# Patient Record
Sex: Female | Born: 1985 | Race: Black or African American | Hispanic: No | Marital: Single | Smoking: Never smoker
Health system: Southern US, Community
[De-identification: ages and names within clinical notes are randomized; demographics above are authoritative.]

## PROBLEM LIST (undated history)

## (undated) DIAGNOSIS — E119 Type 2 diabetes mellitus without complications: Secondary | ICD-10-CM

## (undated) DIAGNOSIS — O26839 Pregnancy related renal disease, unspecified trimester: Secondary | ICD-10-CM

## (undated) DIAGNOSIS — N289 Disorder of kidney and ureter, unspecified: Secondary | ICD-10-CM

## (undated) DIAGNOSIS — K3184 Gastroparesis: Secondary | ICD-10-CM

## (undated) DIAGNOSIS — I10 Essential (primary) hypertension: Secondary | ICD-10-CM

## (undated) DIAGNOSIS — K5900 Constipation, unspecified: Secondary | ICD-10-CM

## (undated) DIAGNOSIS — N63 Unspecified lump in unspecified breast: Secondary | ICD-10-CM

## (undated) DIAGNOSIS — R1012 Left upper quadrant pain: Secondary | ICD-10-CM

## (undated) DIAGNOSIS — Z3483 Encounter for supervision of other normal pregnancy, third trimester: Secondary | ICD-10-CM

## (undated) DIAGNOSIS — O24012 Pre-existing diabetes mellitus, type 1, in pregnancy, second trimester: Secondary | ICD-10-CM

## (undated) DIAGNOSIS — Z302 Encounter for sterilization: Secondary | ICD-10-CM

## (undated) DIAGNOSIS — L7682 Other postprocedural complications of skin and subcutaneous tissue: Secondary | ICD-10-CM

## (undated) DIAGNOSIS — J209 Acute bronchitis, unspecified: Principal | ICD-10-CM

---

## 2005-12-21 NOTE — ED Provider Notes (Signed)
[facility]                      EMERGENCY DEPARTMENT TREATMENT REPORT   NAME:  [name], [name]                        PT. LOCATION:     ER  [phone]   MR #:         BILLING #: [id number]          DOS: 12/21/2005   TIME:   [id number]   cc:    Billey Chang. MARKS, M.D.   Primary Physician:   CHIEF COMPLAINT:  Painful vaginal bumps.   HISTORY OF PRESENT ILLNESS:  This 20 year old black female presents at 1750   complaining of painful bumps in her vagina.  States she has had to have   them drained several times previously; gets them from her diabetes.  She   also has a yeast infection and states she usually gets a pill for that.   She admits she has not seen any regular doctors recently; last saw a doctor   during her pregnancy last year.  Took her blood sugar today prior to   coming; was 94.  She denies abdominal pain, dysuria, chance of pregnancy,   or any other symptoms or injuries.   REVIEW OF SYSTEMS:   CONSTITUTIONAL: No fever, chills, or weight loss.   HEMATOLOGIC/LYMPHATIC: No excessive bruising or lymph node swelling.   RESPIRATORY: No cough, shortness of breath, or wheezing.   GASTROINTESTINAL: No vomiting, diarrhea, or abdominal pain.   GENITOURINARY: No dysuria, frequency, or urgency.   PAST MEDICAL HISTORY:  PAST MEDICAL HISTORY, FAMILY HISTORY, SOCIAL   HISTORY:  All noncontributory, except as above.   ALLERGIES:  None.   PHYSICAL EXAMINATION:   VITAL SIGNS:  BP 142/85, pulse 111, respirations 20, temperature 97.8, pain   8/10 when pushing on the abscess.   GENERAL APPEARANCE:  Patient appears well developed and well nourished.   Appearance and behavior are age and situation appropriate.   GI:  Abdomen soft, nontender, without complaint of pain to palpation.  No   hepatomegaly or splenomegaly.   No abdominal or inguinal masses appreciated by inspection or palpation, no   CVA tenderness or distention.   GU:  Exam done with Sheliah Mends shows a grape-sized abscess between the right    labia majora and labia minora which is exclusively tender and fluctuant.   There is also a resolving abscess on the left labia majora which is   nontender.  There is no surrounding cellulitis.  There is diffuse candida   infection of the perineal, rectal, and vaginal areas.  No spread further   than that.   Judgment appears appropriate.   Recent and remote memory appear to be intact.   PSYCHIATRIC:  Oriented to time, place and person.  Mood and affect   appropriate.   CONTINUATION BY:   DIANA HOULE,   PROCEDURE NOTE:  The labia cleansed with Betadine.  Right inner labia   _____.  There is an abscess right inner labia and left outer labia, both   anesthetized with 1% Xylocaine.  Incision made and purulent drainage   expressed.  The patient still has a cyst left labia.  Small amount of pus   expressed on the left labia.  Packing placed.  Dressing placed.   COURSE IN THE EMERGENCY DEPARTMENT:  The wound was incised and drained; and  she was placed on Bactrim DS, Diflucan, and Vicodin for pain.  She was   given Dr. Dawna Part for follow up and will return if worsening in the meantime.   DIAGNOSES:   1. Incision and drainage vaginal abscesses.   2. Vaginal candidiasis.   3. Non-insulin-dependent diabetes mellitus.   DISPOSITION:  The patient is discharged home in stable condition, with   instructions to follow up with their regular doctor.  They are advised to   return immediately for any worsening or symptoms of concern.   Electronically Signed By:   Imogene Burn, M.D. 12/25/2005 22:45   ____________________________   Imogene Burn, M.D.   My signature above authenticates this document and my orders, the final   diagnosis(es), discharge prescription(s) and instructions in the [facility] record.   st  D:  12/21/2005  T:  12/23/2005  6:13 A   [id number]   st  D:  12/21/2005  T:  12/23/2005  6:13 A   [id number]   DIANA A HOULE, PA-C

## 2006-04-29 NOTE — ED Provider Notes (Signed)
[facility]                      EMERGENCY DEPARTMENT TREATMENT REPORT   NAME:  [name], [name]              PT. LOCATION:    ER  ER08       DOB:  [birth year]                                                                     AGE:  21   MR #:       BILLING #:           DOS: 04/29/2006  TIME: 9:03 P   SEX:  F   96-29-52    [id number]   cc:   Primary Physician:   [facility]   Time seen 2144.   CHIEF COMPLAINT:  Abdominal pain.   HISTORY OF PRESENT ILLNESS:  A very pleasant 21 year old female presents   stating that for the past 5 days she has been having a lower abdominal pain   that radiates to her lower back bilaterally.  She states the pain comes and   goes, but she comes in today because it has been constant all day.  Patient   states that she had a period in March, cannot recall the exact day, she is   sexually active and uses condoms.  She has had no fevers or chills, no   nausea, vomiting, and diarrhea, and no vaginal discharge.  No dysuria or   polyuria.  She has never had pain like this before.  Her gynecologist is   Dr. Doloris Hall.  She did eat dinner.   REVIEW OF SYSTEMS:   CONSTITUTIONAL: No fevers or chills.   EYES: No visual symptoms.   ENT: No sore throat, runny nose or other URI symptoms.   RESPIRATORY:  No cough, shortness of breath, or wheezing.   CARDIOVASCULAR:  No chest pain, chest pressure, or palpitations.   GI:  Lower abdominal cramping for 5 days that comes and goes, but it has   been constant today with no associated nausea, vomiting, or diarrhea.   GENITOURINARY:  No dysuria, frequency, or urgency.   MUSCULOSKELETAL:  Low back pain.   INTEGUMENTARY:  No rashes.   NEUROLOGICAL:  No headaches, sensory or motor symptoms.   PSYCHIATRIC:  No suicidal or homicidal ideation.   PAST MEDICAL HISTORY:  Includes insulin dependent diabetes, abscesses.   SOCIAL HISTORY:  Here alone.  Doesn't smoke or drink.   FAMILY HISTORY:  Noncontributory.    ALLERGIES:  Penicillin.   MEDICATIONS;  Humalog.   PHYSICAL EXAMINATION:   VITAL SIGNS:  Blood pressure 132/80, pulse 99, respirations 18, temperature   98.3, O2 saturation 100% on room air, pain 7/10.   GENERAL APPEARANCE:  The patient appears well developed and well nourished.   Appearance and behavior are age and situation appropriate.   HEENT:  Mouth/Throat:  Surfaces of the pharynx, palate, and tongue are   pink, moist, and without lesions.   NECK:  Supple, symmetrical, trachea midline.   LYMPHATICS:  No cervical or submandibular lymphadenopathy palpated.   RESPIRATORY:  Clear and equal breath sounds.  No respiratory distress,  tachypnea, or accessory muscle use.   HEART:  Regular rate and rhythm.   GI:  Abdomen is soft with discomfort across the lower abdomen.  There is no   rebound or guarding.   GU:  External genitalia without swelling, lesions, or discharge.  No   urethral swelling or discharge.  She does have some discharge to the   vaginal vault.  She does complain of pain on cervical motion.  There is no   adnexal mass or tenderness noted.   MUSCULOSKELETAL:  Stance and gait appear normal.   SKIN:  Warm and dry without rashes.   PSYCHIATRIC:  Recent and remote memory appear to be intact.   NEUROLOGICAL:  No focal deficits.   ADDENDUM BY NURSE HART   I attempted to contact the patient to make aware that she was positive for   chlamydia although treated during her emergency room visit with   doxycycline.  Will send certified letter to inform her of her diagnosis and   that she needs to continue the course of medication and to inform her   sexual partner of her diagnosis.   CONTINUED BY DR. Barry Dienes:   EMERGENCY DEPARTMENT COURSE:  The patient remained stable.  ____ ordered.   I had an extensive discussion with the patient regarding her possible   etiology symptoms, a pelvic was performed and recheck of her examination   showed a continued discomfort in the suprapubic region but no McBurney's,    Murphy's, Rovsing sign noted, no rebound, rigidity or guarding.  The   patient was given Rocephin 200 mg IM.   DIAGNOSTICS:  Wet prep had no yeast, no trichomonas.  Culture showed a   positive chlamydia but no neisseria.  Urine sent for diagnostics.   IMPRESSION:  Pelvic pain, probable pelvic inflammatory disease.   DISPOSITION:  The patient discharged home, given Vicodin for discomfort as   well as doxycycline for probable pelvic inflammatory disease, was given   Rocephin IM prior to discharge.   Drink plenty of fluids. Follow up primary care Jaimes Eckert, call in 48 hours   for culture results.  No intercourse until symptoms resolve and cultures   noted negative.  Otherwise return if worsening or further concerns.   Electronically Signed By:   Lawrence Marseilles, M.D. 05/03/2006 18:12   ____________________________   Lawrence Marseilles, M.D.   My signature above authenticates this document and my orders, the final   diagnosis(es), discharge prescription(s) and instructions in the [facility] record.   sl  D:  04/29/2006  T:  04/30/2006  8:39 A   [id number]   Salem Caster, PA-C

## 2006-06-24 NOTE — ED Provider Notes (Signed)
[facility]                      EMERGENCY DEPARTMENT TREATMENT REPORT   NAME:  [name], [name]              PT. LOCATION:    ER  ER46       DOB:  [birth year]                                                                     AGE:  20   MR #:       BILLING #:           DOS: 06/24/2006  TIME:          SEX:  F   08-49-88    [id number]   cc:   CHIEF COMPLAINT:  Right knee pain.   HPI:  The patient states that 1 year ago she injured the right knee.  She   says she sprained the knee and had x-rays done which were negative.  She   said that she has been having problems occasionally since then.  She had   pain a couple weeks ago then she noticed some swelling in between that, and   she has had pain the last couple days.  She had swelling yesterday as well,   but could not get a ride here.  She called out of work.  She has no trauma   to the area.  She denies any twisting injury.  She has not done any   running.  She said it just started swelling up.  The swelling is gone   today.  She has not taken any medication over the counter.  Never followed   up with an orthopedist.   REVIEW OF SYSTEMS:   CONSTITUTIONAL: No fever, chills, or weight loss.   MUSCULOSKELETAL:  As above.   INTEGUMENTARY: No rashes.   NEUROLOGICAL: No headaches, sensory or motor symptoms.   PAST MEDICAL HISTORY:  Diabetes and history of frequent abscesses.   MEDICATIONS:  Humalog.   ALLERGIES:  Penicillin.   PHYSICAL EXAMINATION:   GENERAL APPEARANCE:  Patient appears well developed and well nourished.   Appearance and behavior are age and situation appropriate.  This is a   well-appearing 21 year old.   MUSCULOSKELETAL:  The patient is nontender to any bony portion of the knee.   She has good flexion/extension of the knee and 2+ dorsalis pedis pulses.   Negative McMurray.  No valgus or varus _____ stress or pain.  No joint line   pain.   SKIN:  Warm and dry with no abrasions or lacerations.    NEUROLOGIC:  Normal sensation to light touch.   VITAL SIGNS:  Blood pressure is 141/93, pulse 94, respirations 20, and   temperature 98.  Pulse oximetry is 98% on room air.  Pain is 6/10 to 7/10.    INITIAL ASSESSMENT:  This is a well-appearing 21 year old with right knee   pain.  It has been intermittent for the past couple of months.  We will go   ahead and treat her with conservative management and have her follow up   with orthopedist.   DIAGNOSES:  Evaluation of right knee pain.  DISPOSITION:  The patient discharged home.  Told to rest, ice, and elevate.   Take Naprosyn or Motrin over the counter.  Follow up with the orthopedist   or Dr. Sabino Donovan for persistent pain.  Return for any new concerns.   ATTENDING NOTE: increasing knee pain after a fall last year. tender   medially.  Stable. No effusion.  No other complaints.   Electronically Signed By:   Shanna Cisco, M.D. 06/28/2006 11:23   ____________________________   Shanna Cisco, M.D.   My signature above authenticates this document and my orders, the final   diagnosis(es), discharge prescription(s) and instructions in the [facility] record.   ST  D:  06/24/2006  T:  06/25/2006  6:10 A   [id number]   [name] EADS, PA-C

## 2006-08-24 NOTE — ED Provider Notes (Signed)
[facility]                      EMERGENCY DEPARTMENT TREATMENT REPORT   NAME:  [name], [name]            PT. LOCATION:    ER  ZO10       DOB:  [birth year]                                                                     AGE:  20   MR #:       BILLING #:           DOS: 08/24/2006  TIME:10:03 P   SEX:  F   96-04-54    [id number]   cc:   Primary Physician:   Primary Physician: [facility]   TIME SEEN: 2140.   CHIEF COMPLAINT:  Knee and ankle injury.   HISTORY OF PRESENT ILLNESS:  This is a 21 year old female who last night   said she was running in heels when she slipped, twisted her knee and ankle.   She has had pain to weight bear ever since.  Does relate that her knee has   bothered her off and on for over a year since the fall prior, but has been   worse in the last 24 hours.  She has not had any other injury or complaint   or concern.   REVIEW OF SYSTEMS   CONSTITUTIONAL: No fever or chills.   MUSCULOSKELETAL:  See HPI.   NEUROLOGICAL:  No paresthesias.   PAST MEDICAL HISTORY:  Includes diabetes.   SOCIAL HISTORY:  Is here with a friend.   MEDICATIONS:  Humalog.   ALLERGIES:   Penicillin.   PHYSICAL EXAMINATION:   GENERAL APPEARANCE:  The patient appears well developed and well nourished.   Appearance and behavior are age and situation appropriate.   VITAL SIGNS:  Blood pressure 107/69, pulse 98, respirations 16, temperature   98.9, 100% on room air, 8/10 pain.   HEENT:  Pupils equal, symmetrical and normally reactive.   EXTREMITIES: Lower extremities:  Patient's right knee is without effusion.   Can flex and extend fully, nontender over the patella and lateral joint   line.  The patient complains of some medial soft tissue tenderness.  She is   nontender over the proximal fibula and tibia.    Ankle:  She has tenderness   over the lateral malleolus, nontender over the medial malleolus and   anterior ankle, nontender over the calcaneus and Achilles, nontender over    the calf.  Foot has some tenderness over the proximal and 5th metatarsal   without swelling, nontender over the remainder of the foot dorsally.  She   moves the toes and ankles with full range of motion.  She is distally,   neurovascularly intact.  She is awake, alert and oriented.   INITIAL ASSESSMENT AND MANAGEMENT PLAN: The patient after a fall yesterday   is having pain to her knee, ankle and foot.  At this time, we are going to   obtain some x-rays to rule out any acute abnormality.  The patient denies   pregnancy.   DIAGNOSTIC STUDIES:  Films of the right  ankle, right foot and right knee   were interpreted as negative by Dr. Acquanetta Chain.   FINAL DIAGNOSES:   1.  Right ankle sprain.   2.  Right knee sprain.   DISPOSITION: Home, placed in an air splint and crutches.  Advised follow   up, Motrin, ice, rest and should return for any new or worsening concerns   and follow up as an out patient if not improving in the next week.  The   patient is discharged home in stable condition, with instructions to follow   up with their regular doctor.  They are advised to return immediately for   any worsening or symptoms of concern.   Electronically Signed By:   Acquanetta Chain, M.D. 08/28/2006 21:44   ____________________________   Acquanetta Chain, M.D.   My signature above authenticates this document and my orders, the final   diagnosis(es), discharge prescription(s) and instructions in the [facility] record.   REW  D:  08/24/2006  T:  08/25/2006  3:06 A   [id number]   ERIN E ICENBICE

## 2006-10-15 NOTE — ED Provider Notes (Signed)
[facility]                      EMERGENCY DEPARTMENT TREATMENT REPORT   NAME:  HOLLE, SPRICK           PT. LOCATION:      ER  ER31          DOB:                                                                         AGE:   MR #:      BILLING #:           DOA:  10/15/2006   DOD:              SEX:  F   [birth year]   [id number]   cc:   Primary Physician: [facility]   TIME SEEN:  [phone]   CHIEF COMPLAINT:   Left-sided pain and fever.   HISTORY OF PRESENT ILLNESS:  Rollen Sox is a 21 year old female who is a type 1   diabetic, states that as of 2 or 3 days ago, she began having some ache in   her left side and back, also admits to pain with urination, has not noted   any blood in her urine.  She has been nauseated.  Denies any vomiting.  Has   had normal bowel movements.   REVIEW OF SYSTEMS:   CONSTITUTIONAL:  As per history of present illness.   EYES: No visual symptoms.   ENT: No sore throat, runny nose or other URI symptoms.   ENDOCRINE:  No diabetic symptoms.   RESPIRATORY:  No cough, shortness of breath, or wheezing.   CARDIOVASCULAR:  No chest pain, chest pressure, or palpitations.   GASTROINTESTINAL:  As per history of present illness.   GENITOURINARY:  As per history of present illness.   MUSCULOSKELETAL:  No joint pain or swelling.   INTEGUMENTARY:  No rashes.   NEUROLOGICAL:  No headaches, sensory or motor symptoms.   PAST MEDICAL HISTORY:  Significant for type 1 diabetes and multiple   abscesses.   PAST SURGICAL HISTORY:  C-section.   PSYCHIATRIC HISTORY:  No previous psychiatric history.   SOCIAL HISTORY:  Denies alcohol, tobacco, and drug use.  Lives with others.   FAMILY HISTORY:  Noncontributory.   ALLERGIES:   Penicillin.   MEDICATIONS:   Humalog pump.   PHYSICAL EXAMINATION:   VITAL SIGNS:  Blood pressure 98/66, pulse 116, respirations 20, temperature   99.3, O2 saturation 100% on room air, and pain 9/10.    GENERAL APPEARANCE:  The patient appears well developed and well nourished.   Appearance and behavior are age and situation appropriate.   HEENT: Eyes:  Conjunctivae clear, lids normal.  Pupils equal, symmetrical,   and normally reactive.   RESPIRATORY:  Clear and equal breath sounds.  No respiratory distress,   tachypnea, or accessory muscle use.   CARDIOVASCULAR:  Heart:  Regular without murmurs or rubs.   CHEST:  Chest symmetrical without masses or tenderness.   GASTROINTESTINAL:  Abdomen:  Soft, mildly tender to palpation in the left   mid quadrant.  Nondistended.  No rebound.  Negative Rovsing.  MUSCULOSKELETAL: Stance and gait appear normal. Nails:  No clubbing or   deformities.  Nail beds pink with prompt capillary refill.   SKIN:  Warm and dry without rashes.   NEUROLOGIC:  Alert, oriented.  Sensation intact, motor strength equal and   symmetric.   DIAGNOSTIC STUDIES:  Urinalysis showed greater than 1000 glucose, greater   than 80 ketones, moderate blood, trace leukocyte esterase, and 30-49 white   blood cells per high-powered field.  CBC, which showed a white count of   25.4.  Urine culture is pending.   COURSE IN THE EMERGENCY DEPARTMENT:   The patient received normal saline, 1   liter bolus, and began with IV antibiotics, a gram of Rocephin.   DIAGNOSIS:   Pyelonephritis.   DISPOSITION/PLAN:  The patient is being assigned to the observation unit   for IV antibiotics overnight.   Electronically Signed By:   Stormy Card, M.D. 10/26/2006 22:03   ____________________________   Stormy Card, M.D.   My signature above authenticates this document and my orders, the final   diagnosis(es), discharge prescription(s) and instructions in the [facility] record.   TW  D:  10/15/2006  T:  10/15/2006 10:08 A   [id number]   KARI TOWNS, PA-C

## 2006-10-16 NOTE — H&P (Signed)
[facility]                              HISTORY AND PHYSICAL                               Zachery Dauer, M.D.   NAME:    [name] [name], [name] [name]   MR #:    [id number]                  ADM DATE:          10/15/2006   BILLING  [id number]                 PT. LOCATION       [id number]   #:   SS #     [id number]               DOB:  1985/05/25   AGE:  21   Zachery Dauer, M.D.                 SEX:  F   cc:    Zachery Dauer, M.D.   *[facility]   CHIEF COMPLAINT:  "Left flank pain."   HISTORY OF PRESENT ILLNESS:  This is a 21 year old female with a history of   diabetes mellitus for 3 or 4 years who was placed in the emergency room   observation unit overnight after she was admitted for pyelonephritis.  The   patient was thought to continue to have a fever and still had an elevated   white count and was subsequently admitted for further management.  In   discussion with the patient, the patient reports that she had been having   left flank pain which she describes as a constant, sharp pain that was 9/10   in intensity.  There was no exacerbation or relieving factors.  The patient   had fever and chills.  No hematuria.  She had a decreased appetite but no   nausea or vomiting.  The patient was also having 2-3 times nocturia.  She   reported that she was on an insulin pump but has not been using it since   about 3 or 4 weeks because it was broken and she sent it back to the   manufacturer.   PAST MEDICAL HISTORY:  Diabetes mellitus, insulin dependent for 3 or 4   years.   ALLERGIES:  Penicillin which causes hives.   PAST SURGICAL HISTORY:  C-section x 1.   MEDICATIONS:  As mentioned earlier, the patient was supposed to have an   insulin pump but has not been using it for 3 to 4 weeks.  She does use   Humalog sliding scale.   FAMILY HISTORY:  There is a history of myocardial infarction in the   patient's great-grandmother but no cerebrovascular accident or diabetes    mellitus in any other family members.   SOCIAL HISTORY:  She lives with her grandmother.  She has 1 child who is 19   months.  She completed 1 year of college and currently is in cosmetology   school.  She does not smoke or drink and denies any drug use.   REVIEW OF SYSTEMS:   CONSTITUTIONAL:  No weight loss or gain.   HEENT:  No tinnitus, otalgia, sinus pressure, visual changes, or sore   throat.   NECK:  No stiffness or pain.   RESPIRATORY:  No shortness of breath.   CARDIOVASCULAR:  No chest pain or palpitations.   GASTROINTESTINAL:  No diarrhea, melena, or hematochezia.   GENITOURINARY:  The patient does not have any dysuria or hematuria.  She   reports that her last normal menstrual period ended on September 30, 2006.   NEUROLOGICAL:  No paresthesias or headaches.   MUSCULOSKELETAL:  No muscle weakness or pain.   ENDOCRINE:  No cold or heat intolerance.   PHYSICAL EXAMINATION:   GENERAL:  The patient is resting comfortably in bed.  She is in no acute   distress.   VITAL SIGNS:  Temperature 100.6, pulse 104, respiratory rate 20, blood   pressure 102/50, weight 51 kilograms, height 5 feet 5 inches.   HEENT:  Atraumatic, normocephalic.  Pupils equal, round, and reactive to   light and accommodation.  Extraocular muscles are intact.  Tympanic   membranes are clear.  Oropharynx was clear.   NECK:  Supple, no jugular venous distention.   RESPIRATORY:  Lungs are clear to auscultation bilaterally.   CARDIOVASCULAR:  Normal S1 and S2.  No murmur appreciated.   GASTROINTESTINAL:  Bowel sounds are present, soft, the patient does not   have any guarding or rebound tenderness.  She does have a left   costovertebral angle tenderness.   EXTREMITIES:  No cyanosis or clubbing.  The patient has trace bilateral   lower extremity edema.   NEUROLOGICAL:  Cranial nerves II-XII are grossly intact.  Strength is equal   bilaterally. Reflexes are equal bilaterally.    The patient's labs on admission to the emergency room observation unit:   Sodium 129, potassium 4.1, chloride 97, CO2 19, glucose 470, BUN 10,   creatinine 1.1, calcium 9.2, lipase 49.  WBC 25.4, hemoglobin 12.7,   hematocrit 37.4, platelets 243.  Urinalysis:  Cloudy, greater than 1000   glucose, greater than 80 ketones, moderate blood, trace leukocyte esterase,   32-49 wbc's.  Hemoglobin A1C 12.8.   ASSESSMENT AND PLAN:   1. Pyelonephritis.  The patient was started on Rocephin in the emergency      room.  We will continue that for now.  We will also add Levaquin to her      regimen for now.  We will monitor her temperature curve.  We will also      adequately rehydrate the patient.   2. Uncontrolled diabetes mellitus.  This is purely from noncompliance with      her medications as well as the patient does not have a true physician      management of her diabetes.  She is unable to recall the last time that      she was seen at [facility].  So in discussion with her, I truly      believe that the patient is not serious about her diabetes.  I had      extensive counseling, stressing to her the risks of continuing      noncompliance with her treatment, including loss of limb and subsequent      death due to severely uncontrolled diabetes and possible diabetic      ketoacidosis.  The patient was placed on an insulin drip in the      emergency room which I at this time I do not believe that she will      continue to need that as she does not have any significant anion gap.  I  would like to discontinue the insulin drip and place her on Lantus as      well as cover her with a sliding scale Humalog.  We will change her      Accu-Chek to before meals and at bedtime and monitor her closely.   3. Oral thrush.  We will start an nystatin swish and swallow.   4. Deep venous thrombosis prophylaxis.  We will start Lovenox 40 mg      subcutaneous daily   Electronically Signed By:   Zachery Dauer, M.D. 10/17/2006 10:41    ____________________________   Zachery Dauer, M.D.   CD  D:  10/16/2006  T:  10/16/2006  7:18 P   [id number]

## 2006-10-16 NOTE — ED Provider Notes (Signed)
[facility]                      EMERGENCY DEPARTMENT TREATMENT REPORT   NAME:  [name] [name], [place]             PT. LOCATION:      ERO [id number]          DOB:                                                                         AGE:   MR #:      BILLING #:           DOA:  10/15/2006   DOD:              SEX:  F   08-49-88   [id number]   cc:   Primary Physician:   Date of assignment:  10/15/06 at 1015 hours   Date of admission:  10/16/06 at 1125 hours   SUMMARY:  The patient is a 21 year old insulin dependent diabetic assigned   to the observation unit for treatment with IV insulin, fluids and   antibiotics for pyelonephritis.  Overnight she continue to spike   temperatures.  Repeat evaluation at 1030 hours the patient was still   febrile, a moderate amount of left flank pain, was tolerating liquids.   CLINICAL IMPRESSION:   1.  Pyelonephritis.   2.  Metabolic acidosis.   3.  Intractable pain and fever.   DISPOSITION:  After consulting Dr. Lamar Sprinkles, the patient was admitted to   continue fluids and insulin, Rocephin and add Levaquin.   Electronically Signed By:   Thornton Dales, M.D. 10/20/2006 14:42   ____________________________   Thornton Dales, M.D.   My signature above authenticates this document and my orders, the final   diagnosis(es), discharge prescription(s) and instructions in the [facility] record.   [facility]  D:  10/16/2006  T:  10/16/2006 12:59 P   [id number]

## 2006-10-19 NOTE — Discharge Summary (Signed)
[facility]                                DISCHARGE SUMMARY   NAME:  [name] [name], [name] [name]   MR #:  [id number]                      ADM DATE:   10/15/2006   Tiburcio Bash [id number]                     DIS DATE:     10/19/2006   G#   SS #   [id number]                   DOB:  09/08/1985   Zachery Dauer, M.D.                   AGE:21                   SEX:  F   cc:    Zachery Dauer, M.D.          Karren Burly Doneen Poisson, M.D.   PRINCIPAL DIAGNOSES:   1.     Pyelonephritis.   2.     Uncontrolled diabetes.   3.     Hypomagnesemia.   4.     Oral thrush.   Please see my history and physical for detailed history of present illness   and physical examination.   HISTORY OF PRESENT ILLNESS:  This is a 21 year old female with a history of   diabetes mellitus for 3 to 4 years who was actually placed in the emergency   room observation unit overnight after she was admitted for pyelonephritis.   The patient continued to have fever and elevated white count and was   subsequently admitted for further management.  In discussing with patient,   the patient reports that she had been having left flank pain which she   describes as constant, sharp pain that was 9/10 in intensity.  There were   no exacerbating or alleviating factors.  She had fever and chills.  No   hematuria.  She also has been having decreased appetite but no nausea or   vomiting.  She reports 2 to 3 times nocturia.  She apparently was on   insulin pump, which she has not been using for about 3 or 4 weeks.  She   reports that she sent it back to the manufacturer because it was broken.   PERTINENT LABORATORY FINDINGS:  Blood cultures were negative.  Urine   culture grew out E. coli.  Hemoglobin A1c was 12.8.  On initial admission,   WBC was 25.4, hemoglobin 12.9, hematocrit 37.4, platelet 243,000, sodium   129, potassium 4.1, chloride 97, CO2 19, glucose 417, BUN 10, creatinine   1.1, lipase was 49, and magnesium 1.5.    HOSPITAL COURSE:  She was admitted with pyelonephritis as well as   uncontrolled diabetes mellitus.  At the emergency room, the patient was   started on Rocephin and on admission Levaquin was added.  Culture of her   urine grew out Escherichia coli, which was sensitive to Levaquin.  The   patient also subsequently defervesced and so Rocephin was discontinued and   the patient was continued on oral Levaquin, which she is to complete a 7   day course.   The patient has a uncontrolled diabetes mellitus.  While she was in the   emergency room, she was started on insulin drip, which was immediately   discontinued and the patient was placed on Lantus with significant   improvement in the blood sugar.  I have extensive discussion with the   patient concerning the risk of uncontrolled diabetes.  She voice   understanding.  She was also given information to follow up with Dr. Ann Held as outpatient.  We will continue her on Lantus as well as her   having a sliding scale Humalog.   The patient's magnesium was also low which was replaced.   On examination, the patient had oral thrush, which is likely from   uncontrolled diabetes mellitus.  She was placed on Nystatin swish and   swallow, which she is to complete 5-day course.   The rest of the hospital course was uneventful.  She is being discharged   home in stable condition.  She is to follow up with Dr. Ann Held   preferably in the next 3 or 4 weeks.  She is also to follow up with his   usual care site at [facility] within the next 3 weeks.  She does not   have a specific physician that she sees at [facility], which I have   instructed her to pick a physician that would help her to keep her diabetes   under control.  She is to maintain 1800-calorie ADA diet and activities as   tolerated.   DISCHARGE MEDICATIONS:   1.     Humalog sliding scale.   2.     Lantus 30 units subcutaneous q.a.m.   3.     Levaquin 500 mg daily.    4.     Nystatin swish and swallow 5 mL 4 times a day.   Total time was 27 minutes.   Electronically Signed By:   Zachery Dauer, M.D. 10/24/2006 08:52   ________________________________   Zachery Dauer, M.D.   TS1  D:  10/19/2006  T:  10/19/2006  2:15 P   [id number]

## 2006-12-14 NOTE — ED Provider Notes (Signed)
[facility]                      EMERGENCY DEPARTMENT TREATMENT REPORT   NAME:  [name], [name]           PT. LOCATION:      ER  [phone]          DOB:                                                                         AGE:   MR #:      BILLING #:           DOA:  12/14/2006   DOD:              SEX:  F   08-49-88   [id number]   cc:   Primary Physician:   TIME OF EVALUATION:  1110.   CHIEF COMPLAINT:  Cuts on vaginal area.   HISTORY OF PRESENT ILLNESS:  [name] [name] is a 21 year old female who presents   with severe pain around her vaginal area that she states feels like she has   been cut.  She denies any injury.  Denies any abuse of any kind.  States   the pain began about 2 days ago.  Admits to being sexually active but   states she uses a barrier method every time and also to the fact that she   might have yeast infection.  She has got an abnormal vaginal discharge at   this time.  Denies fever, nausea, vomiting, abdominal pain, or other   complaints.  Denies any previous history of genital herpes.   REVIEW OF SYSTEMS:   CONSTITUTIONAL:  No fever, chills, weight loss.   ENT: No sore throat, runny nose or other URI symptoms.   RESPIRATORY:  No cough, shortness of breath, or wheezing.   CARDIOVASCULAR:  No chest pain, chest pressure, or palpitations.   GASTROINTESTINAL:  No vomiting, diarrhea, or abdominal pain.   GENITOURINARY:  As per history of present illness.   INTEGUMENTARY:  No rashes.   PAST MEDICAL HISTORY:  Significant for type 1 diabetes and abscesses.   PAST SURGICAL HISTORY:  C-section.   PSYCHIATRIC HISTORY:  None.   SOCIAL HISTORY:  Denies alcohol, tobacco, and drug use.   FAMILY HISTORY:  Noncontributory.   ALLERGIES:  Penicillins.   MEDICATIONS:  Humalog.   PHYSICAL EXAMINATION:   VITAL SIGNS:  Blood pressure 136/100, pulse 110, respirations 16,   temperature 98.4, pain 7-8/10.   GENERAL APPEARANCE:  The patient appears well developed and well nourished.    Appearance and behavior are age and situation appropriate.   HEENT:  Mouth/Throat:  Surfaces of the pharynx, palate, and tongue are   pink, moist, and without lesions.   RESPIRATORY:  Clear and equal breath sounds.  No respiratory distress,   tachypnea, or accessory muscle use.   CARDIOVASCULAR:  Heart is regular without murmurs or rubs.   GASTROINTESTINAL:  Abdomen is soft, nondistended, nontender to palpation.   GENITOURINARY:  External genitalia appear to have white salve or powder at   the inferior portion of the vaginal introitus.  There is an ulcerated area.   There do not  appear to be separate discrete lesions at this time, just one   large, ulcerated lesion.  Exquisitely tender to palpation.  The patient   does have a moderate amount of bright red blood in the vaginal vault at   this time, which is consistent with vaginal spotting for her at this time.   Cervical appearance is normal.  No cervical motion tenderness.   SKIN:  Other than the genitalia is warm and dry without rashes.   NEUROLOGIC:  Alert, oriented.  Sensation intact, motor strength equal and   symmetric.   DIAGNOSTIC STUDIES:  STD testing, results of which are pending at this   time, which included herpes culture, RPR and VDRL.  Clinically, the lesions   suggest herpes; but, we are considering syphilis in the diferential as well   since the size of the ulcer is somewhat atypical.   Wet prep showed clue   cells.  Pregnancy test was negative.  Urinalysis showed large blood,   otherwise unremarkable.   DIAGNOSES:   1. Bacterial vaginitis.   2. Evaluation of genital lesions.   DISPOSITION:  The patient is being discharged to home with prescriptions   for Flagyl, Vicodin, and Zovirax.  Instructed to follow up with [facility] Department for further sexually transmitted disease testing.  Return   for any concerns.  The patient is discharged with verbal and written   instructions and a referral for ongoing care.  The patient is aware that    they may return at any time for new or worsening symptoms.   ADDENDUM BY LINDSAY COBURN, PA-C:   The patient seen by Dr. Delton See and Debbora Presto.  Got a call from the   laboratory.  The patient has a positive herpes culture.  I tried to call   the patient and the patient was not home, left a message for them to call   us back   in the ER.   Electronically Signed By:   Marijo Sanes, M.D. 12/17/2006 17:14   ____________________________   Marijo Sanes, M.D.   My signature above authenticates this document and my orders, the final   diagnosis(es), discharge prescription(s) and instructions in the [facility] record.   JJ  D:  12/16/2006  T:  12/16/2006  5:03 P   [id number]   KARI TOWNS, PA-C

## 2006-12-14 NOTE — ED Provider Notes (Signed)
[facility]                      EMERGENCY DEPARTMENT TREATMENT REPORT   NAME:  [name] [name]           PT. LOCATION:      ER  [phone]          DOB:                                                                         AGE:   MR #:      BILLING #:           DOA:  12/14/2006   DOD:              SEX:  F   08-49-88   [id number]   cc:        *[facility]   Primary Physician: [facility]   TIME OF EVALUATION:  1110.   CHIEF COMPLAINT:  Cuts on vaginal area.   HISTORY OF PRESENT ILLNESS:  [name] [name] is a 21 year old female who presents   with severe pain around her vaginal area that she states feels like she has   been cut.  She denies any injury.  Denies any abuse of any kind.  States   the pain began about 2 days ago.  Admits to being sexually active but   states she uses a barrier method every time and also to the fact that she   might have yeast infection.  She has got an abnormal vaginal discharge at   this time.  Denies fever, nausea, vomiting, abdominal pain, or other   complaints.  Denies any previous history of genital herpes.   REVIEW OF SYSTEMS:   CONSTITUTIONAL:  No fever, chills, weight loss.   ENT: No sore throat, runny nose or other URI symptoms.   RESPIRATORY:  No cough, shortness of breath, or wheezing.   CARDIOVASCULAR:  No chest pain, chest pressure, or palpitations.   GASTROINTESTINAL:  No vomiting, diarrhea, or abdominal pain.   GENITOURINARY:  As per history of present illness.   INTEGUMENTARY:  No rashes.   PAST MEDICAL HISTORY:  Significant for type 1 diabetes and abscesses.   PAST SURGICAL HISTORY:  C-section.   PSYCHIATRIC HISTORY:  None.   SOCIAL HISTORY:  Denies alcohol, tobacco, and drug use.   FAMILY HISTORY:  Noncontributory.   ALLERGIES:  Penicillins.   MEDICATIONS:  Humalog.   PHYSICAL EXAMINATION:   VITAL SIGNS:  Blood pressure 136/100, pulse 110, respirations 16,   temperature 98.4, pain 7-8/10.    GENERAL APPEARANCE:  The patient appears well developed and well nourished.   Appearance and behavior are age and situation appropriate.   HEENT:  Mouth/Throat:  Surfaces of the pharynx, palate, and tongue are   pink, moist, and without lesions.   RESPIRATORY:  Clear and equal breath sounds.  No respiratory distress,   tachypnea, or accessory muscle use.   CARDIOVASCULAR:  Heart is regular without murmurs or rubs.   GASTROINTESTINAL:  Abdomen is soft, nondistended, nontender to palpation.   GENITOURINARY:  External genitalia appear to have white salve or powder at   the inferior portion of the vaginal introitus.  There is an ulcerated area.   There do not appear to be separate discrete lesions at this time, just one   large, ulcerated lesion.  Exquisitely tender to palpation.  The patient   does have a moderate amount of bright red blood in the vaginal vault at   this time, which is consistent with vaginal spotting for her at this time.   Cervical appearance is normal.  No cervical motion tenderness.   SKIN:  Other than the genitalia is warm and dry without rashes.   NEUROLOGIC:  Alert, oriented.  Sensation intact, motor strength equal and   symmetric.   DIAGNOSTIC STUDIES:  STD testing, results of which are pending at this   time, which included herpes culture, RPR and VDRL.  Wet prep showed clue   cells.  Pregnancy test was negative.  Urinalysis showed large blood,   otherwise unremarkable.   DIAGNOSES:   1. Bacterial vaginitis.   2. Evaluation of genital lesions.   DISPOSITION:  The patient is being discharged to home with prescriptions   for Flagyl, Vicodin, and Zovirax.  Instructed to follow up with [facility] Department for further sexually transmitted disease testing.  Return   for any concerns.  The patient is discharged with verbal and written   instructions and a referral for ongoing care.  The patient is aware that   they may return at any time for new or worsening symptoms.    Electronically Signed By:   Marijo Sanes, M.D. 12/15/2006 22:42   ____________________________   Marijo Sanes, M.D.   My signature above authenticates this document and my orders, the final   diagnosis(es), discharge prescription(s) and instructions in the [facility] record.   SP1  D:  12/14/2006  T:  12/15/2006  5:45 A   [id number]   KARI TOWNS, PA-C

## 2007-06-02 NOTE — H&P (Signed)
[facility]                              HISTORY AND PHYSICAL                             Janice Coffin, M.D.   NAME:    [name] [name], [name] [name]   MR #:    [id number]                  ADM DATE:          06/04/2007   BILLING  [id number]                 PT. LOCATION   #:   SS #     [id number]               DOB:  [birth year]   AGE:  21   FRED Sima Matas, M.D.             SEX:  F   cc:    Janice Coffin, M.D.   CHIEF COMPLAINT:  "Vaginal bumps"   HISTORY OF PRESENT ILLNESS:  The patient is a 22 year old gravida 1, para 1   currently on combination OCPs, who presents for definitive surgical   management of a condyloma acuminatum of the vulva.  The patient has failed   conservative management using immunogenic topical therapy.  Patient has   been counseled extensively regarding risks, benefits, option of laser   ablation including infection and bleeding, and she agreed to proceed.   PAST MEDICAL HISTORY: Unremarkable.   FAMILY HISTORY: Notable for maternal aunt with diabetes mellitus and grand   maternal cancer of unknown type.   PAST SURGICAL HISTORY:  Noncontributory.   OB/GYN HISTORY:  Age of onset of menarche is approximately 22 years of age   with normal flow q. 20 days of 4-5 days duration.  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   MEDICATIONS: Mircette OCPs.   ALLERGIES:  Penicillin.   REVIEW OF SYSTEMS:  The patient denies any chest pain or shortness of   breath.  She denies any unusual vaginal discharge.  She denies any unusual   bowel problems.   PHYSICAL EXAMINATION:   VITAL SIGNS: Weight 120 pounds, temperature 98.5, blood pressure 100/60,   pulse 80, respirations 20.   HEENT:  Normocephalic, atraumatic.  Extraocular eye motions are full and   intact.   THYROID:  Is without any palpable mass.   NECK: Supple.  Trachea is midline.    LUNGS: Clear to auscultation bilaterally.  Good inspiratory effort.  No   wheezes or crackles noted.   HEART: Rate is normal with normal S1 and S2.  No murmurs appreciated.  No   gallops noted.   BREASTS:  Exam deferred.   ABDOMEN: Soft, nontender, nondistended with normoactive bowel sounds in all   four quadrants.   BACK: Is without costovertebral angle tenderness.   EXTERNAL GENITALIA:  Reveals multiple 1-cm cauliflower lesions at the   posterior fourchette, consistent with condyloma.  Speculum exam revealed   normal appearing cervix without any lesions.  Vaginal mucosa is moist and   well rugated.   EXTREMITIES: Warm and well perfused.  No cyanosis or edema noted.   NEUROLOGIC EXAMINATION:  Patient is alert and oriented x3.  Cranial nerves   II-XII grossly intact.  No focal deficits noted.   IMPRESSION:  Condyloma acuminatum of the vulva.   PLAN: To proceed with laser ablation and routine postop care.   Electronically Signed By:   Janice Coffin, M.D. 06/03/2007 09:14   ____________________________   Janice Coffin, M.D.   JDM  D:  06/02/2007  T:  06/02/2007  6:02 P   [id number]

## 2007-06-03 NOTE — Procedures (Signed)
Test Reason : laser ablation vagina   Blood Pressure : ***/*** mmHG   Vent. Rate : 089 BPM     Atrial Rate : 089 BPM   P-R Int : 132 ms          QRS Dur : 086 ms   QT Int : 358 ms       P-R-T Axes : 082 084 067 degrees   QTc Int : 435 ms   Normal sinus rhythm with sinus arrhythmia   Normal ECG   Early repolarization   No previous ECGs available   Confirmed by Sherral Hammers, M.D., Jomarie Longs [phone]) on 06/03/2007 4:04:15 PM   Referred By:  Loreli Slot           Overread By: Jani Files, M.D.

## 2007-06-03 NOTE — Procedures (Signed)
Test Reason : laser ablation vagina   Blood Pressure : ***/*** mmHG   Vent. Rate : 089 BPM     Atrial Rate : 089 BPM   P-R Int : 132 ms          QRS Dur : 086 ms   QT Int : 358 ms       P-R-T Axes : 082 084 067 degrees   QTc Int : 435 ms   Normal sinus rhythm with sinus arrhythmia   Normal ECG   Early repolarization   No previous ECGs available   Confirmed by Robbins, M.D., Joseph ([id number]) on 06/03/2007 4:04:15 PM   Referred By:  UNK           Overread By: Joseph Robbins, M.D.

## 2007-06-04 NOTE — Op Note (Signed)
[facility]                                OPERATION REPORT                        SURGEON:  FRED A. WILLIAMS, M.D.   NAM [name] [name], [name] [name]   E:   MR  [id number]                         DATE:            06/04/2007   #:   SS  [id number]                      PT. LOCATION:    OR  OR12   #   FRED A. WILLIAMS, M.D.   cc:    FRED A. WILLIAMS, M.D.   PREOPERATIVE DIAGNOSIS:   Vulvar condyloma.   POSTOPERATIVE DIAGNOSIS:   Vulvar condyloma.   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   SURGEON:   Fred A.  Williams, MD   ANESTHESIA:   General endotracheal   ESTIMATED BLOOD LOSS:   Minimal.   FINDINGS:   Multiple condylomatous lesions of the vulva, most notably over the right   labia majora region and some scattered lesions over the left labia minora.   COMPLICATIONS:   None.   INDICATIONS FOR SURGERY:   The patient is 21-year-old who has had a long-standing history of large   condyloma of the vulva who failed topical medical therapy.  The patient   desires management in the form of laser ablation.  The patient was   counseled extensively regarding risks, benefits and options of surgery and   agreed to proceed.   PROCEDURE:   The patient was taken to the operating room where general anesthesia was   found be adequate.  She was placed in dorsal lithotomy position and then   prepped in normal sterile fashion.  A CO2 laser was tested for proper   functioning and all the visible condylomatous lesions were fulgurated.  The   perineum was inspected and noted be hemostatic.  All instruments and lap   counts were correct x2.   DISPOSITION:   The patient tolerated the procedure and was taken to the recovery room in   good condition.   Electronically Signed By:   FRED A. WILLIAMS, M.D. 06/24/2007 09:08   ____________________________   FRED A. WILLIAMS, M.D.   HP  D:  06/19/2007  T:  06/21/2007  5:03 P   [id number]

## 2007-06-19 NOTE — Op Note (Signed)
[facility]                                OPERATION REPORT                        SURGEON:  Janice Coffin, M.D.   [facility], Genella Mech:   MR  [id number]                         DATE:            06/04/2007   #:   [name] [name]  [id number]                      PT. LOCATION:    OR  OR12   #   Janice Coffin, M.D.   cc:    Janice Coffin, M.D.   PREOPERATIVE DIAGNOSIS:   Vulvar condyloma.   POSTOPERATIVE DIAGNOSIS:   Vulvar condyloma.   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   SURGEON:   Fred A.  Mayford Knife, MD   ANESTHESIA:   General endotracheal   ESTIMATED BLOOD LOSS:   Minimal.   FINDINGS:   Multiple condylomatous lesions of the vulva, most notably over the right   labia majora region and some scattered lesions over the left labia minora.   COMPLICATIONS:   None.   INDICATIONS FOR SURGERY:   The patient is 22 year old who has had a long-standing history of large   condyloma of the vulva who failed topical medical therapy.  The patient   desires management in the form of laser ablation.  The patient was   counseled extensively regarding risks, benefits and options of surgery and   agreed to proceed.   PROCEDURE:   The patient was taken to the operating room where general anesthesia was   found be adequate.  She was placed in dorsal lithotomy position and then   prepped in normal sterile fashion.  A CO2 laser was tested for proper   functioning and all the visible condylomatous lesions were fulgurated.  The   perineum was inspected and noted be hemostatic.  All instruments and lap   counts were correct x2.   DISPOSITION:   The patient tolerated the procedure and was taken to the recovery room in   good condition.   Electronically Signed By:   Janice Coffin, M.D. 06/24/2007 09:08   ____________________________   Janice Coffin, M.D.   [name]  D:  06/19/2007  T:  06/21/2007  5:03 P   [id number]

## 2007-11-12 NOTE — ED Provider Notes (Signed)
[name] [place]                      EMERGENCY DEPARTMENT TREATMENT REPORT   NAME:  [name] [name], [name] [name]              PT. LOCATION:    ER  ER03       DOB:  [birth year]                                                                     AGE:  22   MR #:       BILLING #:           DOS: 11/12/2007  TIME: 7:01 A   SEX:  F   [birth year]    [id number]   cc:    Wynell Balloon, M.D.   Primary Physician:   CHIEF COMPLAINT:  Laceration to left middle finger.   HISTORY OF PRESENT ILLNESS:  This is a 22 year old diabetic type 1 female   who comes in with a laceration to her left 3rd finger that she sustained   after cutting apples at work yesterday.  She states that the knife that she   used was serrated and that she did not come in initially because she just   continued to work.  She denies any other alleviating or aggravating   factors.   REVIEW OF SYSTEMS:   CONSTITUTIONAL: No fever, chills, or weight loss.   MUSCULOSKELETAL: No joint pain or swelling.   INTEGUMENTARY:  Laceration to left 3rd finger.   PAST MEDICAL HISTORY:  Diabetes type 1.   SURGICAL HISTORY:  C-section.   SOCIAL HISTORY:  Denies tobacco, alcohol, and drug use.   CURRENT MEDICATIONS:  Listed and reviewed in IBEX.   KNOWN ALLERGIES:  Penicillin.   PHYSICAL EXAMINATION:   VITAL SIGNS:  Blood pressure is 126/82, pulse 116, respirations 18,   temperature 98.7, pain 5, and O2 saturation 99% on room air.   GENERAL APPEARANCE:  Patient appears well developed and well nourished.   Appearance and behavior are age and situation appropriate.   MUSCULOSKELETAL:  The patient has excellent range of motion of her left 3rd   finger.  Sensation is intact.  Radial pulse is 2+.   SKIN:  There appears to be an approximately 1 cm laceration in a U-shaped   fashion on the volar surface of the patient's 3rd finger.  It appears to be   superficial and healing.   INITIAL ASSESSMENT/MANAGEMENT PLAN:  This is a 22 year old diabetic type 1    female with a laceration to her left 3rd finger.  The laceration has   already started to heal.  It has been greater than 12 hours since the   injury so we will not suture it.  However, we will recommend that she keeps   it clean and places bacitracin on it.  We will go ahead and treat her   prophylactically for infection with some Keflex.   CLINICAL IMPRESSION/DIAGNOSIS:  Laceration, left middle finger, no repair.   DISPOSITION/PLAN:  The patient was discharged home in stable condition with   instructions on laceration and general wound care.  Follow up with her   primary care physician if not improving  or return to the ER if condition   worsens or new symptoms develop.   Electronically Signed By:   Jerilynn Som, M.D. 12/02/2007 00:28   ____________________________   Jerilynn Som, M.D.   Dictated by:  Alva Garnet, PA   My signature above authenticates this document and my orders, the final   diagnosis(es), discharge prescription(s) and instructions in the [facility] record.   ST  D:  11/12/2007  T:  11/14/2007  8:21 P   [id number]

## 2008-02-22 NOTE — ED Provider Notes (Signed)
[facility]                      EMERGENCY DEPARTMENT TREATMENT REPORT   NAME:  VELMER, BROADFOOT                      PT. LOCATION:     ER  [phone]   MR #:         BILLING #: [id number]          DOS: 02/22/2008   TIME: 2:14 A   11-91-47   cc:    Wynell Balloon, M.D.   PRIMARY CARE PHYSICIAN   Dr. Darl Pikes Kim-Foley   Time Seen   [phone] on 02/23/2008.   CHIEF COMPLAINTPain from hips down.   HISTORY OF PRESENT ILLNESS   This 23 year old female who is an insulin-dependent diabetic on insulin   pump presents complaining of pain in her legs that begins in her hips and   travels down the entire leg bilaterally x2 weeks with no history of trauma.   No history of back pain.  No bowel or bladder incontinence.  No weakness or   paresthesias.  She states that her blood sugar is typically very well   controlled.  She has no history of any autoimmune disorders in herself or   her family.  She has never been on steroids for any prolonged period of   time.  She did have steroids back in 2007 x2 doses for premature birth of   her baby.   REVIEW OF SYSTEMS   CONSTITUTIONAL:  No fevers, chills, weight loss.   MUSCULOSKELETAL: Bilateral leg pain.   GU:  No incontinence.   NEUROLOGICAL:  No weakness or paresthesias.   PAST MEDICAL HISTORY   Insulin dependent diabetes, C-section.   SOCIAL HISTORY   Here with parent.   FAMILY HISTORY   Noncontributory.   ALLERGIES   Penicillin.   MEDICATIONS   Humalog and Valtrex.   PHYSICAL EXAMINATION   VITAL SIGNS:  Blood pressure 121/71, pulse 96, respiratory rate 16,   temperature 97.9, O2 saturation is 100% on room air.  Pain 9/10.   GENERAL APPEARANCE:  The patient appears well developed and well nourished.   Appearance and behavior are age and situation appropriate.   LOWER EXTREMITIES:  No swelling, ecchymosis, bruising or deformity.  She   has very thin legs.  None of her joints are red, hot or swollen.  Not    particularly tender to touch over the hips, femurs, knees, tibias, fibulas,   or feet.  Pedis pulses are intact.  Strength 5/5 equally and bilaterally.   INITIAL ASSESSMENT AND MANAGEMENT PLAN   A 23 year old female presents with bilateral leg pain.  We will check a   pelvis x-ray to rule out any abnormalities. We will also check an i-STAT.   DIAGNOSTIC STUDIES   The patient's pelvis was read by Dr. Luciano Cutter as no acute abnormalities.  The   patient's i-STAT returned with a low glucose of 41.  She was given a meal   at this time.   DIAGNOSIS   Bilateral leg pain.   PLAN   1. The patient is discharged home in stable condition, with instructions to      follow up with their regular doctor.  They are advised to return      immediately for any worsening or symptoms of concern.   2. The patient is to start taking over-the-counter Motrin  for pain.  May      also take Vicodin.  She was written a prescription for 16.  She may      return if new or worsening symptoms.  She is to follow up with Dr.      Cleda Daub for further evaluation.   Electronically Signed By:   Mickie Kay, M.D. 04/03/2008 00:48   ____________________________   Mickie Kay, M.D.   Dictated by Salem Caster, P.A.   My signature above authenticates this document and my orders, the final   diagnosis(es), discharge prescription(s) and instructions in the [facility] record.   MW  D:  02/23/2008  T:  02/24/2008 11:14 A   [id number]

## 2008-03-02 NOTE — Procedures (Signed)
Test Reason : Chills  Dizziness   Blood Pressure : ***/*** mmHG   Vent. Rate : 115 BPM     Atrial Rate : 115 BPM   P-R Int : 130 ms          QRS Dur : 068 ms   QT Int : 308 ms       P-R-T Axes : 082 078 003 degrees   QTc Int : 426 ms   Sinus tachycardia   Nonspecific T wave abnormality   Early repolarization   Abnormal ECG   When compared with ECG of 03-Jun-2007 15:26,   T wave inversion now evident in Inferior leads   Nonspecific T wave abnormality now evident in Lateral leads   Confirmed by Merilynn Finland, M.D., Scott ([id number]) on 03/03/2008 8:06:33 AM   Referred By: Hildred Priest, MD           Overread By: Marcello Moores, M.D.

## 2008-03-02 NOTE — Procedures (Signed)
Test Reason : Chills  Dizziness   Blood Pressure : ***/*** mmHG   Vent. Rate : 115 BPM     Atrial Rate : 115 BPM   P-R Int : 130 ms          QRS Dur : 068 ms   QT Int : 308 ms       P-R-T Axes : 082 078 003 degrees   QTc Int : 426 ms   Sinus tachycardia   Nonspecific T wave abnormality   Early repolarization   Abnormal ECG   When compared with ECG of 03-Jun-2007 15:26,   T wave inversion now evident in Inferior leads   Nonspecific T wave abnormality now evident in Lateral leads   Confirmed by Robertson, M.D., Scott ([id number]) on 03/03/2008 8:06:33 AM   Referred By: Tim Nelson, MD           Overread By: Scott Robertson, M.D.

## 2008-03-02 NOTE — ED Provider Notes (Signed)
[facility]                      EMERGENCY DEPARTMENT TREATMENT REPORT   NAME:  [name] [name], [name] [name]                      PT. LOCATION:     ER  [phone]   MR #:         BILLING #: [id number]          DOS: 03/02/2008   TIME: 4:22 P   78-46-96   cc:    Wynell Balloon, M.D.   CHIEF COMPLAINT   Nausea, vomiting, diarrhea.   HISTORY OF PRESENT ILLNESS   The patient states that she has been sick for 2 days, not able to tolerate   anything p.o.  She has tried drinking fluids, but has had several episodes   of vomiting per day and a couple episodes of diarrhea per day.  She denies   any abdominal pain.  No fevers.  She has had some chills.  She is diabetic   and has taken her insulin pump off because she has not been eating   anything.  She said she checked her sugar yesterday and it was about 130,   has not checked it today.  She notes she has had a history of C-section,   but no other abdominal surgeries.   REVIEW OF SYSTEMS   CONSTITUTIONAL: Positive for chills.  Negative for fever.   GASTROINTESTINAL: As noted above.   Denies complaints in any other system.   PAST MEDICAL HISTORY   Diabetes.   MEDICATIONS   Humalog, Valtrex.   ALLERGIES   Penicillin.   PHYSICAL EXAMINATION   VITAL SIGNS: Blood pressure 116/70, pulse 132, respirations 20, temperature   97.7.   CONSTITUTIONAL: This is a well-appearing 23 year old female.   HEENT: Head is atraumatic, normocephalic.  ENT:  Mucous membranes appear   dry.   RESPIRATORY:  Clear and equal breath sounds.  No respiratory distress,   tachypnea, or accessory muscle use.   CARDIOVASCULAR: Heart is  tachycardiac with no murmurs, gallops or rubs.   GASTROINTESTINAL:  Abdomen soft, nontender, without complaint of pain to   palpation.  No hepatomegaly or splenomegaly.  Abdomen soft, nontender.   MUSCULOSKELETAL:  Nails:  No clubbing or deformities.  Nail beds pink with   prompt capillary refill.   SKIN:  Warm and dry without rashes.    PSYCHIATRIC: Oriented to time, place and person.  Mood and affect   appropriate.   INITIAL ASSESSMENT AND MANAGEMENT PLAN   This is a 23 year old female with nausea, vomiting, and diarrhea for two   days.  We will go ahead and give her some IV fluids, check an i-STAT and   also dip her urine.   CONTINUATION BY DR. Marijo Sanes   DIAGNOSTIC IMPRESSION   I saw the patient with physician's assistant Ms. Conrad.  The patient   presents with vomiting and diarrhea.  She stopped her insulin pump   yesterday because her sugar was getting low and she was not eating much.   She has continued to vomit today.   Her initial i-STAT shows a glucose of 513 with a bicarbonate of 16, sodium   is appropriately low at 132, BUN 24 and creatinine 0.9.  She looks   dehydrated as well as I am concerned for diabetic ketoacidosis.  -I ordered   a chest x-ray which  is pending at this time.  The patient is not having any   coughing or fevers.   LABORATORY WORK   Demonstrates a white count 15.6, hemoglobin 16.5 and hematocrit 47.7,   platelet counts 288,000 with 89% segs.  Serum acetone is trace.  TSH and   free T4 normal, phosphorus is normal at 4.5, magnesium is normal 2.2.   Sodium is 130, bicarbonate is 16, glucose is 470, BUN 23 and creatinine   1.4.  ABG shows metabolic acidosis with pH of 7.285, pCO2 of 23, pO2 is 109   with bicarbonate of 11.  Saturation 90% on room air.   COURSE IN THE EMERGENCY DEPARTMENT   The patient was started a standard insulin infusion initially and will   continue this as this appears to be mild DKA (diabetic ketoacidosis).  I   have discussed the case with Dr. Chaney Malling, [facility] hospitalist that is   on call this evening.  The patient be admitted to the diabetes floor.   DIAGNOSES   1. Diabetic ketoacidosis.   2. Diarrhea.   3. Vomiting.   PLAN   Continue IV hydration.  Continue insulin drip.   PROCEDURE:  Critical care time including direct patient care, reviewing    medical record, evaluating results of diagnostic testing, discussions with   family members, and consulting with physicians: 40 minutes.   Electronically Signed By:   Marijo Sanes, M.D. 03/03/2008 22:55   ____________________________   Marijo Sanes, M.D.   Dictated by Zara Chess, PA-C   My signature above authenticates this document and my orders, the final   diagnosis(es), discharge prescription(s) and instructions in the [facility] record.   JDM  D:  03/02/2008  T:  03/02/2008  7:42 P   [id number]

## 2008-03-03 NOTE — H&P (Signed)
[facility]                              HISTORY AND PHYSICAL                              [name] [name], M.D.   NAMEJANAVIA, [name]   MR #:    [id number]                  ADM DATE:          03/02/2008   BILLING  [id number]                 PT. LOCATION       [id number]   #:   SS #     [id number]               DOB:  [birth year]   AGE:  22   [name] [name], M.D.              SEX:  F   cc:    [name] [name], M.D.          Wynell Balloon, M.D.   CHIEF COMPLAINT   Nausea and vomiting.   HISTORY OF PRESENT ILLNESS   This is a 23 year old African-American female presented to the emergency   department with complaint of persistent nausea, vomiting lasting   approximately 2-3 days.  She is insulin-dependent diabetic and reports that   because she has had poor oral intake she disconnected her insulin pump 1   day prior to admission.  She reports that since then her symptoms have   worsened.  She presented to the emergency department for evaluation.  Upon   arrival blood pressure was 116/70, pulse 132, respirations 20.  She is   afebrile, O2 saturation was 100% on room air.  Laboratory data was   significant for elevated glucose of 470 associated with an elevated   creatinine of 1.4 and hyponatremia.  The patient also had evidence of the   acidemia with a bicarbonate of 16.  She was diagnosed with diabetic   ketoacidosis, provided copious IV fluid and had insulin.  The patient was   admitted for further evaluation and treatment.   PAST MEDICAL HISTORY   Significant for insulin-dependent diabetes.  She also reports that she has   some bilateral hip pain, has recently had an x-ray, condyloma   SURGICAL HISTORY   Includes a cesarean section.   ALLERGIES   Penicillin.   MEDICATIONS   1. Insulin pump.   2. Valtrex 500 mg p.o. daily.   SOCIAL HISTORY   She denies any tobacco, alcohol, illicit drug use.  She is presently living    with her grandmother.  She has a single child and is unemployed.   FAMILY HISTORY   Significant for diabetes and hypertension as well as coronary artery   disease of her grandparents.  Her parents are in good health as well as a   sibling.   REVIEW OF SYSTEMS   She complains of some lightheadedness, nausea, vomiting.  Denies any   hematemesis.  She has some mild epigastric abdominal tenderness and denies   any diarrhea or constipation.  No heat or cold intolerance, increase in   weight or weight loss.  Otherwise, comprehensive review of systems is   negative.  PHYSICAL EXAMINATION   VITAL SIGNS:  Blood pressure 127/76, pulse 118, respirations 22, O2   saturation 100% on room air.   GENERAL:  She is awake, alert and oriented x3, nontoxic in appearance.  She   is frail.   HEENT:  Pupils equal, round, reactive to light and accommodation.   Extraocular muscles intact.  Pink conjunctive, anicteric sclerae.  Moist   oral mucosa.   NECK:  Supple.  No JVD (jugular venous distention) or lymphadenopathy.   CARDIOVASCULAR:  Regular rate and rhythm.   LUNGS:  Clear to auscultation bilaterally.   GASTROINTESTINAL: Abdomen is soft, nontender, scaphoid.  Positive bowel   sounds x4.   EXTREMITIES:  No cyanosis, clubbing or edema.   NEUROLOGICAL: Cranial nerves II-XII grossly intact.  No focal deficit.   LABORATORY DATA   Sodium 130, potassium 4.3, chloride 96, bicarbonate 16, BUN 23, creatinine   1.4, glucose 470, phosphorus  4.5.  Calcium is 9.8, magnesium 2.2.  She has   trace amount acetone.  CBC:  WBC 15,000, hemoglobin 16 and hematocrit  47,   platelets 288, 89% segs.  Arterial blood gas shows pH 7.28, pCO2 23, pO2   109, bicarbonate 11. ___ studies TSH 0.916 and free T4 is 1.42.   The patient was admitted for further evaluation and treatment.   IMPRESSIONS   1. Diabetic ketoacidosis.   2. Insulin-dependent diabetes.   3. Acute renal failure secondary to nausea and vomiting.   4. Dehydration.   PLAN    The patient will continue with copious IV fluid hydration as well as   insulin.  I have recommend that she restart her insulin.  The patient is   without her insulin pump.  We will continue with the insulin drip in the   interim.  DVT (deep venous thrombosis) and ulcer prophylaxis have also been   implemented.  Thank you for this opportunity to assist in the patient's   management.   Electronically Signed By:   [name] [name], M.D. 03/08/2008 09:57   ____________________________   [name] [name], M.D.   [facility]  D:  03/03/2008  T:  03/03/2008  6:37 P   [id number]

## 2008-03-05 NOTE — Discharge Summary (Signed)
[facility]                                DISCHARGE SUMMARY                              JUDI Army Melia, M.D.   NAME:  [name] [name], [name] [name]   MR #:  [id number]                      ADM DATE:   03/02/2008   Tiburcio Bash [id number]                     DIS DATE:     03/05/2008   G#   SS #   [id number]                   DOB:  [birth year]   Melissa Montane, M.D.                AGE:23                   SEX:  F   cc:   Melissa Montane, M.D.         Wynell Balloon, M.D.   PRIMARY CARE PHYSICIAN   Wynell Balloon, M.D.   DISCHARGE DIAGNOSES   1. Diabetic ketoacidosis.   2. Diabetes mellitus type 2.   3. Dehydration.   4. Acute renal failure.   5. Probable gastroparesis.   PROCEDURES PERFORMED DURING HOSPITALIZATION   None.   HISTORY OF PRESENT ILLNESS   This is a 23 year old African-American female presented to emergency   department with complaint of persistent nausea and vomiting approximately 2   to 3 days prior to admission.  The patient is an insulin dependent diabetic   and reports that when she developed poor oral intake she disconnected her   insulin pump.  She reports that her basal rate is 0.7 units per hour and   she normally provides herself with a sliding scale at mealtime based on her   carbohydrate intake.  Upon arrival to the ED vital signs were stable.   However, laboratory data noted an elevated glucose of 470.  She also had   elevated creatinine of 1.4 and her bicarbonate was 16.  The patient was   provided IV fluids and insulin drip.  She was admitted under the DKA   protocol.  Additional information can be gathered from the history and   physical.   HOSPITAL COURSE   The patient tolerated the IV fluid and her diabetes was controlled within a   24-hour period.  However, she continued to have nausea and vomiting.  I did   add Reglan IV q.a.c. meals and at bedtime and her emesis resolved.  The   patient is now able to tolerate an oral diet.  We also provided her with    subcu Lantus and maintained a sliding scale insulin to manage her blood   sugar because the patient did not have her insulin pump with her.  However,   now she is hemodynamically stable.  Her laboratory data has normalized and   she is prepared for discharge home.  I recommend that she continue with her   insulin as previously prescribed but I recommend that she obtain a gastric   emptying study to rule out the possibility  of gastroparesis before starting   her on Reglan as a maintenance course of medicine.  At this time she is   prepared for discharge home on the following medications: Benzoate 100 mg   p.o. t.i.d., Chloraseptic spray 1 spray to her throat as needed for sore   throat, Pepcid 20 mg b.i.d.  She should continue with her Lispro insulin   pump with sliding scale t.i.d. with meals.  She also continue with   hydrocodone 1 tablet p.o. b.i.d. and Valtrex 500 mg daily.  The patient   should follow up with her primary care physician in 2 to 4 weeks and also   obtain the gastric emptying study with the results forwarded to Dr. Wynell Balloon.   At the time of discharge her vital signs blood pressure was 129/67, pulse   88, respirations 18.  She is afebrile, O2 saturation 99% on room air.   Laboratory data BMP sodium 137, potassium 3.7, chloride 104, bicarbonate   22, BUN 8, creatinine 0.7, glucose 228, calcium is 81.  CBC WBC 7000,   hemoglobin and hematocrit 12 and 35, platelets 186.  Thyroid function   studies were within normal limits and hemoglobin A1c is 7.6.  Fasting blood   glucose today was 267.  The patient also reports that she had an x-ray of   her pelvis in late January.  I reviewed the scans and discussed with   Radiology.  They were normal with no evidence of fracture or arthritic   changes.  Thank you for this opportunity to assist in the patient's   management.   Electronically Signed By:   Melissa Montane, M.D. 03/08/2008 09:57   ____________________________   Melissa Montane, M.D.    LE  D:  03/05/2008  T:  03/07/2008  4:30 P   [id number]

## 2008-03-09 NOTE — ED Provider Notes (Signed)
[facility]                      EMERGENCY DEPARTMENT TREATMENT REPORT   NAME:  [name] [name], [name] [name]                      PT. LOCATION:     ER  ER50   MR #:         BILLING #: [id number]          DOS: 03/08/2008   TIME:12:58 A   [id number]   cc:    Wynell Balloon, M.D.   Primary Physician   Wynell Balloon, MD   CHIEF COMPLAINTS   Throwing up and chest pain for 1 week.   HISTORY OF PRESENT ILLNESS   A 23 year old diabetic female who presents with complaints of epigastric   pain radiating to the upper chest along with nausea and vomiting.  The   patient recently states that a week ago when her symptoms first began she   came to the emergency department for evaluation and treatment and was found   to be in diabetic ketoacidosis, treated for this, and sent home.  She now   presents with complaints of no improvement of her symptoms.  She has pain   that is 9/10, constant, especially with vomiting whether with food or not   eating at all. Her blood glucose has been controlled at 160s.  She denies   any fever or dysuria.   REVIEW OF SYSTEMS   CONSTITUTIONAL:  Denies fever or chills.   RESPIRATORY:  No cough, shortness of breath, or wheezing.   CARDIOVASCULAR:  Chest pain.   GI:  As above.   PAST MEDICAL HISTORY   Diabetes.   PAST SURGICAL HISTORY   C-section.   MEDICATIONS   Humalog and Valtrex.   ALLERGIES   Penicillin.   PHYSICAL EXAMINATION   VITAL SIGNS:  Blood pressure 130/81, pulse 111, respirations 18,   temperature 98.6, pain 9/10, and O2 sat 100% on room air.   GENERAL APPEARANCE:  The patient appears well developed and well nourished.   Appearance and behavior are 23 and situation and situation appropriate.   HEENT:  Eyes:  Conjunctivae clear, lids normal.  Pupils equal, symmetrical,   and normally reactive. Ears/Nose: Hearing is grossly intact to voice.   RESPIRATORY:  Clear and equal breath sounds.  No respiratory distress,   tachypnea, or accessory muscle use.    CARDIOVASCULAR:  Heart regular, without murmurs, gallops, rubs, or thrills.   PMI not displaced.   ABDOMEN:  Soft, nondistended with minimal complaints of palpation on the   epigastrium.  Hyperactive bowel sounds are heard throughout.  No pulsatile   masses.   SKIN:  Warm and dry without rashes.   INITIAL ASSESSMENT/MANAGEMENT PLAN   A 23 year old diabetic female who presents with complaints of nausea and   vomiting especially associated with foods. Prior history of DKA 1 week ago.   At this time, we will order CBC, CMP, lipase, give a bolus of fluids, urine   dip, and urine HCG.  I think this sounds like a gastroparesis in this   patient and when I reviewed prior notes, this is also what the   hospitalist's thought upon discharge that she should have a gastric   emptying.  She  will have a GI cocktail of this time and given Reglan.   CONTINUATION BY DR. Raynald Kemp:   The patient was seen with  Miss Laural Benes.  We have gone over the history and   physical examination together.  The patient is a diabetic who comes in with   nausea and vomiting.  She was evaluated last week with admission for DKA   (diabetic ketoacidosis) and found to have questionable gastric emptying.   She was not discharged on any medications for it because she was supposed   to have workup as an outpatient.  The patient, at this point, will have an   IV established with hydration and medicated.  The patient, on reevaluation,   felt better.  She was able to tolerate orals.  Her labs today look good.   The patient is not in DKA today and she, at this point, actually has an   appointment with her primary care doctor in the morning.  I have given her   a prescription for Reglan, instructed her to follow up with her doctor to   arrange for further testing as an outpatient as planned.   EMERGENCY DEPARTMENT MANAGEMENT   CBC, lipase and LFTs were unremarkable.   FINAL IMPRESSION   1. 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   2. Diabetes, stable.    DISPOSITION   Home.   Electronically Signed By:   Jerilynn Som, M.D. 03/22/2008 22:21   ____________________________   Jerilynn Som, M.D.   Dictated by Nelda Bucks, PA-C   My signature above authenticates this document and my orders, the final   diagnosis(es), discharge prescription(s) and instructions in the [facility] record.   TW  D:  03/09/2008  T:  03/09/2008  3:07 P   [id number]

## 2008-05-02 LAB — URINALYSIS W/ RFLX MICROSCOPIC
Bilirubin: NEGATIVE
Blood: NEGATIVE
Glucose: >1000 MG/DL — AB
Leukocyte Esterase: NEGATIVE
Nitrites: NEGATIVE
Protein: NEGATIVE MG/DL
Specific gravity: <1.005 (ref 1.003–1.030)
Urobilinogen: 0.2 EU/DL (ref 0.2–1.0)
pH (UA): 7 (ref 5.0–8.0)

## 2008-05-02 LAB — CBC WITH AUTOMATED DIFF
ABS. EOSINOPHILS: 0.1 10*3/uL (ref 0.0–0.4)
ABS. LYMPHOCYTES: 3 10*3/uL (ref 0.8–3.5)
ABS. MONOCYTES: 0.4 10*3/uL (ref 0–1.0)
ABS. NEUTROPHILS: 4.3 10*3/uL (ref 1.8–8.0)
BASOPHILS: 2 % (ref 0–3)
EOSINOPHILS: 2 % (ref 0–5)
HCT: 36.1 % (ref 36.0–46.0)
HGB: 12.4 g/dL (ref 12.0–16.0)
LYMPHOCYTES: 38 % (ref 20–51)
MCH: 30.5 PG (ref 25.0–35.0)
MCHC: 34.4 g/dL (ref 31.0–37.0)
MCV: 88.5 FL (ref 78.0–102.0)
MONOCYTES: 5 % (ref 2–9)
MPV: 9.8 FL (ref 7.4–10.4)
NEUTROPHILS: 53 % (ref 42–75)
PLATELET: 227 10*3/uL (ref 130–400)
RBC: 4.08 M/uL — ABNORMAL LOW (ref 4.10–5.10)
RDW: 12.7 % (ref 11.5–14.5)
WBC: 8 10*3/uL (ref 4.5–13.0)

## 2008-05-02 LAB — METABOLIC PANEL, COMPREHENSIVE
A-G Ratio: 0.9 (ref 0.8–1.7)
ALT (SGPT): 32 U/L (ref 30–65)
AST (SGOT): 16 U/L (ref 15–37)
Albumin: 3.7 g/dL (ref 3.4–5.0)
Alk. phosphatase: 123 U/L (ref 50–136)
Anion gap: 5 mmol/L (ref 5–15)
BUN/Creatinine ratio: 5 — ABNORMAL LOW (ref 12–20)
BUN: 6 MG/DL — ABNORMAL LOW (ref 7–18)
Bilirubin, total: 0.4 MG/DL (ref 0.1–0.9)
CO2: 28 MMOL/L (ref 21–32)
Calcium: 9 MG/DL (ref 8.4–10.4)
Chloride: 102 MMOL/L (ref 100–108)
Creatinine: 1.1 MG/DL (ref 0.6–1.3)
GFR est AA: >60 mL/min/{1.73_m2} (ref >60)
GFR est non-AA: >60 mL/min/{1.73_m2} (ref >60)
Globulin: 4.1 g/dL — ABNORMAL HIGH (ref 2.0–4.0)
Glucose: 317 MG/DL — ABNORMAL HIGH (ref 74–99)
Potassium: 3.5 MMOL/L (ref 3.5–5.5)
Protein, total: 7.8 g/dL (ref 6.4–8.2)
Sodium: 135 MMOL/L — ABNORMAL LOW (ref 136–145)

## 2008-05-02 LAB — LIPASE: Lipase: 209 U/L (ref 73–393)

## 2008-05-02 LAB — CKMB PROFILE
CK - MB: <0.5 ng/ml — ABNORMAL LOW (ref 0.5–3.6)
CK: 63 U/L (ref 21–215)

## 2008-05-02 LAB — TROPONIN I: Troponin-I, QT: <0.04 NG/ML (ref 0.00–0.08)

## 2008-05-02 LAB — HCG URINE, QL: HCG urine, QL: NEGATIVE

## 2008-07-14 NOTE — Procedures (Signed)
Test Reason : CP/dizzy   Blood Pressure : ***/*** mmHG   Vent. Rate : 103 BPM     Atrial Rate : 103 BPM   P-R Int : 120 ms          QRS Dur : 072 ms   QT Int : 340 ms       P-R-T Axes : 078 073 061 degrees   QTc Int : 445 ms   Sinus tachycardia   Otherwise normal ECG   When compared with ECG of 02-Mar-2008 17:03,   Nonspecific T wave abnormality has replaced inverted T waves in Inferior lea   Nonspecific T wave abnormality no longer evident in Lateral leads   Confirmed by Tory Emerald, M.D., Maruthi ([id number]) on 07/14/2008 10:30:06 PM   Referred By: Dianna Rossetti, MD           Overread By: Donella Stade, M.D.

## 2008-07-14 NOTE — ED Provider Notes (Signed)
[facility]                      EMERGENCY DEPARTMENT TREATMENT REPORT   NAME:  [name] [name], [name] [name]                SEX:            F   DATE:  07/14/2008                     DOB:            October 07, 1985   MR#    [id number]                       TIME SEEN        2:58 A   ACCT#  [id number]                      ROOM:           ER  JX91   cc:    [name] [name], M.D.   CHIEF COMPLAINT   Throwing up, dizzy.   HISTORY OF PRESENT ILLNESS   This 23 year old female arrived to the emergency department stated that   around 1800-1900 tonight she had fish and maybe 2-3 hours later started   having some abdominal discomfort and started vomiting several times.  Felt   slightly lightheaded after the vomiting and had some chest pain after   vomiting.  Right now she has no chest pain, no lightheadedness, slight   abdominal pain.  No flank pain. No urinary symptoms.   REVIEW OF SYSTEMS   Complaining of fevers, chills, nausea, vomiting.  No urinary symptoms.   Denies complaints in any other system.   PAST MEDICAL HISTORY   Insulin-dependent diabetes mellitus.   FAMILY HISTORY/SOCIAL HISTORY   Negative.   ALLERGIES   Penicillin.   PHYSICAL EXAMINATION   VITAL SIGNS:  Blood pressure 116/70, pulse 118, respirations 18,   temperature 99.0.  Pain scale 8/10. Saturations 99%.   GENERAL APPEARANCE:  The patient appears well developed and well nourished.   Appearance and behavior are age and situation appropriate.   HEENT: Head normocephalic and atraumatic. Eyes:  Conjunctivae clear, lids   normal.  Pupils equal, symmetrical, and normally reactive.   NECK:  Supple, nontender, symmetrical, no masses or JVD, trachea midline.   Thyroid not enlarged, nodular, or tender.   RESPIRATORY:  Clear and equal breath sounds.  No respiratory distress,   tachypnea, or accessory muscle use.   CARDIOVASCULAR: Heart regular rate and rhythm without any murmurs, gallops,   rubs or thrills.    GI:  Complaining of midepigastric pain, right upper quadrant pain.   MUSCULOSKELETAL: Stance and gait appear normal.   SKIN:  Warm and dry without rashes.   NEUROLOGIC:  Alert, oriented.  Sensation intact, motor strength equal and   symmetric.   Will go ahead and get a CBC, CMP and lipase, liter of fluid, Zofran and   reevaluate afterwards.   DIAGNOSTIC STUDIES   An i-STAT:  Sodium 136, glucose 271, CO2 26.  Urine contaminated with   squamous epithelial 15-29, wbc'[name] 30-49, bacteria +3, positive nitrites as   well.   FINAL DIAGNOSES   1. Acute urinary tract infection.   2. Abdominal pain evaluation.   3. Gastroesophageal reflux disease.   4. Vomiting.   DISPOSITION   The patient was placed given Cipro, Pyridium,  Prilosec and Zofran.  The   patient was given a GI cocktail and felt much better afterwards.  The   patient was personally evaluated by myself and Dr. Dianna [name] who agrees   with the above assessment and plan.   ____________________________   [name] [name], M.D.   Dictated By:  [name] Ota, PA-C   My signature above authenticates this document and my orders, the final   diagnosis(es), discharge prescription([name]) and instructions in the [facility] record.   dh  D:  07/14/2008  T:  07/15/2008 11:12 P   [id number]

## 2008-07-14 NOTE — Procedures (Signed)
Test Reason : CP/dizzy   Blood Pressure : ***/*** mmHG   Vent. Rate : 103 BPM     Atrial Rate : 103 BPM   P-R Int : 120 ms          QRS Dur : 072 ms   QT Int : 340 ms       P-R-T Axes : 078 073 061 degrees   QTc Int : 445 ms   Sinus tachycardia   Otherwise normal ECG   When compared with ECG of 02-Mar-2008 17:03,   Nonspecific T wave abnormality has replaced inverted T waves in Inferior lea   Nonspecific T wave abnormality no longer evident in Lateral leads   Confirmed by Gotti, M.D., Maruthi ([id number]) on 07/14/2008 10:30:06 PM   Referred By: Helen Hsu, MD           Overread By: Maruthi Gotti, M.D.

## 2008-10-25 NOTE — ED Provider Notes (Signed)
[facility]                      EMERGENCY DEPARTMENT TREATMENT REPORT   PRELIMINARY (DRAFT) -- FINAL REPORT  in HPF   NAME:  [name] [name], [name] [name]                SEX:            F   DATE:  10/25/2008                     DOB:            1985/02/03   MR#    [id number]                       TIME SEEN        5:40 P   ACCT#  [id number]                      ROOM:           ERO EO03   cc:    Wynell Balloon, M.D.   ER EVALUATION TIME   10/25/2008 at 1740.   CHIEF COMPLAINT   Throwing up, weak, dizziness.   HISTORY OF PRESENT ILLNESS   This is a 23 year old female comes in after vomiting x 2 days.  She had   surgery on her face to remove 2 cysts near her jawline.  She says that she   was taking Tylenol with codeine for pain and then started to vomit.  She   has been throwing up, not able to keep anything down.  She has not had   diarrhea or abdominal pain.  She relates facial discomfort secondary to not   being able to take her pain medicine.  Has not had a fever or chills today.   She was dizzy, so she decided to come in for evaluation.  She has taken   herself off her insulin pump and has not checked her blood sugar today.   REVIEW OF SYSTEMS   CONSTITUTIONAL:  No fever, no chill.   ENT:  See HPI.   RESPIRATORY:  No cough, shortness of breath, or wheezing.   CARDIOVASCULAR:  No chest pain, chest pressure, or palpitations.   GASTROINTESTINAL:  No vomiting, diarrhea, or abdominal pain.   MUSCULOSKELETAL:  No joint pain or swelling.   INTEGUMENTARY:  No rashes.   NEUROLOGICAL:  See HPI.   PAST MEDICAL HISTORY   Includes insulin-dependent diabetes, recent facial surgery.   SOCIAL HISTORY   Denies smoking, is here with her sister.   FAMILY HISTORY   Includes diabetes, hypertension.   MEDICATIONS   Listed and reviewed in [facility].   ALLERGIES   Penicillin.   PHYSICAL EXAMINATION   GENERAL APPEARANCE:  The patient appears well developed and well nourished.    Appearance and behavior are age and situation appropriate.   VITAL SIGNS:  Blood pressure 107/77, pulse 120, respirations 18,   temperature 98.3, 100% on room air, 9/10 pain.   HEENT:  Eyes:  Conjunctivae clear, lids normal.  Pupils equal, symmetrical,   and normally reactive.  Mouth/Throat:  Surfaces of the pharynx, palate, and   tongue are pink, moist, and without lesions.  Facial exam reveals swelling   significantly to the lower jaw line that is not indurated or red.  She has   an Op-Site over her surgical wound that does not  appear to be dehiscent,   erythematous or draining any purulent material.   NECK:  Supple without adenopathy.   RESPIRATORY:  Clear and equal breath sounds.  No respiratory distress,   tachypnea, or accessory muscle use.   CARDIOVASCULAR:  Heart regular, without murmurs, gallops, rubs, or thrills.   PMI not displaced.   GASTROINTESTINAL:  Abdomen soft, nontender, without complaint of pain to   palpation.  No hepatomegaly or splenomegaly.   SKIN:  No rash.   NEUROLOGICAL:  The patient is awake, alert.  She is upright.  She is using   a computer.  She is nontoxic in her appearance.   INITIAL ASSESSMENT/MANAGEMENT PLAN   This is a patient who recently had a facial procedure surgical.  She has a   large amount of facial swelling there which she thinks is post surgical but   has worsened in the last day.  Concern would be for infection versus a   seroma or blood.  We are going to check some labs due to the fact that she   has been vomiting, to check her blood sugars and see what her white count   is and hydrated.   COURSE IN THE EMERGENCY DEPARTMENT   The patient'[name] white blood cell count came back 13.0 with an H&amp;H of 13 and   39.  79 segs.  BMP showed a sodium of 130, glucose 342, CO2 of 20,   otherwise normal.   The patient was given 2 liters of fluid.  She received incremental doses of   morphine and Zofran.  She was no longer vomiting however concern for    infection was present as well as the fact that her blood sugar was elevated   with a possible infection.  Dr. Truddie Crumble spoke with Dr. Agapito Games who asked   that we open the area.  (Please see Dr. Darrick Grinder procedure note), pack   it and culture it and place her on antibiotics, and he was by to see the   patient here.  We will be placing the patient in the observation unit under   his service.   CLINICAL IMPRESSION/DIAGNOSES   1. Facial abscess.   2. Recent facial surgery.   3. Hyperglycemia.   DISPOSITION   Observation to Dr. Agapito Games' service pending wound culture.  She is   received clindamycin here in the department to continue in the ER   observation unit, as she is penicillin allergic.   CONTINUATION BY DR. Truddie Crumble   INITIAL ASSESSMENT/MANAGEMENT PLAN   This is a new problem for this patient.   DIAGNOSTIC STUDIES   The following were obtained.  BMP shows sodium 130, a CO2 of 20 and a   glucose of 342.  This equals an anion gap of 12.  CBC white blood cell   count 13.0, remainder of CBC is normal.   COURSE IN THE EMERGENCY DEPARTMENT   The patient remained hemodynamically stable but somewhat tachycardia while   in the emergency department.  She received IV fluids and her heart rate   came down from 120 to 110.   CLINICAL IMPRESSION/DIAGNOSES   1. Right facial abscess.   2. Hyperglycemia.   DISPOSITION AND PLAN   I discussed with her surgeon, Dr. Agapito Games.  Dr. Agapito Games would like [place] to   open the wound and obtain cultures.  Then he would like to place the   patient on the observation unit on sliding scale insulin coverage and also   IV antibiotics.  The patient will be given her first dose of IV clindamycin   while in the emergency department pending move over to the observation   unit.  Dr. Agapito Games said he will come see the patient this evening.  The   patient is feeling better and we will place in the observation unit in   stable condition.   PROCEDURE NOTE BY DR. [facility]   PROCEDURE    Abscess incision and drainage.   OPERATOR   Dr. Truddie Crumble.   Due to the patient'[name] abscess on the right side of her jaw, I gave the   patient some intradermal lidocaine. I then used a scalpel to cut the   sutures holding the incision together.  A large amount of foul-smelling pus   then poured out of the wound.  I irrigated the wound with normal saline,   expressed as much pus as I could.  Then we packed the wound with Iodoform   gauze.  The patient tolerated the procedure well, and there were no   complications.  Cultures were sent to the laboratory.   ____________________________   Orma Flaming, M.D.   Dictated By:  Julien Girt   My signature above authenticates this document and my orders, the final   diagnosis(es), discharge prescription([name]) and instructions in the [facility] record.   ec  D:  10/25/2008  T:  10/26/2008 12:18 A   [id number]

## 2008-10-26 NOTE — H&P (Signed)
[facility]                              HISTORY AND PHYSICAL                              Louanne Belton, M.D.   PRELIMINARY (DRAFT) -- FINAL REPORT  in HPF   NAME:   [name] [name], [name] [name]              SEX:                F   ADMIT:  10/25/2008                 DOB:                1985/07/08   MR#     [id number]                   ROOM:               [id number]   ACCT#   [id number]   cc:    Louanne Belton, M.D.   ADMISSION DIAGNOSIS   Right cheek cellulitis and abscess.   SUMMARY   The patient is a 23 year old female.  She presented to the office about a   week ago with a persistent mass on the right cheek.  She had been evaluated   by Oral Surgery and there was not found to be any oral or dental component.   She had excisional biopsy.  Pathology results are pending.  She presented   to the emergency room with a 2-day history of nausea, vomiting, malaise and   enlarging mass in the right cheek area.  Upon evaluation, she was felt to   have an abscess.  She underwent incision and drainage.  A significant   amount of mucopurulent material was expressed.  The wound was packed.  The   material was sent for Gram stain and culture.  She was placed on   observation and IV antibiotics.  She has failed to satisfactorily responded   and egress.  She will, therefore, be admitted for continuing treatment.   PAST MEDICAL HISTORY   The patient's past medical history is significant for insulin-dependent   diabetes.  She has an insulin pump.  Otherwise, her history is   unremarkable.  She has had no other previous surgeries.   ALLERGIES   Penicillin.   SOCIAL HISTO   She denies the use of alcohol or tobacco.   FAMILY HISTORY   Shows scattered diabetes and hypertension.   REVIEW OF SYSTEMS   Unremarkable other than above.   EXAMINATION   The patient was examined in the observation unit.  She was awake, alert and   cooperative.   VITAL SIGNS:  Her blood pressure is noted to be 91/60.  Pulse is 89,    respirations 18, temperature 98.8.  The patient is 5 feet, 5 inches and   weighs 51.2 kg.   HEENT:  Normocephalic, atraumatic.  Pupils are equal and round to light and   accommodation.  Oropharynx is clear.  She has no cervical adenopathy.  She   has a surgical bandage on the right cheek.  She has an incision about 1 cm   that is open and packed.  She has surrounding erythema and tenderness.   Excursion of the mandible is  reduced but intact.  No intraoral drainage.   No dental issues.   CHEST:  Clear to auscultation and percussion.   HEART:  Regular rate and rhythm without murmurs.   BACK:  Negative CVA tenderness.   ABDOMEN:  Normoactive bowel sounds.  No mass, tenderness, bruise or   organomegaly.   PELVIC/RECTAL:  Deferred.   EXTREMITIES:  Pulses are +3/+4 throughout.  No lymphedema.   NEUROLOGIC:  Unremarkable.   LABORATORY DATA   The patient's admission white count was noted to be 13,300 with a shift to   the left.  This morning, it is 13,000.  The hemoglobin and hematocrit are   11.1 and 32.  The platelet count is adequate.  Electrolyte profile shows   slightly hyponatremic at 130.  The CO2 is slightly low at 30.  Glucose is   elevated.  This ranged between 179 and 413.  It is being covered with   Regular insulin after Accu-Cheks in addition to her insulin pump.   ANALYSIS   1. Right cheek cellulitis and abscess.   2. Insulin-dependent diabetes.   3. Suspicious mass of right cheek.   PLAN   The patient is being admitted for continuing IV clindamycin.  This was   begun empirically.  A Gram stain has shown gram-positive cocci,   gram-positive bacilli and gram-negative bacilli.  Will await identification   and cultures for definitive antibiotic treatment.  Hopefully, we will be   able to utilize an oral agent or agents.  We will continue with IV morphine   as necessary and add Percocet.  Will continue IV hydration.  We will   continue with her Accu-Cheks and Regular insulin coverage.  The patient was    scheduled for a facial mandible CT scan yesterday but was unable to achieve   such because of her condition.  We will go ahead and order that upon her   admission.  I suspect she will probably be admitted for 2-3 days for   stabilization.  Admission is necessary due to her diabetes and increasing   incidence of potential spread of the cellulitis and consequences.   __________________________   Louanne Belton, M.D.   Dictated By:   lo  D:  10/26/2008  T:  10/27/2008  7:22 A   [id number]

## 2008-10-27 NOTE — Discharge Summary (Signed)
[facility]                                DISCHARGE SUMMARY                              Louanne Belton, M.D.   PRELIMINARY (DRAFT) -- FINAL REPORT  in HPF   NAME:  [name] [name], [name] [name]                   ADMIT DATE:       10/25/2008   MR#    [id number]                          [facility] DATE:       10/27/2008   ACCT#  [id number]                         SEX:              Angela Cox, M.D.                     DOB:              01/10/1986   cc:    Louanne Belton, M.D.   ADMISSION DIAGNOSES   1. Abscess and facial cellulitis, right cheek.   2. Insulin-dependent diabetes.   3. Recent cyst excision, right cheek.   DISCHARGE DIAGNOSES   Same   PROCEDURES   Incision and drainage of abscess, right cheek, on 10/25/2008.   CONSULTATIONS   None.   COMPLICATIONS   None.   Discharged to home, stable.   DISCHARGE MEDICATIONS   1. Cleocin 150 mg four times a day.   2. Percocet p.r.n..   3. Benadryl p.r.n.   4. Insulin pump as well as Accu-Chek and insulin coverage at home.   FOLLOWUP   He is to follow up in the office in 1 week.   He should continue with progressively and sequentially less packing to the   right cheek on a daily basis.   SUMMARY   This patient is a 23 year old female with insulin-dependent diabetes.   Approximately 10 days ago in the office, she underwent excision of a cyst   in the right cheek.  Over a period of 2 days, she began to develop malaise,   fever, chills and pain in the right jaw area.  She also had nausea and   vomiting.  She presented to the emergency room.  She was found to be   hyperglycemic and had an abscess in the right cheek.  The white blood cell   count was elevated over 13,000.  She was febrile.  She had incision and   drainage of the abscess in the right cheek by removal of the sutures.  A   culture was obtained.  The wound was packed.  She was placed initially in   observation and placed empirically on Cleocin IV.  She had some itching    associated with this and this was controlled with Benadryl.  Morphine   initially and then Percocet p.o. were utilized for pain control.  Her white   blood cell count gradually normalized.  She became afebrile.  She was   feeling better and tolerating p.o. with less pain.  Accu-Cheks  continued to   be high but were better controlled, although still somewhat erratic.  On   10/27/2008, the patient was prepared and ready for discharge.  Cultures   showed a Gram stain with gram-positive cocci, gram-positive bacilli and   gram-negative bacilli.  Identification and sensitivities are still pending.   She is, therefore, being discharged to home on 150 mg Cleocin p.o. q.i.d.   for 10 days.  Additional antibiotics or altered antibiotic therapy regimen   will be adjusted after the identification and culture and sensitivity are   known.  She obviously is responding to the Cleocin which is being given IV.   Will also allow her to use Percocet for pain.  She can also use Tylenol or   Motrin.  She will continue with her insulin pump at home.  I have advised   that she do Accu-Cheks 4 times a day with insulin coverage for any   fluctuations.  Diet as tolerated was advised.  She will pack the wound   every day and clean it with a dilute peroxide and saline solution.   Progressively less packing of 1/4-inch Nugauze will be placed on a daily   basis to allow the wound to heal secondarily.  The CT scan obtained of her   face and jaw showed no mandibular bony abnormalities or other facial   abnormalities.  We will see her back in the office in 1 week.  She was also   advised to begin acidophilus or lactobacillus four times a day.  She has a   history in the past of urinary tract infections.  She is advised to   increase fluid intake, especially acidic components like cranberry juice or   grapefruit juice.  If she begins to develop any type of a vaginitis, she   was advised to pick up over-the-counter Mycostatin cream.    __________________________   Louanne Belton, M.D.   Dictated By:   lo  D:  10/27/2008  T:  10/31/2008  4:08 P   [id number]

## 2009-02-10 NOTE — Discharge Summary (Signed)
[facility]                          OBSERVATION DISCHARGE SUMMARY               PRELIMINARY (DRAFT) -- FINAL REPORT  in HPF   NAME:       [name] [name], [name] [name]                SEX:           F   MR#         [id number]                     D.O.B:         May 20, 1985   ACCT#       [id number]                    ROOM:          ERO [id number]       cc:    Wynell Balloon, M.D.           DATE/TIME OF ASSIGNMENT   02/10/09 at 2308.       DATE/TIME OF DISPOSITION   02/11/09 at 1015.       ASSIGNMENT DIAGNOSES   1. Dehydration.   2. Acute gastritis.   3. Diabetes.       DISPOSITION DIAGNOSES   1. Dehydration with IV fluids.   2. Gastritis, improved.   3. Diabetes.       HISTORY   This is a 24 year old complaining of chest pain or vomiting.  She says the   pain feels like it is coming from the vomiting.  It feels like a burning   sensation.  She has been unable to keep anything down.  She was given IV   fluids, labs were checked, and she was sent to the observation unit for   continued IV hydration and Accu-Chek.       COURSE IN THE OBSERVATION UNIT   The patient's vital signs remained stable.  Her glucose did go down to 40   but she was then given some peanut butter and some other things p.o. and   her sugar came back up to 159.  She felt improved and is sitting up eating   a muffin and she has had some juice.       EXAMINATION AT DISPOSITION   GENERAL APPEARANCE:  The patient feels improved.   RESPIRATORY:  Clear and equal breath sounds.  No respiratory distress,   tachypnea, or accessory muscle use.   CARDIOVASCULAR:  Heart regular, without murmurs, gallops, rubs, or thrills.   PMI not displaced.   GI:  Abdomen soft, nontender, without complaint of pain to palpation.  No   hepatomegaly or splenomegaly.       LABORATORIES   Repeat labs show a white count of 19,000 and potassium of 3.4.  Glucose was   40 but that has been corrected.       DISPOSITION    The patient discharged home and given a prescription for Phenergan.  She is   told to follow up with her primary care physician and return for worsening   or new concerns.  The patient was personally evaluated by myself and Dr.   Cipriano Mile, who agrees with the above assessment and plan.  ________________________   Damien Fusi, M.D.       Dictated By:  Zara Chess, PA-C       My signature above authenticates this document and my orders, the final   diagnosis(es), discharge prescription(s) and instructions in the [facility] record.       sp1  D:  02/11/2009  T:  02/11/2009 12:31 P   [id number]

## 2009-02-10 NOTE — ED Provider Notes (Signed)
[facility]                      EMERGENCY DEPARTMENT TREATMENT REPORT           PRELIMINARY (DRAFT) -- FINAL REPORT  in HPF   NAME:  [name] [name], [name] [name]                SEX:            F   DATE:  02/10/2009                     DOB:            13-Dec-1985   MR#    [id number]                       TIME SEEN        6:41 P   ACCT#  [id number]                      ROOM:           ERO EO05       cc:    Wynell Balloon, M.D.               TIME OF EVALUATION:  1834       CHIEF COMPLAINT:  Vomiting and chest pain.       HISTORY OF PRESENT ILLNESS:  The patient is a 24 year old female who is a   diabetic.  She has had nausea and vomiting since early this morning and,   she states, 2 episodes of diarrhea yesterday but none today.  She has some   generalized abdominal pain that began after the vomiting.  Unsure whether   or not she has had fevers.       REVIEW OF SYSTEMS:   CONSTITUTIONAL:  The patient is unaware of whether or not she has had   fevers.  She has had no chills or weight loss.   ENT:  No sore throat, runny nose, or other URI symptoms.   RESPIRATORY:  No cough, shortness of breath, or wheezing.   CARDIOVASCULAR:  The patient has developed some midsternal chest pain after   vomiting all day long.   GASTROINTESTINAL:  The patient has been vomiting.  She states she does not   know how many times since early this morning.  She had diarrhea twice   yesterday but none today.   GENITOURINARY:  No dysuria, frequency, or urgency.   INTEGUMENTARY:  No rashes.       PAST MEDICAL HISTORY:  Significant for diabetes, urinary tract infections,   pyelonephritis, and DKA.       FAMILY HISTORY:  Noncontributory.       SOCIAL HISTORY:  Presents to the emergency room alone.       ALLERGIES:  Penicillin.       CURRENT MEDICATIONS:  Humalog.       PHYSICAL EXAMINATION:   VITAL SIGNS:  Blood pressure 129/91, respirations 15, O2 saturation 97% on   room air, pulse 84, temperature 98.8, pain 8.    GENERAL APPEARANCE:  Patient appears well developed and well nourished.   Appearance and behavior are age and situation appropriate.   RESPIRATORY:  Clear and equal breath sounds.  No respiratory distress,   tachypnea, or accessory muscle use.   CARDIOVASCULAR:  Heart is regular.  No murmurs or rubs.   CHEST:  Chest symmetrical without masses or tenderness.   GI:  Abdomen is scaphoid, soft, nondistended.  Mildly tender to palpation   generally and moderately tender to palpation in the epigastrium.  No   rebound.  No peritoneal signs.  No CVA tenderness to percussion.   SKIN:  Warm and dry without rashes.   NEUROLOGIC:  Alert, oriented.  Sensation intact, motor strength equal and   symmetric.       CONTINUATION BY DR. KISA       INITIAL ASSESSMENT AND MANAGEMENT PLAN   A patient with vomiting.  She is diabetic.  Question is whether she has   diabetic ketoacidosis or gastroparesis.  She will be treated further and   evaluated for comorbid complications.  Nursing notes were reviewed.  Old   records reviewed.       LABORATORY TESTS   Urinalysis was negative for infection, was not spilling glucose.  The   patient'[name] white count is 15,000.  H&amp;H is 30 and 13.  The patient'[name] CMP is   essentially within normal limits.  Lipase is 59.  Hemoglobin A1c is 9.7 and   acetone negative.       COURSE IN THE EMERGENCY DEPARTMENT   The patient received 2 L of fluid and was actually feeling a little better.   She also received Carafate, Mylanta, Protonix, Reglan and Zofran.  Morphine   was also given.  She was more comfortable and morphine was wearing off.   She was still a little bit nauseated and was reluctant to take p.o. fluids   immediately.  She was placed in observation for continued hydration and   treatment of her symptoms.  It does not appear to be diabetic ketoacidosis   or gastroparesis at this time.  Abdomen was nontender.  She will be   followed carefully over there.       CLINICAL IMPRESSION    1. Acute vomiting and dehydration.   2. Acute gastritis.   3. Diabetes, stable at this time.           &lt;MDMsgStart:&gt;    Partial Report:  Waiting to be merged.   &lt;MDMsgEnd:&gt;                               ____________________________   Wetzel Bjornstad. Arvella Merles, M.D.       Dictated By:  Debbora Presto, PA-C       My signature above authenticates this document and my orders, the final   diagnosis(es), discharge prescription([name]) and instructions in the [facility] record.       st  D:  02/10/2009  T:  02/11/2009  9:44 A   [id number]   dn  D:  02/11/2009  T:  02/11/2009  9:12 A   [id number]

## 2009-02-12 NOTE — ED Provider Notes (Signed)
[facility]                      EMERGENCY DEPARTMENT TREATMENT REPORT           PRELIMINARY (DRAFT) -- FINAL REPORT  in HPF   NAME:  [name] [name], [name] [name]                SEX:            F   DATE:  02/12/2009                     DOB:            August 04, 1985   MR#    [id number]                       TIME SEEN        1:40 A   ACCT#  [id number]                      ROOM:           ER  [id number]       cc:    Wynell Balloon, M.D.           Primary Physician   Wynell Balloon, M.D.       TIME SEEN   [phone].       CHIEF COMPLAINT   Nausea, vomiting and abdominal pain.       HISTORY OF PRESENT ILLNESS   A 24 year old female presents with 3 days of nausea and vomiting with left   upper quadrant abdominal pain.  No black or bloody stools.  She has a   history of diabetes and states that her blood sugars have been within their   normal range.       REVIEW OF SYSTEMS   CONSTITUTIONAL:  No fever.   EYES: No visual symptoms.   ENT: No sore throat, runny nose or other URI symptoms.   RESPIRATORY:  No cough, shortness of breath, or wheezing.   CARDIOVASCULAR:  No chest pain, chest pressure, or palpitations.   GASTROINTESTINAL:  Abdominal pain, vomiting.  No diarrhea.   GENITOURINARY:  No dysuria, frequency, or urgency.   MUSCULOSKELETAL:  No joint pain or swelling.   INTEGUMENTARY:  No rashes.   NEUROLOGICAL:  No headaches, sensory or motor symptoms.       PAST MEDICAL HISTORY   Insulin-dependent diabetes mellitus, C-section, cyst removal from cheek.       SOCIAL HISTORY   Here alone.       FAMILY HISTORY   Noncontributory.       ALLERGIES   PENICILLIN.       MEDICATIONS   Reviewed in [facility].       PHYSICAL EXAMINATION   VITAL SIGNS:  Blood pressure 139/85, pulse 83, respiratory rate 17,   temperature 98.4, O2 saturations 99% on room air.  Pain:  10 out of 10.   GENERAL APPEARANCE:  A 24 year old female presents appearing uncomfortable.        HEENT:  Eyes:  Conjunctivae clear, lids normal.  Pupils equal, symmetrical,   and normally reactive.  Mouth/Throat:  Surfaces of the pharynx, palate, and   tongue are pink, moist, and without lesions.   NECK:  Supple, symmetrical.  Trachea midline.   LYMPHATICS:  No cervical or submandibular lymphadenopathy palpated.   RESPIRATORY:  Clear and equal breath sounds.  No respiratory  distress,   tachypnea, or accessory muscle use.   CARDIOVASCULAR:  Heart:  Regular rate and rhythm.   GASTROINTESTINAL:  Abdomen is soft with tenderness in the left upper   quadrant.  No rebound, some voluntary guarding.   MUSCULOSKELETAL:  Stance and gait appear normal.   SKIN:  Warm and dry without rashes.   PSYCHIATRIC:  Recent and remote memory appear to be intact.   NEUROLOGICAL:  No focal deficits.       CONTINUED BY [facility], PA-C       INITIAL ASSESSMENT AND MANAGEMENT PLAN   An insulin-dependent diabetic presents with nausea and vomiting, left   quadrant abdominal pain.  We are going to check some basic labs to rule out   pancreatitis.  We will also hydrate with some IV fluids and antiemetics.       DIAGNOSTIC STUDIES   The patient'[name] CBC with an elevated white count of 14,800, segs elevated at   73%, lymphocytes low at 23%, lipase normal at 84, CMP remarkable for a low   albumin of 3, an elevated glucose of 307 with a normal CO2 of 23.  She has   a mildly low chloride of 97 and a mildly low sodium of 131, potassium   normal at 4.1.       COURSE IN EMERGENCY DEPARTMENT   On recheck of the patient after a liter of IV fluids, IV Compazine,   Benadryl, Dilaudid and Pepcid the patient is pain free and tolerating sips   of liquids without difficulty.       DIAGNOSES   1. Vomiting.   2. Gastritis.   3. Hyperglycemia.       PLAN   1. The patient is discharged home in stable condition, with instructions to      follow up with their regular doctor.  They are advised to return      immediately for any worsening or symptoms of concern.    2. Prescription written for Phenergan suppositories and tablets.  Return if      new or worsening symptoms.       The patient was personally evaluated by myself and Dr. Luciano Cutter who agrees with   the above assessment and plan.                                           ____________________________   Mickie Kay, M.D.       Dictated By:  Salem Caster, P.A.       My signature above authenticates this document and my orders, the final   diagnosis(es), discharge prescription([name]) and instructions in the [facility] record.       sb1  D:  02/12/2009  T:  02/13/2009 11:16 A   [id number]

## 2009-04-17 NOTE — ED Provider Notes (Signed)
[facility]                      EMERGENCY DEPARTMENT TREATMENT REPORT           PRELIMINARY (DRAFT) -- FINAL REPORT  in HPF   NAME:  [name] [name], [name] [name]                SEX:            F   DATE:  04/17/2009                     DOB:            1985-05-31   MR#    [id number]                       TIME SEEN        1:04 P   ACCT#  [id number]                      ROOM:           ER  ER58       cc:    Wynell Balloon, M.D.           TIME   11:11.       CHIEF COMPLAINT   Headache for four days, nausea.       HISTORY OF THE PRESENT ILLNESS   This is a 24 year old female who woke up   with a small left-sided throbbing headache. She says over the last 4 days,   it has gradually gotten more severe and has spread throughout her head. She   has been nauseous and had one episode of vomiting yesterday. She is a   diabetic for which she takes Humalog. She says she checked her sugar   yesterday; it was 167. This morning, she ate  Trix cereal and was not able   to take her insulin. Currently, her blood sugar is 337. She says typically   her glucose is under control, and she is compliant. She says sometimes her   blood sugar tops at 210. Since the onset of her headache, she has also been   photophobic. She says she does not have a history of headaches or   migraines.       REVIEW OF SYSTEMS CONSTITUTIONAL: No fever or chills.   EYES: Photophobia.   ENT: No sore throat, runny nose or other URI symptoms.   RESPIRATORY:  No cough, shortness of breath, or wheezing.   CARDIOVASCULAR:  No chest pain, chest pressure, or palpitations.   GASTROINTESTINAL: Nausea, vomiting. No abdominal pain, diarrhea or   constipation.   GENITOURINARY:  No dysuria, frequency, or urgency.   MUSCULOSKELETAL:  No joint pain or swelling.   INTEGUMENTARY:  No rashes.   NEURO:   Headache.       PAST MEDICAL HISTORY   Diabetes treated with insulin.       SOCIAL HISTORY   Here alone.       FAMILY HISTORY   Noncontributory.        ALLERGIES   PENICILLIN.       MEDICATIONS Humalog.       PHYSICAL EXAMINATION Blood pressure 107/64, pulse 117, respirations 18,   temperature 97.9, oxygen 100% on room air.   GENERAL APPEARANCE: The patient is ill appearing, but well-developed and   well-nourished.   Eyes:  Conjunctivae clear, lids  normal.  Pupils equal, symmetrical, and   normally reactive.   Ears/Nose:  Hearing is grossly intact to voice.  Internal and external   examinations of the ears and nose are unremarkable. Mouth/Throat:  Surfaces   of the pharynx, palate, and tongue are pink, moist, and without lesions.   NECK: Supple.   LYMPHATIC:   No cervical or submandibular lymphadenopathy palpated.   RESPIRATORY:  Clear and equal breath sounds.  No respiratory distress,   tachypnea, or accessory muscle use.   HEART: Regular rate and rhythm. No murmurs, rubs, gallops.   CHEST:  Chest symmetrical without masses or tenderness.   GI: Abdomen soft, nondistended,  nontender. Normal bowel sounds.   MUSCULOSKELETAL: Head normocephalic, atraumatic. Good movement of all   extremities.   SKIN:  Warm and dry without rashes.   NEURO:   Strength and sensation equal to bilateral upper and lower   extremities.       CONTINUATION BY JENNY ALINDOGAN, PA-C       INITIAL ASSESSMENT AND MANAGEMENT PLAN   A 24 year old female with hyperglycemia, also complained of headache,   nausea and vomiting.  Will go ahead and will obtain urine, urine pregnancy   and continue to monitor her glucose with Accu-Cheks and administer sliding   scale.       DIAGNOSTIC STUDIES   Glucose fingerstick initially showed 335. Urine was positive for glucose   and ketones. Urine pregnancy was negative. She was given insulin as well as   IV Benadryl and Compazine. Headache, nausea and vomiting improved.   Fingerstick was rechecked, and it was 225.  I-STAT was done which shows  a   normal anion gap of 17.  The final glucose fingerstick was 118.       COURSE    The patient was made aware of all of these diagnostic tests results. She   felt well enough to be discharged home to follow-up with her primary care   physician. She was instructed to continue to monitor her glucose and take   her insulin.       FINAL DIAGNOSES   1. Hyperglycemia.   2. Cephalgia.   3. Nausea and vomiting.       The patient was discharged with a prescription for Fioricet and Phenergan.   The patient is discharged home in stable condition, with instructions to   follow up with their regular doctor.  They are advised to return   immediately for any worsening or symptoms of concern. The patient was   personally evaluated by myself and Dr. Carmela Hurt  who agrees with the   above assessment and plan.                               ____________________________   Dixie Dials, M.D.       Dictated By:  Lester Kinsman, PA-C       My signature above authenticates this document and my orders, the final   diagnosis(es), discharge prescription([name]) and instructions in the [facility]   PulseCheck record.       pb  D:  04/17/2009  T:  04/17/2009 11:23 P   [id number]
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
## 2010-09-15 NOTE — Procedures (Signed)
Test Reason : Chest pain   Blood Pressure : ***/*** mmHG   Vent. Rate : 074 BPM     Atrial Rate : 074 BPM      P-R Int : 142 ms          QRS Dur : 084 ms       QT Int : 390 ms       P-R-T Axes : 084 078 069 degrees      QTc Int : 432 ms   Sinus rhythm with marked sinus arrhythmia   Non-specific ST-T changes most consistent with early repolarization   Otherwise normal ECG   When compared with ECG of 14-Jul-2008 01:48,   No significant change was found   Confirmed by Alene Mires, M.D., Verdon Cummins [phone]) on 09/16/2010 10:47:59 AM   Referred By:             Overread By: Gita Kudo, M.D.

## 2010-09-15 NOTE — Consults (Signed)
[facility]   CONSULTATION REPORT   NAME:  [name] [name], [place]   SEX:   F   ADMIT: 09/15/2010   DATE OF CONSULT: 09/15/2010   REFERRING PHYSICIAN: SUSAN S KIM-FOLEY   DOB: October 10, 1985   MR#    [id number]   ROOM:     ACCT#  [id number]       cc: [name] Ruths MD, [name] Balloon MD       REASON FOR CONSULTATION:   Coffee-ground hematemesis.       IMPRESSION:   1.  Limited upper gastrointestinal bleeding -- suspect Mallory-Weiss tear as    etiology.  Differential diagnosis would include reflux esophagitis and peptic    ulcer disease.  The patient's single episode of coffee-ground hematemesis in    the emergency room after having repeated episodes of nausea and vomiting is    highly suggestive of a Mallory-Weiss tear.  Note that this patient has no    prior history of upper gastrointestinal bleeding and denies early satiety    (doubt diabetic gastroparesis).   2.  Insulin-dependent diabetes mellitus.   3.  Urinary tract infection.       PLAN:   1.  Continue Protonix IV q.12 h.   2.  EGD on Tuesday by Dr.  Gelene [name] to determine etiology of bleeding.  I    discussed this with the patient in detail.  The patient has provided verbal    consent to proceed with upper endoscopy.   3.  Discussed the case tonight with Dr.  Gelene [name], who will assume the care    of this patient from a gastroenterology perspective 09/16/10.       HISTORY OF PRESENT ILLNESS:   A pleasant 25 year old [place] American female with poorly controlled    insulin-dependent diabetes mellitus presented to the emergency room this    afternoon with history of nausea and vomiting.  She apparently had repeated    episodes of nausea and vomiting prior to presentation.  She had chest    discomfort.  In the emergency room, she had an episode of coffee-ground    hematemesis.  According to the patient, she has never had coffee-ground    hematemesis in the past.  She denies frank bloody hematemesis, melena, or     hematochezia.  The patient denies frequent heartburn or acid regurgitation and    denies early satiety.       PAST MEDICAL HISTORY:   See above.       CURRENT MEDICATIONS:   Protonix 40 mg IV q.12 h., morphine sulfate, and insulin subcutaneous.        SOCIAL HISTORY:   She is a nonsmoker, nondrinker.       FAMILY HISTORY:   There is no family history of esophagitis, peptic ulcer disease or esophageal    malignancy.       REVIEW OF SYSTEMS:   Notable for nausea, vomiting, coffee-ground hematemesis, and chest pain.  The    rest of her review of systems is unremarkable.       PHYSICAL EXAMINATION:   GENERAL:  Healthy appearing African American female, pleasant, well developed,    no acute distress.   VITAL SIGNS:  In emergency room include a blood pressure 150/95, pulse 83,    respirations 17, temperature 97.1 degrees Fahrenheit.   HEENT:  Revealed anicteric sclerae, dry mucous membranes and absence of    palpable lymphadenopathy.   LUNGS:  Clear to auscultation.   CARDIAC:  Reveals  a regular rate and rhythm and no murmur.   ABDOMEN:  Soft, slightly tender in the epigastrium, without    hepatosplenomegaly.  Bowel sounds are hypoactive but present.   EXTREMITIES:  Without edema or cyanosis.       LABORATORIES ON ADMISSION:     Include a white blood cell count of 12.3, hemoglobin of 12.7, platelet count    253,000, AST of 10, ALT of 13, total bilirubin 0.4, lipase of 72, Accu-Chek    192, and negative blood acetone level.       PLAN:   As discussed above, will continue IV Protonix and proceed with upper endoscopy    on Tuesday.           ___________________   [name] Ramming MD   Dictated By:.    [name] [name]   D:09/15/2010   T: 09/15/2010 21:08:30   [id number]

## 2010-09-15 NOTE — ED Provider Notes (Signed)
KNOWN ALLERGIES   Penicillins       TRIAGE Wynelle Link Sep 15, 2010 10:47 ZOX0)   PATIENT: NAME: [name] [name], AGE: 25, GENDER: female, DOB: [birth year]         Nov 17, 1985, TIME OF GREET: Sun Sep 15, 2010 10:36, LANGUAGE:         [place], [place]: [phone], KG WEIGHT: 56.7, HEIGHT: 167cm, MEDICAL         RECORD NUMBER: [phone], ACCOUNT NUMBER: [id number], PCP: Melton Krebs,. (Sun Sep 15, 2010 10:47 ENL2)   ADMISSION: URGENCY: 3, DEPT: Emergency, BED: WAITING. Wynelle Link Sep 15, 2010 10:47 ENL2)   VITAL SIGNS: BP 121/101, (Sitting), Pulse 83, Resp 17, Temp 97.0,         (Oral), Pain 9, O2 Sat 100, on Room air, Time 09/15/2010 10:45. (10:45         ENL2)   COMPLAINT:  Vomiting/Chest Hurt. Wynelle Link Sep 15, 2010 10:47         ENL2)   PRESENTING COMPLAINT:  pt c/o n/v and chest pain, emesis is blood         tinged, hx of DM, blood sugar 342 in triage. (11:06 TLS4)   PAIN: Patient complains of pain, On a scale 0-10 patient rates         pain as 9, chest, Pain is constant. (11:06 TLS4)   IMMUNIZATIONS:  Immunizations up to date, Last tetanus shot         received less than 10 years ago. (11:06 TLS4)   LMP: Last menstrual period: 09-07-2010. (11:06 TLS4)   TB SCREENING: TB screen not applicable for this patient. (11:06         TLS4)   ABUSE SCREENING: Patient denies physical abuse or threats. (11:06         TLS4)   FALL RISK: Patient has a low risk of falling. (11:06 TLS4)   SUICIDAL IDEATION: Suicidal ideation is not present. (11:06         TLS4)   ADVANCE DIRECTIVES: Patient does not have advance directives,         Triage assessment performed. (11:06 TLS4)   PROVIDERS: TRIAGE NURSE: Lubertha South, RN. Wynelle Link Sep 15, 2010         10:47 ENL2)   PREVIOUS VISIT ALLERGIES: Penicillins. Wynelle Link Sep 15, 2010 10:47         ENL2)       CURRENT MEDICATIONS (10:51 ENL2)   Lantus:  28 UNITS Subcutaneous once a day.   Humalog:  SSC See Notes. insulin pump - titrate according to         glucose levels.       MEDICATION SERVICE    Morphine Sulfate:  Order: Morphine Sulfate - Dose: 6 mg         : IV         Ordered by: Farrel Demark, PA-C         Entered by: Farrel Demark, PA-C Sun Sep 15, 2010 11:23 ,          Acknowledged by: Shawnee Knapp, RN Wynelle Link Sep 15, 2010 11:24         Documented as given by: Shawnee Knapp, RN Wynelle Link Sep 15, 2010 11:50         Patient, Medication, Dose, Route and Time verified prior to         administration.  Time given: 1145, Amount given: 6MG , IV site 1, Medication         administered into left hand, IVP, Initial medication, Slowly,         Catheter placement confirmed via flush prior to administration, IV         site without signs or symptoms of infiltration during medication         administration, No swelling during administration, No drainage during         administration, IV flushed after administration, Correct patient,         time, route, dose and medication confirmed prior to administration,         Patient advised of actions and side-effects prior to administration,         Allergies confirmed and medications reviewed prior to administration,         Patient in position of comfort, Side rails up, Cart in lowest         position, Call light in reach.    : Follow Up : Signs or symptoms of allergic reaction noted, pt         medicated with benadryl 25mg  sivp. (12:05 TLS4)    : Follow Up :  Decreased pain, On a scale 0-10 patient rates         pain as 5. (12:36 TLS4)   Novolin R:  Order: Novolin R (Insulin Regular Human Recombinant)         - Dose: * Per Protocol : IV         Ordered by: Farrel Demark, PA-C         Entered by: Farrel Demark, PA-C Sun Sep 15, 2010 11:24 ,          Acknowledged by: Shawnee Knapp, RN Wynelle Link Sep 15, 2010 11:24,          Co-signed by: Jolly Mango, RN, CEN Sun Sep 15, 2010 12:15,          Held by: Shawnee Knapp, RN Wynelle Link Sep 15, 2010 12:17 Reason: Dr.         Henrene Hawking would like to see pt and possibly change medication         administration plan          Documented as given by: Shawnee Knapp, RN Wynelle Link Sep 15, 2010 13:20         Patient, Medication, Dose, Route and Time verified prior to         administration.          Time given: 1320, Amount given: 6units/hr=51ml/hr, IV site 1,         Medication administered into left hand, Drip/IVPB, IVPB mixed in:         , IVPB mixed in: 1/2ns bag, via pump tubing, on an IV         pump, at 2ml/hr, Catheter placement confirmed via flush prior to         administration, IV site without signs or symptoms of infiltration         during medication administration, No swelling during administration,         No drainage during administration, IV flushed after administration,         Correct patient, time, route, dose and medication confirmed prior to         administration, Patient advised of actions and side-effects prior to         administration, Allergies confirmed and medications reviewed prior to  administration, Patient in position of comfort, Side rails up, Cart         in lowest position.    : Follow Up : Infusion changed to 1.3UNITS/HR=13ML/HR. (14:20         TLS4)    : Follow Up : Infusion changed to NO CHANGE IN UNIT DOSAGE         INFUSION. (16:31 TLS4)   Pepcid:  Order: Pepcid (Famotidine) - Dose: 20 mg : IV         Ordered by: Farrel Demark, PA-C         Entered by: Farrel Demark, PA-C Sun Sep 15, 2010 11:24 ,          Acknowledged by: Shawnee Knapp, RN Wynelle Link Sep 15, 2010 11:24         Documented as given by: Shawnee Knapp, RN Wynelle Link Sep 15, 2010 12:34         Patient, Medication, Dose, Route and Time verified prior to         administration.          Time given: 1230, Amount given: 20mg , IV site 1, Medication         administered into left hand, IVP, Initial medication, Slowly,         Catheter placement confirmed via flush prior to administration, IV         site without signs or symptoms of infiltration during medication          administration, No swelling during administration, No drainage during         administration, IV flushed after administration, Correct patient,         time, route, dose and medication confirmed prior to administration,         Patient advised of actions and side-effects prior to administration,         Allergies confirmed and medications reviewed prior to administration,         Patient in position of comfort, Side rails up, Cart in lowest         position, Call light in reach.   Rocephin:  Order: Rocephin (Ceftriaxone Sodium) - Dose:         1 gm : IV         POTENTIAL ALLERGY REACTION: 'Penicillins' - Not a true allergy         Ordered by: Farrel Demark, PA-C         Entered by: Farrel Demark, PA-C Sun Sep 15, 2010 14:05 ,          Acknowledged by: Shawnee Knapp, RN Wynelle Link Sep 15, 2010 14:06         Documented as given by: Shawnee Knapp, RN Wynelle Link Sep 15, 2010 14:30         Patient, Medication, Dose, Route and Time verified prior to         administration.          Time given: 1425, Amount given: 1gm, IV site 1, Medication         administered into left hand, Drip/IVPB, Premixed, IVPB mixed in:         , via primary tubing, at 200 ml/hr, Catheter placement confirmed         via flush prior to administration, IV site without signs or symptoms         of infiltration during medication administration, No swelling during         administration, No drainage during administration, IV  flushed after         administration, Correct patient, time, route, dose and medication         confirmed prior to administration, Patient advised of actions and         side-effects prior to administration, Allergies confirmed and         medications reviewed prior to administration, Patient in position of         comfort, Side rails up, Cart in lowest position.    : Follow Up : IVPB start time: 1425, IVPB stop time: 1500.         (15:49 TLS4)   Zofran:  Order: Zofran (Ondansetron Hydrochloride) -         Dose: 4 mg : IV          Ordered by: Farrel Demark, PA-C         Entered by: Farrel Demark, PA-C Sun Sep 15, 2010 11:15 ,          Acknowledged by: Jolly Mango, RN, CEN Sun Sep 15, 2010 11:20         Documented as given by: Shawnee Knapp, RN Wynelle Link Sep 15, 2010 11:20         Patient, Medication, Dose, Route and Time verified prior to         administration.          Time given: 1118, Amount given: 4mg , IV site 1, Medication         administered into left hand, IVP, Initial medication, Slowly,         Catheter placement confirmed via flush prior to administration, IV         site without signs or symptoms of infiltration during medication         administration, No swelling during administration, No drainage during         administration, IV flushed after administration, Correct patient,         time, route, dose and medication confirmed prior to administration,         Patient advised of actions and side-effects prior to administration,         Allergies confirmed and medications reviewed prior to administration,         Patient in position of comfort, Side rails up, Cart in lowest         position.       ORDERS   Blood Glucose:  Ordered for: Henrene Hawking, MD, Melvyn Neth         Status: Done by Dionisio Paschal, RN, Elige Ko Sep 15, 2010 10:52. (10:52         ENL2)   IV- Normal Saline 1 liter Bolus:  Ordered for: Henrene Hawking, MD, Melvyn Neth         Status: Done by Gabriel Earing, RN, Jeronimo Greaves Sep 15, 2010 11:20.         (11:15 DIH0)   Urine dip (send for lab U/A if positive):  Ordered for: Henrene Hawking,         MD, Melvyn Neth         Status: Done by Lyndee Hensen III, Melanie Crazier Sep 15, 2010 12:47.         (11:22 DIH0)   Urine HCG:  Ordered for: Henrene Hawking, MD, Melvyn Neth         Status: Done by Vonita Moss, Melanie Crazier Sep 15, 2010 12:47.         (11:22 DIH0)   CBC, AUTOMATED DIFFERENTIAL:  Ordered for: Henrene Hawking, MD,  Lewis         Status: Done by System Sun Sep 15, 2010 11:50. (11:23 DIH0)   ACETONE QUAL:  Ordered for: Henrene Hawking, MD, Lewis          Status: Done by System Sun Sep 15, 2010 12:14. (11:23 DIH0)   I-Stat Chem8+ (complete):  Ordered for: Henrene Hawking, MD, Melvyn Neth         Status: Done by Vonita Moss, Melanie Crazier Sep 15, 2010 11:41.         (11:23 DIH0)   Insulin infusion per Standard Order Set:  Ordered for: Henrene Hawking         MD, Melvyn Neth         Status: Done by Gabriel Earing RN, Jeronimo Greaves Sep 15, 2010 12:15.         (11:24 DIH0)   LIPASE:  Ordered for: Henrene Hawking, MD, Lewis         Status: Done by System Sun Sep 15, 2010 12:16. (11:25 DIH0)   CHEST 2 VIEWS:  Ordered for: Henrene Hawking, MD, Lewis         Status: Active. (11:25 DIH0)   12 LEAD EKG:  Ordered for: Henrene Hawking, MD, Lewis         Status: Active. (11:25 DIH0)   HEPATIC FUNCTION PANEL:  Ordered for: Henrene Hawking, MD, Melvyn Neth         Status: Done by System Sun Sep 15, 2010 12:16. (11:25 DIH0)   URINE CULTURE:  Ordered for: Henrene Hawking, MD, Lewis         Status: Active. (12:48 TLJ1)   URINALYSIS:  Ordered for: Henrene Hawking, MD, Lewis         Status: Done by System Sun Sep 15, 2010 13:07. (12:48 TLJ1)   Page [facility] Hospitalist for admission:  Ordered for: Henrene Hawking, MD,         Melvyn Neth         Status: Done by Dolores Frame Sep 15, 2010 14:07. (14:07         [facility])   HOSPITAL OBSERVATION for Dr Dalphine Handing:  Lenna Gilford for: Henrene Hawking, MD,         Melvyn Neth         Status: Done by Dolores Frame Sep 15, 2010 15:16. (14:53         DIH0)   MONITOR ELECTRODE:  Ordered for: Henrene Hawking, MD, Lewis         Status: Active. (17:25 TLS4)   Elita Boone IV Cath:  Ordered for: Henrene Hawking, MD, Lewis         Status: Active. (17:25 TLS4)   IV Start kit:  Ordered for: Henrene Hawking, MD, Melvyn Neth         Status: Active. (17:25 TLS4)   BP Cuff Adult Regular:  Ordered for: Henrene Hawking, MD, Lewis         Status: Active. (17:25 TLS4)       NURSING ASSESSMENT: ABDOMEN (11:10 TLS4)   CONSTITUTIONAL: History obtained from patient, Patient arrives,         via hospital wheelchair, Gait steady, Patient appears, in distress          due to pain, generally ill, Patient cooperative, Patient alert,         Oriented to person, place and time, Skin warm, Skin dry, Skin normal         in color, Mucous membranes pink, Mucous membranes moist, Patient is         well-groomed, Patient complains of vomiting.   PAIN: burning pain, sharp pain, chest,  on a scale 0-10 patient         rates pain as 9.   ABDOMEN: Abdomen assessment findings include abdomen symmetrical,         Abdomen soft, non-tender, Associated with vomiting, currently, Number         of times: 4, blood tinged.   LMP: First day last menstrual period, Last period started on         09/07/2010 11:13.   GENITOURINARY FEMALE: no associated urinary complaints.       NURSING PROCEDURE: ADMISSION (16:11 TLS4)   ADMISSION: Report faxed to unit, Fax receipt confirmed, by JOLLIE         RN, Transported via cart/stretcher, Accompanied by emergency         department technician, Transported with IV medication(s) infusion.       NURSING PROCEDURE: EKG CHART (11:32 AJH2)   PATIENT IDENTIFIER: Patient's identity verified by patient         stating name, Patient's identity verified by patient stating birth         date, Patient's identity verified by hospital ID bracelet.   EKG: EKG indicated for vomiting, chest hurting, 12 lead EKG         performed on the left chest, done by Abby, PM, first EKG.   FOLLOW-UP: After procedure, EKG for interpretation given to Dr.         Truddie Crumble, EKG was given to Dr. at 1610.   NOTES: Patient tolerated procedure well.   SAFETY: Side rails up, Cart/Stretcher in lowest position, Call         light within reach, Hospital ID band on.       NURSING PROCEDURE: IV (11:21 TLS4)   PATIENT IDENITIFIER: Patient's identity verified by patient         stating name, Patient's identity verified by patient stating birth         date.   IV SITE 1: IV therapy indicated for hydration, IV therapy         indicated for medication administration, IV established, to the left          hand, using a 20 gauge catheter, in one attempt, Flushed with normal         saline (mls): 10, Labs drawn at time of placement, labeled in the         presence of the patient and sent to lab, Labs drawn at 1110,         Tourniquet removed from patient after procedure., Labs labeled in the         presence of the patient and then sent to the Lab.   FOLLOW-UP SITE 1: IV Fluids started at 1115, IV Fluids stopped at         1245, Amount of IV fluids infused was .   FLUIDS: 0.9 normal saline 1 liter hung, first bag, IV bolus of         1000 ml established, at a wide open rate, via primary tubing.   SAFETY: Side rails up, Cart/Stretcher in lowest position, Call         light within reach, Hospital ID band on.       NURSING PROCEDURE: NURSE NOTES (12:06 TLS4)   NURSES NOTES: Notes: pt c/o raised hive-like veins after         receiving morphine.       DIAGNOSIS (14:08 DIH0)   FINAL: PRIMARY: IDDM poorly controlled, ADDITIONAL: hemetemesis,  Urinary tract infection (UTI).       DISPOSITION   PATIENT:  Disposition Type: Discharged, Disposition: Discharged,         Condition: Stable. (14:08 DIH0)      Disposition Type: (none), Disposition: (none). (14:22 DIH0)      Disposition Type: Observation, Disposition: Hospital Observation Regular         Bed. (14:52 DIH0)      IV Infusion: IV still infusing, Patient left the department. (17:26         TLS4)   Key:     AJH2=Hunt, PM, Abigail  DIH0=Houle, PA-C, Lafonda Mosses  ENL2=Lopienski, RN,     Consuella Lose     TLJ1=Johnson, ACT III, Irish Lack  TLS4=Sondergaard, RN, Ladona Ridgel

## 2010-09-15 NOTE — ED Provider Notes (Signed)
[facility]   EMERGENCY DEPARTMENT TREATMENT REPORT   NAME:  [place], [place]   SEX:   F   ADMIT: 09/15/2010   DOB:   Mar 07, 1985   MR#    [id number]   ROOM:     TIME SEEN: 02 06 PM   ACCT#  [id number]       cc: Wynell Balloon MD       I hereby certify this patient for admission based upon medical necessity as    noted below:       CHIEF COMPLAINT:   Abdominal pain, vomiting.       HISTORY OF PRESENT ILLNESS:   This is a 25 year old female who presents stating she has been vomiting since    8:30 this morning.  Initially it was clear now dark in color.  Vomited    greater than 10 times, complains of some epigastric abdominal pain radiating    to chest, sharp burning in nature, constant since the onset of the vomiting    and moderate in intensity, 5 on a scale of 1 to 10.       REVIEW OF SYSTEMS:   CONSTITUTIONAL:  No fever.   ENT:  No sore throat.   RESPIRATORY:  No cough, no shortness of breath.   CARDIOVASCULAR:  Positive mid chest pain.   GASTROINTESTINAL:  Positive abdominal pain, positive vomiting, no bloody    stools.  Positive hematemesis.   GENITOURINARY:  No dysuria.   MUSCULOSKELETAL:  No extremity swelling.   INTEGUMENTARY:  No rash.   NEUROLOGICAL:  No dizziness.       PAST MEDICAL HISTORY:   Diabetes, on insulin.       SOCIAL HISTORY:   Denies tobacco.  Negative ETOH.  No other family members ill with similar    symptoms.       FAMILY HISTORY:   Noncontributory.       PHYSICAL EXAMINATION:   VITAL SIGNS:  Blood 150/95, pulse 83, respirations 17, temperature 97.1.  O2    saturation 100%.   GENERAL:  Well-developed, well-nourished female, alert, vomiting in the ED,    has some Gastroccult positive emesis noted in emesis basin, no bright red    blood just mucousy and then some dark specks.     Eyes:  Conjunctivae clear, lids normal.  Pupils equal, symmetrical, and    normally reactive.     Mouth/Throat:  Surfaces of the pharynx, palate, and tongue are pink, moist,    and without lesions.       NECK:  Supple, nontender, symmetrical, no masses or JVD, trachea midline,    thyroid not enlarged, nodular, or tender.  No cervical or submandibular    lymphadenopathy palpated.     RESPIRATORY:  Clear and equal breath sounds.  No respiratory distress,    tachypnea, or accessory muscle use.       HEART:  Normal sinus rhythm, no murmurs heard.   ABDOMEN:  Positive bowel sounds, soft, some epigastric tenderness.  No    guarding, no rebound, no mass palpable.   SKIN:  No rash.   EXTREMITIES:  No edema.   NEUROLOGIC:  The patient alert, moving all extremities.  No acute focal    deficits.       INITIAL IMPRESSION:   With the forceful vomiting and frequency of it, I suspect the hematemesis is    secondary to Mallory-Weiss tears.  There is no bright red blood.  Her    Accu-Chek in  the Emergency Department was 342.  We will check a chemistry    check for any Diabetic Ketoacidosis.  She was given some IV fluids for    hydration and was given some IV morphine and Zofran for pain and nausea.     Possible pancreatitis.       ANCILLARY STUDIES:   Chemistry obtained.  BUN 6, glucose 343, bicarbonate 20.  CBC obtained:  White    count of 12.3, hemoglobin 12.7, acetone negative, lipase is normal, LFTs    normal.  Urine positive for 30 to 49 white cells, 4+ bacteria.  Her pregnancy    test was negative.  Chest x-ray was negative.         EMERGENCY DEPARTMENT COURSE:   The patient started on insulin drip, given IV Pepcid for the hematemesis.     Cardiogram obtained shows a sinus rhythm at a rate of 74, no ST elevation.     Repeat evaluation at 1400, the patient not having any vomiting, still has some    epigastric pain but better.       FINAL DIAGNOSES:   1.  Insulin-dependent diabetes mellitus poorly controlled.   2.  Hematemesis.   3.  Urinary tract infection.       PLAN:   The patient will be placed in observation.  Was given some IV Rocephin in the    Emergency Department, she states rash with penicillin in the past.          The patient evaluated by myself and Dr. Henrene Hawking who agrees with the above    assessment and plan.           ___________________   Konrad Felix MD   Dictated By: Windell Hummingbird. [place], [place]   KB   D:09/15/2010   T: 09/15/2010 14:30:37   [id number]

## 2010-09-15 NOTE — Procedures (Signed)
Test Reason : Chest pain   Blood Pressure : ***/*** mmHG   Vent. Rate : 074 BPM     Atrial Rate : 074 BPM      P-R Int : 142 ms          QRS Dur : 084 ms       QT Int : 390 ms       P-R-T Axes : 084 078 069 degrees      QTc Int : 432 ms   Sinus rhythm with marked sinus arrhythmia   Non-specific ST-T changes most consistent with early repolarization   Otherwise normal ECG   When compared with ECG of 14-Jul-2008 01:48,   No significant change was found   Confirmed by [place] Cl[place], M.D., Jesse ([id number]) on 09/16/2010 10:47:59 AM   Referred By:             Overread By: Jesse St. Clair, M.D.

## 2010-09-18 NOTE — Procedures (Signed)
Test Reason : Chest Pain   Blood Pressure : ***/*** mmHG   Vent. Rate : 127 BPM     Atrial Rate : 127 BPM      P-R Int : 114 ms          QRS Dur : 068 ms       QT Int : 302 ms       P-R-T Axes : 081 077 050 degrees      QTc Int : 438 ms   Sinus tachycardia   Otherwise normal ECG   When compared with ECG of 15-Sep-2010 11:32,   Vent. rate has increased BY  53 BPM   Confirmed by Cleon Gustin, M.D., Madelynn Done. ([id number]) on 09/19/2010 3:32:51 PM   Referred By:             Overread By: Merlene Pulling, M.D.

## 2010-09-18 NOTE — Procedures (Signed)
Test Reason : Chest Pain   Blood Pressure : ***/*** mmHG   Vent. Rate : 127 BPM     Atrial Rate : 127 BPM      P-R Int : 114 ms          QRS Dur : 068 ms       QT Int : 302 ms       P-R-T Axes : 081 077 050 degrees      QTc Int : 438 ms   Sinus tachycardia   Otherwise normal ECG   When compared with ECG of 15-Sep-2010 11:32,   Vent. rate has increased BY  53 BPM   Confirmed by Ashby, M.D., Charles Jr. ([id number]) on 09/19/2010 3:32:51 PM   Referred By:             Overread By: Charles J    Ashby, M.D.

## 2010-09-19 NOTE — H&P (Signed)
[facility]   History and Physical   NAME:  [name] [name], [name] [name]   SEX:   F   ADMIT: 09/15/2010   DOB:October 27, 1985   MR#    [id number]   ROOM:     ACCT#  [id number]       I hereby certify this patient for admission based upon medical necessity as    noted below:       &lt;cc: Wynell Balloon MD       CHIEF COMPLAINT:   Nausea, vomiting and abdominal pain.       HISTORY OF PRESENT ILLNESS:   The patient is 25 year old female with past medical history of type 1 diabetes    on insulin.  She presented to the emergency room complaining of nausea,    vomiting and abdominal pain.  She was having repeated episodes of vomiting and    in the emergency room, she was found to have episodes of coffee-ground    emesis.  She was found to have UTI, leukocytosis and admitted for further    evaluation and management.       PAST MEDICAL HISTORY:   Type 1 diabetes.       PAST SURGICAL HISTORY:   None.       ALLERGIES:   PENICILLIN.       HOME MEDICATIONS:   Humalog sliding scale and Lantus at bedtime.       FAMILY HISTORY:   Noncontributory.       SOCIAL HISTORY:   Denies smoking, alcohol or illicit drug use.       REVIEW OF SYSTEMS:   CONSTITUTIONAL:  No fever or chills.   EYES:  No visual disturbance.   EARS:  No hearing difficulty.   MOUTH:  No oral lesions.   NECK:  No neck stiffness.   CARDIOVASCULAR:  No chest pain or palpitations.   RESPIRATORY:  No cough or shortness of breathing.   GASTROINTESTINAL:  She has nausea, vomiting.   GENITOURINARY:  No dysuria, frequency or urgency.   SKIN:  No rash.   MUSCULOSKELETAL:  No joint swelling or pain.   NEUROLOGIC:  No headache or dizziness.       PHYSICAL EXAMINATION:   GENERAL:  The patient is not in acute distress.   VITAL SIGNS:  Blood pressure 121/101, pulse rate 83, respirations 17,    temperature 97.  O2 saturation 100% on room air.   HEENT:  Pupils are equal and reactive to light.  Anicteric sclerae.   NECK:  No JVD.  Thyroid not palpable.    CARDIOVASCULAR SYSTEM:  Regular rate and rhythm, S1-S2 well heard.  No murmur    or gallop.   LUNGS:  Clear to auscultation bilaterally.   ABDOMEN:  Soft, nontender, no distension, positive bowel sounds.   EXTREMITIES:  No edema or cyanosis.   SKIN:  No rash.   MUSCULOSKELETAL:  No joint swelling or pain.   NEUROLOGICAL:  No headache or dizziness.       LABORATORY DATA:   Her labs show sodium 135, potassium 4.1, chloride 104, bicarbonate 19, BUN 12,    creatinine 0.9.  Her last sodium 138, potassium 3.6, chloride 103, BUN 6,    creatinine 0.7.  WBC 12.3, hemoglobin 12.7 and platelet 253.  UA shows    positive nitrites, 30 to 49 WBCs and 4+ bacteria.  Chest x-ray:  No acute    disease.       ASSESSMENT AND PLAN:   1.  Intractable  nausea and vomiting secondary to #2.   2.  UTI   3.  Coffee ground emesis with limited GI bleeding.   4.  Uncontrolled type 1 diabetes.   3.  Leukocytosis.       PLAN:   The patient will be admitted to medical service. Will get urine culture, blood    culture, start on IV fluids and IV antibiotics, IV insulin per hyperglycemic    protocol.  Will consult GI and start her on IV PPI and further    recommendations will follow.           ___________________   Theodis Aguas MD   Dictated By: .    Mackey Birchwood   D:09/19/2010   T: 09/19/2010 11:01:11   [id number]

## 2010-09-20 NOTE — Procedures (Signed)
Study ID: [id number]                                                      [facility]                                                      [address] [place], [place] [address]                                 Adult Echocardiogram Report       Name: [name] [name], [name] [name] Date: 09/20/2010 07:09 AM   MRN: [id number]              Patient Location: [id number]   DOB: 04-24-1985          Age: 25 yrs   Height: 66 in            Weight: 112 lb                                                                 BSA: 1.6 meters2   Gender: Female           Account #: [id number]   Reason For Study: CHEST PAIN   History: type 1 DM   Ordering Physician: Blane Ohara   Performed By: Nancie Neas., RDCS       Interpretation Summary   A complete two-dimensional transthoracic echocardiogram was performed (2D, M-   mode, Doppler and color flow Doppler).   The study was technically adequate.       Left ventricular systolic function is normal.   The estimated left ventricular ejection fraction is 55%.   Tiny physiologic pericardial effusion.   Thee is no previous echo for comparison.   All other findings are noted below.           _____________________________________________________________________________   __       Left Ventricle   The left ventricle is normal in size. There is normal left ventricular wall   thickness. Left ventricular systolic function is normal. The estimated left   ventricular ejection fraction is 55%. The left ventricular wall motion is   normal.           Right Ventricle   The right ventricle is normal in size and function.       Atria   The left atrial size is normal. Right atrial size is normal. The interatrial   septum is intact with no evidence for an atrial septal defect.  Mitral Valve   The mitral valve is normal in structure and function. There  is no mitral valve   stenosis. There is no mitral regurgitation noted.       Tricuspid Valve   The tricuspid valve is normal in structure and function. Right ventricular   systolic pressure is normal.       Aortic Valve   The aortic valve is normal in structure and function. No aortic stenosis. No   aortic regurgitation is present.       Pulmonic Valve   The pulmonic valve is normal in structure and function.       Great Vessels   The aortic root is normal size.           Pericardium/Pleural   Physiologic tiny pericardial effusion.       MMode/2D Measurements &amp; Calculations   IVSd: 0.97 cm                 LVIDd: 3.5 cm                                 LVIDs: 2.4 cm                                 LVPWd: 0.96 cm           _______________________________________________________________       FS: 31.2 %                    Ao root diam: 2.3 cm                                 Ao root area: 4.0 cm2                                 LA dimension: 2.4 cm       Doppler Measurements &amp; Calculations   MV E max vel: 103.7 cm/sec                 MV dec time: 0.10 sec   MV A max vel: 63.7 cm/sec   MV E/A: 1.6           _____________________________________________________________________________   __       Electronically signed byDr. Cyndia Diver, MD   09/20/2010 11:23 AM

## 2010-09-20 NOTE — Consults (Signed)
[facility]   CONSULTATION REPORT   NAME:  [place], [place]   SEX:   F   ADMIT: 09/15/2010   DATE OF CONSULT: 09/19/2010   REFERRING PHYSICIAN: SUSAN S KIM-FOLEY   DOB: [birth year]   MR#    [id number]   ROOM:     ACCT#  [id number]       cc: Wynell Balloon MD, Blane Ohara MD       REFERRING PHYSICIAN:     Dr. Darl Pikes Kim-Foley       REASON FOR CONSULTATION:   Management recommendations for sepsis, E. coli urinary tract infection,    worsening leukocytosis.       HISTORY OF PRESENT ILLNESS:   The patient is a 25 year old lady with a past medical history significant for    type-1 diabetes mellitus who was admitted to [facility] on    09/15/2010.       She presented to the emergency room on the 19th with a 1-day history of    significant nausea, vomiting and abdominal discomfort.  She had repeated    episodes of vomiting and found to have one episode of coffee-ground emesis.     She was also found to have significant pyuria and leukocytosis.  She was    placed on pain medication and gastroenterology was consulted.  She underwent    EGD on August 21, which revealed some evidence of hiatal hernia but no    evidence of active gastrointestinal bleeding.  It was thought that one episode    of coffee-ground emesis could be secondary to the Mallory-Weiss tear.     However, her WBC count on admission was 12.3 which increased to 20.5 on the    23rd.  Also, the patient complained of constant chest pain.  She had a CT scan    of the chest done on the 22nd  which did not reveal any evidence of pulmonary    embolism.  There was a concern if she had pericarditis, and a transthoracic    echocardiogram has been ordered.  I have been consulted for further management    recommendations.       The patient denies any fever or chills throughout this time.  She did not have    any fevers at home or throughout the hospitalization.  She states that her     vomiting is better at this time.  She does not recollect having abdominal pain    at home or in the emergency room.  She denied any abdominal pain at the time    of consultation.  When I saw her on the 23rd, the only area she complained of    pain was midsternal pain, which was constant, and there was no radiation in    the pain with deep breaths.  She states that her pain is worse when her    heartbeat faster.  She denies any headache, visual disturbances, sore throat,    runny nose, earaches, diarrhea, burning micturition, flank pain or increasing    pain or weakness in her extremities.  She denies noticing any rash or joint    aches or swelling throughout this time.  No recent sick contacts.  She denies    any recent history of sexually transmitted diseases, and she had an episode    of trichomoniasis many years ago.  She denies HIV risk factors.       PAST MEDICAL HISTORY:   Type 1 diabetes mellitus.  PAST SURGICAL HISTORY:   None.       FAMILY HISTORY:   Reviewed and noncontributory.       SOCIAL HISTORY:   No history of smoking, alcohol or recreational drug use.  Sexually active with    one female sexual partner.  She has been with her current partner since January    of 2012.  She denies any HIV risk factors in her current partner or the    previous partners.       REVIEW OF SYSTEMS:   A 12-point review of systems was obtained in detail.  Pertinent positives as    mentioned in history of present illness, otherwise negative.       CURRENT MEDICATIONS:   1.  Inpatient medications include Glucagon 1 mg subcutaneous p.r.n.   2.  Ondansetron 4 mg IV q.6 h. p.r.n.   3.  Protonix 40 mg IV q.12 h.   4.  Lovenox 40 mg subcutaneous q.h.s.   5.  Diphenhydramine 25 mg IV q.8 h. p.r.n.   6.  Meropenem 1 gram IV q.8  h.   7.  Ibuprofen 400 mg orally q.6 h. p.r.n.   8.  Hydrocodone/acetaminophen 2 tablets orally q.4  hours p.r.n.       ALLERGIES:   PENICILLIN.       PHYSICAL EXAMINATION:    GENERAL:  The patient sitting on the bed, alert, awake, oriented times 3 in no    apparent distress.   VITAL SIGNS:  Temperature 98.6, pulse 107, respiratory rate 15, blood pressure    142/62, oxygen saturation 100% on room air.   HEENT:  Pupils equal and reactive.  No conjunctival erythema, no thrush, no    pharyngitis.   NECK:  No jugular venous distension, no lymphadenopathy.   CHEST:  Bilateral air entry fair.  Clear to auscultation.  No rhonchi or rales    appreciated.   CARDIOVASCULAR:  Regular rate and rhythm.  No murmurs, gallops or rubs    appreciated.   ABDOMEN:  Soft, nontender, normal bowel sounds.  No guarding, no rigidity, no    organomegaly.   EXTREMITIES:  No clubbing, cyanosis or edema.  No joint swelling or erythema    noted.   BACK:  No spinal or paraspinal muscle tenderness or rigidity.  No    costovertebral angle tenderness.   NEUROLOGIC:  No gross motor or sensory deficits noted.   SKIN:  No rash or ulcers noted.       LABORATORY DATA:   On August 23 were significant for a troponin of less than 0.015.  Sodium 133,    potassium 4, bicarbonate 12, BUN 12, creatinine 1.  Liver function tests    within normal limits.  WBC 20.5, hemoglobin 12, hematocrit 35.1, platelets    269, neutrophils 81%, lymphocytes 13%.  Urine culture done on the 19th greater    than 100,000 colonies of E. coli (susceptibilities reviewed).  Urine    pregnancy test done on the 19th negative.  Urinalysis done on the 19th nitrite    positive, leukocyte esterase trace, pH 8.5.  Urine WBCs 30 to 49, bacteria    4+.  Ultrasound of the kidneys done on the 20th showed no evidence of mass,    calculus, or hydronephrosis noted.  CT scan of the chest results as mentioned    in history of present illness.       IMPRESSION:   A 25 year old lady with a past medical history significant for  diabetes,    admitted on the 19th with the following problems:   1.  Intractable nausea and vomiting.   2.  Increasing leukocytosis.  WBC 20.5.    3.  Escherichia coli in the urine culture.   3.  Midsternal chest pain for 2 days.       The patient presents with a highly complex clinical picture.  The exact    etiology of leukocytosis is not entirely clear at this time.  The differential    diagnosis includes a complicated urinary tract infection/undiagnosed pelvic    inflammatory disease/ pericarditis/noninfectious etiologies.       Although the patient had significant pyuria on the urinalysis on admission and    significant colony count of E. coli in the urine culture, the lack of urinary    tract symptoms and lack of improvement in leukocytosis despite meropenem    rules against sepsis secondary to E. coli urinary tract infection.  On the    other hand, the significant pyuria, significant colony counts of E. coli in    the urine culture and the postcoital pain since about 2 to 3 weeks would be    concerning for a cystitis.  Hence, I would recommend continuation of current    antibiotics until further information is obtained.       The midsternal chest pain could be secondary to Mallory-Weiss tear due to    severe vomiting.  However, I agree with ruling out pericarditis.       Although patient does not have abdominal pain or tenderness at this time, it    appears from the emergency room records that she did complain of some    abdominal discomfort on presentation.  Hence, I would like to obtain abdominal    imaging studies to rule out obstructive uropathy or an undiagnosed    intra-abdominal infection.       Also, not noninfectious etiologies like ____ disease can cause Significant    leukocytosis.  However, the patient does not have other signs or symptoms to    fulfill this diagnosis.       RECOMMENDATIONS:   1.  Obtain CT scan of abdomen and pelvis to rule out obstructive uropathy.   2.  Discontinue meropenem if no evidence of obstructive uropathy noted.   3.  Repeat urinalysis and urine culture.   4.  Follow up the results of the echocardiogram.    5.  Obtain rapid HIV test.   6.  Obtain serum ANA and ferritin.   7.  Follow up CBCs clinically.   8.  Further recommendations based on the clinical course and above test    results.   The above plan was discussed in details with Dr. Tenny Craw and the patient.     Thank you for the consultation request.  Please call if any further questions.     Will continue to participate in the care of this patient.           ___________________   Raiford Simmonds MD   Dictated By:.    Edmonia Caprio   D:09/20/2010   T: 09/20/2010 14:04:25   [id number]

## 2010-09-20 NOTE — Procedures (Signed)
Study ID: [id number]                                                      [facility]                                                      [address] [place], [place] [address]                                 Adult Echocardiogram Report       Name: [name] [name], [name] [name] Date: 09/20/2010 07:09 AM   MRN: [id number]              Patient Location: [id number]   DOB: 01/24/1986          Age: 25 yrs   Height: 66 in            Weight: 112 lb                                                                 BSA: 1.6 meters2   Gender: Female           Account #: [id number]   Reason For Study: CHEST PAIN   History: type 1 DM   Ordering Physician: Blane Ohara   Performed By: Nancie Neas., RDCS       Interpretation Summary   A complete two-dimensional transthoracic echocardiogram was performed (2D, M-   mode, Doppler and color flow Doppler).   The study was technically adequate.       Left ventricular systolic function is normal.   The estimated left ventricular ejection fraction is 55%.   Tiny physiologic pericardial effusion.   Thee is no previous echo for comparison.   All other findings are noted below.           _____________________________________________________________________________   __       Left Ventricle   The left ventricle is normal in size. There is normal left ventricular wall   thickness. Left ventricular systolic function is normal. The estimated left   ventricular ejection fraction is 55%. The left ventricular wall motion is   normal.           Right Ventricle   The right ventricle is normal in size and function.       Atria   The left atrial size is normal. Right atrial size is normal. The interatrial   septum is intact with no evidence for an atrial septal defect.  Mitral Valve    The mitral valve is normal in structure and function. There is no mitral valve   stenosis. There is no mitral regurgitation noted.       Tricuspid Valve   The tricuspid valve is normal in structure and function. Right ventricular   systolic pressure is normal.       Aortic Valve   The aortic valve is normal in structure and function. No aortic stenosis. No   aortic regurgitation is present.       Pulmonic Valve   The pulmonic valve is normal in structure and function.       Great Vessels   The aortic root is normal size.           Pericardium/Pleural   Physiologic tiny pericardial effusion.       MMode/2D Measurements &amp; Calculations   IVSd: 0.97 cm                 LVIDd: 3.5 cm                                 LVIDs: 2.4 cm                                 LVPWd: 0.96 cm           _______________________________________________________________       FS: 31.2 %                    Ao root diam: 2.3 cm                                 Ao root area: 4.0 cm2                                 LA dimension: 2.4 cm       Doppler Measurements &amp; Calculations   MV E max vel: 103.7 cm/sec                 MV dec time: 0.10 sec   MV A max vel: 63.7 cm/sec   MV E/A: 1.6           _____________________________________________________________________________   __       Electronically signed byDr. Cyndia Diver, MD   09/20/2010 11:23 AM

## 2010-09-22 NOTE — Discharge Summary (Signed)
[facility]   Discharge Summary    NAME:  [name] [name], [name] [name]   SEX:   F   ADMIT: 09/15/2010   DISCH: 09/22/2010   DOB:   1985/04/28   MR#    [id number]   ACCT#  [id number]       cc: Wynell Balloon MD       DATE OF ADMISSION:   09/15/2010       DATE OF DISCHARGE:   09/22/2010       DISCHARGE DIAGNOSES:   1.  Urinary tract infection and cystitis with Escherichia coli.   2.  Abdominal pain, resolved.   3.  Type 1 diabetes, uncontrolled.   4.  Mild diabetic ketoacidosis, resolved.   5.  Intractable nausea and vomiting, resolved.   6.  Coffee ground emesis, status post esophagogastroduodenoscopy which is    unremarkable.       CONSULTATIONS:   Infectious disease, Dr. Allena Katz.     GI, Dr. Marilynn Rail.       PROCEDURE:   EGD.       SUMMARY:    The patient is a 26 year old female with a past medical history of type 1    diabetes on insulin with Lantus and Humalog premeal.  She presented with chief    complaint of intractable nausea, vomiting and abdominal pain with    coffee-ground emesis.  She was found to have UTI, leukocytosis and    hyperglycemia and admitted for further evaluation and management.  Please    review H&amp;P dictated on date of admission for detailed information.       HOSPITAL COURSE:   The patient was admitted to medical floor.  She had blood culture, urine    culture. Urine culture grew E. coli which is resistant to Cipro.  Initially    she was started on IV meropenem.  She has renal ultrasound with no evidence of    pyelonephritis or hydronephrosis.  The patient had intractable nausea and    vomiting on admission with episodes of coffee-ground emesis.  She had NGI on    admission.  She also had an EGD that showed hiatal hernia, otherwise    unremarkable.  No evidence of bleeding.  She was given IV PPI.  Her nausea and    vomiting has been resolved.  Most likely that is secondary to her UTI as well    as the DKA.  The patient was complaining of retrosternal chest pain and     tachycardia.  She had CT of the chest that was negative for PE and overall it    is unremarkable.  She also had CT of the abdomen that also was unremarkable.     The patient complains of vaginal bleeding during her hospital stay.     Gynecology has been consulted.  She also has a pelvic ultrasound which is    otherwise negative.  The patient now remains afebrile.  No nausea and    vomiting.  Her antibiotic has been changed to p.o. and the patient will be    discharged home today in stable condition.       DISCHARGE MEDICATIONS:   Omnicef 300 mg p.o. q12 h. for 4 days, Lantus 20 units at bedtime, Humalog    sliding scale.       FOLLOWUP:   The patient will see her primary care physician in 1 to 2 weeks.       Total discharge time took  35 minutes.  ___________________   Theodis Aguas MD   Dictated By: .    NG   D:09/22/2010   T: 09/22/2010 11:58:07   [id number]

## 2010-11-25 NOTE — Procedures (Signed)
Test Reason : Chest pain   Blood Pressure : ***/*** mmHG   Vent. Rate : 094 BPM     Atrial Rate : 094 BPM      P-R Int : 136 ms          QRS Dur : 076 ms       QT Int : 366 ms       P-R-T Axes : 076 074 043 degrees      QTc Int : 457 ms   Normal sinus rhythm   Normal ECG   When compared with ECG of 18-Sep-2010 21:01,   Nonspecific T wave abnormality now evident in Anterior leads   Confirmed by Phil Dopp, M.D., Ronald ([id number]) on 11/26/2010 4:41:07 PM   Referred By:             Overread By: Lance Muss, M.D.

## 2010-11-25 NOTE — ED Provider Notes (Signed)
MEDICATION ADMINISTRATION SUMMARY              Drug Name: *Sodium Chloride 0.9%, Intravenous, Dose Ordered: 1000 mL,         Route: IV Fluid, Status: Given, Time: 21:54 11/25/2010,          Drug Name: Morphine Sulfate, Dose Ordered: 4 mg, Route: IV Push,         Status: Given, Time: 20:45 11/25/2010,          Drug Name: Novolin R, Dose Ordered: 10 units, Route: Subcutaneous,         Status: Given, Time: 19:25 11/25/2010,          Drug Name: Novolin R, Dose Ordered: 10 units, Route: Subcutaneous,         Status: Given, Time: 17:45 11/25/2010,          Drug Name: Morphine Sulfate, Dose Ordered: 4 mg, Route: IV Push,         Status: Given, Time: 16:53 11/25/2010,          Drug Name: Zofran, Dose Ordered: 4 mg, Route: IV Push, Status: Given,         Time: 16:51 11/25/2010,          Drug Name: *Sodium Chloride 0.9%, Intravenous, Dose Ordered: 1000 mL,         Route: IV Fluid, Status: Given, Time: 16:50 11/25/2010, *Additional         information available in notes, Detailed record available in         Medication Service section.       KNOWN ALLERGIES   Penicillins       TRIAGE (Mon Nov 25, 2010 16:18 MNL1)   PATIENT: NAME: [name] [name], AGE: 25, GENDER: female, DOB: [birth year]         10-16-1985, TIME OF GREET: Mon Nov 25, 2010 16:11, SSN: [id number],         KG WEIGHT: 55.3, HEIGHT: 167cm, MEDICAL RECORD NUMBER: [phone],         ACCOUNT NUMBER: [id number], PCP: Melton Krebs,. (Mon Nov 25, 2010 16:18 MNL1)   ADMISSION: URGENCY: 2, TRANSPORT: Wheelchair, DEPT: Emergency,         BED: WAITING. (Mon Nov 25, 2010 16:18 MNL1)   VITAL SIGNS: BP 115/78, (Lying), Pulse 109, Resp 20, Temp 97.9,         (Oral), Pain 9, O2 Sat 100, on Room air, Time 11/25/2010 16:16.         (16:16 MNL1)   COMPLAINT:  High Blood Sugar Cp. Sheral Flow Nov 25, 2010 16:18         MNL1)   PRESENTING COMPLAINT:  Nausea and vomiting today. Out of Lantus         insulin for 4 days. (16:42 MNL1)    PAIN: Patient complains of pain, On a scale 0-10 patient rates         pain as 9. (16:42 MNL1)   TB SCREENING: TB screen negative for this patient. (16:42         MNL1)   ABUSE SCREENING: Patient denies physical abuse or threats. (16:42         MNL1)   FALL RISK: Patient has a low risk of falling. (16:42 MNL1)   SUICIDAL IDEATION: Suicidal ideation is not present. (16:42         MNL1)   ADVANCE DIRECTIVES: Patient does not have advance directives.         (  16:42 MNL1)   PROVIDERS: TRIAGE NURSE: Trecia Rogers, RN, CEN. (Mon Nov 25, 2010 16:18 MNL1)   PREVIOUS VISIT ALLERGIES: Penicillins. (Mon Nov 25, 2010 16:18         MNL1)       PRESENTING PROBLEM (16:18 MNL1)      Presenting problems: Elevated Blood Sugar, Nausea, Vomiting.       CURRENT MEDICATIONS (16:19 MNL1)   Lantus:  30 units Subcutaneous once a day (at bedtime). Last         Taken: 10/26.   Humalog:  SSC See Notes. insulin pump - titrate according to         glucose levels.       MEDICATION SERVICE   Morphine Sulfate:  Order: Morphine Sulfate - Dose: 4 mg         : IV Push         Ordered by: Phil Dopp, PA-C         Entered by: Phil Dopp, PA-C Mon Nov 25, 2010 16:44 ,          Acknowledged by: Trecia Rogers, RN, CEN Mon Nov 25, 2010 16:48         Documented as given by: Trecia Rogers, RN, CEN Mon Nov 25, 2010         16:53          Patient, Medication, Dose, Route and Time verified prior to         administration.          Amount given: 4mg , IV SITE #1 into left wrist, IV SITE #1 IVP,         subsequent different medication, Slowly, Connections checked prior to         administration, Line traced prior to administration, Catheter         placement confirmed via flush prior to administration, IV site         without signs or symptoms of infiltration during medication         administration, No swelling during administration, No drainage during         administration, IV flushed after administration, Correct patient,          time, route, dose and medication confirmed prior to administration,         Patient advised of actions and side-effects prior to administration,         Allergies confirmed and medications reviewed prior to administration,         Patient in position of comfort, Side rails up, Cart in lowest         position, Family at bedside, Call light in reach.    : Follow Up : Response assessment performed, No signs or         symptoms of allergic reaction noted, Decreased pain, Decreased         symptoms, Decreased heart rate, Decreased vomiting, Decreased nausea,         _IV SITE #1:_. (17:33 MNL1)   Morphine Sulfate:  Order: Morphine Sulfate - Dose: 4 mg         : IV Push         Ordered by: Carolin Guernsey, DO         Entered by: Carolin Guernsey, DO Mon Nov 25, 2010 20:31 ,          Acknowledged by: Saverio Danker, RN Mon Nov 25, 2010 20:36  Documented as given by: Saverio Danker, RN Mon Nov 25, 2010 20:45          Patient, Medication, Dose, Route and Time verified prior to         administration.          Amount given: 4mg , IV SITE #1 IVP, repeat same medication, Slowly,         Connections checked prior to administration, Line traced prior to         administration, Catheter placement confirmed via flush prior to         administration, IV site without signs or symptoms of infiltration         during medication administration, No swelling during administration,         No drainage during administration, IV flushed after administration,         Correct patient, time, route, dose and medication confirmed prior to         administration, Patient advised of actions and side-effects prior to         administration, Allergies confirmed and medications reviewed prior to         administration.   Novolin R:  Order: Novolin R (Insulin Regular Human Recombinant)         - Dose: 10 units : Subcutaneous         Ordered by: Phil Dopp, PA-C          Entered by: Phil Dopp, PA-C Mon Nov 25, 2010 17:34 ,          Acknowledged by: Trecia Rogers, RN, CEN Mon Nov 25, 2010 17:43         Documented as given by: Trecia Rogers, RN, CEN Mon Nov 25, 2010         17:45          Patient, Medication, Dose, Route and Time verified prior to         administration.          Time given: 1745, Amount given: 10units, Medication administered to         left upper arm, Correct patient, time, route, dose and medication         confirmed prior to administration, Patient advised of actions and         side-effects prior to administration, Allergies confirmed and         medications reviewed prior to administration, Advised not to ambulate         without assistance, Patient in position of comfort, Side rails up,         Cart in lowest position, Family at bedside, Call light in reach.    : Follow Up :  Glucose 305mg /dl. (18:47 MNL1)   Novolin R:  Order: Novolin R (Insulin Regular Human Recombinant)         - Dose: 10 units : Subcutaneous         Ordered by: Phil Dopp, PA-C         Entered by: Phil Dopp, PA-C Mon Nov 25, 2010 19:00 ,          Acknowledged by: Saverio Danker, RN Mon Nov 25, 2010 19:16         Documented as given by: Saverio Danker, RN Mon Nov 25, 2010 19:25          Patient, Medication, Dose, Route and Time verified prior to         administration.  Time given: 1925, Amount given: 10 units, Medication administered to         right abdomen, Correct patient, time, route, dose and medication         confirmed prior to administration, Patient advised of actions and         side-effects prior to administration, Allergies confirmed and         medications reviewed prior to administration.   Sodium Chloride 0.9%, Intravenous:  Order: Sodium Chloride 0.9%,         Intravenous (Sodium Chloride) - Dose: 1000 mL : IV Fluid         Notes: Bolus         *Reminder* Enter reason for fluid below         For Hydration          Ordered by: Phil Dopp, PA-C         Entered by: Phil Dopp, PA-C Mon Nov 25, 2010 16:43 ,          Acknowledged by: Trecia Rogers, RN, CEN Mon Nov 25, 2010 16:48         Documented as given by: Trecia Rogers, RN, CEN Mon Nov 25, 2010         16:50          Patient, Medication, Dose, Route and Time verified prior to         administration.          Amount given: , IV SITE #1 into left wrist, IV SITE #1 IV         fluids established, IV SITE #1 1st bag hung, amount 1 Liter, IV SITE         #1 bolus of 1000 ml established, IV SITE #1 Rate of bolus, wide open,         via primary tubing, Connections checked prior to administration, Line         traced prior to administration, Catheter placement confirmed via         flush prior to administration, IV site without signs or symptoms of         infiltration during medication administration, No swelling during         administration, No drainage during administration, IV flushed after         administration, Correct patient, time, route, dose and medication         confirmed prior to administration, Patient advised of actions and         side-effects prior to administration, Allergies confirmed and         medications reviewed prior to administration, Patient in position of         comfort, Side rails up, Cart in lowest position, Family at bedside,         Call light in reach.    : Follow Up : _IV SITE #1:_, IV fluid infusion discontinued, on         Mon Nov 25, 2010 17:34, 45 minutes, ., Total amount infused: .         (17:34 MNL1)   Sodium Chloride 0.9%, Intravenous:  Order: Sodium Chloride 0.9%,         Intravenous (Sodium Chloride) - Dose: 1000 mL : IV Fluid         Notes: Bolus         *Reminder* Enter reason for fluid below         For Hydration  Ordered by: Carolin Guernsey, DO         Entered by: Carolin Guernsey, DO Mon Nov 25, 2010 20:57 ,          Acknowledged by: Saverio Danker, RN Mon Nov 25, 2010 21:15          Documented as given by: Saverio Danker, RN Mon Nov 25, 2010 21:54          Patient, Medication, Dose, Route and Time verified prior to         administration.          IV SITE #1 into left wrist, IV SITE #1 IV fluids established, IV         SITE #1 2nd bag hung, amount 1 Liter, IV SITE #1 bolus of 1000 ml         established, IV SITE #1 Rate of bolus, wide open, via primary tubing.    : Follow Up : _IV SITE #1:_, IV fluid infusion discontinued, on         Mon Nov 25, 2010 23:11, Total fluid hydration time IV site 1 1 hour,         ., Total amount infused: . (23:11 MAG1)   Zofran:  Order: Zofran (Ondansetron Hydrochloride) -         Dose: 4 mg : IV Push         Ordered by: Phil Dopp, PA-C         Entered by: Phil Dopp, PA-C Mon Nov 25, 2010 16:43 ,          Acknowledged by: Trecia Rogers, RN, CEN Mon Nov 25, 2010 16:48         Documented as given by: Trecia Rogers, RN, CEN Mon Nov 25, 2010         16:51          Patient, Medication, Dose, Route and Time verified prior to         administration.          Amount given: 4mg , IV SITE #1 into left wrist, IV SITE #1 IVP,         initial medication, Slowly, Connections checked prior to         administration, Line traced prior to administration, Catheter         placement confirmed via flush prior to administration, IV site         without signs or symptoms of infiltration during medication         administration, No swelling during administration, No drainage during         administration, IV flushed after administration, Correct patient,         time, route, dose and medication confirmed prior to administration,         Patient advised of actions and side-effects prior to administration,         Allergies confirmed and medications reviewed prior to administration,         Patient in position of comfort, Side rails up, Cart in lowest         position, Family at bedside, Call light in reach.     : Follow Up : Response assessment performed, No signs or         symptoms of allergic reaction noted, Decreased symptoms, Decreased         vomiting, Decreased nausea, _IV SITE #1:_. (17:34 MNL1)       ORDERS   CHEST 2 VIEWS:  Ordered for: Wenda Overland, DO, Jennifer         Status: Active. (16:43 MMA)   CBC, AUTOMATED DIFFERENTIAL:  Ordered for: Wenda Overland, DO,         Jennifer         Status: Done by System Mon Nov 25, 2010 17:06. (16:43 MMA)   COMPREHENSIVE METABOLIC PANEL:  Ordered for: Wenda Overland, DO,         Jennifer         Status: Done by System Mon Nov 25, 2010 17:29. (16:43 MMA)   Urine HCG:  Ordered for: Wenda Overland, DO, Jennifer         Status: Done by Dalene Carrow RN, CEN, Altamese Dilling Nov 25, 2010 17:27.         (16:43 MMA)   Urine dip (send for lab U/A if positive):  Ordered for: Wenda Overland, DO, Jennifer         Status: Done by Dalene Carrow RN, CEN, Altamese Dilling Nov 25, 2010 17:27.         (16:43 MMA)   12 LEAD EKG:  Ordered for: Wenda Overland, DO, Jennifer         Status: Active. (16:43 MMA)   LIPASE:  Ordered for: Himmel Salch, DO, Jennifer         Status: Done by System Mon Nov 25, 2010 17:29. (16:43 MMA)   Blood Glucose q 1hr:  Ordered for: Wenda Overland, DO, Jennifer         Status: Done by Ezequiel Essex RN, Etta Quill Nov 25, 2010 19:16. (17:36         MMA)   Elita Boone IV Cath:  Ordered for: Wenda Overland, DO, Jennifer         Status: Active. (20:22 MAG1)   IV Set - Primary Tubing:  Ordered for: Himmel Salch, DO, Jennifer         Status: Active. (20:22 MAG1)   IV Start kit:  Ordered for: Himmel Salch, DO, Jennifer         Status: Active. (20:22 MAG1)   IV Set - Primary Tubing:  Ordered for: Himmel Salch, DO, Jennifer         Status: Active. (21:54 MAG1)       NURSING ASSESSMENT: ABDOMEN (16:47 MNL1)   CONSTITUTIONAL: Complex assessment performed, History obtained         from patient, Patient arrives, via hospital wheelchair, Gait steady,          Patient appears, uncomfortable, Patient cooperative, Patient alert,         Oriented to person, place and time, Skin warm, Skin dry, Skin normal         in color, Mucous membranes pink, Mucous membranes moist.   ABDOMEN: Abdomen assessment findings include abdomen symmetrical,         Abdomen soft, non-tender, no pulsatile mass, Bowel sound normal,         Associated with nausea, Associated with vomiting.   SAFETY: Side rails up, Cart/Stretcher in lowest position, Call         light within reach, Hospital ID band on.       NURSING PROCEDURE: DISCHARGE NOTE (23:10 MAG1)   DISCHARGE: Patient discharged to home, in a wheelchair, friend         driving, accompanied by friend, Discharge instructions given to         patient, Simple or moderate discharge teaching performed,  Prescriptions given and instructions on side effects given, Name of         prescription(s) given: Motrin, Above person(s) verbalized         understanding of discharge instructions and follow-up care, Patient         instructed not to drive home.       NURSING PROCEDURE: EKG CHART (17:07 MNL1)   EKG: EKG indicated for complaint of chest pain, 12 lead EKG         performed on the left chest, first EKG.   FOLLOW-UP: After procedure, EKG for interpretation given to Dr.         Sheela Stack.       NURSING PROCEDURE: IV   PATIENT IDENITIFIER: Patient's identity verified by patient         stating name, Patient's identity verified by patient stating birth         date. (16:27 Clemente.[email or web address])     Patient's identity verified by patient stating name, Patient's identity         verified by patient stating birth date. (22:47 MAG1)   IV SITE 1: IV established, to the left wrist, using a 20 gauge         catheter, in one attempt, Flushed with normal saline (mls): 10, Labs         drawn at time of placement, labeled in the presence of the patient         and sent to lab. (16:27 ECW0)   FOLLOW-UP SITE 1: After procedure, sterile transparent dressing          applied. (16:27 ECW0)     IV discontinued, as ordered, due to patient being discharged, catheter         intact, After removal, sterile dressing applied to IV site. (22:47         MAG1)   SAFETY: Side rails up, Cart/Stretcher in lowest position, Family         at bedside, Call light within reach, Hospital ID band on. (16:27         ECW0)       NURSING PROCEDURE: TRANSPORT TO TESTS   PATIENT IDENTIFIER: Patient's identity verified by patient         stating name, Patient's identity verified by patient stating birth         date, Patient's identity verified by hospital ID bracelet. (17:04         JNO)     Patient's identity verified by hospital ID bracelet. (17:17 JNO)   TRANSPORT TO TESTS: Patient transported to x-ray, via wheelchair,         Accompanied by x-ray technician, Hand-off report received from         [place], RN, [employer], [place]. (17:04 JNO)   FOLLOW-UP: After procedure, patient returned to emergency         department, Hand-off report was given to [place], RN, [employer], [place].         (17:17 JNO)       DIAGNOSIS (22:24 MMA)   FINAL: PRIMARY: Hyperglycemia, noncompliant, ADDITIONAL: chest         pain, likely musculoskeletal.       DISPOSITION   PATIENT:  Disposition Type: Discharged, Disposition: Discharged,         Condition: Stable. (22:24 MMA)      Patient left the department. (23:12 MAG1)       INSTRUCTION (22:25 MMA)   DISCHARGE:  HYPERGLYCEMIA (ELEVATED BLOOD GLUCOSE, HIGH SUGAR).  FOLLOWUPMelton Krebs, [facility], [address]         [address], [place] [address], [phone].   SPECIAL:  Follow up with primary care physician         Take prescribed medication(s) as directed. Take your insulin as         directed.          Return to the ER if condition worsens or new symptoms develop.   Key:     ECW0=[place], ACT III, Edward (Ned)  JHS2=Himmel Providence, DO, Victorino Dike      JNO=Ogdin, RAD TECH, Dorene Sorrow     MAG1=[place], RN, [employer], PA-C, [employer]  MNL1=[place], RN,      [employer], [employer]

## 2010-11-25 NOTE — ED Provider Notes (Signed)
[facility]   EMERGENCY DEPARTMENT TREATMENT REPORT   NAME:  [name] [name]   SEX:   F   ADMIT: 11/25/2010   DOB:   12/17/1985   MR#    [id number]   ROOM:     TIME SEEN: 05 09 PM   ACCT#  [id number]       cc: Wynell Balloon MD       DATE OF SERVICE:    11/24/2100.       PRIMARY CARE PHYSICIAN:   Wynell Balloon, MD       TIME OF EVALUATION:   1635.       CHIEF COMPLAINT:   Chest pain, vomiting and elevated blood sugar.       HISTORY OF PRESENT ILLNESS:   This is a 25 year old female insulin-dependent diabetic who states she ran out    of her Lantus approximately 3 days ago.  She is still taking her Humalog and    around 0400 this morning when she checked her sugar it was almost 600.     Additionally, she reports acute onset of nausea and vomiting, since this    morning has had 6 episodes of emesis which she describes as a clear phlegm.     She denies hematemesis.  Denies any abdominal pain, no diarrhea or fevers.     Additionally around 1430 she developed midsternal aching chest pain.  It is    nonradiating.  She denies any associated shortness of breath or pleuritic type    pain, no cough or hemoptysis.  She has not tried taking any over-the-counter    medications to treat her pain.  Denies any alleviating or aggravating    factors.  Since onset her pain has been constant.       REVIEW OF SYSTEMS:   CONSTITUTIONAL:  No fevers or chills.   ENT:  No sore throat, rhinorrhea.   RESPIRATORY:  No dyspnea or pleuritic pain.   CARDIOVASCULAR:  Chest pain.   GASTROINTESTINAL:  Nausea, vomiting, no abdominal pain or diarrhea.   GENITOURINARY:  No dysuria.   MUSCULOSKELETAL:  No extremity pain.   INTEGUMENTARY:  No rash.   NEUROLOGICAL:  No headache.       PAST MEDICAL HISTORY:   Insulin-dependent diabetes.       FAMILY HISTORY:   Noncontributory.       SOCIAL HISTORY:   Lives with family members.       MEDICATIONS:   Reviewed in Ibex.       ALLERGIES:   PENICILLIN.       PHYSICAL EXAMINATION:    VITAL SIGNS:  Blood pressure 115/78, pulse 109, respirations 20, temperature    97.9, O2 saturation 100% on room air, pain rated 9 out of 10.   GENERAL APPEARANCE:  The patient appears well developed and well nourished.     Appearance and behavior are age and situation appropriate.  She is lying on    stretcher, appears uncomfortable, but nontoxic.   HEENT:  Head normocephalic, atraumatic.  Eyes:  Conjunctivae are clear.  Lids    are normal.  Mouth and throat:  Oral mucosa appears pink and moist.  No    lesions.   NECK:  Supple, nontender, symmetrical.   LYMPHATICS:  No cervical or submandibular lymphadenopathy palpated.    RESPIRATORY:  Lungs are clear to auscultation bilaterally.  No wheezes, rales    or rhonchi.   CARDIOVASCULAR:  Tachycardic, regular rhythm.  No murmurs, rubs or gallops.  No peripheral edema.  Chest is symmetrical without masses.  There is    reproducible tenderness to the midsternal region extending into the    epigastrium.   GASTROINTESTINAL:  Abdomen is soft, nondistended.  Tenderness to palpation to    the epigastrium.  No additional areas of tenderness are elicited.   MUSCULOSKELETAL:  Negative cva tenderness.  Moving all extremities    appropriately.   SKIN:  Warm and dry without rashes.   NEUROLOGIC:  The patient is alert, oriented, answering questions    appropriately.       CONTINUATION BY MICHELLE ADAMS, PA-C:        INITIAL ASSESSMENT AND MANAGEMENT PLAN:      This is a 25 year old female insulin-dependent diabetic, noncompliant with her    medications, presenting with 1-day history of nausea, vomiting, elevated    blood sugar and chest pain.  Today we will check labs, sent for a chest x-ray,    EKG, urinalysis, rehydrate the patient, administer antiemetics, pain control    in an attempt to bring her sugar within normal limits.       DIAGNOSTIC STUDIES:   Dr. Wenda Overland did not see any acute S-T segment or T-wave abnormalities     that are consistent with acute ischemia or infarction.  Her initial    fingerstick glucose was 359.  CBC with white blood cell count 12.6, hemoglobin    and hematocrit 11.8 and 35.2.  Urinalysis shows large blood, greater than 160    ketones, and 500 glucose.  Urine pregnancy test is negative.  Chest x-ray is    read by radiology as normal chest.  CMP with sodium 131, carbon dioxide of    19, blood glucose 379.  Lipase within normal limits.       COURSE IN THE EMERGENCY DEPARTMENT:   The patient remained stable throughout her stay, initially was given 1 liter    normal saline bolus along with 4 mg of IV Zofran for nausea and vomiting along    with 4 mg of IV morphine for her chest pain.  Initially anticipated her sugar    to come down with just IV bolus, however, after her 1 liter, it had only come    down to 305, she was then started on insulin protocol.  She was given another    1 liter normal saline bolus with a total of 20 units of subcutaneous insulin.     Repeat Accu-Cheks showed significant improvement and her sugar came down    finally to 170.  At time of my reassessment, patient reported improvement in    her pain as well as her nausea and she felt as though she could go home.  I    discussed at length with patient the need for her to continue her insulin    therapy.  She states she is currently out of her Lantus and cannot afford to    have it refilled until the end of the week when she gets paid.  I have    instructed her to substitute her Lantus as she cannot afford it with her    Humalog and she is to adjust it according to sliding scale.  She understands    and will do as directed.  At this time as her blood sugar has come down to    more acceptable range and her vital signs are all within normal limits, she    appears stable for discharge home with PCP  followup.       CLINICAL IMPRESSION AND DIAGNOSES:   1.  Hyperglycemia, noncompliant.   2.  Chest pain, likely musculoskeletal.        DISPOSITION AND PLAN:   The patient is discharged home in stable condition.  Instructed to follow up    with her primary care physician to fill her insulin and start taking as    directed.  In the meantime, she is instructed to substitute her Lantus with    her Humalog.  She is given a prescription for 800 mg Motrin for further pain    relief.  Advised to return to the Emergency Department for any worsening or    symptoms of concern.  The patient was personally evaluated by myself and Dr.    Victorino Dike Himmel-Salch who agrees with the above assessment and plan.           ___________________   Liberty Handy Himmel-Salch DO   Dictated By: Zachary George. Pernell Dupre, PA-C       My signature above authenticates this document and my orders, the final    diagnosis (es), discharge prescription (s), and instructions in the [facility]    Pulsecheck record.   DH2   D:11/25/2010   T: 11/25/2010 16:10:96   [id number]

## 2010-11-25 NOTE — Procedures (Signed)
Test Reason : Chest pain   Blood Pressure : ***/*** mmHG   Vent. Rate : 094 BPM     Atrial Rate : 094 BPM      P-R Int : 136 ms          QRS Dur : 076 ms       QT Int : 366 ms       P-R-T Axes : 076 074 043 degrees      QTc Int : 457 ms   Normal sinus rhythm   Normal ECG   When compared with ECG of 18-Sep-2010 21:01,   Nonspecific T wave abnormality now evident in Anterior leads   Confirmed by McKechnie, M.D., Ronald ([id number]) on 11/26/2010 4:41:07 PM   Referred By:             Overread By: Ronald McKechnie, M.D.

## 2010-11-26 NOTE — Procedures (Signed)
Test Reason : Other Reasons-Enter in Comments   Blood Pressure : ***/*** mmHG   Vent. Rate : 115 BPM     Atrial Rate : 115 BPM      P-R Int : 114 ms          QRS Dur : 068 ms       QT Int : 310 ms       P-R-T Axes : 085 079 038 degrees      QTc Int : 428 ms   Sinus tachycardia   Otherwise normal ECG   When compared with ECG of 25-Nov-2010 16:59,   Non-specific change in ST segment in Inferior leads   Non-specific change in ST segment in Anterior leads   Confirmed by Alene Mires, M.D., Verdon Cummins [phone]) on 11/27/2010 12:04:33 PM   Referred By:             Overread By: Gita Kudo, M.D.

## 2010-11-26 NOTE — Procedures (Signed)
Test Reason : Other Reasons-Enter in Comments   Blood Pressure : ***/*** mmHG   Vent. Rate : 115 BPM     Atrial Rate : 115 BPM      P-R Int : 114 ms          QRS Dur : 068 ms       QT Int : 310 ms       P-R-T Axes : 085 079 038 degrees      QTc Int : 428 ms   Sinus tachycardia   Otherwise normal ECG   When compared with ECG of 25-Nov-2010 16:59,   Non-specific change in ST segment in Inferior leads   Non-specific change in ST segment in Anterior leads   Confirmed by St. Clair, M.D., Jesse ([id number]) on 11/27/2010 12:04:33 PM   Referred By:             Overread By: Jesse St. Clair, M.D.

## 2010-11-26 NOTE — ED Provider Notes (Signed)
[facility]   EMERGENCY DEPARTMENT TREATMENT REPORT   NAME:  [place], [place]   SEX:   F   ADMIT: 11/26/2010   DOB:   05-25-1985   MR#    [id number]   ROOM:     TIME SEEN: 11 16 PM   ACCT#  [id number]               I  hereby certify this patient for admission based upon medical necessity as    noted below.       PRIMARY CARE PHYSICIAN:   Wynell Balloon, MD       TIME OF EVALUATION:   1905.       CHIEF COMPLAINT:   Vomiting, elevated blood sugar.       HISTORY OF PRESENT ILLNESS:   This is a 25 year old female, insulin-dependent diabetic, presenting with    complaints of vomiting and elevated blood sugar.  I saw this patient yesterday    for evaluation of same.  She was discharged home, feeling better at the time,    with resolution of her nausea.  She states shortly after her arrival home    yesterday evening, she began to feel nauseated again and had several episodes    of vomiting.  As she has been unable to keep any food down, she has not taken    her insulin today, although she has checked it and around 2 p.m. this    afternoon it was in the 300s.  Due to her ongoing vomiting and elevated blood    sugars, she returned to the Emergency Department at this time for further    evaluation and management.  She denies any abdominal pain, dysuria, hematuria,    fevers or chills.  She is continuing to have some midsternal chest pain that    she also experienced yesterday.  This has been occurring intermittently.     Denies any associated pleuritic pain or shortness of breath.       REVIEW OF SYSTEMS:   CONSTITUTIONAL:  No fevers or chills.   EYES:  No blurry vision.   ENT:  No sore throat, rhinorrhea.   RESPIRATORY:  No dyspnea or pleuritic pain.   CARDIOVASCULAR:  Chest pain.   GASTROINTESTINAL:  Vomiting, no abdominal pain or diarrhea.   GENITOURINARY:  No dysuria, hematuria.   MUSCULOSKELETAL:  No extremity pain.   INTEGUMENTARY:  No rash.   NEUROLOGICAL:  No headache.       PAST MEDICAL HISTORY:    Insulin-dependent diabetic.       FAMILY HISTORY:   Noncontributory.       SOCIAL HISTORY:   Lives with family members.       MEDICATIONS:   Reviewed in Ibex.       ALLERGIES:   REVIEWED IN IBEX.       PHYSICAL EXAMINATION:   VITAL SIGNS:  Blood pressure 123/72, pulse 123, respirations 18, temperature    99.2, O2 sats 100% on room air, pain rated 9 out of 10.   GENERAL APPEARANCE:  The patient appears well developed, well nourished.     Appearance and behavior age and situation appropriate.  She is lying on her    side on stretcher, does appear uncomfortable, but nontoxic.   HEENT:  Head normocephalic, atraumatic.  Eyes:  Conjunctivae are clear.  Lids    are normal.  Pupils are equal, symmetrical, normally reactive.  Mouth and    throat:  Oral mucosa appears dry.  NECK:  Supple is supple, nontender, symmetrical.   LYMPHATICS:  No cervical or submandibular lymphadenopathy palpated.    RESPIRATORY:  Lungs are clear to auscultation bilaterally.  No wheezes, rales,    rhonchi.   CARDIOVASCULAR:  Tachycardic, regular rhythm.  No murmurs, rubs, gallops.  No    peripheral edema.   GASTROINTESTINAL:  Abdomen is soft, nondistended, no tenderness to palpation    to any region.  No organomegaly noted.   MUSCULOSKELETAL:  Negative cva tenderness.  Moving all extremities    appropriately and without difficulty.   SKIN:  Warm and dry without rashes.   NEUROLOGIC:  The patient is alert, oriented, answering questions    appropriately.       CONTINUATION BY JENNIFER HIMMEL-SALCH, DO:       INITIAL ASSESSMENT AND MANAGEMENT PLAN:   The patient  is a 25 year old female well known to [place] from her visit last    evening where she was here for elevated blood sugar due to medical    noncompliance for financial reasons.  The patient has not been able to take    her Lantus because it is too expensive.  The patient has been trying to cover    herself with just her Humalog 70/30 and has been unsuccessful.  The patient     started vomiting yesterday and came in, was evaluated and received intravenous    fluids as well as insulin subcutaneous.  Her sugars came down, her vomiting    resolved.  She was able to tolerate p.o. upon discharge home.  The patient    returns again today because the vomiting started again.  The patient will be    evaluated with i-STAT chem.  Fingerstick glucose is already 478.  The patient    will be getting intravenous fluids and insulin today as an infusion.  The    patient will be reevaluated for improvement, but will likely be staying.       DIAGNOSTIC STUDIES:   The patient's fingerstick glucose on arrival 478 as stated above.  I-STAT    chem:  Sodium 131, potassium 5.3, chloride 105, CO2 total of 10, glucose 452.     Anion gap was 22.  Point of care urine shows 500 glucose, greater than 160    ketones, and trace of intact blood.  The patient received insulin infusion per    protocol and her secondary fingerstick glucose was 349.  The patient's CBC    with differential shows a white blood cell count of 24.6, a hemoglobin of    11.9, a hematocrit of 35.8, platelets are normal.  The patient's urinalysis is    pending.       COURSE IN THE EMERGENCY DEPARTMENT:   The patient was re-evaluated after 2 doses of insulin and was found to    continue to feel nauseated.  The patient continues to complain of midsternal    pain they feel is feel is due to her vomiting.  The patient has not been able    to be given a GI cocktail to help try to alleviate this pain for her due to    her profound nausea and likelihood of vomiting.       CLINICAL IMPRESSION AND DIAGNOSES:   1.  Diabetic ketoacidosis.   2.  Medical noncompliance.   3.  Financial  issues leading to her medical noncompliance.       DISPOSITION AND PLAN:   The patient's care was discussed with  Dr. Valli Glance from [place]    who agreed to admit the patient to a regular inpatient bed with DKA protocol     insulin infusion.  The patient will also be given 20 units of Lantus insulin    subcutaneous prior to disposition to the floor.  The patient dispositioned    upstairs in improving and stable condition.           ___________________   Liberty Handy Himmel-Salch DO   Dictated By: Zachary George. Pernell Dupre, PA-C       My signature above authenticates this document and my orders, the final    diagnosis (es), discharge prescription (s), and instructions in the [facility]    Pulsecheck record.   SD   D:11/26/2010   T: 11/27/2010 16:10:96   [id number]

## 2010-11-26 NOTE — ED Provider Notes (Signed)
MEDICATION ADMINISTRATION SUMMARY              Drug Name: *Morphine Sulfate, Dose Ordered: 2 mg, Route: IV Push,         Status: Given, Time: 00:30 11/27/2010,          Drug Name: Lantus, Dose Ordered: 20 units, Route: Subcutaneous,         Status: Given, Time: 00:29 11/27/2010,          Drug Name: *Sodium Chloride 0.9%, Intravenous, Dose Ordered: 100         mL/hr, Route: IV Fluid, Status: Given, Time: 00:06 11/27/2010,          Drug Name: Phenergan, Dose Ordered: 25 mg , Route: IV Med Infusion,         Status: Given, Time: 23:55 11/26/2010,          Drug Name: Zofran, Dose Ordered: 4 mg , Route: IV Push, Status:         Given, Time: 23:48 11/26/2010,          Drug Name: Novolin R, Dose Ordered: * Per Protocol, Route: IV Med         Infusion, Status: Given, Time: 21:02 11/26/2010,          Drug Name: *Sodium Chloride 0.9%, Intravenous, Dose Ordered: 1000 mL,         Route: IV Fluid, Status: Given, Time: 19:30 11/26/2010, *Additional         information available in notes, Detailed record available in         Medication Service section.       KNOWN ALLERGIES   Penicillins       TRIAGE (17:31 GCG1)   PATIENT: NAME: [name] [name], AGE: 25, GENDER: female, DOB: Wed         11-10-1985, TIME OF GREET: Tue Nov 26, 2010 17:18, SSN: [id number],         KG WEIGHT: 55.3, HEIGHT: 167cm, MEDICAL RECORD NUMBER: [phone],         ACCOUNT NUMBER: [id number], PCP: Melton Krebs,. (17:31         GCG1)   ADMISSION: URGENCY: 3, TRANSPORT: Wheelchair, DEPT: Emergency,         BED: WAITING. (17:31 GCG1)   VITAL SIGNS: BP 123/72, (Sitting), Pulse 123, Resp 18, Temp 99.2,         (Oral), Pain 9, O2 Sat 100, on Room air, Time 11/26/2010 17:28.         (17:28 GCG1)   COMPLAINT:  Blood Sugar High Vomiting. (17:31 GCG1)   LMP: Last menstrual period: depo. (19:06 JLP1)   TB SCREENING: TB screen negative for this patient. (19:06         JLP1)   ABUSE SCREENING: Patient denies physical abuse or threats. (19:06         JLP1)    FALL RISK: Patient has a low risk of falling. (19:06 JLP1)   SUICIDAL IDEATION: Suicidal ideation is not present. (19:06         JLP1)   ADVANCE DIRECTIVES: Patient does not have advance directives.         (19:06 JLP1)   PROVIDERS: TRIAGE NURSE: Guy Franco, RN. (17:31 GCG1)   PREVIOUS VISIT ALLERGIES: Penicillins. (17:31 GCG1)       PRESENTING PROBLEM (Tue Nov 26, 2010 17:31 GCG1)      Presenting problems: Vomiting (minor), Hyperglycemia or Hypoglycemia         (symptomatic).  CURRENT MEDICATIONS (17:31 GCG1)   Humalog:  SSC See Notes. insulin pump - titrate according to         glucose levels.   Lantus:  30 units Subcutaneous once a day (at bedtime).       MEDICATION SERVICE   Lantus:  Order: Lantus (Insulin Glargine) - Dose: 20         units : Subcutaneous         Ordered by: Carolin Guernsey, DO         Entered by: Carolin Guernsey, DO Tue Nov 26, 2010 23:08 ,          Acknowledged by: Berton Lan, LPN Tue Nov 26, 2010 23:17         Documented as given by: Berton Lan, LPN Wed Nov 27, 2010 00:29          Patient, Medication, Dose, Route and Time verified prior to         administration.          Time given: 0027, Amount given: 20 units, Medication administered to         right abdomen, Correct patient, time, route, dose and medication         confirmed prior to administration, Patient advised of actions and         side-effects prior to administration, Allergies confirmed and         medications reviewed prior to administration, Advised not to ambulate         without assistance, Patient in position of comfort, Side rails up,         Cart in lowest position, Call light in reach.   Morphine Sulfate:  Order: Morphine Sulfate - Dose: 2 mg         : IV Push         Notes: Per written admission orders, For Pain, Per order may take per         Med Recon         Ordered by: Carolin Guernsey, DO         Entered by: Berton Lan, LPN Wed Nov 27, 2010 00:30           Documented as given by: Berton Lan, LPN Wed Nov 27, 2010 00:30          Patient, Medication, Dose, Route and Time verified prior to         administration.          Amount given: 2mg , IV SITE #2 into right hand, IVP, Subsequent         different medication, Slowly, Connections checked prior to         administration, Line traced prior to administration, Catheter         placement confirmed via flush prior to administration, IV site         without signs or symptoms of infiltration during medication         administration, No swelling during administration, No drainage during         administration, IV flushed after administration, Correct patient,         time, route, dose and medication confirmed prior to administration,         Patient advised of actions and side-effects prior to administration,         Allergies confirmed and medications reviewed prior to administration,         Patient in position of comfort, Side  rails up, Cart in lowest         position, Call light in reach.   Novolin R:  Order: Novolin R (Insulin Regular Human Recombinant)         - Dose: * Per Protocol : IV Med Infusion         Ordered by: Phil Dopp, PA-C         Entered by: Phil Dopp, PA-C Tue Nov 26, 2010 19:20          Documented as given by: Louanne Skye, RN Tue Nov 26, 2010 21:02          Patient, Medication, Dose, Route and Time verified prior to         administration.          Amount given: 13units/hr, IV SITE #1 IVPB or drip, initial infusion,         IVPB mixed in: , IVPB mixed in: 500cc of 1/2nss with 5o units of         regular insulin.    : Follow Up : _IV SITE #1:_, Titrating to patient response, Dose         decreased, Medication infusion changed to Insulin R 4 units/hr         (10ml/hr). (23:54 EAG1)    : Follow Up : _IV SITE #1:_, Titrating to patient response, Dose         decreased, Medication infusion changed to 1.3 units/hr (72ml/hr)          Maintenance fluid changed to D5 1/2 NS with of K at 129ml/hr.         (Wed Nov 27, 2010 00:42 EAG1)    : Follow Up : _IV SITE #1:_, Medication infusion continued upon         transfer from emergency department, on Wed Nov 27, 2010 01:15, Total         infusion time IV site 1 4 hours, 15 minutes, ., Total amount infused:         300. (Wed Nov 27, 2010 01:17 EAG1)   Phenergan:  Order: Phenergan (Promethazine Hydrochloride) -         Dose: 25 mg : IV Med Infusion         Ordered by: Carolin Guernsey, DO         Entered by: Carolin Guernsey, DO Tue Nov 26, 2010 22:48 ,          Acknowledged by: Berton Lan, LPN Tue Nov 26, 2010 23:17         Documented as given by: Berton Lan, LPN Tue Nov 26, 2010 23:55          Patient, Medication, Dose, Route and Time verified prior to         administration.          Amount given: 25mg , IV SITE #1 into right hand, IV SITE #1 IVPB or         drip, initial infusion, Premixed, IVPB mixed in: 50ml, via secondary         tubing, at 100 ml/hr, Connections checked prior to administration,         Line traced prior to administration, Catheter placement confirmed via         flush prior to administration, IV site without signs or symptoms of         infiltration during medication administration, No swelling during         administration, No  drainage during administration, IV flushed after         administration, Correct patient, time, route, dose and medication         confirmed prior to administration, Patient advised of actions and         side-effects prior to administration, Allergies confirmed and         medications reviewed prior to administration, Patient in position of         comfort, Side rails up, Cart in lowest position, Call light in reach.    : Follow Up : _IV SITE #1:_, Medication infusion discontinued,         on Wed Nov 27, 2010 00:30, 35 minutes, ., Total amount infused: 50ml.         (Wed Nov 27, 2010 00:43 EAG1)    Sodium Chloride 0.9%, Intravenous:  Order: Sodium Chloride 0.9%,         Intravenous (Sodium Chloride) - Dose: 1000 mL : IV Fluid         Notes: Bolus         *Reminder* Enter reason for fluid below         Ordered by: Phil Dopp, PA-C         Entered by: Phil Dopp, PA-C Tue Nov 26, 2010 19:02          Documented as given by: Louanne Skye, RN Tue Nov 26, 2010 19:30          Patient, Medication, Dose, Route and Time verified prior to         administration.          Amount given: , IV SITE #1 IV fluids established, IV SITE #1         1st bag hung, amount 1 Liter.    : Follow Up : _IV SITE #1:_, IV fluid infusion discontinued, on         Tue Nov 26, 2010 21:04, Total fluid hydration time IV site 1 1 hour,         35 minutes, . (21:04 JLP1)   Sodium Chloride 0.9%, Intravenous:  Order: Sodium Chloride 0.9%,         Intravenous (Sodium Chloride) - Dose: 100 mL/hr : IV Fluid         Notes: *Reminder* Enter reason for fluid below         For Hydration         Ordered by: Carolin Guernsey, DO         Entered by: Carolin Guernsey, DO Tue Nov 26, 2010 22:23 ,          Acknowledged by: Berton Lan, LPN Tue Nov 26, 2010 23:17         Documented as given by: Berton Lan, LPN Wed Nov 27, 2010 00:06          Patient, Medication, Dose, Route and Time verified prior to         administration.          Amount given: , IV SITE #1 1st bag hung, amount 1 Liter, IV         SITE #1 bolus of 1000 ml established, IV SITE #1 Rate of bolus,         129ml/hr, ml/hr, via primary tubing, IV SITE #2 into right hand, IV         fluids established, 2nd bag hung, amount 1 Liter, IV bolus of 1000 ml  established, Rate of bolus, 100, ml/hr, Connections checked prior to         administration, Line traced prior to administration, Catheter         placement confirmed via flush prior to administration, IV site         without signs or symptoms of infiltration during medication          administration, No swelling during administration, No drainage during         administration, IV flushed after administration, Correct patient,         time, route, dose and medication confirmed prior to administration,         Patient advised of actions and side-effects prior to administration,         Allergies confirmed and medications reviewed prior to administration,         Patient in position of comfort, Side rails up, Cart in lowest         position, Call light in reach.    : Follow Up : _IV SITE #2:_, IV fluid infusion continued upon         transfer from emergency department, on Wed Nov 27, 2010 01:15, Total         fluid hydration time IV site 2 1 hour, 10 minutes, ., Total amount         infused: 144ml/hr. (Wed Nov 27, 2010 01:20 EAG1)   Zofran:  Order: Zofran (Ondansetron Hydrochloride) -         Dose: 4 mg : IV Push         Ordered by: Carolin Guernsey, DO         Entered by: Carolin Guernsey, DO Tue Nov 26, 2010 22:48 ,          Acknowledged by: Berton Lan, LPN Tue Nov 26, 2010 23:17         Documented as given by: Berton Lan, LPN Tue Nov 26, 2010 23:48          Patient, Medication, Dose, Route and Time verified prior to         administration.          Amount given: 4mg , IV SITE #2 into right hand, IVP, Initial         medication, Slowly, Connections checked prior to administration, Line         traced prior to administration, Catheter placement confirmed via         flush prior to administration, IV site without signs or symptoms of         infiltration during medication administration, No swelling during         administration, No drainage during administration, IV flushed after         administration, Correct patient, time, route, dose and medication         confirmed prior to administration, Patient advised of actions and         side-effects prior to administration, Allergies confirmed and         medications reviewed prior to administration, Patient in position of          comfort, Side rails up, Cart in lowest position, Call light in reach.       ORDERS   Blood Glucose:  Ordered for: Arvella Merles, MD, Rolm Gala         Status: Done by Velora Heckler, Act Iii Tue Nov 26, 2010         20:06. (17:37  GCG1)   12 LEAD EKG:  Ordered for: Arvella Merles, MD, Rolm Gala         Status: Active. (17:40 GCG1)   IV- Saline Lock:  Ordered for: Wenda Overland, DO, Jennifer         Status: Done by Michelle Piper, RN, Janalee Dane Nov 26, 2010 20:42. (19:01         MMA)   I-Stat Chem8+ (complete):  Ordered for: Wenda Overland, DO,         Jennifer         Status: Done by Velora Heckler, Act Iii Tue Nov 26, 2010         20:06. (19:01 MMA)   Urine dip (send for lab U/A if positive):  Ordered for: Wenda Overland, DO, Jennifer         Status: Done by Lindie Spruce, LPN, Luvenia Redden Nov 26, 2010 21:52. (19:01         MMA)   Insulin infusion per Standard Order Set:  Ordered for: Tamala Bari         Status: Done by Michelle Piper RN, Janalee Dane Nov 26, 2010 21:02. (19:21         MMA)   Blood Glucose q 1hr:  Ordered for: Wenda Overland, DO, Victorino Dike         Status: Done by Velora Heckler, Act Iii Tue Nov 26, 2010         20:07. (19:21 MMA)   CBC, AUTOMATED DIFFERENTIAL:  Ordered for: Tamala Bari         Status: Done by System Wed Nov 27, 2010 00:27. (22:26 JHS2)   BASIC METABOLIC PANEL:  Ordered for: Wenda Overland, DO, Jennifer         Status: Active. (22:26 JHS2)   URINALYSIS:  Ordered for: Himmel Salch, DO, Jennifer         Status: Done by System Wed Nov 27, 2010 00:00. (22:26 JHS2)   LAB URINE PREG:  Ordered for: Wenda Overland, DO, Jennifer         Status: Done by System Tue Nov 26, 2010 23:22. (22:26 JHS2)   Page on-call [facility]:  Ordered for: Wenda Overland, DO, Jennifer         Status: Done by Suzette Battiest Nov 26, 2010 22:52. (22:47         JHS2)   IV Pump- Single:  Ordered for: Wenda Overland, DO, Victorino Dike         Status: Active. (22:55 JLP1)    Nexivia IV Cath:  Ordered for: Himmel Salch, DO, Jennifer         Status: Active. (22:55 JLP1)   IV Start kit:  Ordered for: Himmel Salch, DO, Jennifer         Status: Active. (22:55 JLP1)   REGULAR for Dr Metta Clines:  Ordered for: Wenda Overland, DO,         Victorino Dike         Status: Done by Suzette Battiest Nov 26, 2010 23:05. [id number])   Please order Dual I-Med Pump:  Ordered for: Wenda Overland, DO,         Jennifer         Status: Done by Suzette Battiest Nov 26, 2010 23:30. (23:25         EAG1)  Pump Tubing - Smart Site:  Ordered for: Wenda Overland, DO,         Jennifer         Status: Active. (Wed Nov 27, 2010 00:44 EAG1)   IV Pump - Dual:  Ordered for: Himmel Salch, DO, Jennifer         Status: Active. (Wed Nov 27, 2010 00:44 EAG1)   Dial A Flow Tubing:  Ordered for: Wenda Overland, DO, Jennifer         Status: Active. (Wed Nov 27, 2010 00:44 EAG1)   Pump Tubing - Smart Site:  Ordered for: Wenda Overland, DO,         Jennifer         Status: Active. (Wed Nov 27, 2010 00:44 EAG1)   IV Start kit:  Ordered for: Wenda Overland, DO, Jennifer         Status: Active. (Wed Nov 27, 2010 00:44 EAG1)   Elita Boone IV Cath:  Ordered for: Wenda Overland, DO, Jennifer         Status: Active. (Wed Nov 27, 2010 00:44 EAG1)       NURSING ASSESSMENT: ABDOMEN (19:06 JLP1)   CONSTITUTIONAL: Patient cooperative, Patient alert, Oriented to         person, place and time, Skin warm, Skin dry, Skin normal in color.   ABDOMEN: Abdomen assessment findings include abdomen symmetrical,         tender, Pulsatile mass present, Associated with nausea, Associated         with vomiting.       NURSING PROCEDURE: ADMISSION (Wed Nov 27, 2010 00:48 EAG1)   ADMISSION: Patient admitted to a medical-surgical unit, room         number 5230, Report faxed to unit, Fax receipt confirmed, by Okey Dupre,         RN, Admission orders received and completed, Report called at          11/27/2010 00:49, Patient Admited at. 11/27/2010 01:03, Transported         via cart/stretcher, Accompanied by emergency department technician,         Transported with IV fluids, Transported with IV medication(s)         infusion.   SAFETY: Side rails up, Cart/Stretcher in lowest position, Call         light within reach, Hospital ID band on.       NURSING PROCEDURE: BEDSIDE TESTING   PATIENT IDENTIFIER: Patient's identity verified by patient         stating name, Patient's identity verified by patient stating birth         date, Patient's identity verified by hospital ID bracelet, Patient         actively involved in identification process. (17:44 JHL6)     Patient's identity verified by patient stating name, Patient's identity         verified by patient stating birth date, Patient's identity verified         by hospital ID bracelet, Patient actively involved in identification         process. (22:01 MAJ1)     Patient's identity verified by patient stating name, Patient's identity         verified by patient stating birth date, Patient's identity verified         by hospital ID bracelet, Patient actively involved in identification         process. (23:27 MAJ1)     Patient's identity verified by patient  stating name, Patient's identity         verified by hospital ID bracelet. (Wed Nov 27, 2010 00:43 EAG1)   GLUCOSE: Glucose testing indicated for diabetic patient,         Capillary blood sample, Result (mg/dl) 409. (17:44 JHL6)     Glucose testing indicated for diabetic patient, Capillary blood sample,         Result (mg/dl) 811. (22:01 MAJ1)     Capillary blood sample, Result (mg/dl) 914. (23:27 MAJ1)     Glucose testing indicated for diabetic patient, Glucose testing indicated         for hyperglycemia, Capillary blood sample, Result (mg/dl) 782. (Wed         Nov 27, 2010 00:43 EAG1)   FOLLOW-UP: After procedure, results given to Dr. Rolm Bookbinder MD.         (22:01 MAJ1)      After procedure, results given to Dr. Rolm Bookbinder MD. (23:27 MAJ1)   SAFETY: Side rails up, Cart/Stretcher in lowest position,         Hospital ID band on. (22:01 MAJ1)     Side rails up, Cart/Stretcher in lowest position, Hospital ID band on.         (23:27 MAJ1)     Side rails up, Cart/Stretcher in lowest position, Call light within         reach, Hospital ID band on. (Wed Nov 27, 2010 00:43 EAG1)       NURSING PROCEDURE: COMMUNICATIONS (Wed Nov 27, 2010 00:20 EAG1)   COMMUNICATIONS: Critical lab value, received at 0020, received         from Maryruth Hancock, Critical lab result: CO2 11, given to Himmel, MD,         results read back and verified.       NURSING PROCEDURE: EKG CHART (17:42 JHL6)   PATIENT IDENTIFIER: Patient's identity verified by patient         stating name, Patient's identity verified by patient stating birth         date, Patient's identity verified by hospital ID bracelet, Patient         actively involved in identification process.   EKG: EKG indicated for complaint of chest pain, 12 lead EKG         performed on the left chest, done by j.holmes, first EKG.   FOLLOW-UP: After procedure, EKG for interpretation given to Dr.         Arvella Merles, EKG was given to Dr. at 9562, Notes: done @ 1712 in triage per         g.garcia.       NURSING PROCEDURE: IV   PATIENT IDENITIFIER: Patient's identity verified by patient         stating name, Patient's identity verified by patient stating birth         date, Patient's identity verified by hospital ID bracelet, Patient         actively involved in identification process. (20:43 MAJ1)   IV SITE 1: IV established, to the left hand, Saline lock         established, Flushed with normal saline (mls): 10ml, Labs drawn at         time of placement, labeled in the presence of the patient and sent to         lab, Labs drawn at 1910, Tourniquet removed from patient after         procedure., Notes: Specimens labeled in the  presence of patient and          held at bedside. (20:43 MAJ1)   FOLLOW-UP SITE 1: After procedure, sterile transparent dressing         applied. (20:43 MAJ1)   IV SITE 2: IV therapy indicated for hydration, IV therapy         indicated for medication administration, IV established, to the right         hand, using a 20 gauge catheter, in one attempt, Saline lock         established, Flushed with normal saline (mls): 10ml, Labs drawn at         time of placement, labeled in the presence of the patient and sent to         lab, Labs drawn at 2340, Tourniquet removed from patient after         procedure., Labs labeled in the presence of the patient and then sent         to lab. (23:55 EAG1)   FOLLOW-UP SITE 2: After procedure, sterile transparent dressing         applied, After procedure, no drainage at IV site, After procedure, no         swelling at IV site, After procedure, no redness at IV site. (23:55         EAG1)   SAFETY: Side rails up, Cart/Stretcher in lowest position, Call         light within reach, Hospital ID band on. (20:43 MAJ1)     Side rails up, Cart/Stretcher in lowest position, Call light within         reach, Hospital ID band on. (23:55 EAG1)       NURSING PROCEDURE: NURSE NOTES (22:15 JLP1)   NURSES NOTES: Notes: changed insulin drip to 6 units per hour due         to blood sugar 349.       DIAGNOSIS (23:02 JHS2)   FINAL: PRIMARY: Diabetic Ketoacidosis (DKA).       DISPOSITION   PATIENT:  Disposition Type: Inpatient, Disposition: Regular Bed         Admission, Condition: Stable. (23:02 JHS2)      Patient left the department. (Wed Nov 27, 2010 01:24 EAG1)   Key:     EAG1=Gibson, LPN, Durene Romans, RN, Glenda  JHL6=Holmes, ACT     III, Marylu Lund     JHS2=Himmel [place], DO, Victorino Dike  JLP1=Guy, RN, Victorino Dike      MAJ1=Allen-Jackson, Merrily, Act Iii     MMA=Adams, PA-C, [employer]

## 2010-11-27 NOTE — Progress Notes (Signed)
(  S)no co. no vomiting. alert, calm. states that walked   (O) VSS in EMR, BP is better. above clinical data noted   Lungs: CTA   Heart: reg   Abd: NT. positive BSs   Extr: +1 edema in arms and none in legs   Alert, calm   SBFT: slow transit   A/P:   1. Diabetic gastroparesis with abd pain, nausea and vomiting. SBFT is OK,   advanced diet PO, tolerating well.   2. Uncontrolled DM - 2. A1c is 6.9. increased lantus to 16 units. home dose   is 20.   3. MRSA UTI - change from IV vanco to PO doxy. finished 7 days today   4. Thrombocytopenia - has had this before. ? etiology. SRA is negative. SPEP   and UPEP are negative. hem/onc: no further suggestions.   5. Anemia of chronic disease - likely related to CKD. nephrology on the case.   no role for epo   6. HTN - resumed home dose amlodipine - now better   7. Hypothyroidism - resumed home dose levothyroxine PO   8. Chronic obstructive pulmonary disease.   9. History of cerebrovascular accident. - start ASA 81 mg   10. AKI on CKD - per nephrology. Cr is stable   11. pleural effusions - monitor. no thoracentesis indicated now. CXR in am   12. UE edema dispoportionate to LE edema - PVL is negative for DVT   13. DVT prevention - SRA came back negative, started SC heparin.   see my orders. likely DC tomorrow   Comment entered by Valli Glance MD on 11/27/2010 at 15:57

## 2010-11-27 NOTE — H&P (Signed)
[facility]   History and Physical   NAME:  [name] [name], [name] [name]   SEX:   F   ADMIT: 11/26/2010   DOB:[birth year]   MR#    [id number]   ROOM:     ACCT#  [id number]       I hereby certify this patient for admission based upon medical necessity as    noted below:       &lt;       ADMISSION DATE:   11/26/2010       DATE OF SERVICE:   11/27/2010       CHIEF COMPLAINT:   "I ran out of Lantus"       PRESENTATION HISTORY:   The patient is a 25 year old female with type 1 diabetes mellitus who has not    taken Lantus for over a week.  She tells me that her Medicaid lapsed and she    could not afford Lantus and has not been taking this medication.  As a    result, she got sick and started to have nausea and vomiting, classical DKA    and came to the emergency room.  She was given insulin in the emergency room    and sent home.  She came back because she could not thrive and was admitted.     She has anion gap and ketones in the urine.  The patient denies any cough,    denies any dysuria, any chills or fevers.  Has not been on 70/30 insulin    before.  The patient tells me that her primary care physician used to be Wynell Balloon but now does not have a primary care doctor anymore.       PAST MEDICAL HISTORY:   Type 1 diabetes.       PAST SURGICAL HISTORY:   C-section.       ALLERGIES:   PENICILLIN GIVES HER HIVES.       FAMILY HISTORY:   Mother healthy.  Father unknown.  One brother okay.  One child lives and is    being raised by the patient's mother.       SOCIAL HISTORY:   No alcohol, drug use or tobacco use.  Works as a Conservation officer, nature at [employer].       REVIEW OF SYSTEMS:   Has upper chest pain worse with touching the area without any trauma to that    area.  All other systems reviewed and are negative.       HOME MEDICATIONS:   Currently none as she used to be on Lantus and ran out.  She also used to take    sliding scale as needed.  For example, she tells me that if her blood sugar     was 100 before the meal, she would not take any insulin before the meal, but    rather take Lantus at nighttime.  She tells me her Lantus dose at night was    30.       PHYSICAL EXAMINATION:   GENERAL:  This is a 25 year old female with no acute distress.   VITAL SIGNS:  Blood pressure 143/75 with a pulse of 71, respirations 20,    temperature is 98.   HEENT:  Head is normocephalic, atraumatic.  Extraocular muscle movements    within normal limits.  Tongue is midline and moist.   NECK:  Reveals no JVD.   HEART:  S1 and S2 regular.  There is no  murmur.   LUNGS:  Clear to auscultation bilaterally.   ABDOMEN:  Soft, nontender, positive normoactive bowel sounds.     CHEST WALL:  Chest wall and upper chest area is somewhat uncomfortable when    touched.  No crepitation.  No bruises or cuts in this area.  No cellulitic    changes.   ABDOMEN:  Soft, nontender, positive normoactive bowel sounds.   EXTREMITIES:  Show no edema.  A few cuts which have healed on her lower    extremities.   SKIN:  No bruises or rashes.   NEUROLOGIC:  She is alert and oriented times 3, answers all my questions very    appropriately.  She appears to have normal mood and affect.       LABORATORY STUDIES:   Sodium 135, potassium 4.2, chloride 111, bicarbonate 11.  This has come up to    16 today.  Anion gap is now closed.  Blood sugar yesterday was 452.  TSH,    free T4 normal.  Hemoglobin A1c 9.8.  On 09/16/2010 it was 10.1.  On    03/05/2008 it was 7.6.  WBC count done yesterday shows white count of 24,600,    hemoglobin 11.9, platelet count of 310.  Urinalysis is negative for    infection.  Pregnancy test negative.       ASSESSMENT AND PLAN:   1.  Mild diabetic ketoacidosis.  The patient has closed anion gap.  I think we    can switch her to subcutaneous insulins.  Unfortunately, she cannot afford    Lantus.  Our hospital does not provide it.  Assistance programs do not provide     it.  I think we have to use 70/30 insulin as the only option for the moment.     Moreover, the patient has demonstrated poor compliance even when she had her    insulin with elevated hemoglobin A1c.  I think twice a day regimen of her    insulins would be optimal.  She will be placed on 24 units of 70/30 insulin in    the morning and 12 units at night.  I would like to monitor her on this    regimen for a day and if she is stable, possibly discharge home.   2.  Leukocytosis, etiology is unknown, may be reactive to diabetic    ketoacidosis.  Will follow up in the morning.  Monitor.  If spikes    temperature, will do blood cultures.   3.  The patient is a FULL CODE.   4.  The patient has no primary care physician           ___________________   Valli Glance MD   Dictated By: .    SP   D:11/27/2010   T: 11/27/2010 12:32:44   [id number]

## 2010-11-28 NOTE — Progress Notes (Signed)
(  S) no co. cont to have vomiting. no blood.   (O) VSS in EMR, above clinical data noted. afebrile   Lungs: CTA   Heart: reg   Abd: soft with positive BSs   A/P:   1. DKA - resolved. cont 70/30 insulin   2. nausea/vomiting - states that has gastroparesis - known diagnosis. will   start on low dose reglan. asked about morphine - explained to her that this   will worsen her condition.   3. Hypokalemia - replace, F/u   4. Leukocytosis - resolved   see orders   Comment entered by Valli Glance MD on 11/28/2010 at 12:36

## 2010-11-29 NOTE — Discharge Summary (Signed)
[facility]   Discharge Summary    NAME:  [name], [name]   SEX:   F   ADMIT: 11/26/2010   DISCH: 11/29/2010   DOB:   1985/08/10   MR#    [id number]   ACCT#  [id number]       cc: [facility]        DATE OF ADMISSION:    11/26/2010       DATE OF DISCHARGE:     11/29/2010       DISCHARGE DIAGNOSES:   1.  Diabetic ketoacidosis due to noncompliance, inability to afford    medications per patient.   2.  Nausea and vomiting due to the patient's reported history of    gastroparesis, started on Reglan, improved.   3.  Hypokalemia, replaced, resolved.   4.  Reactive leukocytosis, resolved without any treatment.       PRESENTATION HISTORY AND HOSPITAL COURSE:   Please refer to my admission note for further details regarding the patient's    presentation.       Briefly, the patient is a 25 year old female with type 1 diabetes mellitus who    has not been taking her insulin as for over 1 week.  Apparently her insurance    lapsed and she was not able to afford medications and then she came to the    hospital with nausea and vomiting.  She stated that she had gastroparesis.     The patient was started on 70/30 insulin.  I explained to her that this is not    the best regimen but appears to be the most accessible to her given her    financial situation.  She agrees to take it.  Also, we will try to give her    these insulins upon discharge.  In the past, she has used 30 units of Lantus.     At this time, I switched her regimen to 24 units of insulin 70/30 in the    morning and 12 units before dinner.  The patient is eating much better now, so    I do not expect more hypoglycemia.  In terms of gastroparesis, this has    resolved as well on Reglan.  I explained to her side effects of this    medication and told her that if she experiences any twitching and jerking    movements of her limbs or other parts of the body she should quit taking this    medication immediately.  She voiced understanding of my discharge     instructions in this respect.       DISCHARGE MEDICATIONS:   1.  Insulin 70/30 24 units before breakfast, 12 units before dinner.     2.  Metoclopramide 5 mg p.o. 3 times per day before meals.   3.  Omeprazole 20 mg p.o. daily before breakfast, which is over the counter.       FOLLOW-UP INSTRUCTIONS:   The patient will be discharged home.  She will need to follow up with primary    care physician in 1 week.  The patient currently does not have a primary care    physician and will need to go to the [facility].           ___________________   Valli Glance MD   Dictated By: .    LH   D:11/29/2010   T: 11/29/2010 15:36:05   [id number]

## 2010-11-29 NOTE — Progress Notes (Signed)
[facility]   Transitional Care Clinic Progress Note   NAME:  [name] [name], [name] [name]   DATE OF BIRTH:[birth year]   DATE: 12/09/2010   SEX: F   REFERRING PHYSICIAN: Melton Krebs   MR# [id number]   LOCATION: [facility]   BILLING#  [id number]               The patient canceled appointment.  The patient will be called back to    reschedule, a message was left on machine, awaiting for patient to call back.           ___________________   Windell Moulding NP   Dictated By:.    DF   D:12/09/2010   T: 12/10/2010 07:40:16   [id number]

## 2010-12-24 NOTE — H&P (Signed)
[facility]   CONSULTATION REPORT   NAME:  [place], [place]   SEX:   F   ADMIT: 12/24/2010   DATE OF CONSULT: 12/24/2010   REFERRING PHYSICIAN: SUSAN S KIM-FOLEY   DOB: 16-Apr-1985   MR#    [id number]   ROOM:     ACCT#  [id number]               CHIEF COMPLAINT:   Follow up on diabetes.       HISTORY OF PRESENT ILLNESS:   The patient was hospitalized from 10/30 through 11/29/10, diagnosed with    nausea, vomiting and DKA.  The patient had run out of her Lantus for a week.     She was, however, still currently using Humalog.  Her Medicaid had lapsed and    she was unable to afford the Lantus.  Her hemoglobin A1c on 09/16/10 was    10.1, during hospitalization was 9.8.  The patient presents here today stating    that she was discharged on Humulin 70/30; however, she was unable to afford    it since it was $171 at [facility].  I explained to patient that the same    medication could have been purchased for $28 at [facility].       CURRENT MEDICATIONS:   Humalog.  The patient uses a correction scale and typically uses approximately    5 to 6 units per meal.       ALLERGIES:   PENICILLIN WHICH CAUSES HIVES.       PAST MEDICAL HISTORY:   Diabetes type 1, uncontrolled.  Gastroparesis.       PAST SURGICAL HISTORY:   C-section.       SOCIAL HISTORY:   Marital status is single.  Occupation is Conservation officer, nature.  Education is 1 year    college.  Currently lives with her mother and daughter.  Tobacco:  Denies use.     Alcohol:  Denies use.  Illegal drugs:  Denies use.       FAMILY HISTORY:   Maternal grandmother with diabetes.  Father with unknown health history.     Mother is alive and well.  Brother is alive and well.  Child alive and well.       REVIEW OF SYSTEMS:   GENERAL:  The patient states that she has had a significant weight loss over    the past month.  She said she lost approximately 37 pounds prior to going to    the hospital, but has gained approximately 12 to 13 pounds back.    SKIN:  Denies any rashes, itching, dryness, lumps, lesions or wounds.   HEAD:  Denies any current headache.   EYES:  Does report of visual changes, blurred vision.  States she does wear    glasses which helps but her blood sugar is still elevated so she states that    her vision is still blurred.   EARS:  Denies any hearing loss or ear pain.   NOSE:  Denies any stuffiness, itching or nosebleeds.   THROAT AND MOUTH:  Denies any tooth pain, any sore throat.   NECK:  Denies any lumps, goiters, pain.   BREASTS:  Denies any lumps, pain or nipple discharge.   RESPIRATORY:  Denies any cough, hemoptysis, wheezing, dyspnea or pain.   CARDIOVASCULAR:  Denies any chest pain, orthopnea, swelling, irregular pulse.   GASTROINTESTINAL:  Denies any nausea, vomiting, diarrhea, abdominal pain, any    change in bowel habits  or blood in stool.   URINARY:  Denies any dysuria, hematuria, incontinence or frequency.   GENITAL:  Denies any vaginal discharge, burning or pain.   VASCULAR:  Denies any leg cramping, swelling or pain.  Does report some    numbness and tingling in her feet over the last 3 to 4 weeks.   MUSCULOSKELETAL:  Denies any muscle joint or back pain.   PSYCHIATRIC:  Denies any depression, anxiety or hallucinations.   NEUROLOGIC:  Denies any dizziness, numbness, tingling, seizures.   HEMATOLOGICAL:  Denies easy bleeding or bruising.   ENDOCRINE:  The patient is a known diabetic diagnosed at age 25.  Fasting    blood sugars 200 to 300 not checking postprandial at this time.  She states    that she is taking her Humalog sliding scale correction factor of 1 unit every    30 mg per deciliter over 120 mg per deciliter.  She is also doing a count and    using 1 unit for every 10 grams of carbohydrates.  The patient did not fill    her Humulin 70/30 due to the cost of $171 at [facility].  However, is still    taking Humalog.       PHYSICAL EXAMINATION:   GENERAL APPEARANCE:  Thin black female in no acute distress.    VITAL SIGNS:  Temperature 98.3, heart rate 92, respirations 16, blood pressure    100/64, O2 saturation 99%, weight 112 pounds, height 66 inches.   SKIN:  No lesions.  Skin is pink, warm and dry.   HEENT:  Head:  Hair is of average texture, no hair loss.  Eyes:  Pupils equal,    round, reactive to light and accommodation.  Sclerae is white.  Conjunctivae    pink.  Extraocular movements are intact.  Pupils are 3 mm constricting to 2    mm bilaterally.  Ears:  Tragus is nontender.  No discharge, redness or    swelling.  Tympanic membranes intact.  Cone of light is easily identified.     Nose:  Mucosa pink, no exudate, bleeding or swelling.  No sinus tenderness.     Throat and mouth:  Lips are pink.  Oral mucosa pink and moist.  No ulcers,    white patches or nodules.  Pharynx pink and moist without exudate.   NECK:  Supple, Trachea midline.  Thyroid nontender and not enlarged.   LYMPH NODES:  No occipital, posterior auricular, preauricular, cervical or    supraclavicular lymph nodes palpable.   RESPIRATORY:  Thorax is symmetric.  No retractions or accessory muscle use.     Lungs are clear to auscultation.   CARDIOVASCULAR:  No JVD.  Carotid upstrokes brisk without thrills or bruits,    crisp S1 and S2, no murmurs, rubs or gallops.   ABDOMEN:  Soft, round, nondistended, nontender with normoactive bowel sounds.   URINARY:  No cva tenderness.   EXTREMITIES:  Warm and without edema.   PERIPHERAL VASCULAR:  No peripheral edema, no stasis pigmentation or ulcers.     Pulses are 2+ bilaterally, dorsalis pedis and posterior tibial.   MUSCULOSKELETAL:  No joint deformities, no redness, tenderness, swelling of    the joints.   NEUROLOGICAL:  Mental status:  Alert, relaxed and cooperative.  Thought    process is coherent, oriented to person, place and time.  Cranial nerves    III-XII are intact.  Motor:  Good muscle bulk and tone.  Deep tendon reflexes  of the biceps and patellar are 1+ and equal.       ASSESSMENT AND PLAN:    Diabetes type 1, uncontrolled.  Prescription was given for Humulin N 100 units    per mL.  The patient is to inject 14 units subcutaneous 30 minutes before    morning and evening meal, dispense #1 vial through the indigent medication    program.  Humulin R 100 units per mL correction factor of 1 unit for every 30    mg per deciliter over 120 mg per deciliter 3 times a day prior to meals.  The    patient has also used to inject 1 unit for every 10 grams of carbohydrates    prior to meals dispense #1 vial.  I did discuss that both the Humulin N and    Humulin R need to be injected 20 to 30 minutes before her meals and The    patient did verbalize understanding.  The patient is to follow a diabetic    diet.  Follow up here in 2 weeks with a record of her blood sugars.  Did    discuss daily food inspections, drying well between her toes, use lotion daily    after her shower.  Discussed yearly ophthalmological exams.  The patient will    refer once at [facility] as she is currently unable to pay    to go seek the specialist.  Paperwork was completed for the [facility] and the patient will follow up here in 2 weeks to discuss her    blood sugars further.           ___________________   Windell Moulding NP   Dictated By:.    [facility]   D:12/24/2010   T: 12/24/2010 13:06:58   [id number]

## 2011-03-18 NOTE — ED Provider Notes (Signed)
MEDICATION ADMINISTRATION SUMMARY              Drug Name: Zofran, Dose Ordered: 4 mg, Route: IV Push, Status: Given,         Time: 13:15 03/18/2011,          Drug Name: *Sodium Chloride 0.9%, Intravenous, Dose Ordered: 1000 mL,         Route: IV Fluid, Status: Given, Time: 12:30 03/18/2011,          Drug Name: *Potassium Chloride, Dose Ordered: 20 mEq, Route: IV Med         Infusion, Status: Given, Time: 11:33 03/18/2011,          Drug Name: Morphine Sulfate, Dose Ordered: 4 mg, Route: IV Push,         Status: Given, Time: 11:25 03/18/2011,          Drug Name: Reglan, Dose Ordered: 10 mg, Route: IV Push, Status:         Given, Time: 10:07 03/18/2011,          Drug Name: Ativan, Dose Ordered: 1 mg, Route: IV Push, Status: Given,         Time: 10:05 03/18/2011,          Drug Name: *Sodium Chloride 0.9%, Intravenous, Dose Ordered: 1000 mL,         Route: IV Fluid, Status: Given, Time: 09:36 03/18/2011,          Drug Name: Zofran, Dose Ordered: 4 mg, Route: IV Push, Status: Given,         Time: 09:35 03/18/2011,          Drug Name: Morphine Sulfate, Dose Ordered: 4 mg, Route: IV Push,         Status: Given, Time: 09:35 03/18/2011, *Additional information         available in notes, Detailed record available in Medication Service         section.       KNOWN ALLERGIES   Penicillins       TRIAGE (08:57 MML1)   PATIENT: NAME: [name] [name], AGE: 26, GENDER: female, DOB: Wed         [birth year], TIME OF GREET: Tue Mar 18, 2011 08:49, SSN: [id number],         KG WEIGHT: 54.9, HEIGHT: 167cm, MEDICAL RECORD NUMBER: [id number],         ACCOUNT NUMBER: [id number], PCP: Melton Krebs,. (08:57         MML1)   ADMISSION: URGENCY: 3, TRANSPORT: Ambulatory, DEPT: Emergency,         BED: 2ED 44. (08:57 MML1)   VITAL SIGNS: BP 108/96, Pulse 111, Resp 18, Temp 98.2, (Oral),         Pain 9, O2 Sat 100, on Room air, Time 03/18/2011 08:55. (08:55         MML1)   COMPLAINT:  N/V/Stomach Pain. (08:57 MML1)    PRESENTING COMPLAINT:  n/v ab pain started last pm. (09:22         JYG)   PAIN: Patient complains of pain, On a scale 0-10 patient rates         pain as 10, Pain is constant. (09:22 JYG)   LMP: Pt on birth control, Other: depo. (09:22 JYG)   TREATMENT PRIOR TO ARRIVAL: None. (09:22 JYG)   TB SCREENING: TB screen negative for this patient. (09:22         JYG)   ABUSE SCREENING: Patient denies physical abuse  or threats. (09:22         JYG)   FALL RISK: Patient has a low risk of falling. (09:22 JYG)   SUICIDAL IDEATION: Suicidal ideation is not present. (09:22         JYG)   ADVANCE DIRECTIVES: Patient does not have advance directives.         (09:22 JYG)   PROVIDERS: TRIAGE NURSE: Lanny Hurst, RN. (08:57 MML1)   PREVIOUS VISIT ALLERGIES: Penicillins. (08:57 MML1)       PRESENTING PROBLEM (08:57 MML1)      Presenting problems: Nausea, Vomiting, Abdominal Pain.       CURRENT MEDICATIONS (09:00 MML1)   Lantus:  30 units Subcutaneous once a day (at bedtime).   Humalog:  SSC See Notes. insulin pump - titrate according to         glucose levels.       MEDICATION SERVICE   Ativan:  Order: Ativan (Lorazepam) - Dose: 1 mg : IV         Push         Ordered by: Vinnie Langton, MD         Entered by: Vinnie Langton, MD Tue Mar 18, 2011 09:48 ,          Acknowledged by: Evie Lacks, RN Tue Mar 18, 2011 09:50         Documented as given by: Evie Lacks, RN Tue Mar 18, 2011 10:05          Patient, Medication, Dose, Route and Time verified prior to         administration.          Amount given: 1mg , IV SITE #1 into right antecubital, IV SITE #1         IVP, subsequent different medication, Slowly, Connections checked         prior to administration, Line traced prior to administration,         Catheter placement confirmed via flush prior to administration, IV         site without signs or symptoms of infiltration during medication         administration, No swelling during administration, No drainage during          administration, IV flushed after administration, Correct patient,         time, route, dose and medication confirmed prior to administration,         Patient advised of actions and side-effects prior to administration,         Allergies confirmed and medications reviewed prior to administration,         Patient in position of comfort, Side rails up, Cart in lowest         position, Call light in reach.    : Follow Up : Response assessment performed, No signs or         symptoms of allergic reaction noted, No change in pain, Decreased         symptoms, _IV SITE #1:_, pt less restless, Advised not to ambulate         without assistance, Patient in position of comfort, Side rails up,         Cart in lowest position, Call light in reach. (10:25 JYG)   Morphine Sulfate:  Order: Morphine Sulfate - Dose: 4 mg         : IV Push         Ordered by: Lenox Ponds, PA-C  Entered by: Lenox Ponds, PA-C Tue Mar 18, 2011 09:23 ,          Acknowledged by: Evie Lacks, RN Tue Mar 18, 2011 09:23         Documented as given by: Evie Lacks, RN Tue Mar 18, 2011 09:35          Patient, Medication, Dose, Route and Time verified prior to         administration.          Amount given: 4mg , IV SITE #1 into right antecubital, IV SITE #1         IVP, initial medication, Slowly, Connections checked prior to         administration, Line traced prior to administration, Catheter         placement confirmed via flush prior to administration, IV site         without signs or symptoms of infiltration during medication         administration, No swelling during administration, No drainage during         administration, IV flushed after administration, Correct patient,         time, route, dose and medication confirmed prior to administration,         Patient advised of actions and side-effects prior to administration,         Allergies confirmed and medications reviewed prior to administration,          Patient in position of comfort, Side rails up, Cart in lowest         position, Call light in reach.   Morphine Sulfate: Response assessment performed, No signs or         symptoms of allergic reaction noted, Decreased pain, _IV SITE #1:_,         Advised not to ambulate without assistance, Patient in position of         comfort, Side rails up, Cart in lowest position, Call light in reach,         Pain: 8. (10:05 JYG)   Morphine Sulfate:  Order: Morphine Sulfate - Dose: 4 mg         : IV Push         Ordered by: Vinnie Langton, MD         Entered by: Vinnie Langton, MD Tue Mar 18, 2011 11:04 ,          Acknowledged by: Evie Lacks, RN Tue Mar 18, 2011 11:10         Documented as given by: Evie Lacks, RN Tue Mar 18, 2011 11:25          Patient, Medication, Dose, Route and Time verified prior to         administration.          Amount given: 4mg , IV SITE #1 into right antecubital, IV SITE #1         IVP, repeat same medication, Slowly, Connections checked prior to         administration, Line traced prior to administration, Catheter         placement confirmed via flush prior to administration, IV site         without signs or symptoms of infiltration during medication         administration, No swelling during administration, No drainage during         administration, IV flushed after administration, Correct patient,  time, route, dose and medication confirmed prior to administration,         Patient advised of actions and side-effects prior to administration,         Allergies confirmed and medications reviewed prior to administration,         Patient in position of comfort, Side rails up, Cart in lowest         position, Call light in reach.   Morphine Sulfate: Response assessment performed, No signs or         symptoms of allergic reaction noted, Decreased pain, _IV SITE #1:_,         Pain: 4. (12:05 JYG)   Potassium Chloride:  Order: Potassium Chloride - Dose:         20 mEq : IV Med Infusion          Notes: Infuse over 2 hours         Verbal Order, Read back and verified         Ordered by: Vinnie Langton, MD         Entered by: Evie Lacks, RN Tue Mar 18, 2011 11:10 ,          Acknowledged by: Evie Lacks, RN Tue Mar 18, 2011 11:10         Documented as given by: Evie Lacks, RN Tue Mar 18, 2011 11:33          Patient, Medication, Dose, Route and Time verified prior to         administration.          Amount given: , IV SITE #1 into right antecubital, IV SITE #1         IVPB or drip, initial infusion, Premixed, IVPB mixed in: , via         pump tubing, on an IV pump, at 100 ml/hr, Connections checked prior         to administration, Line traced prior to administration, Catheter         placement confirmed via flush prior to administration, IV site         without signs or symptoms of infiltration during medication         administration, No swelling during administration, No drainage during         administration, IV flushed after administration, Correct patient,         time, route, dose and medication confirmed prior to administration,         Patient advised of actions and side-effects prior to administration,         Allergies confirmed and medications reviewed prior to administration,         Patient in position of comfort, Side rails up, Cart in lowest         position, Call light in reach.    : Follow Up : _IV SITE #1:_, 2nd run started at same rate,         Advised not to ambulate without assistance, Patient in position of         comfort, Side rails up, Cart in lowest position, Call light in reach.         (13:18 JYG)    : Follow Up : Response assessment performed, No signs or         symptoms of allergic reaction noted, _IV SITE #1:_, Medication         infusion discontinued, on Tue Mar 18, 2011 14:00, Total infusion time  IV site 1 2 hours, 30 minutes, ., Total amount infused: , Pt         being d/c'd; IV fluid stopped at pt request, Advised not to  ambulate         without assistance, Patient in position of comfort, Side rails up,         Cart in lowest position, Call light in reach. (14:00 JYG)   Reglan:  Order: Reglan (Metoclopramide Hydrochloride) -         Dose: 10 mg : IV Push         Ordered by: Vinnie Langton, MD         Entered by: Vinnie Langton, MD Tue Mar 18, 2011 09:48 ,          Acknowledged by: Evie Lacks, RN Tue Mar 18, 2011 09:50         Documented as given by: Evie Lacks, RN Tue Mar 18, 2011 10:07          Patient, Medication, Dose, Route and Time verified prior to         administration.          Amount given: 10mg , IV SITE #1 into right antecubital, IV SITE #1         IVP, subsequent different medication, Slowly, Connections checked         prior to administration, Line traced prior to administration,         Catheter placement confirmed via flush prior to administration, IV         site without signs or symptoms of infiltration during medication         administration, No swelling during administration, No drainage during         administration, IV flushed after administration, Correct patient,         time, route, dose and medication confirmed prior to administration,         Patient advised of actions and side-effects prior to administration,         Allergies confirmed and medications reviewed prior to administration,         Patient in position of comfort, Side rails up, Cart in lowest         position, Call light in reach.    : Follow Up : Response assessment performed, No signs or         symptoms of allergic reaction noted, Decreased nausea, _IV SITE #1:_,         Advised not to ambulate without assistance, Patient in position of         comfort, Side rails up, Cart in lowest position, Call light in reach.         (10:26 JYG)   Sodium Chloride 0.9%, Intravenous:  Order: Sodium Chloride 0.9%,         Intravenous (Sodium Chloride) - Dose: 1000 mL : IV Fluid         Notes: Bolus         *Reminder* Enter reason for fluid below          For Hydration         Ordered by: Lenox Ponds, PA-C         Entered by: Lenox Ponds, PA-C Tue Mar 18, 2011 09:22 ,          Acknowledged by: Evie Lacks, RN Tue Mar 18, 2011 09:23         Documented as given by: Evie Lacks, RN Tue  Mar 18, 2011 09:36          Patient, Medication, Dose, Route and Time verified prior to         administration.          Amount given: , IV SITE #1 into right antecubital, IV SITE #1         IV fluids established, IV SITE #1 1st bag hung, amount 1 Liter, IV         SITE #1 bolus of 1000 ml established, IV SITE #1 Rate of bolus, wide         open, via primary tubing, Connections checked prior to         administration, Line traced prior to administration, Catheter         placement confirmed via flush prior to administration, IV site         without signs or symptoms of infiltration during medication         administration, No swelling during administration, No drainage during         administration, IV flushed after administration, Correct patient,         time, route, dose and medication confirmed prior to administration,         Patient advised of actions and side-effects prior to administration,         Allergies confirmed and medications reviewed prior to administration,         Patient in position of comfort, Side rails up, Cart in lowest         position, Call light in reach.    : Follow Up : _IV SITE #1:_, IV fluid infusion discontinued, on         Tue Mar 18, 2011 12:00, Total fluid hydration time IV site 1 2 hours,         25 minutes, ., Total amount infused: 1000 ml. (12:23 BNP)   Sodium Chloride 0.9%, Intravenous:  Order: Sodium Chloride 0.9%,         Intravenous (Sodium Chloride) - Dose: 1000 mL : IV Fluid         Notes: Bolus         Verbal Order, Read back and verified, For Hydration         Ordered by: Vinnie Langton, MD         Entered by: Evie Lacks, RN Tue Mar 18, 2011 11:10 ,           Acknowledged by: Evie Lacks, RN Tue Mar 18, 2011 11:10         Documented as given by: Glenetta Borg, RN Tue Mar 18, 2011 12:30          Patient, Medication, Dose, Route and Time verified prior to         administration.          Amount given: 1000 ml, IV SITE #1 into right antecubital, IV SITE #1         IV fluids established, IV SITE #1 2nd bag hung, amount 1 Liter, IV         SITE #1 Repeat bolus of 1000 ml established, via primary tubing,         Connections checked prior to administration, Line traced prior to         administration, Catheter placement confirmed via flush prior to         administration, IV site without signs or symptoms of infiltration  during medication administration, No swelling during administration,         No drainage during administration, IV flushed after administration,         Correct patient, time, route, dose and medication confirmed prior to         administration, Patient advised of actions and side-effects prior to         administration, Allergies confirmed and medications reviewed prior to         administration.    : Follow Up : Response assessment performed, No signs or         symptoms of allergic reaction noted, _IV SITE #1:_, IV fluid infusion         discontinued, on Tue Mar 18, 2011 14:00, Total fluid hydration time         IV site 1 1 hour, 30 minutes, ., Total amount infused: , Advised         not to ambulate without assistance, Patient in position of comfort,         Side rails up, Cart in lowest position, Call light in reach. (14:00         JYG)   Zofran:  Order: Zofran (Ondansetron Hydrochloride) -         Dose: 4 mg : IV Push         Ordered by: Lenox Ponds, PA-C         Entered by: Lenox Ponds, PA-C Tue Mar 18, 2011 09:23 ,          Acknowledged by: Evie Lacks, RN Tue Mar 18, 2011 09:23         Documented as given by: Evie Lacks, RN Tue Mar 18, 2011 09:35           Patient, Medication, Dose, Route and Time verified prior to         administration.          Amount given: 4mg , IV SITE #1 into right antecubital, IV SITE #1         IVP, initial medication, Slowly, Connections checked prior to         administration, Line traced prior to administration, Catheter         placement confirmed via flush prior to administration, IV site         without signs or symptoms of infiltration during medication         administration, No swelling during administration, No drainage during         administration, IV flushed after administration, Correct patient,         time, route, dose and medication confirmed prior to administration,         Patient advised of actions and side-effects prior to administration,         Allergies confirmed and medications reviewed prior to administration,         Patient in position of comfort, Side rails up, Cart in lowest         position, Call light in reach.    : Follow Up : Response assessment performed, No signs or         symptoms of allergic reaction noted, Decreased vomiting, _IV SITE         #1:_, still nauseated, Advised not to ambulate without assistance,         Patient in position of comfort, Side rails up, Cart in lowest         position, Call light in  reach. (10:05 JYG)   Zofran:  Order: Zofran (Ondansetron Hydrochloride) -         Dose: 4 mg : IV Push         Ordered by: Lenox Ponds, PA-C         Entered by: Lenox Ponds, PA-C Tue Mar 18, 2011 12:44 ,          Acknowledged by: Evie Lacks, RN Tue Mar 18, 2011 12:49         Documented as given by: Evie Lacks, RN Tue Mar 18, 2011 13:15          Patient, Medication, Dose, Route and Time verified prior to         administration.          Amount given: 4mg , IV SITE #1 into right antecubital, IV SITE #1         IVP, repeat same medication, Slowly, Connections checked prior to         administration, Line traced prior to administration, Catheter          placement confirmed via flush prior to administration, IV site         without signs or symptoms of infiltration during medication         administration, No swelling during administration, No drainage during         administration, IV flushed after administration, Correct patient,         time, route, dose and medication confirmed prior to administration,         Patient advised of actions and side-effects prior to administration,         Allergies confirmed and medications reviewed prior to administration,         Patient in position of comfort, Side rails up, Cart in lowest         position, Call light in reach.    : Follow Up : Response assessment performed, No signs or         symptoms of allergic reaction noted, Decreased nausea, _IV SITE #1:_,         Advised not to ambulate without assistance, Patient in position of         comfort, Side rails up, Cart in lowest position, Call light in reach.         (13:45 JYG)       ORDERS   Urine HCG:  Ordered for: Arvella Merles, MD, Rolm Gala         Status: Done by Huel Cote RN, Dorthey Sawyer Mar 18, 2011 10:53. (09:22         WCW)   Urine dip (send for lab U/A if positive):  Ordered for: Arvella Merles, MD,         Rolm Gala         Status: Done by Huel Cote RN, Dorthey Sawyer Mar 18, 2011 10:53. (09:22         WCW)   Blood Glucose:  Ordered for: Arvella Merles, MD, Rolm Gala         Status: Done by Huel Cote RN, Dorthey Sawyer Mar 18, 2011 09:50. (09:22         WCW)   CBC, AUTOMATED DIFFERENTIAL:  Ordered for: Arvella Merles, MD, Rolm Gala         Status: Done by System Tue Mar 18, 2011 10:24. (09:22 WCW)   COMPREHENSIVE METABOLIC PANEL:  Ordered for: Arvella Merles, MD, Rolm Gala         Status: Done by System Tue Mar 18, 2011 10:56. (09:22  WCW)   LIPASE:  Ordered for: Arvella Merles, MD, Rolm Gala         Status: Done by System Tue Mar 18, 2011 10:53. (09:22 WCW)   ABD COMP W/SINGLE VIEW CHEST:  Ordered for: Arvella Merles, MD, Rolm Gala         Status: Active. (10:58 ZOX0)   LACTIC ACID:  Ordered for: Arvella Merles, MD, Rolm Gala          Status: Done by System Tue Mar 18, 2011 12:13. (10:58 RUE4)   PO Fluid Challenage:  Ordered for: Arvella Merles, MD, Rolm Gala         Status: Done by Raynelle Jan RN, Brooke Tue Mar 18, 2011 12:31. (12:29         VWU9)   Pump Tubing - Smart Site:  Ordered for: Arvella Merles, MD, Rolm Gala         Status: Active. (14:40 JYG)      Ordered for: Arvella Merles, MD, Rolm Gala         Status: Active. (14:40 JYG)   IV Set - Primary Tubing:  Ordered for: Arvella Merles, MD, Rolm Gala         Status: Active. (14:40 JYG)   IV Start kit:  Ordered for: Arvella Merles, MD, Rolm Gala         Status: Active. (14:40 JYG)   Elita Boone IV Cath:  Ordered for: Arvella Merles, MD, Rolm Gala         Status: Active. (14:40 JYG)   IV Pump- Single:  Ordered for: Arvella Merles, MD, Rolm Gala         Status: Active. (14:40 JYG)       NURSING ASSESSMENT: ABDOMEN   CONSTITUTIONAL: Complex assessment performed, History obtained         from patient, Patient arrives ambulatory, Gait steady, Patient         appears, in distress due to pain, Patient cooperative, Patient alert,         Oriented to person, place and time, Skin warm, Skin dry, Skin normal         in color, Mucous membranes pink, Mucous membranes moist, Patient is         well-groomed. (09:25 JYG)   PAIN: aching pain, mid upper quads, constant, on a scale 0-10         patient rates pain as 10. (09:25 JYG)     aching pain, mid upper quads, constant, on a scale 0-10 patient rates         pain as 8, pt c/o that pain has minimally decreased; Dr. Arvella Merles and         Lenox Ponds advised. (10:26 JYG)   ABDOMEN: Abdomen assessment findings include abdomen symmetrical,         Abdomen soft, tender, mid upper quads, Bowel sound normal, Patient's         last bowel was 03/17/2011, Associated with nausea, Associated with         vomiting, no associated diarrhea, no associated constipation, Notes:         pt heaving and spitting. (09:25 JYG)   LMP: Birth control use:, depo. (09:25 JYG)   GENITOURINARY FEMALE: no associated urinary complaints. (09:25         JYG)    SAFETY: Side rails up, Cart/Stretcher in lowest position, Call         light within reach, Hospital ID band on. (09:25 JYG)       NURSING PROCEDURE: COMMUNICATIONS   COMMUNICATIONS: Notes: Pt stated she was unable to urinate.         (  10:35 JYG)     Critical lab value, received at 1057, received from [facility], Critical         lab result: K 2.8, given to Dr. Arvella Merles, Lenox Ponds. (10:57         JYG)       NURSING PROCEDURE: DISCHARGE NOTE (14:28 JYG)   DISCHARGE: Patient discharged to home, ambulating without         assistance, friend driving, accompanied by friend, Summary of Care         printed/ provided, Discharge instructions given to patient, Simple or         moderate discharge teaching performed, Prescriptions given and         instructions on side effects given, Name of prescription(s) given:         zofran, reglan, Above person(s) verbalized understanding of discharge         instructions and follow-up care, IV discontinued at 1420.   VITAL SIGNS: BP: 115, / 70, BP: (Lying), Pulse: 95, Resp: 16,         Pain: 4, O2 sat: 100, on Room air.       NURSING PROCEDURE: IV (09:30 CLS1)   PATIENT IDENITIFIER: Patient's identity verified by patient         stating name, Patient's identity verified by patient stating birth         date, Patient's identity verified by hospital ID bracelet.   IV SITE 1: IV established, to the right antecubital, using a 20         gauge catheter, in one attempt, Flushed with normal saline (mls):         10CC, Tourniquet removed from patient after procedure., Notes: BLOODS         SENT TO LAB @1000 .   FOLLOW-UP SITE 1: After procedure, sterile dressing applied.   NOTES: Patient tolerated procedure well.   SAFETY: Side rails up, Cart/Stretcher in lowest position, Call         light within reach, Hospital ID band on.       NURSING PROCEDURE: LAB DRAW (11:30 JYG)   PATIENT IDENTIFIER: Patient's identity verified by patient          stating name, Patient's identity verified by patient stating birth         date, Patient's identity verified by hospital ID bracelet.   LAB DRAW: Lab draw indicated for obtaining specimens for         evaluation, Subsequent lab draw performed, from vascular access         device, existing IV site, rac 20, After labs drawn, device flushed         with saline, amount (mL) 10, Lab specimens labeled in the presence of         the patient and sent to lab, Tourniquet removed from patient after         procedure.   SAFETY: Side rails up, Cart/Stretcher in lowest position, Call         light within reach, Hospital ID band on.       NURSING PROCEDURE: PO CHALLENGE   PATIENT IDENTIFIER: Patient's identity verified by patient         stating name, Patient's identity verified by patient stating birth         date, Patient's identity verified by hospital ID bracelet. (12:23         BNP)   PO CHALLENGE: Oral fluid challenge indicated for documenting oral  intake prior to discharge, Oral challenge performed, patient given         water, amount (mL) 240. (12:23 BNP)   FOLLOW-UP: After procedure, patient tolerated oral challenge,         Notes: Patient tolerated challenge however reports feeling nauseous.         (12:31 BNP)   SAFETY: Side rails up, Cart/Stretcher in lowest position, Call         light within reach, Hospital ID band on. (12:23 BNP)       NURSING PROCEDURE: TRANSPORT TO TESTS (11:14 MNL)   PATIENT IDENTIFIER: Patient's identity verified by patient         stating name, Patient's identity verified by patient stating birth         date, Patient's identity verified by hospital ID bracelet, Patient's         identity verified by family member.   TRANSPORT TO TESTS: Patient transported to x-ray, via cart,         Accompanied by x-ray technician.       NURSING PROCEDURE: URINE COLLECTION (10:50 JYG)   PATIENT IDENTIFIER: Patient's identity verified by patient          stating name, Patient's identity verified by patient stating birth         date, Patient's identity verified by hospital ID bracelet, Patient         actively involved in identification process.   URINE COLLECTION FEMALE: Urine collection indicated for patient         unable to void, Urine collected by straight cath, using an 38fr         catheter kit, output amount (mL) 30, urine yellow in color, and         clear.   SAFETY: Side rails up, Cart/Stretcher in lowest position, Call         light within reach, Hospital ID band on.       DIAGNOSIS (14:07 EHK1)   FINAL: PRIMARY: Gastroparesis, ADDITIONAL: Dehydration         (hypovolemia), Nausea and vomiting.       DISPOSITION   PATIENT:  Disposition Type: Discharged, Disposition: Discharge,         Disposition Transport: Family/Friend drive, Condition: Stable. (14:07         EHK1)      Patient left the department. (14:40 JYG)       VITAL SIGNS   VITAL SIGNS: BP: 108/96, Pulse: 111, Resp: 18, Temp: 98.2 (Oral),         Pain: 9, O2 sat: 100 on Room air, Time: 03/18/2011 08:55. (08:55         MML1)     Pain: 8, Time: 03/18/2011 10:05. (10:05 JYG)     BP: 128/77 (Lying), Pulse: 98, Resp: 16, Temp: 97.9 (Oral), Pain: 8, O2         sat: 100 on Room air, Time: 03/18/2011 12:00. (12:00 CLS1)     BP: 115/70 (Lying), Pulse: 95, Resp: 16, Pain: 4, O2 sat: 100 on Room         air, Time: 03/18/2011 14:28. (14:28 JYG)     Pain: 4, Time: 03/18/2011 12:05. (12:05 JYG)       INSTRUCTION (14:10 EHK1)   DISCHARGE:  GASTROPARESIS-(CGH), VOMITING (NAUSEA, THROWING UP),         DEHYDRATION - IV FLUIDS.   FOLLOWUP:  [facility]US[facility] S[facility], [address], [place]  [place] [id number], [phone].   SPECIAL:  Return if vomit, fever, more pain, short of breath         Follow up with primary care physician         Return to the ER if condition worsens or new symptoms develop.         Advance activity as tolerated         Encourage fluids          Clear liquid diet. Advance as tolerated.       PRESCRIPTION (14:09 EHK1)   Reglan:  Tablet : 10 mg : Oral : Quantity: *** 1 *** Unit: tab(s)         Route: Oral Schedule: As needed every 6 hours Dispense: *** 10 ***.   NOTES:  No refills          May use generic.   Zofran:  Tablet : 4 mg : Oral : Quantity: *** 1 *** Unit: tab(s)         Route: Oral Schedule: As needed every 4 to 6 hours Dispense: *** 15         ***.   NOTES:  FOR NAUSEA          No refills          May use generic.       ADMIN (14:41 JYG)   DIGITAL SIGNATURE:  Huel Cote, RN, Shanda Bumps.   Key:     BNP=Priddy, RN, Brooke  CLS1=Spurill, ACT III, Shea Evans, MD, Shirlean Schlein, RN, Shanda Bumps  MML1=Lopez, RN, Monica  MNL=Lenthall, RAD     [facility], Mike     WCW=Warren, PA-C, Chrissie Noa

## 2011-03-19 NOTE — Procedures (Signed)
Test Reason : Other Reasons-Enter in Comments   Blood Pressure : ***/*** mmHG   Vent. Rate : 103 BPM     Atrial Rate : 103 BPM      P-R Int : 122 ms          QRS Dur : 072 ms       QT Int : 340 ms       P-R-T Axes : 077 073 051 degrees      QTc Int : 445 ms   Sinus tachycardia   Otherwise normal ECG   When compared with ECG of 26-Nov-2010 17:12,   No significant change was found   Confirmed by Renaee Munda M.D., Onalee Hua [phone]) on 03/20/2011 12:55:34 PM   Referred By:             Gay Filler By: Jarold Song.D.

## 2011-03-19 NOTE — ED Provider Notes (Signed)
MEDICATION ADMINISTRATION SUMMARY              Drug Name: Morphine Sulfate, Dose Ordered: 4 mg, Route: IV Push,         Status: Given, Time: 12:11 03/19/2011,          Drug Name: Novolin R, Dose Ordered: * Per Protocol, Route: IV Med         Infusion, Status: Given, Time: 11:56 03/19/2011,          Drug Name: Bentyl, Dose Ordered: 20 mg, Route: Intramuscular, Status:         Given, Time: 10:09 03/19/2011,          Drug Name: Zofran, Dose Ordered: 4 mg, Route: IV Push, Status: Given,         Time: 10:08 03/19/2011,          Drug Name: *Sodium Chloride 0.9%, Intravenous, Dose Ordered: 1000 mL,         Route: IV Fluid, Status: Given, Time: 10:07 03/19/2011, *Additional         information available in notes, Detailed record available in         Medication Service section.       KNOWN ALLERGIES   Penicillins       TRIAGE (09:03 KLH1)   PATIENT: NAME: [name] [name], AGE: 26, GENDER: female, DOB: Wed         1985-05-02, TIME OF GREET: Wed Mar 19, 2011 08:57, SSN: [id number],         KG WEIGHT: 54.9, HEIGHT: 165cm, MEDICAL RECORD NUMBER: [phone],         ACCOUNT NUMBER: [id number], PCP: Melton Krebs,. (09:03         KLH1)   ADMISSION: URGENCY: 3, TRANSPORT: Ambulatory, DEPT: Emergency,         BED: 2ED 28. (09:03 KLH1)   VITAL SIGNS: BP 170/113, (Sitting), Pulse 127, Resp 18, Temp         98.6, (Oral), Pain 6, O2 Sat 100, on Room air, Time 03/19/2011 09:01.         (09:01 KLH1)   COMPLAINT:  N/V Stomach Pain. (09:03 KLH1)   PRESENTING COMPLAINT:  vomiting X2 days- FSBS 123 this am. (09:10         RLY1)   TB SCREENING: TB screen negative for this patient. (09:10         RLY1)   ABUSE SCREENING: Patient denies physical abuse or threats. (09:10         RLY1)   FALL RISK: Patient has a low risk of falling, Patient has no         history of falling (0), No secondary diagnosis (0), None/bed         rest/nurse assist (0), IV or IV Access (20), Normal/bed         rest/wheelchair (0), Oriented to own ability (0), Total 20. (09:10          RLY1)   SUICIDAL IDEATION: Suicidal ideation is not present. (09:10         RLY1)   ADVANCE DIRECTIVES: Patient does not have advance directives.         (09:10 RLY1)   PROVIDERS: TRIAGE NURSE: Kathlen Brunswick, RN. (09:03 KLH1)   PREVIOUS VISIT ALLERGIES: Penicillins. (09:03 KLH1)       PRESENTING PROBLEM (09:03 KLH1)      Presenting problems: Abdominal Pain, Nausea, Vomiting.       CURRENT MEDICATIONS (09:03 KLH1)   Lantus:  30 units Subcutaneous once a day (at bedtime).   Humalog:  SSC See Notes. insulin pump - titrate according to         glucose levels.       MEDICATION SERVICE   Bentyl:  Order: Bentyl (Dicyclomine Hydrochloride) -         Dose: 20 mg : Intramuscular         Ordered by: Lorel Monaco, PA-C         Entered by: Lorel Monaco, PA-C Wed Mar 19, 2011 09:20 ,          Acknowledged by: Jorene Guest, RN Wed Mar 19, 2011 09:49         Documented as given by: Jorene Guest, RN Wed Mar 19, 2011 10:09          Patient, Medication, Dose, Route and Time verified prior to         administration.         IM medication, Amount given: 20mg , Medication administered to left         hip, Correct patient, time, route, dose and medication confirmed         prior to administration, Patient advised of actions and side-effects         prior to administration, Allergies confirmed and medications reviewed         prior to administration, Patient in position of comfort, Side rails         up, Cart in lowest position, Call light in reach.    : Follow Up : Response assessment performed, No signs or         symptoms of allergic reaction noted, Decreased pain. (11:00         RLY1)   Morphine Sulfate:  Order: Morphine Sulfate - Dose: 4 mg         : IV Push         Ordered by: Ronne Binning, MD         Entered by: Ronne Binning, MD Wed Mar 19, 2011 11:39 ,          Acknowledged by: Jorene Guest, RN Wed Mar 19, 2011 11:58         Documented as given by: Jorene Guest, RN Wed Mar 19, 2011 12:11           Patient, Medication, Dose, Route and Time verified prior to         administration.          Amount given: 4mg , IV SITE #1 into left antecubital, IV SITE #1 IVP,         subsequent different medication, Slowly, Connections checked prior to         administration, Line traced prior to administration, Catheter         placement confirmed via flush prior to administration, IV site         without signs or symptoms of infiltration during medication         administration, No swelling during administration, No drainage during         administration, IV flushed after administration, Correct patient,         time, route, dose and medication confirmed prior to administration,         Patient advised of actions and side-effects prior to administration,         Allergies confirmed and medications reviewed prior to administration,         Patient in position of  comfort, Side rails up, Cart in lowest         position, Call light in reach.    : Follow Up : Response assessment performed, No signs or         symptoms of allergic reaction noted, Decreased pain, _IV SITE #1:_.         (12:45 RLY1)   Novolin R:  Order: Novolin R (Insulin Regular Human Recombinant)         - Dose: * Per Protocol : IV Med Infusion         Ordered by: Ronne Binning, MD         Entered by: Ronne Binning, MD Wed Mar 19, 2011 11:08 ,          Co-signed by: Trecia Rogers, RN, CEN Wed Mar 19, 2011 11:32,          Acknowledged by: Trecia Rogers, RN, CEN Wed Mar 19, 2011 11:33         Documented as given by: Jorene Guest, RN Wed Mar 19, 2011 11:56          Patient, Medication, Dose, Route and Time verified prior to         administration.          IV SITE #1 into right antecubital, IV SITE #1 IVPB or drip, initial         infusion, Premixed, via pump tubing, Connections checked prior to         administration, Line traced prior to administration, Catheter         placement confirmed via flush prior to administration, IV site          without signs or symptoms of infiltration during medication         administration, No swelling during administration, No drainage during         administration, Correct patient, time, route, dose and medication         confirmed prior to administration, Patient advised of actions and         side-effects prior to administration, Allergies confirmed and         medications reviewed prior to administration, Patient in position of         comfort, Side rails up, Cart in lowest position, Call light in reach,         DKA Regular Insulin drip. Initial rate 9 units/hr at 31ml/hr.    : Follow Up : _IV SITE #1:_, Medication infusion continued upon         transfer from emergency department, on Wed Mar 19, 2011 13:43, Total         infusion time IV site 1 1 hour, 50 minutes, ., Total amount infused:         180. (13:43 RLY1)   Sodium Chloride 0.9%, Intravenous:  Order: Sodium Chloride 0.9%,         Intravenous (Sodium Chloride) - Dose: 1000 mL : IV Fluid         Notes: Bolus         *Reminder* Enter reason for fluid below         For Hydration         Ordered by: Lorel Monaco, PA-C         Entered by: Lorel Monaco, PA-C Wed Mar 19, 2011 09:17 ,          Acknowledged by: Jorene Guest, RN Wed Mar 19, 2011 09:49  Documented as given by: Jorene Guest, RN Wed Mar 19, 2011 10:07          Patient, Medication, Dose, Route and Time verified prior to         administration.          IV SITE #1 into right antecubital, IV SITE #1 IV fluids established,         IV SITE #1 1st bag hung, amount 1 Liter, IV SITE #1 bolus of 1000 ml         established, IV SITE #1 Rate of bolus, wide open, via gravity tubing,         Catheter placement confirmed via flush prior to administration, IV         site without signs or symptoms of infiltration during medication         administration, No swelling during administration, No drainage during         administration, Correct patient, time, route, dose and medication          confirmed prior to administration, Patient advised of actions and         side-effects prior to administration, Allergies confirmed and         medications reviewed prior to administration, Patient in position of         comfort, Side rails up, Cart in lowest position, Call light in reach.    : Follow Up : _IV SITE #1:_, IV fluid infusion discontinued, on         Wed Mar 19, 2011 13:42, Total fluid hydration time IV site 1 3 hours,         35 minutes, ., Total amount infused: . (13:42 RLY1)   Zofran:  Order: Zofran (Ondansetron Hydrochloride) -         Dose: 4 mg : IV Push         Ordered by: Lorel Monaco, PA-C         Entered by: Lorel Monaco, PA-C Wed Mar 19, 2011 09:18 ,          Acknowledged by: Jorene Guest, RN Wed Mar 19, 2011 09:49         Documented as given by: Jorene Guest, RN Wed Mar 19, 2011 10:08          Patient, Medication, Dose, Route and Time verified prior to         administration.          Amount given: 4mg , IV SITE #1 into right antecubital, IV SITE #1         IVP, initial medication, Slowly, Connections checked prior to         administration, Line traced prior to administration, Catheter         placement confirmed via flush prior to administration, IV site         without signs or symptoms of infiltration during medication         administration, No swelling during administration, No drainage during         administration, Correct patient, time, route, dose and medication         confirmed prior to administration, Patient advised of actions and         side-effects prior to administration, Allergies confirmed and         medications reviewed prior to administration, Patient in position of         comfort, Side rails up, Cart in lowest position, Call light in  reach.       ORDERS   IV- Saline Lock:  Ordered for: Truddie Crumble, MD, Rob         Status: Done by Maple Hudson RN, Felizardo Hoffmann Mar 19, 2011 09:28. (09:13         RLY1)   One Click Abdominal Pain:  Ordered for: Truddie Crumble, MD, Rob          Status: Done by Maple Hudson RN, Felizardo Hoffmann Mar 19, 2011 10:14. (09:13         RLY1)   O2 sat Monitor:  Ordered for: Truddie Crumble, MD, Rob         Status: Done by Maple Hudson RN, Felizardo Hoffmann Mar 19, 2011 10:14. (09:17         EJS1)   BP Monitor:  Ordered for: Truddie Crumble, MD, Rob         Status: Done by Maple Hudson RN, Felizardo Hoffmann Mar 19, 2011 10:14. (09:17         Melanie.Starr)   Cardiac Monitor:  Ordered for: Truddie Crumble, MD, Rob         Status: Done by Maple Hudson RN, Felizardo Hoffmann Mar 19, 2011 10:14. (09:17         EJS1)   URINE DRUG SCREEN:  Ordered for: Truddie Crumble, MD, Rob         Status: Done by System Wed Mar 19, 2011 10:11. (09:17 EJS1)   CBC, AUTOMATED DIFFERENTIAL:  Ordered for: Truddie Crumble, MD, Rob         Status: Done by System Wed Mar 19, 2011 10:23. (09:17 EJS1)   Urine dip (send for lab U/A if positive):  Ordered for:         Truddie Crumble, MD, Rob         Status: Done by Maple Hudson RN, Felizardo Hoffmann Mar 19, 2011 09:38. (09:17         Ramond Dial)   LIPASE:  Ordered for: Truddie Crumble, MD, Rob         Status: Done by System Wed Mar 19, 2011 10:29. (09:17 EJS1)   COMPREHENSIVE METABOLIC PANEL:  Ordered for: Truddie Crumble, MD, Rob         Status: Done by System Wed Mar 19, 2011 10:29. (09:17 Melanie.Starr)   CHEST 2 VIEWS:  Ordered for: Truddie Crumble, MD, Rob         Status: Cancelled by Orson Aloe Mar 19, 2011 10:13.         (10:12 EJS1)   ACETONE QUAL:  Ordered for: Truddie Crumble, MD, Rob         Status: Done by System Wed Mar 19, 2011 11:10. (10:13 Melanie.Starr)   ED venous blood gas G3+ ABG w/ pH:  Ordered for: Truddie Crumble, MD,         Rob         Status: Done by Maple Hudson RN, Felizardo Hoffmann Mar 19, 2011 10:45. (10:32         RES)   Page on-call [place]:  Ordered for: Truddie Crumble, MD, Rob         Status: Done by Deforest Hoyles Wed Mar 19, 2011 11:08. (11:07         RES)   REGULAR for Dr Benn Moulder:  Lenna Gilford for: Truddie Crumble, MD, Rob         Status: Done by Deforest Hoyles Wed Mar 19, 2011 11:24. (11:22         RES)   Elita Boone IV Cath:  Ordered for: Truddie Crumble, MD, Rob  Status: Active. (13:47 RLY1)   IV Start kit:  Ordered for: Truddie Crumble, MD, Rob         Status: Active. (13:47 RLY1)   IV Set - Primary Tubing:  Ordered for: Truddie Crumble, MD, Rob         Status: Active. (13:47 RLY1)   BP Cuff Adult Regular:  Ordered for: Truddie Crumble, MD, Rob         Status: Active. (13:47 RLY1)   CONTINUOUS PULSE OX:  Ordered for: Truddie Crumble, MD, Rob         Status: Active. (13:47 RLY1)   MONITOR ELECTRODE:  Ordered for: Truddie Crumble, MD, Rob         Status: Active. (13:47 RLY1)   IV Set - Primary Tubing:  Ordered for: Truddie Crumble, MD, Rob         Status: Active. (13:47 RLY1)       NURSING ASSESSMENT: ABDOMEN WITH PROCEDURES (09:10 RLY1)   CONSTITUTIONAL: History obtained from patient, Patient arrives,         via hospital wheelchair, Gait steady, Patient appears, in distress         due to pain, Patient cooperative, Patient alert, Oriented to person,         place and time, Skin warm, Skin dry, Skin normal in color, Mucous         membranes pink, Mucous membranes moist, Patient is well-groomed.   PAIN: aching pain, to the left upper quadrant, to the epigastric         region, on a scale 0-10 patient rates pain as 9.   ABDOMEN: Abdomen assessment findings include abdomen symmetrical,         Abdomen soft, tender, to the left upper quadrant, no pulsatile mass,         Bowel sounds, hyperactive, to all four quadrants, Associated with         nausea, Associated with vomiting, currently, Associated with appetite         change, decrease.   GENITOURINARY FEMALE: no associated urinary complaints.   SAFETY: Side rails up, Cart/Stretcher in lowest position, Family         at bedside, Call light within reach, Hospital ID band on.       NURSING PROCEDURE: ADMISSION (13:40 RLY1)   ADMISSION: Patient admitted to a medical-surgical unit, room         number 5228, Report faxed to unit, Fax receipt confirmed, by lundy,         Acuity level non-urgent, Transported via cart/stretcher, Accompanied          by emergency department technician.       NURSING PROCEDURE: EKG CHART (12:23 MWN0)   PATIENT IDENTIFIER: Patient's identity verified by patient         stating name, Patient's identity verified by patient stating birth         date, Patient's identity verified by hospital ID bracelet.   EKG: 12 lead EKG performed on the left chest, done by mwn, first         EKG.   FOLLOW-UP: After procedure, EKG for interpretation given to Dr.         Truddie Crumble, EKG was given to Dr. at 1223.   SAFETY: Side rails up, Cart/Stretcher in lowest position, Call         light within reach, Hospital ID band on.       NURSING PROCEDURE: INTAKE AND OUTPUT (11:58 RLY1)   INTAKE AND OUTPUT: IV intake(ml): 1000, Total Intake (  ml):         , Total Output (ml): 0ml, Grand Total: Intake is greater than         output by .       NURSING PROCEDURE: IV (09:10 RLY1)   PATIENT IDENITIFIER: Patient's identity verified by patient         stating name, Patient's identity verified by patient stating birth         date, Patient's identity verified by hospital ID bracelet.   IV SITE 1: IV therapy indicated for hydration, IV therapy         indicated for medication administration, IV established, to the right         antecubital, using a 20 gauge catheter, in one attempt, Flushed with         normal saline (mls): 10ml, Labs drawn at time of placement, labeled         in the presence of the patient and sent to lab, Tourniquet removed         from patient after procedure., Labs labeled in the presence of the         patient and then sent to the Lab.       NURSING PROCEDURE: NURSE NOTES   NURSES NOTES: Notes: DKA Standard Insulin drip started at 9         units/hr and IV fluids- 1/2 NS with 78meq/KCL at 257ml/hr started per         protocol. (11:55 RLY1)     Notes: next Glucose/CO2 level due at 1355. (12:33 RLY1)       DIAGNOSIS (11:22 RES)   FINAL: PRIMARY: Diabetic Ketoacidosis - IDDM, Type I (DKA).       DISPOSITION    PATIENT:  Disposition Type: Inpatient, Disposition: Regular Bed         Admission, Condition: Stable. (11:22 RES)      Patient left the department. (14:11 RLY1)       VITAL SIGNS   VITAL SIGNS: BP: 170/113 (Sitting), Pulse: 127, Resp: 18, Temp:         98.6 (Oral), Pain: 6, O2 sat: 100 on Room air, Time: 03/19/2011 09:01.         (09:01 KLH1)     BP: 145/80 (Lying), Pulse: 115, Resp: 16, Pain: 9, O2 sat: 100 on Room         air, Time: 03/19/2011 12:13. (12:13 RLY1)     BP: 143/78 (Lying), Pulse: 105, Resp: 16, Pain: 0, O2 sat: 100 on Room         air, Time: 03/19/2011 12:23. (12:23 MWN0)       PRESCRIPTION     No recorded prescriptions       ADMIN (13:47 RLY1)   DIGITAL SIGNATURE:  Young, RN, Robin.   Key:     EJS1=Salch, PA-C, Cheri Kearns, RN, Clydie Braun  MWN0=Newsome, PM, Annabell Howells, MD, Rob  RLY1=Young, RN, Zella Ball

## 2011-03-19 NOTE — Procedures (Signed)
Test Reason : Other Reasons-Enter in Comments   Blood Pressure : ***/*** mmHG   Vent. Rate : 103 BPM     Atrial Rate : 103 BPM      P-R Int : 122 ms          QRS Dur : 072 ms       QT Int : 340 ms       P-R-T Axes : 077 073 051 degrees      QTc Int : 445 ms   Sinus tachycardia   Otherwise normal ECG   When compared with ECG of 26-Nov-2010 17:12,   No significant change was found   Confirmed by Adler M.D., David ([id number]) on 03/20/2011 12:55:34 PM   Referred By:             Overread By: David Adler M.D.

## 2011-05-26 NOTE — Procedures (Signed)
Test Reason : Abdominal pain-general   Blood Pressure : ***/*** mmHG   Vent. Rate : 088 BPM     Atrial Rate : 088 BPM      P-R Int : 120 ms          QRS Dur : 072 ms       QT Int : 384 ms       P-R-T Axes : 079 071 069 degrees      QTc Int : 464 ms   Normal sinus rhythm with sinus arrhythmia   Possible Left atrial enlargement   Nonspecific ST and T wave abnormality   When compared with ECG of 19-Mar-2011 12:16,   T wave amplitude has increased in Anterior leads   The rate has slowed   Confirmed by Alene Mires, M.D., Verdon Cummins [phone]) on 05/27/2011 4:19:26 PM   Referred By:             Overread By: Gita Kudo, M.D.

## 2011-05-26 NOTE — ED Provider Notes (Signed)
MEDICATION ADMINISTRATION SUMMARY              Drug Name: Reglan, Dose Ordered: 10 mg, Route: IV Push, Status:         Given, Time: 22:22 05/26/2011,          Drug Name: DiphenhydrAMINE Hydrochloride, Dose Ordered: 25 mg, Route:         IV Push, Status: Given, Time: 22:22 05/26/2011,          Drug Name: *Sodium Chloride 0.45% with Potassium Ch, Dose Ordered:         125 mL/hr, Route: IV Fluid, Status: Given, Time: 21:35 05/26/2011,          Drug Name: Novolin R, Dose Ordered: * Per Protocol, Route: IV Med         Infusion, Status: Given, Time: 20:43 05/26/2011,          Drug Name: Morphine Sulfate, Dose Ordered: 4 mg, Route: IV Push,         Status: Given, Time: 19:44 05/26/2011,          Drug Name: Zofran, Dose Ordered: 4 mg, Route: IV Push, Status: Given,         Time: 19:44 05/26/2011,          Drug Name: *Sodium Chloride 0.9%, Intravenous, Dose Ordered: 1000 mL,         Route: IV Fluid, Status: Given, Time: 19:38 05/26/2011, *Additional         information available in notes, Detailed record available in         Medication Service section.       KNOWN ALLERGIES   Penicillins       TRIAGE (Mon May 26, 2011 18:52 [facility])   PATIENT: NAME: [name] [name], AGE: 26, GENDER: female, DOB: Wed         [birth year], TIME OF GREET: Mon May 26, 2011 18:46, LANGUAGE:         [place], [place]: [id number], KG WEIGHT: 54.9, HEIGHT: 167cm, MEDICAL         RECORD NUMBER: [id number], ACCOUNT NUMBER: [id number], PCP: Melton Krebs,. (Mon May 26, 2011 18:52 [facility])   ADMISSION: URGENCY: 3, TRANSPORT: Wheelchair, DEPT: Emergency,         BED: WAITING. (Mon May 26, 2011 18:52 KLH1)   VITAL SIGNS: BP 147/86, (Sitting), Pulse 89, Resp 16, Temp 97.9,         (Oral), Pain 9, O2 Sat 100, on Room air, Time 05/26/2011 18:51. (18:51         KLH1)   COMPLAINT:  Abd Pain. (Mon May 26, 2011 18:52 KLH1)   PRESENTING COMPLAINT:  Abdominal pain, Since Today. (19:10         KPC0)   PAIN: Patient complains of pain, On a scale 0-10 patient rates          pain as 9. (19:10 KPC0)   TREATMENT PRIOR TO ARRIVAL: None. (19:10 KPC0)   TB SCREENING: TB screen negative for this patient. (19:10         KPC0)   ABUSE SCREENING: Patient denies physical abuse or threats. (19:10         KPC0)   FALL RISK: Patient has a low risk of falling. (19:10 KPC0)   SUICIDAL IDEATION: Suicidal ideation is not present. (19:10         KPC0)   ADVANCE DIRECTIVES: Patient does not have advance directives.         (  19:10 KPC0)   PROVIDERS: TRIAGE NURSE: Kathlen Brunswick, RN. (Mon May 26, 2011 18:52         [facility])   PREVIOUS VISIT ALLERGIES: Penicillins. (Mon May 26, 2011 18:52         KLH1)       PRESENTING PROBLEM (18:52 KLH1)      Presenting problems: Abdominal Pain.       CURRENT MEDICATIONS (19:12 KPC0)   Erythromycin   Humalog:  SSC See Notes. insulin pump - titrate according to         glucose levels.       MEDICATION SERVICE   DiphenhydrAMINE Hydrochloride:  Order: DiphenhydrAMINE         Hydrochloride (Diphenhydramine Hydrochloride) - Dose: 25 mg         : IV Push         Ordered by: Hildred Priest, MD         Entered by: Hildred Priest, MD Mon May 26, 2011 21:34 ,          Acknowledged by: Stefanie Libel May 26, 2011 21:40         Documented as given by: Stefanie Libel May 26, 2011 22:22          Patient, Medication, Dose, Route and Time verified prior to         administration.          Amount given: 25mg , IV SITE #2 into right upper arm, IVP, Initial         medication, Connections checked prior to administration, Line traced         prior to administration, Catheter placement confirmed via flush prior         to administration, IV site without signs or symptoms of infiltration         during medication administration, No swelling during administration,         No drainage during administration, Correct patient, time, route, dose         and medication confirmed prior to administration, Patient advised of         actions and side-effects prior to administration, Allergies confirmed          and medications reviewed prior to administration.    : Follow Up : Response assessment performed, No signs or         symptoms of allergic reaction noted, Decreased nausea, _IV SITE #2:_.         (23:15 TKT1)   Morphine Sulfate:  Order: Morphine Sulfate - Dose: 4 mg         : IV Push         Ordered by: Lorel Monaco, PA-C         Entered by: Lorel Monaco, PA-C Mon May 26, 2011 19:17 ,          Acknowledged by: Stefanie Libel May 26, 2011 19:24         Documented as given by: Stefanie Libel May 26, 2011 19:44          Patient, Medication, Dose, Route and Time verified prior to         administration.          Amount given: 4mg , IV SITE #1 into left hand, IV SITE #1 IVP,         initial medication, Slowly, Connections checked prior to         administration, Line traced prior to administration, Catheter  placement confirmed via flush prior to administration, IV site         without signs or symptoms of infiltration during medication         administration, No swelling during administration, No drainage during         administration, Correct patient, time, route, dose and medication         confirmed prior to administration, Patient advised of actions and         side-effects prior to administration, Allergies confirmed and         medications reviewed prior to administration.   Morphine Sulfate: Response assessment performed, No signs or         symptoms of allergic reaction noted, _IV SITE #1:_, Pain: 8. (22:23         TKT1)   Novolin R:  Order: Novolin R (Insulin Regular Human Recombinant)         - Dose: * Per Protocol : IV Med Infusion         Ordered by: Lorel Monaco, PA-C         Entered by: Lorel Monaco, PA-C Mon May 26, 2011 19:14 ,          Acknowledged by: Stefanie Libel May 26, 2011 19:24,          Co-signed by: Adele Dan, RN, BSN Mon May 26, 2011 20:36         Documented as given by: Stefanie Libel May 26, 2011 20:43           Patient, Medication, Dose, Route and Time verified prior to         administration.          Amount given: 9 UNITS/HR; 90ML/HR, IV SITE #1 into left hand, IV         SITE #1 IVPB or drip, initial infusion, IVPB mixed in: , IVPB         mixed in: 0.45%ns, via pump tubing, on an IV pump, Connections         checked prior to administration, Line traced prior to administration,         Catheter placement confirmed via flush prior to administration, IV         site without signs or symptoms of infiltration during medication         administration, No swelling during administration, No drainage during         administration, IV flushed after administration, Correct patient,         time, route, dose and medication confirmed prior to administration,         Patient advised of actions and side-effects prior to administration,         Allergies confirmed and medications reviewed prior to administration,         BS=398.    : Follow Up : Response assessment performed, _IV SITE #1:_,         Titrating to patient response, Dose decreased, Medication infusion         changed to 4u/hr;72ml/hr, BS=273. (22:00 TKT1)    : Follow Up : Response assessment performed, No signs or         symptoms of allergic reaction noted, _IV SITE #1:_, Titrating to         patient response, Dose decreased, Medication infusion changed to         2.6u/hr;88ml/hr, BS=209. (23:18 TKT1)    : Follow Up : Response assessment performed, No  signs or         symptoms of allergic reaction noted, _IV SITE #1:_, Titrating to         patient response, Medication infusion changed to 2.6u/hr;2ml/hr,         BS=231. (Tue May 27, 2011 00:31 TKT1)    : Follow Up : Response assessment performed, No signs or         symptoms of allergic reaction noted, _IV SITE #1:_, Medication         infusion continued upon transfer from emergency department, on Tue         May 27, 2011 00:39, Total infusion time IV site 1 4 hours, . (Tue May 27, 2011 00:39 TKT1)    Reglan:  Order: Reglan (Metoclopramide Hydrochloride) -         Dose: 10 mg : IV Push         Ordered by: Hildred Priest, MD         Entered by: Hildred Priest, MD Mon May 26, 2011 21:34 ,          Acknowledged by: Stefanie Libel May 26, 2011 21:40         Documented as given by: Stefanie Libel May 26, 2011 22:22          Patient, Medication, Dose, Route and Time verified prior to         administration.          Amount given: 10mg , IV SITE #2 into right upper arm, IVP, Initial         medication, Connections checked prior to administration, Line traced         prior to administration, Catheter placement confirmed via flush prior         to administration, IV site without signs or symptoms of infiltration         during medication administration, No swelling during administration,         No drainage during administration, Correct patient, time, route, dose         and medication confirmed prior to administration, Patient advised of         actions and side-effects prior to administration, Allergies confirmed         and medications reviewed prior to administration.    : Follow Up : Response assessment performed, No signs or         symptoms of allergic reaction noted, Decreased nausea, _IV SITE #2:_.         (23:15 TKT1)   Sodium Chloride 0.45% with Potassium Chloride 20 mEq/L:  Order:         Sodium Chloride 0.45% with Potassium Chloride 20 mEq/L (Potassium         Chloride/Sodium Chloride) - Dose: 125 mL/hr : IV Fluid         Notes: per insulin drip protocol         Ordered by: Hildred Priest, MD         Entered by: Stefanie Libel May 26, 2011 22:05 ,          Acknowledged by: Stefanie Libel May 26, 2011 22:06         Documented as given by: Stefanie Libel May 26, 2011 21:35          Patient, Medication, Dose, Route and Time verified prior to         administration.  Amount given: , IV SITE #2 into right upper arm, IVPB or drip,          initial infusion, Premixed, via pump tubing, on an IV pump.    : Follow Up : Response assessment performed, No signs or         symptoms of allergic reaction noted, _IV SITE #2:_, IV fluid infusion         continued upon transfer from emergency department, on Tue May 27, 2011 00:38, Total fluid hydration time IV site 2 3 hours, 5 minutes,         . (Tue May 27, 2011 00:40 TKT1)   Sodium Chloride 0.9%, Intravenous:  Order: Sodium Chloride 0.9%,         Intravenous (Sodium Chloride) - Dose: 1000 mL : IV Fluid         Notes: Bolus         *Reminder* Enter reason for fluid below         For Hydration         Ordered by: Lorel Monaco, PA-C         Entered by: Lorel Monaco, PA-C Mon May 26, 2011 19:18 ,          Acknowledged by: Stefanie Libel May 26, 2011 19:24         Documented as given by: Stefanie Libel May 26, 2011 19:38          Patient, Medication, Dose, Route and Time verified prior to         administration.          Amount given: , IV SITE #1 into left hand, IV SITE #1 IV         fluids established, IV SITE #1 1st bag hung, amount 1 Liter, IV SITE         #1 bolus of 1000 ml established, via dial-a-flow tubing, Connections         checked prior to administration, Line traced prior to administration,         Catheter placement confirmed via flush prior to administration, IV         site without signs or symptoms of infiltration during medication         administration, No swelling during administration, No drainage during         administration, Correct patient, time, route, dose and medication         confirmed prior to administration, Patient advised of actions and         side-effects prior to administration, Allergies confirmed and         medications reviewed prior to administration.    : Follow Up : Response assessment performed, No signs or         symptoms of allergic reaction noted, _IV SITE #1:_, IV fluid infusion          discontinued, on Mon May 26, 2011 21:35, Total fluid hydration time         IV site 1 2 hours, ., Total amount infused: . (21:35 TKT1)   Zofran:  Order: Zofran (Ondansetron Hydrochloride) -         Dose: 4 mg : IV Push         Ordered by: Lorel Monaco, PA-C         Entered by: Lorel Monaco, PA-C Mon May 26, 2011 19:17 ,  Acknowledged by: Stefanie Libel May 26, 2011 19:24         Documented as given by: Stefanie Libel May 26, 2011 19:44          Patient, Medication, Dose, Route and Time verified prior to         administration.          Amount given: 4mg , IV SITE #1 into left hand, IV SITE #1 IVP,         initial medication, Rapidly, Connections checked prior to         administration, Line traced prior to administration, Catheter         placement confirmed via flush prior to administration, IV site         without signs or symptoms of infiltration during medication         administration, No swelling during administration, No drainage during         administration, Correct patient, time, route, dose and medication         confirmed prior to administration, Patient advised of actions and         side-effects prior to administration, Allergies confirmed and         medications reviewed prior to administration.    : Follow Up : Response assessment performed, No signs or         symptoms of allergic reaction noted, Decreased nausea, _IV SITE #1:_,         Pt states I have not thrown up.. (21:35 TKT1)       ORDERS   Consult MD for Medication and/or IVF orders:  Ordered for:         Jama Flavors, MD, Raiford Noble         Status: Done by Ephriam Knuckles, RN, Tomie China May 26, 2011 19:12.         (19:09 KPC0)   I-Stat Chem8+ (complete):  Ordered for: Jama Flavors, MD, Raiford Noble         Status: Done by Melodye Ped III, Samer Mon May 26, 2011 19:33. (19:09         KPC0)   Urine dip (send for lab U/A if positive):  Ordered for: Jama Flavors,         MD, Raiford Noble          Status: Done by Melodye Ped III, Samer Mon May 26, 2011 19:37. (19:09         KPC0)   Repeat bedside glucose in 1 hour:  Ordered for: Jama Flavors, MD, Rick         Status: Done by Stefanie Libel May 26, 2011 20:46. (19:09         KPC0)   Blood Glucose:  Ordered for: Jama Flavors, MD, Raiford Noble         Status: Done by Ephriam Knuckles RN, Tomie China May 26, 2011 19:12.         (19:09 KPC0)   NPO except water or diet beverage:  Ordered for: Jama Flavors, MD,         Raiford Noble         Status: Done by Ephriam Knuckles RN, Tomie China May 26, 2011 19:12.         (19:09 KPC0)   IV- Saline Lock:  Ordered for: Jama Flavors, MD, Rick         Status: Done by Kathreen Cosier, Samer Mon May 26, 2011 19:33. (19:09         KPC0)   LIPASE:  Ordered for: Delton See,  MD, Tim         Status: Done by System Mon May 26, 2011 20:13. (19:12 EJS1)   COMPREHENSIVE METABOLIC PANEL:  Ordered for: Delton See, MD, Tim         Status: Done by System Mon May 26, 2011 20:19. (19:12 EJS1)   CBC, AUTOMATED DIFFERENTIAL:  Ordered for: Delton See, MD, Tim         Status: Done by System Mon May 26, 2011 20:21. (19:12 EJS1)   BLOOD GAS:  Ordered for: Delton See, MD, Tim         Status: Active. (19:12 EJS1)   ACETONE QUAL:  Ordered for: Delton See, MD, Tim         Status: Done by System Mon May 26, 2011 20:07. (19:12 EJS1)   Urine HCG:  Ordered for: Delton See, MD, Tim         Status: Done by Melodye Ped III, Samer Mon May 26, 2011 19:37. (19:12         EJS1)   Insulin infusion per Standard Order Set:  Ordered for: Delton See,         MD, Tim         Status: Done by Stefanie Libel May 26, 2011 20:46. (19:13         EJS1)   hold insulin infusion until DKA is confirmed:  Ordered for:         Delton See, MD, Tim         Status: Done by Stefanie Libel May 26, 2011 20:45. (19:18         ONG2)   12 LEAD EKG:  Ordered for: Delton See, MD, Tim         Status: Active. (19:29 TSN)   start IV insulin infusion:  Ordered for: Delton See, MD, Tim         Status: Done by Stefanie Libel May 26, 2011 20:46. (19:46          EJS1)   PO Fluid [place]:  Ordered for: Delton See, MD, Tim         Status: Done by Stefanie Libel May 26, 2011 22:28. (22:00         TSN)   [facility] Hospitalist for admission:  Ordered for: Delton See, MD,         Tim         Status: Done by Saintclair Halsted May 26, 2011 23:09. (23:05         TSN)   HOSPITAL OBSERVATION diabetes floor for Dr Maryelizabeth Kaufmann [facility] Hospitalist):          Ordered for: Delton See, MD, Tim         Status: Done by Saintclair Halsted May 26, 2011 23:21. (23:17         TSN)   IV Set - Primary Tubing:  Ordered for: Delton See, MD, Tim         Status: Active. (Tue May 27, 2011 00:36 TKT1)   IV Pump - Dual:  Ordered for: Delton See, MD, Tim         Status: Active. (Tue May 27, 2011 00:36 TKT1)   BP Cuff Adult Regular:  Ordered for: Delton See, MD, Tim         Status: Active. (Tue May 27, 2011 00:36 TKT1)   IV Start kit:  Ordered for: Delton See, MD, Tim         Status: Active. (Tue May 27, 2011 00:36 TKT1)   Elita Boone IV Cath:  Ordered for: Delton See, MD,  Tim         Status: Active. (Tue May 27, 2011 00:36 TKT1)   IV Start kit:  Ordered for: Delton See, MD, Tim         Status: Active. (Tue May 27, 2011 00:36 TKT1)   IV Set - Primary Tubing:  Ordered for: Delton See, MD, Tim         Status: Active. (Tue May 27, 2011 00:36 TKT1)   Elita Boone IV Cath:  Ordered for: Delton See, MD, Tim         Status: Active. (Tue May 27, 2011 00:36 TKT1)       NURSING ASSESSMENT: ABDOMEN (19:10 KPC0)   CONSTITUTIONAL: Complex assessment performed, History obtained         from patient, Patient arrives, via hospital wheelchair, Gait steady,         Patient appears, in distress due to pain, Patient cooperative,         Patient alert, Oriented to person, place and time, Skin warm, Skin         dry, Skin normal in color, Mucous membranes pink, Mucous membranes         moist.   PAIN: aching pain, to the epigastric region, Onset of pain This         morning, constant, on a scale 0-10 patient rates pain as 9.    ABDOMEN: Abdomen assessment findings include abdomen symmetrical,         Abdomen soft, tender, to the epigastric region, no pulsatile mass,         Associated with nausea, Associated with vomiting, no associated         diarrhea.       NURSING PROCEDURE: ADMISSION   ADMISSION: Patient admitted to [facility] unit, room number 5226, Report         faxed to unit, Fax receipt confirmed, by Rose/Sandy, RN, Bed assigned         at [phone], Transported via cart/stretcher, Accompanied by paramedic,         Transported with disposable blood pressure cuff, Transported with IV         medication(s) infusion, Transported with saline lock. (Tue May 27, 2011 00:34 TKT1)   BELONGINGS: Belongings and valuables with patient at time of         admission include:, pants, Description black, shirt, Description         white, shoes, Description black and purple, purse, Description black,         Belongings remain with patient, Valuables remain with patient. (Tue         May 27, 2011 00:29 TKT1)       NURSING PROCEDURE: COMMUNICATIONS (20:13 MAA2)   COMMUNICATIONS: Critical lab value, received at 2013, received         from [facility], Critical lab result: [id number], given to         DR.NELSON, results read back and verified, orders received, read back         and verified.       NURSING PROCEDURE: EKG CHART (19:40 LSG0)   PATIENT IDENTIFIER: Patient's identity verified by patient         stating name, Patient's identity verified by patient stating birth         date, Patient's identity verified by hospital ID bracelet.   EKG: EKG indicated for generalized abdominal pain, 12 lead EKG         performed on the  left chest, done by l gadkowski, first EKG.   FOLLOW-UP: After procedure, EKG for interpretation given to Dr.         Delton See, EKG was given to Dr. at [id number].   NOTES: Patient tolerated procedure well.   SAFETY: Side rails up, Cart/Stretcher in lowest position, Call         light within reach, Hospital ID band on.        NURSING PROCEDURE: IV   PATIENT IDENITIFIER: Patient's identity verified by patient         stating name, Patient's identity verified by patient stating birth         date, Patient's identity verified by hospital ID bracelet, Patient         actively involved in identification process. (19:34 SS1)     Patient's identity verified by patient stating name, Patient's identity         verified by patient stating birth date. (21:33 TKT1)   IV SITE 1: IV established, to the left hand, using a 22 gauge         catheter, in one attempt, IV site prepped with iv start kit, Saline         lock established, Flushed with normal saline (mls): 10, Labs drawn at         time of placement, labeled in the presence of the patient and sent to         lab, Tourniquet removed from patient after procedure., Labs labeled         in the presence of the patient and then sent to the Lab. (19:34         SS1)   FOLLOW-UP SITE 1: After procedure, sterile transparent dressing         applied, After procedure, no drainage at IV site, After procedure, no         swelling at IV site, After procedure, no redness at IV site. (19:34         SS1)   IV SITE 2: IV therapy indicated for hydration, IV therapy         indicated for medication administration, IV established, to right         upper arm, using a 22 gauge catheter, Flushed with normal saline         (mls): 10ml. (21:33 TKT1)   FOLLOW-UP SITE 2: After procedure, sterile transparent dressing         applied. (21:33 TKT1)   SAFETY: Side rails up, Cart/Stretcher in lowest position, Call         light within reach, Hospital ID band on. (19:34 SS1)       NURSING PROCEDURE: NURSE NOTES   NURSES NOTES: Report received, to [place], Charity fundraiser, for shift change.         (19:23 TKT1)     Report received, to from [place], [place], for shift change. (19:25 TKT1)     Notes: Pt awake/alert resting on gurney. Pt c/o pain 9/10 in stomach at         this time. (19:38 TKT1)      Notes: Pt alert/awake resting on gurney. (20:46 TKT1)     Patient assisted to bathroom with steady gait. (21:25 SS1)     Notes: Pt given a cup of ice water to drink as PO challenge. (22:31         TKT1)   VITAL SIGNS: Pain: 8. (20:46 TKT1)       DIAGNOSIS (23:16 TSN)  FINAL: PRIMARY: early DKA, ADDITIONAL: Abdominal pain -         epigastric, Hyperglycemia, Nausea and vomiting.       DISPOSITION   PATIENT:  Disposition Type: Observation, Disposition: Hospital         Observation Regular Bed, Condition: Treatment in Progress. (23:16         TSN)      Patient left the department. (Tue May 27, 2011 00:40 TKT1)       VITAL SIGNS   VITAL SIGNS: BP: 147/86 (Sitting), Pulse: 89, Resp: 16, Temp:         97.9 (Oral), Pain: 9, O2 sat: 100 on Room air, Time: 05/26/2011 18:51.         (18:51 KLH1)     Pain: 8, Time: 05/26/2011 20:46. (20:46 TKT1)     Pain: 8, Time: 05/26/2011 22:23. (22:23 TKT1)     BP: 146/74 (Sitting), Pulse: 109, Resp: 18, Temp: 98.6, Pain: 8, O2 sat:         100 on Room air, Time: 05/26/2011 23:18. (23:18 TKT1)       PRESCRIPTION     No recorded prescriptions   Key:     EJS1=Salch, PA-C, Lonzo Cloud  KLH1=Heath, RN, Clydie Braun  KPC0=Christian, RN,     Natalia Leatherwood     LSG0=Gadkowski,PM, Misty Stanley  MAA2=Alcos, RN, MSN, Mariana Kaufman  SS1=Saad, ACT III,     Samer     TKT1=Thomas, Tanyell  TSN=Nelson, MD, Jorja Loa

## 2011-05-26 NOTE — Procedures (Signed)
Test Reason : Abdominal pain-general   Blood Pressure : ***/*** mmHG   Vent. Rate : 088 BPM     Atrial Rate : 088 BPM      P-R Int : 120 ms          QRS Dur : 072 ms       QT Int : 384 ms       P-R-T Axes : 079 071 069 degrees      QTc Int : 464 ms   Normal sinus rhythm with sinus arrhythmia   Possible Left atrial enlargement   Nonspecific ST and T wave abnormality   When compared with ECG of 19-Mar-2011 12:16,   T wave amplitude has increased in Anterior leads   The rate has slowed   Confirmed by [place], M.D., Jesse ([id number]) on 05/27/2011 4:19:26 PM   Referred By:             Overread By: Jesse St. Clair, M.D.

## 2011-07-31 NOTE — ED Provider Notes (Signed)
MEDICATION ADMINISTRATION SUMMARY              Drug Name: Ativan, Dose Ordered: 1 mg, Route: IV Push, Status: Given,         Time: 17:07 07/31/2011,          Drug Name: Pepcid, Dose Ordered: 20 mg, Route: IV Push, Status:         Given, Time: 14:03 07/31/2011,          Drug Name: Zofran, Dose Ordered: 4 mg, Route: IV Push, Status: Given,         Time: 14:02 07/31/2011,          Drug Name: *Sodium Chloride 0.9%, Intravenous, Dose Ordered: 1000 mL,         Route: IV Fluid, Status: Given, Time: 13:30 07/31/2011,          Drug Name: Reglan, Dose Ordered: 10 mg, Route: IV Push, Status:         Given, Time: 12:47 07/31/2011,          Drug Name: *Sodium Chloride 0.9%, Intravenous, Dose Ordered: 1000 mL,         Route: IV Fluid, Status: Given, Time: 12:30 07/31/2011,          Drug Name: DiphenhydrAMINE Hydrochloride, Dose Ordered: 25 mg, Route:         IV Push, Status: Given, Time: 12:27 07/31/2011, *Additional information         available in notes, Detailed record available in Medication Service         section.       KNOWN ALLERGIES   Penicillins       TRIAGE Morey Hummingbird Jul 31, 2011 12:07 LDD0)   PATIENT: NAME: [name] [name], AGE: 26, GENDER: female, DOB: [birth year]         1985/11/14, TIME OF GREET: Thu Jul 31, 2011 12:06 by Annamary Carolin, RN,         LANGUAGE: [place]. Morey Hummingbird Jul 31, 2011 12:07 LDD0)   ADMISSION: URGENCY: 3, TRANSPORT: Ambulatory, DEPT: Emergency,         BED: 2ED 20Iso. (Thu Jul 31, 2011 12:07 LDD0)   VITAL SIGNS: BP 151/100, (Sitting), Pulse 105, Resp 20, Temp 98,         (Oral), Pain 9, O2 Sat 99, on Room air, Time 07/31/2011 12:06. (12:06         LDD0)   COMPLAINT:  Abd Pain, [place]. Morey Hummingbird Jul 31, 2011 12:07 LDD0)   PRESENTING COMPLAINT:  awoke this am w/ vomiting, elevated         glucose, epigastric pain. (12:14 MAS7)   PAIN: Patient complains of pain, Pain described as burning, Pain         described as sharp, On a scale 0-10 patient rates pain as 9, worse w/         emesis, Pain is constant. (12:14 MAS7)    LMP: Pt on birth control. (12:14 MAS7)   TB SCREENING: TB screen negative for this patient. (12:14         MAS7)   ABUSE SCREENING: Patient denies physical abuse or threats. (12:14         MAS7)   FALL RISK: Patient has a low risk of falling. (12:14 MAS7)   SUICIDAL IDEATION: Suicidal ideation is not present. (12:14         MAS7)   ADVANCE DIRECTIVES: Patient does not have advance directives.         (12:14 MAS7)   PROVIDERS:  TRIAGE NURSE: Annamary Carolin, RN. Morey Hummingbird Jul 31, 2011 12:07         LDD0)       PRESENTING PROBLEM Morey Hummingbird Jul 31, 2011 12:07 LDD0)      Presenting problems: Vomiting, Abdominal Pain.       GREET   NOTES: Patient arrived ambulatory. Morey Hummingbird Jul 31, 2011 12:07         LDD0)   Marshia Ly: Greet: Thu Jul 31, 2011 12:06. (12:06 LDD0)       CURRENT MEDICATIONS   Humalog:  SSC See Notes. insulin pump - titrate according to         glucose levels. (12:14 MAS7)   Reglan:  10 mg Oral 4 times a day. (12:15 MAS7)       MEDICATION SERVICE   Ativan:  Order: Ativan (Lorazepam) - Dose: 1 mg : IV         Push         Ordered by: Blanchard Mane, PA-C         Entered by: Blanchard Mane, PA-C Thu Jul 31, 2011 14:25 ,          Acknowledged by: Dallas Breeding), RN Thu Jul 31, 2011 17:04         Documented as given by: Francesco Runner Dewayne Hatch), RN Thu Jul 31, 2011         17:07          Patient, Medication, Dose, Route and Time verified prior to         administration.          IV SITE #1 into left antecubital, IV SITE #1 IVP, subsequent         different medication, Slowly, Connections checked prior to         administration, Line traced prior to administration, Catheter         placement confirmed via flush prior to administration, IV site         without signs or symptoms of infiltration during medication         administration, No swelling during administration, No drainage during         administration, IV flushed after administration, Correct patient,          time, route, dose and medication confirmed prior to administration,         Patient advised of actions and side-effects prior to administration,         Allergies confirmed and medications reviewed prior to administration,         Patient in position of comfort, Side rails up, Cart in lowest         position, Call light in reach.   DiphenhydrAMINE Hydrochloride:  Order: DiphenhydrAMINE         Hydrochloride (Diphenhydramine Hydrochloride) - Dose: 25 mg         : IV Push         Ordered by: Blanchard Mane, PA-C         Entered by: Blanchard Mane, PA-C Thu Jul 31, 2011 12:25 ,          Acknowledged by: Dallas Breeding), RN Thu Jul 31, 2011 12:32         Documented as given by: Francesco Runner Dewayne Hatch), RN Thu Jul 31, 2011         12:27          Patient, Medication, Dose, Route and Time verified prior to  administration.          IV SITE #1 into left antecubital, IV SITE #1 IVP, initial         medication, Slowly, Connections checked prior to administration, Line         traced prior to administration, Catheter placement confirmed via         flush prior to administration, IV site without signs or symptoms of         infiltration during medication administration, No swelling during         administration, No drainage during administration, IV flushed after         administration, Correct patient, time, route, dose and medication         confirmed prior to administration, Patient advised of actions and         side-effects prior to administration, Allergies confirmed and         medications reviewed prior to administration, Patient in position of         comfort, Side rails up, Cart in lowest position, Call light in reach.   Pepcid:  Order: Pepcid (Famotidine) - Dose: 20 mg : IV         Push         Ordered by: Blanchard Mane, PA-C         Entered by: Blanchard Mane, PA-C Thu Jul 31, 2011 13:48 ,          Acknowledged by: Dallas Breeding), RN Thu Jul 31, 2011 13:50          Documented as given by: Francesco Runner Dewayne Hatch), RN Thu Jul 31, 2011         14:03          Patient, Medication, Dose, Route and Time verified prior to         administration.          IV SITE #1 into left antecubital, IV SITE #1 IVPB or drip, initial         infusion, IVPB mixed in: 50ml, Fluid: 0.9NS, via secondary tubing,         Connections checked prior to administration, Line traced prior to         administration, Catheter placement confirmed via flush prior to         administration, IV site without signs or symptoms of infiltration         during medication administration, No swelling during administration,         No drainage during administration, IV flushed after administration,         Correct patient, time, route, dose and medication confirmed prior to         administration, Patient advised of actions and side-effects prior to         administration, Allergies confirmed and medications reviewed prior to         administration, Patient in position of comfort, Side rails up, Cart         in lowest position, Family at bedside, Call light in reach.    : Follow Up : _IV SITE #1:_, Medication infusion discontinued,         on Thu Jul 31, 2011 14:10, 10 minutes, ., Total amount infused: 50.         (18:09 MAS7)   Reglan:  Order: Reglan (Metoclopramide Hydrochloride) -         Dose: 10 mg : IV Push  Ordered by: Blanchard Mane, PA-C         Entered by: Blanchard Mane, PA-C Thu Jul 31, 2011 12:25 ,          Acknowledged by: Dallas Breeding), RN Thu Jul 31, 2011 12:32         Documented as given by: Francesco Runner Dewayne Hatch), RN Thu Jul 31, 2011         12:47          Patient, Medication, Dose, Route and Time verified prior to         administration.          IV SITE #1 into left antecubital, IV SITE #1 IVP, subsequent         different medication, Slowly, Connections checked prior to         administration, Line traced prior to administration, Catheter          placement confirmed via flush prior to administration, IV site         without signs or symptoms of infiltration during medication         administration, No swelling during administration, No drainage during         administration, IV flushed after administration, Correct patient,         time, route, dose and medication confirmed prior to administration,         Patient advised of actions and side-effects prior to administration,         Allergies confirmed and medications reviewed prior to administration,         Patient in position of comfort, Side rails up, Cart in lowest         position, Call light in reach.   Sodium Chloride 0.9%, Intravenous:  Order: Sodium Chloride 0.9%,         Intravenous (Sodium Chloride) - Dose: 1000 mL : IV Fluid         Notes: For Hydration         Ordered by: Blanchard Mane, PA-C         Entered by: Blanchard Mane, PA-C Thu Jul 31, 2011 12:25          Documented as given by: Francesco Runner Dewayne Hatch), RN Thu Jul 31, 2011         12:30          Patient, Medication, Dose, Route and Time verified prior to         administration.         IV SITE #1, IV SITE #1 IV fluids established, amount 1 Liter, IV SITE         #1 bolus of 1000 ml established, IV SITE #1 Rate of bolus, wide open,         via dial-a-flow tubing, Connections checked prior to administration,         Line traced prior to administration, Catheter placement confirmed via         flush prior to administration, IV site without signs or symptoms of         infiltration during medication administration, No swelling during         administration, No drainage during administration, IV flushed after         administration, Correct patient, time, route, dose and medication         confirmed prior to administration, Patient advised of actions and         side-effects prior to administration, Allergies confirmed and  medications reviewed prior to administration, Patient in position of          comfort, Side rails up, Cart in lowest position, Family at bedside,         Call light in reach.    : Follow Up : _IV SITE #1:_, IV fluid infusion discontinued, on         Thu Jul 31, 2011 13:08, 40 minutes, ., Total amount infused: 1000.         (18:08 MAS7)   Sodium Chloride 0.9%, Intravenous:  Order: Sodium Chloride 0.9%,         Intravenous (Sodium Chloride) - Dose: 1000 mL : IV Fluid         Notes: For Hydration         Ordered by: Lucita Ferrara, MD         Entered by: Francesco Runner Dewayne Hatch), RN Thu Jul 31, 2011 13:48          Documented as given by: Francesco Runner Dewayne Hatch), RN Thu Jul 31, 2011         13:30          Patient, Medication, Dose, Route and Time verified prior to         administration.          IV SITE #1 into left antecubital, IV SITE #1 IV fluids established,         IV SITE #1 2nd bag hung, amount 1 Liter, IV SITE #1 bolus of 1000 ml         established, IV SITE #1 Rate of bolus, wide open, Connections checked         prior to administration, Line traced prior to administration,         Catheter placement confirmed via flush prior to administration, IV         site without signs or symptoms of infiltration during medication         administration, No swelling during administration, No drainage during         administration, IV flushed after administration, Correct patient,         time, route, dose and medication confirmed prior to administration,         Patient advised of actions and side-effects prior to administration,         Allergies confirmed and medications reviewed prior to administration,         Patient in position of comfort, Side rails up, Cart in lowest         position, Call light in reach.    : Follow Up : _IV SITE #1:_, IV fluid infusion discontinued, on         Thu Jul 31, 2011 14:30, Total fluid hydration time IV site 1 1 hour,         ., Total amount infused: 1000. (18:09 MAS7)   Zofran:  Order: Zofran (Ondansetron Hydrochloride) -         Dose: 4 mg : IV Push          Ordered by: Blanchard Mane, PA-C         Entered by: Blanchard Mane, PA-C Thu Jul 31, 2011 13:49 ,          Acknowledged by: Dallas Breeding), RN Thu Jul 31, 2011 13:49         Documented as given by: Francesco Runner Dewayne Hatch), RN Thu Jul 31, 2011         14:02  Patient, Medication, Dose, Route and Time verified prior to         administration.          IV SITE #1 into left antecubital, IV SITE #1 IVP, subsequent         different medication, Slowly, Connections checked prior to         administration, Line traced prior to administration, Catheter         placement confirmed via flush prior to administration, IV site         without signs or symptoms of infiltration during medication         administration, No swelling during administration, No drainage during         administration, IV flushed after administration, Correct patient,         time, route, dose and medication confirmed prior to administration,         Patient advised of actions and side-effects prior to administration,         Allergies confirmed and medications reviewed prior to administration,         Patient in position of comfort, Side rails up, Cart in lowest         position, Call light in reach.       ORDERS   Urine dip (send for lab U/A if positive):  Ordered for: Clydene Pugh,         MD, Nedra Hai         Status: Done by Cyndie Chime Samson Frederic) Thu Jul 31, 2011         14:26. (12:25 JAWI)   Urine HCG:  Ordered for: Clydene Pugh, MD, Nedra Hai         Status: Done by Roque Cash) Thu Jul 31, 2011         14:28. (12:25 JAWI)   LIPASE:  Ordered for: Clydene Pugh, MD, Lee         Status: Done by System Thu Jul 31, 2011 12:53. (12:25 JAWI)   CBC, AUTOMATED DIFFERENTIAL:  Ordered for: Clydene Pugh, MD, Nedra Hai         Status: Done by System Thu Jul 31, 2011 12:54. (12:25 JAWI)   COMPREHENSIVE METABOLIC PANEL:  Ordered for: Clydene Pugh, MD, Nedra Hai         Status: Done by System Thu Jul 31, 2011 12:55. (12:25 JAWI)    IV- Saline Lock:  Ordered for: Clydene Pugh, MD, Nedra Hai         Status: Done by Roque Cash) Thu Jul 31, 2011         12:28. (12:25 JAWI)   REGULAR for Dr Terrace ArabiaLenna Gilford for: Clydene Pugh, MD, Nedra Hai         Status: Done by Colon, Wende Mott Jul 31, 2011 16:42. (16:40         LLW1)       NURSING ASSESSMENT: ABDOMEN (12:15 MAS7)   CONSTITUTIONAL: History obtained from patient, Patient arrives         ambulatory, Gait steady, Patient appears comfortable, Patient         cooperative, Patient alert, Oriented to person, place and time, Skin         warm, Skin dry, Skin normal in color, Mucous membranes pink, Mucous         membranes moist, Patient, Patient complains of abd pain vomiting         hyperglycemia, per pt feels like pain from gastroparesis.   PAIN: burning pain, sharp pain, to the epigastric  region,         constant, on a scale 0-10 patient rates pain as 9.   ABDOMEN: Abdomen assessment findings include abdomen symmetrical,         Abdomen soft, tender, to the epigastric region, Associated with         vomiting, currently, Number of times: 6.       NURSING PROCEDURE: DISCHARGE NOTE (18:06 MAS7)   DISCHARGE: Patient discharged to home, in a wheelchair, family         driving, accompanied by parent, Summary of Care printed/ provided,         Discharge instructions given to patient, Discharge instructions given         to mother, Simple or moderate discharge teaching performed,         Prescriptions given and instructions on side effects given, Name of         prescription(s) given: reglan and zofran, Above person(s) verbalized         understanding of discharge instructions and follow-up care, Patient         instructed not to drive home.       NURSING PROCEDURE: IV (12:43 ENL5)   PATIENT IDENITIFIER: Patient's identity verified by patient         stating name, Patient's identity verified by patient stating birth         date, Patient's identity verified by hospital ID bracelet, Patient          actively involved in identification process.   IV SITE 1: IV established, to the left antecubital, using a 20         gauge catheter, in one attempt, Flushed with normal saline (mls):         10ml, Labs drawn at time of placement, labeled in the presence of the         patient and sent to lab, Labs drawn at 1215, Tourniquet removed from         patient after procedure., Labs labeled in the presence of the patient         and then sent to the Lab.   NOTES: Patient tolerated procedure well.   SAFETY: Side rails up, Cart/Stretcher in lowest position, Call         light within reach, Hospital ID band on.       NURSING PROCEDURE: NURSE NOTES   NURSES NOTES: Notes: water given to the pt. nurse Mannie Stabile         made aware. (16:16 ENL5)     Notes: took a sip of water. Pt minimally compliant w/ po fluid challenge.         (16:10 MAS7)     Notes: pt vomiting again, bilious fluid. Pt tolerated a few sips of         gingerale. Is awaitng transportation. (17:00 MAS7)   VITAL SIGNS: BP: 148, / 86, Pulse: 97, Resp: 18, O2 sat: 99%, on         On Vent. (17:00 MAS7)       DIAGNOSIS (16:39 LLW1)   FINAL: PRIMARY: VOMITING AND ABDOMINAL PAIN, ADDITIONAL:         DIABETES, Gastroparesis.       DISPOSITION   PATIENT:  Disposition Type: Discharged, Disposition: Discharge,         Condition: Satisfactory. (16:39 LLW1)      Patient left the department. (18:11 MAS7)       VITAL SIGNS   VITAL SIGNS:  BP: 151/100 (Sitting), Pulse: 105, Resp: 20, Temp:         98 (Oral), Pain: 9, O2 sat: 99 on Room air, Time: 07/31/2011 12:06.         (12:06 LDD0)     BP: 155/88, Pulse: 97, Resp: 16, Pain: 5, O2 sat: 100% on Room air, Time:         07/31/2011 14:10. (14:10 MAS7)     BP: 148/86, Pulse: 97, Resp: 18, O2 sat: 99% on On Vent, Time: 07/31/2011         17:00. (17:00 MAS7)       INSTRUCTION (14:53 JAWI)   DISCHARGE:  VOMITING (NAUSEA, THROWING UP), GASTROPARESIS-(CGH).   SPECIAL:  Clear liquids today and may advance diet as tolereated          tomorrow.         Call your doctor tomorrow morning for follow up.         Take your meds as they are prescribed         Return to the ER if condition worsens or new symptoms develop.       PRESCRIPTION   Zofran:  Tablet : 4 Mg : Oral : Quantity: *** 1 *** Unit: tab(s)         Route: Oral Schedule: 4 times a day Dispense: *** 12 ***. (14:51         JAWI)   NOTES:  No refills          May use generic. (14:51 JAWI)   Reglan:  Tablet : 10 mg : Oral : Quantity: *** 1 *** Unit: 10mg          Route: Oral Schedule: As needed every 6 hours Dispense: *** 20 ***.         (16:49 LLW1)   NOTES:  No refills          May use generic. (16:49 LLW1)   Key:     ENL5=Laguerta, ACTIII, Erin Hearing)  JAWI=Witzenfeld, PA-C, Ree Kida      LDD0=David, RN, Lyn     LLW1=Woodard, MD, Nedra Hai  MAS7=Stevens (Dewayne Hatch), RN, Claris Che

## 2011-08-01 NOTE — ED Provider Notes (Signed)
MEDICATION ADMINISTRATION SUMMARY              Drug Name: HumaLOG, Dose Ordered: * Per Protocol, Route:         Subcutaneous, Status: Given, Time: 00:07 08/02/2011,          Drug Name: *Sodium Chloride 0.9%, Intravenous, Dose Ordered: 1000 mL,         Route: IV Fluid, Status: Given, Time: 00:07 08/02/2011,          Drug Name: Zofran, Dose Ordered: 4 mg, Route: IV Push, Status: Given,         Time: 23:50 08/01/2011,          Drug Name: Pepcid, Dose Ordered: 20 mg, Route: IV Push, Status:         Given, Time: 21:58 08/01/2011,          Drug Name: Reglan, Dose Ordered: 10 mg, Route: IV Push, Status:         Given, Time: 21:50 08/01/2011,          Drug Name: DiphenhydrAMINE Hydrochloride, Dose Ordered: 50 mg, Route:         IV Push, Status: Given, Time: 20:01 08/01/2011,          Drug Name: *Sodium Chloride 0.9%, Intravenous, Dose Ordered: 1000 mL,         Route: IV Fluid, Status: Given, Time: 19:50 08/01/2011,          Drug Name: Phenergan, Dose Ordered: 25 mg, Route: Intramuscular,         Status: Given, Time: 19:30 08/01/2011, *Additional information         available in notes, Detailed record available in Medication Service         section.       KNOWN ALLERGIES   Penicillins: Reaction: Hives       TRIAGE Caleen Essex Aug 01, 2011 19:09 LDD0)   PATIENT: NAME: [name] [name], AGE: 26, GENDER: female, DOB: Wed         03-26-1985, TIME OF GREET: Fri Aug 01, 2011 19:07 by Annamary Carolin, RN,         LANGUAGE: [place], SSN: [id number]. Caleen Essex Aug 01, 2011 19:09 LDD0)   ADMISSION: URGENCY: 3, TRANSPORT: Wheelchair, DEPT: Emergency,         BED: 2ED 21Iso. Caleen Essex Aug 01, 2011 19:09 LDD0)   COMPLAINT:  Abd Pain NV. Caleen Essex Aug 01, 2011 19:09 LDD0)   PRESENTING COMPLAINT:  c/o abd pain 3 days ago constant upper         abd/ +vomiting. +nausea, seen here yesterday for same s/s. (19:19         SRW1)   PAIN: Patient complains of pain, Patient unable to relate pain to         scale, It just hurts real bad, Pain is constant. (19:19 SRW1)    LMP: LMP: Not Applicable. (19:19 SRW1)   TB SCREENING: TB screen not applicable for this patient. (19:19         SRW1)   ABUSE SCREENING: Patient denies physical abuse or threats. (19:19         SRW1)   FALL RISK: Fall risk assessment not applicable to this patient.         (19:19 SRW1)   SUICIDAL IDEATION: Not Applicable. (19:19 SRW1)   ADVANCE DIRECTIVES: Patient does not have advance directives.         (19:19 SRW1)   PROVIDERS: TRIAGE NURSE: Annamary Carolin, RN. Caleen Essex Aug 01, 2011 19:09  LDD0)       PRESENTING PROBLEM (19:09 LDD0)      Presenting problems: Abdominal Pain, Vomiting.       GREET   NOTES: Patient arrive via wheel chair. Caleen Essex Aug 01, 2011 19:09         LDD0)   Marshia Ly: Greet: Fri Aug 01, 2011 19:07. (19:07 LDD0)       CURRENT MEDICATIONS (19:20 SRW1)   Humalog:  SSC See Notes. insulin pump - titrate according to         glucose levels.   Zofran:  1 tab(s) Oral 4 times a day. x 4 Mg - Oral - QID.   Reglan:  10 mg Oral 4 times a day.       MEDICATION SERVICE   DiphenhydrAMINE Hydrochloride:  Order: DiphenhydrAMINE         Hydrochloride (Diphenhydramine Hydrochloride) - Dose: 50 mg         : IV Push         Ordered by: Sharma Covert, PA-C         Entered by: Sharma Covert, PA-C Fri Aug 01, 2011 19:30 ,          Acknowledged by: Jarome Lamas, RN Caleen Essex Aug 01, 2011 19:45         Documented as given by: Jarome Lamas, RN Caleen Essex Aug 01, 2011 20:01          Patient, Medication, Dose, Route and Time verified prior to         administration.          Amount given: 50mg , IV SITE #1 into left antecubital, IV SITE #1         IVP, initial medication, Slowly, Connections checked prior to         administration, Line traced prior to administration, Catheter         placement confirmed via flush prior to administration, IV site         without signs or symptoms of infiltration during medication         administration, No swelling during administration, No drainage during          administration, IV flushed after administration, Correct patient,         time, route, dose and medication confirmed prior to administration,         Patient advised of actions and side-effects prior to administration,         Allergies confirmed and medications reviewed prior to administration,         Administered by s.wertman RN, Patient in position of comfort, Side         rails up, Cart in lowest position.    : Follow Up : Response assessment performed, No signs or         symptoms of allergic reaction noted, Decreased pain, _IV SITE #1:_.         (21:08 SRW1)   HumaLOG:  Order: HumaLOG (Insulin Lispro) - Dose: * Per         Protocol : Subcutaneous         Ordered by: Haze Justin, MD         Entered by: Haze Justin, MD Caleen Essex Aug 01, 2011 23:51 ,          Acknowledged by: Jarome Lamas, RN Caleen Essex Aug 01, 2011 23:54         Documented as given by: Jarome Lamas, RN Sat Aug 02, 2011 00:07  Patient, Medication, Dose, Route and Time verified prior to         administration.          Time given: 0007, Amount given: 5 units per protocol, Medication         administered to right upper arm, Correct patient, time, route, dose         and medication confirmed prior to administration, Patient advised of         actions and side-effects prior to administration, Allergies confirmed         and medications reviewed prior to administration, Administered by         s.wertman rN, Advised not to ambulate without assistance, Patient in         position of comfort, Side rails up, Cart in lowest position,          Co-signed by: Randon Goldsmith, LPN Sat Aug 02, 2011 00:17.   Pepcid:  Order: Pepcid (Famotidine) - Dose: 20 mg : IV         Push         Ordered by: Sharma Covert, PA-C         Entered by: Sharma Covert, PA-C Fri Aug 01, 2011 21:40 ,          Acknowledged by: Jarome Lamas, RN Caleen Essex Aug 01, 2011 21:48         Documented as given by: Jarome Lamas, RN Caleen Essex Aug 01, 2011 21:58           Patient, Medication, Dose, Route and Time verified prior to         administration.          Amount given: 20mg , IV SITE #1 into left antecubital, IV SITE #1         IVP, subsequent different medication, Slowly, Connections checked         prior to administration, Line traced prior to administration,         Catheter placement confirmed via flush prior to administration, IV         site without signs or symptoms of infiltration during medication         administration, No swelling during administration, No drainage during         administration, IV flushed after administration, Correct patient,         time, route, dose and medication confirmed prior to administration,         Patient advised of actions and side-effects prior to administration,         Allergies confirmed and medications reviewed prior to administration,         Administered by s.wertman RN, Patient in position of comfort, Side         rails up, Cart in lowest position.   Phenergan:  Order: Phenergan (Promethazine Hydrochloride) -         Dose: 25 mg : Intramuscular         POTENTIAL SEVERE INTERACTION: Reglan - Low risk interaction         Ordered by: Sharma Covert, PA-C         Entered by: Sharma Covert, PA-C Fri Aug 01, 2011 19:31 ,          Acknowledged by: Jarome Lamas, RN Caleen Essex Aug 01, 2011 19:45         Documented as given by: Jarome Lamas, RN Caleen Essex Aug 01, 2011 19:30          Patient, Medication, Dose,  Route and Time verified prior to         administration.         IM medication, Amount given: 25mg , Medication administered to right         deltoid, Given Z-Track, Administered by s.wertman rN, Patient in         position of comfort, Side rails up, Cart in lowest position.    : Follow Up : Response assessment performed, No signs or         symptoms of allergic reaction noted, Decreased pain. (21:08         SRW1)   Reglan:  Order: Reglan (Metoclopramide Hydrochloride) -         Dose: 10 mg : IV Push          POTENTIAL SEVERE INTERACTION: Phenergan (Promethazine Hydrochloride)         - Low risk interaction         Ordered by: Sharma Covert, PA-C         Entered by: Sharma Covert, PA-C Fri Aug 01, 2011 21:40 ,          Acknowledged by: Jarome Lamas, RN Caleen Essex Aug 01, 2011 21:48         Documented as given by: Jarome Lamas, RN Caleen Essex Aug 01, 2011 21:50          Patient, Medication, Dose, Route and Time verified prior to         administration.          Amount given: 10mg , IV SITE #1 into left antecubital, IV SITE #1         IVP, subsequent different medication, Slowly, Connections checked         prior to administration, Line traced prior to administration,         Catheter placement confirmed via flush prior to administration, IV         site without signs or symptoms of infiltration during medication         administration, No swelling during administration, No drainage during         administration, IV flushed after administration, Correct patient,         time, route, dose and medication confirmed prior to administration,         Patient advised of actions and side-effects prior to administration,         Allergies confirmed and medications reviewed prior to administration,         Administered by s.wertman RN.   Sodium Chloride 0.9%, Intravenous:  Order: Sodium Chloride 0.9%,         Intravenous (Sodium Chloride) - Dose: 1000 mL : IV Fluid         Notes: For Hydration         Ordered by: Sharma Covert, PA-C         Entered by: Sharma Covert, PA-C Fri Aug 01, 2011 19:30 ,          Acknowledged by: Jarome Lamas, RN Caleen Essex Aug 01, 2011 19:46         Documented as given by: Jarome Lamas, RN Caleen Essex Aug 01, 2011 19:50          Patient, Medication, Dose, Route and Time verified prior to         administration.          Amount given: one liter, IV SITE #1 into left antecubital, IV SITE         #1  IV fluids established, IV SITE #1 1st bag hung, IV SITE #1 bolus          of 1000 ml established, via primary tubing, Connections checked prior         to administration, Line traced prior to administration, Catheter         placement confirmed via flush prior to administration, IV site         without signs or symptoms of infiltration during medication         administration, No swelling during administration, No drainage during         administration, IV flushed after administration, Correct patient,         time, route, dose and medication confirmed prior to administration,         Patient advised of actions and side-effects prior to administration,         Allergies confirmed and medications reviewed prior to administration,         Administered by s.wertman RN, Patient in position of comfort, Side         rails up, Cart in lowest position.    : Follow Up : Response assessment performed, No signs or         symptoms of allergic reaction noted, Decreased pain, _IV SITE #1:_,         IV fluid infusion discontinued, on Fri Aug 01, 2011 20:21, 35         minutes, ., Total amount infused: one liter. (20:30 SRW1)   Sodium Chloride 0.9%, Intravenous:  Order: Sodium Chloride 0.9%,         Intravenous (Sodium Chloride) - Dose: 1000 mL : IV Fluid         Notes: For Hydration         Ordered by: Haze Justin, MD         Entered by: Haze Justin, MD Caleen Essex Aug 01, 2011 23:52 ,          Acknowledged by: Jarome Lamas, RN Caleen Essex Aug 01, 2011 23:53         Documented as given by: Jarome Lamas, RN Sat Aug 02, 2011 00:07          Patient, Medication, Dose, Route and Time verified prior to         administration.          Amount given: one liter, IV SITE #1 into left antecubital, IV SITE         #1 IV fluids established, IV SITE #1 2nd bag hung, IV SITE #1 bolus         of 1000 ml established, via primary tubing, Connections checked prior         to administration, Line traced prior to administration, Catheter         placement confirmed via flush prior to administration, IV site          without signs or symptoms of infiltration during medication         administration, No swelling during administration, No drainage during         administration, IV flushed after administration, Correct patient,         time, route, dose and medication confirmed prior to administration,         Patient advised of actions and side-effects prior to administration,         Allergies confirmed and medications reviewed prior to administration,         Administered  by s.wertman rN, Patient in position of comfort, Side         rails up, Cart in lowest position.    : Follow Up : Response assessment performed, No signs or         symptoms of allergic reaction noted, _IV SITE #1:_, IV fluid infusion         discontinued, on Sat Aug 02, 2011 02:15, Total fluid hydration time         IV site 1 2 hours, 10 minutes, ., Total amount infused: one liter.         (Sat Aug 02, 2011 02:14 SRW1)   Zofran:  Order: Zofran (Ondansetron Hydrochloride) -         Dose: 4 mg : IV Push         Ordered by: Sharma Covert, PA-C         Entered by: Sharma Covert, PA-C Fri Aug 01, 2011 23:23 ,          Acknowledged by: Jarome Lamas, RN Caleen Essex Aug 01, 2011 23:28         Documented as given by: Jarome Lamas, RN Caleen Essex Aug 01, 2011 23:50          Patient, Medication, Dose, Route and Time verified prior to         administration.          Amount given: 4mg , IV SITE #1 into left antecubital, IV SITE #1 IVP,         subsequent different medication, Slowly, Connections checked prior to         administration, Line traced prior to administration, Catheter         placement confirmed via flush prior to administration, IV site         without signs or symptoms of infiltration during medication         administration, No swelling during administration, No drainage during         administration, IV flushed after administration, Correct patient,         time, route, dose and medication confirmed prior to administration,          Patient advised of actions and side-effects prior to administration,         Allergies confirmed and medications reviewed prior to administration,         Administered by s wertman rN, Patient in position of comfort, Side         rails up, Cart in lowest position.       ORDERS   Blood Glucose:  Ordered for: Henrene Hawking, MD, Melvyn Neth         Status: Done by Gwynneth Munson, RN, Bjorn Pippin Aug 01, 2011 19:43. (19:30         KMJ)   COMPREHENSIVE METABOLIC PANEL:  Ordered for: Henrene Hawking, MD, Melvyn Neth         Status: Done by System Fri Aug 01, 2011 20:54. (19:30 KMJ)   IV- Saline Lock:  Ordered for: Henrene Hawking, MD, Lewis         Status: Done by Gwynneth Munson, RN, Bjorn Pippin Aug 01, 2011 19:43. (19:30         KMJ)   CBC, AUTOMATED DIFFERENTIAL:  Ordered for: Henrene Hawking, MD, Melvyn Neth         Status: Done by System Fri Aug 01, 2011 20:54. (19:30 KMJ)   LIPASE:  Ordered for: Henrene Hawking, MD, Melvyn Neth         Status: Done by System Fri Aug 01, 2011 20:53. (19:30 KMJ)   PO fluid challenge:  Ordered for: Henrene Hawking, MD, Lewis         Status: Done by Gwynneth Munson, RN, Bjorn Pippin Aug 01, 2011 23:12. (22:58         KMJ)   [facility] Hospitalist for admission:  Ordered for: Henrene Hawking, MD,         Melvyn Neth         Status: Done by Marcelina Morel, ACT(JOSH), Sanjuana Kava Aug 01, 2011 23:57.         (23:54 LHS0)      Ordered for: Henrene Hawking, MD, Melvyn Neth         Status: Done by Mechele Dawley), [facility] Sat Aug 02, 2011 00:53.         (Sat Aug 02, 2011 00:51 LHS0)   REGULAR for [facility] Hospitalist:  Ordered for: Henrene Hawking, MD, Melvyn Neth         Status: Done by Marcelina Morel, ACT(JOSH), [facility] Sat Aug 02, 2011 01:31.         (Sat Aug 02, 2011 01:13 LHS0)       NURSING ASSESSMENT: ABDOMEN WITH PROCEDURES   CONSTITUTIONAL: History obtained from patient, Patient arrives,         via hospital wheelchair, Unsteady gait, Assistance to cart, Patient         appears, in distress due to pain, Patient cooperative, Patient alert,         Oriented to person, place and time, Skin warm, Skin dry, Skin normal          in color, Mucous membranes pink, Mucous membranes moist, Patient is         well-groomed, Patient complains of abd pain vomiting. (19:24         SRW1)   PAIN: burning pain, diffusely, on a scale 0-10 patient rates pain         as 9, Pain exacerbated by nothing. (19:24 SRW1)   ABDOMEN: Abdomen soft, tender, diffusely, no pulsatile mass,         Associated with nausea, Associated with vomiting, history of         vomiting, Number of times: multiple, having bilious emesis. (19:24         SRW1)   GENITOURINARY FEMALE: Not pregnant, Notes: patient advised         recieved depo shot. (19:24 SRW1)   IV: IV therapy indicated for hydration, IV therapy indicated for         medication administration, IV established, to the right hand, using a         22 gauge catheter, in one attempt, Saline lock established, Flushed         with normal saline (mls): 10, Tourniquet removed from patient after         procedure. (19:24 SRW1)   NOTES: Notes: Patient resting fetal position advised nausea,         improved continues to have diffuse abd pain 7/10. (21:33 SRW1)       NURSING PROCEDURE: ADMISSION (Sat Aug 02, 2011 02:15 SRW1)   ADMISSION: Patient admitted to a medical-surgical unit, room         number 5232, Report faxed to unit, Fax receipt confirmed, by Arline Asp:         RN: Radura.       NURSING PROCEDURE: BEDSIDE TESTING   PATIENT IDENTIFIER: Patient's identity verified by patient         stating name. (Sat Aug 02, 2011 01:23 SRW1)  GLUCOSE: Venous blood sample, Result (mg/dl) 578. (19:36         MCC)     Glucose testing indicated for diabetic patient, Capillary blood sample,         Result (mg/dl) 469. (Sat Aug 02, 2011 01:23 SRW1)   NOTES: Patient tolerated procedure well. (19:36 MCC)   SAFETY: Side rails up, Cart/Stretcher in lowest position, Family         at bedside, Call light within reach, Hospital ID band on. (19:36         MCC)       NURSING PROCEDURE: IV    FOLLOW-UP SITE 1: IV discontinued, due to swelling at site.         (19:26 SRW1)   IV SITE 2: IV therapy indicated for hydration, IV therapy         indicated for medication administration, IV established, to the left         antecubital, using a 20 gauge catheter, in two attempts, IV site         prepped with chloraprep, Flushed with normal saline (mls): 10,         Tourniquet removed from patient after procedure. (20:02 SRW1)   FOLLOW-UP SITE 2: After procedure, sterile transparent dressing         applied, After procedure, no drainage at IV site, After procedure, no         swelling at IV site. (20:02 SRW1)       NURSING PROCEDURE: LAB DRAW (20:05 [facility])   PATIENT IDENTIFIER: Patient's identity verified by patient         stating name, Patient's identity verified by patient stating birth         date, Patient's identity verified by hospital ID bracelet, Patient         actively involved in identification process.   LAB DRAW: Initial lab draw performed, by venipuncture, from right         hand, in one attempt.   NOTES: Patient tolerated procedure well.   SAFETY: Side rails up, Cart/Stretcher in lowest position, Family         at bedside, Call light within reach, Hospital ID band on.       NURSING PROCEDURE: NURSE NOTES   NURSES NOTES: Patient examined by physician, Notes: PA Jones at         bedside. (19:26 SRW1)     Notes: Patient resting in position of comfort, awoken from rest advised         pain 5/10. denies nausea. (20:34 Janet.Jakob)     Patient re-evaluated by physician. (21:58 Janet.Jakob)     Patient re-evaluated by physician. (22:51 SRW1)     Notes: Dr. Henrene Hawking evaluated , patient medicated as noted for nausea, pt         stated can i please have pain medicine so I can go back to sleep.         MD aware. (23:52 SRW1)     Notes: patient resting on side aware of admission. (Sat Aug 02, 2011         01:23 SRW1)     Notes: Patient aware of admission, requesting pain medication. (Sat Aug 02, 2011 00:28 SRW1)        NURSING PROCEDURE: PO CHALLENGE (23:22 SRW1)   PO CHALLENGE: Oral fluid challenge indicated for follow up on         anti-emetics given, Oral challenge  performed, patient given, ginger         ale, Amount (mL) sips.   FOLLOW-UP: After procedure, patient did not tolerate oral         challenge, Notes: c/o nausea after sips of ginger ale.       DIAGNOSIS (Sat Aug 02, 2011 01:13 LHS0)   FINAL: PRIMARY: Uncontrolled Diabetes Mellitus, ADDITIONAL:         Intractable Abdominal Pain secondary to Gastroparesis.       DISPOSITION   PATIENT:  Disposition Type: Inpatient, Disposition: Regular Bed         Admission, Condition: Stable. (Sat Aug 02, 2011 01:13 LHS0)      Patient left the department. (Sat Aug 02, 2011 02:21 SRW1)       VITAL SIGNS   VITAL SIGNS: BP: 164/88 (Sitting), Pulse: 105, Resp: 22, Pain: 9,         O2 sat: 100 on Room air, Time: 08/01/2011 19:11. (19:11 AAD)     Temp: 99.3 (Oral), Time: 08/01/2011 19:14. (19:14 MCC)     BP: 155/87, Pulse: 100, Resp: 18, Pain: 8, O2 sat: 98 on Room air, Time:         08/01/2011 21:07. (21:07 SRW1)     BP: 154/90 (Sitting), Pulse: 102, Resp: 18, Pain: 8, O2 sat: 98 on Room         air, Time: 08/01/2011 23:21. (23:21 SRW1)     BP: 140/88, Pulse: 94, Resp: 18, Pain: 8, O2 sat: 98 on Room air, Time:         08/02/2011 01:45. (Sat Aug 02, 2011 01:45 SRW1)       INSTRUCTION (23:25 KMJ)   DISCHARGE:  DIABETES DIET, DIABETES MELLITUS, TYPE I (IDDM,         INSULIN DEPENDENT DIABETES, DM), GASTROPARESIS-(CGH).   [address]Cleotis Nipper [address], [place] [address], [phone], Roma Schanz,         GASTROENTEROLOGY, [address], [place] [address],         [phone].   SPECIAL:  Follow up with primary care physician.         Follow up with GI Doctor.         Use Reglan and Zofran to help with your symptoms.         Check your blood glucose daily and keep a log.         Return to the ER if condition worsens or new symptoms develop.        PRESCRIPTION     No recorded prescriptions   Key:     AAD=Deguzman, ACT III, Albert (Rain)  KMJ=Jones, PA-C, [place]  LDD0=David,     RN, Lyn     LHS0=Siegel, MD, Melvyn Neth  MCC=Childs, ACT III, Huey Bienenstock)      SRW1=Wertman, RN, Judeth Cornfield

## 2011-08-03 NOTE — Procedures (Signed)
Test Reason : Chest Pain   Blood Pressure : ***/*** mmHG   Vent. Rate : 084 BPM     Atrial Rate : 084 BPM      P-R Int : 120 ms          QRS Dur : 074 ms       QT Int : 366 ms       P-R-T Axes : 079 068 047 degrees      QTc Int : 432 ms   Normal sinus rhythm   When compared with ECG of 26-May-2011 19:40,   Nonspecific T wave abnormality, worse in Anterior leads   Confirmed by Francee Nodal ([id number]) on 08/04/2011 2:38:52 PM   Referred By:             Overread By: Francee Nodal

## 2011-08-03 NOTE — Procedures (Signed)
Test Reason : Chest Pain   Blood Pressure : ***/*** mmHG   Vent. Rate : 084 BPM     Atrial Rate : 084 BPM      P-R Int : 120 ms          QRS Dur : 074 ms       QT Int : 366 ms       P-R-T Axes : 079 068 047 degrees      QTc Int : 432 ms   Normal sinus rhythm   When compared with ECG of 26-May-2011 19:40,   Nonspecific T wave abnormality, worse in Anterior leads   Confirmed by Iyer, Venkat ([id number]) on 08/04/2011 2:38:52 PM   Referred By:             Overread By: Venkat Iyer

## 2012-04-15 LAB — METABOLIC PANEL, COMPREHENSIVE
ALT (SGPT): 16 U/L (ref 12–78)
Albumin: 3.5 gm/dl (ref 3.4–5.0)
Alk. phosphatase: 105 U/L (ref 50–136)
BUN: 10 mg/dl (ref 7–25)
Bilirubin, total: 0.5 mg/dl (ref 0.2–1.0)
CO2: 26 mEq/L (ref 21–32)
Calcium: 9 mg/dl (ref 8.5–10.1)
Chloride: 99 mEq/L (ref 98–107)
Creatinine: 0.8 mg/dl (ref 0.6–1.3)
GFR est AA: >60
GFR est non-AA: >60
Glucose: 142 mg/dl — ABNORMAL HIGH (ref 74–106)
Protein, total: 7.6 gm/dl (ref 6.4–8.2)
Sodium: 133 mEq/L — ABNORMAL LOW (ref 136–145)

## 2012-04-15 LAB — CBC WITH AUTOMATED DIFF
BASOPHILS: 1 % (ref 0–3)
EOSINOPHILS: 0 % (ref 0–5)
HCT: 43.2 % (ref 37.0–50.0)
HGB: 14.7 gm/dl (ref 13.0–17.2)
LYMPHOCYTES: 25 % — ABNORMAL LOW (ref 28–48)
MCH: 30.2 pg (ref 25.4–34.6)
MCHC: 34 gm/dl (ref 30.0–36.0)
MCV: 88.6 fL (ref 80.0–98.0)
MONOCYTES: 7 % (ref 1–13)
MPV: 9.9 fL (ref 6.0–10.0)
NEUTROPHILS: 68 % — ABNORMAL HIGH (ref 34–64)
NRBC: 0 (ref 0–0)
PLATELET: 356 10*3/uL (ref 140–450)
RBC: 4.88 M/uL (ref 3.60–5.20)
RDW: 12.4 % (ref 11.5–14.0)
WBC: 12.7 10*3/uL — ABNORMAL HIGH (ref 4.0–11.0)

## 2012-04-15 LAB — POC CHEM8
Anion gap: 12 (ref 10–20)
BUN: 11 mg/dl (ref 7–25)
CALCIUM,IONIZED: 4.5 mg/dl (ref 4.40–5.40)
CO2, TOTAL: 30 mmol/L (ref 21–32)
Chloride: 97 mEq/L — ABNORMAL LOW (ref 98–107)
Creatinine: 0.7 mg/dl (ref 0.6–1.3)
Glucose: 148 mg/dl — ABNORMAL HIGH (ref 74–106)
HCT: 43 % (ref 38–45)
HGB: 14.6 gm/dl (ref 13.0–17.2)
Potassium: 3.9 mEq/L (ref 3.5–4.9)
Sodium: 133 mEq/L — ABNORMAL LOW (ref 136–145)

## 2012-04-15 LAB — LIPASE: Lipase: 66 U/L — ABNORMAL LOW (ref 73–393)

## 2012-04-15 NOTE — ED Provider Notes (Signed)
[facility]  EMERGENCY DEPARTMENT TREATMENT REPORT  NAME:  [place], [place]  SEX:   F  ADMIT: 04/14/2012  DOB:   1985/08/17  MR#    [id number]  ROOM:    TIME SEEN: 05 14 AM  ACCT#  [id number]        CHIEF COMPLAINT:   Abdominal pain and vomiting.    HISTORY OF PRESENT ILLNESS:  This is a 27 year old female who was released yesterday from [facility] with gastroparesis and DKA, apparently discharged and starting having   some abdominal pain and nausea and decided to go ahead and come in and get   evaluated.  Also having a little bit of chest discomfort, no shortness of   breath.  The pain does not radiate to the arms, neck or jaw.  Felt her heart   was racing.  Right now has no pain.  Does have some midepigastric tenderness   with some vomiting.  No diarrhea, no constipation, no fevers.    REVIEW OF SYSTEMS:  CONSTITUTIONAL:  No fever, chills or weight loss.   EYES:   No visual symptoms.   ENT:  No sore throat, runny nose or other URI symptoms.   ENDOCRINE:  No diabetic symptoms.   HEMATOLOGIC/LYMPHATIC:   No excessive bruising or lymph node swelling.   ALLERGIC/IMMUNOLOGIC:  No urticaria or allergy symptoms.   RESPIRATORY:  No cough, shortness of breath or wheezing.   CARDIOVASCULAR:  Complaining of chest pain.  GASTROINTESTINAL:  Complaining of abdominal pain and vomiting.  GENITOURINARY:  No dysuria, frequency or urgency.   MUSCULOSKELETAL:  No joint pain or swelling.   INTEGUMENTARY:  No rashes.   NEUROLOGICAL:  No headaches, sensory or motor symptoms.     PAST MEDICAL HISTORY:  Insulin-dependent diabetes mellitus and gastroparesis.    FAMILY HISTORY:  Hypertension.    SOCIAL HISTORY:  Does not drink or smoke.    ALLERGIES:  SEE IBEX.    MEDICATIONS:  See Ibex.    PHYSICAL EXAMINATION:  VITAL SIGNS:  157/90, 117, 16, 98.9, 0 to 10 pain scale 7 out of 10, O2   saturation 100%.  GENERAL APPEARANCE:  Patient appears well developed and well nourished.     Appearance and behavior are age and situation appropriate.   HEENT:  Head:  Normocephalic, atraumatic.  Eyes:  Conjunctivae clear, lids   normal.  Pupils equal, symmetrical, and normally reactive.   NECK:  Supple, nontender, symmetrical, no masses or JVD, trachea midline,   thyroid not enlarged, nodular, or tender.   RESPIRATORY:  Clear and equal breath sounds.  No respiratory distress,   tachypnea, or accessory muscle use.   CARDIOVASCULAR:  Heart:  Regular rate and rhythm without any rubs, murmurs,   gallops or thrills.  GASTROINTESTINAL:  Abdomen soft, nontender, without complaint of pain to   palpation.  No hepatomegaly or splenomegaly.   SKIN:  Warm and dry without rashes.  NEUROLOGIC:  Alert, oriented.  Sensation intact, motor strength equal and   symmetric.     INITIAL ASSESSMENT AND MANAGEMENT PLAN:   Will go ahead and get CBC, CMP and lipase, give her a liter of fluid and go   ahead and give her Benadryl, Reglan and Zofran.  The patient's lab work came   back.  Rule out pancreatitis, rule out gastroparesis.  CMP came back.  Sodium   133, glucose 142.  The rest of the CMP was normal.  Lipase was 66, below   normal.  CBC was normal as well, slightly elevated WBCs at 12.7.  She received   a liter of fluid, felt better.  EKG was normal sinus rhythm with no ST   elevation or depression.    FINAL DIAGNOSES:  1.  Acute abdominal pain evaluation.  2.  Gastroparesis.  3.  Atypical chest pain.    DISPOSITION AND PLAN:  The patient was personally evaluated by myself and Dr. Dianna Rossetti who agrees   with the above assessment and plan.  The patient was sent home with Zofran,   make sure you follow up with your primary care provider.      ___________________  Dianna Rossetti M.D.  Dictated By: Hilaria Ota, PA-C    My signature above authenticates this document and my orders, the final   diagnosis (es), discharge prescription (s), and instructions in the [facility]   Pulsecheck record.  SC  D:04/15/2012  T: 04/15/2012 10:37:57   [id number]  Authenticated by Jerilynn Som, M.D. On 04/30/2012 06:31:32 AM

## 2012-05-11 LAB — POC CHEM8
Anion gap: 16 mmol/L (ref 10–20)
BUN: 18 mg/dl (ref 7–25)
CALCIUM,IONIZED: 4.4 mg/dl (ref 4.40–5.40)
CO2, TOTAL: 18 mmol/L — ABNORMAL LOW (ref 21–32)
Chloride: 107 mEq/L (ref 98–107)
Creatinine: 0.8 mg/dl (ref 0.6–1.3)
Glucose: 459 mg/dl — CR (ref 74–106)
HCT: 43 % (ref 38–45)
HGB: 14.6 gm/dl (ref 13.0–17.2)
Potassium: 4.8 mEq/L (ref 3.5–4.9)
Sodium: 135 mEq/L — ABNORMAL LOW (ref 136–145)

## 2012-05-11 LAB — GLUCOSE, POC
Glucose (POC): 413 mg/dL — CR (ref 65–105)
Glucose (POC): 192 mg/dL — ABNORMAL HIGH (ref 65–105)
Glucose (POC): 284 mg/dL — ABNORMAL HIGH (ref 65–105)
Glucose (POC): 249 mg/dL — ABNORMAL HIGH (ref 65–105)
Glucose (POC): 206 mg/dL — ABNORMAL HIGH (ref 65–105)
Glucose (POC): 213 mg/dL — ABNORMAL HIGH (ref 65–105)
Glucose (POC): 166 mg/dL — ABNORMAL HIGH (ref 65–105)
Glucose (POC): 196 mg/dL — ABNORMAL HIGH (ref 65–105)
Glucose (POC): 220 mg/dL — ABNORMAL HIGH (ref 65–105)
Glucose (POC): 199 mg/dL — ABNORMAL HIGH (ref 65–105)
Glucose (POC): 175 mg/dL — ABNORMAL HIGH (ref 65–105)

## 2012-05-11 LAB — BLOOD GAS, ARTERIAL
BASE EXCESS: -9 mmol/L — ABNORMAL LOW (ref <=3)
BICARBONATE: 17.5 mmol/L — ABNORMAL LOW (ref 22.0–26.0)
CO2, TOTAL: 19 mmol/L — ABNORMAL LOW (ref 21–32)
FIO2: 21
O2 SAT: 71.8 % (ref 70–75)
PCO2: 37.9 mm Hg — ABNORMAL LOW (ref 41.0–51.0)
PO2: 42.5 mm Hg — ABNORMAL HIGH (ref 35–40)
Performed by: 93724
pH: 7.273 — ABNORMAL LOW (ref 7.310–7.410)

## 2012-05-11 LAB — ELECTROLYTES
CO2: 17 mEq/L — ABNORMAL LOW (ref 21–32)
CO2: 19 mEq/L — ABNORMAL LOW (ref 21–32)
Chloride: 108 mEq/L — ABNORMAL HIGH (ref 98–107)
Chloride: 103 mEq/L (ref 98–107)
Potassium: 4.8 mEq/L (ref 3.5–5.1)
Potassium: 4.4 mEq/L (ref 3.5–5.1)
Sodium: 137 mEq/L (ref 136–145)
Sodium: 132 mEq/L — ABNORMAL LOW (ref 136–145)

## 2012-05-11 LAB — CBC WITH AUTOMATED DIFF
BAND NEUTROPHILS: 1 % (ref 0–11)
HCT: 39 % (ref 37.0–50.0)
HGB: 13.2 gm/dl (ref 13.0–17.2)
LYMPHOCYTES: 5 % — ABNORMAL LOW (ref 28–48)
MCH: 29.5 pg (ref 25.4–34.6)
MCHC: 33.8 gm/dl (ref 30.0–36.0)
MCV: 87.3 fL (ref 80.0–98.0)
MONOCYTES: 1 % (ref 1–13)
MPV: 11.8 fL — ABNORMAL HIGH (ref 6.0–10.0)
NEUTROPHILS: 93 % — ABNORMAL HIGH (ref 34–64)
PLATELET COMMENTS: NORMAL
PLATELET: 224 10*3/uL (ref 140–450)
RBC Morphology: NORMAL
RBC: 4.47 M/uL (ref 3.60–5.20)
RDW: 12.9 % (ref 11.5–14.0)
WBC: 10.9 10*3/uL (ref 4.0–11.0)

## 2012-05-11 LAB — METABOLIC PANEL, BASIC
BUN: 17 mg/dl (ref 7–25)
CO2: 17 mEq/L — ABNORMAL LOW (ref 21–32)
Calcium: 8.9 mg/dl (ref 8.5–10.1)
Chloride: 105 mEq/L (ref 98–107)
Creatinine: 0.8 mg/dl (ref 0.6–1.3)
GFR est AA: >60
GFR est non-AA: >60
Glucose: 399 mg/dl — ABNORMAL HIGH (ref 74–106)
Sodium: 135 mEq/L — ABNORMAL LOW (ref 136–145)

## 2012-05-11 LAB — POC HCG,URINE: HCG urine, QL: NEGATIVE

## 2012-05-11 LAB — POC URINE MACROSCOPIC
Bilirubin: NEGATIVE
Glucose: >=1000 mg/dl — AB
Ketone: >=160 mg/dl — AB
Leukocyte Esterase: NEGATIVE
Nitrites: NEGATIVE
Protein: 100 mg/dl — AB
Specific gravity: 1.02 (ref 1.005–1.030)
Urobilinogen: 0.2 EU/dl (ref 0.0–1.0)
pH (UA): 6 (ref 5–9)

## 2012-05-11 LAB — TSH 3RD GENERATION: TSH: 0.31 u[IU]/mL — ABNORMAL LOW (ref 0.358–3.740)

## 2012-05-11 LAB — T4, FREE: Free T4: 1.16 ng/dl (ref 0.76–1.46)

## 2012-05-11 LAB — ACETONE/KETONE, QL

## 2012-05-11 LAB — HEMOGLOBIN A1C WITH EAG: Hemoglobin A1c: 8.3 % — ABNORMAL HIGH (ref 4.8–6.0)

## 2012-05-11 LAB — MAGNESIUM: Magnesium: 1.5 mg/dl — ABNORMAL LOW (ref 1.8–2.4)

## 2012-05-11 LAB — PHOSPHORUS: Phosphorus: 3 mg/dl (ref 2.5–4.9)

## 2012-05-11 NOTE — ED Provider Notes (Signed)
[facility]  EMERGENCY DEPARTMENT TREATMENT REPORT  NAME:  [name] [name], [name] [name]  SEX:   F  ADMIT: 05/10/2012  DOB:   [birth year]  MR#    [id number]  ROOM:  5206  TIME SEEN: 09 53 PM  ACCT#  [id number]        I hereby certify this patient for admission based upon medical necessity as  noted below:      TIME OF EVALUATION:   8:15    CHIEF COMPLAINT:  Abdominal and chest pain.    HISTORY OF PRESENT ILLNESS:  The patient is a 27 year old female.  She reports she has had some chest  pressure that started this a.m.  It has been constant throughout the day.  She  also reports she has had 11 episodes of vomiting today.  No diarrhea.  She has  abdominal pain in the upper belly.  The patient is a diabetic type 1.  She  reports that her blood sugars have been in the 300s and they have been high   all  day.  Denies any fevers.  The patient does have a history of gastroparesis.    REVIEW OF SYSTEMS:   CONSTITUTIONAL:  No fever, chills, or weight loss.   EYES:  No visual symptoms.   ENT:  No sore throat, runny nose, or other URI symptoms.   RESPIRATORY:  No cough, shortness of breath, or wheezing.   CARDIOVASCULAR:  Chest pressure in the sternal area.   The patient reports it  is worse when pushing on it.  No palpitations.  GASTROINTESTINAL:  The patient reports 11 episodes of vomiting, no diarrhea.   Abdominal pain in the upper belly.  GENITOURINARY:  No dysuria, frequency, or urgency.   MUSCULOSKELETAL:  No joint pain or swelling.   INTEGUMENTARY:  No rashes.     PAST MEDICAL HISTORY:  The patient has a past medical history of type 1 diabetes and gastroparesis.   She has had a C-section.  Denies smoking or alcohol use.    ALLERGIES:  THE PATIENT IS ALLERGIC TO PENICILLINS.    CURRENT MEDICATIONS:  Humalog and Reglan.    PHYSICAL EXAMINATION:  VITAL SIGNS:  Blood pressure 168/97, pulse 110, respirations 18, temperature   is  98.1, O2 saturation is 98% on room air, pain is 9 out of 10.   GENERAL APPEARANCE:  Patient appears well developed and well nourished.   Appearance and behavior are age and situation appropriate.  The patient does  appear to be in pain.  HEENT:  Eyes:  Conjunctivae clear, lids normal.  Pupils equal, symmetrical,   and  normally reactive.  Ears/Nose:  Hearing is grossly intact to voice.  External  examinations of the ears and nose are unremarkable.   LYMPHATIC:  No cervical or submandibular lymphadenopathy palpated.   RESPIRATORY:  Clear and equal breath sounds.  No respiratory distress,  tachypnea, or accessory muscle use.   CARDIOVASCULAR:  Heart regular, without murmurs, gallops, rubs, or thrills.   The patient is mildly tachycardic but regular without murmurs, gallops, rubs   or  thrills.  CHEST:  Chest symmetrical without masses or tenderness.  Symmetrical without  masses.  The patient has tenderness in the sternum area when applying   pressure.  VASCULAR:  Calves soft and nontender.  No peripheral edema or significant  varicosities.  Carotid, femoral, and pedal pulses are satisfactory.  The  abdominal aorta is not palpably enlarged.  Calves are soft and nontender.  No  peripheral  edema or significant varicosities noted.  GASTROINTESTINAL:  Abdomen is soft.  Mild tenderness in the epigastric area.   There is no hepato- or splenomegaly.  No abdominal masses appreciated by  inspection or palpation.    MUSCULOSKELETAL:  Stance and gait appear normal.   SKIN:  Warm and dry without rashes.   NEUROLOGICAL:  The patient is alert and oriented times 3.    CONTINUATION BY Candis Shine, MD:     The patient was seen and examined with the nurse practitioner [facility].   I agree with the history and examination.    The patient is a 27 year old female with history of diabetes and gastroparesis  here today for recurrence and worsening gastroparetic-type pain with reported  blood sugars at home in the 200s to 300s.  On Accu-Chek, she has a blood sugar   of 459.  She has a decreased bicarbonate at 18, making this suspicious for  acute diabetic ketoacidosis.     Follow-up labs show a urinalysis with greater than 100 mg/dL of ketonuria and  marked glucosuria.  She has no leukocytes or nitrates.  CBC is unremarkable.   Venous blood gas shows a pH 7.273.  Given her marked acidosis, the patient was  started on insulin protocol for diabetic ketoacidosis.  Given her metabolic  panel results, sodium was 135, potassium was 5.5, the patient was started on  DKA protocol.  I contacted the on-call hospitalist Dr. Christoper Fabian who kindly  accepts patient under her care.    DISPOSITION:  Hospital admission.    ADMITTING DIAGNOSES:  1.  Nausea and vomiting.  2.  Acute diabetic ketoacidosis.   3.  Chronic gastroparesis.    Critical care time, excluding procedures, but including direct patient care,  reviewing medical records, evaluating results of diagnostic testing,  discussions with family members, and consulting with physicians:  30 minutes.      ___________________  Wynelle Bourgeois MD  Dictated By: Eliot Ford, NP    My signature above authenticates this document and my orders, the final  diagnosis (es), discharge prescription (s), and instructions in the [facility]  Pulsecheck record.  DF  D:05/10/2012  T: 05/11/2012 04:30:25  [id number]  Authenticated and Linus Orn by Wynelle Bourgeois, MD On 05/20/12 5:20:55 PM

## 2012-05-11 NOTE — H&P (Signed)
[facility]  History and Physical  NAME:  [name] [name], [name] [name]  SEX:   F  ADMIT: 05/10/2012  DOB:02/15/1985  MR#    [id number]  ROOM:  5206  ACCT#  [id number]    I hereby certify this patient for admission based upon medical necessity as   noted below:    <    CHIEF COMPLAINT:  Nausea, vomiting, abdominal pain.    HISTORY OF PRESENT ILLNESS:  The patient is a 27 year old female with history of type 1 diabetes and   gastroparesis came to the emergency room complaining of nausea, vomiting, and   abdominal pain.  The patient is on insulin pump at home and she states most of   the time her blood glucose has been well controlled.  She had also   gastroparesis and used Reglan.  She has a previous hospital admission for DKA.   Here in the ER, patient found to have an elevated blood glucose with blood   sugar of 459, bicarbonate is 18, and her pH was 7.273, and the patient was   suspected to have DKA. The patient admitted for further evaluation and   management.    PAST MEDICAL HISTORY:  Type 1 diabetes, gastroparesis, DKA.    PAST SURGICAL HISTORY:  Cesarean section.    ALLERGIES:  PENICILLIN.    HOME MEDICATIONS:  The patient is on insulin pump and Reglan.    FAMILY HISTORY:  Noncontributory.    SOCIAL HISTORY:  Denies smoking, alcohol or illicit drug use.    REVIEW OF SYSTEMS:  CONSTITUTIONAL:  No fever or chills.  EYES:  No visual disturbance.  EARS:  No hearing difficulty.  MOUTH:  No oral lesions.  NECK:  No neck stiffness.  CARDIOVASCULAR:  No chest pain or palpitation.  RESPIRATORY:  No cough or shortness of breathing.  GASTROINTESTINAL:  She has nausea and vomiting and abdominal pain.  GENITOURINARY:  No dysuria, frequency or urgency.  SKIN:  No rash.  MUSCULOSKELETAL:  No joint swelling or pain.  NEUROLOGIC:  No headache or dizziness.    PHYSICAL EXAMINATION:  GENERAL:  The patient is not in acute distress.  VITAL SIGNS:  Blood pressure 122/76, pulse rate 87, respirations 20,    temperature 98.8, O2 saturation 100% on room air.  HEENT:  Pupils are equal and reactive to light.  Anicteric sclerae.  NECK:  No JVD.  Thyroid not palpable.  CARDIOVASCULAR SYSTEM:  Regular rate and rhythm, S1, S2 well heard.  No murmur   or gallop.  LUNGS:  Clear to auscultation bilaterally.  ABDOMEN:  Soft, nontender, no distension, positive bowel sounds.  EXTREMITIES:  No edema or cyanosis.  SKIN:  No rash.  MUSCULOSKELETAL:  No joint swelling or tenderness.  NEUROLOGICAL:  Alert and oriented times 3.  No focal neurologic deficit.    LABS:  Blood gas:  pH 7.273, pCO2 of 37.9, pO2 of 42.5, bicarbonate 19, blood glucose   was 413.  Sodium 135, potassium 4.8, bicarbonate 17, BUN 17, creatinine 0.8,   magnesium 1.5.    ASSESSMENT:  1.  Diabetic ketoacidosis.  2.  Type 1 diabetes, uncontrolled.  3.  Intractable nausea and vomiting secondary to #4.  4.  Gastroparesis secondary to diabetic gastroparesis.    PLAN:  The patient admitted to medical floor.  Currently, she is on IV fluids and IV   insulin.  We will continue that until her DKA is resolved and her anion gap   closes. Monitor and replace her  electrolytes.  Once her DKA is resolved, we   will start her back to her insulin pump. Check her hemoglobin A1c to assess   her glycemic control.  We will give her pain medication for the abdominal   pain. Start her on DVT and GI prophylaxis.      ___________________  Theodis Aguas MD  Dictated By: .   [facility]  D:05/11/2012  T: 05/11/2012 14:47:58  [id number]  Authenticated by Meta Hatchet. Tenny Craw, M.D. On 05/12/2012 02:58:51 PM

## 2012-05-12 LAB — CBC WITH AUTOMATED DIFF
BASOPHILS: 0 % (ref 0–3)
EOSINOPHILS: 0 % (ref 0–5)
HCT: 42.2 % (ref 37.0–50.0)
HGB: 14.1 gm/dl (ref 13.0–17.2)
LYMPHOCYTES: 13 % — ABNORMAL LOW (ref 28–48)
MCH: 29.6 pg (ref 25.4–34.6)
MCHC: 33.4 gm/dl (ref 30.0–36.0)
MCV: 88.7 fL (ref 80.0–98.0)
MONOCYTES: 5 % (ref 1–13)
MPV: 11.2 fL — ABNORMAL HIGH (ref 6.0–10.0)
NEUTROPHILS: 82 % — ABNORMAL HIGH (ref 34–64)
NRBC: 0 (ref 0–0)
PLATELET: 219 10*3/uL (ref 140–450)
RBC: 4.76 M/uL (ref 3.60–5.20)
RDW: 12.9 % (ref 11.5–14.0)
WBC: 10.7 10*3/uL (ref 4.0–11.0)

## 2012-05-12 LAB — METABOLIC PANEL, BASIC
BUN: 8 mg/dl (ref 7–25)
CO2: 20 mEq/L — ABNORMAL LOW (ref 21–32)
Calcium: 8.7 mg/dl (ref 8.5–10.1)
Chloride: 102 mEq/L (ref 98–107)
Creatinine: 0.7 mg/dl (ref 0.6–1.3)
GFR est AA: >60
GFR est non-AA: >60
Glucose: 160 mg/dl — ABNORMAL HIGH (ref 74–106)
Potassium: 4.3 mEq/L (ref 3.5–5.1)
Sodium: 133 mEq/L — ABNORMAL LOW (ref 136–145)

## 2012-05-12 LAB — GLUCOSE, POC
Glucose (POC): 244 mg/dL — ABNORMAL HIGH (ref 65–105)
Glucose (POC): 248 mg/dL — ABNORMAL HIGH (ref 65–105)
Glucose (POC): 182 mg/dL — ABNORMAL HIGH (ref 65–105)
Glucose (POC): 178 mg/dL — ABNORMAL HIGH (ref 65–105)
Glucose (POC): 234 mg/dL — ABNORMAL HIGH (ref 65–105)
Glucose (POC): 221 mg/dL — ABNORMAL HIGH (ref 65–105)
Glucose (POC): 228 mg/dL — ABNORMAL HIGH (ref 65–105)
Glucose (POC): 187 mg/dL — ABNORMAL HIGH (ref 65–105)
Glucose (POC): 196 mg/dL — ABNORMAL HIGH (ref 65–105)
Glucose (POC): 200 mg/dL — ABNORMAL HIGH (ref 65–105)
Glucose (POC): 182 mg/dL — ABNORMAL HIGH (ref 65–105)

## 2012-05-12 LAB — PHOSPHORUS: Phosphorus: 2.7 mg/dl (ref 2.5–4.9)

## 2012-05-12 LAB — ELECTROLYTES
CO2: 17 mEq/L — ABNORMAL LOW (ref 21–32)
Chloride: 101 mEq/L (ref 98–107)
Potassium: 5.2 mEq/L — ABNORMAL HIGH (ref 3.5–5.1)
Sodium: 132 mEq/L — ABNORMAL LOW (ref 136–145)

## 2012-05-12 LAB — MAGNESIUM: Magnesium: 1.3 mg/dl — ABNORMAL LOW (ref 1.8–2.4)

## 2012-05-13 LAB — GLUCOSE, POC
Glucose (POC): 222 mg/dL — ABNORMAL HIGH (ref 65–105)
Glucose (POC): 281 mg/dL — ABNORMAL HIGH (ref 65–105)
Glucose (POC): 135 mg/dL — ABNORMAL HIGH (ref 65–105)
Glucose (POC): 77 mg/dL (ref 65–105)
Glucose (POC): 274 mg/dL — ABNORMAL HIGH (ref 65–105)
Glucose (POC): 169 mg/dL — ABNORMAL HIGH (ref 65–105)
Glucose (POC): 168 mg/dL — ABNORMAL HIGH (ref 65–105)
Glucose (POC): 208 mg/dL — ABNORMAL HIGH (ref 65–105)
Glucose (POC): 161 mg/dL — ABNORMAL HIGH (ref 65–105)
Glucose (POC): 83 mg/dL (ref 65–105)
Glucose (POC): 261 mg/dL — ABNORMAL HIGH (ref 65–105)
Glucose (POC): 266 mg/dL — ABNORMAL HIGH (ref 65–105)

## 2012-05-13 LAB — METABOLIC PANEL, BASIC
BUN: 8 mg/dl (ref 7–25)
BUN: 6 mg/dl — ABNORMAL LOW (ref 7–25)
CO2: 19 mEq/L — ABNORMAL LOW (ref 21–32)
CO2: 21 mEq/L (ref 21–32)
Calcium: 8.8 mg/dl (ref 8.5–10.1)
Calcium: 8.5 mg/dl (ref 8.5–10.1)
Chloride: 99 mEq/L (ref 98–107)
Chloride: 101 mEq/L (ref 98–107)
Creatinine: 0.7 mg/dl (ref 0.6–1.3)
Creatinine: 0.8 mg/dl (ref 0.6–1.3)
GFR est AA: >60
GFR est AA: >60
GFR est non-AA: >60
GFR est non-AA: >60
Glucose: 271 mg/dl — ABNORMAL HIGH (ref 74–106)
Glucose: 118 mg/dl — ABNORMAL HIGH (ref 74–106)
Potassium: 4.7 mEq/L (ref 3.5–5.1)
Potassium: 4.5 mEq/L (ref 3.5–5.1)
Sodium: 131 mEq/L — ABNORMAL LOW (ref 136–145)
Sodium: 132 mEq/L — ABNORMAL LOW (ref 136–145)

## 2012-05-13 LAB — CBC WITH AUTOMATED DIFF
BASOPHILS: 0 % (ref 0–3)
EOSINOPHILS: 0 % (ref 0–5)
HCT: 41.9 % (ref 37.0–50.0)
HGB: 14 gm/dl (ref 13.0–17.2)
LYMPHOCYTES: 14 % — ABNORMAL LOW (ref 28–48)
MCH: 29.4 pg (ref 25.4–34.6)
MCHC: 33.5 gm/dl (ref 30.0–36.0)
MCV: 87.9 fL (ref 80.0–98.0)
MONOCYTES: 10 % (ref 1–13)
MPV: 11.4 fL — ABNORMAL HIGH (ref 6.0–10.0)
NEUTROPHILS: 75 % — ABNORMAL HIGH (ref 34–64)
NRBC: 0 (ref 0–0)
PLATELET: 193 10*3/uL (ref 140–450)
RBC: 4.77 M/uL (ref 3.60–5.20)
RDW: 12.9 % (ref 11.5–14.0)
WBC: 8.7 10*3/uL (ref 4.0–11.0)

## 2012-05-13 LAB — MAGNESIUM: Magnesium: 1.3 mg/dl — ABNORMAL LOW (ref 1.8–2.4)

## 2012-05-13 LAB — PHOSPHORUS: Phosphorus: 3 mg/dl (ref 2.5–4.9)

## 2012-05-14 LAB — GLUCOSE, POC
Glucose (POC): 119 mg/dL — ABNORMAL HIGH (ref 65–105)
Glucose (POC): 120 mg/dL — ABNORMAL HIGH (ref 65–105)
Glucose (POC): 145 mg/dL — ABNORMAL HIGH (ref 65–105)
Glucose (POC): 169 mg/dL — ABNORMAL HIGH (ref 65–105)
Glucose (POC): 183 mg/dL — ABNORMAL HIGH (ref 65–105)
Glucose (POC): 204 mg/dL — ABNORMAL HIGH (ref 65–105)
Glucose (POC): 230 mg/dL — ABNORMAL HIGH (ref 65–105)
Glucose (POC): 242 mg/dL — ABNORMAL HIGH (ref 65–105)
Glucose (POC): 252 mg/dL — ABNORMAL HIGH (ref 65–105)
Glucose (POC): 261 mg/dL — ABNORMAL HIGH (ref 65–105)
Glucose (POC): 264 mg/dL — ABNORMAL HIGH (ref 65–105)
Glucose (POC): 282 mg/dL — ABNORMAL HIGH (ref 65–105)

## 2012-05-14 LAB — CBC W/O DIFF
HCT: 39.5 % (ref 37.0–50.0)
HGB: 13.4 gm/dl (ref 13.0–17.2)
MCH: 29.3 pg (ref 25.4–34.6)
MCHC: 33.8 gm/dl (ref 30.0–36.0)
MCV: 86.6 fL (ref 80.0–98.0)
MPV: 11.1 fL — ABNORMAL HIGH (ref 6.0–10.0)
PLATELET: 179 10*3/uL (ref 140–450)
RBC: 4.56 M/uL (ref 3.60–5.20)
RDW: 12.7 % (ref 11.5–14.0)
WBC: 6.7 10*3/uL (ref 4.0–11.0)

## 2012-05-14 LAB — METABOLIC PANEL, BASIC
BUN: 9 mg/dl (ref 7–25)
CO2: 21 mEq/L (ref 21–32)
Calcium: 8.2 mg/dl — ABNORMAL LOW (ref 8.5–10.1)
Chloride: 103 mEq/L (ref 98–107)
Creatinine: 0.9 mg/dl (ref 0.6–1.3)
GFR est AA: >60
GFR est non-AA: >60
Glucose: 157 mg/dl — ABNORMAL HIGH (ref 74–106)
Potassium: 4.4 mEq/L (ref 3.5–5.1)
Sodium: 134 mEq/L — ABNORMAL LOW (ref 136–145)

## 2012-05-14 LAB — MAGNESIUM: Magnesium: 1.5 mg/dl — ABNORMAL LOW (ref 1.8–2.4)

## 2012-05-14 LAB — PHOSPHORUS: Phosphorus: 2.4 mg/dl — ABNORMAL LOW (ref 2.5–4.9)

## 2012-05-16 LAB — T3 TOTAL: T3, total: 61 ng/dL — ABNORMAL LOW (ref 76–181)

## 2012-08-21 LAB — GLUCOSE, POC: Glucose (POC): 260 mg/dL — ABNORMAL HIGH (ref 65–105)

## 2012-08-22 LAB — BLOOD GAS, ARTERIAL
BASE EXCESS: -5 mmol/L — ABNORMAL LOW (ref <=3)
BICARBONATE: 20.4 mmol/L — ABNORMAL LOW (ref 22.0–26.0)
CO2, TOTAL: 22 mmol/L (ref 21–32)
FIO2: 21
O2 SAT: 59.9 % — ABNORMAL LOW (ref 70–75)
PCO2: 39.9 mm Hg — ABNORMAL LOW (ref 41.0–51.0)
PO2: 33.7 mm Hg — ABNORMAL LOW (ref 35–40)
Performed by: 93724
Respiratory Rate: 24
pH: 7.316 (ref 7.310–7.410)

## 2012-08-22 LAB — CBC WITH AUTOMATED DIFF
BASOPHILS: 0 % (ref 0–3)
EOSINOPHILS: 0 % (ref 0–5)
HCT: 43 % (ref 37.0–50.0)
HGB: 14.4 gm/dl (ref 13.0–17.2)
IMMATURE GRANULOCYTES: 0.3 % — ABNORMAL HIGH (ref 0.0–0.0)
LYMPHOCYTES: 14 % — ABNORMAL LOW (ref 28–48)
MCH: 29.3 pg (ref 25.4–34.6)
MCHC: 33.5 gm/dl (ref 30.0–36.0)
MCV: 87.4 fL (ref 80.0–98.0)
MONOCYTES: 5 % (ref 1–13)
MPV: 12.1 fL — ABNORMAL HIGH (ref 6.0–10.0)
NEUTROPHILS: 80 % — ABNORMAL HIGH (ref 34–64)
NRBC: 0 (ref 0–0)
PLATELET: 251 10*3/uL (ref 140–450)
RBC: 4.92 M/uL (ref 3.60–5.20)
RDW-SD: 38.7 (ref 36.4–46.3)
WBC: 16.7 10*3/uL — ABNORMAL HIGH (ref 4.0–11.0)

## 2012-08-22 LAB — POC URINE MACROSCOPIC
Glucose: 500 mg/dl — AB
Ketone: 80 mg/dl — AB
Leukocyte Esterase: NEGATIVE
Nitrites: NEGATIVE
Protein: >=300 mg/dl — AB
Specific gravity: >=1.03 (ref 1.005–1.030)
Urobilinogen: 1 EU/dl (ref 0.0–1.0)
pH (UA): 6 (ref 5–9)

## 2012-08-22 LAB — METABOLIC PANEL, COMPREHENSIVE
ALT (SGPT): 20 U/L (ref 12–78)
AST (SGOT): 13 U/L — ABNORMAL LOW (ref 15–37)
Albumin: 3.4 gm/dl (ref 3.4–5.0)
Alk. phosphatase: 128 U/L (ref 50–136)
BUN: 19 mg/dl (ref 7–25)
Bilirubin, total: 0.8 mg/dl (ref 0.2–1.0)
CO2: 19 mEq/L — ABNORMAL LOW (ref 21–32)
Calcium: 9.4 mg/dl (ref 8.5–10.1)
Chloride: 103 mEq/L (ref 98–107)
Creatinine: 0.9 mg/dl (ref 0.6–1.3)
GFR est AA: >60
GFR est non-AA: >60
Glucose: 270 mg/dl — ABNORMAL HIGH (ref 74–106)
Potassium: 4.3 mEq/L (ref 3.5–5.1)
Protein, total: 7.9 gm/dl (ref 6.4–8.2)
Sodium: 135 mEq/L — ABNORMAL LOW (ref 136–145)

## 2012-08-22 LAB — LIPASE: Lipase: 49 U/L — ABNORMAL LOW (ref 73–393)

## 2012-08-22 NOTE — ED Provider Notes (Signed)
[facility]  EMERGENCY DEPARTMENT TREATMENT REPORT  NAME:  [name] [name], [name] [name]  SEX:   F  ADMIT: 08/21/2012  DOB:   Jul 14, 1985  MR#    [id number]  ROOM:    TIME SEEN: 09 19 PM  ACCT#  [id number]        GI PHYSICIAN:  Mike Gip, MD    CHIEF COMPLAINT:  The patient reports an exacerbation of her gastroparesis.    HISTORY OF PRESENT ILLNESS:  The patient is a 27 year old female who has a history of gastroparesis, states   that she has had vomiting that she has been unable to stop.  She had around   15 episodes yesterday.  Today, she has had 11 to 12 episodes.  Denies any   diarrhea.  Denies any fevers.  Reports that she has had some chills.  She has   been in bed for the past 2 days.  She reports that she has not checked her   blood sugar.  She is diabetic.  The patient states that she does not have   periods due to being on Depo-Provera.  She states that these symptoms are   exactly the same from when she had gastroparesis flares in the past.    REVIEW OF SYSTEMS:  CONSTITUTIONAL:  No fever, chills, or weight loss.   EYES:   No visual symptoms.   ENT:  No sore throat, runny nose, or other URI symptoms.   RESPIRATORY:  No cough, shortness of breath, or wheezing.   CARDIOVASCULAR:  No chest pain, chest pressure, or palpitations.   GASTROINTESTINAL:  The patient reports around 15 episodes of vomiting   yesterday, 12 episodes today.  Denies any diarrhea.  Reports some abdominal   pain, states more on the left side, states it is consistent with her   gastroparesis.  GENITOURINARY:  No dysuria, frequency, or urgency.   MUSCULOSKELETAL:  No joint pain or swelling.   INTEGUMENTARY:  No rashes.   NEUROLOGICAL:  No headaches, sensory or motor symptoms.     PAST MEDICAL HISTORY:  Includes gastroparesis and diabetes.      PAST SURGICAL HISTORY:  She has had a C-section.    SOCIAL HISTORY:  The patient denies smoking, alcohol or drug use.    ALLERGIES:  Include PENICILLIN.    CURRENT MEDICATIONS:   Include Humalog and Reglan.    PHYSICAL EXAMINATION:  VITAL SIGNS:  Blood pressure is 168/93, pulse 103, respirations 20,   temperature is 98.6, O2 sat is 100% on room air, pain is 9  out of 10.  GENERAL APPEARANCE:  The patient appears well developed and well nourished.    Appearance and behavior are age and situation appropriate.   Eyes:  Conjunctivae clear, lids normal.  Pupils equal, symmetrical, and   normally reactive.   LYMPHATIC:   No cervical or submandibular lymphadenopathy palpated.   RESPIRATORY:  Clear and equal breath sounds.  No respiratory distress,   tachypnea, or accessory muscle use.     CARDIOVASCULAR:   Heart regular, without murmurs, gallops, rubs, or thrills.   ABDOMEN:   Abdomen is soft.  There is tenderness in the left upper and lower   quadrants with palpation.  She also has some epigastric tenderness.  There is   no hepato or splenomegaly.  There are no abdominal masses appreciated by   inspection or palpation.  MUSCULOSKELETAL: Stance and gait appear normal.   SKIN:  Warm and dry without rashes.   NEUROLOGIC:  Alert,  oriented.  Sensation intact, motor strength equal and   symmetric.   PSYCHIATRIC:  Oriented to time, place and person.  Mood and affect    appropriate.     CONTINUATION BY Imogene Burn, MD:    RESULTS:   A venous blood gas was done to rule out acidosis, she has a pH of 7.32.  CMP   shows elevated glucose of 27 and low CO2 19.  White count is elevated at 16.7,   but there is no definite source of infection present.  She is also afebrile.     COURSE IN THE EMERGENCY DEPARTMENT:   The patient received a liter of saline, Protonix 40 mg, Reglan 10 mg, Benadryl   25 mg, Pepcid 20 mg, all IV.  On recheck at 2145 she is feeling much better   and ready for discharge.  She states she has Reglan at home, does not need any   from here.    DIAGNOSES:  1.  Nausea and vomiting.  2.  Epigastric abdominal pain.    3.  Gastroparesis.  4. Type 1 insulin-dependent diabetes.    DISPOSITION:    The patient is discharged home in stable condition, with instructions to   follow up with their regular doctor.  They are advised to return immediately   for any worsening or symptoms of concern.      ___________________  Imogene Burn M.D.  Dictated By: Eliot Ford, NP    My signature above authenticates this document and my orders, the final   diagnosis (es), discharge prescription (s), and instructions in the [facility]   Pulsecheck record.    If you have any questions please contact [phone].    PB  D:08/21/2012 21:19:46  T: 08/22/2012 10:59:45  [id number]  Authenticated by Gerlene Burdock L. Mardie Kellen, M.D. On 08/23/2012 06:13:56 PM

## 2012-08-23 LAB — POC URINE MACROSCOPIC
Glucose: NEGATIVE mg/dl
Ketone: 80 mg/dl — AB
Nitrites: NEGATIVE
Protein: 100 mg/dl — AB
Specific gravity: >=1.03 (ref 1.005–1.030)
Urobilinogen: 1 EU/dl (ref 0.0–1.0)
pH (UA): 5.5 (ref 5–9)

## 2012-08-23 LAB — CBC WITH AUTOMATED DIFF
BASOPHILS: 0 % (ref 0–3)
EOSINOPHILS: 0 % (ref 0–5)
HCT: 41.6 % (ref 37.0–50.0)
HGB: 14.3 gm/dl (ref 13.0–17.2)
IMMATURE GRANULOCYTES: 0.3 % — ABNORMAL HIGH (ref 0.0–0.0)
LYMPHOCYTES: 20 % — ABNORMAL LOW (ref 28–48)
MCH: 29.8 pg (ref 25.4–34.6)
MCHC: 34.4 gm/dl (ref 30.0–36.0)
MCV: 86.7 fL (ref 80.0–98.0)
MONOCYTES: 5 % (ref 1–13)
MPV: 12.1 fL — ABNORMAL HIGH (ref 6.0–10.0)
NEUTROPHILS: 75 % — ABNORMAL HIGH (ref 34–64)
NRBC: 0 (ref 0–0)
PLATELET: 310 10*3/uL (ref 140–450)
RBC: 4.8 M/uL (ref 3.60–5.20)
RDW-SD: 38.5 (ref 36.4–46.3)
WBC: 13.6 10*3/uL — ABNORMAL HIGH (ref 4.0–11.0)

## 2012-08-23 LAB — POC URINE MICROSCOPIC

## 2012-08-23 LAB — BLOOD GAS, ARTERIAL
BASE EXCESS: -2 mmol/L (ref <=3)
BICARBONATE: 22.1 mmol/L (ref 22.0–26.0)
CO2, TOTAL: 23 mmol/L (ref 21–32)
FIO2: 21
O2 SAT: 85.1 % — ABNORMAL HIGH (ref 70–75)
PCO2: 36.5 mm Hg — ABNORMAL LOW (ref 41.0–51.0)
PO2: 50.3 mm Hg — ABNORMAL HIGH (ref 35–40)
Performed by: 93724
pH: 7.391 (ref 7.310–7.410)

## 2012-08-23 LAB — METABOLIC PANEL, COMPREHENSIVE
ALT (SGPT): 15 U/L (ref 12–78)
AST (SGOT): 17 U/L (ref 15–37)
Albumin: 3.1 gm/dl — ABNORMAL LOW (ref 3.4–5.0)
Alk. phosphatase: 115 U/L (ref 50–136)
BUN: 12 mg/dl (ref 7–25)
Bilirubin, total: 1 mg/dl (ref 0.2–1.0)
CO2: 19 mEq/L — ABNORMAL LOW (ref 21–32)
Calcium: 8.7 mg/dl (ref 8.5–10.1)
Chloride: 107 mEq/L (ref 98–107)
Creatinine: 0.7 mg/dl (ref 0.6–1.3)
GFR est AA: >60
GFR est non-AA: >60
Glucose: 121 mg/dl — ABNORMAL HIGH (ref 74–106)
Potassium: 3.5 mEq/L (ref 3.5–5.1)
Protein, total: 7.1 gm/dl (ref 6.4–8.2)
Sodium: 139 mEq/L (ref 136–145)

## 2012-08-23 LAB — POC CHEM8
Anion gap: 12 mmol/L (ref 10–20)
BUN: 18 mg/dl (ref 7–25)
CALCIUM,IONIZED: 3.8 mg/dl — ABNORMAL LOW (ref 4.40–5.40)
CO2, TOTAL: 21 mmol/L (ref 21–32)
Chloride: 107 mEq/L (ref 98–107)
Creatinine: 1 mg/dl (ref 0.6–1.3)
Glucose: 100 mg/dl (ref 74–106)
HCT: 44 % (ref 38–45)
HGB: 15 gm/dl (ref 13.0–17.2)
Potassium: 7.7 mEq/L — CR (ref 3.5–4.9)
Sodium: 132 mEq/L — ABNORMAL LOW (ref 136–145)

## 2012-08-23 LAB — GLUCOSE, POC
Glucose (POC): 276 mg/dL — ABNORMAL HIGH (ref 65–105)
Glucose (POC): 255 mg/dL — ABNORMAL HIGH (ref 65–105)
Glucose (POC): 104 mg/dL (ref 65–105)

## 2012-08-23 LAB — POC HCG,URINE: HCG urine, QL: NEGATIVE

## 2012-08-23 LAB — LIPASE: Lipase: 51 U/L — ABNORMAL LOW (ref 73–393)

## 2012-08-23 NOTE — ED Provider Notes (Signed)
[facility]  EMERGENCY DEPARTMENT TREATMENT REPORT  NAME:  [place], [place]  SEX:   F  ADMIT: 08/22/2012  DOB:   1985-07-10  MR#    [id number]  ROOM:  EO14  TIME SEEN: 02 12 AM  ACCT#  [id number]        PRIMARY CARE PHYSICIAN:  None.      GASTROENTEROLOGIST:    Dr. Nolon Nations.      CHIEF COMPLAINT:  Abdominal pain, nausea, vomiting.    HISTORY OF PRESENT ILLNESS:  This is a 27 year old female who comes back into the Emergency Department for   evaluation of abdominal pain, nausea, vomiting.  She was seen in the Emergency   Department yesterday for the same and was discharged.  Initially she was   feeling better, but this morning she began to experience multiple episodes of   vomiting again.  She describes it as occurring approximately 8 times.  She   denies any hematemesis.  Her last bowel movement was yesterday and it was   normal.  No melena or hematochezia.  She denies any fevers.  She is   experiencing left upper quadrant abdominal pain that she describes as someone   punching her and it is similar to her pain that she has experienced in the   past that  has been associated with her gastroparesis.  She feels that there   has been no change in characteristic to that pain that she has experienced   multiple times before.  She last saw her gastroenterologist, Dr. Nolon Nations,  last   year.  She has not really had any problems until now.  She denies any other   alleviating or aggravating factors associated with her condition.    REVIEW OF SYSTEMS:  CONSTITUTIONAL:  No fever, chills, or weight loss.    EYES:   No visual symptoms.    ENT:  No sore throat, runny nose, or other URI symptoms.    ENDOCRINE:  Denies hyperglycemia.  RESPIRATORY:  No cough, shortness of breath, or wheezing.    CARDIOVASCULAR:  No chest pain, chest pressure, or palpitations.    GASTROINTESTINAL:  Vomiting, abdominal pain, denies diarrhea.  GENITOURINARY:  No dysuria, frequency, or urgency.    MUSCULOSKELETAL:  No joint pain or swelling.     INTEGUMENTARY:  No rashes.    NEUROLOGICAL:  No headaches, sensory or motor symptoms.      PAST MEDICAL HISTORY:  Gastroparesis, insulin-dependent diabetes, C-section.    SOCIAL HISTORY:  Denies alcohol, tobacco and drug use.    FAMILY HISTORY:  Noncontributory.    CURRENT MEDICATIONS:  Listed and reviewed in [facility].    ALLERGIES:  PENICILLIN.    PHYSICAL EXAMINATION:  VITAL SIGNS:  Blood pressure 159/98, pulse 109, respirations 18, temperature   is 100.1, pain is 9 out 10, O2 saturations 98% on room air.    GENERAL APPEARANCE:  The patient appears well developed and well nourished.    Appearance and behavior are age and situation appropriate.    HEENT:  Eyes:  Conjunctivae clear, lids normal.  Pupils equal, symmetrical,   and normally reactive.  Mouth and throat:  The surfaces of the pharynx, palate   and tongue are pink, dry and without lesions.  NECK:  Supple, nontender, symmetrical, no masses or JVD, trachea midline,   thyroid not enlarged, nodular, or tender.    LYMPHATICS:  No cervical or submandibular lymphadenopathy palpated.    RESPIRATORY:  Clear and equal breath sounds.  No respiratory  distress,   tachypnea, or accessory muscle use.    CARDIOVASCULAR:  The heart is tachycardic without murmurs, gallops, rubs or   thrills.  CHEST:  Chest symmetrical without masses or tenderness.    GASTROINTESTINAL:  The abdomen is soft, tender to palpation in left upper   quadrant.  No abdominal or inguinal masses appreciated by inspection or   palpation.    MUSCULOSKELETAL:  Stance and gait appear normal.    SKIN:  Warm and dry without rashes.      INITIAL ASSESSMENT AND MANAGEMENT PLAN:  This is a 27 year old female who comes in with multiple episodes of vomiting,   left upper  quadrant abdominal pain that she describes as pain that she has   always experienced in the past associated with her gastroparesis.  We will   initially check an i-STAT 8 to rule out DKA.  Will follow that up with labs to    rule out pancreatitis, hepatitis.  We will check urine for infection, urine   pregnancy.  We will medicate her with a liter of normal saline, IV Zofran,   Protonix,  Pepcid, Benadryl for her discomfort and Bentyl and  reevaluate.      CONTINUATION BY Haze Justin, MD:      IMPRESSION:    Abdominal pain, nausea, vomiting, previous history of gastroparesis with same   symptoms.  The patient was seen in the Emergency Department yesterday, returns   today with persistent discomfort and inability to tolerate p.o.,  complaints   of persistent vomiting, complaining of upper abdominal pain.    EMERGENCY DEPARTMENT COURSE:  The patient received intravenous fluids.    DIAGNOSTIC STUDIES:  CBC with WBC of 13.6, hemoglobin and hematocrit 14 and 41.  The i-STAT was   hemolyzed.  CMP subsequently obtained which was unremarkable except for   bicarbonate decreased to 19, glucose 121.  Urine dip:  Trace leukocyte   esterase.  Urine pregnancy is negative.  The patient receiving intravenous   fluids, Zofran, Benadryl, Pepcid, Protonix and still having nausea and   vomiting.  Received Reglan and Bentyl and additional fluids,  still   uncomfortable.  The decision for admission   for further management was made.    The patient will be admitted to [facility] hospitalist, Dr. Carnella Guadalajara,  and with   GI consultation.    CONDITION ON ADMISSION:  Stable.    FINAL DIAGNOSIS:  Intractable abdominal pain and vomiting secondary to diabetic gastroparesis.      CONTINUATION BY Haze Justin, MD:      ADDENDUM:      Discussed the patient with [facility] Hospitalists and he requested that we place   the patient in ED  Observation for attempted initial trial.  If the patient   is unable to improve to the point she can be discharged home within the  first   24 hours,  he recommended that we refer to the [facility] hospitalist at that   time.  Discussed the plan with the patient.  She is agreeable with such.      CONDITION ON ASSIGNMENT:    Stable.     FINAL DIAGNOSIS:    Abdominal pain and vomiting secondary to diabetic gastroparesis.    If the patient's symptoms are not improving, we can consult GI in the morning.      ___________________  Konrad Felix MD  Dictated By: Dayton Scrape Rice, PA-C    My signature above authenticates this document and my orders, the  final   diagnosis (es), discharge prescription (s), and instructions in the [facility] record.    If you have any questions please contact [phone].    LR  D:08/23/2012 02:12:15  T: 08/23/2012 02:34:44  [id number]  Authenticated by Janett Billow. Henrene Hawking, M.D. On 09/12/2012 07:43:47 PM

## 2012-08-24 LAB — GLUCOSE, POC
Glucose (POC): 262 mg/dL — ABNORMAL HIGH (ref 65–105)
Glucose (POC): 284 mg/dL — ABNORMAL HIGH (ref 65–105)
Glucose (POC): 260 mg/dL — ABNORMAL HIGH (ref 65–105)

## 2012-08-24 NOTE — Discharge Summary (Signed)
[facility]  ED Discharge Summary  NAME:  [name] [name], [name] [name]  SEX:   F  DOB: 1985/10/10  MR#    [id number]  ROOM:  EO14  ACCT#  [id number]        ADMISSION TO OBSERVATION:  08/23/12 at 0235.      DISCHARGE FROM OBSERVATION:  08/24/12 at 1819.     COURSE IN EMERGENCY DEPARTMENT:  The patient is a 27 year old female who presented initially complaining of   abdominal pain.  She was first seen in our facility 07/27, but presented again   on 07/28, complaining of multiple episodes of vomiting without diarrhea, no   hematochezia, no melena, no fever.  She was also complaining of left upper   quadrant pain that felt punching.  She stated the pain was similar to past   gastroparesis.  She was initially admitted to [facility], then asked   by [facility] to go to OBS and follow up with GI consultation.  In the ER, she   was treated with Pepcid, Protonix, Bentyl, IV fluids and Zofran.  She was then   placed in ED observation for symptom management, GI consult.    COURSE IN ED OBS:  The patient was treated with IV fluid as well as dicyclomine IM q. 6h.  for   cramping and diphenhydramine q.6 h. to be given with Reglan.  She was   evaluated by nurse practitioner, Lu Duffel, of Dr. Jerl Santos group this   morning.  She gave a dose of Protonix to the patient and scheduled her an   outpatient appointment for tomorrow.  She did not assess any findings   concerning for inpatient management.  The patient also received serial   fingerstick glucose as she is a diabetic.  The patient also received IV fluids   for hydration.  She received insulin 5 units subcutaneous after elevated   fingerstick of close to 300.  She also received IV Toradol when she developed   a headache.  The patient states Toradol did improve her headache pain.  She   should still have some residual abdominal pain, but felt comfortable following   up with GI tomorrow.    PHYSICAL EXAMINATION:    VITAL SIGNS:  Blood pressure is 179/90, pulse of 116, respirations 20,   temperature 98.9, pain is 0 out of 10, O2 sat 100% on room air.  GENERAL APPEARANCE:  Patient appears well developed and well nourished.    Appearance and behavior are age and situation appropriate.   EYES: Conjunctivae clear, lids normal.   ENT:  No sore throat, runny nose, or other URI symptoms.   HEMATOLOGIC/LYMPHATIC:   No excessive bruising or lymph node swelling.   RESPIRATORY:  Clear and equal breath sounds.  No respiratory distress, no   wheezes, rales or rhonchi.  CARDIOVASCULAR:  Heart regular rate and rhythm.  GASTROINTESTINAL:  Abdomen soft, slight left side is tender to palpation.  No   rebound tenderness, no rigidity or guarding.  No skin changes.  MUSCULOSKELETAL:  Stance and gait appear normal.  SKIN:  Warm and dry, no rashes.  NEUROLOGIC:  The patient is alert and oriented.  PSYCHIATRIC:  Judgment appears appropriate.     DIAGNOSES:  1.  Persistent abdominal pain and vomiting.  2.  Diabetic gastroparesis.    DISPOSITION:  The patient was discharged to home.  We did have concern for her elevated   pulse throughout her stay.  We hydrated her with IV normal saline.  It was   believed that her pulse may have been elevated due to multiple doses of   Benadryl.  I did reassess her pulse myself and got a reading of 106.  The   patient stated she felt comfortable to go home and would return to our   facility if she experiences any chest pain, any sensation of racing heart, or   any acute worsening abdominal symptoms.  She has an appointment tomorrow with   Dr. Nolon Nations in the afternoon.  She was given an appointment card and plans to   follow up.  The patient was personally evaluated by myself and Dr.   Carmela Hurt, who agrees with the above assessment and plan.      ___________________  Christiana Pellant MD  Dictated By:Marland Kitchen     My signature above authenticates this document and my orders, the final    diagnosis(es), discharge prescription(s) and instructions in the [facility] record.    If you have any questions please contact [phone].    AK  D:08/24/2012 19:35:13  T: 08/24/2012 20:09:37  [id number]  Authenticated by Carlis Stable. Carmela Hurt, M.D. On 08/25/2012 09:18:14 AM

## 2012-09-16 LAB — METABOLIC PANEL, COMPREHENSIVE
ALT (SGPT): 21 U/L (ref 12–78)
AST (SGOT): 22 U/L (ref 15–37)
Albumin: 4.2 gm/dl (ref 3.4–5.0)
Alk. phosphatase: 148 U/L — ABNORMAL HIGH (ref 50–136)
BUN: 14 mg/dl (ref 7–25)
Bilirubin, total: 0.6 mg/dl (ref 0.2–1.0)
CO2: 16 mEq/L — ABNORMAL LOW (ref 21–32)
Calcium: 9.9 mg/dl (ref 8.5–10.1)
Chloride: 103 mEq/L (ref 98–107)
Creatinine: 0.9 mg/dl (ref 0.6–1.3)
GFR est AA: >60
GFR est non-AA: >60
Glucose: 279 mg/dl — ABNORMAL HIGH (ref 74–106)
Potassium: 4.4 mEq/L (ref 3.5–5.1)
Protein, total: 8.8 gm/dl — ABNORMAL HIGH (ref 6.4–8.2)
Sodium: 138 mEq/L (ref 136–145)

## 2012-09-16 LAB — POC CHEM8
Anion gap: 21 mmol/L — ABNORMAL HIGH (ref 10–20)
BUN: 17 mg/dl (ref 7–25)
CALCIUM,IONIZED: 4.5 mg/dl (ref 4.40–5.40)
CO2, TOTAL: 15 mmol/L — ABNORMAL LOW (ref 21–32)
Chloride: 107 mEq/L (ref 98–107)
Creatinine: 0.7 mg/dl (ref 0.6–1.3)
Glucose: 300 mg/dl — ABNORMAL HIGH (ref 74–106)
HCT: 47 % — ABNORMAL HIGH (ref 38–45)
HGB: 16 gm/dl (ref 13.0–17.2)
Potassium: 5.6 mEq/L — ABNORMAL HIGH (ref 3.5–4.9)
Sodium: 136 mEq/L (ref 136–145)

## 2012-09-16 LAB — CBC WITH AUTOMATED DIFF
BASOPHILS: 0.3 % (ref 0–3)
EOSINOPHILS: 0 % (ref 0–5)
HCT: 42.7 % (ref 37.0–50.0)
HGB: 14.5 gm/dl (ref 13.0–17.2)
IMMATURE GRANULOCYTES: 0.4 % — ABNORMAL HIGH (ref 0.00–0.00)
LYMPHOCYTES: 7.2 % — ABNORMAL LOW (ref 28–48)
MCH: 30.4 pg (ref 25.4–34.6)
MCHC: 34 gm/dl (ref 30.0–36.0)
MCV: 89.5 fL (ref 80.0–98.0)
MONOCYTES: 1.2 % (ref 1–13)
MPV: 12.2 fL — ABNORMAL HIGH (ref 6.0–10.0)
NEUTROPHILS: 90.9 % — ABNORMAL HIGH (ref 34–64)
NRBC: 0 (ref 0–0)
PLATELET: 278 10*3/uL (ref 140–450)
RBC: 4.77 M/uL (ref 3.60–5.20)
RDW-SD: 42.3 (ref 36.4–46.3)
WBC: 17.1 10*3/uL — ABNORMAL HIGH (ref 4.0–11.0)

## 2012-09-16 LAB — BLOOD GAS, ARTERIAL
BASE EXCESS: -10 mmol/L — ABNORMAL LOW (ref <=3)
BICARBONATE: 14.4 mmol/L — ABNORMAL LOW (ref 22.0–26.0)
CO2, TOTAL: 15 mmol/L — ABNORMAL LOW (ref 21–32)
FIO2: 21
O2 SAT: 99.1 % — ABNORMAL HIGH (ref 70–75)
PCO2: 29.1 mm Hg — ABNORMAL LOW (ref 41.0–51.0)
PO2: 147.2 mm Hg — ABNORMAL HIGH (ref 35–40)
Patient temp.: 98
Performed by: 96566
pH: 7.303 — ABNORMAL LOW (ref 7.310–7.410)

## 2012-09-16 LAB — GLUCOSE, POC: Glucose (POC): 247 mg/dL — ABNORMAL HIGH (ref 65–105)

## 2012-09-16 LAB — POC URINE MACROSCOPIC
Bilirubin: NEGATIVE
Glucose: 500 mg/dl — AB
Ketone: 80 mg/dl — AB
Leukocyte Esterase: NEGATIVE
Nitrites: NEGATIVE
Protein: >=300 mg/dl — AB
Specific gravity: >=1.03 (ref 1.005–1.030)
Urobilinogen: 0.2 EU/dl (ref 0.0–1.0)
pH (UA): 5.5 (ref 5–9)

## 2012-09-16 LAB — POC HCG,URINE: HCG urine, QL: NEGATIVE

## 2012-09-16 LAB — LIPASE: Lipase: 44 U/L — ABNORMAL LOW (ref 73–393)

## 2012-09-16 LAB — ACETONE/KETONE, QL

## 2012-09-16 NOTE — H&P (Signed)
[facility]  History and Physical  NAME:  [name] [name], [name] [name]  SEX:   F  ADMIT: 09/16/2012  DOB:1985-02-06  MR#    [id number]  ROOM:  5215  ACCT#  [id number]    I hereby certify this patient for admission based upon medical necessity as   noted below:    <    DATE OF SERVICE:  09/16/2012.    CHIEF COMPLAINT:  Abdominal pain with nausea and vomiting over the last couple of days up to 4   days.    HISTORY OF PRESENT ILLNESS:  This is a kindly 27 year old female who has known insulin-dependent diabetes   mellitus on an insulin combination for which she had previously been followed   by Dr. Adah Salvage and apparently had an appointment to see Dr. Alphonzo Lemmings but had   not been able to do as it is scheduled for October.  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  While doing so, not eating any   out of the way sweets, although she does admit to some intermittent sugary   drinks and foods from time to time.  In any event, over the last 4 days, she   has noted some increasing abdominal pain, crampy and sharp with nausea and   vomiting and what she says is coffee ground like emesis with some significant   chest burning and pain after the event that is not quite reproducible on   palpation tonight when I examined her.  Nevertheless, she has no associated   back pain, dysuria, hematuria, diarrhea, melena, hematochezia or constipation   issues.  The patient also has no headaches or other visual changes or other   focal motor or sensory complaints at this time.  Nevertheless, the patient has   been reportedly known to have a prior gastroparesis, all from uncontrolled   diabetes in the past.  The patient was given Reglan which she states is not   quite effect for this.  In any event, the patient presents here with evidence   of a DKA and a blood sugar in the 300 range and an anion gap of 21 noted.    PAST MEDICAL HISTORY:   1.  Prior diabetic ketoacidosis in the past with the last episode on May 11, 2012.  2.  History of diabetic gastroparesis with intractable nausea and vomiting.  3.  Prior cesarean section.    FAMILY HISTORY:  Positive for diabetes in family members.    SOCIAL HISTORY:  Denies tobacco, alcohol or drug use.    REVIEW OF SYSTEMS:  A 10-review of systems was negative except as in the HPI.    ALLERGIES:  PENICILLIN.    MEDICATIONS ON ADMISSION:  1.  Reglan 10 mg 4 times a day.   2  Insulin pump, having not changed the settings.    PHYSICAL EXAMINATION:  VITAL SIGNS:  Temperature 98.5, pulse 110, respiratory rate 20, blood pressure   154/92, pulse ox 99% on room air.  EYES:  Pupils equal and reactive to light.  Extraocular muscles are intact.  HEAD:  Atraumatic, normocephalic.  NECK:  Supple, no JVD.  No carotid bruits auscultated.  HEART:  S1, S2, no murmurs, tachycardic.  RESPIRATORY:  Lungs are clear to auscultation bilaterally without rales,   rhonchi or wheezes audible.  GASTROINTESTINAL:  Soft, somewhat scaphoid in shape and  diffusely tender with   minimal guarding, no rigidity.  Bowel sounds are somewhat hypoactive in most   quadrants.  EXTREMITIES:  Lower extremities show no cyanosis or clubbing but trace edema   bilaterally.  NEUROLOGIC:  Grossly nonfocal.  Cranial nerves II-XII intact.  No motor or   sensory deficit elicited.  MUSCULOSKELETAL:  Adequate range of motion all extremities.  PSYCHIATRIC:  Alert and oriented times 3.  EARS:  No visible external auditory canal drainage.  No lesions evidence.   ORAL CAVITY:  No oral lesions present at this time.  GENERAL:  This is a female appearing of stated age with ongoing retrosternal   burning chest pain and intermittent nausea but not in any evidently cardiac or   respiratory distress at this time.    LABORATORY DATA:  ABG shows pH 7.30, pCO2 29, pO2 147, bicarbonate 14.  O2 sat of 99% on room    air.  Sodium 136, potassium 5.6, chloride 107, bicarbonate 15.  Glucose 300.    BUN 17, creatinine 0.7.  Ionized calcium is 4.5.  A WBC count is 17.1,   hemoglobin 14.5, hematocrit 42.7.  Platelets are 278 and no bands.  A lipase   is 44, and her AST is 22. ALT is 21.  Alkaline phosphatase is 148.  Total   bilirubin is 0.6.  A chest x-ray shows no acute cardiopulmonary process.  An EKG shows sinus tachycardia, rate of 108 with no ST-T abnormalities.    IMPRESSION:  This is a kindly 27 year old who has known longstanding type 1 diabetes on an   insulin pump of which she is followed by an endocrinologist, although having   not seen one in a little while who, nevertheless, over the last 4 days, had   worsening intractable abdominal pain and subsequent nausea and vomiting,   having not eaten anything in the last day and who reports her blood sugars on   average to be at the mid 250 range throughout the afternoon and into the night   while her morning blood sugars are in the 100 to 130 range.    PLAN:  1.  Acute diabetic ketoacidosis.  Would certainly continue insulin drip at   this time and keep her insulin pump off at this moment with a check of    hemoglobin A1c and a recommendation initially to start Lantus at 10 to 15   units in the morning while reconnecting her insulin pump and using that    combination until she is seen by an endocrinologist as an outpatient.  We will   discontinue her insulin drip when able to do so and continue aggressive   intravenous fluid resuscitation.  2.  Probable acute gastritis.  Would give in addition to intravenous Protonix,   sucralfate slurry in a 4 times a day fashion and continuing her Reglan   empirically but, more importantly, getting her blood sugars under better   control.  The patient most likely should continue Protonix orally upon   discharge for at least 2 weeks. If her symptoms were to reoccur, she would   need to get an electroencephalogram evaluation.   2.  Diabetic gastroparesis.  Again, I would recommend continuing Reglan and   keeping her blood sugars under tight control.  3.  Prophylaxis with Protonix and lower extremity compression devices.    Total time spent 35 minutes.    PRIMARY CARE:  None.      ___________________  Elenor Quinones MD  Dictated By: .   TE  D:09/16/2012 22:54:16  T: 09/16/2012 23:45:45  [id number]  Authenticated by Aundria Mems, MD On 09/17/2012 10:19:29 PM

## 2012-09-17 LAB — GLUCOSE, POC
Glucose (POC): 192 mg/dL — ABNORMAL HIGH (ref 65–105)
Glucose (POC): 170 mg/dL — ABNORMAL HIGH (ref 65–105)
Glucose (POC): 219 mg/dL — ABNORMAL HIGH (ref 65–105)
Glucose (POC): 236 mg/dL — ABNORMAL HIGH (ref 65–105)
Glucose (POC): 175 mg/dL — ABNORMAL HIGH (ref 65–105)
Glucose (POC): 217 mg/dL — ABNORMAL HIGH (ref 65–105)
Glucose (POC): 193 mg/dL — ABNORMAL HIGH (ref 65–105)
Glucose (POC): 225 mg/dL — ABNORMAL HIGH (ref 65–105)
Glucose (POC): 175 mg/dL — ABNORMAL HIGH (ref 65–105)
Glucose (POC): 166 mg/dL — ABNORMAL HIGH (ref 65–105)
Glucose (POC): 143 mg/dL — ABNORMAL HIGH (ref 65–105)

## 2012-09-17 LAB — CBC WITH AUTOMATED DIFF
BASOPHILS: 0.4 % (ref 0–3)
EOSINOPHILS: 0 % (ref 0–5)
HCT: 37.6 % (ref 37.0–50.0)
HGB: 12.8 gm/dl — ABNORMAL LOW (ref 13.0–17.2)
IMMATURE GRANULOCYTES: 0.4 % — ABNORMAL HIGH (ref 0.00–0.00)
LYMPHOCYTES: 16.4 % — ABNORMAL LOW (ref 28–48)
MCH: 30.3 pg (ref 25.4–34.6)
MCHC: 34 gm/dl (ref 30.0–36.0)
MCV: 89.1 fL (ref 80.0–98.0)
MONOCYTES: 4.9 % (ref 1–13)
MPV: 12.9 fL — ABNORMAL HIGH (ref 6.0–10.0)
NEUTROPHILS: 77.9 % — ABNORMAL HIGH (ref 34–64)
NRBC: 0 (ref 0–0)
PLATELET: 260 10*3/uL (ref 140–450)
RBC: 4.22 M/uL (ref 3.60–5.20)
RDW-SD: 42.3 (ref 36.4–46.3)
WBC: 16 10*3/uL — ABNORMAL HIGH (ref 4.0–11.0)

## 2012-09-17 LAB — METABOLIC PANEL, COMPREHENSIVE
ALT (SGPT): 13 U/L (ref 12–78)
AST (SGOT): 9 U/L — ABNORMAL LOW (ref 15–37)
Albumin: 3.3 gm/dl — ABNORMAL LOW (ref 3.4–5.0)
Alk. phosphatase: 127 U/L (ref 50–136)
BUN: 10 mg/dl (ref 7–25)
Bilirubin, total: 0.8 mg/dl (ref 0.2–1.0)
CO2: 17 mEq/L — ABNORMAL LOW (ref 21–32)
Calcium: 8.9 mg/dl (ref 8.5–10.1)
Chloride: 105 mEq/L (ref 98–107)
Creatinine: 0.7 mg/dl (ref 0.6–1.3)
GFR est AA: >60
GFR est non-AA: >60
Glucose: 211 mg/dl — ABNORMAL HIGH (ref 74–106)
Potassium: 4 mEq/L (ref 3.5–5.1)
Protein, total: 7.5 gm/dl (ref 6.4–8.2)
Sodium: 137 mEq/L (ref 136–145)

## 2012-09-17 LAB — TSH 3RD GENERATION: TSH: 0.451 u[IU]/mL (ref 0.358–3.740)

## 2012-09-17 LAB — HEMOGLOBIN A1C WITH EAG: Hemoglobin A1c: 8.9 % — ABNORMAL HIGH (ref 4.8–6.0)

## 2012-09-17 LAB — ELECTROLYTES
CO2: 18 mEq/L — ABNORMAL LOW (ref 21–32)
Chloride: 104 mEq/L (ref 98–107)
Potassium: 3.8 mEq/L (ref 3.5–5.1)
Sodium: 134 mEq/L — ABNORMAL LOW (ref 136–145)

## 2012-09-17 LAB — PHOSPHORUS: Phosphorus: 3.1 mg/dl (ref 2.5–4.9)

## 2012-09-17 LAB — T4, FREE: Free T4: 1.09 ng/dl (ref 0.76–1.46)

## 2012-09-17 LAB — MAGNESIUM: Magnesium: 1.6 mg/dl — ABNORMAL LOW (ref 1.8–2.4)

## 2012-09-17 NOTE — ED Provider Notes (Signed)
[facility]  EMERGENCY DEPARTMENT TREATMENT REPORT  NAME:  [name], [place]  SEX:   F  ADMIT: 09/16/2012  DOB:   09/06/1985  MR#    [id number]  ROOM:  5215  TIME SEEN: 08 14 PM  ACCT#  [id number]    cc: Myriam Forehand M.D.    I HEREBY CERTIFY THIS PATIENT FOR ADMISSION BASED UPON MEDICAL NECESSITY AS  NOTED BELOW.     CHIEF COMPLAINT:  Elevated glucose, vomiting.    HISTORY OF PRESENT ILLNESS:  This is a 27 year old female who started vomiting last night, greater than 30  times.  It  was clear but then looked more brownish today.  Denies any melena  or hematochezia.  She has been having normal bowel movements.  She has had no  fever, no cough, no dysuria.  She does not have periods because she is on  Depo-Provera but does not think she is pregnant.  She denies any abdominal  pain.  She says that after she vomited a lot, she developed some midline chest  pain but only with vomiting.  It is not exertional, she has no palpitations,   and she is not short of breath.  She says she has gotten this type of pain   before when she vomits uncontrollably.  She did not try anything for her   symptoms, but, last night, noticed that her blood sugar was high in the 400s.      She gave herself a small bolus via her pump which she has been wearing and,   today, could not stop vomiting, so came in.  Nothing makes her symptoms better     and nothing makes them worse.    REVIEW OF SYSTEMS:  CONSTITUTIONAL:  No fever, chills, or weight loss.   EYES:   No visual symptoms.   ENT:  No sore throat, runny nose, or other URI symptoms.   RESPIRATORY:  No cough, shortness of breath, or wheezing.   CARDIAC:  As above.  GASTROINTESTINAL  As above.  GENITOURINARY:  No dysuria, frequency, or urgency.   MUSCULOSKELETAL:  No joint pain or swelling.   INTEGUMENTARY:  No rashes.   NEUROLOGICAL:  No headaches, sensory or motor symptoms.     PRIMARY CARE PHYSICIAN:  No PCP.    ENDOCRINOLOGIST:  Dr.  Council Mechanic.    PAST MEDICAL HISTORY:   Type 1 diabetes, gastroparesis, DKA.    SURGICAL HISTORY:  C-section.    SOCIAL HISTORY:  Does not smoke or drink.    FAMILY HISTORY:  Noncontributory.    ALLERGIES:  PENICILLIN GIVES HER HIVES.    CURRENT MEDICATIONS:  HUMALOG AND REGLAN.    PHYSICAL EXAMINATION:  GENERAL:  The patient is vomiting and heaving.    VITAL SIGNS:  Blood pressure 171/100, pulse 122, respiratory rate 20,  temperature 98.5, satting 99% on  room air.  GENERAL APPEARANCE:  Patient appears well developed and well nourished.   Appearance and behavior are age and situation appropriate.   EYES:  Conjunctivae clear, lids normal.  Pupils equal, symmetrical, and  normally reactive.   MOUTH:  Mucous membranes are dry.  NECK:  Supple, nontender.  LUNGS:  Clear and equal bilateral.  HEART:  Regular.  Tachycardic.  No murmurs.  CHEST:  Her chest wall hurts with palpation.  ABDOMEN:  Soft, minimal tenderness in the epigastrium.  No rebound, no  guarding.  No flank pain.  NEUROLOGIC:  Alert and oriented x3, moving all extremities well.  PSYCHIATRIC:  Recent and remote memory appear to be intact.   SKIN:  Warm and dry.    IMPRESSION AND PLAN:  This is a 27 year old female here with elevated glucose, vomiting, history of  diabetic ketoacidosis, chest pain only with vomiting.  We will evaluate her   and  get her medicated.    RESULTS:  Urine 500.  Glucose 80.  Ketones otherwise negative.  Urine pregnancy   negative.   Istat is likely hemolyzed with a potassium 5.6.  CBC with a white count of  17.1.  Hemoglobin and platelets are normal.  X-ray of the chest:  No acute  process.  Read by Dr. Allena Katz.  CMP:  Alkaline phosphatase 148, glucose 279,  bicarbonate 16.  Blood gas 7.30.  Bicarbonate on the blood gas is 14.  Acetone  blood small.  Lipase is 44. EKG showing a sinus tachycardia.  No ischemic  changes.    EMERGENCY ROOM COURSE:  The patient is in DKA likely very early and luckily has been wearing her    insulin pump. We took the pump off and put her on the DKA protocol insulin   infusion. She was given morphine for pain and the pain went away, a 2 liter   fluid bolus, 80mg  of Protonix total bolus, and then put her on the Protonix   drip given the dark vomit that she reported.  She was given Zofran as well.   She felt much better. She was discussed with Dr. Vilinda Blanks who has accepted her   to the regular diabetic floor.    DIAGNOSES:  1.  Diabetic ketoacidosis.  2.  Vomiting.  3.  History of gastroparesis.  4.  Muscular chest wall pain.    Critical care time, excluding procedures, but including direct patient care,  reviewing medical records, evaluating results of diagnostic testing,  discussions with family members, and consulting with physicians:  45 minutes.      ___________________  Judeen Hammans Tsuchitani MD  Dictated By: Marland Kitchen     My signature above authenticates this document and my orders, the final  diagnosis (es), discharge prescription (s), and instructions in the [facility] record.    If you have any questions please contact [phone].    TE  D:09/16/2012 20:14:01  T: 09/17/2012 00:05:05  [id number]  Authenticated and Linus Orn by Tana Conch, MD On 09/18/12 2:17:40 AM

## 2012-09-18 LAB — CBC W/O DIFF
HCT: 37.9 % (ref 37.0–50.0)
HGB: 12.9 gm/dl — ABNORMAL LOW (ref 13.0–17.2)
MCH: 29.7 pg (ref 25.4–34.6)
MCHC: 34 gm/dl (ref 30.0–36.0)
MCV: 87.3 fL (ref 80.0–98.0)
MPV: 12.3 fL — ABNORMAL HIGH (ref 6.0–10.0)
PLATELET: 252 10*3/uL (ref 140–450)
RBC: 4.34 M/uL (ref 3.60–5.20)
RDW-SD: 39.7 (ref 36.4–46.3)
WBC: 10.4 10*3/uL (ref 4.0–11.0)

## 2012-09-18 LAB — GLUCOSE, POC
Glucose (POC): 186 mg/dL — ABNORMAL HIGH (ref 65–105)
Glucose (POC): 224 mg/dL — ABNORMAL HIGH (ref 65–105)
Glucose (POC): 188 mg/dL — ABNORMAL HIGH (ref 65–105)
Glucose (POC): 186 mg/dL — ABNORMAL HIGH (ref 65–105)
Glucose (POC): 187 mg/dL — ABNORMAL HIGH (ref 65–105)
Glucose (POC): 187 mg/dL — ABNORMAL HIGH (ref 65–105)
Glucose (POC): 200 mg/dL — ABNORMAL HIGH (ref 65–105)
Glucose (POC): 163 mg/dL — ABNORMAL HIGH (ref 65–105)
Glucose (POC): 281 mg/dL — ABNORMAL HIGH (ref 65–105)

## 2012-09-18 LAB — METABOLIC PANEL, BASIC
BUN: 4 mg/dl — ABNORMAL LOW (ref 7–25)
CO2: 21 mEq/L (ref 21–32)
Calcium: 8.6 mg/dl (ref 8.5–10.1)
Chloride: 102 mEq/L (ref 98–107)
Creatinine: 0.7 mg/dl (ref 0.6–1.3)
GFR est AA: >60
GFR est non-AA: >60
Glucose: 216 mg/dl — ABNORMAL HIGH (ref 74–106)
Potassium: 3.7 mEq/L (ref 3.5–5.1)
Sodium: 134 mEq/L — ABNORMAL LOW (ref 136–145)

## 2012-09-18 LAB — ELECTROLYTES
CO2: 19 mEq/L — ABNORMAL LOW (ref 21–32)
Chloride: 105 mEq/L (ref 98–107)
Potassium: 4.5 mEq/L (ref 3.5–5.1)
Sodium: 137 mEq/L (ref 136–145)

## 2012-09-18 LAB — MAGNESIUM: Magnesium: 1.7 mg/dl — ABNORMAL LOW (ref 1.8–2.4)

## 2012-09-18 LAB — PHOSPHORUS: Phosphorus: 2.4 mg/dl — ABNORMAL LOW (ref 2.5–4.9)

## 2012-09-19 LAB — URINALYSIS W/ RFLX MICROSCOPIC
Bilirubin: NEGATIVE
Blood: NEGATIVE
Glucose: NEGATIVE mg/dl
Ketone: 40 mg/dl — AB
Nitrites: NEGATIVE
Protein: 30 mg/dl — AB
Specific gravity: 1.025 (ref 1.005–1.030)
Urobilinogen: 1 mg/dl (ref 0.0–1.0)
pH (UA): 5.5 (ref 5.0–9.0)

## 2012-09-19 LAB — METABOLIC PANEL, BASIC
BUN: 5 mg/dl — ABNORMAL LOW (ref 7–25)
CO2: 24 mEq/L (ref 21–32)
Calcium: 9 mg/dl (ref 8.5–10.1)
Chloride: 102 mEq/L (ref 98–107)
Creatinine: 0.7 mg/dl (ref 0.6–1.3)
GFR est AA: >60
GFR est non-AA: >60
Glucose: 144 mg/dl — ABNORMAL HIGH (ref 74–106)
Potassium: 3.5 mEq/L (ref 3.5–5.1)
Sodium: 136 mEq/L (ref 136–145)

## 2012-09-19 LAB — POC URINE MICROSCOPIC

## 2012-09-19 LAB — CBC W/O DIFF
HCT: 42.3 % (ref 37.0–50.0)
HGB: 14.6 gm/dl (ref 13.0–17.2)
MCH: 30.2 pg (ref 25.4–34.6)
MCHC: 34.5 gm/dl (ref 30.0–36.0)
MCV: 87.4 fL (ref 80.0–98.0)
MPV: 11.8 fL — ABNORMAL HIGH (ref 6.0–10.0)
PLATELET: 276 10*3/uL (ref 140–450)
RBC: 4.84 M/uL (ref 3.60–5.20)
RDW-SD: 39.8 (ref 36.4–46.3)
WBC: 9.1 10*3/uL (ref 4.0–11.0)

## 2012-09-19 LAB — MAGNESIUM: Magnesium: 1.4 mg/dl — ABNORMAL LOW (ref 1.8–2.4)

## 2012-09-19 LAB — GLUCOSE, POC
Glucose (POC): 115 mg/dL — ABNORMAL HIGH (ref 65–105)
Glucose (POC): 110 mg/dL — ABNORMAL HIGH (ref 65–105)
Glucose (POC): 175 mg/dL — ABNORMAL HIGH (ref 65–105)
Glucose (POC): 83 mg/dL (ref 65–105)

## 2012-09-19 LAB — HCG QL SERUM: HCG, Ql.: NEGATIVE

## 2012-09-20 LAB — GLUCOSE, POC
Glucose (POC): 208 mg/dL — ABNORMAL HIGH (ref 65–105)
Glucose (POC): 117 mg/dL — ABNORMAL HIGH (ref 65–105)
Glucose (POC): 126 mg/dL — ABNORMAL HIGH (ref 65–105)
Glucose (POC): 126 mg/dL — ABNORMAL HIGH (ref 65–105)

## 2012-10-21 LAB — BLOOD GAS, ARTERIAL
BASE EXCESS: -8 mmol/L — ABNORMAL LOW (ref <=3)
BICARBONATE: 15.3 mmol/L — ABNORMAL LOW (ref 22.0–26.0)
CO2, TOTAL: 16 mmol/L — ABNORMAL LOW (ref 21–32)
FIO2: 21
O2 SAT: 99 % — ABNORMAL HIGH (ref 70–75)
PCO2: 25 mm Hg — ABNORMAL LOW (ref 41.0–51.0)
PO2: 128.5 mm Hg — ABNORMAL HIGH (ref 35–40)
Patient temp.: 98
Performed by: 96566
pH: 7.393 (ref 7.310–7.410)

## 2012-10-21 LAB — POC URINE MACROSCOPIC
Bilirubin: NEGATIVE
Blood: NEGATIVE
Glucose: 500 mg/dl — AB
Ketone: >=160 mg/dl — AB
Leukocyte Esterase: NEGATIVE
Nitrites: NEGATIVE
Protein: 30 mg/dl — AB
Specific gravity: 1.02 (ref 1.005–1.030)
Urobilinogen: 1 EU/dl (ref 0.0–1.0)
pH (UA): 7 (ref 5–9)

## 2012-10-21 LAB — METABOLIC PANEL, COMPREHENSIVE
ALT (SGPT): 16 U/L (ref 12–78)
AST (SGOT): 17 U/L (ref 15–37)
Albumin: 4 gm/dl (ref 3.4–5.0)
Alk. phosphatase: 135 U/L (ref 50–136)
BUN: 17 mg/dl (ref 7–25)
Bilirubin, total: 1 mg/dl (ref 0.2–1.0)
CO2: 19 mEq/L — ABNORMAL LOW (ref 21–32)
Calcium: 9.8 mg/dl (ref 8.5–10.1)
Chloride: 103 mEq/L (ref 98–107)
Creatinine: 1 mg/dl (ref 0.6–1.3)
GFR est AA: >60
GFR est non-AA: >60
Glucose: 381 mg/dl — ABNORMAL HIGH (ref 74–106)
Potassium: 4 mEq/L (ref 3.5–5.1)
Protein, total: 8.7 gm/dl — ABNORMAL HIGH (ref 6.4–8.2)
Sodium: 135 mEq/L — ABNORMAL LOW (ref 136–145)

## 2012-10-21 LAB — GLUCOSE, POC
Glucose (POC): 331 mg/dL — ABNORMAL HIGH (ref 65–105)
Glucose (POC): 309 mg/dL — ABNORMAL HIGH (ref 65–105)
Glucose (POC): 200 mg/dL — ABNORMAL HIGH (ref 65–105)

## 2012-10-21 LAB — CBC WITH AUTOMATED DIFF
BASOPHILS: 0.5 % (ref 0–3)
EOSINOPHILS: 0 % (ref 0–5)
HCT: 41.5 % (ref 37.0–50.0)
HGB: 13.8 gm/dl (ref 13.0–17.2)
IMMATURE GRANULOCYTES: 0.5 % (ref 0.0–3.0)
LYMPHOCYTES: 5.5 % — ABNORMAL LOW (ref 28–48)
MCH: 30.1 pg (ref 25.4–34.6)
MCHC: 33.3 gm/dl (ref 30.0–36.0)
MCV: 90.6 fL (ref 80.0–98.0)
MONOCYTES: 1.1 % (ref 1–13)
MPV: 12.4 fL — ABNORMAL HIGH (ref 6.0–10.0)
NEUTROPHILS: 92.4 % — ABNORMAL HIGH (ref 34–64)
NRBC: 0 (ref 0–0)
PLATELET: 267 10*3/uL (ref 140–450)
RBC: 4.58 M/uL (ref 3.60–5.20)
RDW-SD: 41.3 (ref 36.4–46.3)
WBC: 16.5 10*3/uL — ABNORMAL HIGH (ref 4.0–11.0)

## 2012-10-21 LAB — POC HCG,URINE: HCG urine, QL: NEGATIVE

## 2012-10-21 LAB — LIPASE: Lipase: 59 U/L — ABNORMAL LOW (ref 73–393)

## 2012-10-21 NOTE — ED Provider Notes (Signed)
[facility]  EMERGENCY DEPARTMENT TREATMENT REPORT  NAME:  [place], [place]  SEX:   F  ADMIT: 10/21/2012  DOB:   Jan 03, 1986  MR#    [id number]  ROOM:  5211  TIME DICTATED: 05 20 PM  ACCT#  [id number]        I hereby certify this patient for admission based upon medical necessity as   noted below:      PRIMARY CARE PHYSICIAN:  Dr. Council Mechanic    CHIEF COMPLAINT:  Nausea, vomiting, abdominal pain.    HISTORY OF PRESENT ILLNESS:  This is a 27 year old female with a history of type 1 diabetes which been   uncontrolled as well as gastroparesis who today has had generalized abdominal   pain radiating into her chest as well as nausea and multiple episodes of   vomiting.  She states these are similar symptoms she has with DKA as well as   gastroparesis.  She denies having any vomiting, no fevers or chills.  Her   glucose has been in the 300s recently.  She wears an insulin pump.    REVIEW OF SYSTEMS:  CONSTITUTIONAL:  No fever or chills.  EYES:   No visual symptoms.  ENT:  No sore throat, runny nose, or other URI symptoms.  RESPIRATORY:  No cough, shortness of breath, or wheezing.  CARDIOVASCULAR:  No chest pain, chest pressure, or palpitations.  GASTROINTESTINAL:  Abdominal pain, nausea, vomiting.  No diarrhea.  GENITOURINARY:  No dysuria, frequency, or urgency.  MUSCULOSKELETAL:  No joint pain or swelling.  INTEGUMENTARY:  No rashes.  NEUROLOGICAL:  No headaches, sensory or motor symptoms.    PAST MEDICAL HISTORY:  Type 1 diabetes, gastroparesis, DKA, C-section.    SOCIAL HISTORY:  No tobacco, social alcohol twice a month.    FAMILY HISTORY:  None.    ALLERGIES:  MULTIPLE AND REVIEWED IN IBEX.    MEDICATIONS:  Multiple and reviewed in Ibex.    PHYSICAL EXAMINATION:  VITAL SIGNS:  Blood pressure 169/90, pulse 96, respiratory rate 12,   temperature 97.9, oxygen 100% on room air.  GENERAL APPEARANCE:  The patient appears uncomfortable, but otherwise well   developed.     HEENT:  Eyes:  Conjunctivae clear, lids normal.  Pupils equal, symmetrical,   and normally reactive.  Mouth/Throat:  Surfaces of the pharynx, palate, and   tongue are pink, moist, and without lesions.  NECK:  Supple, nontender.  LYMPHATIC:  No cervical or submandibular lymphadenopathy palpated.   RESPIRATORY:  Clear and equal breath sounds.  No respiratory distress,   tachypnea, or accessory muscle use.    CARDIOVASCULAR:   Heart regular, without murmurs, gallops, rubs, or thrills.    CHEST:  Chest symmetrical without masses or tenderness.  GASTROINTESTINAL:  Abdomen soft, nondistended, nontender.  Active bowel   sounds.  MUSCULOSKELETAL:  Stance and gait appear normal.  EXTREMITIES:  Normal exam without edema or deformity.     SKIN:  Warm and dry without rashes.    CONTINUATION BY Beck Cofer Wilfrid Lund, MD:      ASSESSMENT:   A 27 year old female with nausea, vomiting, abdominal pain, tachycardic, sinus   tachycardia on the monitor in the 120s likely diabetic gastroparesis.  Looks   clinically somewhat dry.    DIAGNOSTIC STUDIES:  Glucose was 381, BUN and creatinine were 17 and 1.0 respectively.  Bicarbonate   was somewhat low at 19.  Blood gas showed a pH of 7.393, pCO2 of 25.0, pO2   128.5, total CO2  of 16.  White count was elevated at 16.5 thousand.  Lipase   59.  Liver function tests unremarkable.  Hemoglobin and hematocrit of 13.8 and   41.5 respectively.  Urine positive only for glucose, otherwise negative for   nitrites and leukocytes, ketones greater than 160 on the urine.  Pregnancy   test was negative.    EMERGENCY DEPARTMENT COURSE:  The patient was given IV fluid hydration and started on insulin drip.  On   reevaluation, the patient had continued abdominal pain, still was tachycardic   in the 120s, sinus tachycardia on the monitor.  This case was discussed with   Dr. Terrace Arabia.  The patient will be admitted to a regular medical bed for further   treatment and evaluation.    FINAL DIAGNOSES:   1.  Acute abdominal pain and emesis.  2.  Acute diabetic gastroparesis.  3.  Uncontrolled diabetes mellitus.    DISPOSITION:  The patient was admitted in stable condition.      ___________________  Erlinda Hong MD  Dictated By: Caprice Renshaw E. Mertie Moores, PA-C    My signature above authenticates this document and my orders, the final  diagnosis (es), discharge prescription (s), and instructions in the [facility]   Pul[facility]ck record.  Nursing notes have been reviewed by the physician/mid-level provider.    If you have any questions please contact [phone].    LH  D:10/21/2012 17:20:38  T: 10/21/2012 21:39:06  [id number]  Authenticated by Tawanna Cooler L. Wilfrid Lund, M.D. On 10/23/2012 09:52:41 AM

## 2012-10-21 NOTE — H&P (Signed)
[facility]  History and Physical  NAME:  [name], [name] [name]  SEX:   F  ADMIT: 10/21/2012  DOB:03-18-1985  MR#    [id number]  ROOM:  5211  ACCT#  [id number]    I hereby certify this patient for admission based upon medical necessity as   noted below:    <    CHIEF COMPLAINT:  Nausea, vomiting and abdominal pain.    HISTORY OF PRESENT ILLNESS:  The patient is a 27 year old female with a history significant for diabetes   type 1 on insulin pump, history of diabetic gastroparesis, history of   recurrent DKA, presented with a complaint of nausea, vomiting and abdominal   pain.  The patient recently was hospitalized here from 08/21 to 09/20/2012 for   acute DKA and discharged home in stable condition.  The patient reported she   did not feel well since yesterday, decreased p.o. intake and this morning when   she got up and nausea and vomiting and cannot hold anything down and also   associated left lower quadrant abdominal pain, so the patient came to the   emergency room to be evaluated.  In the emergency room, the patient had   evidence of dehydration and also elevated glucose, persistent nausea and   vomiting and not responsive to the initially treatment.  Plan to admit the   patient to the hospital for persistent nausea, vomiting and abdominal pain.    The patient had similar abdominal pain last admission and was supposed to see   a GI doctor for obtaining a sigmoidoscopy, but the patient has not made the   appointment yet.  The patient denied any chest pain, no shortness of breath,   no fever, no chills.    PAST MEDICAL HISTORY:  Diabetes type 1 and diabetic gastroparesis and recurrent DKA.    ALLERGIES:  PENICILLIN.    SOCIAL HISTORY:  No smoking, no alcohol.  Denied any drug abuse.    FAMILY HISTORY:  Noncontributory.    MEDICATIONS AT HOME:  Insulin pump and Reglan p.r.n.    REVIEW OF SYSTEMS:  No fever, chills, no headache, no blurry vision, no hearing difficulty, no    oral lesions, no neck stiffness.  No chest pain, no palpitations, no cough, no   hemoptysis, no shortness of breath.  Positive for nausea, vomiting, and   abdominal pain as mentioned above.  No polyuria or polydipsia.  No skin rash   or mass. No easy bleeding or bruise.  No seizures.  No focal weakness.  No   hallucinations.    PHYSICAL EXAMINATION:  GENERAL APPEARANCE:  Pleasant lady without distress.  VITAL SIGNS:  Blood pressure 126/84, heart rate 92, respiratory 20,   temperature 98.5, O2 sat 96% on room air.  HEENT:  Pupils equal, round, reactive to light.  NECK:  Supple, no JVD, no thyromegaly, no carotid artery bruits.  CHEST:  Clear to auscultation.  No wheezing.  HEART:  S1, S2 regular rate and rhythm, no murmur or gallop.  ABDOMEN:  Soft, bowel sounds present.  Left lower quadrant tenderness. No   rebound.  EXTREMITIES:  No edema, no clubbing.  NEUROLOGIC:  Alert, oriented times 3 with no focal deficit.    LABORATORY TESTS:  WBC 16.5, hemoglobin and hematocrit 13.8 and 41.5 with platelets of 267.  BMP:    Sodium is 135, potassium 4, chloride 103, bicarbonate 19, BUN is 17,   creatinine is 1, glucose of 381.  Lipase is 59.  Liver function tests:  AST   70, ALT is 15, bilirubin 1, total protein 8.7, albumin 4.  ABG:  PH 7.39, pCO2   25, pO2 is 28.    ASSESSMENT AND PLAN:  1.  Diabetic gastroparesis.  The patient has had recurrent nausea and   vomiting.  History of gastroparesis.  Recurrent admissions for a similar   episode.  We are going to keep nothing by mouth and continue intravenous   fluids hydration and electrolyte replacement.  Continue Reglan.    Gastroenterology consult in the morning.           2.  Diabetes type 1 with hyperglycemia without evidence of diabetic   ketoacidosis.  We will continue her insulin pump and check a hemoglobin A1c   and monitor sugars closely.  If her glucose is not controlled, may switch her   to intravenous insulin.      3.  Abdominal pain.  The patient had a similar episode a week ago, supposed to   see gastroenterology for sigmoidoscopy, but the patient never made the   appointment.  We will get a gastroenterology consult in the morning.  4.  Deep venous thrombosis prophylaxis.      ___________________  Rosemary Holms MD  Dictated By: .   [facility]  D:10/21/2012 18:51:01  T: 10/21/2012 84:13:24  [id number]  Authenticated by Rosemary Holms, MD On 11/09/2012 01:48:28 PM

## 2012-10-22 LAB — METABOLIC PANEL, BASIC
BUN: 13 mg/dl (ref 7–25)
CO2: 17 mEq/L — ABNORMAL LOW (ref 21–32)
Calcium: 8.9 mg/dl (ref 8.5–10.1)
Chloride: 106 mEq/L (ref 98–107)
Creatinine: 0.7 mg/dl (ref 0.6–1.3)
GFR est AA: >60
GFR est non-AA: >60
Glucose: 248 mg/dl — ABNORMAL HIGH (ref 74–106)
Potassium: 4.2 mEq/L (ref 3.5–5.1)
Sodium: 137 mEq/L (ref 136–145)

## 2012-10-22 LAB — URINALYSIS W/ RFLX MICROSCOPIC
Bilirubin: NEGATIVE
Blood: NEGATIVE
Glucose: >=1000 mg/dl — AB
Ketone: >=80 mg/dl — AB
Leukocyte Esterase: NEGATIVE
Nitrites: NEGATIVE
Protein: NEGATIVE mg/dl
Specific gravity: 1.02 (ref 1.005–1.030)
Urobilinogen: 1 mg/dl (ref 0.0–1.0)
pH (UA): 6 (ref 5.0–9.0)

## 2012-10-22 LAB — CBC WITH AUTOMATED DIFF
BASOPHILS: 0.1 % (ref 0–3)
EOSINOPHILS: 0 % (ref 0–5)
HCT: 39 % (ref 37.0–50.0)
HGB: 12.9 gm/dl — ABNORMAL LOW (ref 13.0–17.2)
IMMATURE GRANULOCYTES: 0.4 % (ref 0.0–3.0)
LYMPHOCYTES: 11.8 % — ABNORMAL LOW (ref 28–48)
MCH: 30 pg (ref 25.4–34.6)
MCHC: 33.1 gm/dl (ref 30.0–36.0)
MCV: 90.7 fL (ref 80.0–98.0)
MONOCYTES: 3.2 % (ref 1–13)
MPV: 12.3 fL — ABNORMAL HIGH (ref 6.0–10.0)
NEUTROPHILS: 84.5 % — ABNORMAL HIGH (ref 34–64)
NRBC: 0 (ref 0–0)
PLATELET: 271 10*3/uL (ref 140–450)
RBC: 4.3 M/uL (ref 3.60–5.20)
RDW-SD: 41.9 (ref 36.4–46.3)
WBC: 16.3 10*3/uL — ABNORMAL HIGH (ref 4.0–11.0)

## 2012-10-22 LAB — GLUCOSE, POC
Glucose (POC): 122 mg/dL — ABNORMAL HIGH (ref 65–105)
Glucose (POC): 182 mg/dL — ABNORMAL HIGH (ref 65–105)
Glucose (POC): 183 mg/dL — ABNORMAL HIGH (ref 65–105)
Glucose (POC): 207 mg/dL — ABNORMAL HIGH (ref 65–105)
Glucose (POC): 224 mg/dL — ABNORMAL HIGH (ref 65–105)
Glucose (POC): 238 mg/dL — ABNORMAL HIGH (ref 65–105)
Glucose (POC): 249 mg/dL — ABNORMAL HIGH (ref 65–105)
Glucose (POC): 277 mg/dL — ABNORMAL HIGH (ref 65–105)
Glucose (POC): 280 mg/dL — ABNORMAL HIGH (ref 65–105)
Glucose (POC): 288 mg/dL — ABNORMAL HIGH (ref 65–105)
Glucose (POC): 323 mg/dL — ABNORMAL HIGH (ref 65–105)
Glucose (POC): 326 mg/dL — ABNORMAL HIGH (ref 65–105)
Glucose (POC): 92 mg/dL (ref 65–105)

## 2012-10-23 LAB — CBC WITH AUTOMATED DIFF
ATYPICAL LYMPHS: 2.9 % — ABNORMAL HIGH (ref 0–0)
BAND NEUTROPHILS: 1 % (ref 0–11)
HCT: 40 % (ref 37.0–50.0)
HGB: 13.3 gm/dl (ref 13.0–17.2)
LYMPHOCYTES: 3.8 % — ABNORMAL LOW (ref 28–48)
MCH: 29.8 pg (ref 25.4–34.6)
MCHC: 33.3 gm/dl (ref 30.0–36.0)
MCV: 89.7 fL (ref 80.0–98.0)
MONOCYTES: 8.7 % (ref 1–13)
MPV: 12.4 fL — ABNORMAL HIGH (ref 6.0–10.0)
NEUTROPHILS: 83.7 % — ABNORMAL HIGH (ref 34–64)
PLATELET COMMENTS: NORMAL
PLATELET: 327 10*3/uL (ref 140–450)
RBC Morphology: NORMAL
RBC: 4.46 M/uL (ref 3.60–5.20)
RDW-SD: 41 (ref 36.4–46.3)
WBC: 23.5 10*3/uL — ABNORMAL HIGH (ref 4.0–11.0)

## 2012-10-23 LAB — GLUCOSE, POC
Glucose (POC): 250 mg/dL — ABNORMAL HIGH (ref 65–105)
Glucose (POC): 110 mg/dL — ABNORMAL HIGH (ref 65–105)
Glucose (POC): 215 mg/dL — ABNORMAL HIGH (ref 65–105)
Glucose (POC): 256 mg/dL — ABNORMAL HIGH (ref 65–105)
Glucose (POC): 115 mg/dL — ABNORMAL HIGH (ref 65–105)
Glucose (POC): 194 mg/dL — ABNORMAL HIGH (ref 65–105)
Glucose (POC): 219 mg/dL — ABNORMAL HIGH (ref 65–105)
Glucose (POC): 188 mg/dL — ABNORMAL HIGH (ref 65–105)
Glucose (POC): 199 mg/dL — ABNORMAL HIGH (ref 65–105)
Glucose (POC): 283 mg/dL — ABNORMAL HIGH (ref 65–105)
Glucose (POC): 152 mg/dL — ABNORMAL HIGH (ref 65–105)
Glucose (POC): 231 mg/dL — ABNORMAL HIGH (ref 65–105)

## 2012-10-24 LAB — CBC WITH AUTOMATED DIFF
BASOPHILS: 0.3 % (ref 0–3)
EOSINOPHILS: 0 % (ref 0–5)
HCT: 40.1 % (ref 37.0–50.0)
HGB: 13.3 gm/dl (ref 13.0–17.2)
IMMATURE GRANULOCYTES: 0.6 % (ref 0.0–3.0)
LYMPHOCYTES: 13.9 % — ABNORMAL LOW (ref 28–48)
MCH: 30 pg (ref 25.4–34.6)
MCHC: 33.2 gm/dl (ref 30.0–36.0)
MCV: 90.3 fL (ref 80.0–98.0)
MONOCYTES: 3.6 % (ref 1–13)
MPV: 12 fL — ABNORMAL HIGH (ref 6.0–10.0)
NEUTROPHILS: 81.6 % — ABNORMAL HIGH (ref 34–64)
NRBC: 0 (ref 0–0)
PLATELET: 313 10*3/uL (ref 140–450)
RBC: 4.44 M/uL (ref 3.60–5.20)
RDW-SD: 41.5 (ref 36.4–46.3)
WBC: 17.5 10*3/uL — ABNORMAL HIGH (ref 4.0–11.0)

## 2012-10-24 LAB — GLUCOSE, POC
Glucose (POC): 119 mg/dL — ABNORMAL HIGH (ref 65–105)
Glucose (POC): 132 mg/dL — ABNORMAL HIGH (ref 65–105)
Glucose (POC): 153 mg/dL — ABNORMAL HIGH (ref 65–105)
Glucose (POC): 154 mg/dL — ABNORMAL HIGH (ref 65–105)
Glucose (POC): 160 mg/dL — ABNORMAL HIGH (ref 65–105)
Glucose (POC): 173 mg/dL — ABNORMAL HIGH (ref 65–105)
Glucose (POC): 190 mg/dL — ABNORMAL HIGH (ref 65–105)
Glucose (POC): 196 mg/dL — ABNORMAL HIGH (ref 65–105)
Glucose (POC): 199 mg/dL — ABNORMAL HIGH (ref 65–105)
Glucose (POC): 211 mg/dL — ABNORMAL HIGH (ref 65–105)
Glucose (POC): 213 mg/dL — ABNORMAL HIGH (ref 65–105)
Glucose (POC): 288 mg/dL — ABNORMAL HIGH (ref 65–105)

## 2012-10-24 LAB — METABOLIC PANEL, BASIC
BUN: 9 mg/dl (ref 7–25)
CO2: 15 mEq/L — ABNORMAL LOW (ref 21–32)
Calcium: 9.1 mg/dl (ref 8.5–10.1)
Chloride: 101 mEq/L (ref 98–107)
Creatinine: 1 mg/dl (ref 0.6–1.3)
GFR est AA: >60
GFR est non-AA: >60
Glucose: 197 mg/dl — ABNORMAL HIGH (ref 74–106)
Potassium: 4 mEq/L (ref 3.5–5.1)
Sodium: 132 mEq/L — ABNORMAL LOW (ref 136–145)

## 2012-10-25 LAB — CBC WITH AUTOMATED DIFF
BASOPHILS: 0.6 % (ref 0–3)
EOSINOPHILS: 0 % (ref 0–5)
HCT: 35.2 % — ABNORMAL LOW (ref 37.0–50.0)
HGB: 12.1 gm/dl — ABNORMAL LOW (ref 13.0–17.2)
IMMATURE GRANULOCYTES: 0.5 % (ref 0.0–3.0)
LYMPHOCYTES: 31.5 % (ref 28–48)
MCH: 29.7 pg (ref 25.4–34.6)
MCHC: 34.4 gm/dl (ref 30.0–36.0)
MCV: 86.5 fL (ref 80.0–98.0)
MONOCYTES: 5.3 % (ref 1–13)
MPV: 11.4 fL — ABNORMAL HIGH (ref 6.0–10.0)
NEUTROPHILS: 62.1 % (ref 34–64)
NRBC: 0 (ref 0–0)
PLATELET: 276 10*3/uL (ref 140–450)
RBC: 4.07 M/uL (ref 3.60–5.20)
RDW-SD: 37.9 (ref 36.4–46.3)
WBC: 10.2 10*3/uL (ref 4.0–11.0)

## 2012-10-25 LAB — GLUCOSE, POC
Glucose (POC): 141 mg/dL — ABNORMAL HIGH (ref 65–105)
Glucose (POC): 153 mg/dL — ABNORMAL HIGH (ref 65–105)
Glucose (POC): 157 mg/dL — ABNORMAL HIGH (ref 65–105)
Glucose (POC): 171 mg/dL — ABNORMAL HIGH (ref 65–105)
Glucose (POC): 171 mg/dL — ABNORMAL HIGH (ref 65–105)
Glucose (POC): 172 mg/dL — ABNORMAL HIGH (ref 65–105)
Glucose (POC): 189 mg/dL — ABNORMAL HIGH (ref 65–105)
Glucose (POC): 192 mg/dL — ABNORMAL HIGH (ref 65–105)

## 2012-11-10 LAB — CBC WITH AUTOMATED DIFF
BASOPHILS: 0.4 % (ref 0–3)
EOSINOPHILS: 0 % (ref 0–5)
HCT: 39.4 % (ref 37.0–50.0)
HGB: 13.2 gm/dl (ref 13.0–17.2)
IMMATURE GRANULOCYTES: 0.2 % (ref 0.0–3.0)
LYMPHOCYTES: 9.2 % — ABNORMAL LOW (ref 28–48)
MCH: 30.3 pg (ref 25.4–34.6)
MCHC: 33.5 gm/dl (ref 30.0–36.0)
MCV: 90.6 fL (ref 80.0–98.0)
MONOCYTES: 2.9 % (ref 1–13)
MPV: 12.7 fL — ABNORMAL HIGH (ref 6.0–10.0)
NEUTROPHILS: 87.3 % — ABNORMAL HIGH (ref 34–64)
NRBC: 0 (ref 0–0)
PLATELET COMMENTS: NORMAL
PLATELET: 239 10*3/uL (ref 140–450)
RBC: 4.35 M/uL (ref 3.60–5.20)
RDW-SD: 42.1 (ref 36.4–46.3)
WBC: 15.6 10*3/uL — ABNORMAL HIGH (ref 4.0–11.0)

## 2012-11-10 LAB — POC URINE MACROSCOPIC
Bilirubin: NEGATIVE
Blood: NEGATIVE
Glucose: 500 mg/dl — AB
Ketone: 40 mg/dl — AB
Leukocyte Esterase: NEGATIVE
Nitrites: NEGATIVE
Protein: NEGATIVE mg/dl
Specific gravity: 1.02 (ref 1.005–1.030)
Urobilinogen: 0.2 EU/dl (ref 0.0–1.0)
pH (UA): 7 (ref 5–9)

## 2012-11-11 LAB — CBC WITH AUTOMATED DIFF
ATYPICAL LYMPHS: 1.9 % — ABNORMAL HIGH (ref 0–0)
HCT: 42.7 % (ref 37.0–50.0)
HGB: 14.3 gm/dl (ref 13.0–17.2)
LYMPHOCYTES: 5.7 % — ABNORMAL LOW (ref 28–48)
MCH: 30.3 pg (ref 25.4–34.6)
MCHC: 33.5 gm/dl (ref 30.0–36.0)
MCV: 90.5 fL (ref 80.0–98.0)
MONOCYTES: 1 % (ref 1–13)
MPV: 12.1 fL — ABNORMAL HIGH (ref 6.0–10.0)
NEUTROPHILS: 91.4 % — ABNORMAL HIGH (ref 34–64)
PLATELET COMMENTS: NORMAL
PLATELET: 281 10*3/uL (ref 140–450)
RBC Morphology: NORMAL
RBC: 4.72 M/uL (ref 3.60–5.20)
RDW-SD: 42.5 (ref 36.4–46.3)
WBC: 23.8 10*3/uL — ABNORMAL HIGH (ref 4.0–11.0)

## 2012-11-11 LAB — METABOLIC PANEL, COMPREHENSIVE
ALT (SGPT): 17 U/L (ref 12–78)
AST (SGOT): 15 U/L (ref 15–37)
Albumin: 4 gm/dl (ref 3.4–5.0)
Alk. phosphatase: 141 U/L — ABNORMAL HIGH (ref 50–136)
BUN: 27 mg/dl — ABNORMAL HIGH (ref 7–25)
Bilirubin, total: 1.1 mg/dl — ABNORMAL HIGH (ref 0.2–1.0)
CO2: 18 mEq/L — ABNORMAL LOW (ref 21–32)
Calcium: 9.6 mg/dl (ref 8.5–10.1)
Chloride: 101 mEq/L (ref 98–107)
Creatinine: 1.5 mg/dl — ABNORMAL HIGH (ref 0.6–1.3)
GFR est AA: 54
GFR est non-AA: 44
Glucose: 376 mg/dl — ABNORMAL HIGH (ref 74–106)
Potassium: 4.2 mEq/L (ref 3.5–5.1)
Protein, total: 8.8 gm/dl — ABNORMAL HIGH (ref 6.4–8.2)
Sodium: 134 mEq/L — ABNORMAL LOW (ref 136–145)

## 2012-11-11 LAB — POC CHEM8
Anion gap: 21 — ABNORMAL HIGH (ref 10–20)
BUN: 31 mg/dl — ABNORMAL HIGH (ref 7–25)
CALCIUM,IONIZED: 4.8 mg/dl (ref 4.40–5.40)
CO2, TOTAL: 16 mmol/L — ABNORMAL LOW (ref 21–32)
Chloride: 105 mEq/L (ref 98–107)
Creatinine: 1.3 mg/dl (ref 0.6–1.3)
Glucose: 382 mg/dl — ABNORMAL HIGH (ref 74–106)
HCT: 46 % — ABNORMAL HIGH (ref 38–45)
HGB: 15.6 gm/dl (ref 13.0–17.2)
Potassium: 5 mEq/L — ABNORMAL HIGH (ref 3.5–4.9)
Sodium: 136 mEq/L (ref 136–145)

## 2012-11-11 LAB — BLOOD GAS, ARTERIAL
BASE EXCESS: -9 mmol/L — ABNORMAL LOW (ref <=3)
BICARBONATE: 16.4 mmol/L — ABNORMAL LOW (ref 18.0–26.0)
CO2, TOTAL: 17.4 mmol/L — ABNORMAL LOW (ref 24–29)
FIO2: 21
O2 SAT: 96.2 % (ref 90–100)
PCO2: 32.3 mm Hg — ABNORMAL LOW (ref 35.0–45.0)
PO2: 88 mm Hg (ref 75–100)
Patient temp.: 98
Performed by: 96566
pH: 7.311 — ABNORMAL LOW (ref 7.350–7.450)

## 2012-11-11 LAB — POC URINE MACROSCOPIC
Glucose: 500 mg/dl — AB
Ketone: 80 mg/dl — AB
Leukocyte Esterase: NEGATIVE
Nitrites: NEGATIVE
Protein: 100 mg/dl — AB
Specific gravity: 1.025 (ref 1.005–1.030)
Urobilinogen: 0.2 EU/dl (ref 0.0–1.0)
pH (UA): 5.5 (ref 5–9)

## 2012-11-11 LAB — GLUCOSE, POC: Glucose (POC): 334 mg/dL — ABNORMAL HIGH (ref 65–105)

## 2012-11-11 LAB — POC HCG,URINE: HCG urine, QL: NEGATIVE

## 2012-11-11 LAB — LIPASE: Lipase: 42 U/L — ABNORMAL LOW (ref 73–393)

## 2012-11-11 NOTE — ED Provider Notes (Signed)
[facility]  EMERGENCY DEPARTMENT TREATMENT REPORT  NAME:  [name] [name]  SEX:   F  ADMIT: 11/11/2012  DOB:   [birth year]  MR#    [id number]  ROOM:  XB14  TIME DICTATED: 05 10 PM  ACCT#  [id number]    cc: Mike Gip MD    TIME OF EVALUATION:   1706.    GASTROENTEROLOGIST:  Dr. Nolon Nations.    CHIEF COMPLAINT:  Vomiting and low back pain.    HISTORY OF PRESENT ILLNESS:  This is a 27 year old female who was evaluated in the Emergency Department  yesterday for acute gastroenteritis, possibly caused by food poisoning.  She  was rehydrated and discharged home.  The patient states that since the  discharge yesterday she has continued to have vomiting approximately 4 or 5  times since her discharge yesterday.  She has not been able to keep anything  down.  The ODT Zofran that she was given apparently is not working for her.   She had a previously scheduled appointment with her GI doctor that she made  this afternoon, however, when she arrived there she was tachycardic.  They  noticed that she was a little dehydrated and sent her over to the Emergency  Department for evaluation.  The patient is not really complaining of any  abdominal pain.  Most of her complaint relates to the back pain that she has  had.  She thinks it is more related probably to all the vomiting that she is  doing over the past couple of days.  She has never had this type of back pain  previously.  She describes it as an achy type of pain, 10 out of 10.  She has  not been taking anything for it today.  Her stomach she relates is about a 2  out of 10.  Also a crampy type of pain.  The pain her back, again, confined to  the lower back bilaterally.  It does not radiate around into her abdomen.    REVIEW OF SYSTEMS:  CONSTITUTIONAL:  No fever, chills, or weight loss.   RESPIRATORY:  No cough, shortness of breath, or wheezing.   CARDIOVASCULAR:  No chest pain, chest pressure, or palpitations.  GASTROINTESTINAL:  Positive for abdominal pain and vomiting.   INTEGUMENTARY:  No rashes.   NEUROLOGICAL:  No headaches, sensory or motor symptoms.    PAST MEDICAL HISTORY:  Gastroparesis, type 1 diabetes.    PAST SURGICAL HISTORY:  C-section.    FAMILY HISTORY:  Noncontributory in this case.    SOCIAL HISTORY:  The patient does not smoke.  Drinks socially.  Denies illicit drugs.    ALLERGIES:  PENICILLINS.    MEDICATIONS:  Humalog, Lantus and Reglan.    PHYSICAL EXAMINATION:  VITAL SIGNS:  Blood pressure is 157/95, pulse 130, respirations 20,   temperature  98.4.  She is saturating at 100% on room air.  GENERAL APPEARANCE:  A well-developed, well-nourished appearing 27 year old  female lying on the bed.  She does not appear that she can get comfortable,  stating that her back hurts.   MOUTH:  Oropharynx is nonerythematous, nonedematous, no exudate.  Her mucous  membranes do appear to be somewhat dry, as do her lips.   RESPIRATORY:  Clear and equal breath sounds.  No respiratory distress,  tachypnea, or accessory muscle use.    CARDIOVASCULAR:   Heart regular, without murmurs, gallops, rubs, or thrills.   GASTROINTESTINAL:  Her abdomen is soft.  It is  mildly tender in the  periumbilical/epigastric region.  No organomegaly.  No ecchymosis.  No other  masses.  MUSCULOSKELETAL:  The patient does have tenderness to palpation bilateral  paravertebral muscles in the lower lumbosacral area.  There is no bony   crepitus  along the midline, no ecchymosis, no hematoma.  SKIN:  Warm and dry without rashes.  NEUROLOGIC:  The patient is able to move all of her extremities normally.  Her  motor strength is symmetrical and equal bilaterally.  She has no loss of  sensation in her lower extremities or upper extremities.  No saddle  paresthesias.    CONTINUATION BY Rj Pedrosa TSUCHITANI, MD:    RESULTS:   Blood gas:  pH 7.311, pCO2 32, bicarbonate 16.4.  X-ray of the chest:  No  consolidation, read by Dr. Sherryll Burger.  CBC with a white count of 23.8, normal   hemoglobin and platelets.  CMP with a glucose of 376, bicarbonate 18, BUN 27,  creatinine 1.5, lipase 42, urine pregnancy negative.  Urine positive for  glucose, ketones, otherwise unremarkable.    ER COURSE/MEDICAL DECISION MAKING:  The patient is presenting in DKA.  She has had persistent vomiting, she is  tachycardic.  She complains of some back pain.  I think I pulled something  when I started vomiting.  Indeed, her pain is very reproducible over her right  SI joint with movement and palpation.  The patient was medicated and hydrated   with a bolus of NS. She was started on insulin drip per DKA protocol and she   started to feel better.  She was discussed with Dr. Earlie Lou, who has   accepted this patient to his service, inpatient telemetry.    DIAGNOSES:  Diabetic ketoacidosis.  Lumbar pain, musculoskeletal.  Vomiting.        Critical care time, excluding procedures, but including direct patient care,  reviewing medical records, evaluating results of diagnostic testing,  discussions with family members, and consulting with physicians: 35 minutes.      ___________________  Judeen Hammans Tsuchitani MD  Dictated By: Fransisca Kaufmann, PA-C    My signature above authenticates this document and my orders, the final  diagnosis (es), discharge prescription (s), and instructions in the [facility]  Pul[facility]heck record.  Nursing notes have been reviewed by the physician/mid-level provider.    If you have any questions please contact [phone].    JMB  D:11/11/2012 17:10:46  T: 11/11/2012 17:40:21  [id number]  Authenticated and Linus Orn by Tana Conch, MD On 11/12/12 6:23:32 PM

## 2012-11-12 LAB — GLUCOSE, POC
Glucose (POC): 156 mg/dL — ABNORMAL HIGH (ref 65–105)
Glucose (POC): 118 mg/dL — ABNORMAL HIGH (ref 65–105)
Glucose (POC): 145 mg/dL — ABNORMAL HIGH (ref 65–105)
Glucose (POC): 110 mg/dL — ABNORMAL HIGH (ref 65–105)
Glucose (POC): 361 mg/dL — ABNORMAL HIGH (ref 65–105)
Glucose (POC): 244 mg/dL — ABNORMAL HIGH (ref 65–105)
Glucose (POC): 111 mg/dL — ABNORMAL HIGH (ref 65–105)
Glucose (POC): 332 mg/dL — ABNORMAL HIGH (ref 65–105)
Glucose (POC): 226 mg/dL — ABNORMAL HIGH (ref 65–105)
Glucose (POC): 120 mg/dL — ABNORMAL HIGH (ref 65–105)
Glucose (POC): 248 mg/dL — ABNORMAL HIGH (ref 65–105)
Glucose (POC): 316 mg/dL — ABNORMAL HIGH (ref 65–105)

## 2012-11-12 LAB — CBC WITH AUTOMATED DIFF
BASOPHILS: 0.4 % (ref 0–3)
EOSINOPHILS: 0 % (ref 0–5)
HCT: 36.7 % — ABNORMAL LOW (ref 37.0–50.0)
HGB: 12.1 gm/dl — ABNORMAL LOW (ref 13.0–17.2)
IMMATURE GRANULOCYTES: 0.5 % (ref 0.0–3.0)
LYMPHOCYTES: 12 % — ABNORMAL LOW (ref 28–48)
MCH: 30.6 pg (ref 25.4–34.6)
MCHC: 33 gm/dl (ref 30.0–36.0)
MCV: 92.9 fL (ref 80.0–98.0)
MONOCYTES: 4.3 % (ref 1–13)
MPV: 11.7 fL — ABNORMAL HIGH (ref 6.0–10.0)
NEUTROPHILS: 82.8 % — ABNORMAL HIGH (ref 34–64)
NRBC: 0 (ref 0–0)
PLATELET: 254 10*3/uL (ref 140–450)
RBC: 3.95 M/uL (ref 3.60–5.20)
RDW-SD: 43.8 (ref 36.4–46.3)
WBC: 22.6 10*3/uL — ABNORMAL HIGH (ref 4.0–11.0)

## 2012-11-12 LAB — POC URINE MICROSCOPIC

## 2012-11-12 LAB — ELECTROLYTES
CO2: 17 mEq/L — ABNORMAL LOW (ref 21–32)
Chloride: 108 mEq/L — ABNORMAL HIGH (ref 98–107)
Potassium: 4.3 mEq/L (ref 3.5–5.1)
Sodium: 136 mEq/L (ref 136–145)

## 2012-11-12 LAB — METABOLIC PANEL, COMPREHENSIVE
ALT (SGPT): 14 U/L (ref 12–78)
AST (SGOT): 12 U/L — ABNORMAL LOW (ref 15–37)
Albumin: 3 gm/dl — ABNORMAL LOW (ref 3.4–5.0)
Alk. phosphatase: 101 U/L (ref 50–136)
BUN: 22 mg/dl (ref 7–25)
Bilirubin, total: 0.7 mg/dl (ref 0.2–1.0)
CO2: 18 mEq/L — ABNORMAL LOW (ref 21–32)
Calcium: 8 mg/dl — ABNORMAL LOW (ref 8.5–10.1)
Chloride: 113 mEq/L — ABNORMAL HIGH (ref 98–107)
Creatinine: 1 mg/dl (ref 0.6–1.3)
GFR est AA: >60
GFR est non-AA: >60
Glucose: 116 mg/dl — ABNORMAL HIGH (ref 74–106)
Potassium: 4.5 mEq/L (ref 3.5–5.1)
Protein, total: 6.7 gm/dl (ref 6.4–8.2)
Sodium: 140 mEq/L (ref 136–145)

## 2012-11-12 LAB — URINALYSIS W/ RFLX MICROSCOPIC
Bilirubin: NEGATIVE
Blood: NEGATIVE
Glucose: >=1000 mg/dl — AB
Ketone: >=80 mg/dl — AB
Leukocyte Esterase: NEGATIVE
Nitrites: NEGATIVE
Specific gravity: 1.025 (ref 1.005–1.030)
Urobilinogen: 0.2 mg/dl (ref 0.0–1.0)
pH (UA): 6 (ref 5.0–9.0)

## 2012-11-12 LAB — PROTHROMBIN TIME + INR
INR: 1.2 — ABNORMAL HIGH (ref 0.0–1.1)
Prothrombin time: 14.8 seconds — ABNORMAL HIGH (ref 11.5–14.0)

## 2012-11-12 LAB — CALCIUM: Calcium: 8.4 mg/dl — ABNORMAL LOW (ref 8.5–10.1)

## 2012-11-12 LAB — ACETONE/KETONE, QL

## 2012-11-12 LAB — MAGNESIUM: Magnesium: 1.5 mg/dl — ABNORMAL LOW (ref 1.8–2.4)

## 2012-11-12 LAB — CALCIUM, IONIZED: CALCIUM,IONIZED: 4.8 mg/dl (ref 4.4–5.4)

## 2012-11-12 LAB — LACTIC ACID: Lactic Acid: 1 mmol/L (ref 0.4–2.0)

## 2012-11-12 LAB — PTT: aPTT: 25.2 seconds (ref 24.9–35.6)

## 2012-11-12 LAB — T4, FREE: Free T4: 1.08 ng/dl (ref 0.76–1.46)

## 2012-11-12 LAB — LEGIONELLA PNEUMOPHILA AG, URINE: Legionella Ag, urine: NEGATIVE

## 2012-11-12 LAB — HEMOGLOBIN A1C WITH EAG: Hemoglobin A1c: 8.6 % — ABNORMAL HIGH (ref 4.8–6.0)

## 2012-11-12 LAB — PHOSPHORUS: Phosphorus: 3.6 mg/dl (ref 2.5–4.9)

## 2012-11-12 LAB — S.PNEUMO AG, UR/CSF: Strep pneumo Ag, urine: NEGATIVE

## 2012-11-12 LAB — PROCALCITONIN: PROCALCITONIN: 2.18 ng/ml — ABNORMAL HIGH (ref 0.00–0.50)

## 2012-11-12 LAB — TSH 3RD GENERATION: TSH: 1.12 u[IU]/mL (ref 0.358–3.740)

## 2012-11-12 NOTE — H&P (Signed)
[facility]  History and Physical  NAME:  [name], [name]  SEX:   F  ADMIT: 11/11/2012  DOB:1985/04/17  MR#    [id number]  ROOM:  2213  ACCT#  [id number]    I hereby certify this patient for admission based upon medical necessity as   noted below:    <    CHIEF COMPLAINT:  Nausea, vomiting, abdominal pain.    HISTORY OF PRESENT ILLNESS:  This is a 27 year old female with type 1 diabetes mellitus and gastroparesis   who presents with initially symptoms consistent for acute gastroenteritis.    The patient initially presented to the emergency room 1 day ago when she was   discharged and, subsequently, had 4 to 5 episodes of vomiting without any   relief with Zofran over-the-counter.  Subsequently, the patient had a visit at   her gastroenterologist who noticed that she was somewhat lethargic,   dehydrated, and tachycardic.  Subsequently, she was sent to the emergency room   for further evaluation.  In the ED, as she was found to be in DKA, was also   noted to have acute renal insufficiency, started on IV fluids, DKA protocol   and admitted to the telemetry floor for further management.  The patient also   describes back pain previously which is thought to be initially due to nausea   and vomiting.  However, she does have a severely elevated leukocyte, white   blood cell count, and we will look into infectious sources of pain.  She does   not have a history of back pain.  No history of IV drug use.  Her abdominal   pain is described as crampy, nonradiating, about 3 to 4 out of 10.  Denies any   fevers or chills, however, reports to feel cold.  No diarrhea, no chest pain,   no shortness of breath, no lightheadedness, dizziness.  No syncopal episodes.    No skin changes.  No rashes, no joint pain, no swollen lymph nodes.    REVIEW OF SYSTEMS:  A 12-point review of systems is negative except for what was noted in HPI.    PAST MEDICAL HISTORY:  Significant for type 1 diabetes and gastroparesis.     PAST SURGICAL HISTORY:  Significant for C-section.    SOCIAL HISTORY:  Does not smoke, occasionally uses alcoholic beverages.  Does not use any   illicit or IV drugs.    FAMILY HISTORY:  Reviewed and noncontributory in this case.    ALLERGIES:   PENICILLINS.    CURRENT MEDICATIONS:  Humalog, Lantus, Reglan .    PHYSICAL EXAMINATION:  VITAL SIGNS:  Blood pressure 157/95, pulse 130, respirations 20, temperature   is 98.4.  Pain scale 9 out of 10, O2 saturation is 100% on room air.  GENERAL APPEARANCE:  The patient appears somewhat lethargic, drowsy.  HEENT:  Dry mucous membranes.  Extraocular muscles intact.  Pupils equal,   round, react to light and accommodation, normocephalic, atraumatic.  NECK:  Supple, no JVD, no lymphadenopathy, no thyromegaly, nontender.  LUNGS:  Clear to auscultation bilaterally, good air entry, no wheezing.  HEART:  Tachycardic.  No murmurs, rubs or gallops.  S1, S2 is within normal   limits.  ABDOMEN:  Positive bowel sounds, somewhat mildly tender to palpation of the   epigastric area.  Otherwise, nondistended, no guarding, no rebound tenderness.  EXTREMITIES:  No clubbing, cyanosis or edema.  Dorsalis pedis pulses 2+   bilaterally.  SKIN:  No  rashes, no wounds.  Dry, poor skin turgor.  NEUROLOGIC:  Alert and oriented times 3.  Cranial nerves II-XII grossly   intact, nonfocal.    LABORATORY RESULTS:  1.  Significant for a pH of 7.3, pCO2 32, bicarbonate 16.4.  Sodium 136,   potassium 5.0, chloride 105, CO2 16, glucose 382, BUN 31, creatinine elevated   at 1.55.  2.  Hematocrit is normal at 46, hemoglobin at 15.6, anion gap elevated at 21.  3.  White blood cell count elevated at 23,000.8.    4.  Imaging studies:  Chest x-ray shows no focal consolidation or   pneumothorax.    ASSESSMENT AND PLAN:  1.  Diabetic ketoacidosis.  The patient was recently placed on an insulin   pump; however, she ran out of medications due to cost.  She has not been    taking her Lantus during the last 3 days.  Otherwise, we will place the   patient on diabetic ketoacidosis protocol, aggressive intravenous hydration,   trend electrolytes.  2.  Sepsis, most likely source of gastritis, gastroenteritis; however, I   cannot rule out, let us say, a lumber spine abscess given severe point   tenderness over her low back of the lumbar spine.  We will start vancomycin   and Zosyn at this time given leukocytosis of 23,000.8, obtain blood cultures,   urine cultures, sputum cultures and follow white blood cell, fever curve.  3.  Obtain imaging studies of the lumbar spine.  We will start with x-rays.    However, the patient might need a computed tomography with intravenous   contrast or magnetic resonance imaging with contrast once her kidney function   has improved.  4.  Low back pain.  The patient has point tenderness of the lumbar spine.  No   injection drug use.  We will start vancomycin and aztreonam in setting of    PENICILLIN ALLERGY.  Obtain x-rays of lumbosacral spine.  5.  Obtain further imaging depending on progression of pain and the patient's   kidney function.  6.  Hyperkalemia in the setting of diabetic ketoacidosis.  The patient will be   on diabetic ketoacidosis protocol.  Monitor closely.  7.  Acute renal insufficiency in setting of severe dehydration.  We will   rehydrate, trend creatinine.  8.  Azotemia, prerenal intravenous normal saline.  Trend blood urea nitrogen.  9.  Diabetes mellitus type 1.  The patient recently placed on an insulin pump;   however, she had been out of her medications and, due to costs, she has also   not been taking her Lantus in the last 3 days.  Consult case management for   medications at the time of discharge.  10.  Gastroparesis.   The patient has had nausea and vomiting.  We will resume   home medications.  Zofran p.r.n.     CODE STATUS:  FULL.    DIET:   Diabetic.    DEEP VENOUS THROMBOSIS PROPHYLAXIS:  The patient is ambulatory.     DISPOSITION:  Home in 2 to 3 days.  The case discussed with patient, ER attending.    ADDENDUM BY Kimberl Vig, MD:      Critical care time:  84 minutes.      ___________________  Irving Burton MD  Dictated By: .   TE  D:11/11/2012 23:35:39  T: 11/12/2012 00:45:22  [id number]  Authenticated by Irving Burton, MD On 11/13/2012 07:46:13 AM

## 2012-11-13 LAB — CBC WITH AUTOMATED DIFF
BASOPHILS: 0.3 % (ref 0–3)
EOSINOPHILS: 0 % (ref 0–5)
HCT: 35.4 % — ABNORMAL LOW (ref 37.0–50.0)
HGB: 11.7 gm/dl — ABNORMAL LOW (ref 13.0–17.2)
IMMATURE GRANULOCYTES: 0.5 % (ref 0.0–3.0)
LYMPHOCYTES: 15.4 % — ABNORMAL LOW (ref 28–48)
MCH: 30.2 pg (ref 25.4–34.6)
MCHC: 33.1 gm/dl (ref 30.0–36.0)
MCV: 91.2 fL (ref 80.0–98.0)
MONOCYTES: 4.2 % (ref 1–13)
MPV: 11.7 fL — ABNORMAL HIGH (ref 6.0–10.0)
NEUTROPHILS: 79.6 % — ABNORMAL HIGH (ref 34–64)
NRBC: 0 (ref 0–0)
PLATELET: 239 10*3/uL (ref 140–450)
RBC: 3.88 M/uL (ref 3.60–5.20)
RDW-SD: 41.7 (ref 36.4–46.3)
WBC: 15.6 10*3/uL — ABNORMAL HIGH (ref 4.0–11.0)

## 2012-11-13 LAB — METABOLIC PANEL, BASIC
BUN: 11 mg/dl (ref 7–25)
CO2: 19 mEq/L — ABNORMAL LOW (ref 21–32)
Calcium: 8.4 mg/dl — ABNORMAL LOW (ref 8.5–10.1)
Chloride: 106 mEq/L (ref 98–107)
Creatinine: 1 mg/dl (ref 0.6–1.3)
GFR est AA: >60
GFR est non-AA: >60
Glucose: 122 mg/dl — ABNORMAL HIGH (ref 74–106)
Potassium: 4.1 mEq/L (ref 3.5–5.1)
Sodium: 135 mEq/L — ABNORMAL LOW (ref 136–145)

## 2012-11-13 LAB — GLUCOSE, POC
Glucose (POC): 103 mg/dL (ref 65–105)
Glucose (POC): 185 mg/dL — ABNORMAL HIGH (ref 65–105)
Glucose (POC): 309 mg/dL — ABNORMAL HIGH (ref 65–105)
Glucose (POC): 160 mg/dL — ABNORMAL HIGH (ref 65–105)
Glucose (POC): 105 mg/dL (ref 65–105)
Glucose (POC): 310 mg/dL — ABNORMAL HIGH (ref 65–105)
Glucose (POC): 237 mg/dL — ABNORMAL HIGH (ref 65–105)
Glucose (POC): 105 mg/dL (ref 65–105)
Glucose (POC): 194 mg/dL — ABNORMAL HIGH (ref 65–105)
Glucose (POC): 242 mg/dL — ABNORMAL HIGH (ref 65–105)
Glucose (POC): 119 mg/dL — ABNORMAL HIGH (ref 65–105)
Glucose (POC): 221 mg/dL — ABNORMAL HIGH (ref 65–105)

## 2012-11-13 LAB — PHOSPHORUS: Phosphorus: 2 mg/dl — ABNORMAL LOW (ref 2.5–4.9)

## 2012-11-13 LAB — MAGNESIUM: Magnesium: 1.5 mg/dl — ABNORMAL LOW (ref 1.8–2.4)

## 2012-11-14 LAB — GLUCOSE, POC
Glucose (POC): 105 mg/dL (ref 65–105)
Glucose (POC): 108 mg/dL — ABNORMAL HIGH (ref 65–105)
Glucose (POC): 135 mg/dL — ABNORMAL HIGH (ref 65–105)
Glucose (POC): 155 mg/dL — ABNORMAL HIGH (ref 65–105)
Glucose (POC): 202 mg/dL — ABNORMAL HIGH (ref 65–105)
Glucose (POC): 208 mg/dL — ABNORMAL HIGH (ref 65–105)
Glucose (POC): 235 mg/dL — ABNORMAL HIGH (ref 65–105)
Glucose (POC): 267 mg/dL — ABNORMAL HIGH (ref 65–105)
Glucose (POC): 272 mg/dL — ABNORMAL HIGH (ref 65–105)
Glucose (POC): 276 mg/dL — ABNORMAL HIGH (ref 65–105)
Glucose (POC): 308 mg/dL — ABNORMAL HIGH (ref 65–105)
Glucose (POC): 94 mg/dL (ref 65–105)

## 2012-11-14 LAB — CBC WITH AUTOMATED DIFF
BASOPHILS: 0.4 % (ref 0–3)
EOSINOPHILS: 0 % (ref 0–5)
HCT: 32.4 % — ABNORMAL LOW (ref 37.0–50.0)
HGB: 10.8 gm/dl — ABNORMAL LOW (ref 13.0–17.2)
IMMATURE GRANULOCYTES: 0.5 % (ref 0.0–3.0)
LYMPHOCYTES: 23.2 % — ABNORMAL LOW (ref 28–48)
MCH: 30.5 pg (ref 25.4–34.6)
MCHC: 33.3 gm/dl (ref 30.0–36.0)
MCV: 91.5 fL (ref 80.0–98.0)
MONOCYTES: 4.8 % (ref 1–13)
MPV: 11 fL — ABNORMAL HIGH (ref 6.0–10.0)
NEUTROPHILS: 71.1 % — ABNORMAL HIGH (ref 34–64)
NRBC: 0 (ref 0–0)
PLATELET: 223 10*3/uL (ref 140–450)
RBC: 3.54 M/uL — ABNORMAL LOW (ref 3.60–5.20)
RDW-SD: 42 (ref 36.4–46.3)
WBC: 13.9 10*3/uL — ABNORMAL HIGH (ref 4.0–11.0)

## 2012-11-14 LAB — METABOLIC PANEL, BASIC
BUN: 12 mg/dl (ref 7–25)
CO2: 20 mEq/L — ABNORMAL LOW (ref 21–32)
Calcium: 8.2 mg/dl — ABNORMAL LOW (ref 8.5–10.1)
Chloride: 106 mEq/L (ref 98–107)
Creatinine: 1.3 mg/dl (ref 0.6–1.3)
GFR est AA: >60
GFR est non-AA: 52
Glucose: 127 mg/dl — ABNORMAL HIGH (ref 74–106)
Potassium: 4.2 mEq/L (ref 3.5–5.1)
Sodium: 135 mEq/L — ABNORMAL LOW (ref 136–145)

## 2012-11-14 LAB — PHOSPHORUS: Phosphorus: 2.4 mg/dl — ABNORMAL LOW (ref 2.5–4.9)

## 2012-11-14 LAB — LACTIC ACID: Lactic Acid: 1 mmol/L (ref 0.4–2.0)

## 2012-11-14 LAB — MAGNESIUM: Magnesium: 1.8 mg/dl (ref 1.8–2.4)

## 2012-11-15 LAB — CBC WITH AUTOMATED DIFF
BASOPHILS: 0.6 % (ref 0–3)
EOSINOPHILS: 2.4 % (ref 0–5)
HCT: 35.6 % — ABNORMAL LOW (ref 37.0–50.0)
HGB: 11.7 gm/dl — ABNORMAL LOW (ref 13.0–17.2)
IMMATURE GRANULOCYTES: 0.4 % (ref 0.0–3.0)
LYMPHOCYTES: 25.5 % — ABNORMAL LOW (ref 28–48)
MCH: 30.2 pg (ref 25.4–34.6)
MCHC: 32.9 gm/dl (ref 30.0–36.0)
MCV: 91.8 fL (ref 80.0–98.0)
MONOCYTES: 2.1 % (ref 1–13)
MPV: 11.6 fL — ABNORMAL HIGH (ref 6.0–10.0)
NEUTROPHILS: 69 % — ABNORMAL HIGH (ref 34–64)
NRBC: 0 (ref 0–0)
PLATELET: 226 10*3/uL (ref 140–450)
RBC: 3.88 M/uL (ref 3.60–5.20)
RDW-SD: 42.3 (ref 36.4–46.3)
WBC: 9 10*3/uL (ref 4.0–11.0)

## 2012-11-15 LAB — GLUCOSE, POC
Glucose (POC): 126 mg/dL — ABNORMAL HIGH (ref 65–105)
Glucose (POC): 132 mg/dL — ABNORMAL HIGH (ref 65–105)
Glucose (POC): 141 mg/dL — ABNORMAL HIGH (ref 65–105)
Glucose (POC): 163 mg/dL — ABNORMAL HIGH (ref 65–105)
Glucose (POC): 165 mg/dL — ABNORMAL HIGH (ref 65–105)
Glucose (POC): 176 mg/dL — ABNORMAL HIGH (ref 65–105)
Glucose (POC): 191 mg/dL — ABNORMAL HIGH (ref 65–105)
Glucose (POC): 219 mg/dL — ABNORMAL HIGH (ref 65–105)
Glucose (POC): 345 mg/dL — ABNORMAL HIGH (ref 65–105)
Glucose (POC): 379 mg/dL — ABNORMAL HIGH (ref 65–105)
Glucose (POC): 65 mg/dL (ref 65–105)

## 2012-11-15 LAB — METABOLIC PANEL, BASIC
BUN: 9 mg/dl (ref 7–25)
CO2: 15 mEq/L — ABNORMAL LOW (ref 21–32)
Calcium: 8.3 mg/dl — ABNORMAL LOW (ref 8.5–10.1)
Chloride: 99 mEq/L (ref 98–107)
Creatinine: 1.1 mg/dl (ref 0.6–1.3)
GFR est AA: >60
GFR est non-AA: >60
Glucose: 380 mg/dl — ABNORMAL HIGH (ref 74–106)
Potassium: 4.4 mEq/L (ref 3.5–5.1)
Sodium: 130 mEq/L — ABNORMAL LOW (ref 136–145)

## 2012-11-15 LAB — MAGNESIUM: Magnesium: 1.6 mg/dl — ABNORMAL LOW (ref 1.8–2.4)

## 2012-11-15 NOTE — ED Provider Notes (Signed)
[facility]  EMERGENCY DEPARTMENT TREATMENT REPORT  NAME:  [name] [name]  SEX:   F  ADMIT: 11/10/2012  DOB:   August 18, 1985  MR#    [id number]  ROOM:    TIME DICTATED: 11 15 AM  ACCT#  [id number]        CHIEF COMPLAINT:  Abdominal pain, nausea and vomiting.    HISTORY OF PRESENT ILLNESS:  This is a 27 year old female who has a long history of chronic recurrent  abdominal pain, nausea, vomiting and gastroparesis.  She states that she has  had nausea and vomiting since last night.  She denies fever.  She denies chest  pain or shortness of breath.  She does have a history of diabetes which leads  to her gastroparesis.  She states that the symptoms started after she went to  [facility], she said something did not taste right in the food and after going to  [facility] then she began having some nausea and vomiting.  She vomited several  times so she decided to see how she would feel overnight.  In the morning   she still stated that she did not feel right, and so she came into the ER.    She denies blood in her vomit.  She denies bilious vomiting.  She denies   chest pain or shortness of breath.    REVIEW OF SYSTEMS:  CONSTITUTIONAL:  No fever or chills.  EYES:  No visual symptoms.   ENT:  No sore throat, runny nose or other URI symptoms.   RESPIRATORY:  No cough, shortness of breath.  CARDIOVASCULAR:  No chest pain.  GASTROINTESTINAL:  She has nausea and vomiting with epigastric abdominal pain,  no diarrhea.  No melena, hematochezia or hematemesis.  GENITOURINARY:  No dysuria, frequency or urgency. No hematuria  MUSCULOSKELETAL:  No joint pain or swelling.    INTEGUMENTARY:  No rashes.  NEUROLOGICAL:  No headaches, sensory or motor symptoms.       PAST MEDICAL HISTORY:  Diabetes and gastroparesis.    PAST SURGICAL HISTORY:  C-section.    PSYCHIATRIC HISTORY:  Negative.    SOCIAL HISTORY:  Denies tobacco or illicit drug use.  She does drink alcohol socially  occasionally.    MEDICATIONS:  Humalog.    ALLERGIES:   PENICILLIN.    PHYSICAL EXAMINATION:  VITAL SIGNS:  Blood pressure 192/101, pulse 87, respirations 20, temperature  98, pain is 7 out of 10, O2 sats are 99% on room air.  GENERAL APPEARANCE:  Patient appears well developed and well nourished.   Appearance and behavior are age and situation appropriate.  She appears  uncomfortable in mild distress.  HEENT:  Head is atraumatic, normocephalic.  Eyes:  Conjunctivae clear, lids  normal.  Pupils equal, symmetrical, and normally reactive.  She has no scleral  icterus.  Ears/Nose:  Hearing is grossly intact to voice.  Internal and  external examinations of the ears and nose are unremarkable.   Mouth/Throat:   Surfaces of the pharynx, palate and tongue are pink, moist and without   lesions.  Mucous membranes are slightly dry.  NECK:  Supple, nontender.  She has no JVD, no meningismus, no stridor.  RESPIRATORY:  Clear and equal breath sounds.  No respiratory distress,  tachypnea or accessory muscle use.   CARDIOVASCULAR:  Heart regular, without murmurs, gallops, rubs or thrills.  DP  pulses 2+ and equal bilaterally.  She has no peripheral edema.  ABDOMEN:  Soft with mild to moderate tenderness to  palpation in the  epigastrium.  She has no guarding, no rebound tenderness, no organomegaly.  Nails:  No clubbing or deformities.  Nailbeds pink with prompt capillary  refill.  She has no obvious deformities of her extremities, she does move all  extremities without difficulty.  She felt too lightheaded to ambulate.  SKIN:  Warm and dry without rashes.     NEUROLOGIC:  She is alert and oriented.  Upper extremity and lower extremity  strength and sensation are grossly intact.    INITIAL ASSESSMENT:  Nausea and vomiting with epigastric abdominal pain.  She believes that her  symptoms are related to food poisoning because she said that she started  throwing up immediately after eating some [employer] she said that did not taste   right.  I am going to give her IV fluids, treat her symptomatically, check   some  basic labs on her and then disposition her appropriately based on the results  of her diagnostic workup.    DIAGNOSTIC STUDIES:  CBC showed a white count of 15.6, hemoglobin and hematocrit 13.2 and 39.4.    She  had 87% neutrophils.  Urinalysis showed 500 glucose and trace ketones.    EMERGENCY DEPARTMENT COURSE:  She received morphine for pain.  She received 2 doses of Zofran as well as   2000  mL normal saline bolus.  She was feeling much better.  Of note, is that her  chem panel and lipase were hemolyzed several times and as such they were not  run.  The second time they came back hemolyzed I went in to re-evaluate the  patient, she said she was feeling much better, she was tolerating p.o. at this  time and she felt like she was good enough to go home.  So we elected not to  repeat her blood draw and have her wait another hour to hour and a half to try  to draw the results as she is feeling better at this time and wants to go   home. She did ambulate out of the department without difficulty and was   tolerating PO, and I did give her appropriate followup care.    CLINICAL IMPRESSION:  1. Food poisoning.  2. Nausea and vomiting.  3. Dehydration.  4. Acute on chronic abdominal pain.  5. Gastroparesis.    DISPOSITION:  The patient is discharged with verbal and written instructions and a referral  for ongoing care.  The patient is aware that they may return at any time for  new or worsening symptoms. She was discharged home with instructions for the  same.  She was given prescriptions for Zofran as well as Norco.  She does have  a good relationship with her gastroenterologist, she is going to follow up   with  them tomorrow and she knows she can return sooner if she has any change or  worsening.      ___________________  Johny Drilling MD  Dictated By: Marland Kitchen     My signature above authenticates this document and my orders, the final   diagnosis (es), discharge prescription (s), and instructions in the [facility]  Pulsecheck record.  Nursing notes have been reviewed by the physician/mid-level provider.    If you have any questions please contact [phone].    [facility]  D:11/15/2012 11:15:54  T: 11/15/2012 11:47:08  [id number]  Authenticated and Linus Orn by Johny Drilling, M.D. On 11/15/12 12:38:04 PM

## 2012-11-16 LAB — GLUCOSE, POC
Glucose (POC): 102 mg/dL (ref 65–105)
Glucose (POC): 104 mg/dL (ref 65–105)
Glucose (POC): 119 mg/dL — ABNORMAL HIGH (ref 65–105)
Glucose (POC): 156 mg/dL — ABNORMAL HIGH (ref 65–105)
Glucose (POC): 156 mg/dL — ABNORMAL HIGH (ref 65–105)
Glucose (POC): 158 mg/dL — ABNORMAL HIGH (ref 65–105)
Glucose (POC): 160 mg/dL — ABNORMAL HIGH (ref 65–105)
Glucose (POC): 174 mg/dL — ABNORMAL HIGH (ref 65–105)
Glucose (POC): 181 mg/dL — ABNORMAL HIGH (ref 65–105)
Glucose (POC): 242 mg/dL — ABNORMAL HIGH (ref 65–105)

## 2012-11-16 LAB — METABOLIC PANEL, BASIC
BUN: 4 mg/dl — ABNORMAL LOW (ref 7–25)
CO2: 24 mEq/L (ref 21–32)
Calcium: 8.1 mg/dl — ABNORMAL LOW (ref 8.5–10.1)
Chloride: 104 mEq/L (ref 98–107)
Creatinine: 0.9 mg/dl (ref 0.6–1.3)
GFR est AA: >60
GFR est non-AA: >60
Glucose: 106 mg/dl (ref 74–106)
Potassium: 3.9 mEq/L (ref 3.5–5.1)
Sodium: 136 mEq/L (ref 136–145)

## 2012-11-16 LAB — CBC WITH AUTOMATED DIFF
BASOPHILS: 0.7 % (ref 0–3)
EOSINOPHILS: 3.6 % (ref 0–5)
HCT: 30.7 % — ABNORMAL LOW (ref 37.0–50.0)
HGB: 10.3 gm/dl — ABNORMAL LOW (ref 13.0–17.2)
IMMATURE GRANULOCYTES: 0.1 % (ref 0.0–3.0)
LYMPHOCYTES: 50.1 % — ABNORMAL HIGH (ref 28–48)
MCH: 29.8 pg (ref 25.4–34.6)
MCHC: 33.6 gm/dl (ref 30.0–36.0)
MCV: 88.7 fL (ref 80.0–98.0)
MONOCYTES: 4.1 % (ref 1–13)
MPV: 10.6 fL — ABNORMAL HIGH (ref 6.0–10.0)
NEUTROPHILS: 41.4 % (ref 34–64)
NRBC: 0 (ref 0–0)
PLATELET: 225 10*3/uL (ref 140–450)
RBC: 3.46 M/uL — ABNORMAL LOW (ref 3.60–5.20)
RDW-SD: 39.3 (ref 36.4–46.3)
WBC: 7.5 10*3/uL (ref 4.0–11.0)

## 2012-11-16 LAB — PHOSPHORUS: Phosphorus: 2.9 mg/dl (ref 2.5–4.9)

## 2012-11-16 LAB — MAGNESIUM: Magnesium: 1.7 mg/dl — ABNORMAL LOW (ref 1.8–2.4)

## 2012-11-17 LAB — METABOLIC PANEL, BASIC
BUN: 3 mg/dl — ABNORMAL LOW (ref 7–25)
CO2: 28 mEq/L (ref 21–32)
Calcium: 8.5 mg/dl (ref 8.5–10.1)
Chloride: 99 mEq/L (ref 98–107)
Creatinine: 1.1 mg/dl (ref 0.6–1.3)
GFR est AA: >60
GFR est non-AA: >60
Glucose: 198 mg/dl — ABNORMAL HIGH (ref 74–106)
Potassium: 3.8 mEq/L (ref 3.5–5.1)
Sodium: 134 mEq/L — ABNORMAL LOW (ref 136–145)

## 2012-11-17 LAB — GLUCOSE, POC
Glucose (POC): 130 mg/dL — ABNORMAL HIGH (ref 65–105)
Glucose (POC): 131 mg/dL — ABNORMAL HIGH (ref 65–105)

## 2012-11-27 LAB — METABOLIC PANEL, COMPREHENSIVE
ALT (SGPT): 19 U/L (ref 12–78)
AST (SGOT): 21 U/L (ref 15–37)
Albumin: 3.4 gm/dl (ref 3.4–5.0)
Alk. phosphatase: 105 U/L (ref 50–136)
BUN: 12 mg/dl (ref 7–25)
Bilirubin, total: 0.6 mg/dl (ref 0.2–1.0)
CO2: 22 mEq/L (ref 21–32)
Calcium: 9 mg/dl (ref 8.5–10.1)
Chloride: 106 mEq/L (ref 98–107)
Creatinine: 0.9 mg/dl (ref 0.6–1.3)
GFR est AA: >60
GFR est non-AA: >60
Glucose: 126 mg/dl — ABNORMAL HIGH (ref 74–106)
Potassium: 3.7 mEq/L (ref 3.5–5.1)
Protein, total: 8.1 gm/dl (ref 6.4–8.2)
Sodium: 139 mEq/L (ref 136–145)

## 2012-11-27 LAB — CBC WITH AUTOMATED DIFF
BASOPHILS: 0.5 % (ref 0–3)
EOSINOPHILS: 0 % (ref 0–5)
HCT: 36 % — ABNORMAL LOW (ref 37.0–50.0)
HGB: 12.1 gm/dl — ABNORMAL LOW (ref 13.0–17.2)
IMMATURE GRANULOCYTES: 0.3 % (ref 0.0–3.0)
LYMPHOCYTES: 6.7 % — ABNORMAL LOW (ref 28–48)
MCH: 30.4 pg (ref 25.4–34.6)
MCHC: 33.6 gm/dl (ref 30.0–36.0)
MCV: 90.5 fL (ref 80.0–98.0)
MONOCYTES: 1.8 % (ref 1–13)
MPV: 11.4 fL — ABNORMAL HIGH (ref 6.0–10.0)
NEUTROPHILS: 90.7 % — ABNORMAL HIGH (ref 34–64)
NRBC: 0 (ref 0–0)
PLATELET: 342 10*3/uL (ref 140–450)
RBC: 3.98 M/uL (ref 3.60–5.20)
RDW-SD: 42 (ref 36.4–46.3)
WBC: 15.9 10*3/uL — ABNORMAL HIGH (ref 4.0–11.0)

## 2012-11-27 LAB — POC CHEM8
Anion gap: 19 mmol/L (ref 10–20)
BUN: 12 mg/dl (ref 7–25)
CALCIUM,IONIZED: 4.7 mg/dl (ref 4.40–5.40)
CO2, TOTAL: 22 mmol/L (ref 21–32)
Chloride: 105 mEq/L (ref 98–107)
Creatinine: 0.8 mg/dl (ref 0.6–1.3)
Glucose: 133 mg/dl — ABNORMAL HIGH (ref 74–106)
HCT: 39 % (ref 38–45)
HGB: 13.3 gm/dl (ref 13.0–17.2)
Potassium: 4 mEq/L (ref 3.5–4.9)
Sodium: 140 mEq/L (ref 136–145)

## 2012-11-27 LAB — LIPASE: Lipase: 53 U/L — ABNORMAL LOW (ref 73–393)

## 2012-11-28 LAB — POC URINE MACROSCOPIC
Bilirubin: NEGATIVE
Blood: NEGATIVE
Glucose: 500 mg/dl — AB
Ketone: 15 mg/dl — AB
Leukocyte Esterase: NEGATIVE
Nitrites: NEGATIVE
Protein: 100 mg/dl — AB
Specific gravity: 1.025 (ref 1.005–1.030)
Urobilinogen: 1 EU/dl (ref 0.0–1.0)
pH (UA): 8.5 (ref 5–9)

## 2012-11-28 LAB — POC HCG,URINE: HCG urine, QL: NEGATIVE

## 2012-11-28 NOTE — ED Provider Notes (Signed)
[facility]  EMERGENCY DEPARTMENT TREATMENT REPORT  NAME:  [name] [name]  SEX:   F  ADMIT: 11/27/2012  DOB:   August 17, 1985  MR#    [id number]  ROOM:    TIME DICTATED: 08 18 PM  ACCT#  [id number]    cc: Mike Gip MD    TIME OF EVALUATION:   1847    FAMILY DOCTOR:   Unknown.    GASTROENTEROLOGIST:  Dr. Nolon Nations    CHIEF COMPLAINT:  Vomiting, nausea.    HISTORY OF PRESENT ILLNESS:  A 27 year old female with history of gastroparesis and DKA due to   insulin-dependent diabetes presents today, since this morning over 10 episodes   of vomiting, left upper quadrant abdominal pain.  Presents for further   evaluation.    REVIEW OF SYSTEMS:  CONSTITUTIONAL:  No fevers or chills.  EYES:   No visual symptoms.  ENT:  No sore throat, runny nose, or other URI symptoms.  RESPIRATORY:  No cough, shortness of breath, or wheezing.  CARDIOVASCULAR:  No chest pain, chest pressure, or palpitations.   GASTROINTESTINAL:  As above.  Bowel movement this morning was normal.    Vomiting.  Has no hematemesis.  GENITOURINARY:  No dysuria, frequency, or urgency.  No vaginal bleeding or   discharge.  MUSCULOSKELETAL:  No joint pain or swelling.  INTEGUMENTARY:  No rashes.  NEUROLOGICAL:  No headaches, sensory or motor symptoms.     PAST MEDICAL HISTORY:  Gastroparesis, DKA, insulin-dependent diabetes.    PAST SURGICAL HISTORY:  C-section.    SOCIAL HISTORY:  Social  drinker, nonsmoker.  No drug use.    FAMILY HISTORY:  Unknown.    ALLERGIES:  PENICILLIN.    MEDICATIONS:   Humalog, Lantus, Reglan, lisinopril.     PHYSICAL EXAMINATION:  VITAL SIGNS:  Blood pressure 175/100, pulse 99, respirations 18, temperature   98.7 orally, pain 8 out of 10, O2 saturations 99% on room air.  GENERAL APPEARANCE:  Patient appears well developed and well nourished.    Appearance and behavior are age and situation appropriate.  The patient   appears very comfortable and when discussing her symptoms begins to cry.   HEENT:  Eyes:  Conjunctivae clear, lids normal.  Pupils equal, symmetrical,   and normally reactive.  Ears/Nose:  Hearing is grossly intact to voice.    Internal and external examinations of the ears and nose are unremarkable.    Mouth/Throat:  Surfaces of the pharynx, palate, and tongue are pink, moist,   and without lesions.  Nasal mucosa, septum, and turbinates unremarkable.    Teeth and gums unremarkable.  RESPIRATORY:  Clear and equal breath sounds.  No respiratory distress,   tachypnea, or accessory muscle use.    CARDIOVASCULAR:   Heart regular, without murmurs, gallops, rubs, or thrills.    GASTROINTESTINAL:  Abdomen soft.  Mild tenderness to palpation left upper   quadrant.  No rebound, no guarding.  Negative Murphy's sign.  No tenderness   over McBurney's point.  Negative Rovsing's sign.  No other abdominal   tenderness.  No CVA or flank tenderness.  GENITOURINARY:  Deferred due to lack of lower abdominal pain or vaginal   symptoms.  MUSCULOSKELETAL:  Stance and gait appear normal.  SPINE:  There is no   localized cervical, thoracic, lumbar or sacral body tenderness to palpation or   fist percussion.  There are no bony step-offs, ecchymosis, areas of soft   tissue swelling or deformities.    SKIN:  Warm  and dry without rashes.  NEUROLOGIC:  Alert, oriented.  Sensation intact, motor strength equal and   symmetric.    CONTINUATION BY BRIAN HENDRICK, PA-C:     INITIAL ASSESSMENT AND MANAGEMENT PLAN:   A 27 year old female who is primarily nauseous and vomiting.  In review of old   records the patient has presented multiple times for the same complaint and   had low back pain associated with it.  We will obtain screening studies for a   urinary infection and acute pancreatitis, hepatitis, etc. and medicate   symptomatically.  The patient has a very benign abdominal examination.  We   will attempt to control nausea and vomiting as well as rule out DKA.    DIAGNOSTIC STUDIES:   CBC showed mildly elevated white blood cell count of 15.9, hemoglobin and   hematocrit of 12.1 and 36.  Mean platelet volume 11.4, neutrophils 90.7,   lymphocytes 6.7, otherwise unremarkable.  Lipase at 53 and normal, CMP showed   glucose 126, otherwise unremarkable.  Point of care urinalysis showed 500   glucose, 15 ketones, 100 protein, otherwise unremarkable.  Urine pregnancy   negative.  I-STAT Chem-8 showed 133, otherwise unremarkable.    COURSE:  The patient stable in the Emergency Department, did not develop other   symptoms, nausea was finally controlled with 2 doses of IV Zofran and the   patient was given morphine for pain which improved her pain as well.  The   patient given 1 liter of fluids and not develop other symptoms, informed of   results.  Was able to sip a small amount of fluid without vomiting.  At this   time, there is no compelling evidence for admission.  The patient was comfortable with discharge and subsequently discharged in   stable condition.    FINAL DIAGNOSES:  1.  Nausea and vomiting.  2.  Abdominal pain, left upper quadrant.  3.  Low back pain.    DISPOSITION AND PLAN:  Discharge instructions given.  The patient given prescription for Robaxin for   her low back pain, Phenergan for nausea and rectal suppositories for nausea as   well and declined a work note.  The patient is discharged home in stable   condition, with instructions to follow up with their regular doctor.  They are   advised to return immediately for any worsening or symptoms of concern. The   patient was personally evaluated by myself and Dr. Sherie Don. Beldon Nowling who   agrees with the above assessment and plan.      ___________________  Wynelle Bourgeois MD  Dictated By: Mearl Latin. Lockie Pares, PA-C    My signature above authenticates this document and my orders, the final  diagnosis (es), discharge prescription (s), and instructions in the [facility]   Pulsecheck record.   Nursing notes have been reviewed by the physician/mid-level provider.    If you have any questions please contact [phone].    LH  D:11/27/2012 20:18:07  T: 11/28/2012 07:41:44  [id number]  Authenticated by Wynelle Bourgeois, MD On 11/28/2012 09:01:50 PM

## 2012-11-29 LAB — CBC WITH AUTOMATED DIFF
BASOPHILS: 0.5 % (ref 0–3)
EOSINOPHILS: 0 % (ref 0–5)
HCT: 35.5 % — ABNORMAL LOW (ref 37.0–50.0)
HGB: 11.9 gm/dl — ABNORMAL LOW (ref 13.0–17.2)
IMMATURE GRANULOCYTES: 0.5 % (ref 0.0–3.0)
LYMPHOCYTES: 16 % — ABNORMAL LOW (ref 28–48)
MCH: 30.3 pg (ref 25.4–34.6)
MCHC: 33.5 gm/dl (ref 30.0–36.0)
MCV: 90.3 fL (ref 80.0–98.0)
MONOCYTES: 5 % (ref 1–13)
MPV: 10.9 fL — ABNORMAL HIGH (ref 6.0–10.0)
NEUTROPHILS: 78 % — ABNORMAL HIGH (ref 34–64)
NRBC: 0 (ref 0–0)
PLATELET: 308 10*3/uL (ref 140–450)
RBC: 3.93 M/uL (ref 3.60–5.20)
RDW-SD: 42.3 (ref 36.4–46.3)
WBC: 14.7 10*3/uL — ABNORMAL HIGH (ref 4.0–11.0)

## 2012-11-29 LAB — METABOLIC PANEL, COMPREHENSIVE
ALT (SGPT): 15 U/L (ref 12–78)
AST (SGOT): 9 U/L — ABNORMAL LOW (ref 15–37)
Albumin: 3.2 gm/dl — ABNORMAL LOW (ref 3.4–5.0)
Alk. phosphatase: 100 U/L (ref 50–136)
BUN: 22 mg/dl (ref 7–25)
Bilirubin, total: 1 mg/dl (ref 0.2–1.0)
CO2: 20 mEq/L — ABNORMAL LOW (ref 21–32)
Calcium: 8.8 mg/dl (ref 8.5–10.1)
Chloride: 107 mEq/L (ref 98–107)
Creatinine: 1 mg/dl (ref 0.6–1.3)
GFR est AA: >60
GFR est non-AA: >60
Glucose: 123 mg/dl — ABNORMAL HIGH (ref 74–106)
Potassium: 3.3 mEq/L — ABNORMAL LOW (ref 3.5–5.1)
Protein, total: 7.4 gm/dl (ref 6.4–8.2)
Sodium: 138 mEq/L (ref 136–145)

## 2012-11-29 LAB — LIPASE: Lipase: 36 U/L — ABNORMAL LOW (ref 73–393)

## 2012-11-29 LAB — GLUCOSE, POC: Glucose (POC): 115 mg/dL — ABNORMAL HIGH (ref 65–105)

## 2012-11-29 NOTE — ED Provider Notes (Signed)
[facility]  EMERGENCY DEPARTMENT TREATMENT REPORT  NAME:  [place], [place]  SEX:   F  ADMIT: 11/28/2012  DOB:   1985-03-02  MR#    [id number]  ROOM:    TIME DICTATED: 11 02 PM  ACCT#  [id number]        CHIEF COMPLAINT:   A  27 year old female seen  11/28/2012, chief complaint of nausea, vomiting,   back pain, abdominal pain, seen for same yesterday.    HISTORY OF PRESENT ILLNESS:  The patient was seen yesterday for the same complaint.  She has gastroparesis.   She has a longstanding history of this and is complaining of her typical   gastroparesis exacerbation.  She was treated and released from the Emergency   Department yesterday after improving.  Unfortunately, after returning home,   her symptoms returned.  Her home medications plus the medications that were   prescribed did not help and subsequently, she returned today for further   evaluation.    REVIEW OF SYSTEMS:  CONSTITUTIONAL:  No fever, chills, or weight loss.   EYES:   No visual symptoms.   ENT:  No sore throat, runny nose, or other URI symptoms.   RESPIRATORY:  No cough, shortness of breath, or wheezing.   CARDIOVASCULAR:  No chest pain, chest pressure, or palpitations.   GASTROINTESTINAL:  Left upper abdominal pain. Generalized back pain, nausea,   vomiting. She describes her symptoms as typical for her gastroparesis   exacerbation.  MUSCULOSKELETAL:  No joint pain or swelling.   INTEGUMENTARY:  No rashes.   NEUROLOGICAL:  No headaches, sensory or motor symptoms.     PAST MEDICAL AND SURGICAL HISTORY:  Diabetes, gastroparesis. She has had a C-section.    SOCIAL HISTORY:  Nonsmoker.  Drinks socially twice a month.  She denies drug use.    ALLERGIES:  PENICILLINS.    CURRENT MEDICATIONS:  Humalog, Lantus, lisinopril, and Phenergan, Reglan and Robaxin.     PHYSICAL EXAMINATION:    VITAL SIGNS:  Blood pressure 157/93, pulse 107, respirations 16, temperature   98.7, pain 8 out of 10, saturations 100% on room air.   GENERAL APPEARANCE:  A pleasant 27 year old who appears ill, but nontoxic.    She is lying in bed.  Rolling around complaining of pain, dry heaving.  There is no actual vomiting   at this time.  RESPIRATORY:  Lungs are clear throughout.  CARDIOVASCULAR:  Heart is regular, but slightly tachycardic.    ABDOMEN:  Nondistended. It is soft to the touch.  Bowel sounds are present   throughout.  She has reproducible left upper and epigastric abdominal pain   only.  There is no pain anywhere else about the abdomen.  No distention, no   organomegaly.  MUSCULOSKELETAL:  She moves her extremities without difficulty.  She moves   around the bed without difficulty, no edema.  SKIN:  Warm and dry, no rashes.  NEUROLOGIC:  Awake, alert, oriented and answers questions appropriately,   follows commands.    CONTINUATION BY Vinnie Langton, MD:    ASSESSMENT AND MANAGEMENT:    The patient was here yesterday.  She is still having vomiting and has a long   history of gastroparesis, feels like she has similar symptoms.  She will also   be evaluated also ketoacidosis.    LABORATORY TESTS:  Hematocrit and hemoglobin 35 and 12, white count slightly elevated at 14,000.    Potassium 3.3, glucose 123, CO2 20.  Hepatic profile essentially normal.  Lipase 36.    COURSE IN EMERGENCY DEPARTMENT:  The patient received 23 liters of IV fluids, received Zofran, Benadryl,   Ativan, Reglan IV and no further vomiting for 4 hours.  She slept and was   comfortable.  Abdomen is nontender.  No diarrhea.  She tolerated fluids and   wished to go home.  I gave her an additional prescription of Zofran OTC and   also Ativan to use p.r.n. for her symptoms.  She understands she will return   if symptoms worsen again as they have occasionally in the past but often she   is able to tolerate fluids at home and feel better.  She will continue to   monitor her sugars.    CLINICAL IMPRESSION:  1.  Acute gastroparesis with vomiting and dehydration.  2.  Diabetes.       ___________________  Smitty Cords MD  Dictated By: Janett Billow, PA-C    My signature above authenticates this document and my orders, the final  diagnosis (es), discharge prescription (s), and instructions in the [facility]   Pulsecheck record.  Nursing notes have been reviewed by the physician/mid-level provider.    If you have any questions please contact [phone].    PB  D:11/28/2012 23:02:52  T: 11/29/2012 00:27:49  [id number]  Authenticated by Smitty Cords, M.D. On 11/30/2012 02:56:49 AM

## 2012-12-18 LAB — CBC WITH AUTOMATED DIFF
BASOPHILS: 0.2 % (ref 0–3)
EOSINOPHILS: 0 % (ref 0–5)
HCT: 39.4 % (ref 37.0–50.0)
HGB: 13.6 gm/dl (ref 13.0–17.2)
IMMATURE GRANULOCYTES: 0.4 % (ref 0.0–3.0)
LYMPHOCYTES: 10.2 % — ABNORMAL LOW (ref 28–48)
MCH: 30.6 pg (ref 25.4–34.6)
MCHC: 34.5 gm/dl (ref 30.0–36.0)
MCV: 88.5 fL (ref 80.0–98.0)
MONOCYTES: 3.9 % (ref 1–13)
MPV: 11.7 fL — ABNORMAL HIGH (ref 6.0–10.0)
NEUTROPHILS: 85.3 % — ABNORMAL HIGH (ref 34–64)
NRBC: 0 (ref 0–0)
PLATELET: 303 10*3/uL (ref 140–450)
RBC: 4.45 M/uL (ref 3.60–5.20)
RDW-SD: 39.6 (ref 36.4–46.3)
WBC: 17.8 10*3/uL — ABNORMAL HIGH (ref 4.0–11.0)

## 2012-12-18 LAB — POC URINE MACROSCOPIC
Bilirubin: NEGATIVE
Blood: NEGATIVE
Glucose: 500 mg/dl — AB
Ketone: >=160 mg/dl — AB
Leukocyte Esterase: NEGATIVE
Nitrites: NEGATIVE
Protein: 100 mg/dl — AB
Specific gravity: >=1.03 (ref 1.005–1.030)
Urobilinogen: 0.2 EU/dl (ref 0.0–1.0)
pH (UA): 5.5 (ref 5–9)

## 2012-12-18 LAB — METABOLIC PANEL, COMPREHENSIVE
ALT (SGPT): 15 U/L (ref 12–78)
AST (SGOT): 12 U/L — ABNORMAL LOW (ref 15–37)
Albumin: 4.5 gm/dl (ref 3.4–5.0)
Alk. phosphatase: 115 U/L (ref 45–117)
BUN: 23 mg/dl (ref 7–25)
Bilirubin, total: 0.9 mg/dl (ref 0.2–1.0)
CO2: 23 mEq/L (ref 21–32)
Calcium: 10.1 mg/dl (ref 8.5–10.1)
Chloride: 102 mEq/L (ref 98–107)
Creatinine: 1.1 mg/dl (ref 0.6–1.3)
GFR est AA: >60
GFR est non-AA: >60
Glucose: 258 mg/dl — ABNORMAL HIGH (ref 74–106)
Potassium: 3.6 mEq/L (ref 3.5–5.1)
Protein, total: 9.2 gm/dl — ABNORMAL HIGH (ref 6.4–8.2)
Sodium: 135 mEq/L — ABNORMAL LOW (ref 136–145)

## 2012-12-18 LAB — LIPASE: Lipase: 48 U/L — ABNORMAL LOW (ref 73–393)

## 2012-12-18 LAB — GLUCOSE, POC: Glucose (POC): 180 mg/dL — ABNORMAL HIGH (ref 65–105)

## 2012-12-18 LAB — POC HCG,URINE: HCG urine, QL: NEGATIVE

## 2012-12-18 NOTE — ED Provider Notes (Signed)
[facility]  EMERGENCY DEPARTMENT TREATMENT REPORT  NAME:  [name] [name]  SEX:   F  ADMIT: 12/18/2012  DOB:   1985-12-29  MR#    [id number]  ROOM:    TIME DICTATED: 01 32 PM  ACCT#  [id number]        CHIEF COMPLAINT:  Vomiting.    HISTORY OF PRESENT ILLNESS:  This is a 27 year old female who presents with vomiting, vomited 3 to 4 times   today, with complaints of periumbilical pain, constant cramping with some   radiation to the back, started yesterday with vomiting yesterday and no   hematemesis, no fever.  States she has had similar symptoms with her   gastroparesis.  The patient took her medications this morning, but was unable   to hold them down.  Complains of some body aches as well.  States she saw her   GI doctor yesterday.  She had some of these symptoms at that time.  There was   no change in her medications.    REVIEW OF SYSTEMS:  CONSTITUTIONAL:  No fever.  ENT:  No sore throat.  RESPIRATORY:  No cough.  CARDIOVASCULAR:  No chest pain.  GASTROINTESTINAL:  Positive abdominal pain, positive vomiting, no hematemesis,   no diarrhea.  GENITOURINARY:  No dysuria.  MUSCULOSKELETAL:  Positive back pain, positive body aches.  INTEGUMENTARY:  No rash.  HEMATOLOGICAL:  No complaints of bruising.  NEUROLOGICAL:  No dizziness, no syncope.    PAST MEDICAL HISTORY:  Hypertension, diabetes and history of gastroparesis.    FAMILY HISTORY:  Positive for aunt having diabetes.    SOCIAL HISTORY:  Denies tobacco.    MEDICATIONS:  Ativan, Humalog, Lantus and lisinopril.  The patient did not take her insulin   this morning, states her blood sugar was 73.    PHYSICAL EXAMINATION:  VITAL SIGNS:  Blood pressure 189/111, pulse of 114, respirations 15,   temperature 98.9, O2 saturation 100%.  GENERAL APPEARANCE:  Thin female, alert.  HEENT:  Eyes:  Conjunctivae clear, lids normal.  Pupils equal, symmetrical,   and normally reactive.  Mouth/Throat:  Surfaces of the pharynx, palate and    tongue are pink, moist and without lesions.   NECK:  Supple, nontender, symmetrical, no masses or JVD, trachea midline,   thyroid not enlarged, nodular or tender.   LYMPHATICS:  No cervical or submandibular lymphadenopathy palpated.   HEART:  Normal sinus rhythm, no murmurs heard.  LUNGS:  Clear, no wheezing, no retractions, no respiratory distress.  ABDOMEN:  Soft, no tenderness on exam.  No guarding, no rebound, no mass   palpable.  EXTREMITIES:  No edema to the lower extremities, no erythema.  SKIN:  No rash.  NEUROLOGIC:  The patient is alert, moving all extremities well, no acute focal   deficits.    ASSESSMENT AND MANAGEMENT PLAN:  This is a patient who has a benign abdominal exam with complaints of pain and   vomiting.  We will obtain labs, evaluate for any pancreatitis.  This is likely   her gastroparesis.  No fever and no localized tenderness.  We will treat her   symptoms.    COURSE IN THE EMERGENCY DEPARTMENT:  The patient given IV fluids, Benadryl, Reglan and IV Pepcid with no further   vomiting, improvement of pain.  She was given a dose of oral lisinopril.    DIAGNOSTIC INTERPRETATIONS:  Urine obtained, large ketones.  Pregnancy was negative.  CBC:  White count   17.8, hemoglobin  13.6, hematocrit 39.4.  CMP:  Glucose 258 and bicarbonate   normal at 23.    COURSE IN THE EMERGENCY DEPARTMENT:  Second liter of IV fluids given and repeat blood sugar was 180.  Repeat   abdominal exam:  The patient has no tenderness.  The patient does have some   leukocytosis, however, nontender exam with no fever, no urinary tract   infection, likely leukocytosis secondary to the stress and the vomiting.    FINAL DIAGNOSES:  1.  Vomiting.  2.  Dehydration.  3.  Abdominal pain, improved.  4.  History of gastroparesis.    DISPOSITION AND PLAN:  The patient is discharged.  Prescription for Zofran.  Frequent liquids   suggested.  The patient was evaluated by myself and Dr. Henrene Hawking who agrees with    the above assessment and plan.    ADDENDUM BY DIANA HOULE, PA:    EMERGENCY DEPARTMENT COURSE:  Repeat blood pressure 131/81 after the p.o. lisinopril.      ___________________  Konrad Felix MD  Dictated By: Windell Hummingbird. Houle, PA    My signature above authenticates this document and my orders, the final  diagnosis (es), discharge prescription (s), and instructions in the [facility]   Pulsecheck record.  Nursing notes have been reviewed by the physician/mid-level provider.    If you have any questions please contact [phone].    SC  D:12/18/2012 13:32:14  T: 12/18/2012 14:06:22  [id number]  Authenticated by Janett Billow. Henrene Hawking, M.D. On 01/07/2013 10:46:53 AM

## 2012-12-20 LAB — CBC WITH AUTOMATED DIFF
BASOPHILS: 0.5 % (ref 0–3)
EOSINOPHILS: 0.1 % (ref 0–5)
HCT: 36 % — ABNORMAL LOW (ref 37.0–50.0)
HGB: 12.4 gm/dl — ABNORMAL LOW (ref 13.0–17.2)
IMMATURE GRANULOCYTES: 0.4 % (ref 0.0–3.0)
LYMPHOCYTES: 19 % — ABNORMAL LOW (ref 28–48)
MCH: 30.2 pg (ref 25.4–34.6)
MCHC: 34.4 gm/dl (ref 30.0–36.0)
MCV: 87.8 fL (ref 80.0–98.0)
MONOCYTES: 4.9 % (ref 1–13)
MPV: 12.3 fL — ABNORMAL HIGH (ref 6.0–10.0)
NEUTROPHILS: 75.1 % — ABNORMAL HIGH (ref 34–64)
NRBC: 0 (ref 0–0)
PLATELET: 259 10*3/uL (ref 140–450)
RBC: 4.1 M/uL (ref 3.60–5.20)
RDW-SD: 39 (ref 36.4–46.3)
WBC: 14.2 10*3/uL — ABNORMAL HIGH (ref 4.0–11.0)

## 2012-12-20 LAB — POC CHEM8
Anion gap: 18 mmol/L (ref 10–20)
BUN: 12 mg/dl (ref 7–25)
CALCIUM,IONIZED: 4.3 mg/dl — ABNORMAL LOW (ref 4.40–5.40)
CO2, TOTAL: 19 mmol/L — ABNORMAL LOW (ref 21–32)
Chloride: 101 mEq/L (ref 98–107)
Creatinine: 0.8 mg/dl (ref 0.6–1.3)
Glucose: 271 mg/dl — ABNORMAL HIGH (ref 74–106)
HCT: 38 % (ref 38–45)
HGB: 12.9 gm/dl — ABNORMAL LOW (ref 13.0–17.2)
Potassium: 3.6 mEq/L (ref 3.5–4.9)
Sodium: 133 mEq/L — ABNORMAL LOW (ref 136–145)

## 2012-12-20 LAB — METABOLIC PANEL, COMPREHENSIVE
ALT (SGPT): 11 U/L — ABNORMAL LOW (ref 12–78)
AST (SGOT): 10 U/L — ABNORMAL LOW (ref 15–37)
Albumin: 3.3 gm/dl — ABNORMAL LOW (ref 3.4–5.0)
Alk. phosphatase: 83 U/L (ref 45–117)
BUN: 11 mg/dl (ref 7–25)
Bilirubin, total: 0.9 mg/dl (ref 0.2–1.0)
CO2: 21 mEq/L (ref 21–32)
Calcium: 8 mg/dl — ABNORMAL LOW (ref 8.5–10.1)
Chloride: 106 mEq/L (ref 98–107)
Creatinine: 0.8 mg/dl (ref 0.6–1.3)
GFR est AA: >60
GFR est non-AA: >60
Glucose: 183 mg/dl — ABNORMAL HIGH (ref 74–106)
Potassium: 3.1 mEq/L — ABNORMAL LOW (ref 3.5–5.1)
Protein, total: 6.8 gm/dl (ref 6.4–8.2)
Sodium: 136 mEq/L (ref 136–145)

## 2012-12-20 LAB — LIPASE: Lipase: 48 U/L — ABNORMAL LOW (ref 73–393)

## 2012-12-20 LAB — GLUCOSE, POC
Glucose (POC): 163 mg/dL — ABNORMAL HIGH (ref 65–105)
Glucose (POC): 127 mg/dL — ABNORMAL HIGH (ref 65–105)
Glucose (POC): 200 mg/dL — ABNORMAL HIGH (ref 65–105)
Glucose (POC): 146 mg/dL — ABNORMAL HIGH (ref 65–105)
Glucose (POC): 92 mg/dL (ref 65–105)

## 2012-12-20 LAB — POC URINE MACROSCOPIC
Blood: NEGATIVE
Glucose: 100 mg/dl — AB
Ketone: >=160 mg/dl — AB
Leukocyte Esterase: NEGATIVE
Nitrites: NEGATIVE
Protein: 100 mg/dl — AB
Specific gravity: >=1.03 (ref 1.005–1.030)
Urobilinogen: 4 EU/dl — ABNORMAL HIGH (ref 0.0–1.0)
pH (UA): 6.5 (ref 5–9)

## 2012-12-20 LAB — METABOLIC PANEL, BASIC
BUN: 10 mg/dl (ref 7–25)
CO2: 17 mEq/L — ABNORMAL LOW (ref 21–32)
Calcium: 8.3 mg/dl — ABNORMAL LOW (ref 8.5–10.1)
Chloride: 106 mEq/L (ref 98–107)
Creatinine: 0.7 mg/dl (ref 0.6–1.3)
GFR est AA: >60
GFR est non-AA: >60
Glucose: 210 mg/dl — ABNORMAL HIGH (ref 74–106)
Potassium: 3.9 mEq/L (ref 3.5–5.1)
Sodium: 137 mEq/L (ref 136–145)

## 2012-12-20 LAB — HEMOGLOBIN A1C WITH EAG: Hemoglobin A1c: 7.5 % — ABNORMAL HIGH (ref 4.8–6.0)

## 2012-12-20 LAB — POC HCG,URINE: HCG urine, QL: NEGATIVE

## 2012-12-20 NOTE — ED Provider Notes (Signed)
[facility]  EMERGENCY DEPARTMENT TREATMENT REPORT  NAME:  [place], [place]  SEX:   F  ADMIT: 12/20/2012  DOB:   1985-09-04  MR#    [id number]  ROOM:  UE45  TIME DICTATED: 09 48 AM  ACCT#  [id number]      I hereby certify this patient for admission based upon medical necessity as  noted below:    TIME OF EVALUATION:  7:40 a.m.    CHIEF COMPLAINT:  Nausea and vomiting.    HISTORY OF PRESENT ILLNESS:  The patient is a 27 year old female with history of diabetes and   gastroparesis.   She is insulin-dependent coming in today stating she has been unable to stop  vomiting for the past 4 days.  She was here 2 days ago and evaluated and  written a prescription for Zofran which has not been working because she has  not been able to keep it down.  She has still been unable to eat and drink and  successfully keep food or drink down over the past 4 days.  She has got a  little pain in left upper quadrant of her abdomen due to her current emesis.   It is a 9 out of 10.  She is denying any diarrhea, no fever.  She has a little  burning in her chest with the emesis, but no other chest pain or shortness of  breath.  Her last bowel movement was 4 days ago and unremarkable per her.  She  has had episodes of DKA before.  She states this pain is familiar to her   though  and what she typically experiences with gastroparesis.    REVIEW OF SYSTEMS:  CONSTITUTIONAL:  No fevers.  EYES:   No visual symptoms.  ENT:  No sore throat, runny nose, or other URI symptoms.  ENDOCRINE:  History of insulin-dependent diabetes.  RESPIRATORY:  No shortness of breath or cough.  CARDIOVASCULAR:  Some burning chest pain with emesis.  No other chest pressure  or palpitations.  GASTROINTESTINAL:  Left upper quadrant abdominal pain with nausea and   vomiting,  no diarrhea.  GENITOURINARY:  No dysuria, frequency, or urgency.  MUSCULOSKELETAL:  No joint pain or swelling.  INTEGUMENTARY:  No rashes.   NEUROLOGICAL:  No headaches, sensory or motor symptoms.    PAST MEDICAL HISTORY:  Diabetes, hypertension, gastroparesis, C-section.    FAMILY HISTORY:  Diabetes.    SOCIAL HISTORY:  She is a social drinker.  Denies tobacco or drug use.  Lives at home with  family.    ALLERGIES:   REVIEWED IN IBEX.    MEDICATIONS:   Reviewed in Ibex.      PHYSICAL EXAMINATION:  VITAL SIGNS:  Blood pressure 172/99, pulse 116, respirations 22, temperature  97.9, saturations 100% on room air; it says BiPAP in Ibex, should be room air.  GENERAL APPEARANCE:  Patient appears well developed and well nourished.   Appearance and behavior are age and situation appropriate.  The patient is  lying on her side on the stretcher, appears uncomfortable.  She is holding an  emesis bag on evaluation, but she is able to provide her own history.  HEENT:  Oral mucosa is notably dry.  Teeth and gums unremarkable.  NECK:  Supple, symmetrical.  Trachea is midline.  LYMPHATIC:  No cervical or submandibular lymphadenopathy palpated.  RESPIRATORY:  Clear and equal breath sounds.  No respiratory distress,  tachypnea, or accessory muscle use.    CARDIOVASCULAR:  Heart regular, without murmurs, gallops, rubs, or thrills.       GASTROINTESTINAL:  She has bowel sounds intact on auscultation of the abdomen.     Abdomen is soft.  She is uncomfortable with palpation, especially in the left  upper quadrant with no guarding or rebound appreciated.  No other abdominal  discomfort appreciated in other areas on palpation.  MUSCULOSKELETAL:  She can move all 4 extremities.  SKIN:  Warm and dry.  NEUROLOGIC:  She is alert and oriented times 3.    INITIAL ASSESSMENT AND MANAGEMENT PLAN:  This is a 27 year old female coming in for evaluation of nausea and vomiting  and left upper quadrant abdominal pain with a history of gastroparesis and   type  1 diabetes.  We will start with checking an i-STAT to see what her bicarbonate   is and if she has an anion gap for possibility of DKA in this patient.  We are  going to check a urine and urine pregnancy as well and more extensive lab work  if necessary.  We will start hydrating the patient with normal saline, some  Reglan for her gastroparesis, antiemetics as needed.      CONTINUED BY Damien Fusi, MD:    IMPRESSION AND MANAGEMENT PLAN:  The patient was independently evaluated and examined by myself and I concur  with the above findings as documented by Eleonore Chiquito, PA-C.    The patient presents with increasing nausea with repeated episodes of vomiting  over the last 24 hours.  She also complains of abdominal pain, which is  consistent with prior description with her previous bouts of gastroparesis.   The patient will be hydrated and treated for her specific symptoms.  Serum  electrolytes will be checked as well. The patient's disposition will be  determined based on the results of her diagnostic studies and her clinical  progress.    DIAGNOSIS INTERPRETATIONS:  The patient's urinalysis dip for ketones in an excess of 160. ISTAT 8 returned  normal except for a CO2 of 19, and glucose of 271. Hemoglobin and hematocrit  returned at 12.4 and 36, white count was elevated at  14,200.    COURSE IN THE EMERGENCY DEPARTMENT:  The patient was hydrated with normal saline with IV normal saline while in the  department. She is also medicated for her symptoms and continue with some  nausea.  The patient was medicated with Reglan and started on IV insulin drip.     With her early acidosis as documented, Dr. Jillene Bucks from the [facility]   hospitalist  group is consulted. He has agreed to admit the patient to his service for  further care.     FINAL DIAGNOSIS:  Abdominal pain secondary to gastroparesis with early diabetic ketoacidosis.    DISPOSITION:   The patient was admitted to [facility] as above.      ___________________  Elsie Saas MD  Dictated By: Bettey Mare. Earlene Plater, PA-C     My signature above authenticates this document and my orders, the final  diagnosis (es), discharge prescription (s), and instructions in the [facility]  Pul[facility]ck record.  Nursing notes have been reviewed by the physician/mid-level provider.    If you have any questions please contact [phone].    VA  D:12/20/2012 09:48:51  T: 12/20/2012 10:04:02  [id number]  Authenticated and Linus Orn by Cliffton Asters James Senn, M.D. On 12/20/12 12:11:32 PM

## 2012-12-21 LAB — METABOLIC PANEL, BASIC
BUN: 4 mg/dl — ABNORMAL LOW (ref 7–25)
BUN: 5 mg/dl — ABNORMAL LOW (ref 7–25)
BUN: 6 mg/dl — ABNORMAL LOW (ref 7–25)
BUN: 7 mg/dl (ref 7–25)
CO2: 22 mEq/L (ref 21–32)
CO2: 22 mEq/L (ref 21–32)
CO2: 23 mEq/L (ref 21–32)
CO2: 24 mEq/L (ref 21–32)
Calcium: 8.2 mg/dl — ABNORMAL LOW (ref 8.5–10.1)
Calcium: 8.3 mg/dl — ABNORMAL LOW (ref 8.5–10.1)
Calcium: 8.5 mg/dl (ref 8.5–10.1)
Calcium: 8.5 mg/dl (ref 8.5–10.1)
Chloride: 102 mEq/L (ref 98–107)
Chloride: 103 mEq/L (ref 98–107)
Chloride: 105 mEq/L (ref 98–107)
Chloride: 98 mEq/L (ref 98–107)
Creatinine: 0.8 mg/dl (ref 0.6–1.3)
Creatinine: 0.8 mg/dl (ref 0.6–1.3)
Creatinine: 0.8 mg/dl (ref 0.6–1.3)
Creatinine: 1 mg/dl (ref 0.6–1.3)
GFR est AA: >60
GFR est AA: >60
GFR est AA: >60
GFR est AA: >60
GFR est non-AA: >60
GFR est non-AA: >60
GFR est non-AA: >60
GFR est non-AA: >60
Glucose: 200 mg/dl — ABNORMAL HIGH (ref 74–106)
Glucose: 294 mg/dl — ABNORMAL HIGH (ref 74–106)
Glucose: 82 mg/dl (ref 74–106)
Glucose: 86 mg/dl (ref 74–106)
Potassium: 2.9 mEq/L — CL (ref 3.5–5.1)
Potassium: 3.5 mEq/L (ref 3.5–5.1)
Potassium: 3.5 mEq/L (ref 3.5–5.1)
Potassium: 3.8 mEq/L (ref 3.5–5.1)
Sodium: 130 mEq/L — ABNORMAL LOW (ref 136–145)
Sodium: 134 mEq/L — ABNORMAL LOW (ref 136–145)
Sodium: 135 mEq/L — ABNORMAL LOW (ref 136–145)
Sodium: 136 mEq/L (ref 136–145)

## 2012-12-21 LAB — CBC WITH AUTOMATED DIFF
BASOPHILS: 0.7 % (ref 0–3)
EOSINOPHILS: 2.3 % (ref 0–5)
HCT: 31 % — ABNORMAL LOW (ref 37.0–50.0)
HGB: 10.7 gm/dl — ABNORMAL LOW (ref 13.0–17.2)
IMMATURE GRANULOCYTES: 0.3 % (ref 0.0–3.0)
LYMPHOCYTES: 40.8 % (ref 28–48)
MCH: 29.9 pg (ref 25.4–34.6)
MCHC: 34.5 gm/dl (ref 30.0–36.0)
MCV: 86.6 fL (ref 80.0–98.0)
MONOCYTES: 6.5 % (ref 1–13)
MPV: 11.7 fL — ABNORMAL HIGH (ref 6.0–10.0)
NEUTROPHILS: 49.4 % (ref 34–64)
NRBC: 0 (ref 0–0)
PLATELET: 227 10*3/uL (ref 140–450)
RBC: 3.58 M/uL — ABNORMAL LOW (ref 3.60–5.20)
RDW-SD: 37.1 (ref 36.4–46.3)
WBC: 10.6 10*3/uL (ref 4.0–11.0)

## 2012-12-21 LAB — MAGNESIUM
Magnesium: 1.4 mg/dl — ABNORMAL LOW (ref 1.8–2.4)
Magnesium: 1.5 mg/dl — ABNORMAL LOW (ref 1.8–2.4)

## 2012-12-21 LAB — GLUCOSE, POC
Glucose (POC): 171 mg/dL — ABNORMAL HIGH (ref 65–105)
Glucose (POC): 105 mg/dL (ref 65–105)
Glucose (POC): 113 mg/dL — ABNORMAL HIGH (ref 65–105)
Glucose (POC): 270 mg/dL — ABNORMAL HIGH (ref 65–105)
Glucose (POC): 216 mg/dL — ABNORMAL HIGH (ref 65–105)
Glucose (POC): 333 mg/dL — ABNORMAL HIGH (ref 65–105)

## 2012-12-21 LAB — PHOSPHORUS: Phosphorus: 2.7 mg/dl (ref 2.5–4.9)

## 2012-12-22 LAB — METABOLIC PANEL, BASIC
BUN: 3 mg/dl — ABNORMAL LOW (ref 7–25)
BUN: 4 mg/dl — ABNORMAL LOW (ref 7–25)
CO2: 21 mEq/L (ref 21–32)
CO2: 24 mEq/L (ref 21–32)
Calcium: 8.3 mg/dl — ABNORMAL LOW (ref 8.5–10.1)
Calcium: 8.9 mg/dl (ref 8.5–10.1)
Chloride: 105 mEq/L (ref 98–107)
Chloride: 106 mEq/L (ref 98–107)
Creatinine: 0.7 mg/dl (ref 0.6–1.3)
Creatinine: 0.8 mg/dl (ref 0.6–1.3)
GFR est AA: >60
GFR est AA: >60
GFR est non-AA: >60
GFR est non-AA: >60
Glucose: 114 mg/dl — ABNORMAL HIGH (ref 74–106)
Glucose: 159 mg/dl — ABNORMAL HIGH (ref 74–106)
Potassium: 3.4 mEq/L — ABNORMAL LOW (ref 3.5–5.1)
Potassium: 4.7 mEq/L (ref 3.5–5.1)
Sodium: 138 mEq/L (ref 136–145)
Sodium: 139 mEq/L (ref 136–145)

## 2012-12-22 LAB — CBC WITH AUTOMATED DIFF
BASOPHILS: 0.6 % (ref 0–3)
EOSINOPHILS: 3.6 % (ref 0–5)
HCT: 32.8 % — ABNORMAL LOW (ref 37.0–50.0)
HGB: 11.4 gm/dl — ABNORMAL LOW (ref 13.0–17.2)
IMMATURE GRANULOCYTES: 0.4 % (ref 0.0–3.0)
LYMPHOCYTES: 39.7 % (ref 28–48)
MCH: 30.4 pg (ref 25.4–34.6)
MCHC: 34.8 gm/dl (ref 30.0–36.0)
MCV: 87.5 fL (ref 80.0–98.0)
MONOCYTES: 6.5 % (ref 1–13)
MPV: 11.3 fL — ABNORMAL HIGH (ref 6.0–10.0)
NEUTROPHILS: 49.2 % (ref 34–64)
NRBC: 0 (ref 0–0)
PLATELET: 223 10*3/uL (ref 140–450)
RBC: 3.75 M/uL (ref 3.60–5.20)
RDW-SD: 38.2 (ref 36.4–46.3)
WBC: 8 10*3/uL (ref 4.0–11.0)

## 2012-12-22 LAB — MAGNESIUM: Magnesium: 1.8 mg/dl (ref 1.8–2.4)

## 2012-12-22 LAB — GLUCOSE, POC
Glucose (POC): 340 mg/dL — ABNORMAL HIGH (ref 65–105)
Glucose (POC): 242 mg/dL — ABNORMAL HIGH (ref 65–105)
Glucose (POC): 51 mg/dL — ABNORMAL LOW (ref 65–105)
Glucose (POC): 88 mg/dL (ref 65–105)
Glucose (POC): 122 mg/dL — ABNORMAL HIGH (ref 65–105)
Glucose (POC): 162 mg/dL — ABNORMAL HIGH (ref 65–105)
Glucose (POC): 114 mg/dL — ABNORMAL HIGH (ref 65–105)

## 2012-12-22 LAB — PHOSPHORUS: Phosphorus: 4.2 mg/dl (ref 2.5–4.9)

## 2012-12-23 LAB — CBC WITH AUTOMATED DIFF
BASOPHILS: 0.7 % (ref 0–3)
EOSINOPHILS: 1.9 % (ref 0–5)
HCT: 35.3 % — ABNORMAL LOW (ref 37.0–50.0)
HGB: 11.9 gm/dl — ABNORMAL LOW (ref 13.0–17.2)
IMMATURE GRANULOCYTES: 0.1 % (ref 0.0–3.0)
LYMPHOCYTES: 40.7 % (ref 28–48)
MCH: 29.2 pg (ref 25.4–34.6)
MCHC: 33.7 gm/dl (ref 30.0–36.0)
MCV: 86.7 fL (ref 80.0–98.0)
MONOCYTES: 5.6 % (ref 1–13)
MPV: 11.1 fL — ABNORMAL HIGH (ref 6.0–10.0)
NEUTROPHILS: 51 % (ref 34–64)
NRBC: 0 (ref 0–0)
PLATELET: 274 10*3/uL (ref 140–450)
RBC: 4.07 M/uL (ref 3.60–5.20)
RDW-SD: 37.4 (ref 36.4–46.3)
WBC: 8.1 10*3/uL (ref 4.0–11.0)

## 2012-12-23 LAB — METABOLIC PANEL, BASIC
BUN: 5 mg/dl — ABNORMAL LOW (ref 7–25)
CO2: 24 mEq/L (ref 21–32)
Calcium: 8.7 mg/dl (ref 8.5–10.1)
Chloride: 108 mEq/L — ABNORMAL HIGH (ref 98–107)
Creatinine: 0.8 mg/dl (ref 0.6–1.3)
GFR est AA: >60
GFR est non-AA: >60
Glucose: 72 mg/dl — ABNORMAL LOW (ref 74–106)
Potassium: 3.7 mEq/L (ref 3.5–5.1)
Sodium: 141 mEq/L (ref 136–145)

## 2012-12-23 LAB — MAGNESIUM: Magnesium: 1.8 mg/dl (ref 1.8–2.4)

## 2012-12-23 LAB — GLUCOSE, POC
Glucose (POC): 172 mg/dL — ABNORMAL HIGH (ref 65–105)
Glucose (POC): 73 mg/dL (ref 65–105)
Glucose (POC): 71 mg/dL (ref 65–105)
Glucose (POC): 71 mg/dL (ref 65–105)

## 2012-12-23 LAB — PHOSPHORUS: Phosphorus: 4 mg/dl (ref 2.5–4.9)

## 2013-02-03 LAB — BLOOD TYPE, (ABO+RH)
ABO/Rh(D): O POS
ABO/Rh: O POS

## 2013-02-03 LAB — POC URINE MACROSCOPIC
Bilirubin: NEGATIVE
Glucose: 500 mg/dl — AB
Ketone: 80 mg/dl — AB
Leukocyte Esterase: NEGATIVE
Nitrites: NEGATIVE
Protein: >=300 mg/dl — AB
Specific gravity: >=1.03 (ref 1.005–1.030)
Urobilinogen: 0.2 EU/dl (ref 0.0–1.0)
pH (UA): 6 (ref 5–9)

## 2013-02-03 LAB — TSH 3RD GENERATION: TSH: 0.445 u[IU]/mL (ref 0.358–3.740)

## 2013-02-03 LAB — CBC WITH AUTOMATED DIFF
BASOPHILS: 0.4 % (ref 0–3)
BASOPHILS: 0.2 % (ref 0–3)
EOSINOPHILS: 0 % (ref 0–5)
EOSINOPHILS: 0 % (ref 0–5)
HCT: 38.4 % (ref 37.0–50.0)
HCT: 36.4 % — ABNORMAL LOW (ref 37.0–50.0)
HGB: 13.2 gm/dl (ref 13.0–17.2)
HGB: 12.2 gm/dl — ABNORMAL LOW (ref 13.0–17.2)
IMMATURE GRANULOCYTES: 0.4 % (ref 0.0–3.0)
IMMATURE GRANULOCYTES: 0.4 % (ref 0.0–3.0)
LYMPHOCYTES: 8.2 % — ABNORMAL LOW (ref 28–48)
LYMPHOCYTES: 8 % — ABNORMAL LOW (ref 28–48)
MCH: 30.3 pg (ref 25.4–34.6)
MCH: 29.3 pg (ref 25.4–34.6)
MCHC: 34.4 gm/dl (ref 30.0–36.0)
MCHC: 33.5 gm/dl (ref 30.0–36.0)
MCV: 88.3 fL (ref 80.0–98.0)
MCV: 87.5 fL (ref 80.0–98.0)
MONOCYTES: 1.1 % (ref 1–13)
MONOCYTES: 1.4 % (ref 1–13)
MPV: 12.6 fL — ABNORMAL HIGH (ref 6.0–10.0)
MPV: 12.6 fL — ABNORMAL HIGH (ref 6.0–10.0)
NEUTROPHILS: 89.9 % — ABNORMAL HIGH (ref 34–64)
NEUTROPHILS: 90 % — ABNORMAL HIGH (ref 34–64)
NRBC: 0 (ref 0–0)
NRBC: 0 (ref 0–0)
PLATELET: 240 10*3/uL (ref 140–450)
PLATELET: 250 10*3/uL (ref 140–450)
RBC: 4.35 M/uL (ref 3.60–5.20)
RBC: 4.16 M/uL (ref 3.60–5.20)
RDW-SD: 38.7 (ref 36.4–46.3)
RDW-SD: 38.7 (ref 36.4–46.3)
WBC: 17.1 10*3/uL — ABNORMAL HIGH (ref 4.0–11.0)
WBC: 20.7 10*3/uL — ABNORMAL HIGH (ref 4.0–11.0)

## 2013-02-03 LAB — METABOLIC PANEL, COMPREHENSIVE
ALT (SGPT): 18 U/L (ref 12–78)
ALT (SGPT): 21 U/L (ref 12–78)
AST (SGOT): 19 U/L (ref 15–37)
AST (SGOT): 24 U/L (ref 15–37)
Albumin: 3.7 gm/dl (ref 3.4–5.0)
Albumin: 4.3 gm/dl (ref 3.4–5.0)
Alk. phosphatase: 123 U/L — ABNORMAL HIGH (ref 45–117)
Alk. phosphatase: 135 U/L — ABNORMAL HIGH (ref 45–117)
BUN: 16 mg/dl (ref 7–25)
BUN: 18 mg/dl (ref 7–25)
Bilirubin, total: 0.7 mg/dl (ref 0.2–1.0)
Bilirubin, total: 0.7 mg/dl (ref 0.2–1.0)
CO2: 17 mEq/L — ABNORMAL LOW (ref 21–32)
CO2: 20 mEq/L — ABNORMAL LOW (ref 21–32)
Calcium: 8.5 mg/dl (ref 8.5–10.1)
Calcium: 9.4 mg/dl (ref 8.5–10.1)
Chloride: 103 mEq/L (ref 98–107)
Chloride: 105 mEq/L (ref 98–107)
Creatinine: 0.9 mg/dl (ref 0.6–1.3)
Creatinine: 1 mg/dl (ref 0.6–1.3)
GFR est AA: >60
GFR est AA: >60
GFR est non-AA: >60
GFR est non-AA: >60
Glucose: 238 mg/dl — ABNORMAL HIGH (ref 74–106)
Glucose: 294 mg/dl — ABNORMAL HIGH (ref 74–106)
Potassium: 4 mEq/L (ref 3.5–5.1)
Potassium: 4.2 mEq/L (ref 3.5–5.1)
Protein, total: 7.8 gm/dl (ref 6.4–8.2)
Protein, total: 8.8 gm/dl — ABNORMAL HIGH (ref 6.4–8.2)
Sodium: 135 mEq/L — ABNORMAL LOW (ref 136–145)
Sodium: 135 mEq/L — ABNORMAL LOW (ref 136–145)

## 2013-02-03 LAB — T4, FREE: Free T4: 1.12 ng/dl (ref 0.76–1.46)

## 2013-02-03 LAB — PHOSPHORUS: Phosphorus: 3.4 mg/dl (ref 2.5–4.9)

## 2013-02-03 LAB — ANTIBODY SCREEN: Antibody screen: NEGATIVE

## 2013-02-03 LAB — GLUCOSE, POC
Glucose (POC): 244 mg/dL — ABNORMAL HIGH (ref 65–105)
Glucose (POC): 232 mg/dL — ABNORMAL HIGH (ref 65–105)

## 2013-02-03 LAB — MAGNESIUM: Magnesium: 1.5 mg/dl — ABNORMAL LOW (ref 1.8–2.4)

## 2013-02-03 LAB — LIPASE: Lipase: 61 U/L — ABNORMAL LOW (ref 73–393)

## 2013-02-03 NOTE — H&P (Signed)
[facility]  History [facility] Physical  NAME:  [name], [name]  SEX:   F  ADMIT: 02/03/2013  DOB:September 15, 1985  MR#    [id number]  ROOM:  5232  ACCT#  [id number]    I hereby certify this patient for admission based upon medical necessity as   noted below:    <cc: Wynell BalloonSusan Kim-Foley MD    CHIEF COMPLAINT:  Nausea and vomiting with stomach and back pain.    HISTORY OF PRESENT ILLNESS:  The patient is a 28 year old female with history of hypertension, diabetes   type 1 on insulin, and gastroparesis who presents with complaints of nausea   and vomiting and abdominal pain.  The patient denies any fever or chills.  No   shortness of breath or chest pain.  OTC meds are not working.  The patient has   not been able to take in much in the way of p.o.  Symptoms are not improving.    She states she has not been able to take all of her diabetes meds.  The   patient's vomitus is noted to be heme positive.    PAST MEDICAL HISTORY:  Hypertension, diabetes type 1 on insulin, gastroparesis.      SURGICAL HISTORY:  Includes C-section.    ALLERGIES:  PENICILLINS AND ZITHROMAX.    MEDICATIONS:  Humalog sliding scale, Lantus 28 units daily at bedtime, lisinopril 1 p.o.   daily.    FAMILY HISTORY:  Noncontributory.    SOCIAL HISTORY:  Reveals that the patient does not smoke.  She does drink socially   approximately twice per month.  She denies any illicit drug use.    REVIEW OF SYSTEMS:  1.  See above.  2.  ROS is positive for nausea and vomiting.  The patient has not been able   stop this with medications at home.  She denies any fever or chills.  No   shortness of breath or chest pain.  She has not been able to take all of her   medications at home.  She has not been able to take in anything p.o. of   substance.  She denies any shortness of breath or chest pain.  Vomitus is heme   positive.  ROS is otherwise noncontributory.    PHYSICAL EXAMINATION:  VITAL SIGNS:  Blood pressure of 178/94, pulse 105, respirations 18,    temperature 98, pulse oximetry 100% on room air.  GENERAL APPEARANCE:  The patient appears ill.  HEENT:  NC/AT, PERRLA, EOMI.  Dry mucous membranes.  HEENT otherwise   unremarkable.  NECK:  Supple and nontender.  No significant cervical or supraclavicular   adenopathy.  HEART:  Increased rate, regular rhythm.  No murmurs, rubs or gallops.  LUNGS:  Decreased breath sounds at bases, coarse upper airway sounds.  ABDOMEN:  Normoactive bowel sounds, soft, mild diffuse discomfort to   palpation.  No guarding or rebound.  No masses or hepatosplenomegaly.   GENITOURINARY:  Normal female, otherwise deferred.  EXTREMITIES:  Normal, nontender without edema.  NEUROLOGIC:  Cranial nerves II-XII intact bilaterally.    LABORATORY EVALUATION:  Reveals white blood count of 17.1, hemoglobin and hematocrit 13.2 and 38.4,   platelets 240, 89 segs, 8 lymphs, 1 mono.  Lipase is 61.  Sodium is 135,   potassium 4, chloride 103, CO2 20, BUN 18, creatinine 1,  glucose 94, alkaline   phosphatase 135, SGOT 24, SGPT 21.  Repeat comprehensive metabolic panel   reveals sodium 135, potassium 4.2, chloride  105, CO2 17, BUN 16, creatinine   0.9, glucose 238.  Magnesium is 1.5.  TSH is 0.445.  Free T4 is 1.12.    Hemoglobin A1c is 8.2.  EKG reveals sinus tachycardia with a heart rate of   103.  Chest x-ray reveals normal chest.  Lungs are clear and well expanded.  A   normal size heart.    IMPRESSION:  A 28 year old female with history of hypertension, diabetes mellitus type 1,   and gastroparesis with diabetic ketoacidosis, uncontrolled diabetes mellitus   with acute hyperglycemia, dehydration, and an upper gastrointestinal bleed   with hematemesis and intractable nausea and vomiting.    PLAN:  The patient will be admitted for inpatient treatment.  She will be given   oxygen to be titrated to effect and supportive care.  The patient will be   placed on IV insulin drip per DKA protocol.  Blood sugars will be monitored    and insulin drip will be adjusted accordingly.  The patient will be given GI   and DVT prophylaxis.  Electrolytes will be replaced as needed.  The patient   will be hydrated with IV fluids and monitored to avoid fluid overload.  She   will be given symptomatic treatment for her nausea and vomiting.  The patient   will be placed on IV Protonix drip per protocol.  A gastroenterology consult   has been initiated in the Emergency Department and patient will be seen for   evaluation.  Further recommendations will be based on test results, response   to treatment, and clinical course.      ___________________  Otilio Carpen MD  Dictated By: .   Edmonia Caprio  D:02/03/2013 21:13:10  T: 02/03/2013 21:37:32  [id number]  Authenticated by Sharren Bridge. Valynn Schamberger, M.D. On 03/07/2013 06:05:34 AM

## 2013-02-03 NOTE — ED Provider Notes (Signed)
[facility]  EMERGENCY DEPARTMENT TREATMENT REPORT  NAME:  [name]  SEX:   F  ADMIT: 02/03/2013  DOB:   07/06/1985  MR#    [id number]  ROOM:  UE45  TIME DICTATED: 12 08 PM  ACCT#  [id number]        I hereby certify this patient for admission based upon medical necessity as   noted below.      CHIEF COMPLAINT:  Abdominal pain, vomiting.    HISTORY OF PRESENT ILLNESS:  A 28 year old female who presents complaining of left lower quadrant abdominal   pain, sharp, severe, rates it as a 9 on a scale of 1 to 10, associated with   some low back pain as well, bilateral in location.  Also, having some   recurrent vomiting greater than 15 times today.  No fever, no diarrhea.  Has   had some similar symptoms with her gastroparesis.  States symptoms started   suddenly this morning, not associated with any food.    REVIEW OF SYSTEMS:  CONSTITUTIONAL:  No fever.  ENT:  No sore throat.  RESPIRATORY:  No cough.  CARDIOVASCULAR:  No chest pain.  GASTROINTESTINAL:  Positive abdominal pain, positive vomiting, no diarrhea.  GENITOURINARY:  No dysuria.  MUSCULOSKELETAL:  Positive low backache.  INTEGUMENTARY:  No rash.  HEMATOLOGICAL:  Positive dark-colored emesis.  NEUROLOGICAL:  No dizziness, no syncope.    PAST MEDICAL HISTORY:  Gastroparesis, diabetes, hypertension.  Followed by Dr. Nolon Nations for her   gastroparesis.    SOCIAL HISTORY:  Negative tobacco.    FAMILY HISTORY:  Positive for diabetes.    MEDICATIONS:  Humalog, Lantus, lisinopril.    PHYSICAL EXAMINATION:  VITAL SIGNS:  Blood 183/103, pulse 102, respirations 18, temperature 98.  GENERAL:  Well-developed, well-nourished female, alert.  HEENT:  Eyes:  Conjunctivae clear, lids normal.  Pupils equal, symmetrical,   and normally reactive.   Mouth/Throat:  Surfaces of the pharynx, palate, and   tongue are pink, moist, and without lesions.    NECK:  Supple, nontender, symmetrical, no masses or JVD, trachea midline,   thyroid not enlarged, nodular, or tender.   LYMPHATIC:  No cervical or submandibular lymphadenopathy palpated.   HEART:  Normal sinus rhythm, no murmurs heard.  LUNGS:  Clear, no wheezing, no retractions, no respiratory distress.  ABDOMEN:  Bowel sounds present, soft.  Some tenderness noted, left upper   quadrant.  No guarding, no rebound, no mass palpable.  SKIN:  No rash.  NEUROLOGICAL:  No focal deficits.  The patient alert, moving all extremities.  BACK:  No redness, no swelling noted.    COURSE IN THE EMERGENCY DEPARTMENT:  The patient had some coffee-ground colored emesis noted.  No bright red blood.    Gastroccult done; it was positive.  We will obtain labs including hemoglobin   and hematocrit, type and screen, and with the Gastroccult positive-emesis,   the patient given Protonix.  She has no fever, benign abdominal exam.  No   peritoneal findings.  With the recurrent visits for gastroparesis; she is also   diabetic, we will evaluate for possible DKA, as she has not checked her sugar   this morning.    DISPOSITION AND PLAN:      CONTINUATION BY DIANA HOULE, PA:     Rectal exam done.  Minimal stool.  Negative guaiac.    DIAGNOSTIC INTERPRETATION:  Chemistry obtained, blood sugar 294, chloride at 20, anion gap of 12.  Lipase   61.  CBC:  White count 17.1, hemoglobin 13.2, hematocrit 38.4.    The patient has some leukocytosis, suspect this from all the vomiting.  She   has a benign abdominal exam, do not suspect acute intraabdominal pathology.    COURSE IN THE EMERGENCY DEPARTMENT:  The patient was given IV Benadryl, Reglan and fluids, given, and IV Protonix.    Repeat evaluation at 1300.  She is feeling better.  No vomiting.  Abdominal   pain gone.  In addition, she was given regular insulin 5 units subcutaneous   for her hyperglycemia.  A liter of fluids given.        CONTINUATION BY DIANA HOULE, PA:     DIAGNOSTIC INTERPRETATIONS:   Urine obtained.  Patient positive for ketones.  Specific gravity of 1.030.     With her ketonuria, acidosis, she was started on an insulin drip and we will   admit her for hydration and treatment of her ketosis.  Call placed to   hospitalist.  Patient admitted, started on insulin drip.     DIAGNOSES:   1.  Diabetic ketoacidosis.   2.  Hematemesis.  3.  Dehydration.    DISPOSITION:  Patient placed in regular bed to the hospitalist, Dr. Langston MaskerMorris.  Patient   evaluated by myself and Dr. Truddie CrumbleStambaugh, who agrees with the above assessment   and plan.      ___________________  Posey Prontoobert E Carlen Fils MD  Dictated By: Windell Hummingbirdiana A. Houle, PA    My signature above authenticates this document and my orders, the final  diagnosis (es), discharge prescription (s), and instructions in the [facility]   Pul[facility]heck record.  Nursing notes have been reviewed by the physician/mid-level provider.    If you have any questions please contact [phone].    DE  D:02/03/2013 12:08:12  T: 02/03/2013 13:33:45  [id number]  Authenticated by Posey Prontoobert E. Lynore Coscia, M.D. On 02/03/2013 02:25:11 PM

## 2013-02-04 LAB — METABOLIC PANEL, COMPREHENSIVE
ALT (SGPT): 16 U/L (ref 12–78)
ALT (SGPT): 16 U/L (ref 12–78)
AST (SGOT): 9 U/L — ABNORMAL LOW (ref 15–37)
AST (SGOT): 12 U/L — ABNORMAL LOW (ref 15–37)
Albumin: 3.1 gm/dl — ABNORMAL LOW (ref 3.4–5.0)
Albumin: 3.3 gm/dl — ABNORMAL LOW (ref 3.4–5.0)
Alk. phosphatase: 105 U/L (ref 45–117)
Alk. phosphatase: 109 U/L (ref 45–117)
BUN: 16 mg/dl (ref 7–25)
BUN: 13 mg/dl (ref 7–25)
Bilirubin, total: 0.6 mg/dl (ref 0.2–1.0)
Bilirubin, total: 0.8 mg/dl (ref 0.2–1.0)
CO2: 19 mEq/L — ABNORMAL LOW (ref 21–32)
CO2: 19 mEq/L — ABNORMAL LOW (ref 21–32)
Calcium: 8.2 mg/dl — ABNORMAL LOW (ref 8.5–10.1)
Calcium: 8.8 mg/dl (ref 8.5–10.1)
Chloride: 106 mEq/L (ref 98–107)
Chloride: 107 mEq/L (ref 98–107)
Creatinine: 1 mg/dl (ref 0.6–1.3)
Creatinine: 0.8 mg/dl (ref 0.6–1.3)
GFR est AA: >60
GFR est AA: >60
GFR est non-AA: >60
GFR est non-AA: >60
Glucose: 256 mg/dl — ABNORMAL HIGH (ref 74–106)
Glucose: 146 mg/dl — ABNORMAL HIGH (ref 74–106)
Potassium: 4.5 mEq/L (ref 3.5–5.1)
Potassium: 4.2 mEq/L (ref 3.5–5.1)
Protein, total: 6.8 gm/dl (ref 6.4–8.2)
Protein, total: 7.1 gm/dl (ref 6.4–8.2)
Sodium: 135 mEq/L — ABNORMAL LOW (ref 136–145)
Sodium: 136 mEq/L (ref 136–145)

## 2013-02-04 LAB — CBC WITH AUTOMATED DIFF
BASOPHILS: 0.3 % (ref 0–3)
EOSINOPHILS: 0 % (ref 0–5)
HCT: 33.2 % — ABNORMAL LOW (ref 37.0–50.0)
HGB: 11 gm/dl — ABNORMAL LOW (ref 13.0–17.2)
IMMATURE GRANULOCYTES: 0.4 % (ref 0.0–3.0)
LYMPHOCYTES: 12 % — ABNORMAL LOW (ref 28–48)
MCH: 30 pg (ref 25.4–34.6)
MCHC: 33.1 gm/dl (ref 30.0–36.0)
MCV: 90.5 fL (ref 80.0–98.0)
MONOCYTES: 3.6 % (ref 1–13)
MPV: 12.5 fL — ABNORMAL HIGH (ref 6.0–10.0)
NEUTROPHILS: 83.7 % — ABNORMAL HIGH (ref 34–64)
NRBC: 0 (ref 0–0)
PLATELET: 225 10*3/uL (ref 140–450)
RBC: 3.67 M/uL (ref 3.60–5.20)
RDW-SD: 40 (ref 36.4–46.3)
WBC: 19.6 10*3/uL — ABNORMAL HIGH (ref 4.0–11.0)

## 2013-02-04 LAB — ACETONE/KETONE, QL: Acetone/Ketone serum, QL.: NEGATIVE

## 2013-02-04 LAB — GLUCOSE, POC
Glucose (POC): 233 mg/dL — ABNORMAL HIGH (ref 65–105)
Glucose (POC): 285 mg/dL — ABNORMAL HIGH (ref 65–105)
Glucose (POC): 252 mg/dL — ABNORMAL HIGH (ref 65–105)
Glucose (POC): 266 mg/dL — ABNORMAL HIGH (ref 65–105)
Glucose (POC): 229 mg/dL — ABNORMAL HIGH (ref 65–105)
Glucose (POC): 245 mg/dL — ABNORMAL HIGH (ref 65–105)
Glucose (POC): 121 mg/dL — ABNORMAL HIGH (ref 65–105)
Glucose (POC): 155 mg/dL — ABNORMAL HIGH (ref 65–105)
Glucose (POC): 208 mg/dL — ABNORMAL HIGH (ref 65–105)
Glucose (POC): 119 mg/dL — ABNORMAL HIGH (ref 65–105)
Glucose (POC): 182 mg/dL — ABNORMAL HIGH (ref 65–105)
Glucose (POC): 246 mg/dL — ABNORMAL HIGH (ref 65–105)

## 2013-02-04 LAB — PROTHROMBIN TIME + INR
INR: 1.2 — ABNORMAL HIGH (ref 0.0–1.1)
Prothrombin time: 14.3 seconds — ABNORMAL HIGH (ref 11.5–14.0)

## 2013-02-04 LAB — HEMOGLOBIN A1C WITH EAG: Hemoglobin A1c: 8.2 % — ABNORMAL HIGH (ref 4.8–6.0)

## 2013-02-04 LAB — PTT: aPTT: 28.5 seconds (ref 24.9–35.6)

## 2013-02-05 LAB — GLUCOSE, POC
Glucose (POC): 276 mg/dL — ABNORMAL HIGH (ref 65–105)
Glucose (POC): 286 mg/dL — ABNORMAL HIGH (ref 65–105)
Glucose (POC): 208 mg/dL — ABNORMAL HIGH (ref 65–105)
Glucose (POC): 129 mg/dL — ABNORMAL HIGH (ref 65–105)
Glucose (POC): 253 mg/dL — ABNORMAL HIGH (ref 65–105)
Glucose (POC): 242 mg/dL — ABNORMAL HIGH (ref 65–105)
Glucose (POC): 213 mg/dL — ABNORMAL HIGH (ref 65–105)
Glucose (POC): 162 mg/dL — ABNORMAL HIGH (ref 65–105)
Glucose (POC): 168 mg/dL — ABNORMAL HIGH (ref 65–105)
Glucose (POC): 186 mg/dL — ABNORMAL HIGH (ref 65–105)
Glucose (POC): 143 mg/dL — ABNORMAL HIGH (ref 65–105)
Glucose (POC): 147 mg/dL — ABNORMAL HIGH (ref 65–105)

## 2013-02-06 LAB — GLUCOSE, POC
Glucose (POC): 225 mg/dL — ABNORMAL HIGH (ref 65–105)
Glucose (POC): 288 mg/dL — ABNORMAL HIGH (ref 65–105)
Glucose (POC): 167 mg/dL — ABNORMAL HIGH (ref 65–105)
Glucose (POC): 200 mg/dL — ABNORMAL HIGH (ref 65–105)
Glucose (POC): 234 mg/dL — ABNORMAL HIGH (ref 65–105)
Glucose (POC): 245 mg/dL — ABNORMAL HIGH (ref 65–105)
Glucose (POC): 197 mg/dL — ABNORMAL HIGH (ref 65–105)
Glucose (POC): 174 mg/dL — ABNORMAL HIGH (ref 65–105)

## 2013-02-07 LAB — GLUCOSE, POC
Glucose (POC): 131 mg/dL — ABNORMAL HIGH (ref 65–105)
Glucose (POC): 203 mg/dL — ABNORMAL HIGH (ref 65–105)
Glucose (POC): 107 mg/dL — ABNORMAL HIGH (ref 65–105)
Glucose (POC): 253 mg/dL — ABNORMAL HIGH (ref 65–105)

## 2013-02-08 LAB — GLUCOSE, POC
Glucose (POC): 156 mg/dL — ABNORMAL HIGH (ref 65–105)
Glucose (POC): 79 mg/dL (ref 65–105)
Glucose (POC): 193 mg/dL — ABNORMAL HIGH (ref 65–105)
Glucose (POC): 164 mg/dL — ABNORMAL HIGH (ref 65–105)

## 2013-03-19 NOTE — H&P (Signed)
[facility]  History [facility] Physical  NAME:  [name] SprinklesMOORE, [name]  SEX:   F  ADMIT: 03/19/2013  DOB:Mar 13, 1985  MR#    [id number]  ROOM:  5206  ACCT#  [id number]    I hereby certify this patient for admission based upon medical necessity as   noted below:    <cc: Jacob MooresBindiya Magoon MD    CHIEF COMPLAINT:  Intractable nausea and vomiting.    HISTORY OF PRESENT ILLNESS:  The patient is a 28 year old black female with a history of type 1 diabetes   mellitus since she was [age].  She was in her usual state of health until she   woke up yesterday with nausea and vomiting.  She was unable to keep anything   down.  Her sugars yesterday morning was 77 and actually her sugars apparently   have been running quite well lately.  Unfortunately, she has these bouts where   her nausea just kicks in. There is no rhythm or reason. She just wakes up   with them. She has been on Reglan in the past but been told not to take it   until it starts up but by time it starts up she is really unable to do   anything and it gets too late. In the emergency room, her sugar was over 400.    Her potassium was high. It was probably a misreading and she has been   admitted for further evaluation and treatment.    ALLERGIES:  SHE IS ALLERGIC BELIEVE OR NOT TO AZITHROMYCIN WHICH CAUSED A RASH AND   PENICILLIN.    PAST MEDICAL HISTORY:  Significant for she is G1, P1, A0, status post C-section.  She has diabetes   type 1 on insulin, uncontrolled, but not badly, intermittent gastroparesis,   hypertension.    FAMILY HISTORY:  She has an aunt with diabetes that is all. Does not know her father.  Her mom   is alive and well without any medical problems.    MEDICATIONS ON ADMISSION:  Include Humalog she uses before meals, Lantus 28 units at bedtime and   lisinopril 20 mg a day.    REVIEW OF SYSTEMS:  Comprehensive review of systems is only as in HPI.    PHYSICAL EXAMINATION:  GENERAL:  A well-developed, well-nourished black female in only mild distress.   VITAL SIGNS:  Blood pressure is 160/70, pulse is 120, but regular,   respirations are about 14.  She is afebrile.  HEENT:  She is normocephalic, atraumatic.  Ears:  Canals are clear   bilaterally.  TMs are opaque.  Eyes:  Sclerae are anicteric.  Pupils round,   reactive to light and accommodation.  Nares are patent.  Mouth is well   hydrated without lesions or exudate.  NECK:  Supple, no thyromegaly or bruit.  CHEST:  Clear to auscultation and percussion.  HEART:  Tachycardic rate, regular rhythm.  No murmur, rub or gallop.  ABDOMEN:  Normal bowel sounds, soft pretty bad tenderness mostly in the   epigastrium.  PELVIC/RECTAL:  Deferred.  EXTREMITIES:  Without clubbing, cyanosis or edema.  NEUROLOGIC:  She is alert and oriented times 3.  Cranial nerves II through XII   intact grossly.  Motor is intact.  Sensation was not tested.    LABORATORY:  Her last glucose was about 238.  She is on a protocol.  ABG when she came in   was 7.346, 25 with a bicarbonate of 13.  This is at  7 a.m. sodium was 138,   potassium of 4.9, chloride 109, bicarbonate 15, glucose was 171, BUN and   creatinine 17 and 1. Calcium is 8.5.  Alkaline phosphatase is 131, lipase is   53, otherwise within normal limits.  Urine pregnancy was negative.    ASSESSMENT AND PLAN:  A 28 year old black female with type 1 diabetes in with DKA, the nausea,   vomiting, hard to know which comes first.  She does have a history of   gastroparesis apparently and is not on any medication. I am going to go ahead   and continue the DKA protocol, give her clear liquids and she thinks she can   maybe keep stuff down and hopefully this will clear.  I had a long discussion   on how to do this.  She has been told not to take the Reglan again until bad   days, but does seem like she wakes up with it so maybe what the game plan   should be is to take it every night, the Reglan and maybe that will help   prevent this from coming on but again who knows which comes first.  I  think   she will turn around well, hopefully be able to get her out in a few days.    Her overall control is not been bad.  2.  Hypertension.  Continue her on her medication.  If she remains on maybe   consider adding carvedilol which is a nice for diabetics.  3. For deep vein thrombosis prophylaxis.  We will go ahead and put her on some   Lovenox just to make sure.  Of note, she declined her flu shot and she has   not had that but she does not have flu symptoms.     I appreciate Dr. Martha Clan referring his patients to the [place] division of   the [facility]ists.      ___________________  Ollen Bowl MD  Dictated By: .   VA  D:03/19/2013 10:00:16  T: 03/19/2013 10:54:03  [id number]  Authenticated by Omar Person, M.D. On 03/19/2013 12:52:17 PM

## 2013-03-19 NOTE — ED Provider Notes (Signed)
[facility]  EMERGENCY [facility]MENT TREATMENT REPORT  NAME:  Lamar SprinklesMOORE, [name]  SEX:   F  ADMIT: 03/19/2013  DOB:   [birth year]  MR#    [id number]  ROOM:  [id number]  TIME DICTATED: 06 45 AM  ACCT#  [id number]        I hereby certify this patient for admission based upon medical necessity as   noted below.      PRIMARY CARE PHYSICIAN:  Mike GipScott Yagel, MD    TIME OF EVALUATION:  [phone].    CHIEF COMPLAINT:  Abdominal pain, vomiting.    HISTORY OF PRESENT ILLNESS:  This is a 28 year old female with a history of having brittle type 1 diabetes   and episodes of DKA.  She states she has been vomiting for most of the day.    She thinks she may be going into DKA at this point.    REVIEW OF SYSTEMS:  CONSTITUTIONAL:  No fever, chills, or weight loss.   EYES:   No visual symptoms.   ENT:  No sore throat, runny nose, or other URI symptoms.   ENDOCRINE:  The patient's blood sugars have been running high.  She thinks she   might be going into DKA.  HEMATOLOGIC/LYMPHATIC:  No excessive bruising or lymph node swelling.  RESPIRATORY:  No cough, shortness of breath, or wheezing.   CARDIOVASCULAR:  No chest pain, chest pressure, or palpitations.   GASTROINTESTINAL:  The patient has nausea and vomiting times about 12 hours.  GENITOURINARY:  No dysuria, frequency, or urgency.   MUSCULOSKELETAL:  No joint pain or swelling.   INTEGUMENTARY:  No rashes.   NEUROLOGICAL:  No headaches, sensory or motor symptoms.     PAST MEDICAL HISTORY:  Significant for hypertension, type 1 diabetes, gastroparesis, and cesarean   section.    SOCIAL HISTORY:  Lives at home with family.  Denies smoking.  Consumes alcohol socially, denies   drug use.    FAMILY HISTORY:  Noncontributory.    ALLERGIES:   REVIEWED IN IBEX.     CURRENT MEDICATIONS:  Reviewed in Ibex.    PHYSICAL EXAMINATION:  VITAL SIGNS:  Blood pressure 191/107, pulse 115, respirations 22, temperature   98.2, pain 10, O2 saturation is 100% on room air.   GENERAL APPEARANCE:  This is an ill-appearing 28 year old female, actively   vomiting on my arrival to the room.  HEENT:  Eyes:  Conjunctivae are injected.  Pupils are equal, symmetrical and   normally reactive.  NECK:  Symmetrical.  RESPIRATORY:  Clear and equal breath sounds.  No respiratory distress,   tachypnea, or accessory muscle use.     CARDIOVASCULAR:  Heart is tachycardic, regular, no murmurs or rubs   auscultated.  CHEST:  Symmetrical, nontender.  ABDOMEN:  Scaphoid, soft, nondistended, nontender.  SKIN:  Warm and dry without rashes.   NEUROLOGIC:  She is alert and oriented.  Motor strength equal and symmetric.    CONTINUATION BY Jewell Haught Jimmey RalphPARKER, MD    INITIAL ASSESSMENT:  Probable diabetic ketoacidosis.  She is admitted for this frequently.  We are   going to give her IV fluids, start her on the DKA workup and then disposition   her based on her response to symptomatic treatment as well as the results of   her workup.    DIAGNOSTIC STUDIES:  Urinalysis shows 500 glucose, trace blood, greater than 300 protein.    Pregnancy test is negative.  CBC:  White count is elevated at 15.5 with 90%  neutrophils, otherwise unremarkable.  Her i-STAT shows a sodium of 124,   potassium greater than 9, chloride 107, CO2 of 17, glucose 405, BUN 28,   creatinine 0.7, ionized calcium 3.6.  Her lipase is normal.  Her VBG pH is   7.346, pCO2 is 24, pO2 is 198, bicarbonate is 13.  O2 saturation 99%, base   deficit is -10.    EMERGENCY DEPARTMENT COURSE:  The patient did receive IV fluids as well as started on a diabetic   ketoacidosis protocol.  I did call.  She is going to be admitted to Dr.   Wenda Low onto a diabetic bed.  She does appear to be improving; however, we do   need to get her base deficit to close as well as get her CO2 to normalize.    CLINICAL IMPRESSION:  Diabetic ketoacidosis.    DISPOSITION:  She is admitted to a regular bed on the service of Dr. Wenda Low.    PRIMARY CARE PHYSICIAN:  Mike Gip, MD     TIME OF EVALUATION:  72.      ___________________  Johny Drilling MD  Dictated By: Wynelle Attleboro. Towns, PA-C    My signature above authenticates this document and my orders, the final  diagnosis (es), discharge prescription (s), and instructions in the [facility]   Pulsecheck record.  Nursing notes have been reviewed by the physician/mid-level provider.    If you have any questions please contact [phone].    RA  D:03/19/2013 06:45:40  T: 03/19/2013 07:32:49  [id number]  Authenticated by Johny Drilling, M.D. On 03/22/2013 03:19:54 PM

## 2013-03-24 NOTE — Discharge Summary (Signed)
[facility]  Discharge Summary   NAME:  [name] [name], [name]  SEX:   F  ADMIT: 03/19/2013  DISCH:   DOB:   16-Oct-1985  MR#    [id number]  ACCT#  [id number]    cc: Jacob MooresBindiya Magoon MD, Mike GipScott Yagel MD    DATE OF ADMISSION:    03/19/2013    DATE OF DISCHARGE:    03/24/2013    Please refer to admission history and physical by Dr. Omar PersonJulius Miller for   pertinent past medical problems as well as details leading to this hospital   stay.    Briefly, the patient is a 28 year old female with longstanding type 1 diabetes   since age 28.  She has had prior admissions with diabetic ketoacidosis.  She   also has a history of hypertension.  Diabetic gastroparesis.  She presented to   the emergency room 03/19/2013, complaining of one day of severe nausea and   vomiting.  She tried taking some Reglan, although was not able to keep this   down.  On presentation to the emergency room she had a temperature of 98.2,   blood pressure 191/107, pulse 115, O2 saturation 100%.  Second set of vital   signs in the emergency room showed a blood pressure 118/57, pulse 100, O2   saturation 99%.      Initial laboratory studies showed WBC 15.5, hemoglobin 13.3, hematocrit 39.5,   platelets 311.  Sodium 138, potassium 4.9, chloride 109, CO2 15, glucose 171,   BUN 17, creatinine 1, SGOT 24, SGPT 18, alkaline phosphatase 131, albumin 3.5.    This was actually after she had been started on IV insulin infusion.  Her   initial laboratory studies in the emergency room showed sodium 124, chloride   107, CO2 17, glucose 405, BUN 28, creatinine 0.7.  Urinalysis showed 500 mg   per deciliter of glucose, greater than 160 mg per deciliter of ketones,   specific gravity greater than 1.030 and greater than 300 mg per deciliter of   protein.  Urine pregnancy test was negative.  ABG showed pH 7.34, pCO2 24, pO2   198, O2 saturation 99%.  She was admitted for further evaluation and   treatment of intractable nausea and vomiting associated with diabetic    ketoacidosis.    She was started on IV insulin infusion and IV fluids, electrolytes and sugars   were monitored closely.  She was started on IV Reglan for the intractable   nausea, vomiting, presumably related to her gastroparesis.    Within 48 hours her acidosis corrected and electrolytes normalized, she   continued to have bouts with nausea and vomiting which kept her in the   hospital a couple of extra days.  It seemed the IV Reglan did seem to help.    She has been afebrile through hospital stay.  Her vital signs have been   stable.  Today, she denies nausea.  She has had no vomiting in over 18 hours.    She is tolerating food and liquids without difficulty.  On examination, her   abdomen is soft and nontender.    MOST RECENT LABORATORY STUDIES:  On 03/23/13 WBC 9.2, hemoglobin 11.5, hematocrit 34.2, platelets 244.  Sodium   136, potassium 3.3, chloride 102, CO2 25, glucose 117, BUN 12, creatinine 1.1.    DISCHARGE DIAGNOSES:  1.  Diabetic ketoacidosis, resolved.  2.  Type 1 diabetes.  3.  Diabetic gastroparesis.  4.  Hypertension.    DISCHARGE MEDICATIONS:  Lisinopril 20 mg daily, Norco 5/325 one q.6 h. p.r.n. severe pain (#20   tablets), Phenergan 25 mg q.6 h. p.r.n. nausea and vomiting, Reglan 10  a.c.   and at bedtime p.r.n. nausea, Humalog sliding scale to cover before meals,   Lantus 28 units at bedtime.    She should follow up with her primary care physician, Dr. Martha Clan in 7 to 14   days and with Dr. Nolon Nations from gastroenterology to follow up on gastroparesis as   previously scheduled.    Total time to coordinate discharge was 40 minutes.      ___________________  Charisse Klinefelter MD  Dictated By: .   Davonna Belling  D:03/24/2013 12:10:18  T: 03/24/2013 12:51:54  [id number]  Authenticated by Dorothe Pea, M.D. On 03/24/2013 01:05:19 PM

## 2013-05-05 NOTE — ED Provider Notes (Signed)
[facility]  EMERGENCY [facility] TREATMENT REPORT  NAME:  [name] SprinklesMOORE, Noura  SEX:   F  ADMIT: 05/05/2013  DOB:   March 27, 1985  MR#    [id number]  ROOM:    TIME DICTATED: 02 46 PM  ACCT#  [id number]        PRIMARY CARE PHYSICIAN:  None.    TIME OF EVALUATION:  1411.    CHIEF COMPLAINT:  Left foot pain.    HISTORY OF PRESENT ILLNESS:  A 28 year old female presents complaining of a 2-day history of an 8 out of 10  left foot pain.  She states that she was going upstairs and she twisted her  foot.  It is constant, throbbing pain.  She tried ice with no relief.  Denies  any paresthesias.    REVIEW OF SYSTEMS:  MUSCULOSKELETAL:  Positive for left foot pain.  INTEGUMENTARY:  No laceration.  NEUROLOGICAL: No lower extremity paresthesias.    PAST MEDICAL HISTORY:  Diabetes, hypertension, gastroparesis, C-section, wrist surgery.    FAMILY HISTORY:  Unrelated.    SOCIAL HISTORY:  Nonsmoker.    ALLERGIES:  PENICILLIN AND ZITHROMAX.    MEDICATIONS:  Humalog, Lantus, lisinopril, Reglan.    PHYSICAL EXAMINATION:  VITAL SIGNS:  Blood pressure 153/98, pulse 101, respiratory rate 18,  temperature 97.7, O2 saturation 100% on room air.  Pain is an 8 out of 10.  GENERAL APPEARANCE:  This is a well-developed, well-nourished 28 year old  female resting comfortably.  EXTREMITY:  On the patient's left lateral foot, she has tenderness along the  fifth metatarsal.  There is minimal soft tissue swelling. No ecchymosis  overlying skin lesions or lacerations.  Dorsal pedal pulses are 2+ and  symmetrical bilaterally.  Sensation is intact.    CONTINUED BY KARA M. DeMOTT, PA-C:     INITIAL ASSESSMENT AND MANAGEMENT PLAN:    A 28 year old female presents with left foot pain.  She has pain along the  left 5th metatarsal.  At this time, we will obtain x-rays to evaluate for  acute bony abnormalities and treat her for pain.    EMERGENCY DEPARTMENT DIAGNOSTICS:     Left foot x-ray read as negative by Dr. Cipriano MilePitrolo.     COURSE IN THE EMERGENCY DEPARTMENT:  The patient remained stable while in our care.  She received 600 mg ibuprofen  p.o. for pain, was placed in a postop cast shoe and given crutches with  instruction.    FINAL DIAGNOSIS:  Left foot injury.    DISPOSITION AND PLAN:  The patient was discharged home in stable condition.  Given prescription for  800 mg of Motrin.  Counseled to ice, elevate, use crutches, given Dr. Miles CostainShick,  PCP to follow up with and Dr. Jerilynn BirkenheadKirven, orthopedist, to follow up with.  Stable  at this time.  The patient was personally evaluated by myself and Dr. Cipriano MilePitrolo  who agrees with the above assessment and plan.      ___________________  Elsie Saasavid A Brianah Hopson MD  Dictated By: Guy SandiferKara M. DeMott, PA-C    My signature above authenticates this document and my orders, the final  diagnosis (es), discharge prescription (s), and instructions in the [facility]  Pulsecheck record.  Nursing notes have been reviewed by the physician/mid-level provider.    If you have any questions please contact [phone].    [facility]  D:05/05/2013 14:46:01  T: 05/05/2013 17:15:05  [id number]  Electronically Authenticated and Linus OrnEdited by:  Cliffton Astersavid A. Jannet Calip, M.D. On 05/06/2013 01:15 PM EDT

## 2013-05-10 NOTE — ED Provider Notes (Signed)
[facility]  EMERGENCY [facility] TREATMENT REPORT  NAME:  [name] [name], [name] [name]  SEX:   F  ADMIT: 05/10/2013  DOB:   1985-06-12  MR#    [id number]  ROOM:  [id number]  TIME DICTATED: 08 11 AM  ACCT#  [id number]        I hereby certify this patient for admission based upon medical necessity as  noted below:     DATE AND TIME OF EVALUATION:  05/10/2013 at 6:46 a.m.    CHIEF COMPLAINT:  Nausea, vomiting, diarrhea, and back pain.    HISTORY OF PRESENT ILLNESS:  The patient is a 28 year old female with history of diabetes and  gastroparesis, coming in for 2 days of vomiting.  She has back pain when she  vomits.  Denies any abdominal pain, diarrhea, dysuria or abnormal discharge.  She has a history of insulin-dependent diabetes and denies checking her blood  sugars over the last couple of days.  She states she has not felt well enough  to check her blood sugars or give herself her medications.  She denies any  prior abdominal surgeries.  No recent fevers.    REVIEW OF SYSTEMS:  CONSTITUTIONAL:  No fevers.  EYES:   No visual symptoms.   ENT:  No sore throat, runny nose, or other URI symptoms.   ENDOCRINE:  She is an insulin-dependent diabetic.  Denies checking sugars  lately.  RESPIRATORY:  No shortness of breath or cough.  CARDIOVASCULAR:  No chest pain.  GASTROINTESTINAL:  No abdominal pain.  Positive vomiting.  No diarrhea.    GENITOURINARY:  No dysuria or discharge.  MUSCULOSKELETAL:  Back pain with vomiting.  INTEGUMENTARY:  No rashes.  NEUROLOGIC:  No headache.    PAST MEDICAL HISTORY:  Insulin-dependent diabetes, gastroparesis, hypertension.    FAMILY HISTORY:   The patient does not provide.    SOCIAL HISTORY:   She lives at home.  Denies alcohol, tobacco or drug use.    ALLERGIES:   REVIEWED IN IBEX.     MEDICATIONS:  Reviewed in Ibex.     PHYSICAL EXAMINATION:  VITAL SIGNS:  Blood pressure 168/95, pulse 134, respirations 20, temperature  97.4, saturations 100% on room air.   CONSTITUTIONAL:  The patient is a thin 28 year old female. She is lying on the  bed on her left side.  It looks like she does not feel well.    ENT:  Oral mucosa is dry in appearance.  Teeth and gums unremarkable.  NECK:  Supple, symmetrical.  Trachea is midline.  RESPIRATORY:  Clear and equal breath sounds.  No respiratory distress or  accessory muscle use.   Mild tachypnea with no wheezes, rales or rhonchi.  CARDIOVASCULAR:  Tachycardic but regular with no appreciable murmur, rubs,  gallops or thrills.  GASTROINTESTINAL:  Abdomen is soft.  She is tender in bilateral upper  quadrants to palpation with no guarding or rebound.  When I do palpate, she  starts to act like she may vomit again.  She has no CVA tenderness to  percussion.   SPINE:  There is no localized cervical, thoracic, lumbar or sacral body  tenderness to palpation or fist percussion.  There are no bony step-offs,  ecchymosis, areas of soft tissue swelling or deformities.  No muscle spasms  appreciated to palpation of the back or reproducible discomfort on exam.    STANCE AND GAIT:  She walks to and from the restroom bent over holding her  abdomen but able to ambulate without assistance.  SKIN:  Warm and dry.    NEUROLOGICAL:  She is appropriately oriented and responsive.    INITIAL ASSESSMENT AND MANAGEMENT PLAN:   This is a 28 year old female with history of insulin-dependent diabetes, who  is very tachycardic and mildly tachypneic on assessment.  She has been  vomiting for 2 days.  I am concerned for acute DKA in this patient.  She does  not have any real abdominal pain per report and on exam, very mild tenderness  in the upper quadrants appreciated with negative Murphy's sign and no  guarding.  We will start with a urine and i-STAT, a CBC, and, if needed,  proceed with a VBG.  We will start her on some fluids, antiemetics, pain  medicine and insulin as needed once we receive her results.    CONTINUATION BY Dixie DialsBEN Jakira Mcfadden, MD     INITIAL ASSESSMENT:  A patient with poorly controlled diabetes with a history of recurrent DKA  likely presents with the same.   This is a new problem for this patient.      RESULTS:  Lipase was 53.  The i-STAT 8 shows a sodium at 129, potassium 6.6, bicarbonate  of 17, glucose of 500, BUN of 35.  Ionized calcium 3.9.  Otherwise normal.  Pregnancy was negative.  Urinalysis was negative for signs of infection.      MEDICAL DECISION-MAKING AND HOSPITAL COURSE:   The patient was given 2 liters of IV fluid, IV Ativan, and Zofran and started  on an insulin infusion.  She has been stable, although appearing ill over  time.  She will need to be admitted for control of her DKA complicated by her  gastroparesis.  I have spoken with Dr. Trinidad Curetatarkina, who agrees to accept the  patient in admission.    DISPOSITION:  Admitted in satisfactory condition.    DIAGNOSES:   1.  Acute diabetic ketoacidosis.  2.  Intractable nausea and vomiting.   3.  Acute hyperkalemia.      ___________________  Christiana PellantBen A Geneva Barrero MD  Dictated By: Bettey MareShannon R. Earlene PlaterWallace, PA-C    My signature above authenticates this document and my orders, the final  diagnosis (es), discharge prescription (s), and instructions in the [facility]  Pulsecheck record.  Nursing notes have been reviewed by the physician/mid-level provider.    If you have any questions please contact [phone].    DE  D:05/10/2013 08:11:21  T: 05/10/2013 12:54:59  [id number]  Electronically Authenticated by:  Carlis StableBen A. Carmela HurtFickenscher, M.D. On 05/16/2013 09:56 PM EDT

## 2013-06-09 NOTE — ED Provider Notes (Signed)
[facility]  EMERGENCY DEPARTMENT TREATMENT REPORT  NAME:  [name] [name], [name] [name]  SEX:   F  ADMIT: 06/09/2013  DOB:   [birth year]  MR#    [id number]  ROOM:    TIME DICTATED: 09 48 AM  ACCT#  [id number]        DATE OF SERVICE:  06/09/2013    HISTORY OF PRESENT ILLNESS/CHIEF COMPLAINT:  This is a 28 year old female who presents to Emergency Department complaining  of left upper quadrant pain associated with nausea, vomiting and back pain for  the last day.  The patient has known history of insulin-dependent diabetes as  well as gastroparesis; has been seen in this emergency department for similar  symptoms for the last 2 months.      The patient states the symptoms came on last night.  She is unable to take any  of her p.o. medications due to the fact that she is vomiting so much, has had  approximately 7 to 8 episodes of vomiting with continued retching since that  time.  Denies any fever or chills, denied any chest pain or shortness of  breath, abdominal pain is located in the left lower quadrant with radiation to  lower quadrants.  Denies any lower extremity pain, but does complain of back  pain.      REVIEW OF SYSTEMS:  CONSTITUTIONAL:  No fever, chills, or weight loss.  ENDOCRINE:  The patient is a known diabetic questionable symptoms, nausea,  vomiting could be related to hyperglycemia.  ALLERGIC/IMMUNOLOGIC:  No urticaria or allergy symptoms.  RESPIRATORY:  No cough, shortness of breath, or wheezing.  CARDIOVASCULAR:  No chest pain, chest pressure, or palpitations.  GASTROINTESTINAL:  The patient complaining of abdominal pain in the left upper  quadrant associated with vomiting and nausea.  GENITOURINARY:  No dysuria, frequency, or urgency.  MUSCULOSKELETAL:  The patient complaining of bilateral lower back pain.  INTEGUMENTARY:  No rashes.  NEUROLOGICAL:  No headaches, sensory or motor symptoms.  PSYCHIATRIC:  No suicidal or homicidal ideation.    PAST MEDICAL HISTORY:   The patient has a past medical history of hypertension on lisinopril, diabetes  on Lantus and Humalog,  and known gastroparesis.    FAMILY AND SOCIAL HISTORY:  Unremarkable.    PHYSICAL EXAMINATION:  VITAL SIGNS:  Blood pressure 181/105, pulse 100, respirations 18, O2  saturation 100% on room air, temperature 98.1.  GENERAL APPEARANCE:  Patient appears well developed and well nourished.  Appearance and behavior are age and situation appropriate.  HEENT:   Ears/Nose:  Hearing is grossly intact to voice.  Internal and  external examinations of the ears and nose are unremarkable.  Mouth/Throat:  Surfaces of the pharynx, palate, and tongue are pink, moist, and without  lesions   RESPIRATORY:  Clear and equal breath sounds.  No respiratory distress,  tachypnea, or accessory muscle use.  CARDIOVASCULAR:   Tachycardic without murmurs, gallops or rubs.  No thrills  heard.  Carotid pulses are 2+ and equal bilaterally without bruits.    CHEST:  Chest symmetrical without masses or tenderness.  GASTROINTESTINAL:  Abdomen is soft.  There is tenderness to palpation only in  the left upper quadrant with radiation to the midline.  There is no pain  reproduced with palpation to any other quadrants.  There is no hepato  splenomegaly noted.  No abdominal or inguinal masses noted or appreciated by  inspection or palpation.   MUSCULOSKELETAL:   Stance and gait appeared normal.  SKIN:  Warm and dry without rashes.  NEUROLOGIC:  Alert, oriented.  Sensation intact, motor strength equal and  symmetric.  There is no facial asymmetry or dysarthria.   PSYCHIATRIC:  Judgment appears appropriate.  Recent and remote memory appear  to be intact. Oriented to time, place and person.  Mood and affect   appropriate.     INITIAL ASSESSMENT AND MANAGEMENT PLAN:  This is a 28 year old female that presents to Emergency Department complaining  of left upper quadrant pain associated with nausea, vomiting was last night   with onset of back pain with continued retching patient has known  gastroparesis and insulin-dependent diabetic.  She has not taken her  medications today and presents to the Emergency Department hypertensive.  She  has known hypertension.  She was taking lisinopril.  This patient is a known  insulin-dependent diabetic.      Initial workup and management will be started towards any type of  hyperglycemia or induced DKA due to the patient not feeling well and skipping  medication doses.  Additionally, as she has known gastroparesis, her symptoms  will be treated with that.  Workup will include CBC, CMP, lipase, urine  pregnancy test, fingerstick.  The  patient will be given IV fluids,  antiemetics and pain medication.  Additionally, she will be given p.o.  medication when her nausea and vomiting stops of her hypertensive medication.  The patient will be constantly reevaluated and followed up on.    CONTINUATION BY Romona CurlsMATTHEW S. Gregoire Bennis, M.D.     DATE OF SERVICE:  06/09/2013     DIAGNOSTIC RESULTS:  CBC is remarkable only for a slightly elevated white count of 13.  Glucose is  179 on fingerstick.  Urine is significant for glucose as well as trace blood.  CMP is remarkable only for elevated glucose of 191.  Lipase is negative at 109  not indicate acute pancreatitis.  The patient is not pregnant on beta hCG,  urine in the Emergency Department.  Chest x-ray shows no acute abdominal or  thoracic abnormality.      REEVALUATION:  The patient was reevaluated multiple times in the Emergency Department with  her injury and with her nausea and pain.  In the ED, she received 1 liter IV  fluid as well as Pepcid, Zofran, Reglan and Benadryl for nausea and vomiting,  most likely associated with gastroparesis.  Additionally, the patient was  having back pain likely becasuse she was wretching all night.  She was given   one  dose of morphine which stopped her pain.     As patient has negative laboratory investigation into DKA as well as known  gastroparesis without any electrolyte abnormalities and control of nausea and  vomiting here in the Emergency Department, she is amenable to discharge to   home.    CLINICAL IMPRESSION AND DIAGNOSES:  1.  Nausea and vomiting.  2.  Hypertension.  3.  Gastroparesis.  4.  Back pain.    DISPOSITION AND PLAN:  This patient has diagnosed with nausea and vomiting, most likely associated  with her gastroparesis without any electrolyte abnormalities at this time.  Her nausea and vomiting was controlled in the Emergency Department.  The  patient will be discharged home with prescription of Reglan for both  gastroparesis, nausea and vomiting and GERD type symptoms.  In the Emergency  Department, the hypertensive at to a maximum of 194.  She has known  hypertensive at baseline, and is treated at home with  Lisinopril.  She has not  taken medication in the past 2 days.  She was given a dose of Lisinopril in  the Emergency Department which reduced her blood pressure.  She has no  evidence of any end-organ damage.  There is no hypertensive complaint  indicating admission for hypertension and will be referred to continue  treatment as an outpatient.  The patient will be discharged home with a  prescription for Reglan as mentioned as well as follow up with her primary  care doctor and GI physician and return to the Emergency Department for any  worsening in her condition.  At time of disposition, the patient was stable.      ___________________  Romona CurlsMatthew S Mozes Sagar MD  Dictated By: Marland Kitchen.     My signature above authenticates this document and my orders, the final  diagnosis (es), discharge prescription (s), and instructions in the [facility]  Pul[facility]ck record.  Nursing notes have been reviewed by the physician/mid-level provider.    If you have any questions please contact [phone].    IKM  D:06/09/2013 09:48:00  T: 06/09/2013 18:27:30  [id number]   Electronically Authenticated and Linus OrnEdited by:  Buck MamMATTHEW Brinsley Wence, MD On 06/23/2013 02:34 PM EDT

## 2013-06-10 NOTE — ED Provider Notes (Signed)
[facility]ER[facility] T[facility]ATMENT REPORT  NAME:  [name] [name], [name] [name]  SEX:   F  ADMIT: 06/10/2013  DOB:   [birth year]  MR#    [id number]  ROOM:  [id number]  TIME DICTATED: 07 48 AM  ACCT#  [id number]    cc: Mike GipScott Yagel MD    I hereby certify this patient for admission based upon medical necessity as  noted below:      PRIMARY CARE PHYSICIAN:  None.    CHIEF COMPLAINT:  Abdominal pain, vomiting.    HISTORY OF PRESENT ILLNESS:  This is a 28 year old female with a history of insulin-dependent diabetes as  well as gastroparesis, who returns due to ongoing nausea and vomiting as well  as abdominal pain.  She was seen here in the Emergency Department yesterday  and treated for her gastroparesis.  She was discharged home with a  prescription for Reglan.  She states at time of discharge, she was mildly  improved. Upon arrival home, vomiting returned.  She states that she has been  vomiting every hour, since being discharged.  She is not throwing up any  blood.  She is also to complaining of left upper quadrant pain that is sharp  and nonradiating.  She reports lower back pain and muscle spasms.  She has not  had a bowel movement in 2 days, but states that she has been passing gas.  Denies fevers or chills, no chest pain or shortness of breath.  Returns at  this time for further evaluation and management.    REVIEW OF SYSTEMS:  CONSTITUTIONAL:  No fevers or chills.  EYES:   No visual symptoms.   ENT:  No URI symptoms.  RESPIRATORY:  No dyspnea.  CARDIOVASCULAR:  No chest pain.  GASTROINTESTINAL:  As above.  GENITOURINARY:  No dysuria.  MUSCULOSKELETAL:  Back pain.  INTEGUMENTARY:  No rash.  NEUROLOGICAL:  No headache.    PAST MEDICAL HISTORY:  Hypertension, insulin-dependent diabetes, gastroparesis.    SOCIAL HISTORY:  Nonsmoker.    MEDICATIONS:  Reviewed in Ibex.    ALLERGIES:  REVIEWED IN IBEX.    PHYSICAL EXAMINATION:  VITAL SIGNS:  Blood pressure 172/107, pulse 114, respirations 20, temperature   97.7, O2 sats 98% on room air, pain rated 8 out of 10.  GENERAL APPEARANCE:  Patient appears well developed and well nourished.  Appearance and behavior are age and situation appropriate.  She is resting on  the stretcher, appears uncomfortable, but nontoxic.  HEENT:  Head NC/AT.  Eyes:  Conjunctivae are clear, nonicteric.  Mouth and  throat:  Oral mucosa is pink and dry.    No cervical or submandibular lymphadenopathy palpated.   RESPIRATORY:  Lungs are clear to auscultation bilaterally, no wheezing.  CARDIOVASCULAR:  Tachycardic, regular rhythm.  No murmurs.  GASTROINTESTINAL:  Normal active bowel sounds in all 4 quadrants.  Abdomen is  soft, not appear distended.  There is very mild discomfort with palpation in  the left upper quadrant, as well as very mild in the epigastrium, but no  additional areas of tenderness.  There is no rebound or guarding.  MUSCULOSKELETAL:  She moves all extremities spontaneously and without  difficulty.  SKIN:  Warm and dry.  She is not diaphoretic.  There is no jaundice or rash.  NEUROLOGIC:  The patient is alert, oriented and answers questions  appropriately.  Motor strength is 5/5 to bilateral upper and lower   extremities.    CONTINUATION BY Dixie DialsBEN Eulalie Speights, MD:  INITIAL ASSESSMENT:  A patient with gastroparesis, who continues to be ill, despite being seen and  hydrated and treated yesterday.  This is a new problem for this patient.      RESULTS:  CMP:  Sodium 133, glucose of 388, calcium 8, AST of 9, albumin of 3, otherwise  normal. Lipase was normal.  CBC:  White count of 21 with a left shift,  otherwise normal. Urinalysis negative for signs of infection, but does show  ketones.      MEDICAL DECISION-MAKING/HOSPITAL COURSE:  The patient continues to have nausea and vomiting, despite receiving IV  fluids, IV Ativan, IV insulin, IV Pepcid, IV Valium, Reglan.  I think that she  is going to require inpatient admission.  I believe her elevated white count   and ketosis evidences the fact that she has ascitic throughout the night.  I  have spoken with Dr. Terrace ArabiaYan who is going to come see the patient.  He has asked  that I add a lactate and a VBG, which I have done.  I do not believe she  requires imaging of her upper abdomen, but he is going to come and evaluate   her.    DISPOSITION:  Admitted in satisfactory condition.    DIAGNOSES:  1.  Acute intractable nausea and vomiting.  2.  Acute dehydration.  3.  Gastroparesis.  4.  Diabetes with hyperglycemia.      ___________________  Christiana PellantBen A Osiel Stick MD  Dictated By: Zachary GeorgeMichelle M. Pernell DupreAdams, PA-C    My signature above authenticates this document and my orders, the final  diagnosis (es), discharge prescription (s), and instructions in the [facility]  Pul[facility]ck record.  Nursing notes have been reviewed by the physician/mid-level provider.    If you have any questions please contact [phone].    SU  D:06/10/2013 07:48:06  T: 06/10/2013 19:07:45  [id number]  Electronically Authenticated by:  Carlis StableBen A. Carmela HurtFickenscher, M.D. On 06/29/2013 09:36 AM EDT

## 2013-06-10 NOTE — H&P (Signed)
[facility]  History and Physical  NAME:  Lamar SprinklesMOORE, [name]  SEX:   F  ADMIT: 06/10/2013  DOB:1985/05/24  MR#    [id number]  ROOM:  5213  ACCT#  [id number]    I hereby certify this patient for admission based upon medical necessity as  noted below:    <    DATE OF ADMISSION:  06/10/2013    CHIEF COMPLAINT:  Abdominal pain, nausea and vomiting.    HISTORY OF PRESENT ILLNESS:  The patient is a 28 year old African American female with history significant  for diabetes type 1 on insulin, hypertension, gastroparesis, recurrent DKA,  who presented with a complaint of nausea, vomiting and diarrhea.  The patient  reported that her symptoms started the day before yesterday, persistent nausea  and vomiting, cannot take any medication more than 10 times.  The patient came  to the emergency room to be evaluated.  Abdominal x-ray did not show acute  change.  The patient received appropriate pain management teaching, and  discharged home.  Now started the pains again and persistent nausea and  vomiting, cannot tolerate any p.o. with left upper quadrant abdominal pain.  The patient returned to the emergency room this morning.  In the emergency  room, the patient was found and leukocytosis with WBC 21, glucose elevation,  but no evidence of DKA.  We will kept n.p.o., started IV and IV fluids.  Plan  to admit patient to the hospital for further management.  The patient denied  any chest pain.  No shortness of breath, no fever, no chills.    PAST MEDICAL HISTORY:  Diabetes type 1, hypertension.    ALLERGIES:  PENICILLIN, AZITHROMYCIN and VANCOMYCIN.    SOCIAL HISTORY:    No smoking, no alcohol.  Denied any drug abuse.    FAMILY HISTORY:  Aunt with diabetes and otherwise unremarkable.    MEDICATIONS AT HOME:  1.  Reglan 10 mg 3 times a day.  2.  Humalog sliding scale before meals and at bedtime.  3.  Lantus 28 units subcutaneous at night.  4.  Lisinopril 20 mg once a day.  5.  Phenergan 25 mg q.6 h. p.r.n.   6.  Lopressor 25 mg b.i.d.    REVIEW OF SYSTEMS:  No fever, chills, no headache, no blurry vision.  No cough, no sputum, no  hemoptysis, no chest pain, no palpitations.  Positive for abdominal pain as  mentioned in H&P.  No polyuria or polydipsia.  No hot and cold intolerance.  No skin rash or mass.  No easy bleeding or bruise.  No seizures and no focal  weakness.  No hallucination.    PHYSICAL EXAMINATION:  GENERAL:  A pleasant lady with mild distress.  VITAL SIGNS:  Blood pressure 112/60, heart rate 107, respiratory 18,  temperature 97.7, O2 sat 99% on room air.  HEENT:  Pupils equal, round, reactive to light.  NECK:  Supple, no JVD, no thyromegaly, no carotid artery bruits.  CHEST:  Clear to auscultation, no wheezing.  HEART:  S1, S2 regular rate and rhythm, no murmur or gallop.  ABDOMEN:  Soft, bowel sounds normal, left upper quadrant tender and no   rebound.  EXTREMITIES:  No edema, no clubbing.  NEUROLOGIC:  Alert, oriented times 3 with no focal deficit.    LABORATORY TESTS:  WBC 21.2, hemoglobin and hematocrit 13.9 and 41.2, platelets 319.  Sodium is  133, potassium 3.9, chloride 102, carbon dioxide 21, BUN 22, creatinine 1.1,  glucose 388,  lipase 50.  UA sugar is more than 500.  Specific gravity 1.025,  nitrite and leukocyte esterase negative.  Pregnancy test negative.  Abdominal  x-ray:  No acute abdominal and thoracic abnormality.    ASSESSMENT AND PLAN:  1.  Persistent nausea, vomiting and abdominal pain last night with  gastroparesis.  Admit the patient to the hospital.  I will start a clear  liquid diet and IV fluids, Zofran and Benadryl for nausea.  Avoid narcotic  medications which will make the gastroparesis worse.  Get  abdominal and  pelvic CT due to the leukocytosis.  2.  Leukocytosis, possible related to with nausea, vomiting but need to rule  out infection.  Will get blood culture and get abdominal and pelvic CT and  follow up results.   3.  Diabetes type 1, insulin-dependent.  We will resume the home dose of  insulin including Lantus and sliding scale.  4.  Hypertension.  Pressure under control.  Resume her antihypertensive  medication, monitor pressure closely.  5.  Deep venous thrombosis prophylaxis.    PATIENT'S CODE status is FULL CODE.    Total time spent for this admission is 72 minutes.      ___________________  [name] HolmsJieming Teryn Gust MD  Dictated By: .   IKM  D:06/10/2013 15:41:17  T: 06/10/2013 18:14:22  [id number]  Electronically Authenticated by:  [name] HolmsJIEMING Samon Dishner, MD On 06/17/2013 09:18 AM EDT

## 2013-06-25 NOTE — H&P (Signed)
[facility]  Stat History and Physical  NAME:  [name] [name], [name] [name]  SEX:   F  ADMIT: 06/25/2013  DOB:10-Feb-1985  MR#    [id number]  ROOM:  5209  ACCT#  [id number]    I hereby certify this patient for admission based upon medical necessity as  noted below:    <    DATE OF ADMISSION:  06/25/2013    CHIEF COMPLAINT:  Vomiting.    HISTORY OF PRESENT ILLNESS:  The patient is a 28 year old female with type 1 diabetes mellitus, recurrent  DKA who comes in because of vomiting.  Patient is a poor historian. The   patient states that her symptoms started yesterday when she had multiple   episodes of vomiting, no blood in her vomit, associated with abdominal   discomfort.  She states that this is similar to her previous episodes. She   came in, her glucose was noted to be 570, according to ER, so she was started   on an insulin drip.  I do not see this 570 in paragon. Her initial blood gas   showed a pH 7.328, pCO2 of 22.4, pO2 38.6, bicarbonate 11.8.  Urinalysis was   positive for ketones.  The patient denies any fever, any shortness of breath.   When asked if she takes her medications she does not respond.     PAST MEDICAL HISTORY:  DKA, type 1 diabetes mellitus, gastroparesis, hypertension.    PAST SURGICAL HISTORY:  Cesarean section.    ALLERGIES:  PENICILLIN, AZITHROMYCIN.    MEDICATIONS:  Includes metoclopramide, humalog premeal, lantus 28 units at bedtime,   lisinopril, phenergan, metoprolol unknown doses.    SOCIAL HISTORY:  Denies alcohol or tobacco use.    FAMILY HISTORY:  Aunt has diabetes mellitus.    REVIEW OF SYSTEMS:  A 12-point review of system done.  Pertinent positive and negative was stated  in HPI.    PHYSICAL EXAMINATION:  GENERAL:  The patient in bed.  VITAL SIGNS:  Blood pressure 157/97, heart rate 108, respiratory rate 18,  temperature 97 degrees Fahrenheit, oxygen saturation 100% on room air.  HEENT:  Normocephalic, atraumatic.  NECK:  Supple.  LUNGS:  Clear.  CARDIOVASCULAR:  Regular rhythm.   ABDOMEN:  Soft, nontender.  EXTREMITIES:  No edema.  NEUROLOGIC:  Awake, alert and oriented times 3.  Follows commands, moves all  extremities.    LABORATORY EXAMINATION:  ABG showed pH 7.328, pCO2 of 22.4, pO2 38.6, bicarbonate 11.8.  Fingerstick  glucose 424.  Urine pregnancy negative.  Urinalysis showed glucose 500,  ketones 80.  No BMP or CBC available in paragon.    ASSESSMENT AND PLAN:  1.  Hyperglycemia, rule out diabetic ketoacidosis. Check CMP stat, check  CBC stat.  2.  Vomiting, rule out urinary tract infection.  Check urine culture.  3.  Rule out pancreatitis.  Check lipase.  4.  Rule out hepatitis.  Check CMP stat.  5.  Metabolic acidosis.  Check lactate level.  6.  Type 1 diabetes mellitus on insulin drip per protocol.  Continue IV  fluids.  Check hemoglobin A1c.  Counseled.  7.  Suspect medication non-adherence. Reinforced diabetes education.   Counseled.   Further recommendations based on clinical course and lab results.      ___________________  Knox Royalty MD  Dictated By: .   Shearon Stalls  D:06/25/2013 20:44:30  T: 06/25/2013 75:64:33  [id number]  Electronically Authenticated and Edited by:  Norberta Keens, MD On 06/26/2013 05:50 AM EDT

## 2013-06-26 NOTE — ED Provider Notes (Signed)
[facility]  EMERGENCY DEPARTMENT TREATMENT REPORT  NAME:  [name] [name]  SEX:   F  ADMIT: 06/25/2013  DOB:   21-Nov-1985  MR#    [id number]  ROOM:  5209  TIME DICTATED: 04 28 PM  ACCT#  [id number]    cc: Jacob Moores MD    I hereby certify this patient for admission based upon medical necessity as  noted below:      TIME OF EVALUATION:   1600    PRIMARY CARE PHYSICIAN:   Dr. Martha Clan.    CHIEF COMPLAINT:   Dry heaving, back pain, chest pain.    HISTORY OF PRESENT ILLNESS:   This is a 28 year old female well known to this Emergency Department for  diabetic related issues, DKA.  She has had back pains before.  She was  actually seen her twice in the past 2 weeks on 05/15 and 05/14 for nausea,  vomiting and dehydration.  She tells me that she does not check as she  supposed to and has not really been checking it much recently because she has  not been feeling well the past 24 hours.  This all started, the dry heaving,  back pain, chest pain about 24 hours ago now.  The chest pain and the back  pain started exactly the same time as the dry heaving.  She says she has  actually thrown up twice, but she continues to have the dry heaves.  She is  also having polydipsia and polyuria.  The last time she said she checked her  sugar it was in the vicinity of the mid-200s.  She otherwise denies any  fevers.  No cough.  No abdominal pain.  No diarrhea.  She tells me that the  pain she is having in her back is a 10/10 constant sharp pain, the same in her  chest.   The patient denies trauma.    REVIEW OF SYSTEMS:   CONSTITUTIONAL:  No fever, chills, or weight loss.   HEENT:  EYES:   No visual symptoms.  ENT:  No sore throat, runny nose, or  other URI symptoms.    ENDOCRINE:  No diabetic symptoms.  Positive for polydipsia and polyuria.  HEMATOLOGIC/LYMPHATIC:   No excessive bruising or lymph node swelling.   ALLERGIC/IMMUNOLOGIC:  No urticaria or allergy symptoms.    RESPIRATORY:  No cough, shortness of breath, or wheezing.   CARDIOVASCULAR:  Positive for musculoskeletal type chest pain.    GASTROINTESTINAL:  No vomiting, diarrhea, or abdominal pain.   GENITOURINARY:  No dysuria, frequency, or urgency.   MUSCULOSKELETAL:  Positive for back pain.  INTEGUMENTARY:  No rashes.   NEUROLOGICAL:  No headaches, sensory or motor symptoms.     PAST MEDICAL HISTORY:   Gastroparesis, diabetes, hypertension.      PAST SURGICAL HISTORY:    No surgical history.    PSYCHIATRIC HISTORY:   No psychiatric history.    FAMILY HISTORY:   Noncontributory in this case.    SOCIAL HISTORY:   The patient does not smoke, drink alcohol or use illicit drugs.    ALLERGIES:  SHE IS ALLERGIC TO ZITHROMAX AND PENICILLINS.    MEDICATIONS:  Lantus.    PHYSICAL EXAMINATION:   VITAL SIGNS:  Blood pressure 157/97, pulse 108, respirations 18, temperature  97.0, saturating at 100% room air.  CONSTITUTIONAL:  This is a well-developed, well-nourished 28 year old female  lying comfortably in the bed.  She does not appear to be in any acute  distress.  She is cooperative with the evaluation.    HEENT:  Eyes:  Conjunctivae clear, lids normal.  Pupils equal, symmetrical,  and normally reactive.  ENT:  The patient's mucous membranes are a bit dry as  are her lips.  Clinically, she appears to be a little dehydrated.    RESPIRATORY:  Clear and equal breath sounds.  No respiratory distress,  tachypnea, or accessory muscle use.     CARDIOVASCULAR:   Heart regular, without murmurs, gallops, rubs, or thrills  though she is a bit tachycardic at a rate of 108.    GASTROINTESTINAL:  Abdomen soft, nontender, without complaint of pain to  palpation.  No hepatomegaly or splenomegaly.   MUSCULOSKELETAL:  The patient has tenderness to palpation in the midportion of  her back; however, no ecchymosis, crepitus, stepoff or hematoma.  She also has  some reproducible pain in the anterior chest wall midsternum.  Again, no   crepitus, ecchymosis, hematoma.    SKIN:  Warm and dry without rashes.  NEUROLOGIC:  The patient is alert and oriented times 3.    CONTINUATION BY Stephan MinisterMOLLY MATHEWS, MD:     INITIAL ASSESSMENT AND MANAGEMENT PLAN:   This patient is a 28 year old female  frequently admitted for diabetic  ketoacidosis who comes in today with her typical symptoms.  She started  yesterday with dry heaves and vomiting.  She has been unable to keep anything  down.  She is having some low back pain which she states she normally gets  when she has her diabetic ketoacidosis.  She is frequently admitted for this.  She has no abdominal pain.  She looks dehydrated, but nontoxic.    DIAGNOSTIC STUDIES:   The i-STAT on arrival revealed a sodium of 131, potassium of 5.3, chloride of  99, BUN of  24, glucose of 572, creatinine of 0.9, bicarbonate of 16,  hemoglobin 13.9, hematocrit 41%.  Urine is negative for infection, but she  does have 80 ketones and 500 glucose in her urine, urine pregnancy test was  negative. Chest x-ray  was unremarkable for any acute abnormalities.  Venous  blood gas revealed a pH of 7.32, pCO2 of 22.4, bicarbonate of 11.8.  CO2 of   13.    EMERGENCY DEPARTMENT COURSE:  On arrival, the patient was clinically dehydrated and was tachycardic.  On  arrival because the patient look clinically dehydrated we started a liter of  normal saline and then repeated that as well to continue rehydrating the  patient, she was having her low back spasms which are not new for her, so she  was given Valium 5 mg IV for that, insulin drip was started per protocol for  the hyperglycemia associated with acidosis.    CONSULTATION:  I discussed the case with Dr. Wenda LowMatriano who will be admitting the patient for  further evaluation and management of her DKA and dehydration.  He requested  formal basic metabolic panel, hepatic function panel and lipase and he will  follow up on those results.  In the meantime, the patient is feeling better   after some hydration.    CLINICAL IMPRESSION:  1.  Acute diabetic ketoacidosis.  2.  Dehydration.    DISPOSITION AND PLAN:  The [facility] further  inpatient management.  Dr. Lynn ItoMariano will follow up on the BMP, LFTs and lipase  that were ordered.      ___________________  Carin HockMolly J Mathews MD  Dictated By: Fransisca KaufmannJeffrey Padgett, PA-C  My signature above authenticates this document and my orders, the final  diagnosis (es), discharge prescription (s), and instructions in the [facility] record.  Nursing notes have been reviewed by the physician/mid-level provider.    If you have any questions please contact [phone].    rm  D:06/25/2013 09:81:19  T: 06/26/2013 14:78:29  [id number]  Electronically Authenticated by:  Nehemiah Massed. Jerolyn Center, M.D. On 07/22/2013 12:20 PM EDT

## 2013-06-30 NOTE — Discharge Summary (Addendum)
[facility]  Discharge Summary   NAME:  [name], [name]  SEX:   F  ADMIT: 06/25/2013  DISCH: 06/29/2013  DOB:   30-Jun-1985  MR#    [id number]  ACCT#  [id number]    cc: . Marland Kitchen     DATE OF ADMISSION:  06/25/2013    DATE OF DISCHARGE:   The patient left AMA on 06/29/2013.    DIAGNOSES:   1.  Recurrent diabetic ketoacidosis due to uncontrolled diabetic mellitus type  1.  The patient's hemoglobin A1c during this admission was 8.4  2.  Associated hyperkalemia and hyponatremia.  3.  Gastroparesis secondary to uncontrolled diabetic mellitus type 1.  4.  Uncontrolled hypertension.    HOSPITAL COURSE:  A 28 year old female with type 1 diabetes was again admitted to the hospital  due to vomiting and was diagnosed with DKA.  We placed her on DKA protocol and  electrolyte replacement.  She had hypokalemia, hypomagnesemia, and  hyponatremia prior to discharge.  Initially when she was admitted, she had  hyperkalemia which improved.  Yesterday, we were able to finally stop the  insulin drip and we were planning on changing her to subcutaneous insulin.  However, she decided to leave the hospital AMA.  She still had electrolyte  deficiency including hypomagnesemia and hypophosphatemia.  The patient left  the hospital AMA.      ___________________  Christel Mormon MD  Dictated By: .   DE  D:06/30/2013 07:49:58  T: 06/30/2013 10:28:00  [id number]  Electronically Authenticated and Edited by:  Ashiya Kinkead R. Leontae Bostock, M.D. On 06/30/2013 01:44 PM EDT

## 2013-07-20 NOTE — ED Provider Notes (Signed)
[facility]  EMERGENCY DEPARTMENT TREATMENT REPORT  NAME:  [name] [name]  SEX:   F  ADMIT: 07/20/2013  DOB:   [birth year]  MR#    [id number]  ROOM:  [id number]  TIME DICTATED: 06 54 AM  ACCT#  [id number]        I hereby certify this patient for admission based upon medical necessity as  noted below.       CHIEF COMPLAINT:  Back pain, nausea, vomiting.    HISTORY OF PRESENT ILLNESS:     This is a 28 year old female with a history of type 1 diabetes and diabetic  ketoacidosis, who presents with nausea and vomiting.  She states that she has  been vomiting all day.  She states that she started to have epigastric  abdominal pain that radiated to her back beginning this morning.  She has been  unable to eat or drink.  She has not taken anything to try and relieve her  symptoms.  She denies any dysuria, urgency, frequency.  She denies any fevers,  chills, malaise.    REVIEW OF SYSTEMS:  CONSTITUTIONAL:  No fever, chills.  ENT:  No sore throat, no dysphasia.   HEMATOLOGIC/LYMPHATIC:   No excessive bruising or lymph node swelling.  ALLERGIC/IMMUNOLOGIC:  No urticaria or allergy symptoms.   RESPIRATORY:  No cough, shortness of breath, or wheezing.  CARDIOVASCULAR:  No chest pain, chest pressure, or palpitations.  GASTROINTESTINAL:  She is complaining of nausea, vomiting, abdominal pain in  the epigastric area.  GENITOURINARY:  No dysuria, frequency, or urgency.   MUSCULOSKELETAL:  No joint pain or swelling.  SKIN:  No rashes, abrasions, ecchymosis.  NEUROLOGIC:  No headache, dizziness, syncope, paralysis.  Denies complaints in all other systems.     PAST MEDICAL HISTORY:  Type 1 diabetes, hypertension, gastroparesis.    SURGICAL HISTORY:  None    PSYCHIATRIC HISTORY:     None.    SOCIAL HISTORY:  No smoking.  Denies alcohol use.  Denies drug use.    ALLERGIES:  PENICILLIN, ZITHROMAX.    MEDICATIONS:  Lisinopril, insulin.    PHYSICAL EXAMINATION::  VITAL SIGNS:  Blood pressure is 140/100, pulse 108, respirations 18,   temperature 98.3, pain is 10 out of 10, O2 sats 100% on room air.  GENERAL APPEARANCE:  A 28 year old female who is wandering around the ER.  HEENT:   Mouth/Throat:  Surfaces of the pharynx, palate, and tongue are pink,  moist, and without lesions.    HEART:  Regular rate and rhythm.  LUNGS:  Clear to auscultation.  ABDOMEN:  Soft, minimally tender.  No distention.    MUSCULOSKELETAL:    She moves all of her extremities with ease and without   pain.  SKIN:  Clean, dry and intact without rashes, abrasions, ecchymosis.    NEUROLOGIC:  Alert, oriented.  Sensation intact, motor strength equal and  symmetric.   There is no facial asymmetry or dysarthria.     PSYCHIATRIC:  Judgment appears appropriate.   Recent and remote memory appear  to be intact.  Oriented to time, place and person.  Mood and affect  appropriate.     INITIAL ASSESSMENT:   The patient is complaining of nausea, vomiting, and abdominal pain.  She is a  type 1 diabetic, need to rule out DKA.  So, check urinalysis, give the patient  fluids.  She was given antiemetics and workup was initiated.  Past medical  records were reviewed.     CONTINUATION BY  Kaydenn Mclear A Dennison Bulla, MD  07/20/2013      INITIAL ASSESSMENT:  This is a patient who is chronically uncontrolled type 1 diabetic who has  chronic pain and chronic gastroparesis and significant amount of psychiatric  overlay.  She looks ill.  We are going to check for DKA.  This is a new  problem for this patient.    RESULTS:  Urinalysis shows ketones and glucose; no signs of infection.  Her CBC white  count is 21,000; otherwise normal.  Lipase is normal.  CMP:  Sodium  128,  potassium is 4.7, bicarb of 18, glucose of 397, alk phos of 139, total protein  8.6, otherwise normal.    MEDICAL DECISION MAKING/HOSPITAL COURSE:  The patient was given IV fluids.  She was started on insulin drip per the DKA  protocol.  I have ordered 2 L of fluid.  She received IV morphine and IV   Zofran.  I have spoken with BJ, the nurse practitioner with Dr. Dawson Bills, who  agrees to accept the patient  in admission.    DISPOSITION:  Admitted in satisfactory condition.    DIAGNOSES:  1.  Acute DKA (diabetic ketoacidosis).  2.  Acute on chronic pain.  3.  Intractable nausea and vomiting.      ___________________  Lennart Pall MD  Dictated By: Alvino Blood, PA-C    My signature above authenticates this document and my orders, the final  diagnosis (es), discharge prescription (s), and instructions in the [facility] record.  Nursing notes have been reviewed by the physician/mid-level provider.    If you have any questions please contact [phone].    [facility]  D:07/20/2013 06:54:30  T: 07/20/2013 17:53:00  [id number]  Electronically Authenticated by:  Annice Needy. Dennison Bulla, M.D. On 07/26/2013 06:20 AM EDT

## 2013-07-20 NOTE — H&P (Signed)
[facility]  History and Physical  NAME:  [name] SprinklesMOORE, [name]  SEX:   F  ADMIT: 07/20/2013  DOB:[birth year]  MR#    [id number]  ROOM:  5125  ACCT#  [id number]    I hereby certify this patient for admission based upon medical necessity as  noted below:    <    CHIEF COMPLAINT:  Nausea, vomiting.    HISTORY OF PRESENT ILLNESS:  The patient is a 28 year old female, type 1 diabetes poorly controlled,  multiple admissions for DKA in the past who comes in because of nausea and  vomiting.  The patient states this started approximately 3 to 4 days ago.  She  had nausea followed by some vomiting since that time, has been poorly able to  tolerate any food, has been able to tolerate some liquids in the form of ice  chips and small sips of water.  Patient came to the Emergency Department, was  noted to have elevated blood sugars, was initially admitted with diagnosis of  DKA, but on review of systems, there is no ketoacidosis, so admission  diagnosis correctly would be elevated blood sugars, nausea, vomiting and  dehydration.    REVIEW OF SYSTEMS:  The patient admits to some chills in the last few days, nausea, vomiting, back  pain at about the level of the thoracic spine T3-T4, occasional belly pains  that she states she gets sometimes with her gastroparesis.  No chest pain, no  shortness of breath.  She was constipated but did have a bowel movement as of  yesterday.  Remainder of 10-point review of systems reviewed with patient and  negative.    PAST MEDICAL HISTORY:  DKA multiple episodes, multiple admissions; Type 1 diabetes uncontrolled;  gastroparesis; hypertension.    PAST SURGICAL HISTORY:  C-section.    ALLERGIES:  PENICILLIN, AZITHROMYCIN.    MEDICATIONS AT HOME:  As per computerized patient record system includes metoclopramide 10 mg before  meals t.i.d., Humalog 1 unit before meals and bedtime, Lantus 28 units subcu  at bedtime, lisinopril 20 mg daily, Phenergan 25 mg q.6 h. p.r.n.,   hydrocodone, acetaminophen q.6 h. p.r.n., promethazine 25 mg q.6 h.,  Metoprolol 25 mg b.i.d.    FAMILY HISTORY:  Family members with diabetes, multiple.    SOCIAL HISTORY:  Denies alcohol, denies tobacco.  Denies illicit drug use.    PHYSICAL EXAMINATION:  VITAL SIGNS:  When seen on floor 180/99, pulse of 111, breathing at 18, 100%  on room air, 98.3.  GENERAL:  Some mild distress secondary to pain, alert and oriented times 3.  HEENT:  Normocephalic, atraumatic.  Pupils equal, round, reactive to light.  Mucous membranes are dry.  NECK:  Supple, without JVD.  No carotid bruits appreciated.  CARDIOVASCULAR:  Regular rate rhythm.  S1, S2, no murmurs, no gallops, no   rubs.  CHEST:  Clear to auscultation bilaterally.  No wheezes, rhonchi or rales.  ABDOMEN:  Soft minimal diffuse tenderness, no guarding, no rebound.  BACK:  Some tenderness along the T4-T6 area, more pronounced on the right.  No  specific vertebral tenderness.  LYMPH NODES:  No cervical, axillary or groin lymphadenopathy.  EXTREMITIES:  No lower extremity swelling.  All extremities are warm and well  perfused.  PSYCHIATRIC:  The patient is appropriate for situation, insight appears   intact.  NEUROLOGIC:  The patient moves all 4 extremities spontaneously.  Eyes track.  Face is symmetrical.  Tongue is midline.  This is a grossly nonfocal  neurological exam.    LABORATORY:  Data has been reviewed.  Point of care UA unremarkable.  White blood cell  elevated at 20.9, sodium 128, CO2 18, anion gap 11, creatinine 1.3.  Remainder  of labs reviewed.  No imaging for review.    ASSESSMENT:  1.  Intractable nausea and vomiting in patient with uncontrolled type 1  diabetes, history of gastroparesis.  The patient is not in DKA but does have  severe dehydration due to recent nausea, vomiting and elevated blood sugars.  The patient was started on insulin drip in the Emergency Department which has   been discontinued.  The patient will be aggressively resuscitated with IV  fluids.  We will resume Lantus and sliding scale insulin as well as premeal  aspart.  Patient at this time feels she can tolerate a regular diet.  Antiemetics will be provided.  2.  Leukocytosis with a white blood cell count of 30.  This is unexplained.  It may be stress reaction due to elevated blood sugars and nausea and  vomiting.  We will obtain chest x-ray as well as renal ultrasound and spine  films to rule out any acute pathology contributing though this is likely  stress reaction.  3.  History of gastroparesis.  Continue Reglan 10 mg t.i.d. with meals.  4.  Hypertension.  This is accelerated and uncontrolled.  Resume patient's  metoprolol.  Resume patient's lisinopril.  We will add p.r.n. hydralazine.  5.  Deep venous thrombosis prophylaxis will be provided with low molecular  weight heparin.      ___________________  Kristie Cowmanoy P Azaylea Maves MD  Dictated By: .   LH  D:07/20/2013 12:55:06  T: 07/20/2013 14:23:02  [id number]  Electronically Authenticated by:  Wilmon ArmsOY Jenaro Souder, MD On 08/03/2013 01:34 PM EDT

## 2013-08-01 NOTE — ED Provider Notes (Signed)
[facility]  EMERGENCY DEPARTMENT TREATMENT REPORT  NAME:  [name] [name], [name]  SEX:   F  ADMIT: 08/01/2013  DOB:   Nov 13, 1985  MR#    [id number]  ROOM:    TIME DICTATED: 05 15 PM  ACCT#  [id number]    cc: Jerral BonitoWei Zhao MD    TIME OF EVALUATION:  1324.    FAMILY DOCTOR:  Jerral BonitoWei Zhao, MD    CHIEF COMPLAINT:  Nausea, vomiting.    HISTORY OF PRESENT ILLNESS:  A 28 year old female with a history of gastroparesis, insulin-dependent  diabetes, frequent visits to our Emergency Department for vomiting in DKA,  presents today stating she felt no symptoms yesterday, but today she has  thrown up over 10 times left upper abdominal pain and chills and presents to  the Emergency Department for further evaluation.  She describes left upper  abdominal pain as severe cramping, stabbing and constant in nature with no  particular exacerbating or relieving factors other than active vomiting.    REVIEW OF SYSTEMS:  CONSTITUTIONAL:  Chills.  No known fever.    EYES:  No visual symptoms.  ENT:  No sore throat, runny nose, or other URI symptoms.  RESPIRATORY: No cough, shortness of breath, or wheezing.   CARDIOVASCULAR:  No chest pain, chest pressure, or palpitations.  GASTROINTESTINAL:  As above.  MUSCULOSKELETAL:  No neck, back or extremity pain.  INTEGUMENTARY:  No rashes.  NEUROLOGIC:  Headache, generalized, gradual onset, similar to previous.  No  photophobia or phonophobia.  No unilateral weakness, slurred speech, facial  droop or altered mental status.    PAST MEDICAL HISTORY:  Gastroparesis, insulin-dependent diabetes, hypertension, DKA.    SOCIAL HISTORY:  Nonsmoker.  Denies any alcohol or drug use other than marijuana, which she  uses regularly.    FAMILY HISTORY:  Unknown.    ALLERGIES:  PENICILLIN AND ZITHROMAX.    CURRENT MEDICATIONS:  Humalog, Lantus, lisinopril.     PHYSICAL EXAMINATION:  VITAL SIGNS:  Blood pressure is 161/110, pulse 102, respirations 22,  temperature 97.7, pain 9 out of 10, O2 sats 100% on room air.   CONSTITUTIONAL: The patient ambulated calmly into the room where I was waiting  to evaluate her.  Once she was on the bed, she began to writhe in pain at  times requesting pain medicine.  She did vomit once while I was in the room.  Otherwise, the patient appears somewhat thin, appearance, behavior otherwise  age and situation appropriate.  HEENT: Eyes:  Conjunctivae clear, lids normal. Pupils equal, symmetrical and  normally reactive. Ears/Nose: Hearing is grossly intact to voice.  Internal  and external examination of the ears and nose are unremarkable. Mouth/Throat:  Surfaces of the pharynx, palate, and tongue are pink, moist, and without  lesions. Nasal mucosa, septum and turbinates unremarkable. Teeth and gums  unremarkable.   RESPIRATORY: Clear and equal breath sounds.  No respiratory distress,  tachypnea, or accessory muscle use.  CARDIOVASCULAR:  Heart regular without murmurs, gallops, rubs or thrills.  GASTROINTESTINAL: Abdomen soft, tender to palpation left upper quadrant.  No  other abdominal tenderness, rebound or guarding.  MUSCULOSKELETAL:  SPINE:  There is no localized cervical, thoracic, lumbar or  sacral body tenderness to palpation or fist percussion. There are no bony  step-offs, ecchymosis, areas of soft tissue swelling or deformities.  MUSCULOSKELETAL: Stance and gait appear normal.   SKIN: Warm and dry without lesions.   NEUROLOGIC: Alert, oriented. Sensation intact, motor strength equal and  symmetric.  INITIAL ASSESSMENT AND MANAGEMENT PLAN:  A 28 year old female upper abdominal pain. We will screen for hepatitis,  pancreatitis and infectious or metabolic abnormalities. Medicate  symptomatically.  I do not suspect appendicitis.  There is no right lower  abdominal tenderness. I do not suspect colitis.  The patient has not had any  diarrhea and has had actually a normal bowel movement yesterday. We will  screen for DKA.  Headache is generalized, gradual onset, similar to previous.   I do not suspect meningitis or subarachnoid hemorrhage.  She had no sudden  onset.  She has no fevers here.  She is not complaining of any neck stiffness  and has no photophobia.    DIAGNOSTIC STUDIES:  CBC showed white count of 19.7, hemoglobin 12.6, mean platelet volume of 12.5,  neutrophils 92.1%, lymphocytes 5.8% otherwise unremarkable. Urine pregnancy  negative.  Urinalysis showed 500 glucose, 80 ketones, 30 protein, otherwise  normal.  Urine drug screen showed positive for marijuana, otherwise normal.  CMP shows sodium 134, glucose of 318, alkaline phosphatase 120, lipase normal  at 82.    COURSE:  The patient remained stable in the Emergency Department, did not develop other  symptoms, informed of results. She was medicated with 1 liter of fluids, IV  medication infusion of Benadryl, Phenergan and Ativan an additional 1 liter of  fluid.  The patient was resting. On recheck, she did ambulate to the bathroom  without any difficulty, approximately 30 minutes later. She was given 10 units  subcutaneously of Humalog, continued to appear well.  Her friends did confide  in me that she was concerned the patient may be using drugs so urine drug  screen was obtained to rule this out, which was essentially negative as above.  As there did not appear to be any CO2 retention or signs of DKA and the  patient was subsequently discharged.    FINAL DIAGNOSES:  1.  Intractable vomiting.  2.  Type 1 diabetes uncontrolled, no complications.   3.  Dehydration.    DISPOSITION AND TREATMENT PLAN:  Discharge instructions given with a prescription for Reglan. The patient is  discharged home in stable condition with instructions to follow up with their  regular doctor. They are advised to return immediately for any worsening or  symptoms of concern.     The patient was personally evaluated by myself and Dr. Vinnie LangtonErik Jarah Pember who agrees  with the above assessment and plan.     ASSESSMENT AND MANAGEMENT:   The patient presents here with vomiting all day today.  No vomiting yesterday  and normal bowel movement this morning.  She has history of diabetes mellitus,  hypertension.  DKA in the past.  The patient evaluated further to see if she  is developing acidosis again and will be treated symptomatically.  No one else  is ill at home.    LABORATORY TESTS:  White count elevated 19.7, H&H 37 and 12.  Pregnancy was negative.  Urinalysis had 500 glucose, otherwise negative for infection; shows positive  for marijuana but negative on tox screen otherwise.   Sugar 318.  Electrolytes  essentially normal.  Lipase is 82.    EMERGENCY DEPARTMENT COURSE:  The patient was seen here, was hydrated with IV fluids; given Phenergan 25 mg  IV, Benadryl 25 mg IV and Ativan 1 mg IV.  She stopped vomiting.  Is  tolerating some fluid.  After 2 liters felt much better.  Abdomen is  completely nontender.  She denies any  pain.  She was up and ambulated to the  bathroom.  She is going to continue subcutaneous Humalog for her elevated  glucose.  She wanted to get home; was discharged to return if symptoms  worsened again.  Given prescription for Reglan to use.    CLINICAL IMPRESSION:  1.  Acute intractable vomiting with dehydration.  2.  Type 1 diabetes mellitus uncontrolled without complication.      ___________________  Smitty Cords MD  Dictated By: Mearl Latin Lockie Pares, PA-C    My signature above authenticates this document and my orders, the final  diagnosis (es), discharge prescription (s), and instructions in the [facility]  Pulsecheck record.  Nursing notes have been reviewed by the physician/mid-level provider.    If you have any questions please contact [phone].    [facility]  D:08/01/2013 17:15:13  T: 08/01/2013 22:30:15  [id number]  Electronically Authenticated by:  Smitty Cords, M.D. On 08/02/2013 06:57 PM EDT

## 2013-08-02 NOTE — H&P (Signed)
[facility]  History [facility] Physical  NAME:  [name] SprinklesMOORE, [name]  SEX:   F  ADMIT: 08/02/2013  DOB:August 27, 1985  MR#    [id number]  ROOM:  6610  ACCT#  [id number]    I hereby certify this patient for admission based upon medical necessity as  noted below:    <cc: Jerral BonitoWei Zhao MD    PRIMARY CARE PHYSICIAN:  Jerral BonitoWei Zhao, MD    CHIEF COMPLAINT:  Nausea, vomiting and back pain.    HISTORY OF PRESENT ILLNESS:  This is a 28 year old female with past medical history significant for  diabetes mellitus type 1 as well as multiple DKA require admissions and  gastroparesis and hypertension, complaining of nausea, vomiting and back pain.  The patient stated she was in her normal state of health up to yesterday.  Yesterday she started having nausea, vomiting and abdominal pain.  Denies any  fever, chills.  No anterior chest pain, shortness of breath.  Denies any  diarrhea or constipation or abdominal pain.    PAST MEDICAL HISTORY:  1.  Diabetes mellitus type 1.  2.  DKA with multiple episodes.    3.  Gastroparesis.  4.  Hypertension.    PAST SURGICAL HISTORY:  C-section.    ALLERGIES:  1.  PENICILLIN.  2.  AZITHROMYCIN.    FAMILY HISTORY:  Significant for diabetes mellitus.    SOCIAL HISTORY:  Denies alcohol, smoking or illicit drug use.    HOME MEDICATIONS:  1.  Phenergan 25 mg p.o. q.6 h. p.r.n.  2   Metoclopramide 10 mg p.o. before meals and at bedtime.    2.  Promethazine 25 mg p.o. q.6 h. p.r.n.  3.  Humalog subcutaneous before meals and at bedtime.    4.  Lantus 28 units subcutaneous at bedtime.  5.  Lisinopril 20 mg p.o. daily.    REVIEW OF SYSTEMS:  A 12-point review of system was done.  Pertinent positive and negative was  stated in HPI.    PHYSICAL EXAMINATION:  VITAL SIGNS:  Blood pressure 178/90, oxygen saturation 98%, heart rate 105,  respiratory rate 22, temperature 98.  GENERAL:  The patient is in moderate distress, alert and oriented times 3.  HEENT:  Head:  Normocephalic, atraumatic.   NOSE AND SINUSES:  No tenderness, no discharge.  MOUTH AND THROAT:  No erythema, no edema, no tonsillar enlargement.  NECK:  Trachea midline.  Thyroid normal limits.  No JVD, no carotid bruits.  LUNGS:  Clear to auscultation bilaterally without wheezing, rales or rhonchi.  CARDIOVASCULAR:  Regular rate and rhythm, no murmurs, gallops or clicks.  ABDOMEN:  Positive bowel sounds, soft, nontender, nondistended.  EXTREMITIES:  No edema, no calf tenderness.  VASCULAR:  Dorsalis pedis pulses palpable and symmetric bilaterally.  NEURO:  Cranial nerves II-XII negative and intact.  Deep tendon reflexes 2 out  of 4, symmetric bilaterally.  PSYCHIATRIC:  No mood swings, no anxiety or depression.  SKIN:  No erythema noted.  No edema.  MUSCULOSKELETAL:  Positive muscle tenderness thoracic area in the back.    LABORATORY DATA:  WBC 22.5, hemoglobin 12.7, hematocrit 38.1, platelet 289.  Sodium 134,  potassium 4.2, chloride 101, bicarbonate 22, glucose 294, BUN 16, creatinine  1.  Lipase 45.  AST 12, ALT 14.  Chest x-ray, normal chest.    ASSESSMENT AND PLAN:  1.  Intractable nausea and vomiting secondary to gastroparesis.  We will start  patient on anti-emesis.  We will avoid pain medications if possible.  2.  Back pain,  likely secondary to intractable nausea and vomiting causing  muscle strain.  We will start the patient on a muscle relaxer, Flexeril  scheduled and will also keep patient on heating pad, avoid narcotics.  3.  Diabetes mellitus type 1, uncontrolled with multiple diabetic ketoacidosis  episodes.   Continue Lantus, follow up as outpatient.  The patient is not in  DKA currently.  4.  Gastroparesis in exacerbation.  5.  Hypertension, controlled.  6.  Deep venous thrombosis prophylaxis with Lovenox.  7.  Hyponatremia secondary to nausea and vomiting.  8.  The patient is FULL CODE.      ___________________  Faythe GheeLisa Quanell Loughney DO  Dictated By: .   MLT  D:08/02/2013 12:35:47  T: 08/02/2013 13:11:20  [id number]   Electronically Authenticated by:  Harley HallmarkLISA Thayne Cindric,DO On 08/03/2013 06:45 AM EDT

## 2013-08-02 NOTE — ED Provider Notes (Signed)
[facility]  EMERGENCY [facility]MENT TREATMENT REPORT  NAME:  [name] SprinklesMOORE, Indiya  SEX:   F  ADMIT: 08/02/2013  DOB:   1985/03/30  MR#    [id number]  ROOM:  6610  TIME DICTATED: 02 56 AM  ACCT#  [id number]    cc: Jerral BonitoWei Zhao MD    I hereby certify this patient for admission based upon medical necessity as  noted below:      TIME OF EVALUATION:  0112     PRIMARY CARE PHYSICIAN:  Dr. Dion BodyZhao    CHIEF COMPLAINT:  Back pain, nausea.    HISTORY OF PRESENT ILLNESS:  This is a 28 year old type 1 diabetic, history of gastroparesis, presenting to  the Emergency Department today secondary to back pain and vomiting.  The  patient states that she was seen earlier today with similar complaint.  Apparently had onset of pain around 6 a.m. described as sharp, associated with  numerous episodes of vomiting.  She was seen here in the Emergency Department,  given medications which helped while she was here, but has not taken any  medications since she has been discharged and has continued to vomit.  She  denies any hematemesis, denies any diarrhea, but she is able to move her  bowels.  Due to symptoms is here for further evaluation.    REVIEW OF SYSTEMS:  CONSTITUTIONAL:  No fever.  EYES:  No visual symptoms.   ENT:  No sore throat, runny nose or other URI symptoms.    RESPIRATORY:  No shortness breath.  CARDIOVASCULAR:  No chest pain.  GASTROINTESTINAL:  Positive for abdominal pain and vomiting, no diarrhea.  GENITOURINARY:  No dysuria.  MUSCULOSKELETAL:  Positive for back pain.  INTEGUMENTARY:  No rashes.  NEUROLOGIC:  Minimal headache.    PAST MEDICAL HISTORY:  History of diabetes, gastroparesis, hypertension.    SOCIAL HISTORY:  Nonsmoker.    FAMILY HISTORY:  Noncontributory.    MEDICATIONS:  Reviewed.    ALLERGIES:  PENICILLIN AND ZITHROMAX.    PHYSICAL EXAMINATION:  VITAL SIGNS:  Blood pressure 158/120, pulse 133, respirations 20, temperature  98.4, pain 9 out of 10, O2 saturation 100% on room air.   GENERAL:  The patient well nourished, well developed, answering questions  appropriately, no acute distress.  HEENT:  Mouth:  Mucous membranes somewhat moist.  RESPIRATORY:  Clear and equal breath sounds.  No respiratory distress,  tachypnea or accessory muscle use.   CARDIOVASCULAR:  Heart regular, without murmurs, gallops, rubs or thrills.  GASTROINTESTINAL:  Normoactive bowel sounds.  Abdomen soft, some generalized  tenderness but mainly reproducible in the epigastric region.  No rebound or  guarding is appreciated.  MUSCULOSKELETAL:  No localized midline tenderness to the back.  No  reproducible flank pain.  The patient's pain was reproducible to the low  lumbar region paravertebrally, again the patient does have full range of  motion of her back on evaluation.  Moving all extremities.  SKIN:  No visualized rashes.  NEUROLOGIC:  Alert, oriented.  Sensation intact, motor strength equal and  symmetric.      CONTINUATION BY Tula Schryver, MD:     COURSE IN THE EMERGENCY DEPARTMENT:   The patient was signed out to me at change of shift.  I was asked to check on  the patient after she was symptomatically treated.  Briefly, this is a  28 year old female with 2 visits in one day, presents to the ER today with  significant nausea with vomiting.  She has  a history of gastroparesis.  She is  diabetic.  She tells me that she vomited all day.  Today, they have checked  labs.  Her white count is minimally elevated compared to prior at 22.5  thousand.  Her belly exam is really very unremarkable though.  Her chemistry  and LFTs revealed an elevated glucose at 294 without significant anion gap.  LFTs were within normal limits.  Lipase unremarkable.      The patient was given IV fluids.  She, unfortunately, remained very  tachycardic in the Emergency Department with a rate in the 110s.  She  continued to complain of nausea with vomiting after attempting a p.o.  challenge.  Therefore, [facility]  and [facility] to their service.      ADMITTING DIAGNOSES:   1.  Intractable vomiting.  2.  Thoracic back pain.   3.  History of gastroparesis.   4.  Dehydration.     The patient was admitted in stable condition.      ___________________  Candace CruiseEmily A Dekendrick Uzelac MD  Dictated By: Kristeen MansBrittany Irwin, PA    My signature above authenticates this document and my orders, the final  diagnosis (es), discharge prescription (s), and instructions in the [facility]  Pulsecheck record.  Nursing notes have been reviewed by the physician/mid-level provider.    If you have any questions please contact [phone].    [facility]  D:08/02/2013 02:56:49  T: 08/02/2013 12:08:30  [id number]  Electronically Authenticated and Edited by:  Lyman SpellerEmily A. Luciano CutterHahn, M.D. On 08/10/2013 03:59 PM EDT

## 2013-08-03 NOTE — Discharge Summary (Signed)
[facility]  Discharge Summary   NAME:  Lamar SprinklesMOORE, [name]  SEX:   F  ADMIT: 08/02/2013  DISCH:   DOB:   [birth year]  MR#    [id number]  ACCT#  [id number]    cc: Jerral BonitoWei Zhao MD    DISCHARGE MEDICATIONS:  1.  Zofran 8 mg p.o. b.i.d. p.r.n. for nausea and vomiting.  2.  Phenergan 25 mg p.o. q.6 hours p.r.n. nausea or vomiting.  3.  Promethazine 25 mg p.o. q.6 hours p.r.n. nausea and vomiting.  4.  Lisinopril 20 mg p.o. daily.  5.  Humalog 1 unit subcutaneous a.c. and at bedtime.  6.  Lantus 28 units subcutaneous at bedtime.  7.  Reglan 10 mg p.o. a.c. and at bedtime.  8.  Flexeril 5 mg p.o. t.i.d. p.r.n. muscle spasm.    DISCHARGE DIAGNOSES:  1.  Intractable nausea and vomiting secondary to gastroparesis, resolved.  2.  Back pain secondary to intractable nausea and vomiting causing muscle  strain, improving.  3.  Diabetes mellitus type 1, uncontrolled with multiple diabetes ketoacidosis  episodes.  4.  Gastroparesis, in exacerbation.  5.  Hypertension, controlled.  6.  Hyponatremia secondary to nausea and vomiting, resolved.  7.  Leukocytosis secondary to nausea and vomiting, improving.    HOSPITAL COURSE:  During this hospitalization, the patient received IV fluid as well as a muscle  relaxer for the back pain.  She also received heating pack.  Her symptoms have  improved.    PHYSICAL EXAMINATION ON DAY OF DISCHARGE:  VITAL SIGNS:  Stable.  GENERAL:  No acute distress, alert and oriented times 3.  HEAD:  Normocephalic, atraumatic.  NOSE AND SINUSES:  No tenderness, no discharge.  MOUTH AND THROAT:  No erythema, no edema, no tonsil enlargement.  NECK:  Trachea midline.  Thyroid within normal limits.  No JVD, no carotid  bruits.  LUNGS:  Clear to auscultation bilaterally without wheezing, rales or rhonchi.  CARDIOVASCULAR:  Regular rate and rhythm, no murmurs, gallops or clicks.  ABDOMEN:  Positive bowel sounds, soft, nontender, nondistended.  EXTREMITIES:  No edema, no calf tenderness.   VASCULAR:  Dorsalis pedis, posterior popliteal symmetric bilaterally.  NEUROLOGICAL:  Cranial nerves II-XII grossly intact.  Deep tendon reflexes 2/4  symmetric bilaterally.  PSYCHIATRIC:  No mood swing.  No anxiety or depression.    DISCHARGE CONDITION:  Stable.    DISCHARGE ACTIVITY:  As tolerated.    DISCHARGE FOLLOWUP:  Follow up with primary care doctor within 1 week.      Total discharge time including doing med recon, prescriptions as well as  talking with the patient and physical exam, greater than 45 minutes.      ___________________  [name] [name]  Dictated By: .   JMB  D:08/03/2013 08:12:58  T: 08/03/2013 10:37:04  [id number]  Electronically Authenticated by:  [name] HallmarkLISA Hoa Deriso,[name] On 08/03/2013 05:02 PM EDT

## 2013-08-05 NOTE — ED Provider Notes (Signed)
[facility]  EMERGENCY [facility] TREATMENT REPORT  NAME:  [name] [name], [name]  SEX:   F  ADMIT: 08/05/2013  DOB:   06/28/1985  MR#    [id number]  ROOM:    TIME DICTATED: 09 00 AM  ACCT#  [id number]    cc: Jerral BonitoWei Zhao MD    PRIMARY CARE PHYSICIAN:  Dr. Dion BodyZhao     DATE AND TIME OF EVALUATION:  Friday 08/05/2013.    CHIEF COMPLAINT:  Abdominal pain, nausea, vomiting.    HISTORY OF PRESENT ILLNESS:  This is a 28 year old female with history of gastroparesis and diabetes  presents with 10 out of 10 left upper quadrant pain that started suddenly this  morning. When she woke up, she has had numerous episodes of vomiting today.  She said she was seen here 4 days ago and was admitted to the hospital for the  abdominal pain and the intractable vomiting and then she was here yesterday  for palpitations and left-sided chest pain.  She says the chest pain and  palpitations are still there.  She was evaluated yesterday for and she says a  blood clot and everything was found to be normal and she was sent home. Our  records indicate that when she was admitted here 08/02/2013 she was admitted  for diabetes, DKA, gastroparesis and she was sent home with Zofran, Phenergan,  promethazine, lisinopril, Humalog, Lantus, Reglan, and Flexeril to try to  control her symptoms. At this time,  the patient denies any fever, chills, no  shortness of breath, no diarrhea.  Last bowel movement was yesterday and it  was normal for her and brown.  She also has no urinary symptoms.  She does use  Implanon for birth control.  She has never had a blood clot in the past.    REVIEW OF SYSTEMS:  CONSTITUTIONAL:  No fever, chills or weight loss.  EYES:  No visual symptoms.  ENT:  No sore throat, runny nose, or other URI symptoms.  HEMATOLOGIC/LYMPHATICS:  No excessive bruising or lymph node swelling.  RESPIRATORY: No cough, shortness of breath, or wheezing.   CARDIOVASCULAR:  Chest pressure and palpitations.  No other symptoms.   GASTROINTESTINAL:   Nausea, vomiting, abdominal pain, no diarrhea or  constipation.  GENITOURINARY: No dysuria, frequency, or urgency.  MUSCULOSKELETAL:  No joint pain or swelling.   INTEGUMENTARY:  No rashes.   NEUROLOGICAL:  No headaches, sensory or motor symptoms.    PAST MEDICAL HISTORY:  Diabetes, hypertension, gastroparesis.    SURGICAL HISTORY:  C-section.    SOCIAL HISTORY:  The patient does not smoke or abuse drugs or alcohol.    MEDICATIONS:  Reviewed in Ibex.    ALLERGIES:   REVIEWED IN IBEX.     PHYSICAL EXAMINATION:  GENERAL APPEARANCE:  Thin female lying on exam table in no acute distress.  VITAL SIGNS:  Blood pressure is 173/111, pulse is 116, respirations 18,  temperature 98.6, O2 sat 100% on room air.  HEENT:  Eyes:  Conjunctivae clear, lids normal. Pupils equal, symmetrical and  normally reactive. ENT:  Shows buccal mucosa somewhat dry without lesions.  NECK:  Supple, nontender to palpation.  RESPIRATORY: Clear and equal breath sounds.  No respiratory distress,  tachypnea, or accessory muscle use.  CARDIOVASCULAR:  Heart regular without murmurs, gallops, rubs or thrills.  GASTROINTESTINAL:  Abdomen is soft to tender to palpation in the left upper  quadrant.  SKIN: Warm and dry without rashes.  MUSCULOSKELETAL: Nails:  No clubbing or deformities.  Nailbeds pink  with  prompt capillary refill.   NEUROLOGIC: Alert, oriented. Sensation intact, motor strength equal and  symmetric.      CONTINUATION BY SARAH GREGORY, PA-C    ASSESSMENT AND MANAGEMENT PLAN:  This 28 year old female returns to ED with left upper quadrant abdominal pain  and intractable vomiting.  She was admitted here on 07/07 for vomiting and  DKA.  She was sent home with oral Zofran, Phenergan, promethazine, Reglan and  her insulin medications for diabetes.  She says she cannot keep any of her  medications down.  We are going to do a CBC, CMP and a lipase, urine.  We will   give fluids for hydration.  Chest x-ray, urine pregnancy.  The patient does  state that she is still having some left-sided chest pain that she was  evaluated here for yesterday.  She had a negative D-dimer yesterday, so we  will not repeat that.    DIAGNOSTIC TEST RESULTS:  Urine showed no leukocytes, nitrites or blood.  There were ketones and  glucose.  Urine pregnancy was negative.  White blood cell count slightly  elevated at 11.2, hemoglobin and hematocrit was slightly low at 11.4 and 33.9.  Chest x-ray was normal.  Lipase was 85.  CMP showed sodium 137, potassium 3.5,  CO2 was 20, glucose 199, BUN and creatinine were 11 and 0.9.  AST, ALT and  alkaline phosphatase were 16, 13 and 98 respectively.  Total bilirubin was  0.5.      COURSE IN EMERGENCY DEPARTMENT:  The patient was given IV fluids, Benadryl and Reglan for her symptoms.  She  seemed to be doing well.  We ordered another liter of fluids for her.  She had  some resurgence of symptoms, so we gave her another 25 mg of IV Benadryl and  some rectal Phenergan.  She has never had rectal Phenergan at home.      The patient remained well appearing, seemed to be improved after all of the  medications.  We did discuss diagnostic test findings with her.  At this time  she is not in DKA and her labs look good.  We are going to send her home with  rectal Phenergan since she does not have any of that at home and have her  follow up with her primary.      DIAGNOSIS:  1.  Nausea and vomiting.    2.  Abdominal pain, left upper quadrant.    3.  History of gastroparesis.    DISPOSITION:  The patient dispositioned home in stable condition with prescription for  Phenergan.  Follow up with primary and return to the ED For any new or  worsening symptoms.    The patient was personally evaluated by myself and Dr. Jerolyn Center who agrees with  the above assessment and plan.      ___________________  Carin Hock MD  Dictated By: Thea Silversmith, PA-C     My signature above authenticates this document and my orders, the final  diagnosis (es), discharge prescription (s), and instructions in the [facility]  Pulsecheck record.  Nursing notes have been reviewed by the physician/mid-level provider.    If you have any questions please contact [phone].    [facility]  D:08/05/2013 09:00:12  T: 08/05/2013 21:06:52  [id number]  Electronically Authenticated by:  Nehemiah Massed. Jerolyn Center, M.D. On 08/19/2013 04:01 PM EDT

## 2013-08-05 NOTE — ED Provider Notes (Signed)
[facility]  EMERGENCY [facility] TREATMENT REPORT  NAME:  [name] [name]  SEX:   F  ADMIT: 08/04/2013  DOB:   [birth year]  MR#    [id number]  ROOM:  [id number]  TIME DICTATED: 04 02 PM  ACCT#  [id number]    cc: Jerral BonitoWei Zhao MD    I hereby certify this patient for admission based upon medical necessity as  noted below:     PRIMARY CARE PHYSICIAN:  Dr. Dion BodyZhao    CHIEF COMPLAINT:  The patient reports lightheadedness, palpitations and left-sided chest pain.    HISTORY OF PRESENT ILLNESS:  The patient is a 28 year old female.  She reports she was just discontinued  from the hospital yesterday for nausea and vomiting.  She reports last night  she developed this chest pain on her left side.  She reports that this is  constant, achy pain.  She believes that she is also having some palpitations.  She states that she has not had any further vomiting or diarrhea.  She denies  any cough.  She denies any fevers.  The patient is diabetic and she is unable  to tell the last time she checked her sugar.  She also reports that she feels  lightheaded.      REVIEW OF SYSTEMS:   CONSTITUTIONAL:  No fever, chills or weight loss.    EYES:  No visual symptoms.   ENT:  No sore throat, runny nose or other URI symptoms.   RESPIRATORY:  No shortness of breath, cough or wheezing.   CARDIOVASCULAR:  The patient reports left-sided chest pain, palpitations.  GASTROINTESTINAL:  No vomiting, diarrhea or abdominal pain.   MUSCULOSKELETAL:  No joint pain or swelling.   INTEGUMENTARY:  No rashes.   NEUROLOGICAL:  No headaches, sensory or motor symptoms.     PAST MEDICAL HISTORY:  Diabetes, hypertension and gastroparesis.      PAST SURGICAL HISTORY:  The patient has a surgical history of a C-section.    SOCIAL HISTORY:  The patient denies smoking, alcohol or drug use.    ALLERGIES:  PENICILLIN AND ZITHROMAX.    CURRENT MEDICATIONS:  Humalog, Lantus, lisinopril, Reglan and Zofran.    PHYSICAL EXAMINATION:   VITAL SIGNS:  Blood pressure is 143/84, pulse 102, respirations 18,  temperature is 98.1, O2 sat is 100% on room air, pain 8 out of 10.  GENERAL APPEARANCE:  Patient appears well developed and well nourished.  Appearance and behavior are age and situation appropriate.   HEENT:  Eyes:  Conjunctivae clear, lids normal.  Pupils equal, symmetrical and  normally reactive.  Ears/Nose:  Hearing is grossly intact to voice.  External  examinations of the ears and nose are unremarkable.   RESPIRATORY:  Clear and equal breath sounds.  No respiratory distress,  tachypnea or accessory muscle use.   CARDIOVASCULAR:   Heart regular, without murmurs, gallops, rubs or thrills.  No peripheral edema or significant varicosities.  Calves are soft and   nontender.  CHEST:  Symmetrical without masses.  The patient does have reproducible  tenderness when pressing on the left lower ribs underneath the breast area.    GASTROINTESTINAL:  The patient has some tenderness noted on the left upper  quadrant.  There is no rebound.  There is no guarding.  There is no hepato- or  splenomegaly noted.  There are no abdominal masses appreciated by inspection  or palpation.  SKIN:  Warm and dry without rashes.   NEUROLOGIC:  Alert, oriented.  Sensation  intact, motor strength equal and  symmetric.  There is no facial asymmetry or dysarthria.     CONTINUATION BY Jannetta QuintOURTNEY Taichi Repka, MD    INITIAL ASSESSMENT AND MANAGEMENT PLAN:  This 28 year old female just discharged yesterday from the hospital with  gastroparesis episode.  She states that she developed some worsening anterior  wall chest pain and palpitations with heart rates up to the 140s and so she  came in for evaluation in the Emergency Department.  Her EKG shows sinus  tachycardia.  Will obtain basic labs and evaluate for any signs of DKA,  dehydration.  She does have some mild tenderness to the left upper quadrant.  Pancreas enzymes and liver function tests will be evaluated.  She has clearly   reproducible anterior chest wall discomfort.  She has no significant shortness  of breath and no really pleuritic symptoms; however, she has had multiple  recent hospitalizations in the past month.  Therefore, we will order a   D-dimer.    DIAGNOSTIC INTERPRETATIONS:  Point of care urine:  500 glucose, small bilirubin greater than 160 ketones,  no nitrates, negative leukoesterase.  Urine pregnancy test negative.  Chest  x-ray is normal.  White count 16.9, hemoglobin 12.8, hematocrit 38.8, platelet  count 279.  D-dimer 0.39, lipase 54, LFTs:  Alkaline phosphatase 123, AST 13,  ALT 14, TBILI 0.9, glucose 305, Bicarb 19, chloride 97, anion gap was  calculated out at 15.  Troponins less than 0.015.    EMERGENCY DEPARTMENT COURSE:  The patient was attached to blood pressure, cardiac, pulse and oximetry  monitoring,  She was given IV fluids for hydration.  She was given 2 L.  She  was given Pepcid IV.  She was then started on standard insulin protocol to  manage her mild metabolic acidosis in order to keep her from into TKA.  I  discussed the plan with Dr. Eliezer ChampagneAbolhassani.  The patient will be brought in.  She  does have diabetes, hypertension, hyperlipidemia and granted she is young at  [age], I have refrained from aspirin at this point because it is mostly  reproducible chest wall pain and I am just concerned that she may actually be  developing ulcers.  Likely etiology, but given her cardiac enzymes are  negative, it has been pretty constant since this morning.  We can trend out  cardiac enzymes, continue IV fluid hydration, Pepcid and continue to monitor  her symptoms.    EMERGENCY DEPARTMENT DIAGNOSES:  1.  Left anterior chest pain.  2.  Palpitations.  3.  Hyperglycemia with mild metabolic acidosis.    PLAN:  As above.      ___________________  Lucio Edwardourtney T Mark Hassey MD  Dictated By: Eliot Fordhristine Ward, NP    My signature above authenticates this document and my orders, the final   diagnosis (es), discharge prescription (s), and instructions in the [facility]  Pulsecheck record.  Nursing notes have been reviewed by the physician/mid-level provider.    If you have any questions please contact [phone].    [facility]  D:08/04/2013 16:02:19  T: 08/05/2013 09:47:49  [id number]  Electronically Authenticated by:  Lucio EdwardOURTNEY T Vernesha Talbot, MD On 08/06/2013 03:48 PM EDT

## 2013-08-25 NOTE — ED Provider Notes (Signed)
[facility] TREATMENT REPORT  NAME:  [name] [name], [name]  SEX:   F  ADMIT: 08/25/2013  DOB:   1985/11/08  MR#    [id number]  ROOM:    TIME DICTATED: 02 20 PM  ACCT#  [id number]    cc: Jerral BonitoWei Zhao MD    TIME OF EVALUATION:  1323    PRIMARY CARE PROVIDER:  Dr. Dion BodyZhao     CHIEF COMPLAINT:  Nausea, vomiting, headache, back pain.    HISTORY OF PRESENT ILLNESS:  This is a 28 year old female with history of diabetes. Presenting to the  emergency department today secondary to headache. The patient reports that she  has had an intermittent headache over the past week.  Headache is global, that  seems to be slightly worse on the right side, described as throbbing,  associated with photo and phonophobia.  The patient did not begin vomiting  until 2 hours prior to arrival and does state that she has vomited 3 times.  The patient at the beginning of the week was taking Advil which seemed to  help, but over the past couple of days the headache has been more constant.  She does report some neck pain but denies any fever, denies any abdominal  pain, or diarrhea associated with her vomiting.    REVIEW OF SYSTEMS:  CONSTITUTIONAL:  No fever.  EYES:  Reports photophobia.  ENT:  No runny nose.  RESPIRATORY:  No shortness of breath.  CARDIOVASCULAR:  No chest pain.  GASTROINTESTINAL:  Positive for vomiting, no abdominal pain or diarrhea.  GENITOURINARY:  No dysuria, frequency or urgency.  MUSCULOSKELETAL:  Reports neck pain.  INTEGUMENTARY:  No rashes.  NEUROLOGIC:  Positive for headache.    PAST MEDICAL HISTORY:  History of diabetes, gastroparesis, hypertension, C-section.    SOCIAL HISTORY:  Nonsmoker.    FAMILY HISTORY:  Noncontributory.    MEDICATIONS:  Reviewed.    ALLERGIES:  REVIEWED.    PHYSICAL EXAMINATION:  VITAL SIGNS:  Blood pressure 173/113, pulse 108, respirations 18, temperature  98.6, pain 10 out of 10, O2 saturation 99% on room air.   GENERAL:  The patient well nourished, well developed, answering questions  appropriately, in no distress.  HEENT: Eyes:  Conjunctivae clear, lids normal. Pupils equal, symmetrical and  normally reactive. ENT: Mouth:  Mucous membranes somewhat moist.   RESPIRATORY: Clear and equal breath sounds.  No respiratory distress,  tachypnea, or accessory muscle use.  CARDIOVASCULAR:  Heart regular without murmurs, gallops, rubs or thrills.  GASTROINTESTINAL:  Normoactive bowel sounds.  Abdomen is soft, completely  nontender, no rebound or guarding.  MUSCULOSKELETAL:  The patient has no localized midline tenderness to the  cervical, thoracic, lumbar or sacral bodies.  I am unable to reproduce any  neck pain on evaluation, full range of motion of the neck without any nuchal  rigidity.  Moving all extremities spontaneously.  SKIN:  No visualized rashes.   NEUROLOGIC: Alert, oriented. Sensation intact, motor strength equal and  symmetric. Normal finger-to-nose.    CONTINUATION BY Kristeen MansBRITTANY IRWIN, PA:     INITIAL ASSESSMENT AND MANAGEMENT PLAN:   This 28 year old female complaining of headache and vomiting.  She is  neurologically intact on evaluation.  My initial plan was to treat  symptomatically.      ER COURSE:  The patient medicated for IV Benadryl, IV Reglan, given a liter of IV fluids.  Upon evaluation by Dr. Jerolyn CenterMathews, the patient reported worsening of her  headache.  Subsequently, a  CT scan was ordered.  Patient did have blood work  ordered previously.    RESULTS OF DIAGNOSTIC STUDIES:  CBC:  Unremarkable, normal white count, 8.068% segs, hemoglobin and hematocrit  12.8 and 38.8.  BMP:  Sodium of 135, glucose is 248 with a normal bicarbonate.  CT head revealed enlarged adenoids but was otherwise negative.      ER COURSE:  After initial dose of medications given, the patient with continued headache  and was given IV Ativan and additional fluids.  Based on persistent headache,   the patient was offered a spinal tap while in the department for evaluation of  her pain.  The patient, however, states that about 9 years ago when she had a  headache, she received a spinal tap and she does not wish to have one today.  Patient does look clinically more comfortable.  She was actually sleeping on  my final evaluation.  At this time, she is stable to go home.  Her blood  pressure has improved while here in the department, suspect that was initially  elevated secondary to vomiting.  Blood pressure at time of discharge is  155/93.  Will recommend the patient to follow up with her doctor and return  here for any new or worsening symptoms including persistent vomiting,  worsening pain.  Patient agrees to plan.    CLINICAL IMPRESSION AND DIAGNOSIS:  Cephalgia.    DISPOSITION AND PLAN:  The patient discharged home.  Prescriptions for Fioricet and Zofran.  Instructions to follow up with her doctor and return for any new or worsening  symptoms.  The patient was personally evaluated by myself and Dr. Jerolyn Center, who  agrees with the above assessment and plan.      ___________________  Carin Hock MD  Dictated By: Kristeen Mans, PA    My signature above authenticates this document and my orders, the final  diagnosis (es), discharge prescription (s), and instructions in the [facility]  Pulsecheck record.  Nursing notes have been reviewed by the physician/mid-level provider.    If you have any questions please contact [phone].    [facility]  D:08/25/2013 14:20:38  T: 08/25/2013 17:17:12  [id number]  Electronically Authenticated by:  Nehemiah Massed. Jerolyn Center, M.D. On 09/01/2013 08:54 AM EDT

## 2013-08-27 NOTE — ED Provider Notes (Signed)
[facility]  EMERGENCY DEPARTMENT TREATMENT REPORT  NAME:  [name] [name]  SEX:   F  ADMIT: 08/27/2013  DOB:   06-27-1985  MR#    [id number]  ROOM:    TIME DICTATED: 02 56 AM  ACCT#  [id number]    cc: Jerral Bonito MD    DATE OF SERVICE:  08/27/2013     TIME OF EVALUATION:  0230    PRIMARY CARE DOCTOR:   Jerral Bonito, M.D.     CHIEF COMPLAINT:     Abdominal pain, nausea and vomiting.    HISTORY OF PRESENT ILLNESS:  This is a 28 year old female with a history of gastroparesis,  insulin-dependent diabetic and hypertension who is well-known to the Emergency  Department with numerous visits for diabetic ketoacidosis, hyperglycemia and  nausea and vomiting related to her gastroparesis.      As a matter of fact, the patient was just in here on 08/25/2013 for cephalgia.  She states that she has been throwing up ever since her release from the  Emergency Department that day.  She takes Reglan at home.  It is not working.  She has too numerous to count vomiting today.  She was brought in by   ambulance.    She otherwise denies any fever or chills, cough, chest pain, shortness of  breath, diarrhea.  Abdominal pain is rather diffuse 8 out of 10 throbby type  of  pain.  She denies pregnancy.  There are no alleviating or exacerbating  factors.  She said she last checked her sugar before she came to the emergency  room.  It was 156.     REVIEW OF SYSTEMS:   CONSTITUTIONAL:  No fever, chills, or weight loss.   EYES:   No visual symptoms.   ENT:  No sore throat, runny nose, or other URI symptoms.   ENDOCRINE:  No diabetic symptoms.   HEMATOLOGIC AND LYMPHATIC:   No excessive bruising or lymph node swelling.   ALLERGIC AND IMMUNOLOGIC:  No urticaria or allergy symptoms.   RESPIRATORY:  No cough, shortness of breath, or wheezing.   CARDIOVASCULAR:  No chest pain, chest pressure, or palpitations.   GASTROINTESTINAL:   Positive for abdominal pain and vomiting.  GENITOURINARY:  No dysuria, frequency, or urgency.    MUSCULOSKELETAL:  No joint pain or swelling.   INTEGUMENTARY:  No rashes.   NEUROLOGICAL:  No headaches, sensory or motor symptoms.    PAST MEDICAL HISTORY:   Gastroparesis, hypertension, diabetes.    PAST SURGICAL HISTORY:     No surgical history that is pertinent.    PAST PSYCHIATRIC HISTORY:   None.    FAMILY HISTORY:    Noncontributory to this case.    SOCIAL HISTORY:   The patient smokes cigarettes, drinks socially, does not illicit drugs.    ALLERGIES:   Reviewed in Ibex.    MEDICATIONS:    Reviewed in Ibex.    PHYSICAL EXAMINATION:  VITAL SIGNS:  Blood pressure 184/90, pulse 109, respirations 22, temperature  98.4, saturating 100% on room air.  CONSTITUTIONAL:  This is a thin 28 year old female lying on her left side.  She has had some active vomiting in the room.  Appears to be in some  discomfort.  She is otherwise cooperative with the evaluation.  EYES:  Conjunctivae clear, lids normal.  Pupils equal, symmetrical, and  normally reactive.   ENT:  Mucous membranes appear to be a bit dry.    RESPIRATORY:  Clear and equal breath  sounds.  No respiratory distress,  tachypnea, or accessory muscle use.  CARDIOVASCULAR:   Heart regular, without murmurs, gallops, rubs, or thrills.  VASCULAR:  DP pulses 2+ and equal bilaterally.  No peripheral edema or  significant varicosities.   ABDOMEN:  Diffusely tender to palpation although it is soft.  There is no  organomegaly, mass, ecchymosis or hematoma.  No rebound tenderness.  No  guarding.  No Murphy sign.  No psoas.  No obturator sign.    NAILS:  No clubbing or deformities.  Nailbeds pink with prompt capillary  refill.   SKIN:  Warm and dry without rashes.   NEUROLOGIC:  The patient is alert and oriented times 3.  She is able to move  all extremities upper and lower bilaterally without difficulty.  CONTINUATION BY Imogene Burn, MD:    DIAGNOSTIC INTERPRETATION:  Lipase, CMP, CBC are all essentially normal, except for elevated glucose at   268 and BUN of 28 with a mildly low bicarb of 20.      COURSE IN THE EMERGENCY DEPARTMENT:  The patient received a liter of saline IV, as well as 25 mg Phenergan, 2.5 mg  Haldol and 50 mg Benadryl.  She is feeling better at this time, no further  vomiting.  Is discharged on Zofran and will follow up with her regular doctor  for further treatment of her gastroparesis.    DIAGNOSES:  1.  Epigastric abdominal pain.  2.  Gastroparesis.  3.  Nausea and vomiting.  4.  Type 1 insulin-dependent diabetes.  4.  Dehydration.    The patient is discharged home in stable condition, with instructions to  follow up with their regular doctor.  They are advised to return immediately  for any worsening or symptoms of concern.      ___________________  Imogene Burn M.D.  Dictated By: Fransisca Kaufmann, PA-C    My signature above authenticates this document and my orders, the final  diagnosis (es), discharge prescription (s), and instructions in the [facility]  Pulsecheck record.  Nursing notes have been reviewed by the physician/mid-level provider.    If you have any questions please contact [phone].    LB  D:08/27/2013 02:56:30  T: 08/27/2013 16:17:36  [id number]  Electronically Authenticated by:  Ferdinand Lango. Markayla Reichart, M.D. On 08/29/2013 01:07 PM EDT

## 2013-08-28 NOTE — ED Provider Notes (Signed)
[facility]  EMERGENCY DEPARTMENT TREATMENT REPORT  NAME:  [name] AubreyMOORE, [place]  SEX:   F  ADMIT: 08/28/2013  DOB:   08-19-1985  MR#    [id number]  ROOM:  ER03  TIME DICTATED: 01 56 AM  ACCT#  [id number]        I hereby certify this patient for admission based upon medical necessity as  noted below:    CHIEF COMPLAINT:  Upper abdominal pain and vomiting.    HISTORY OF PRESENT ILLNESS:  The patient is a 28 year old female who was seen in our facility yesterday.  The patient had complained of  upper abdominal pain, which was associated with  nausea and multiple episodes of vomiting.  The patient was doing somewhat  better at the time she was discharged; however, has been symptomatic over the  course of the last 24 hours.  She also states that her blood sugars have been  higher than her usual and for that reason presents for evaluation and care.    PAST MEDICAL HISTORY:  Remarkable for diabetes mellitus.  The patient also has a history of  gastroparesis as mentioned.    SOCIAL HISTORY:  Negative for smoking.    FAMILY HISTORY:  Noncontributory to this presentation.    REVIEW OF SYSTEMS:  CONSTITUTIONAL:  No fever, chills, weight loss.  ENT:  No sore throat, runny nose, or other URI symptoms.  RESPIRATORY:  No cough, shortness of breath, or wheezing.  palpitations,  pressure, or palpitations.  CARDIOVASCULAR:  No chest pain, chest pressure, or palpitations.   GENITOURINARY:  No dysuria, frequency, or urgency.   INTEGUMENTARY:  No rashes.   Denies complaints in all other systems.     ALLERGIES:  PENICILLIN, ZITHROMAX.    MEDICATIONS:  Reviewed in Ibex  including insulin.    PHYSICAL EXAMINATION:  GENERAL APPEARANCE:  The patient appeared uncomfortable secondary to nausea  and the abdominal discomfort.  VITAL SIGNS:  Blood pressure 170/84, pulse of 118, respiratory rate 20,  temperature 98.1, pulse ox is 99% measured on room air.   NECK:  Supple, nontender, symmetrical, no masses or JVD, trachea midline,   thyroid not enlarged, nodular, or tender.   RESPIRATORY:  Clear and equal breath sounds.  No respiratory distress,  tachypnea, or accessory muscle use.   Chest wall without appreciable  tenderness noted.  CARDIOVASCULAR:  Tachycardic without audible murmur.  ABDOMEN:  Bowel sounds are present.  The patient had tenderness noted  primarily in the epigastric region and also the left upper quadrant.  No  specific rebound or guarding was noted.  EXTREMITIES:  Normal examination without edema or deformity.   SKIN:  Warm and dry without rashes.   NEUROLOGIC:  Alert, oriented.  Sensation intact, motor strength equal and  symmetric.      IMPRESSION AND MANAGEMENT PLAN:  The patient presents with upper abdominal discomfort which has been associated  with nausea and multiple episodes of vomiting.  The patient also she is  significantly tachycardic, possibly related to volume depletion and  exacerbated by her current symptoms.  Appropriate diagnostic studies will be  performed.  The patient will be medicated for her symptoms and hydrated with  IV normal saline.    The patient's disposition will be determined based on the results of his/her  diagnostic studies and his/her continued clinical progress.      CONTINUATION BY Cambreigh Dearing, MD      DIAGNOSTIC INTERPRETATIONS:   The patient's hemoglobin and hematocrit returned at 12.7/38.  White count was  elevated at 15,300.  CMP was abnormal for a glucose of 383, BUN 32, and serum  creatinine of 1.4.  The patient's CO2 returned at 18.  Urinalysis returned  positive for ketones and glucose.      COURSE IN THE EMERGENCY DEPARTMENT:    IV access was obtained.  The patient was medicated for her symptoms with IV  Reglan and Benadryl.  With her acidosis and hyperglycemia, she was started on  an IV insulin drip.  The patient's discomfort continued in the left upper  quadrant and the patient states that this is exactly similar to the discomfort   she has experienced with her previous bouts of gastroparesis.  With her  continued symptoms and her failed outpatient management, the patient was  thought to be an appropriate candidate for admission.  This was discussed with  Dr. Earlie LouJasarevic, who concurred.    FINAL DIAGNOSES:   1.  Abdominal pain secondary to gastroparesis.  2.  Hyperglycemia with acidosis.  3.  Renal insufficiency, likely prerenal azotemia.     DISPOSITION:  The patient was admitted to the care of Dr. Earlie LouJasarevic as outlined above.      ___________________  Elsie Saasavid A Betzayda Braxton MD  Dictated By: Marland Kitchen.     My signature above authenticates this document and my orders, the final  diagnosis (es), discharge prescription (s), and instructions in the [facility]  Pulsecheck record.  Nursing notes have been reviewed by the physician/mid-level provider.    If you have any questions please contact [phone].    DS  D:08/28/2013 01:56:45  T: 08/28/2013 16:60:6302:21:52  [id number]  Electronically Authenticated by:  Cliffton Astersavid A. Yoceline Bazar, M.D. On 08/28/2013 09:07 PM EDT

## 2013-08-28 NOTE — H&P (Signed)
[facility]  History [facility] Physical  NAME:  [name] [name], [name]  SEX:   F  ADMIT: 08/28/2013  DOB:May 04, 1985  MR#    [id number]  ROOM:  [id number]  ACCT#  [id number]    I hereby certify this patient for admission based upon medical necessity as  noted below:    <    DATE OF ADMISSION:   08/28/2013     DATE OF SERVICE:  08/28/2013    CHIEF COMPLAINT:  Nausea, vomiting, abdominal pain.    HISTORY OF PRESENT ILLNESS:  This is a 28 year old female with past medical history significant for  diabetes mellitus complicated by gastroparesis, hypertension, who presents  with a 2-day duration of nausea and vomiting, unable to keep food down.  The  patient is also complaining of some abdominal pain, mainly left upper  quadrant, dull, intermittent.  He denies any dysuria, increased frequency.  No  rashes, no wounds.  Denies a cough.  Denies any diarrhea.  In the emergency  room, noted to be severely dehydrated, to also have very high glucose level,  started on insulin drip, IV fluids started as well.  No source of infection  otherwise, identified.  Urinalysis not consistent with urinary tract  infection.  The patient otherwise is being admitted for further management of  intractable nausea and vomiting, dehydration, severe hyperglycemia, acute  renal insufficiency.    REVIEW OF SYSTEMS:  A 12-point review of systems is negative except for what is in HPI.    PAST MEDICAL HISTORY:  Significant for diabetes mellitus, insulin-dependent, complicated by  gastroparesis, hypertension.    SOCIAL HISTORY:  Denies any tobacco or alcohol use, no illicit drug use.    FAMILY HISTORY:  Reviewed and noncontributory to the case.    ALLERGIES:  INCLUDE ZITHROMAX AND PENICILLIN; REACTION FOR BOTH IS HIVES.    CURRENT MEDICATIONS:  Includes Fioricet, Humalog, Lantus 28 units subcutaneously once a day at  bedtime, lisinopril 40 mg orally once a day, Reglan 10 mg orally as needed  every 6 hours.    PHYSICAL EXAMINATION:   VITAL SIGNS:  Blood pressure 170/84, pulse 118, respiratory rate 20,  temperature is 98.1.  Pain scale is 8 out of 10.  Oxygen saturation is 99% on  room air.  GENERAL APPEARANCE:  In no acute distress, resting comfortably, appears stated  age.  HEENT:  Normocephalic, atraumatic.  Extraocular movements are intact.  Pupils  are equal, round, and reactive to light and accommodation.  Dry mucous  membranes.  NECK:  Supple, no JVD, no lymphadenopathy, no thyromegaly.  LUNGS:  Clear to auscultation bilaterally, good air entry, no wheezing.  CARDIOVASCULAR:  Tachycardic, no murmurs, rubs or gallops.  ABDOMEN:  Positive bowel sounds, soft, mildly tender to moderate palpation of  the epigastric area and left upper quadrant including left lower quadrant,  otherwise no guarding or rebound tenderness, nondistended.  EXTREMITIES:  No clubbing, cyanosis or edema.  Dorsalis pedis pulses 2+  bilaterally.  SKIN:  No rashes or wounds.  NEUROLOGIC:  Cranial nerves II through XII grossly intact, nonfocal, alert and  oriented times 3.    LABORATORY DATA:  Significant for sodium of 133, hemoglobin 12.7, glucose of 383, BUN 32,  creatinine 1.4.  White blood cell count is 15.3.    ASSESSMENT AND PLAN:  1.  Intractable nausea and vomiting.  Zofran, IV fluids.  2.  Dehydration.  Continue IV fluids, treat nausea, vomiting.  3.  Severe hyperglycemia.  Continue insulin drip at this  time, IV fluids,  transition to subcutaneous insulin.  4.  Gastroparesis, treatment as outlined above.  5.  Hyponatremia, mild hypovolemia.  Continue IV fluids, repeat sodium in  a.m., relatively mild.  6.  Acute renal insufficiency, mild.  Avoid nephrotoxic medications and adjust  medications to creatinine clearance.  Continue hydration, prerenal, repeat  creatinine in a.m.  7.  Hypertension.  This is uncontrolled.  Blood pressure elevated at this  time.  Will add p.r.n.  Hold lisinopril in the setting of acute renal  insufficiency.    8.  Diet is diabetic.     9.  Deep vein thrombosis prophylaxis:  The patient is ambulatory.    DISPOSITION:  Home in 2 to 4 days, depending on response to treatment.    CODE STATUS:  FULL CODE.     The case was discussed with the ER attending and the patient.      ___________________  Irving Burton MD  Dictated By: .   [place]  D:08/28/2013 05:29:55  T: 08/28/2013 16:10:96  [id number]  Electronically Authenticated and Edited by:  Irving Burton, MD On 08/30/2013 07:19 PM EDT

## 2013-08-29 NOTE — Discharge Summary (Signed)
[facility]  Discharge Summary   NAME:  [name], [name]  SEX:   F  ADMIT: 08/28/2013  DISCH: 08/29/2013  DOB:   May 28, 1985  MR#    [id number]  ACCT#  [id number]    cc: . Marland Kitchen     DATE OF ADMISSION:  08/28/2013    DATE OF DISCHARGE:  08/29/2013    FINAL DIAGNOSES:  1.  Intractable nausea and vomiting, most likely due to severe gastroparesis  secondary to uncontrolled diabetes.  2.  Diabetes mellitus type 1, uncontrolled with diabetic neuropathy and  gastroparesis.  3.  Acute renal insufficiency.  4.  Malnutrition, mild.  5.  Hypertension.  6.  Medical noncompliance.  7.  Hyponatremia, mild.  8.  Hypophosphatemia.  9.  Vitamin D deficiency.      HOSPITAL COURSE:  The patient is a 28 year old female with a history of type 1 diabetes who  presented with a chief complaint of abdominal pain associated with nausea and  vomiting.  The patient was admitted to a regular floor for observation.  She  was kept n.p.o.  The patient was started on IV fluids.  The patient was  initially started on insulin drip.  The next day, the patient was feeling a  little better.  She was started on clear liquid diet which was advanced to a  diabetic diet which she tolerated well.  For gastroparesis the patient was  started on Reglan which helped.  On the day of discharge, the patient was  feeling much better.  She tolerated her breakfast.  She denied any chest pain.  She denied any vomiting and requested to go home.    PHYSICAL EXAMINATION:  GENERAL:  On the day of discharge showed a young female who was sitting  comfortably.  VITAL SIGNS:  Temperature 97.7, pulse 81, respirations 19, blood pressure  138/86.  RESPIRATORY:  Lungs were clear.  HEART:  Normal S1 and S2, regular rate and rhythm.  ABDOMEN:  Soft, nontender, bowel sounds audible.      The patient is being discharged home with followup appointment with her  primary care provider in 1 week.    CONDITION ON DISCHARGE:  Stable.    ACTIVITY:  As tolerated.    DIET:  Diabetic diet.     The patient was advised to check her blood glucose fasting in 1 hour after  each meal and take that record to her primary care provider and her  endocrinologist.  The patient will continue Zofran as needed for nausea and  vomiting, lisinopril 40 once a day.  The patient's Lantus insulin was changed  to 15 units twice a day, Humalog 5 units before breakfast, 7 units before  lunch and 10 units before supper.  The patient was strongly encouraged to  check her blood glucose 1 hour after each meal and keep a record.  The patient  was advised to take her Reglan before meals and bedtime.  The patient's  Flexeril was discontinued which can make her gastroparesis worse.  The patient  was given prescription for Neutra-Phos 1 packet 3 times a day for 3 days,  vitamin D 50,000 units once a week.  The patient was told if her condition  gets worse, she starts having severe abdominal pain, gets sick to her stomach,  she can call primary care provider or come back to the emergency room.      ___________________  Hazel Sams MD  Dictated By: .   Davonna Belling  D:08/29/2013 09:20:21  T: 08/29/2013 10:16:55  [id number]  Electronically Authenticated by:  Hazel SamsAHIR Epimenio Schetter, MD On 08/30/2013 07:18 AM EDT

## 2013-11-01 LAB — CBC WITH AUTOMATED DIFF
BASOPHILS: 0.2 % (ref 0–3)
EOSINOPHILS: 0 % (ref 0–5)
HCT: 39.1 % (ref 37.0–50.0)
HGB: 13.1 gm/dl (ref 13.0–17.2)
IMMATURE GRANULOCYTES: 0.4 % (ref 0.0–3.0)
LYMPHOCYTES: 8.2 % — ABNORMAL LOW (ref 28–48)
MCH: 29.4 pg (ref 25.4–34.6)
MCHC: 33.5 gm/dl (ref 30.0–36.0)
MCV: 87.7 fL (ref 80.0–98.0)
MONOCYTES: 0.9 % — ABNORMAL LOW (ref 1–13)
MPV: 12 fL — ABNORMAL HIGH (ref 6.0–10.0)
NEUTROPHILS: 90.3 % — ABNORMAL HIGH (ref 34–64)
NRBC: 0 (ref 0–0)
PLATELET: 273 10*3/uL (ref 140–450)
RBC: 4.46 M/uL (ref 3.60–5.20)
RDW-SD: 42.3 (ref 36.4–46.3)
WBC: 15.5 10*3/uL — ABNORMAL HIGH (ref 4.0–11.0)

## 2013-11-01 LAB — METABOLIC PANEL, COMPREHENSIVE
ALT (SGPT): 18 U/L (ref 12–78)
Albumin: 4.3 gm/dl (ref 3.4–5.0)
Alk. phosphatase: 126 U/L — ABNORMAL HIGH (ref 45–117)
BUN: 22 mg/dl (ref 7–25)
Bilirubin, total: 0.9 mg/dl (ref 0.2–1.0)
CO2: 19 mEq/L — ABNORMAL LOW (ref 21–32)
Calcium: 9.8 mg/dl (ref 8.5–10.1)
Chloride: 101 mEq/L (ref 98–107)
Creatinine: 1.3 mg/dl (ref 0.6–1.3)
GFR est AA: >60
GFR est non-AA: 52
Glucose: 398 mg/dl — ABNORMAL HIGH (ref 74–106)
Protein, total: 9.2 gm/dl — ABNORMAL HIGH (ref 6.4–8.2)
Sodium: 132 mEq/L — ABNORMAL LOW (ref 136–145)

## 2013-11-01 LAB — GLUCOSE, POC
Glucose (POC): 376 mg/dL — ABNORMAL HIGH (ref 65–105)
Glucose (POC): 279 mg/dL — ABNORMAL HIGH (ref 65–105)
Glucose (POC): 300 mg/dL — ABNORMAL HIGH (ref 65–105)
Glucose (POC): 155 mg/dL — ABNORMAL HIGH (ref 65–105)
Glucose (POC): 176 mg/dL — ABNORMAL HIGH (ref 65–105)
Glucose (POC): 198 mg/dL — ABNORMAL HIGH (ref 65–105)
Glucose (POC): 147 mg/dL — ABNORMAL HIGH (ref 65–105)

## 2013-11-01 LAB — POC BLOOD GAS
BASE EXCESS: -9 mmol/L — ABNORMAL LOW (ref <=3)
BICARBONATE: 18.1 mmol/L — ABNORMAL LOW (ref 22.0–26.0)
CO2, TOTAL: 19 mmol/L — ABNORMAL LOW (ref 21–32)
O2 SAT: 77 % — ABNORMAL HIGH (ref 70–75)
PCO2: 39.5 mm Hg — ABNORMAL LOW (ref 41.0–51.0)
PO2: 48 mm Hg — ABNORMAL HIGH (ref 35–40)
pH: 7.27 — ABNORMAL LOW (ref 7.310–7.410)

## 2013-11-01 LAB — POC URINE MACROSCOPIC
Bilirubin: NEGATIVE
Glucose: 500 mg/dl — AB
Ketone: 80 mg/dl — AB
Leukocyte Esterase: NEGATIVE
Nitrites: POSITIVE — AB
Protein: 100 mg/dl — AB
Specific gravity: 1.025 (ref 1.005–1.030)
Urobilinogen: 0.2 EU/dl (ref 0.0–1.0)
pH (UA): 5.5 (ref 5–9)

## 2013-11-01 LAB — METABOLIC PANEL, BASIC
BUN: 22 mg/dl (ref 7–25)
CO2: 21 mEq/L (ref 21–32)
Calcium: 8.3 mg/dl — ABNORMAL LOW (ref 8.5–10.1)
Chloride: 109 mEq/L — ABNORMAL HIGH (ref 98–107)
Creatinine: 1.2 mg/dl (ref 0.6–1.3)
GFR est AA: >60
GFR est non-AA: 57
Glucose: 167 mg/dl — ABNORMAL HIGH (ref 74–106)
Potassium: 4.2 mEq/L (ref 3.5–5.1)
Sodium: 137 mEq/L (ref 136–145)

## 2013-11-01 LAB — POC URINE MICROSCOPIC

## 2013-11-01 LAB — POC HCG,URINE: HCG urine, QL: NEGATIVE

## 2013-11-01 LAB — LIPASE: Lipase: 45 U/L — ABNORMAL LOW (ref 73–393)

## 2013-11-02 LAB — METABOLIC PANEL, BASIC
BUN: 17 mg/dl (ref 7–25)
CO2: 21 mEq/L (ref 21–32)
Calcium: 8.3 mg/dl — ABNORMAL LOW (ref 8.5–10.1)
Chloride: 105 mEq/L (ref 98–107)
Creatinine: 0.9 mg/dl (ref 0.6–1.3)
GFR est AA: >60
GFR est non-AA: >60
Glucose: 257 mg/dl — ABNORMAL HIGH (ref 74–106)
Potassium: 4 mEq/L (ref 3.5–5.1)
Sodium: 134 mEq/L — ABNORMAL LOW (ref 136–145)

## 2013-11-02 LAB — CBC W/O DIFF
HCT: 32.6 % — ABNORMAL LOW (ref 37.0–50.0)
HGB: 10.8 gm/dl — ABNORMAL LOW (ref 13.0–17.2)
MCH: 29.3 pg (ref 25.4–34.6)
MCHC: 33.1 gm/dl (ref 30.0–36.0)
MCV: 88.3 fL (ref 80.0–98.0)
MPV: 12.3 fL — ABNORMAL HIGH (ref 6.0–10.0)
PLATELET: 206 10*3/uL (ref 140–450)
RBC: 3.69 M/uL (ref 3.60–5.20)
RDW-SD: 42.7 (ref 36.4–46.3)
WBC: 11 10*3/uL (ref 4.0–11.0)

## 2013-11-02 LAB — GLUCOSE, POC
Glucose (POC): 198 mg/dL — ABNORMAL HIGH (ref 65–105)
Glucose (POC): 253 mg/dL — ABNORMAL HIGH (ref 65–105)
Glucose (POC): 175 mg/dL — ABNORMAL HIGH (ref 65–105)
Glucose (POC): 225 mg/dL — ABNORMAL HIGH (ref 65–105)

## 2013-11-02 NOTE — ED Provider Notes (Addendum)
[facility]  EMERGENCY [facility] TREATMENT REPORT  NAME:  Dor[place]MOORE, [place]  SEX:   F  ADMIT: 11/01/2013  DOB:   1985-11-25  MR#    [id number]  ROOM:  5233  TIME DICTATED: 06 25 PM  ACCT#  [id number]        I hereby certify this patient for admission based upon medical necessity as  noted below:    CHIEF COMPLAINT:  Vomiting, nausea, back pain.    HISTORY OF PRESENT ILLNESS:  This is a 28 year old female who is an insulin-dependent diabetic, history of  gastroparesis.  Apparently she states that she has been vomiting all day, has  some mild abdominal discomfort.  She stated that for the last 5 hours the  vomiting has subsided.  She really has no abdominal pain, just nausea, but the  last time she vomited she felt that tear in her back and felt pain, and now  when she takes a deep breath it hurts.  She cannot get comfortable.  So she  decided to go ahead and come in and get evaluated.  She is not short of  breath.  She has no fever, still nauseated.  She states she did not check her  blood sugars, but the last time she checked them today, she does not check  them on a regular basis, they were 200.      REVIEW OF SYSTEMS:  CONSTITUTIONAL:  No fever, chills, or weight loss.  EYES:  No visual symptoms.  ENT:  No sore throat, runny nose, or other URI symptoms.  ENDOCRINE:  No diabetic symptoms.  HEMATOLOGIC/LYMPHATIC:  No excessive bruising or lymph node swelling.  ALLERGIC/IMMUNOLOGIC:  No urticaria or allergy symptoms.  RESPIRATORY:  No cough, shortness of breath, or wheezing.  Complaining of back  pain when she takes a deep breath.    GASTROINTESTINAL:  Nausea, vomiting.  GENITOURINARY:  No dysuria, frequency or urgency.  MUSCULOSKELETAL:  No joint pain or swelling.  INTEGUMENTARY:  No rashes.  NEUROLOGICAL:  No headaches, sensory or motor symptoms.    PAST MEDICAL HISTORY:  Insulin-dependent diabetes, gastroparesis.    FAMILY HISTORY:  Hypertension.    SOCIAL HISTORY:  Does not drink or smoke.    ALLERGIES:   SEE IBEX.    MEDICATIONS:  Multiple and reviewed in Ibex.     PHYSICAL EXAMINATION:  VITAL SIGNS:  Blood pressure 183/86, pulse 117, respirations 19, temperature  98.3, 0 to 10 pain scale is 9 out of 10 to her back, sats 100 percent.    GENERAL APPEARANCE:  Patient appears well developed and well nourished.  Appearance and behavior are age and situation appropriate.   HEENT:  Head:  Normocephalic, atraumatic.  Eyes:  Conjunctivae clear, lids  normal.  Pupils equal, symmetrical, and normally reactive.    NECK:  Supple, nontender, symmetrical, no masses or JVD, trachea midline,  thyroid not enlarged, nodular, or tender.   RESPIRATORY:  Clear and equal breath sounds.  No respiratory distress,  tachypnea, or accessory muscle use.   CARDIOVASCULAR:  Heart regular rate and rhythm without any rubs, murmurs,  gallops or thrills.  GI:  Abdomen soft, nontender, without complaint of pain to palpation.  No  hepatomegaly or splenomegaly.   SKIN:  Warm and dry without rashes.  NEUROLOGICAL:  Alert, oriented.  Sensation intact, motor strength equal and  symmetric.  MUSCULOSKELETAL:  On examination of patient's back when palpating right below  the scapula throughout her entire back from left to right,  there is tenderness  to palpation.  She has no skin rashes.      COURSE IN THE EMERGENCY DEPARTMENT:  At this present time, we will go ahead, since the patient has been vomiting  all day, really has no abdominal discomfort right now just nauseated, we will  make sure she is not in diabetic ketoacidosis, dehydrated.  With this vomiting  and pleuritic pain, we will go ahead and evaluate her to make sure that she  does not have Boerhaave's or sudden onset of PE, that could be causing this as  well.  We will do a CBC, CMP, lipase, VBG, urinalysis , do a CT of the chest.  We will give her 4 mg of Zofran for the nausea, 2 liters of fluid.  If she is  in DKA, we will start her on insulin protocol for DKA.         CONTINUATION BY Octavis Sheeler, MD:      INITIAL ASSESSMENT:    A 28 year old female with history of insulin-dependent diabetes who has  history of gastroparesis.  She vomited throughout most of the day yesterday.  Overnight, she started to have significant pain in her upper back.  Today, we  are going to check basic labs.  We will check a CT of the chest because of her  persistent tachycardia to rule out esophageal injury or PE and proceed  accordingly.      DIAGNOSTIC INTERPRETATIONS:  The patient's urine was positive for nitrites.  Her pregnancy test was  negative.  She had 15 to 29 white cells and 3+ bacteria.  Her white count was  elevated at 15,500.  Her hemoglobin, hematocrit and platelets were  unremarkable.  She had a left shift of 90% segs.  Chemistry and LFTs revealed  a low CO2 at 19, elevated glucose at 398.  She had an anion gap of 12.  Her  alkaline phosphatase was mildly abnormal at 126.  Her lipase was unremarkable.    COURSE IN EMERGENCY DEPARTMENT:  The patient had a VBG that showed a pH of 7.27, pCO2 of 39.  The patient was  discussed with the physician on call for [facility] and admitted to their  service.  In the ED, she was given IV fluids for hydration.  She was given IV  Valium for her suspected muscular pain in her back.  She was given Zofran for  nausea.  She was started on insulin infusion.  She was given morphine for  pain.  She was given IV Cipro after having appropriate cultures performed for  her urinary tract infection.    ADMISSION DIAGNOSES:  1.  Diabetic ketoacidosis, insulin-dependent diabetes mellitus type 1.    2.  Acute urinary tract infection of the kidney.  3.  Dehydration.    The patient is admitted in stable condition.      ___________________  Candace Cruise MD  Dictated By: Hilaria Ota, PA-C    My signature above authenticates this document and my orders, the final  diagnosis (es), discharge prescription (s), and instructions in the [facility]  Pulsecheck record.   Nursing notes have been reviewed by the physician/mid-level provider.    If you have any questions please contact [phone].    ST  D:11/01/2013 18:25:44  T: 11/02/2013 14:78:29  [id number]  Electronically Authenticated by:  Lyman Speller. Luciano Cutter, M.D. On 11/14/2013 04:54 PM EDT

## 2013-11-03 LAB — METABOLIC PANEL, BASIC
BUN: 10 mg/dl (ref 7–25)
CO2: 22 mEq/L (ref 21–32)
Calcium: 8.6 mg/dl (ref 8.5–10.1)
Chloride: 104 mEq/L (ref 98–107)
Creatinine: 0.9 mg/dl (ref 0.6–1.3)
GFR est AA: >60
GFR est non-AA: >60
Glucose: 187 mg/dl — ABNORMAL HIGH (ref 74–106)
Potassium: 3.7 mEq/L (ref 3.5–5.1)
Sodium: 134 mEq/L — ABNORMAL LOW (ref 136–145)

## 2013-11-03 LAB — CBC W/O DIFF
HCT: 37 % (ref 37.0–50.0)
HGB: 12.7 gm/dl — ABNORMAL LOW (ref 13.0–17.2)
MCH: 29.5 pg (ref 25.4–34.6)
MCHC: 34.3 gm/dl (ref 30.0–36.0)
MCV: 86 fL (ref 80.0–98.0)
MPV: 12 fL — ABNORMAL HIGH (ref 6.0–10.0)
PLATELET: 235 10*3/uL (ref 140–450)
RBC: 4.3 M/uL (ref 3.60–5.20)
RDW-SD: 41.3 (ref 36.4–46.3)
WBC: 12.5 10*3/uL — ABNORMAL HIGH (ref 4.0–11.0)

## 2013-11-03 LAB — PHOSPHORUS: Phosphorus: 2.5 mg/dl (ref 2.5–4.9)

## 2013-11-03 LAB — GLUCOSE, POC
Glucose (POC): 208 mg/dL — ABNORMAL HIGH (ref 65–105)
Glucose (POC): 155 mg/dL — ABNORMAL HIGH (ref 65–105)

## 2013-11-03 LAB — MAGNESIUM: Magnesium: 1.7 mg/dl — ABNORMAL LOW (ref 1.8–2.4)

## 2013-11-25 LAB — POC URINE MACROSCOPIC
Bilirubin: NEGATIVE
Glucose: 500 mg/dl — AB
Ketone: 80 mg/dl — AB
Leukocyte Esterase: NEGATIVE
Nitrites: NEGATIVE
Specific gravity: 1.015 (ref 1.005–1.030)
Urobilinogen: 0.2 EU/dl (ref 0.0–1.0)
pH (UA): 6 (ref 5–9)

## 2013-11-25 LAB — METABOLIC PANEL, COMPREHENSIVE
ALT (SGPT): 21 U/L (ref 12–78)
AST (SGOT): 20 U/L (ref 15–37)
Albumin: 3.6 gm/dl (ref 3.4–5.0)
Alk. phosphatase: 114 U/L (ref 45–117)
BUN: 16 mg/dl (ref 7–25)
Bilirubin, total: 0.7 mg/dl (ref 0.2–1.0)
CO2: 18 mEq/L — ABNORMAL LOW (ref 21–32)
Calcium: 9 mg/dl (ref 8.5–10.1)
Chloride: 106 mEq/L (ref 98–107)
Creatinine: 1.1 mg/dl (ref 0.6–1.3)
GFR est AA: >60
GFR est non-AA: >60
Glucose: 414 mg/dl — CR (ref 74–106)
Potassium: 4 mEq/L (ref 3.5–5.1)
Protein, total: 7.7 gm/dl (ref 6.4–8.2)
Sodium: 138 mEq/L (ref 136–145)

## 2013-11-25 LAB — CBC WITH AUTOMATED DIFF
BASOPHILS: 0.3 % (ref 0–3)
EOSINOPHILS: 0 % (ref 0–5)
HCT: 36.6 % — ABNORMAL LOW (ref 37.0–50.0)
HGB: 12.3 gm/dl — ABNORMAL LOW (ref 13.0–17.2)
IMMATURE GRANULOCYTES: 0.4 % (ref 0.0–3.0)
LYMPHOCYTES: 14.7 % — ABNORMAL LOW (ref 28–48)
MCH: 29.6 pg (ref 25.4–34.6)
MCHC: 33.6 gm/dl (ref 30.0–36.0)
MCV: 88 fL (ref 80.0–98.0)
MONOCYTES: 1.9 % (ref 1–13)
MPV: 12.5 fL — ABNORMAL HIGH (ref 6.0–10.0)
NEUTROPHILS: 82.7 % — ABNORMAL HIGH (ref 34–64)
NRBC: 0 (ref 0–0)
PLATELET: 261 10*3/uL (ref 140–450)
RBC: 4.16 M/uL (ref 3.60–5.20)
RDW-SD: 41.2 (ref 36.4–46.3)
WBC: 11.4 10*3/uL — ABNORMAL HIGH (ref 4.0–11.0)

## 2013-11-25 LAB — LIPASE: Lipase: 80 U/L (ref 73–393)

## 2013-11-25 LAB — POC HCG,URINE: HCG urine, QL: NEGATIVE

## 2013-11-25 LAB — GLUCOSE, POC: Glucose (POC): 327 mg/dL — ABNORMAL HIGH (ref 65–105)

## 2013-11-25 NOTE — ED Provider Notes (Addendum)
[facility]  [facility] TREATMENT REPORT  NAME:  [name] [name], [name]  SEX:   F  ADMIT: 11/25/2013  DOB:   08-17-1985  MR#    [id number]  ROOM:    TIME DICTATED: 03 53 PM  ACCT#  [id number]    cc: Jerral BonitoWei Zhao MD    PRIMARY CARE PHYSICIAN:  Jerral BonitoWei Zhao, MD.     DATE AND TIME OF EVALUATION:  Friday, 11/25/2013.    CHIEF COMPLAINT:  Abdominal pain, nausea, vomiting.    HISTORY OF PRESENT ILLNESS:  A 28 year old female presents with 9 out of 10 left lower quadrant abdominal  pain and several episodes of vomiting.  She said it started this morning when  she woke up.  The pain is sharp and does not radiate anywhere.  She denies any  urinary symptoms, no diarrhea, no fevers or chills.  She does have a history  of gastroparesis.  When asked if this is similar to her gastroparesis, she  says she is not sure.  She is a diabetic for which she takes Lantus 28 units  subQ once a day.  The patient does admit to taking Zofran, but that does not  help her with her nausea and she also is on Reglan, which she says she has not  been able to keep down.    REVIEW OF SYSTEMS:  CONSTITUTIONAL:  No fever, chills, or weight loss.  EYES:  No visual symptoms.  ENT:  No sore throat, runny nose, or other URI symptoms.  HEMATOLOGIC/LYMPHATIC:   No excessive bruising or lymph node swelling.  RESPIRATORY:  No cough, shortness of breath, or wheezing.  CARDIOVASCULAR:  No chest pain, chest pressure, or palpitations.  GASTROINTESTINAL:  See HPI.  GENITOURINARY:  No dysuria, frequency, or urgency.  MUSCULOSKELETAL:  No joint pain or swelling.  INTEGUMENTARY:  No rashes.  NEUROLOGICAL:  No headaches, sensory or motor symptoms.    PAST MEDICAL HISTORY:  Diabetes, gastroparesis, hypertension.    PAST SURGICAL HISTORY:  C-section.    SOCIAL HISTORY:  The patient does not smoke or use drugs or alcohol.    MEDICATIONS:  Reviewed in Ibex.     ALLERGIES:  REVIEWED IN IBEX.    PHYSICAL EXAMINATION:   GENERAL APPEARANCE:  Thin female lying on exam table with emesis at hand.  VITAL SIGNS:  Blood pressure is 176/113, pulse is 107, respirations 18,  temperature 98.7, O2 sat 100% on room air.  HEENT:  Eyes:  Conjunctivae clear, lids normal.  Pupils equal, symmetrical,  and normally reactive.    RESPIRATORY:  Clear and equal breath sounds.  No respiratory distress,  tachypnea, or accessory muscle use.    CARDIOVASCULAR:  Heart regular, without murmurs, gallops, rubs, or thrills.    GASTROINTESTINAL:  Abdomen is soft, but diffusely tender to palpation, more so  on the left upper quadrant and the palpation left upper quadrant elicits  vomiting.   MUSCULOSKELETAL:  Nails:  No clubbing or deformities.  Nailbeds pink with  prompt capillary refill.   SKIN:  Warm and dry without rashes.  NEUROLOGIC:  Alert, oriented.  Sensation intact, motor strength equal and  symmetric.       CONTINUATION BY Haze JustinLEWIS Norvel Wenker, MD:      IMPRESSION:  Abdominal pain, nausea and vomiting secondary to recurrent gastroparesis.  The  patient with multiple previous visits related to complications of diabetes as  well as abdominal pain, recurrent gastroparesis.  Old records were reviewed.    DIAGNOSTIC STUDIES:  CBC  with WBC slightly increased to 11.4, hemoglobin and hematocrit 12 and 36.  Urine dip negative, leukocytes negative and negative nitrite.  Urine pregnancy  negative.  BMP:  Glucose elevated 414.  Lipase is unremarkable.    EMERGENCY DEPARTMENT COURSE:  The patient received intravenous fluids, 10 mg of Reglan and 25 mg of  Benadryl.  She did receive a dose of subcutaneous insulin for the elevated  diabetes, received intravenous Phenergan and additional intravenous fluids.  On reevaluated, the patient appears comfortable, states she feels better and  will be discharged home with a prescription for Phenergan with instructions to  follow up with her doctor or return if worsened symptoms or further problems.    CONDITION ON DISCHARGE:  Stable.     FINAL DIAGNOSES:  1.  Acute exacerbation of gastroparesis.  2.  Uncontrolled diabetes mellitus.      ___________________  Konrad FelixLewis H Shadiamond Koska MD  Dictated By: Thea SilversmithSarah Gregory, PA-C    My signature above authenticates this document and my orders, the final  diagnosis (es), discharge prescription (s), and instructions in the [facility]  Pulsecheck record.  Nursing notes have been reviewed by the physician/mid-level provider.    If you have any questions please contact [phone].    MR  D:11/25/2013 15:53:45  T: 11/25/2013 22:47:33  [id number]  Electronically Authenticated by:  Janett BillowLewis H. Henrene HawkingSiegel, M.D. On 12/05/2013 10:40 AM EST

## 2013-11-26 LAB — POC CHEM8
BUN: 24 mg/dl (ref 7–25)
CALCIUM,IONIZED: 5 mg/dL (ref 4.40–5.40)
CO2, TOTAL: 11 mmol/L — CL (ref 21–32)
Chloride: 110 mEq/L — ABNORMAL HIGH (ref 98–107)
Creatinine: 1 mg/dl (ref 0.6–1.3)
Glucose: 587 mg/dL — CR (ref 74–106)
HCT: 40 % (ref 38–45)
HGB: 13.6 gm/dl (ref 13.0–17.2)
Potassium: 5.7 mEq/L — ABNORMAL HIGH (ref 3.5–4.9)
Sodium: 136 mEq/L (ref 136–145)

## 2013-11-26 LAB — METABOLIC PANEL, COMPREHENSIVE
ALT (SGPT): 20 U/L (ref 12–78)
AST (SGOT): 23 U/L (ref 15–37)
Albumin: 3.8 gm/dl (ref 3.4–5.0)
Alk. phosphatase: 131 U/L — ABNORMAL HIGH (ref 45–117)
BUN: 25 mg/dl (ref 7–25)
Bilirubin, total: 0.9 mg/dl (ref 0.2–1.0)
CO2: 9 mEq/L — CL (ref 21–32)
Calcium: 9.5 mg/dl (ref 8.5–10.1)
Chloride: 105 mEq/L (ref 98–107)
Creatinine: 1.6 mg/dl — ABNORMAL HIGH (ref 0.6–1.3)
GFR est AA: 49
GFR est non-AA: 41
Glucose: 575 mg/dl — CR (ref 74–106)
Potassium: 5.6 mEq/L — ABNORMAL HIGH (ref 3.5–5.1)
Protein, total: 8.2 gm/dl (ref 6.4–8.2)
Sodium: 137 mEq/L (ref 136–145)

## 2013-11-26 LAB — CBC WITH AUTOMATED DIFF
ATYPICAL LYMPHS: 1 % — ABNORMAL HIGH (ref 0–0)
HCT: 37.5 % (ref 37.0–50.0)
HGB: 12.1 gm/dl — ABNORMAL LOW (ref 13.0–17.2)
IMMATURE GRANULOCYTES: 1.1 % (ref 0.0–3.0)
LYMPHOCYTES: 4.8 % — ABNORMAL LOW (ref 28–48)
MCH: 29.4 pg (ref 25.4–34.6)
MCHC: 32.3 gm/dl (ref 30.0–36.0)
MCV: 91.2 fL (ref 80.0–98.0)
MONOCYTES: 3.8 % (ref 1–13)
MPV: 12.6 fL — ABNORMAL HIGH (ref 6.0–10.0)
NEUTROPHILS: 89.5 % — ABNORMAL HIGH (ref 34–64)
NRBC: 0 (ref 0–0)
PLATELET COMMENTS: NORMAL
PLATELET: 300 10*3/uL (ref 140–450)
RBC: 4.11 M/uL (ref 3.60–5.20)
RDW-SD: 43.2 (ref 36.4–46.3)
WBC: 27.2 10*3/uL — ABNORMAL HIGH (ref 4.0–11.0)

## 2013-11-26 LAB — CO2
CO2: 8 mEq/L — CL (ref 21–32)
CO2: 15 mEq/L — ABNORMAL LOW (ref 21–32)

## 2013-11-26 LAB — T4, FREE: Free T4: 1.6 ng/dl — ABNORMAL HIGH (ref 0.76–1.46)

## 2013-11-26 LAB — GLUCOSE, POC
Glucose (POC): 421 mg/dL — CR (ref 65–105)
Glucose (POC): 250 mg/dL — ABNORMAL HIGH (ref 65–105)
Glucose (POC): 198 mg/dL — ABNORMAL HIGH (ref 65–105)
Glucose (POC): 175 mg/dL — ABNORMAL HIGH (ref 65–105)
Glucose (POC): 199 mg/dL — ABNORMAL HIGH (ref 65–105)
Glucose (POC): 155 mg/dL — ABNORMAL HIGH (ref 65–105)

## 2013-11-26 LAB — PHOSPHORUS: Phosphorus: 6 mg/dl — ABNORMAL HIGH (ref 2.5–4.9)

## 2013-11-26 LAB — MAGNESIUM: Magnesium: 2 mg/dl (ref 1.8–2.4)

## 2013-11-26 LAB — HEMOGLOBIN A1C WITH EAG: Hemoglobin A1c: 9.3 % — ABNORMAL HIGH (ref 4.8–6.0)

## 2013-11-26 LAB — LIPASE: Lipase: 36 U/L — ABNORMAL LOW (ref 73–393)

## 2013-11-26 LAB — ACETONE/KETONE, QL: Acetone/Ketone serum, QL.: NEGATIVE

## 2013-11-26 LAB — TSH 3RD GENERATION: TSH: 0.309 u[IU]/mL — ABNORMAL LOW (ref 0.358–3.740)

## 2013-11-26 NOTE — H&P (Addendum)
[facility]  History and Physical  NAME:  Lamar SprinklesMOORE, [name]  SEX:   F  ADMIT: 11/26/2013  DOB:1985/03/07  MR#    [id number]  ROOM:  5213  ACCT#  [id number]    I hereby certify this patient for admission based upon medical necessity as  noted below:    <    CHIEF COMPLAINT:  Back pain, abdominal discomfort and nausea.    HISTORY OF PRESENT ILLNESS:  This is a 28 year old type 1 diabetes mellitus the patient with gastroparesis,  hypertension and with frequent hospitalizations for uncontrolled diabetes,  presenting to the emergency room with back pain, abdominal discomfort, nausea  and vomiting.  On arrival to the emergency room, she was noted to have  uncontrolled sugars with low bicarbonate suggestive of diabetic ketoacidosis  and hyperkalemia.  The patient was started on insulin pump and IV fluids per  DKA management and getting admitted for further management.  The patient  states she has been taking her insulin as scheduled and she follows with her  endocrinologist as an outpatient.  She denies any fever, chills, diarrhea,  shortness of breath, cough.  She denies any dysuria, frequency of urination.  Her initial urinalysis was negative for any possible infection.    PAST MEDICAL HISTORY:  Type 1 diabetes mellitus, gastroparesis, hypertension.    PAST SURGICAL HISTORY:  No known surgery.    SOCIAL HISTORY:  Denies any alcohol, illicit drugs, tobacco smoking.    FAMILY HISTORY:  Noncontributory.    ALLERGIES:  ZITHROMAX AND PENICILLIN.    REVIEW OF SYSTEMS:  A 12-point review of system was addressed and positive for the symptoms noted  in the HPI.    PHYSICAL EXAMINATION:  VITAL SIGNS:  On admission blood pressure 146/88, pulse 120, respiratory rate  18, temperature 97.3, oxygen saturation 100% on room air.  GENERAL APPEARANCE:  The patient is thin built, not in any distress.  HEENT:  Head normocephalic, atraumatic.  Pupils equal, round, reactive to   light and accommodation.  Conjunctivae are pink.  Sclerae are anicteric.  Mucous membranes are dry.  NECK:  Supple, no JVD distention.  CARDIOVASCULAR:  S1 and S2 audible with no murmurs.  RESPIRATORY:  Bilaterally equal air entry overall.  No rales, no acute  crepitation.  ABDOMEN:  Soft, flat, nontender.  No spinal tenderness.  No renal angle  tenderness.  EXTREMITIES:  No edema, no calf tenderness.  SKIN:  Intact.  PSYCHIATRIC:  Flat affect, oriented times 3.    LABORATORY:  Sodium 136, potassium 5.7, chloride 110, bicarbonate 11, glucose 587,  creatinine 1.  WBC 27.2, hemoglobin 12.1, hematocrit 37.5, platelets 300.  Lipase 36.    ASSESSMENT:  1.  Diabetic ketoacidosis.  2.  Type 1 diabetes mellitus.  3.  Abdominal discomfort with gastroparesis.  4.  Reactive leukocytosis.  5.  Hyperkalemia with hyperglycemia.  6.  Hyponatremia secondary to hyperglycemia.  7.  Hypertension.    PLAN:  The patient will be admitted to the medical floor, continue on DKA protocol  with IV fluids and insulin infusion and monitor fingersticks.  Would currently  keep n.p.o. and advance diet once her anion gap closes.  The fluids will be  switched with better sugar control.  The patient will be continued to monitor  for electrolyte imbalance including hypokalemia, hyponatremia and anticipate  improving hyperkalemia and once the sugars are better controlled.      ___________________  [name] SavageKULASEGARAM Demarkus Remmel MD  Dictated By: .   MLT  D:11/26/2013 09:52:21  T: 11/26/2013 12:30:54  [id number]  Electronically Authenticated and Edited by:  [name] SavageKULASEGARAM Nica Friske, MD On 12/13/2013 02:25 PM EST

## 2013-11-26 NOTE — ED Provider Notes (Addendum)
[facility]  EMERGENCY DEPARTMENT TREATMENT REPORT  NAME:  [name] [name], [name] [name]  SEX:   F  ADMIT: 11/26/2013  DOB:   [birth year]  MR#    [id number]  ROOM:  5213  TIME DICTATED: 06 54 AM  ACCT#  [id number]        I hereby certify this patient for admission based upon medical necessity as  noted below:    TIME OF EVALUATION:  0340    PRIMARY CARE PROVIDER:  Dr. Dion BodyZhao    CHIEF COMPLAINT:  Nausea, vomiting, mid back pain.    HISTORY OF PRESENT ILLNESS:  A [age] year old female with past medical history significant for diabetes and  gastroparesis, presenting to the Emergency Department today secondary to  persistent vomiting and back pain.  The patient reports that she was in the ER  yesterday with similar like complaints, actually had more abdominal pain.  Her  abdominal pain has now resolved, but she has continued to have vomiting.  She  states that since she was discharged from the hospital about 9:30 yesterday  evening she has had about 10 episodes of vomiting, not relieved with  Phenergan.  Pain to the mid back, described as aching, otherwise denies any  current abdominal pain.    REVIEW OF SYSTEMS:  CONSTITUTIONAL:  No fever.  EYES:   No visual symptoms.  ENT:  No sore throat, runny nose or other URI symptoms.  RESPIRATORY:  Denies shortness of breath.  CARDIOVASCULAR:  No chest pain.  GASTROINTESTINAL:  No abdominal pain, positive vomiting, no diarrhea.  GENITOURINARY:  Reports frequency  Otherwise, denies burning with urination.  MUSCULOSKELETAL:  Reports back pain.  INTEGUMENTARY:  No rashes.  NEUROLOGIC:  No headache.    PAST MEDICAL HISTORY:  Diabetes, gastroparesis, C-section.    SOCIAL HISTORY:  Nonsmoker.    FAMILY HISTORY:  Denies history of diabetes.    MEDICATIONS:  Reviewed.    ALLERGIES:  REVIEWED.    PHYSICAL EXAMINATION:  VITAL SIGNS:  Blood pressure 146/88, pulse 120, respirations 18, temperature  97.3, pain 10 out of 10, O2 saturation 100% on room air.   GENERAL:  The patient well nourished, well developed, answering questions  appropriately, no distress.  EYES:  Conjunctivae clear, lids normal.  Pupils equal, symmetrical, and  normally reactive.  ENT:  Mouth mucous membranes somewhat moist.  RESPIRATORY:  Clear and equal breath sounds.  No respiratory distress,  tachypnea, or accessory muscle use.    CARDIOVASCULAR:   Heart regular, without murmurs, gallops, rubs, or thrills.    GASTROINTESTINAL:  Normoactive bowel sounds.  Abdomen is soft, minimal  tenderness in the epigastric region.  No rebound, no guarding appreciated.  MUSCULOSKELETAL:   No localized midline tenderness to back.  No flank pain.  ____ on evaluation, full range of motion.  Normal stance and gait.  No ataxia.  SKIN:  No rashes.  NEUROLOGIC:  Alert, oriented.  Sensation intact, motor strength equal and  symmetric.      CONTINUATION BY Rotunda Worden HIMMEL-Derelle Cockrell, DO:      INITIAL ASSESSMENT AND MANAGEMENT PLAN:    The patient is a 28 year old lady who was here yesterday for similar  complaints.  She presents here with nausea, vomiting, abdominal pain across  the mid back region and low in the pelvis.  The patient has type 1 diabetes  and has gastroparesis.  The patient states that this does not feel like her  usual gastroparesis.  The patient noted to have a blood sugar 412 yesterday.  The patient's CO2 was 18.  The patient's recheck sugar was 312 before being  dispositioned home.  She did state that she felt better before being  dispositioned home and she is back really for the nausea and vomiting  continuing.  The patient will be hydrated.  We will check her sugar.  We will  medicate her for nausea as needed.    DIAGNOSTIC STUDIES:  The patient's CBC shows a white blood cell count of 27.2 thousand, hemoglobin  is 12, the patient has a left shift.  Comp metabolic shows potassium of 5.6, a  carbon dioxide 9, glucose 575 and a creatinine of 1.6.  The patient's alkaline   phosphatase 131 consistent with her vomiting.  Lipase 36.  The patient's  laboratory results were felt to be hemolyzed by those working in the lab and  therefore she had a bedside i-STAT Chem-8 drawn which showed a potassium of  5.7 and a CO2 of 11 with a glucose of 587.    COURSE IN THE EMERGENCY DEPARTMENT:  The patient given Benadryl and Phenergan, both 25 mg, IV push and IV med  infusion respectively with a liter of fluid while we await laboratory studies.  The patient was started on insulin IV med infusion for DKA protocol minus the  supplemental potassium.  The patient was then started on fluids 250 mL an  hour.  She was given 1 amp of sodium bicarbonate 4.2 grams IV push in  treatment for her CO2 of 9.    The patient was reevaluated and had a nice soft abdomen.  She continues to  feel unwell and hold the \\yucky bag\\ close to her face.  The patient has  given a urine, but it has not been tested yet.  The patient getting a 2-view  chest x-ray for possible infectious causes such as aspiration pneumonia that  may be responsible for white blood cell count of 27,000.  On review of the  x-ray, though it has not been read by radiology, it would suggest that she  does not have any acute intrathoracic processes causing her elevated white  blood cell count.  Will continue without her urine, in the meantime the  patient admitted to Dr. Wenda LowMatriano of the hospitalist group who agrees with the  DKA protocol minus potassium supplementation.    CLINICAL IMPRESSION AND DIAGNOSES:  1.  Diabetic ketoacidosis.  2.  Abdominal pain.  3.  Hyperkalemia.  4.  Nausea with vomiting.    DISPOSITION AND PLAN:  The patient going to an inpatient telemetry bed with treatment in progress.      ___________________  Liberty HandyJennifer H Himmel-Avarie Tavano DO  Dictated By: Kristeen MansBrittany Irwin, PA    My signature above authenticates this document and my orders, the final  diagnosis (es), discharge prescription (s), and instructions in the [facility]  Pulsecheck record.   Nursing notes have been reviewed by the physician/mid-level provider.    If you have any questions please contact [phone].    MLT  D:11/26/2013 06:54:00  T: 11/26/2013 17:32:34  [id number]  Electronically Authenticated and Edited byCarolin Guernsey:  Tattiana Fakhouri HIMMEL-Amatullah Christy, DO On 12/10/2013 08:21 PM EST

## 2013-11-27 LAB — POC URINE MICROSCOPIC

## 2013-11-27 LAB — METABOLIC PANEL, BASIC
BUN: 17 mg/dl (ref 7–25)
CO2: 16 mEq/L — ABNORMAL LOW (ref 21–32)
Calcium: 8.3 mg/dl — ABNORMAL LOW (ref 8.5–10.1)
Chloride: 108 mEq/L — ABNORMAL HIGH (ref 98–107)
Creatinine: 1.1 mg/dl (ref 0.6–1.3)
GFR est AA: >60
GFR est non-AA: >60
Glucose: 131 mg/dl — ABNORMAL HIGH (ref 74–106)
Potassium: 3.7 mEq/L (ref 3.5–5.1)
Sodium: 138 mEq/L (ref 136–145)

## 2013-11-27 LAB — GLUCOSE, POC
Glucose (POC): 104 mg/dL (ref 65–105)
Glucose (POC): 117 mg/dL — ABNORMAL HIGH (ref 65–105)
Glucose (POC): 117 mg/dL — ABNORMAL HIGH (ref 65–105)
Glucose (POC): 119 mg/dL — ABNORMAL HIGH (ref 65–105)
Glucose (POC): 138 mg/dL — ABNORMAL HIGH (ref 65–105)
Glucose (POC): 141 mg/dL — ABNORMAL HIGH (ref 65–105)
Glucose (POC): 150 mg/dL — ABNORMAL HIGH (ref 65–105)
Glucose (POC): 162 mg/dL — ABNORMAL HIGH (ref 65–105)
Glucose (POC): 200 mg/dL — ABNORMAL HIGH (ref 65–105)
Glucose (POC): 230 mg/dL — ABNORMAL HIGH (ref 65–105)
Glucose (POC): 262 mg/dL — ABNORMAL HIGH (ref 65–105)
Glucose (POC): 262 mg/dL — ABNORMAL HIGH (ref 65–105)
Glucose (POC): 265 mg/dL — ABNORMAL HIGH (ref 65–105)

## 2013-11-27 LAB — URINALYSIS W/O MICRO
Bilirubin: NEGATIVE
Glucose: 500 mg/dl — AB
Ketone: 40 mg/dl — AB
Leukocyte Esterase: NEGATIVE
Nitrites: NEGATIVE
Protein: 30 mg/dl — AB
Specific gravity: >=1.03 (ref 1.005–1.030)
Urobilinogen: 0.2 mg/dl (ref 0.0–1.0)
pH (UA): 6 (ref 5.0–9.0)

## 2013-11-27 LAB — CALCIUM: Calcium: 8.4 mg/dl — ABNORMAL LOW (ref 8.5–10.1)

## 2013-11-27 LAB — MAGNESIUM: Magnesium: 1.7 mg/dl — ABNORMAL LOW (ref 1.8–2.4)

## 2013-11-27 LAB — STREP AG SCREEN, GROUP A: STREP A SCREEN: NEGATIVE

## 2013-11-27 LAB — CO2
CO2: 19 mEq/L — ABNORMAL LOW (ref 21–32)
CO2: 17 mEq/L — ABNORMAL LOW (ref 21–32)

## 2013-11-27 LAB — PHOSPHORUS: Phosphorus: 2.3 mg/dl — ABNORMAL LOW (ref 2.5–4.9)

## 2013-11-28 LAB — METABOLIC PANEL, BASIC
BUN: 9 mg/dl (ref 7–25)
BUN: 7 mg/dl (ref 7–25)
CO2: 15 mEq/L — ABNORMAL LOW (ref 21–32)
CO2: 20 mEq/L — ABNORMAL LOW (ref 21–32)
Calcium: 8.1 mg/dl — ABNORMAL LOW (ref 8.5–10.1)
Calcium: 8 mg/dl — ABNORMAL LOW (ref 8.5–10.1)
Chloride: 104 mEq/L (ref 98–107)
Chloride: 106 mEq/L (ref 98–107)
Creatinine: 1 mg/dl (ref 0.6–1.3)
Creatinine: 1 mg/dl (ref 0.6–1.3)
GFR est AA: >60
GFR est AA: >60
GFR est non-AA: >60
GFR est non-AA: >60
Glucose: 252 mg/dl — ABNORMAL HIGH (ref 74–106)
Glucose: 160 mg/dl — ABNORMAL HIGH (ref 74–106)
Potassium: 3.4 mEq/L — ABNORMAL LOW (ref 3.5–5.1)
Potassium: 3.4 mEq/L — ABNORMAL LOW (ref 3.5–5.1)
Sodium: 135 mEq/L — ABNORMAL LOW (ref 136–145)
Sodium: 137 mEq/L (ref 136–145)

## 2013-11-28 LAB — GLUCOSE, POC
Glucose (POC): 92 mg/dL (ref 65–105)
Glucose (POC): 139 mg/dL — ABNORMAL HIGH (ref 65–105)
Glucose (POC): 113 mg/dL — ABNORMAL HIGH (ref 65–105)
Glucose (POC): 241 mg/dL — ABNORMAL HIGH (ref 65–105)
Glucose (POC): 116 mg/dL — ABNORMAL HIGH (ref 65–105)
Glucose (POC): 156 mg/dL — ABNORMAL HIGH (ref 65–105)
Glucose (POC): 137 mg/dL — ABNORMAL HIGH (ref 65–105)
Glucose (POC): 162 mg/dL — ABNORMAL HIGH (ref 65–105)

## 2013-12-09 LAB — POC CHEM8
BUN: 18 mg/dl (ref 7–25)
CALCIUM,IONIZED: 5 mg/dL (ref 4.40–5.40)
CO2, TOTAL: 22 mmol/L (ref 21–32)
Chloride: 102 mEq/L (ref 98–107)
Creatinine: 0.9 mg/dl (ref 0.6–1.3)
Glucose: 233 mg/dL — ABNORMAL HIGH (ref 74–106)
HCT: 38 % (ref 38–45)
HGB: 12.9 gm/dl — ABNORMAL LOW (ref 13.0–17.2)
Potassium: 4.2 mEq/L (ref 3.5–4.9)
Sodium: 137 mEq/L (ref 136–145)

## 2013-12-09 LAB — CBC WITH AUTOMATED DIFF
BASOPHILS: 0.3 % (ref 0–3)
EOSINOPHILS: 0 % (ref 0–5)
HCT: 35.7 % — ABNORMAL LOW (ref 37.0–50.0)
HGB: 12.1 gm/dl — ABNORMAL LOW (ref 13.0–17.2)
IMMATURE GRANULOCYTES: 0.4 % (ref 0.0–3.0)
LYMPHOCYTES: 10 % — ABNORMAL LOW (ref 28–48)
MCH: 29.8 pg (ref 25.4–34.6)
MCHC: 33.9 gm/dl (ref 30.0–36.0)
MCV: 87.9 fL (ref 80.0–98.0)
MONOCYTES: 4.1 % (ref 1–13)
MPV: 11.9 fL — ABNORMAL HIGH (ref 6.0–10.0)
NEUTROPHILS: 85.2 % — ABNORMAL HIGH (ref 34–64)
NRBC: 0 (ref 0–0)
PLATELET: 301 10*3/uL (ref 140–450)
RBC: 4.06 M/uL (ref 3.60–5.20)
RDW-SD: 43.8 (ref 36.4–46.3)
WBC: 17.9 10*3/uL — ABNORMAL HIGH (ref 4.0–11.0)

## 2013-12-09 LAB — POC URINE MACROSCOPIC
Blood: NEGATIVE
Glucose: 500 mg/dl — AB
Ketone: >=160 mg/dl — AB
Leukocyte Esterase: NEGATIVE
Nitrites: NEGATIVE
Protein: 100 mg/dl — AB
Specific gravity: >=1.03 (ref 1.005–1.030)
Urobilinogen: 0.2 EU/dl (ref 0.0–1.0)
pH (UA): 6 (ref 5–9)

## 2013-12-09 LAB — POC BLOOD GAS + LACTIC ACID
BASE EXCESS: -1 mmol/L (ref <=3)
BICARBONATE: 23.1 mmol/L (ref 22.0–26.0)
CO2, TOTAL: 24 mmol/L (ref 21–32)
Lactic Acid: 1.09 mmol/L (ref 0.40–2.00)
O2 SAT: 93 % — ABNORMAL HIGH (ref 70–75)
PCO2: 32.2 mm Hg — ABNORMAL LOW (ref 41.0–51.0)
PO2: 61 mm Hg — ABNORMAL HIGH (ref 35–40)
pH: 7.464 — ABNORMAL HIGH (ref 7.310–7.410)

## 2013-12-09 LAB — CT/GC DNA (PROBETEC)
CHLAMYDIA TRACHOMATIS DNA: NOT DETECTED
NEISSERIA GONORRHOEAE DNA: NOT DETECTED

## 2013-12-09 LAB — POC HCG,URINE: HCG urine, QL: NEGATIVE

## 2013-12-09 LAB — LIPASE: Lipase: 41 U/L — ABNORMAL LOW (ref 73–393)

## 2013-12-10 LAB — METABOLIC PANEL, COMPREHENSIVE
ALT (SGPT): 16 U/L (ref 12–78)
AST (SGOT): 12 U/L — ABNORMAL LOW (ref 15–37)
Albumin: 3.5 gm/dl (ref 3.4–5.0)
Alk. phosphatase: 103 U/L (ref 45–117)
BUN: 16 mg/dl (ref 7–25)
Bilirubin, total: 0.8 mg/dl (ref 0.2–1.0)
CO2: 19 mEq/L — ABNORMAL LOW (ref 21–32)
Calcium: 8.4 mg/dl — ABNORMAL LOW (ref 8.5–10.1)
Chloride: 101 mEq/L (ref 98–107)
Creatinine: 1 mg/dl (ref 0.6–1.3)
GFR est AA: >60
GFR est non-AA: >60
Glucose: 278 mg/dl — ABNORMAL HIGH (ref 74–106)
Potassium: 4.4 mEq/L (ref 3.5–5.1)
Protein, total: 7.3 gm/dl (ref 6.4–8.2)
Sodium: 133 mEq/L — ABNORMAL LOW (ref 136–145)

## 2013-12-10 LAB — CBC WITH AUTOMATED DIFF
BASOPHILS: 0.3 % (ref 0–3)
EOSINOPHILS: 0 % (ref 0–5)
HCT: 34.9 % — ABNORMAL LOW (ref 37.0–50.0)
HGB: 11.6 gm/dl — ABNORMAL LOW (ref 13.0–17.2)
IMMATURE GRANULOCYTES: 0.5 % (ref 0.0–3.0)
LYMPHOCYTES: 5.6 % — ABNORMAL LOW (ref 28–48)
MCH: 29.6 pg (ref 25.4–34.6)
MCHC: 33.2 gm/dl (ref 30.0–36.0)
MCV: 89 fL (ref 80.0–98.0)
MONOCYTES: 2.5 % (ref 1–13)
MPV: 11.3 fL — ABNORMAL HIGH (ref 6.0–10.0)
NEUTROPHILS: 91.1 % — ABNORMAL HIGH (ref 34–64)
NRBC: 0 (ref 0–0)
PLATELET: 250 10*3/uL (ref 140–450)
RBC: 3.92 M/uL (ref 3.60–5.20)
RDW-SD: 43.8 (ref 36.4–46.3)
WBC: 17.1 10*3/uL — ABNORMAL HIGH (ref 4.0–11.0)

## 2013-12-10 NOTE — ED Provider Notes (Addendum)
[facility]  EMERGENCY DEPARTMENT TREATMENT REPORT  NAME:  [name] [name], [name] [name]  SEX:   F  ADMIT: 12/09/2013  DOB:   01-Jun-1985  MR#    [id number]  ROOM:    TIME DICTATED: 03 47 PM  ACCT#  [id number]        CHIEF COMPLAINT:  Left-sided abdominal pain, back pain, nausea, too numerous to count vomiting  episodes.    HISTORY OF PRESENT ILLNESS:  A 28 year old female, a type 1 diabetic who has had blood sugars running in  the 300s who reports that she had multiple episodes of vomiting.  She feels as  though she has got some pain on the left side of her abdomen, worse with  vomiting.  She denies any diarrhea.  She has been having normal bowel  movements.  She feels dehydrated.  She states that when this happens to her  she usually gets cramping muscular pain to her mid back region, usually from  the dry heaving.  She reports that she had cold symptoms of runny nose and  nasal congestion  approximately 2 weeks ago, but that has now started to  resolve.  She did have some coughing with that, but has also started to get  better.  She states she has been on Depo.  She just started to have some  bleeding which is atypical for her, the last shot was last month.  She denies  any concern for sexually transmitted diseases or lower pelvic discomfort.  She  denies any fever or chills.    REVIEW OF SYSTEMS:  CONSTITUTIONAL:  Denies fever, chills.  EYES:  No visual symptoms.  ENT:  She says it only hurts her throat when she vomits, she did have URI  symptoms but those have since resolved.  RESPIRATORY:  She had a cough that has mostly resolved.  She does not feel  short of breath, no wheezing.  CARDIOVASCULAR:  Denies chest pain, chest pressure, or palpitations.  GASTROINTESTINAL:  She has had multiple episodes of vomiting. Denies diarrhea.  She has abdominal pain, mostly epigastric left upper quadrant.  GENITOURINARY:  Denies any dysuria, frequency or urgency.  No vaginal   discharge or pain with intercourse.  She does note that she has had some light  vaginal bleeding.  MUSCULOSKELETAL:  No joint pain or swelling.  INTEGUMENTARY:  No rashes.  NEUROLOGICAL:  No headaches, sensory or motor symptoms.    PAST MEDICAL HISTORY:  History of type 1 diabetes, gastroparesis, hypertension, prior history of  C-section.    SOCIAL HISTORY:  Nonsmoker, no alcohol abuse.  No drug abuse.    MEDICATIONS:  Lantus, Phenergan, lisinopril, Reglan, Apidra.    ALLERGIES:  PENICILLIN, ZITHROMAX.    PHYSICAL EXAMINATION:  VITAL SIGNS:  Blood pressure 168/93, pulse 128, respirations 20, temperature  98.4, O2 saturation 100% on room air.  GENERAL APPEARANCE:  Well-developed, well-nourished female who is sitting  upright in the stretcher.  She does not appear to feel well.  She is nauseated  with blue bag in hand.  HEENT:  Eyes:  Conjunctivae are clear.    CONTINUATION BY Jannetta QuintOURTNEY Terral Cooks, MD:    PHYSICAL EXAMINATION:  VITAL SIGNS:  Blood pressure 168/93 pulse of 128, respirations 20, temperature  is 98.4, O2 saturation 100% on room air.  GENERAL APPEARANCE:  Female sitting upright in the stretcher.  She appears  uncomfortable.  She has a blue bag in front of her.  HEENT:  Eyes:  Conjunctivae clear, lids are normal.  Pupils equal, round and  reactive.  ENT:  Posterior oropharynx is dry.  Mucous membranes clear.  RESPIRATORY:  Clear to auscultation bilaterally, no respiratory distress,  tachypnea or accessory muscle use.  CARDIAC:  Tachycardic, but regular.  No significant peripheral edema.  GASTROINTESTINAL:  Abdomen is soft.  She does have some tenderness in the  epigastric and left upper quadrant.  She has no lower quadrant pain and has  suprapubic tenderness, on exam.    MUSCULOSKELETAL:  She does have some tenderness to the musculoskeletal, at the  midthoracic, from T10 to L3, where it is completely reproducible on exam to  palpation.  She has no midline tenderness to the thoracic or lumbar spine.   SKIN:  Otherwise, warm and dry without rashes.  NEUROLOGIC:  Alert, oriented.  Sensation intact, motor strength equal and  symmetric.   GU:  Female External genitalia is overall normal.  She does have a scar in the  posterior fourchette area or discoloration that is a patch of white skin in  this area.  I believe this is a Vittiglio. Vaginal walls are otherwise  unremarkable.  On speculum exam, she does have some dark blood coming from the  os, the os is closed.  There is no abnormal discharge.  She has no cervical  motion tenderness.  She has some minimal tenderness on the left side; however,  it is not actually over the adnexa, it is a little bit higher.  But, it is not  significant.  She has no tenderness with cervical range of motion or right  ovary tenderness.    INITIAL ASSESSMENT AND MANAGEMENT PLAN:  This is a 28 year old female presenting to the Emergency Department, multiple  episodes of vomiting of left upper quadrant and epigastric discomfort.  Basic  labs were obtained.  She was evaluated for the possibility of DKA.  Because  she continued to have symptoms with fluids and her tenderness in the left  upper quadrant, CT abdomen and pelvis was performed.    DIAGNOSTIC INTERPRETATIONS:  Chest x-ray is negative.  Urinalysis is 500.  Glucose:  Small bilirubin,  greater than 160 ketones, negative nitrates, negative leukocyte esterase.  Point of care chem8 showed a glucose of 233, but a bicarb of 222.  Hemoglobin  is 12.9.  Her anion gap is 13.  On calculation, her  pH is 7.464.  On venous  gas, her lactate is 1.09.  Her CBC shows a white count of 17.9, hemoglobin  12.1, hematocrit 35.7 and a platelet count of 302.  Lipase is 41.  CT abdomen  and pelvis with IV contrast shows findings were a significant change, compared  to prior examination.  A small amount of low-density-dependent pelvic free  fluid, likely physiologically and stable.  Focal finding of fatty infiltration   adjacent to the caliciform ligament.  Wet prep is no yeast or Trichomonas  seen, no clue cells.  Negative gonorrhea, negative chlamydia    EMERGENCY DEPARTMENT COURSE:  The patient was attached to blood pressure, cardiac and pulse oximetry  monitoring.  The patient was tachycardic and this improved after 2 liters of  IV fluids.  She was then started on a 100 an hour.  She was given Zofran and  morphine.  Repeat abdominal exam:  Most of her tenderness had mostly improved.  Her pelvic exam was unremarkable.  I suspect this is a possibility of a  flareup of her gastroparesis with the leukocytosis being related to the  vomiting and  dehydration.  She feels much improved, at this time.  I have  discharged her home with Zofran. Asked her to follow up and maintain control  of her blood sugars using her sliding scale.  She understands the plan of care  and she is comfortable.  She has been up and ambulating.  She does feel  improved after the fluid hydration.    ED DIAGNOSES:  1.  Acute dehydration.    2.  Hyperglycemia and insulin-dependent diabetes without evidence of diabetic  ketoacidosis.  3.  Left upper quadrant abdominal pain.    PLAN:  As above.      ___________________  Lucio Edwardourtney T Mitcheal Sweetin MD  Dictated By: Marland Kitchen.     My signature above authenticates this document and my orders, the final  diagnosis (es), discharge prescription (s), and instructions in the [facility]  Pulsecheck record.  Nursing notes have been reviewed by the physician/mid-level provider.    If you have any questions please contact [phone].    FS  D:12/09/2013 15:47:14  T: 12/10/2013 09:49:33  [id number]  Electronically Authenticated by:  Lucio EdwardOURTNEY T Kioni Stahl, MD On 12/21/2013 11:12 PM EST

## 2013-12-10 NOTE — ED Provider Notes (Addendum)
[facility]  EMERGENCY DEPARTMENT TREATMENT REPORT  NAME:  [name] [name], [name] [name]  SEX:   F  ADMIT: 12/10/2013  DOB:   07-06-1985  MR#    [id number]  ROOM:    TIME DICTATED: 01 28 PM  ACCT#  [id number]    cc: Jerral BonitoWei Zhao MD    DATE OF SERVICE:  12/10/13    CHIEF COMPLAINT:  Abdominal pain.    HISTORY OF PRESENT ILLNESS:  This is a 28 year old young lady who presents complaining of a 3-day history  of diffuse abdominal pain.  The patient has a history of chronic abdominal  pain and gastroparesis, and has been seen multiple times before for this.  She  was here last night, was seen at that time for the exact same pain she is  having now.  She received a full evaluation including laboratory studies and a  CT scan of the abdomen and pelvis.  CT scan results, which I reviewed,  appeared to be essentially normal.  The patient states that she went home and  continued to have abdominal pain, and has returned for a 2nd evaluation.  The  pain is epigastric in nature, radiates around to the back.    REVIEW OF SYSTEMS:  CONSTITUTIONAL:  No fever, chills, or weight loss.  EYES:   No visual symptoms.  ENT:  No sore throat, runny nose, or other URI symptoms.  ENDOCRINE:  No diabetic symptoms.  RESPIRATORY:  No cough, shortness of breath, or wheezing.   CARDIOVASCULAR:  No chest pain, chest pressure, or palpitations.  GENITOURINARY:  No dysuria, frequency, or urgency.  MUSCULOSKELETAL:  No joint pain or swelling.  INTEGUMENTARY:  No rashes.    PAST MEDICAL HISTORY:  Diabetes, gastroparesis, and hypertension.    MEDICATIONS:  Apidra, Lantus, lisinopril, Phenergan, Reglan, and Zofran.    ALLERGIES:  PENICILLIN AND AZITHROMYCIN.    SOCIAL HISTORY:  Denies tobacco.  Denies alcohol.    FAMILY HISTORY:  Diabetes.    PHYSICAL EXAMINATION:  VITAL SIGNS:  Blood pressure 160/84, pulse 119, respiratory rate 22,  temperature 98.8, pulse oximetry 100% on room air.  GENERAL:  This is a thin lady.  Alert and oriented, in no acute distress.  She   was actually asleep when I went into the room to evaluate her and had to wake  her up.  HEENT:  Eyes:  Conjunctivae clear, lids normal.  Pupils equal, symmetrical,  and normally reactive.  Ears/Nose:  Hearing is grossly intact to voice.  Internal and external examinations of the ears and nose are unremarkable.  Mucous membranes appear to be mildly dry.    RESPIRATORY:  Clear and equal breath sounds.  No respiratory distress,  tachypnea, or accessory muscle use.    CARDIOVASCULAR:   Heart regular, without murmurs, gallops, rubs, or thrills.  No peripheral edema or significant varicosities.  Vascular:  Calves soft and  nontender.  No peripheral edema or significant varicosities.  Carotid,  femoral, and pedal pulses are satisfactory.  The abdominal aorta is not  palpably enlarged.  ABDOMEN:  Soft.  I am not really able to appreciate any abdominal tenderness  except in the left upper quadrant, where it is very mild.  No guarding, no  rebound appreciated.  The rest of the abdomen is entirely nontender.    NAILS:  No clubbing or deformities.  Nailbeds pink with prompt capillary   refill.  SKIN:  Warm and dry without rashes.    INITIAL ASSESSMENT AND MANAGEMENT PLAN:  This is a new problem for this patient.  The patient was here last night and  had a full evaluation including CT scan of her abdomen.  At this point, we  will give her some fluids, recheck some labs to be sure that nothing has  changed.      DIAGNOSTIC STUDIES:  The following were obtained.  Comprehensive metabolic panel: Sodium 133,  carbon dioxide 19, glucose 278, otherwise normal.  Hepatic function panel is  normal.  CBC: White blood cell count 17.1, hemoglobin 11.6, hematocrit 34.9,  and platelet count 250.  Pregnancy test was checked last night and will not be  rechecked today.  Urinalysis last night showed no signs of infection.    EMERGENCY DEPARTMENT COURSE:  The patient has been stable through her stay in Emergency Department.  She did   receive a bolus of IV normal saline.  She received a single dose of IV pain  medication which was morphine, and she received a dose of Zofran.  She is  feeling much better, and her abdomen at this point remains benign.  I can  really find no significant tenderness with the exception of the very mild  tenderness in the left upper quadrant.  Given the fact the patient had a CT  scan last night, which I have reviewed and which was essentially normal  showing no signs of any abdominal process, I do not think we need to repeat  imaging today.  The patient did have a white blood cell count elevated last  night and today.  I am not sure of the etiology of that, and it may be from  the vomiting.  On a previous visit, she had a white blood cell count of 27, so  this is definitely an improvement and again, I see no signs of any acute  life-threatening process.  Her blood sugar is elevated but her anion gap  calculates out to 13, so I do not think she is in DKA at this time.    CLINICAL IMPRESSION AND DIAGNOSES:  1.  Evaluation of acute on chronic abdominal pain.  2.  Dehydration.  3.  Hyperglycemia.    DISPOSITION AND PLAN:  She is discharged in stable condition.  She is advised to contact Dr. Dion BodyZhao on  Monday for followup.  She is advised to come back immediately for worsening  pain, fever, vomiting, or any other concerns.      ___________________  Posey Prontoobert E Nicle Connole MD  Dictated By: Marland Kitchen.     My signature above authenticates this document and my orders, the final  diagnosis (es), discharge prescription (s), and instructions in the [facility]  Pulsecheck record.  Nursing notes have been reviewed by the physician/mid-level provider.    If you have any questions please contact [phone].    MC  D:12/10/2013 13:28:07  T: 12/10/2013 23:57:06  [id number]  Electronically Authenticated by:  Posey Prontoobert E. Rajni Holsworth, M.D. On 12/13/2013 11:52 AM EST

## 2013-12-13 LAB — CBC WITH AUTOMATED DIFF
BASOPHILS: 1 % (ref 0–3)
EOSINOPHILS: 2.9 % (ref 0–5)
HCT: 40.7 % (ref 37.0–50.0)
HGB: 13.5 gm/dl (ref 13.0–17.2)
LYMPHOCYTES: 13.6 % — ABNORMAL LOW (ref 28–48)
MCH: 29.5 pg (ref 25.4–34.6)
MCHC: 33.2 gm/dl (ref 30.0–36.0)
MCV: 88.9 fL (ref 80.0–98.0)
MONOCYTES: 2.9 % (ref 1–13)
MPV: 11.4 fL — ABNORMAL HIGH (ref 6.0–10.0)
NEUTROPHILS: 76.7 % — ABNORMAL HIGH (ref 34–64)
NRBC: 0 (ref 0–0)
PLASMA CELL: 3
PLATELET COMMENTS: NORMAL
PLATELET: 288 10*3/uL (ref 140–450)
RBC Morphology: NORMAL
RBC: 4.58 M/uL (ref 3.60–5.20)
RDW-SD: 41.9 (ref 36.4–46.3)
WBC: 11 10*3/uL (ref 4.0–11.0)

## 2013-12-13 LAB — METABOLIC PANEL, COMPREHENSIVE
ALT (SGPT): 15 U/L (ref 12–78)
AST (SGOT): 12 U/L — ABNORMAL LOW (ref 15–37)
Albumin: 3.9 gm/dl (ref 3.4–5.0)
Alk. phosphatase: 109 U/L (ref 45–117)
BUN: 12 mg/dl (ref 7–25)
Bilirubin, total: 0.7 mg/dl (ref 0.2–1.0)
CO2: 19 mEq/L — ABNORMAL LOW (ref 21–32)
Calcium: 9.2 mg/dl (ref 8.5–10.1)
Chloride: 100 mEq/L (ref 98–107)
Creatinine: 1.1 mg/dl (ref 0.6–1.3)
GFR est AA: >60
GFR est non-AA: >60
Glucose: 358 mg/dl — ABNORMAL HIGH (ref 74–106)
Potassium: 3.6 mEq/L (ref 3.5–5.1)
Protein, total: 8.1 gm/dl (ref 6.4–8.2)
Sodium: 133 mEq/L — ABNORMAL LOW (ref 136–145)

## 2013-12-13 LAB — POC URINE MACROSCOPIC
Bilirubin: NEGATIVE
Glucose: 500 mg/dl — AB
Ketone: 80 mg/dl — AB
Leukocyte Esterase: NEGATIVE
Nitrites: NEGATIVE
Protein: 30 mg/dl — AB
Specific gravity: 1.015 (ref 1.005–1.030)
Urobilinogen: 1 EU/dl (ref 0.0–1.0)
pH (UA): 6 (ref 5–9)

## 2013-12-13 LAB — GLUCOSE, POC: Glucose (POC): 322 mg/dL — ABNORMAL HIGH (ref 65–105)

## 2013-12-13 LAB — LIPASE: Lipase: 77 U/L (ref 73–393)

## 2013-12-13 LAB — POC HCG,URINE: HCG urine, QL: NEGATIVE

## 2013-12-13 NOTE — ED Provider Notes (Addendum)
[facility]  EMERGENCY [facility]MENT TREATMENT REPORT  NAME:  [name] [name], [name] [name]  SEX:   F  ADMIT: 12/13/2013  DOB:   1985/08/13  MR#    [id number]  ROOM:  4228  TIME DICTATED: 12 57 PM  ACCT#  [id number]    cc: Jerral BonitoWei Zhao MD    I hereby certify this patient for admission based upon medical necessity as  noted below:      PRIMARY CARE PHYSICIAN:  Jerral BonitoWei Zhao, MD      CHIEF COMPLAINT:  Vomiting, abdominal pain.    HISTORY OF PRESENT ILLNESS:  This is a 28 year old diabetic female who presents to the Emergency Department  with complaints of nausea, vomiting and abdominal pain.  The patient states  she has a history of gastroparesis.  She has been seen in this ER 3 days ago,  evaluated and discharged.  She states that she is never truly clear where her  nausea, vomiting and her pain is worsening to the left upper quadrant of the  abdomen.  It is currently 8 out of 10.  She denies any fever, denies any  dysuria along with the symptoms.  She states that her Zofran, Reglan,  Phenergan is not helping alleviate her symptoms.    REVIEW OF SYSTEMS:  CONSTITUTIONAL:  Denies fever or chills.  EYES:  Denies visual complaints.  ENT:  Denies sore throat.  ENDOCRINE:  Denies increased thirst or urination.  HEMATOLOGIC/LYMPHATIC:   No excessive bruising or lymph node swelling.   RESPIRATORY:  No cough, shortness of breath, or wheezing.   CARDIOVASCULAR:   Denies chest pain.  GASTROINTESTINAL:  As above.  MUSCULOSKELETAL:  Denies joint pain.  INTEGUMENTARY:  Denies rashes.  NEUROLOGIC:  Denies headache.    PAST MEDICAL HISTORY:  Does include diabetes type 1, hypertension.    SOCIAL HISTORY:  Denies tobacco, alcohol or recreational drug use.    FAMILY HISTORY:  Hypertension.    CURRENT MEDICATIONS:  Apidra, Lantus, lisinopril, Phenergan, Reglan, Zofran ODT.    PHYSICAL EXAMINATION:  VITAL SIGNS:  Blood pressure 171/101, pulse 117, respiration 20, temperature  98, pain 8, O2 sat 99%.   GENERAL APPEARANCE:  Patient appears well developed and well nourished.  Appearance and behavior are age and situation appropriate.     HEENT:  Eyes:  Conjunctivae clear, lids normal.  Pupils equal, symmetrical,  and normally reactive.  Ears/Nose:  Hearing is grossly intact to voice.  Internal and external examinations of the ears and nose are unremarkable.  Mouth/Throat:  Surfaces of the pharynx, palate, and tongue are pink, moist,  and without lesions.   NECK:  Supple, nontender, symmetrical, no masses or JVD, trachea midline,  thyroid not enlarged, nodular, or tender.   No nuchal rigidity.  LYMPHATIC:  No cervical or submandibular lymphadenopathy palpated.   RESPIRATORY:  Clear and equal breath sounds.  No respiratory distress,  tachypnea, or accessory muscle use.    CARDIOVASCULAR:   Heart regular, without murmurs, gallops, rubs, or thrills.       CHEST:  Chest symmetrical without masses or tenderness.   ABDOMEN:  Soft, nondistended.  She is tender to palpation left upper quadrant.  No peritoneal findings.  SKIN:  Warm and dry without rashes.   MUSCULOSKELETAL:  Stance and gait appear normal.      INITIAL ASSESSMENT AND MANAGEMENT PLAN:  This is a 28 year old female who presents with nausea, vomiting, abdominal  pain.  The patient is diabetic.  We will get basic labs to ensure  patient is  not in DKA, we will give antiemetic, fluid and check urine.    CONTINUATION BY Orma FlamingOBERT Haisley Arens, MD     INITIAL ASSESSMENT AND MANAGEMENT PLAN:  This is a new problem for this patient.  Diagnostic studies were obtained.     DIAGNOSTIC STUDIES:  Urinalysis shows moderate blood, 80 ketones, otherwise unremarkable.  Pregnancy test was negative.  CBC was normal.  Chemistry and metabolic panel:  Sodium 133, potassium normal, chloride normal.  Carbon dioxide 19, glucose  358, creatinine normal, BUN normal.  Lipase was normal.     EMERGENCY DEPARTMENT COURSE:  The patient was hemodynamically stable, was somewhat tachycardic  throughout  her stay in the Emergency Department.  She was given 2 liters of IV normal  saline and diphenhydramine and Reglan for her discomfort.  She is feeling  somewhat better now, but seems to be tachycardic.  I have spoken to Dr. Tasia CatchingsAhmed.  We are going to admit under observation status to correct her sugar and her  ketosis.     CLINICAL IMPRESSION AND DIAGNOSES:  1.  Dehydration.  2.  Abdominal pain.  3.  Hyperglycemia.    DISPOSITION AND PLAN:  Admitted to observation status, with Dr. Tasia CatchingsAhmed.      ___________________  Posey Prontoobert E Advika Mclelland MD  Dictated By: Shireen QuanAdrienne L. Johnson, PA-C    My signature above authenticates this document and my orders, the final  diagnosis (es), discharge prescription (s), and instructions in the [facility]  Pulsecheck record.  Nursing notes have been reviewed by the physician/mid-level provider.    If you have any questions please contact [phone].    [facility]  D:12/13/2013 12:57:20  T: 12/13/2013 16:21:40  [id number]  Electronically Authenticated by:  Posey Prontoobert E. Zayda Angell, M.D. On 12/19/2013 01:45 PM EST

## 2013-12-14 LAB — CBC WITH AUTOMATED DIFF
ATYPICAL LYMPHS: 1 % — ABNORMAL HIGH (ref 0–0)
BASOPHILS: 1 % (ref 0–3)
EOSINOPHILS: 1 % (ref 0–5)
HCT: 33.4 % — ABNORMAL LOW (ref 37.0–50.0)
HGB: 11.3 gm/dl — ABNORMAL LOW (ref 13.0–17.2)
LYMPHOCYTES: 28.6 % (ref 28–48)
MCH: 29.7 pg (ref 25.4–34.6)
MCHC: 33.8 gm/dl (ref 30.0–36.0)
MCV: 87.7 fL (ref 80.0–98.0)
MONOCYTES: 1 % (ref 1–13)
MPV: 10.7 fL — ABNORMAL HIGH (ref 6.0–10.0)
NEUTROPHILS: 67.6 % — ABNORMAL HIGH (ref 34–64)
PLATELET COMMENTS: NORMAL
PLATELET: 245 10*3/uL (ref 140–450)
RBC Morphology: NORMAL
RBC: 3.81 M/uL (ref 3.60–5.20)
RDW-SD: 40.5 (ref 36.4–46.3)
WBC: 11.9 10*3/uL — ABNORMAL HIGH (ref 4.0–11.0)

## 2013-12-14 LAB — GLUCOSE, POC
Glucose (POC): 118 mg/dL — ABNORMAL HIGH (ref 65–105)
Glucose (POC): 76 mg/dL (ref 65–105)
Glucose (POC): 51 mg/dL — ABNORMAL LOW (ref 65–105)
Glucose (POC): 163 mg/dL — ABNORMAL HIGH (ref 65–105)
Glucose (POC): 214 mg/dL — ABNORMAL HIGH (ref 65–105)
Glucose (POC): 262 mg/dL — ABNORMAL HIGH (ref 65–105)

## 2013-12-14 LAB — METABOLIC PANEL, COMPREHENSIVE
ALT (SGPT): 13 U/L (ref 12–78)
AST (SGOT): 11 U/L — ABNORMAL LOW (ref 15–37)
Albumin: 3.2 gm/dl — ABNORMAL LOW (ref 3.4–5.0)
Alk. phosphatase: 86 U/L (ref 45–117)
BUN: 7 mg/dl (ref 7–25)
Bilirubin, total: 0.6 mg/dl (ref 0.2–1.0)
CO2: 20 mEq/L — ABNORMAL LOW (ref 21–32)
Calcium: 8 mg/dl — ABNORMAL LOW (ref 8.5–10.1)
Chloride: 109 mEq/L — ABNORMAL HIGH (ref 98–107)
Creatinine: 0.9 mg/dl (ref 0.6–1.3)
GFR est AA: >60
GFR est non-AA: >60
Glucose: 45 mg/dl — CL (ref 74–106)
Potassium: 3.6 mEq/L (ref 3.5–5.1)
Protein, total: 6.7 gm/dl (ref 6.4–8.2)
Sodium: 138 mEq/L (ref 136–145)

## 2013-12-14 LAB — LIPID PANEL
CHOL/HDL Ratio: 3.6 Ratio (ref 0.0–4.4)
Cholesterol, total: 160 mg/dl (ref 140–199)
HDL Cholesterol: 44 mg/dl (ref 40–96)
LDL, calculated: 105 mg/dl (ref 0–130)
Triglyceride: 56 mg/dl (ref 29–150)

## 2013-12-14 LAB — TSH 3RD GENERATION: TSH: 1.75 u[IU]/mL (ref 0.358–3.740)

## 2013-12-14 LAB — T4, FREE: Free T4: 1.28 ng/dl (ref 0.76–1.46)

## 2013-12-14 LAB — MAGNESIUM: Magnesium: 1.7 mg/dl — ABNORMAL LOW (ref 1.8–2.4)

## 2013-12-14 LAB — PHOSPHORUS: Phosphorus: 2.3 mg/dl — ABNORMAL LOW (ref 2.5–4.9)

## 2013-12-21 LAB — METABOLIC PANEL, COMPREHENSIVE
ALT (SGPT): 17 U/L (ref 12–78)
AST (SGOT): 9 U/L — ABNORMAL LOW (ref 15–37)
Albumin: 3.7 gm/dl (ref 3.4–5.0)
Alk. phosphatase: 106 U/L (ref 45–117)
BUN: 10 mg/dl (ref 7–25)
Bilirubin, total: 0.3 mg/dl (ref 0.2–1.0)
CO2: 23 mEq/L (ref 21–32)
Calcium: 9 mg/dl (ref 8.5–10.1)
Chloride: 103 mEq/L (ref 98–107)
Creatinine: 0.9 mg/dl (ref 0.6–1.3)
GFR est AA: >60
GFR est non-AA: >60
Glucose: 243 mg/dl — ABNORMAL HIGH (ref 74–106)
Potassium: 3.9 mEq/L (ref 3.5–5.1)
Protein, total: 8.2 gm/dl (ref 6.4–8.2)
Sodium: 137 mEq/L (ref 136–145)

## 2013-12-21 LAB — CBC WITH AUTOMATED DIFF
BASOPHILS: 0.4 % (ref 0–3)
EOSINOPHILS: 0 % (ref 0–5)
HCT: 39.1 % (ref 37.0–50.0)
HGB: 12.9 gm/dl — ABNORMAL LOW (ref 13.0–17.2)
IMMATURE GRANULOCYTES: 0.3 % (ref 0.0–3.0)
LYMPHOCYTES: 14.7 % — ABNORMAL LOW (ref 28–48)
MCH: 29.6 pg (ref 25.4–34.6)
MCHC: 33 gm/dl (ref 30.0–36.0)
MCV: 89.7 fL (ref 80.0–98.0)
MONOCYTES: 3.2 % (ref 1–13)
MPV: 11.8 fL — ABNORMAL HIGH (ref 6.0–10.0)
NEUTROPHILS: 81.4 % — ABNORMAL HIGH (ref 34–64)
NRBC: 0 (ref 0–0)
PLATELET: 347 10*3/uL (ref 140–450)
RBC: 4.36 M/uL (ref 3.60–5.20)
RDW-SD: 41.9 (ref 36.4–46.3)
WBC: 12 10*3/uL — ABNORMAL HIGH (ref 4.0–11.0)

## 2013-12-21 LAB — LIPASE: Lipase: 135 U/L (ref 73–393)

## 2013-12-21 LAB — LACTIC ACID: Lactic Acid: 1.6 mmol/L (ref 0.4–2.0)

## 2013-12-22 NOTE — ED Provider Notes (Addendum)
[facility]  EMERGENCY [facility] TREATMENT REPORT  NAME:  Lamar SprinklesMOORE, [name]  SEX:   F  ADMIT: 12/21/2013  DOB:   October 16, 1985  MR#    [id number]  ROOM:    TIME DICTATED: 09 38 PM  ACCT#  [id number]        The patient seen and treated 12/21/13 and evaluated on arrival via private  vehicle.    CHIEF COMPLAINT:  Abdominal pain, vomiting.    HISTORY OF PRESENT ILLNESS:  The patient is a 28 year old lady who states that she was awakened this  morning at 9 a.m. by her usual abdominal pain.  The patient has issues of  gastroparesis and frequently has abdominal pain.  She sees endocrinologist,  Dr. Martha ClanMagoon and the primary care physician is Dr. Dion BodyZhao.  She denies any fever,  chills or diarrhea.  She has vomiting secondary to nausea due to pain.  She  tried to take her Phenergan at home and was unsuccessful in being able to keep  it down.  The patient was in her normal state of health and felt fine   yesterday.    REVIEW OF SYSTEMS:  CONSTITUTIONAL:  No fever, chills, or weight loss.  EYES:   No visual symptoms.  ENT:  No sore throat, runny nose, or other URI symptoms.  ENDOCRINE:  No diabetic symptoms.  HEMATOLOGIC/LYMPHATIC:   No excessive bruising or lymph node swelling.  ALLERGIC/IMMUNOLOGIC:  No urticaria or allergy symptoms.  RESPIRATORY:  No cough, shortness of breath, or wheezing.  CARDIOVASCULAR:  No chest pain, chest pressure, or palpitations.  GASTROINTESTINAL:  Positive for vomiting and intermittent abdominal pain and  no diarrhea.  GENITOURINARY:  No dysuria, frequency, or urgency.  MUSCULOSKELETAL:  No joint pain or swelling.  INTEGUMENTARY:  No rashes.  NEUROLOGICAL:  No headaches, sensory or motor symptoms.  PSYCHIATRIC:  No suicidal or homicidal ideation.    PAST MEDICAL HISTORY:  Diabetes, hypertension and gastroparesis.    PAST SURGICAL HISTORY:  C-section and a wrist surgery.    SOCIAL HISTORY:  Negative for tobacco, alcohol and recreational drugs.    FAMILY HISTORY:  Positive for hypertension.     PHYSICAL EXAMINATION:  VITAL SIGNS:  On my assessment, blood pressure 170/116, heart rate 113,  respiratory 18, O2 sat 99 on room air, her temperature is 98.1 and her pain is  8 out of 10 and is localized really in the left upper quadrant.  GENERAL APPEARANCE:  The patient well developed and adequately nourished.  EYES:  Conjunctivae clear, lids are normal.  Pupils are equal and normally  reactive.    ENT:  Hearing intact.  External examination is normal.  Surfaces of pharynx  and palate are pink and free of lesion.  NECK:  Supple.  There is no JVD.  Trachea is midline.  RESPIRATORY:  Clear and equal breath sounds.  No respiratory distress,  tachypnea, or accessory muscle use.    CARDIOVASCULAR:  The patient is tachycardic without murmur, gallop, rub or  thrill I can appreciate.  She has no peripheral edema.  She actually has  decreased skin turgor.  CHEST:  Symmetrical without masses or tenderness.  GASTROINTESTINAL:  Abdomen is soft.  She has minimal complaints of pain to  palpation in left upper quadrant.  No hepato- or splenomegaly is noted.  GENITOURINARY:  Initially deferred.  MUSCULOSKELETAL:  Clubbing and deformities not noted.  Nailbeds are pink with  adequate refill.  SKIN:  Warm and dry and free of rash and  has decreased turgor as stated above.  NEUROLOGIC:  She is alert and oriented with no facial asymmetry or dysarthria.  She has intact motor strength and sensation.  PSYCHIATRIC:  The patient appears moderately anxious secondary to vomiting and  not feeling well.  Oriented to time, place and person.  Otherwise, her mood  and affect is appropriate.    INITIAL ASSESSMENT AND MANAGEMENT PLAN:  The patient is a 28 year old lady well known to us for frequent visits for  acute on chronic abdominal pain secondary to gastroparesis.  Last I saw this  patient she was admitted for DKA.  We will go ahead and evaluate her for same  as that is certainly a possibility with her presentation and with her medical   history.  The patient will be hydrated aggressively.  We will address her pain  and nausea.  The patient actively retching when I am in the room on initial  evaluation.    DIAGNOSTIC STUDIES:  The patient's CBC shows a white blood cell count of 12 with a minimal left  chest.  Three-view of abdomen for obstruction or ileus shows constipation as  there is a large amount of fecal material throughout the colon and has a  nonobstructive gas pattern.  Comp metabolic shows a glucose of 243.  Her CO2  is normal as are her other electrolytes.  Lipase is 135.  Lactic acid 1.6.    COURSE IN EMERGENCY DEPARTMENT:  The patient hydrated with 250 mL an hour of normal saline pending her  laboratory studies, she was given 1 mg of Dilaudid and 25 mg of Phenergan IV  med infusion and was reevaluated only to find her sleeping.  The patient slept  for approximately 2-1/2 hours and was reassessed and stated that she still had  significant pain and did not want to do a p.o. challenge for nursing.  I  brought her a cup of ice and asked her to please do her best with it.  She was  given another 0.5 mg of Dilaudid, 20 mg of Pepcid and 25 of Benadryl as this  cocktail seemed to have helped her on previous occasions.  The patient  continued to be tachycardic even after this administration; therefore, was  given another liter of normal saline.  The patient reassessed and offered an  enema secondary to her full colon which she declined and stated that she would  rather do at home.  The patient has ambulated to the bathroom to void.  She  stated that she had 1 vomitus episode while here but no one saw it.  The  patient has not vomited since my initial evaluation that we can document.  I  feel the patient is safe to go home.  Her electrolytes are completely normal.  I do not believe that she needs admission for intractable vomiting as she has  not had any other episodes since originally medicated here with Phenergan.     CLINICAL IMPRESSION AND DIAGNOSIS:  1.  Vomiting.  2.  Constipation.    DISPOSITION:  The patient discharged home in improved and stable condition with a  prescription for Zofran ODT to use for nausea when she cannot keep her  Phenergan down.      ___________________  Liberty HandyJennifer H Himmel-Brianah Hopson DO  Dictated By: Marland Kitchen.     My signature above authenticates this document and my orders, the final  diagnosis (es), discharge prescription (s), and instructions in the [facility]  Pulsecheck record.  Nursing  notes have been reviewed by the physician/mid-level provider.    If you have any questions please contact [phone].    RA  D:12/21/2013 21:38:11  T: 12/22/2013 05:11:01  [id number]  Electronically Authenticated by:  Carolin Guernsey, DO On 12/23/2013 02:10 AM EST

## 2013-12-23 LAB — METABOLIC PANEL, COMPREHENSIVE
ALT (SGPT): 14 U/L (ref 12–78)
AST (SGOT): 5 U/L — ABNORMAL LOW (ref 15–37)
Albumin: 3.5 gm/dl (ref 3.4–5.0)
Alk. phosphatase: 95 U/L (ref 45–117)
BUN: 13 mg/dl (ref 7–25)
Bilirubin, total: 0.2 mg/dl (ref 0.2–1.0)
CO2: 23 mEq/L (ref 21–32)
Calcium: 8.3 mg/dl — ABNORMAL LOW (ref 8.5–10.1)
Chloride: 108 mEq/L — ABNORMAL HIGH (ref 98–107)
Creatinine: 1 mg/dl (ref 0.6–1.3)
GFR est AA: >60
GFR est non-AA: >60
Glucose: 134 mg/dl — ABNORMAL HIGH (ref 74–106)
Potassium: 3.2 mEq/L — ABNORMAL LOW (ref 3.5–5.1)
Protein, total: 7.3 gm/dl (ref 6.4–8.2)
Sodium: 140 mEq/L (ref 136–145)

## 2013-12-23 LAB — CBC WITH AUTOMATED DIFF
BASOPHILS: 0.6 % (ref 0–3)
EOSINOPHILS: 0 % (ref 0–5)
HCT: 34.6 % — ABNORMAL LOW (ref 37.0–50.0)
HGB: 11.6 gm/dl — ABNORMAL LOW (ref 13.0–17.2)
IMMATURE GRANULOCYTES: 0.3 % (ref 0.0–3.0)
LYMPHOCYTES: 19.9 % — ABNORMAL LOW (ref 28–48)
MCH: 29.9 pg (ref 25.4–34.6)
MCHC: 33.5 gm/dl (ref 30.0–36.0)
MCV: 89.2 fL (ref 80.0–98.0)
MONOCYTES: 4.3 % (ref 1–13)
MPV: 11 fL — ABNORMAL HIGH (ref 6.0–10.0)
NEUTROPHILS: 74.9 % — ABNORMAL HIGH (ref 34–64)
NRBC: 0 (ref 0–0)
PLATELET: 326 10*3/uL (ref 140–450)
RBC: 3.88 M/uL (ref 3.60–5.20)
RDW-SD: 41.7 (ref 36.4–46.3)
WBC: 10.5 10*3/uL (ref 4.0–11.0)

## 2013-12-23 LAB — POC URINE MACROSCOPIC
Bilirubin: NEGATIVE
Blood: NEGATIVE
Glucose: >=1000 mg/dl — AB
Ketone: NEGATIVE mg/dl
Leukocyte Esterase: NEGATIVE
Nitrites: NEGATIVE
Protein: 100 mg/dl — AB
Specific gravity: 1.025 (ref 1.005–1.030)
Urobilinogen: 1 EU/dl (ref 0.0–1.0)
pH (UA): 6 (ref 5–9)

## 2013-12-23 LAB — LIPASE: Lipase: 110 U/L (ref 73–393)

## 2013-12-23 LAB — POC HCG,URINE: HCG urine, QL: NEGATIVE

## 2013-12-23 NOTE — ED Provider Notes (Addendum)
[facility]  EMERGENCY DEPARTMENT TREATMENT REPORT  NAME:  [name] [name], [name] [name]  SEX:   F  ADMIT: 12/23/2013  DOB:   [birth year]  MR#    [id number]  ROOM:    TIME DICTATED: 01 16 PM  ACCT#  [id number]    cc: Charlyn MinervaA Zhang MD    TIME OF EVALUATION:  1056.    PRIMARY CARE PROVIDER[facility]  [facility].    CHIEF COMPLAINT:  Abdominal pain, vomiting.    HISTORY OF PRESENT ILLNESS:  This is a 28 year old female with past medical history significant for  diabetes, gastroparesis.  The patient presenting to the Emergency Department  today secondary to abdominal pain and vomiting.  The patient states that she  has had pain present for 4 days, described as cramping.  She was seen in our  Emergency Department a couple days ago.  She was told that symptoms may be  secondary to constipation.  She has since drank magnesium citrate, used an  enema, but pain has persisted.  Her vomiting had actually improved when she  left the ER 2 days ago, but it returned today, and she has had already 9  episodes of vomiting this morning.  Denies any hematemesis, but due to  worsening pain and vomiting, here for further evaluation.    REVIEW OF SYSTEMS:  CONSTITUTIONAL:  Denies fever.  EYES:   No visual symptoms.  ENT:  No sore throat, runny nose, or other URI symptoms.  RESPIRATORY:  No shortness of breath.  CARDIOVASCULAR:  No chest pain.  GASTROINTESTINAL:  Positive for abdominal pain and vomiting, no diarrhea.  GENITOURINARY:  Denies dysuria.  MUSCULOSKELETAL:  Denies joint pain.  INTEGUMENTARY:  No rashes.  NEUROLOGIC:  No headache.    PAST MEDICAL HISTORY:  Diabetes, gastroparesis, hypertension, C-section.    SOCIAL HISTORY:  Nonsmoker.    FAMILY HISTORY:  Denies diabetes in the family.    MEDICATIONS:  Reviewed.    ALLERGIES:  REVIEWED.    PHYSICAL EXAMINATION:  VITAL SIGNS:  Blood pressure 168/109, pulse 108, respirations 18, temperature  98.2, pain 8 out of 10, O2 saturation 100% on room air.   GENERAL:  The patient well nourished, well developed, answering questions  appropriately, uncomfortable secondary to pain, but in no acute distress.  HEENT:  Mouth:  Mucous membranes are somewhat dry.    RESPIRATORY:  Clear and equal breath sounds.  No respiratory distress,  tachypnea, or accessory muscle use.      CARDIOVASCULAR:   Heart regular, without murmurs, gallops, rubs, or thrills.       GASTROINTESTINAL:  Normoactive bowel sounds.  Abdomen is soft, diffusely  tender, but seems to be greatest in the epigastric region.  No rebound or  guarding is appreciated.  MUSCULOSKELETAL:  No localized midline tenderness to the back, no flank pain.  Moving  all extremities.  SKIN:  No rashes.  NEUROLOGIC:  Alert, oriented.  Sensation intact, motor strength equal and  symmetric.     CONTINUATION BY Kristeen MansBRITTANY IRWIN, PA    INITIAL ASSESSMENT AND MANAGEMENT PLAN:   A 28 year old female here secondary to vomiting, has history of diabetic  gastroparesis.  We will obtain labs.    DIAGNOSTIC INTERPRETATIONS:  CBC unremarkable, normal white count 10.5, 75% segs, H&H 11.6 and 34.6  respectively.  Urine negative for infection, negative for pregnancy. Lipase  unremarkable.  CMP revealed a potassium of 3.2, chloride 108, glucose 134 with  a normal bicarbonate of 23, but otherwise unremarkable.  EMERGENCY DEPARTMENT COURSE:   The patient stable while here in the Emergency Department, given a liter of IV  fluids for hydration.  Medicated for pain and nausea with IV Benadryl, IV  Reglan, IV Zofran, IV Ativan.  After medications give the patient seemed to be  resting comfortably. Oral potassium was ordered here for the patient; however,  the patient refused to take this.  She requested prescription to go home with  instead. I did go ahead and write this prescription for her.  The patient  states that she has plenty of antibiotics at home including Reglan and will  not require any kind of other.  She is advised to follow up with her  doctor  and return for any new or worsening symptoms.    CLINICAL IMPRESSION AND DIAGNOSIS:  Gastroparesis exacerbation.    DISPOSITION AND PLAN:  The patient discharged home with instructions above.     The patient was personally evaluated by myself and Dr. Jimmey RalphParker who agrees with  the above assessment and plan.      ___________________  Gwenyth Allegraimothy S Kennadie Brenner MD  Dictated By: Kristeen MansBrittany Irwin, PA    My signature above authenticates this document and my orders, the final  diagnosis (es), discharge prescription (s), and instructions in the [facility]  Pulsecheck record.  Nursing notes have been reviewed by the physician/mid-level provider.    If you have any questions please contact [phone].    LH  D:12/23/2013 13:16:17  T: 12/23/2013 13:49:45  [id number]  Electronically Authenticated by:  Gwenyth Allegraimothy S. Caitland Porchia, M.D. On 01/09/2014 07:52 PM EST

## 2013-12-24 LAB — POC URINE MACROSCOPIC
Bilirubin: NEGATIVE
Blood: NEGATIVE
Glucose: NEGATIVE mg/dl
Ketone: NEGATIVE mg/dl
Leukocyte Esterase: NEGATIVE
Nitrites: NEGATIVE
Protein: 30 mg/dl — AB
Specific gravity: 1.02 (ref 1.005–1.030)
Urobilinogen: 1 EU/dl (ref 0.0–1.0)
pH (UA): 7.5 (ref 5–9)

## 2013-12-24 LAB — POC CHEM8
BUN: 8 mg/dl (ref 7–25)
CALCIUM,IONIZED: 4.8 mg/dL (ref 4.40–5.40)
CO2, TOTAL: 22 mmol/L (ref 21–32)
Chloride: 103 mEq/L (ref 98–107)
Creatinine: 0.7 mg/dl (ref 0.6–1.3)
Glucose: 77 mg/dL (ref 74–106)
HCT: 35 % — ABNORMAL LOW (ref 38–45)
HGB: 11.9 gm/dl — ABNORMAL LOW (ref 13.0–17.2)
Potassium: 3.4 mEq/L — ABNORMAL LOW (ref 3.5–4.9)
Sodium: 140 mEq/L (ref 136–145)

## 2013-12-24 NOTE — ED Provider Notes (Addendum)
[facility]  [facility] TREATMENT REPORT  NAME:  [name] [name], [name]  SEX:   F  ADMIT: 12/23/2013  DOB:   July 20, 1985  MR#    [id number]  ROOM:    TIME DICTATED: 05 55 AM  ACCT#  [id number]    cc: Jerral BonitoWei Zhao MD    PRIMARY CARE PHYSICIAN:  Dr. Jerral BonitoWei Zhao.    CHIEF COMPLAINT:    Abdominal pain, vomiting.    HISTORY OF PRESENT ILLNESS:  This is a 28 year old female returning to the Emergency Department with  complaints of vomiting, abdominal pain.  She was just discharged from the  Emergency Department several hours ago after treatment of similar symptoms.  She states since arriving home, she continues vomiting, has had at least 5  episodes of emesis and is experiencing diffuse abdominal cramping pain.  No  history of fevers or chills.  She has not been able to take her Phenergan due  to the vomiting.  She denies any diarrhea.  States she is actually feeling  very constipated, has not had a bowel movement in over a week and she returns  at this time for further management.    REVIEW OF SYSTEMS:  CONSTITUTIONAL:  No fevers.  RESPIRATORY: No difficulty breathing.  CARDIOVASCULAR:  No chest pain.  GASTROINTESTINAL:  As above.  GENITOURINARY:  No dysuria.  MUSCULOSKELETAL:  No extremity pain.    PAST MEDICAL HISTORY:  Gastroparesis, diabetes, hypertension, chronic abdominal pain.    SOCIAL HISTORY:  Nonsmoker.    MEDICATIONS:  Reviewed in Ibex.    ALLERGIES:  REVIEWED IN IBEX.    PHYSICAL EXAMINATION:  VITAL SIGNS:  Blood pressure is 183/108, pulse 113, respirations 18,  temperature 98.2, O2 saturation 100% on room air. Pain rated 9/10.  GENERAL APPEARANCE:  Patient appears well developed and well nourished.  Appearance and behavior are age and situation appropriate. She is lying  comfortably on stretcher, nontoxic appearing.  HEENT:  Head:  NC/AT.  Conjunctivae are clear, nonicteric.  Mouth and throat:  Oral mucosa is pink and dry.  RESPIRATORY:  Clear and equal breath sounds throughout all lung fields.  The   patient is not tachypneic.  No respiratory distress.  CARDIOVASCULAR:  Tachycardic, regular rhythm.  No murmurs.  ABDOMEN:  Normoactive bowel sounds in all four quadrants.  Abdomen is soft,  does not appear distended.  There is diffuse discomfort with palpation but no  significant isolated areas of tenderness.  There is no rebound or guarding.  MUSCULOSKELETAL:  Stance and gait appear normal.   SKIN:  Warm and dry without rashes.     INITIAL ASSESSMENT AND MANAGEMENT PLAN:  The patient presenting due to worsening of vomiting, abdominal pain.  She was  seen here several hours ago, discharged home.  She has not been able to  tolerate anything by mouth and feels worse.  She does have a history of  gastroparesis, considered this to be a flare of her condition.  She is also  diabetic, concern for possible DKA Today we will obtain a urinalysis and  i-STAT will hydrate the patient and treat symptomatically.  Disposition will  be based on clinical course.    DIAGNOSTIC STUDIES:  Urinalysis with 30 protein, otherwise unremarkable.  The i-STAT chem 8 with a  potassium of 3.4, otherwise unremarkable.    COURSE IN THE EMERGENCY DEPARTMENT:  The patient remained stable throughout her stay.  Results of diagnostics were  reviewed with the patient and she was initially given a  1 liter normal saline  bolus with 10 mg of IV Reglan and 1 mg of IV Ativan.  At time of reassessment,  she seemed to be improved but was still feeling nauseated; therefore, she was  given 25 mg of IV Phenergan.  When I went to reexamine the patient sometime  later she was sleeping comfortably on the stretcher.  She was easily  arousable.  She has had no further episodes of vomiting while here in the ED.  She was given an additional 1 liter of normal saline for hydration.  She was  given ice chips stated that she was feeling better.  She was offered admission  to the hospital due to the persistent nausea; however, at this time she   requested to go home, stating that she was feeling better and did not want to  be admitted.  At this point, I believe this is reasonable, her tachycardia has  improved.  She has no ketones in her urine  and does not appear to be in DKA.  We will send her home with a prescription for Phenergan suppositories and  advised that she follow up with her PCP.  Recheck of vital signs prior to  discharge home blood pressure 142/74, pulse 85, respirations 18, O2 saturation  100% on room air.  Additionally, the patient did complain of some cramping  abdominal pain.  I suspect this could be related to constipation.  She states  she has taken magnesium citrate and is not interested in performing an enema.  We will offer her GoLYTELY bowel prep to get the stool moving.  At this point  I have a low suspicion for acute bowel obstruction.  She had an abdominal  series x-ray performed 2 days ago. This was unremarkable.  There is no sign of  obstruction at that point, but did note the presence of constipation.  Again,  I do suspect the constipation to likely be the source of the patient's  abdominal pain.  At this time, I believe she is able to be discharged home.    CLINICAL IMPRESSION AND DIAGNOSES:  1.  Acute vomiting.  2.  Constipation.    DISPOSITION AND PLAN:  The patient discharged home in stable condition.  Instructed to follow up with  PCP.  She is given prescription for Phenergan rectal suppositories as well as  GoLYTELY bowel prep solution and advised that she may return at anytime for  any worsening or symptoms of concern.  The patient was personally evaluated by  myself and Dr. Wenda OverlandHimmel-Becker Christopher who agrees with the above assessment and plan.      ___________________  Liberty HandyJennifer H Himmel-Callee Rohrig DO  Dictated By: Zachary GeorgeMichelle M. Pernell DupreAdams, PA-C    My signature above authenticates this document and my orders, the final  diagnosis (es), discharge prescription (s), and instructions in the [facility]  Pulsecheck record.   Nursing notes have been reviewed by the physician/mid-level provider.    If you have any questions please contact [phone].    FS  D:12/24/2013 05:55:35  T: 12/24/2013 11:43:47  [id number]  Electronically Authenticated by:  Carolin GuernseyJENNIFER HIMMEL-Neldon Shepard, DO On 12/27/2013 03:36 PM EST

## 2013-12-25 LAB — CBC WITH AUTOMATED DIFF
BASOPHILS: 0.3 % (ref 0–3)
EOSINOPHILS: 0 % (ref 0–5)
HCT: 35 % — ABNORMAL LOW (ref 37.0–50.0)
HGB: 11.8 gm/dl — ABNORMAL LOW (ref 13.0–17.2)
IMMATURE GRANULOCYTES: 0.4 % (ref 0.0–3.0)
LYMPHOCYTES: 19.1 % — ABNORMAL LOW (ref 28–48)
MCH: 29.6 pg (ref 25.4–34.6)
MCHC: 33.7 gm/dl (ref 30.0–36.0)
MCV: 87.9 fL (ref 80.0–98.0)
MONOCYTES: 3.5 % (ref 1–13)
MPV: 10.8 fL — ABNORMAL HIGH (ref 6.0–10.0)
NEUTROPHILS: 76.7 % — ABNORMAL HIGH (ref 34–64)
NRBC: 0 (ref 0–0)
PLATELET: 299 10*3/uL (ref 140–450)
RBC: 3.98 M/uL (ref 3.60–5.20)
RDW-SD: 39.8 (ref 36.4–46.3)
WBC: 9.3 10*3/uL (ref 4.0–11.0)

## 2013-12-25 LAB — POC URINE MACROSCOPIC
Bilirubin: NEGATIVE
Blood: NEGATIVE
Glucose: 500 mg/dl — AB
Ketone: 40 mg/dl — AB
Leukocyte Esterase: NEGATIVE
Nitrites: NEGATIVE
Specific gravity: 1.015 (ref 1.005–1.030)
Urobilinogen: 1 EU/dl (ref 0.0–1.0)
pH (UA): 7 (ref 5–9)

## 2013-12-25 LAB — METABOLIC PANEL, COMPREHENSIVE
ALT (SGPT): 14 U/L (ref 12–78)
AST (SGOT): 12 U/L — ABNORMAL LOW (ref 15–37)
Albumin: 3.5 gm/dl (ref 3.4–5.0)
Alk. phosphatase: 87 U/L (ref 45–117)
BUN: 7 mg/dl (ref 7–25)
Bilirubin, total: 0.4 mg/dl (ref 0.2–1.0)
CO2: 22 mEq/L (ref 21–32)
Calcium: 8.3 mg/dl — ABNORMAL LOW (ref 8.5–10.1)
Chloride: 105 mEq/L (ref 98–107)
Creatinine: 0.9 mg/dl (ref 0.6–1.3)
GFR est AA: >60
GFR est non-AA: >60
Glucose: 203 mg/dl — ABNORMAL HIGH (ref 74–106)
Potassium: 3.8 mEq/L (ref 3.5–5.1)
Protein, total: 6.9 gm/dl (ref 6.4–8.2)
Sodium: 138 mEq/L (ref 136–145)

## 2013-12-25 LAB — POC HCG,URINE: HCG urine, QL: NEGATIVE

## 2013-12-25 LAB — LIPASE: Lipase: 92 U/L (ref 73–393)

## 2013-12-25 NOTE — ED Provider Notes (Addendum)
[facility]  EMERGENCY DEPARTMENT TREATMENT REPORT  NAME:  [name] [name], [name] [name]  SEX:   F  ADMIT: 12/25/2013  DOB:   1985/04/13  MR#    [id number]  ROOM:    TIME DICTATED: 01 08 PM  ACCT#  [id number]    cc: Jerral BonitoWei Zhao MD    PRIMARY CARE PHYSICIAN:  Jerral BonitoWei Zhao, MD     DATE AND TIME OF EVALUATION:  Sunday 12/25/2013.    CHIEF COMPLAINT:  Abdominal pain.    HISTORY OF PRESENT ILLNESS:  This 28 year old female presents with 9 out of 10 left upper quadrant  abdominal pain that has been going on for about a week.  She says she also has  not had a bowel movement in 2 weeks.  The patient does have a history of  gastroparesis and diabetes, for which she takes insulin.  She says this feels  somewhat like her gastroparesis.  She has had numerous episodes of vomiting.  She has had no fevers, chills, chest pain or trouble breathing and no urinary  symptoms.  She says she was given something by one of her doctors for her  constipation, but that did not help.  She threw it up.    REVIEW OF SYSTEMS:  CONSTITUTIONAL:  No fever, chills, or weight loss.  EYES:   No visual symptoms.  ENT:  No sore throat, runny nose, or other URI symptoms.  HEMATOLOGIC/LYMPHATIC:   No excessive bruising or lymph node swelling.  RESPIRATORY:  No cough, shortness of breath, or wheezing.  CARDIOVASCULAR:  No chest pain, chest pressure, or palpitations.  GASTROINTESTINAL:  See HPI.  GENITOURINARY:  No dysuria, frequency, or urgency.  MUSCULOSKELETAL:  No joint pain or swelling.  INTEGUMENTARY:  No rashes.  NEUROLOGICAL:  No headaches, sensory or motor symptoms.    PAST MEDICAL HISTORY:  Diabetes, gastroparesis, hypertension.    PAST SURGICAL HISTORY:  C-section.    SOCIAL HISTORY:  The patient does not smoke, abuse drugs or alcohol.    MEDICATIONS:  Reviewed in Ibex.    ALLERGIES:  REVIEWED IN IBEX.    PHYSICAL EXAMINATION:  GENERAL APPEARANCE:  Well-developed, well-nourished female lying on exam table  in no acute distress.   VITAL SIGNS:  Blood pressure is 179/98, pulse is 109, respirations 20,  temperature 98.0, O2 sat 100% on room air.  HEENT:  Eyes:  Conjunctivae clear, lids normal.  Pupils equal, symmetrical,  and normally reactive.  Mouth/Throat:  Surfaces of the pharynx, palate, and  tongue are pink, moist, and without lesions.  RESPIRATORY:  Clear and equal breath sounds.  No respiratory distress,  tachypnea, or accessory muscle use.    CARDIOVASCULAR:   Heart regular, without murmurs, gallops, rubs, or thrills.   GASTROINTESTINAL:  Abdomen is soft and tender to palpation only in the left  upper quadrant.  MUSCULOSKELETAL:  Nails:  No clubbing or deformities.  Nailbeds pink with  prompt capillary refill.  SKIN:  Warm and dry without rashes.  NEUROLOGIC:  Alert, oriented.  Sensation intact, motor strength equal and  symmetric.    CONTINUED BY SARAH GREGORY, PA-C:     ASSESSMENT AND PLAN:    This 28 year old female with history of diabetes and gastroparesis, presents  with left upper quadrant abdominal pain that feels somewhat like her  gastroparesis, vomiting and constipation.  My abdominal exam on her; abdomen  is very soft and nontender.  Will check abdominal labs, urine, urine  pregnancy.  I am going to do a complete abdominal  series with chest given her  history of this constipation.  I am going to give her Benadryl and Reglan for  the abdominal discomfort associated with this gastroparesis and a liter of  fluids for hydration.    DIAGNOSTIC TEST RESULTS:  CMP:  Sodium 138, potassium 3.8, glucose 203, BUN and creatinine were 7 and  0.9.  AST, ALT and alkaline phosphatase were 1214 and 87 respectively with a  total bilirubin of 0.4.  Lipase is 92.  CBC:  White count was 9.3, hemoglobin  and hematocrit 11.8 and 35.  Chest x-ray showed no acute findings and improved  constipation from when she was here 2 days ago.  Urine showed glucose, ketones  and protein, no blood, leukocytes or nitrites.  Urine pregnancy was negative.     COURSE IN THE EMERGENCY DEPARTMENT:  The patient remained well appearing.  She was found sleeping comfortably after  the first round of medicine, but she was still having pain, so we gave her an  IM injection of Phenergan and 1 mg IV Ativan.  At this time, I do believe the  patient is safe for discharge and we are going to start her on MiraLax.  She  apparently had gotten GoLYTELY when she was here 2 days ago and she said she  drank it but then threw a lot of it up.  Given her improved constipation on  x-ray, I do believe that that may have helped her some and will continue that  trend with the MiraLax.    DIAGNOSES:  1.  Acute gastroparesis exacerbation.  2.  Constipation, improving.  3.  History of insulin-dependent diabetes.    DISPOSITION:  The patient dispositioned home in stable condition with a prescription for  MiraLax.  Follow up with primary and return to the ED for any new or worsening  symptoms.  The patient was personally evaluated by myself and Dr. Delton SeeNelson who  agrees with the above assessment and plan.      ___________________  Gwenyth Allegraimothy S Nekia Maxham MD  Dictated By: Thea SilversmithSarah Gregory, PA-C    My signature above authenticates this document and my orders, the final  diagnosis (es), discharge prescription (s), and instructions in the [facility]  Pul[facility]ck record.  Nursing notes have been reviewed by the physician/mid-level provider.    If you have any questions please contact [phone].    JB  D:12/25/2013 13:08:16  T: 12/25/2013 13:40:33  [id number]  Electronically Authenticated by:  Gwenyth Allegraimothy S. Elsa Malena, M.D. On 01/16/2014 07:41 AM EST

## 2013-12-27 LAB — HEMOGLOBIN A1C WITH EAG: Hemoglobin A1c: 8.8 % — ABNORMAL HIGH (ref 4.8–6.0)

## 2013-12-27 LAB — CBC WITH AUTOMATED DIFF
BASOPHILS: 0.4 % (ref 0–3)
EOSINOPHILS: 0.1 % (ref 0–5)
HCT: 32.4 % — ABNORMAL LOW (ref 37.0–50.0)
HGB: 10.9 gm/dl — ABNORMAL LOW (ref 13.0–17.2)
IMMATURE GRANULOCYTES: 0.2 % (ref 0.0–3.0)
LYMPHOCYTES: 21.9 % — ABNORMAL LOW (ref 28–48)
MCH: 30.3 pg (ref 25.4–34.6)
MCHC: 33.6 gm/dl (ref 30.0–36.0)
MCV: 90 fL (ref 80.0–98.0)
MONOCYTES: 3.7 % (ref 1–13)
MPV: 11.6 fL — ABNORMAL HIGH (ref 6.0–10.0)
NEUTROPHILS: 73.7 % — ABNORMAL HIGH (ref 34–64)
NRBC: 0 (ref 0–0)
PLATELET: 281 10*3/uL (ref 140–450)
RBC: 3.6 M/uL (ref 3.60–5.20)
RDW-SD: 39.5 (ref 36.4–46.3)
WBC: 10.3 10*3/uL (ref 4.0–11.0)

## 2013-12-27 LAB — POC URINE MACROSCOPIC
Blood: NEGATIVE
Glucose: 500 mg/dl — AB
Ketone: >=160 mg/dl — AB
Leukocyte Esterase: NEGATIVE
Nitrites: NEGATIVE
Protein: 100 mg/dl — AB
Specific gravity: 1.025 (ref 1.005–1.030)
Urobilinogen: 4 EU/dl — ABNORMAL HIGH (ref 0.0–1.0)
pH (UA): 6.5 (ref 5–9)

## 2013-12-27 LAB — METABOLIC PANEL, COMPREHENSIVE
ALT (SGPT): 15 U/L (ref 12–78)
AST (SGOT): 25 U/L (ref 15–37)
Albumin: 3.3 gm/dl — ABNORMAL LOW (ref 3.4–5.0)
Alk. phosphatase: 82 U/L (ref 45–117)
BUN: 11 mg/dl (ref 7–25)
Bilirubin, total: 0.8 mg/dl (ref 0.2–1.0)
CO2: 23 mEq/L (ref 21–32)
Calcium: 7.9 mg/dl — ABNORMAL LOW (ref 8.5–10.1)
Chloride: 105 mEq/L (ref 98–107)
Creatinine: 1 mg/dl (ref 0.6–1.3)
GFR est AA: >60
GFR est non-AA: >60
Glucose: 257 mg/dl — ABNORMAL HIGH (ref 74–106)
Potassium: 4.2 mEq/L (ref 3.5–5.1)
Protein, total: 6.8 gm/dl (ref 6.4–8.2)
Sodium: 137 mEq/L (ref 136–145)

## 2013-12-27 LAB — GLUCOSE, POC
Glucose (POC): 267 mg/dL — ABNORMAL HIGH (ref 65–105)
Glucose (POC): 247 mg/dL — ABNORMAL HIGH (ref 65–105)
Glucose (POC): 240 mg/dL — ABNORMAL HIGH (ref 65–105)
Glucose (POC): 97 mg/dL (ref 65–105)

## 2013-12-27 LAB — POC BLOOD GAS + LACTIC ACID
BASE EXCESS: -6 mmol/L — ABNORMAL LOW (ref <=3)
BICARBONATE: 19.2 mmol/L — ABNORMAL LOW (ref 22.0–26.0)
CO2, TOTAL: 20 mmol/L — ABNORMAL LOW (ref 21–32)
Lactic Acid: 0.92 mmol/L (ref 0.40–2.00)
O2 SAT: 79 % — ABNORMAL HIGH (ref 70–75)
PCO2: 32.6 mm Hg — ABNORMAL LOW (ref 41.0–51.0)
PO2: 44 mm Hg — ABNORMAL HIGH (ref 35–40)
pH: 7.377 (ref 7.310–7.410)

## 2013-12-27 LAB — POC CHEM8
BUN: 10 mg/dl (ref 7–25)
CALCIUM,IONIZED: 4.8 mg/dL (ref 4.40–5.40)
CO2, TOTAL: 19 mmol/L — ABNORMAL LOW (ref 21–32)
Chloride: 100 mEq/L (ref 98–107)
Creatinine: 0.8 mg/dl (ref 0.6–1.3)
Glucose: 291 mg/dL — ABNORMAL HIGH (ref 74–106)
HCT: 35 % — ABNORMAL LOW (ref 38–45)
HGB: 11.9 gm/dl — ABNORMAL LOW (ref 13.0–17.2)
Potassium: 3.8 mEq/L (ref 3.5–4.9)
Sodium: 136 mEq/L (ref 136–145)

## 2013-12-27 LAB — LIPASE: Lipase: 65 U/L — ABNORMAL LOW (ref 73–393)

## 2013-12-27 LAB — POC LACTIC ACID: Lactic Acid: 1.53 mmol/L (ref 0.40–2.00)

## 2013-12-27 NOTE — ED Provider Notes (Addendum)
[facility]  EMERGENCY [facility] TREATMENT REPORT  NAME:  [name] [name], [name] [name]  SEX:   F  ADMIT: 12/27/2013  DOB:   [birth year]  MR#    [id number]  ROOM:  CD09  TIME DICTATED: 06 19 AM  ACCT#  [id number]        I hereby certify this patient for admission based upon medical necessity as  noted below:     CHIEF COMPLAINT:    Abdominal pain with no nausea and vomiting.    HISTORY OF PRESENT ILLNESS:  The patient is a 28 year old female who presents complaining of epigastric  abdominal pain radiating through to her back with associated nausea and  vomiting.  Symptoms started several days ago.  She has been seen here several  times in the ER with similar complaints.  She has been using Zofran and  Phenergan suppositories at home with no improvement of her symptoms.  She has  a history of chronic abdominal pain and gastroparesis, which I suspects  secondary to her diabetes.  States her blood sugars have been running in the  300 range.  Denies any urinary complaints including dysuria, urinary frequency  or hematuria.  Denies chest pain, shortness of breath, difficulty breathing.    REVIEW OF SYSTEMS:  CONSTITUTIONAL:  No fever, chills.  symptoms.  EYES:   No visual symptoms.  ENT:  No sore throat, runny nose, or other URI symptoms.  HEMATOLOGIC:  No bleeding or bruising issues.   ALLERGIC/IMMUNOLOGIC:  No urticaria or allergy symptoms.  RESPIRATORY:  No cough, shortness of breath, or wheezing.  CARDIOVASCULAR:  No chest pain, chest pressure, or palpitations.  GASTROINTESTINAL:  Epigastric pain radiating through to back with nausea and  vomiting.  Denies diarrhea.  GENITOURINARY:  No dysuria, frequency, or urgency.  MUSCULOSKELETAL:  No joint pain or swelling.  NEUROLOGICAL:  No headaches, sensory or motor symptoms.    PAST MEDICAL HISTORY:  History of diabetes, gastroparesis, hypertension, C-section.    CURRENT MEDICATIONS:  Reviewed on Ibex.    ALLERGIES:  PENICILLIN.      SOCIAL HISTORY:   Denies any use of tobacco, alcohol or illicit drugs.    PHYSICAL EXAMINATION:  VITAL SIGNS:  Blood pressure is 179/113, pulse 119, respirations 16,  temperature 99.3 orally, pain 9 out of 10, O2 saturation 100% on room air.  GENERAL APPEARANCE:  The patient appears well developed and nourished.  She is  alert.  HEENT:  Eyes:  Conjunctivae clear, lids normal.  Pupils equal, symmetrical,  and normally reactive.  Mouth/Throat:  Surfaces of the pharynx, palate, and  tongue are pink, moist, and without lesions.  NECK:  Supple, nontender, symmetrical, no masses or JVD, trachea midline,  thyroid not enlarged, nodular, or tender.  LYMPHATICS:  No cervical or submandibular lymphadenopathy palpated.  RESPIRATORY:  Clear and equal breath sounds.  No respiratory distress,  tachypnea, or accessory muscle use.    CARDIOVASCULAR:   Heart regular, without murmurs, gallops, rubs, or thrills.   GASTROINTESTINAL:  Abdomen soft, nondistended, tenderness to palpation in the  epigastric region.  No rebound or guarding.  There is tenderness to palpation.  No abdominal masses appreciated by inspection or palpation.  No CVA tenderness  noted bilaterally.  SKIN:  Warm and dry without rashes.  NEUROLOGIC:  The patient alert and oriented times 3.  No focal deficits noted.    INITIAL IMPRESSION:  A 28 year old female here for evaluation of epigastric abdominal pain  radiating to her back with nausea and vomiting,  which has been ongoing now for  the last several days.  She is afebrile.  Vital signs reveal hypertension and  tachycardia.  She does have reproducible epigastric tenderness.  We will  obtain appropriate labs, CBC, CMP, lipase.  Medicate the patient  symptomatically for her pain and nausea.  We will also start IV fluids to  rehydrate the patient.    CONTINUATION BY Kaley Jutras, MD    INITIAL ASSESSMENT AND MANAGEMENT PLAN:  A 28 year old female with history of insulin dependent diabetes who presents   to the ER today complaining of significant nausea and vomiting.  She has a  history of abdominal pain that is chronic.  Today is her 4th visit in a short  number of days.  She complains that she is vomiting 12 times a day.   We are  going to check labs.    DIAGNOSTIC INTERPRETATIONS:   The patient's urine shows a CO2 of 19.  She had an anion gap of 17.  It is  otherwise unremarkable.  Her urine is negative for infection.  Did show  greater than 160 ketones.  Lactic acid 1.53.  White count is 10.3, H&H and  platelets are unremarkable.  Chemistry and LFT unremarkable.      EMERGENCY DEPARTMENT COURSE:  The patient remained in stable condition.  She was given IV fluids.  She [facility]  [facility] and [facility] to their   service.     ADMISSION DIAGNOSES:  1.  Dehydration.   2.  Acute on chronic abdominal pain.   3.  History of gastroparesis.   4.  Nausea and vomiting.      DISPOSITION:  The patient is admitted in stable condition.  Of note, her blood gas did show  a pH of 7.3, pCO2 of 32.      ___________________  Candace CruiseEmily A Antanasia Kaczynski MD  Dictated By: Truddie CrumbleAshley D. Cathlyn ParsonsBlalock, PA-C    My signature above authenticates this document and my orders, the final  diagnosis (es), discharge prescription (s), and instructions in the [facility]  Pul[facility]ck record.  Nursing notes have been reviewed by the physician/mid-level provider.    If you have any questions please contact [phone].    JB  D:12/27/2013 06:19:05  T: 12/27/2013 13:21:49  [id number]  Electronically Authenticated by:  Lyman SpellerEmily A. Luciano CutterHahn, M.D. On 01/03/2014 12:56 AM EST

## 2013-12-27 NOTE — H&P (Addendum)
[facility]  History [facility] Physical  NAME:  [name] [name], [name]  SEX:   F  ADMIT: 12/27/2013  DOB:19-Jul-1985  MR#    [id number]  ROOM:  CD09  ACCT#  [id number]    I hereby certify this patient for admission based upon medical necessity as  noted below:    <    CHIEF COMPLAINT:  Abdominal pain with nausea and vomiting.    HISTORY OF PRESENT ILLNESS:  The patient is a [age] year old female with a past medical history of  gastroparesis, type 2 diabetes and hypertension who came to the emergency room  complaining of nausea, vomiting and abdominal pain.  The patient has been  admitted several times.  Her last admission was back in 10/2013.  She was  having DKA during that admission.  She had multiple ER visits since then.  She  has been here in the emergency room seven times in November complaining of  abdominal pain.  She is suspected to have exacerbation of her gastroparesis  and admitted for further evaluation and management.    PAST MEDICAL HISTORY:  Type 1 diabetes, gastroparesis, hypertension.    PAST SURGICAL HISTORY:  Cesarean section.     ALLERGIES:  AZITHROMYCIN, DILAUDID, PENICILLIN, VANCOMYCIN AND ZITHROMAX.    HOME MEDICATIONS:  Lantus 28 units daily, Apidra 3 times a day, Phenergan p.r.n., Reglan 10 mg 4  times a day, famotidine 20 mg twice a day, vitamin D every week, lisinopril 20  mg daily.    FAMILY HISTORY:  Positive for hypertension.    SOCIAL HISTORY:  Denies smoking, alcohol or illicit drug use.    REVIEW OF SYSTEMS:  CONSTITUTIONAL:  No fever or chills.  EYES:  No visual disturbance.  EARS:  No hearing difficulty.  MOUTH:  No oral lesions.  NECK:  No neck stiffness.  CARDIOVASCULAR:  No chest pain or palpitation.  RESPIRATORY:  No cough or shortness of breathing.  GASTROINTESTINAL:  She has nausea, vomiting and abdominal pain.  GENITOURINARY:  No dysuria, frequency or urgency.  SKIN:  No rash.  MUSCULOSKELETAL:  No joint swelling or pain.  NEUROLOGIC:  No headache or dizziness.     PHYSICAL EXAMINATION:  GENERAL:  The patient is not in acute distress.  VITAL SIGNS:  Blood pressure 191/105, pulse rate 107, respirations 18,  temperature 97.4, O2 saturation 100% on room air.  HEENT:  Pupils are equal and reactive to light.  Anicteric sclerae.  NECK:  No JVD.  CARDIOVASCULAR SYSTEM:  Regular rate and rhythm, S1, S2 well heard.  No murmur  or gallop.  LUNGS:  Clear to auscultation bilaterally.  ABDOMEN:  Soft and nontender, no distention, positive bowel sounds.  EXTREMITIES:  No edema or cyanosis.  SKIN:  No rash.  MUSCULOSKELETAL:  No joint swelling or tenderness.  NEUROLOGICAL:  Alert and oriented times 3.  No focal neurologic deficit.    LABORATORIES:   Blood gas:  pH 7.37, pCO2 32.6, pO2 44, bicarbonate 19.2.  Sodium 137,  potassium 4.2, chloride 105, bicarbonate 20, BUN 11, creatinine 1, blood  glucose 257.  WBC 10.3, hemoglobin 10.9, platelets 281.  LFTs normal.  Lipase  is 65.  The patient had abdominal x-ray on 11/29, no acute finding.    ASSESSMENT AND PLAN:  1.  Intractable nausea, vomiting, probably secondary to #2.  2.  Diabetic gastroparesis  3.  Type 1 diabetes, uncontrolled.  4.  Hypertension, accelerated.    PLAN:  The patient will be admitted to medical floor,  continue IV fluid hydration.  Continue his Reglan 10 mg t.i.d. with meals and also at bedtime.  Continue  with her Lantus and Humalog.  Continue PPI.  Minimize narcotics pain  medication.  We will continue her lisinopril and p.r.n. hydralazine.  We will  continue to monitor and further recommendations will follow.      ___________________  Theodis AguasYared G Man Bonneau MD  Dictated By: .   LH  D:12/27/2013 10:50:34  T: 12/27/2013 11:27:42  [id number]  Electronically Authenticated and Edited by:  Meta HatchetYared G. Tenny Crawerefe, M.D. On 01/02/2014 11:48 AM EST

## 2013-12-28 LAB — GLUCOSE, POC
Glucose (POC): 228 mg/dL — ABNORMAL HIGH (ref 65–105)
Glucose (POC): 129 mg/dL — ABNORMAL HIGH (ref 65–105)
Glucose (POC): 61 mg/dL — ABNORMAL LOW (ref 65–105)
Glucose (POC): 89 mg/dL (ref 65–105)
Glucose (POC): 120 mg/dL — ABNORMAL HIGH (ref 65–105)

## 2013-12-29 LAB — CBC WITH AUTOMATED DIFF
BASOPHILS: 0.4 % (ref 0–3)
EOSINOPHILS: 2.6 % (ref 0–5)
HCT: 31.6 % — ABNORMAL LOW (ref 37.0–50.0)
HGB: 10.7 gm/dl — ABNORMAL LOW (ref 13.0–17.2)
IMMATURE GRANULOCYTES: 0.2 % (ref 0.0–3.0)
LYMPHOCYTES: 35.7 % (ref 28–48)
MCH: 29.7 pg (ref 25.4–34.6)
MCHC: 33.9 gm/dl (ref 30.0–36.0)
MCV: 87.8 fL (ref 80.0–98.0)
MONOCYTES: 3.9 % (ref 1–13)
MPV: 10.5 fL — ABNORMAL HIGH (ref 6.0–10.0)
NEUTROPHILS: 57.2 % (ref 34–64)
NRBC: 0 (ref 0–0)
PLATELET: 251 10*3/uL (ref 140–450)
RBC: 3.6 M/uL (ref 3.60–5.20)
RDW-SD: 39 (ref 36.4–46.3)
WBC: 10.6 10*3/uL (ref 4.0–11.0)

## 2013-12-29 LAB — METABOLIC PANEL, BASIC
BUN: 5 mg/dl — ABNORMAL LOW (ref 7–25)
CO2: 23 mEq/L (ref 21–32)
Calcium: 7.6 mg/dl — ABNORMAL LOW (ref 8.5–10.1)
Chloride: 106 mEq/L (ref 98–107)
Creatinine: 0.7 mg/dl (ref 0.6–1.3)
GFR est AA: >60
GFR est non-AA: >60
Glucose: 107 mg/dl — ABNORMAL HIGH (ref 74–106)
Potassium: 3 mEq/L — ABNORMAL LOW (ref 3.5–5.1)
Sodium: 137 mEq/L (ref 136–145)

## 2013-12-29 LAB — GLUCOSE, POC
Glucose (POC): 258 mg/dL — ABNORMAL HIGH (ref 65–105)
Glucose (POC): 76 mg/dL (ref 65–105)
Glucose (POC): 131 mg/dL — ABNORMAL HIGH (ref 65–105)
Glucose (POC): 170 mg/dL — ABNORMAL HIGH (ref 65–105)

## 2013-12-29 LAB — MAGNESIUM: Magnesium: 1.7 mg/dl — ABNORMAL LOW (ref 1.8–2.4)

## 2013-12-30 LAB — CBC WITH AUTOMATED DIFF
BASOPHILS: 0.4 % (ref 0–3)
EOSINOPHILS: 0 % (ref 0–5)
HCT: 31.8 % — ABNORMAL LOW (ref 37.0–50.0)
HGB: 10.8 gm/dl — ABNORMAL LOW (ref 13.0–17.2)
IMMATURE GRANULOCYTES: 0.2 % (ref 0.0–3.0)
LYMPHOCYTES: 33.9 % (ref 28–48)
MCH: 29.7 pg (ref 25.4–34.6)
MCHC: 34 gm/dl (ref 30.0–36.0)
MCV: 87.4 fL (ref 80.0–98.0)
MONOCYTES: 4.7 % (ref 1–13)
MPV: 11.6 fL — ABNORMAL HIGH (ref 6.0–10.0)
NEUTROPHILS: 60.8 % (ref 34–64)
NRBC: 0 (ref 0–0)
PLATELET: 242 10*3/uL (ref 140–450)
RBC: 3.64 M/uL (ref 3.60–5.20)
RDW-SD: 39.5 (ref 36.4–46.3)
WBC: 10 10*3/uL (ref 4.0–11.0)

## 2013-12-30 LAB — METABOLIC PANEL, BASIC
BUN: 3 mg/dl — ABNORMAL LOW (ref 7–25)
CO2: 24 mEq/L (ref 21–32)
Calcium: 8 mg/dl — ABNORMAL LOW (ref 8.5–10.1)
Chloride: 106 mEq/L (ref 98–107)
Creatinine: 0.7 mg/dl (ref 0.6–1.3)
GFR est AA: >60
GFR est non-AA: >60
Glucose: 170 mg/dl — ABNORMAL HIGH (ref 74–106)
Potassium: 3.5 mEq/L (ref 3.5–5.1)
Sodium: 138 mEq/L (ref 136–145)

## 2013-12-30 LAB — GLUCOSE, POC
Glucose (POC): 225 mg/dL — ABNORMAL HIGH (ref 65–105)
Glucose (POC): 108 mg/dL — ABNORMAL HIGH (ref 65–105)

## 2013-12-30 LAB — MAGNESIUM: Magnesium: 2 mg/dl (ref 1.8–2.4)

## 2014-01-14 LAB — POC CHEM8
BUN: 14 mg/dl (ref 7–25)
CALCIUM,IONIZED: 4.7 mg/dL (ref 4.40–5.40)
CO2, TOTAL: 21 mmol/L (ref 21–32)
Creatinine: 0.7 mg/dl (ref 0.6–1.3)
Glucose: 115 mg/dL — ABNORMAL HIGH (ref 74–106)
HCT: 43 % (ref 38–45)
HGB: 14.6 gm/dl (ref 13.0–17.2)
Potassium: 3.2 mEq/L — ABNORMAL LOW (ref 3.5–4.9)
Sodium: 141 mEq/L (ref 136–145)

## 2014-01-14 LAB — METABOLIC PANEL, COMPREHENSIVE
ALT (SGPT): 16 U/L (ref 12–78)
Albumin: 3.8 gm/dl (ref 3.4–5.0)
Alk. phosphatase: 108 U/L (ref 45–117)
BUN: 14 mg/dl (ref 7–25)
Bilirubin, total: 0.5 mg/dl (ref 0.2–1.0)
CO2: 23 mEq/L (ref 21–32)
Calcium: 8.8 mg/dl (ref 8.5–10.1)
Chloride: 108 mEq/L — ABNORMAL HIGH (ref 98–107)
Creatinine: 0.9 mg/dl (ref 0.6–1.3)
GFR est AA: >60
GFR est non-AA: >60
Glucose: 108 mg/dl — ABNORMAL HIGH (ref 74–106)
Protein, total: 8 gm/dl (ref 6.4–8.2)
Sodium: 141 mEq/L (ref 136–145)

## 2014-01-14 LAB — CBC WITH AUTOMATED DIFF
BASOPHILS: 0.6 % (ref 0–3)
EOSINOPHILS: 0.5 % (ref 0–5)
HCT: 38.9 % (ref 37.0–50.0)
HGB: 12.9 gm/dl — ABNORMAL LOW (ref 13.0–17.2)
IMMATURE GRANULOCYTES: 0.3 % (ref 0.0–3.0)
LYMPHOCYTES: 22.7 % — ABNORMAL LOW (ref 28–48)
MCH: 29.9 pg (ref 25.4–34.6)
MCHC: 33.2 gm/dl (ref 30.0–36.0)
MCV: 90.3 fL (ref 80.0–98.0)
MONOCYTES: 3.3 % (ref 1–13)
MPV: 12 fL — ABNORMAL HIGH (ref 6.0–10.0)
NEUTROPHILS: 72.6 % — ABNORMAL HIGH (ref 34–64)
NRBC: 0 (ref 0–0)
PLATELET: 299 10*3/uL (ref 140–450)
RBC: 4.31 M/uL (ref 3.60–5.20)
RDW-SD: 41.2 (ref 36.4–46.3)
WBC: 14.4 10*3/uL — ABNORMAL HIGH (ref 4.0–11.0)

## 2014-01-14 LAB — POC URINE MACROSCOPIC
Bilirubin: NEGATIVE
Blood: NEGATIVE
Glucose: >=1000 mg/dl — AB
Ketone: NEGATIVE mg/dl
Leukocyte Esterase: NEGATIVE
Nitrites: NEGATIVE
Protein: 100 mg/dl — AB
Specific gravity: 1.02 (ref 1.005–1.030)
Urobilinogen: 0.2 EU/dl (ref 0.0–1.0)
pH (UA): 7.5 (ref 5–9)

## 2014-01-14 LAB — POC HCG,URINE: HCG urine, QL: NEGATIVE

## 2014-01-14 LAB — LIPASE: Lipase: 123 U/L (ref 73–393)

## 2014-01-15 NOTE — ED Provider Notes (Addendum)
[facility]  EMERGENCY [facility] TREATMENT REPORT  NAME:  [name] [name], [name] [name]  SEX:   F  ADMIT: 01/14/2014  DOB:   Sep 04, 1985  MR#    [id number]  ROOM:    TIME DICTATED: 08 46 PM  ACCT#  [id number]    cc: Jerral BonitoWei Zhao MD    TIME OF EVALUATION:   [phone]    FAMILY DOCTOR:  Dr. Jerral BonitoWei Zhao     CHIEF COMPLAINT:  Abdominal pain, nausea, vomiting.    HISTORY OF PRESENT ILLNESS:  A 28 year old female who relates to me that she is having her abdominal pain  which is common for her with gastroparesis.  She states that this is \\the  same thing.\\  She woke up this morning, has been vomiting all day over 10  times.  Presents for further evaluation.  The patient was admitted this month  and discharged on 12/04 for exacerbation gastroparesis with a history of  insulin-dependent diabetes and multiple episodes of DKA as well.  The patient  relates left upper abdominal pain.    REVIEW OF SYSTEMS:  CONSTITUTIONAL:  No fevers or chills.  EYES:   No visual symptoms.  ENT:  No sore throat, runny nose, or other URI symptoms.  RESPIRATORY:  No cough, shortness of breath, or wheezing.  CARDIOVASCULAR:  No chest pain, chest pressure, or palpitations.   GASTROINTESTINAL:  As above.  Bowel movements have been unremarkable.  GENITOURINARY:  No dysuria, frequency, or urgency.   MUSCULOSKELETAL:  No joint pain or swelling.  INTEGUMENTARY:  No rashes.  NEUROLOGICAL:  No headaches, sensory or motor symptoms.    PAST MEDICAL HISTORY:  Type 1 diabetes, hypertension, C-section.  No other abdominal surgeries.    SOCIAL HISTORY:  Nonsmoker, no alcohol, no drug use.    FAMILY HISTORY:  Unknown.    ALLERGIES:  PENICILLIN, ZITHROMAX.    MEDICATIONS:  Multiple and reviewed in Ibex.     PHYSICAL EXAMINATION:  VITAL SIGNS:  Blood pressure 166/103, pulse 102, respirations 20, temperature  98.3, pain 10 out of 10, O2 sats 100% on room air.  GENERAL APPEARANCE:  Patient appears well developed and well nourished.   Appearance and behavior are age and situation appropriate.  The patient is  sitting up in the room vomiting.  HEENT:  Eyes:  Conjunctivae clear, lids normal.  Pupils equal, symmetrical,  and normally reactive.  Ears/Nose:  Hearing is grossly intact to voice.  Internal and external examinations of the ears and nose are unremarkable.  Mouth/Throat:  Surfaces of the pharynx, palate, and tongue are pink, moist,  and without lesions.  Nasal mucosa, septum, and turbinates unremarkable.  Teeth and gums unremarkable.  RESPIRATORY:  Clear and equal breath sounds.  No respiratory distress,  tachypnea, or accessory muscle use.    CARDIOVASCULAR:   Heart regular, without murmurs, gallops, rubs, or thrills.    GASTROINTESTINAL:  Abdomen soft, tender to palpation in the left upper  quadrant.  Minimal left lower tenderness.  No other abdominal tenderness,  rebound or guarding.  No CVA or flank tenderness.  The patient is ambulatory.  SPINE:  There is no localized cervical, thoracic, lumbar or sacral body  tenderness to palpation or fist percussion.  There are no bony step-offs,  ecchymosis, areas of soft tissue swelling or deformities.   SKIN:  Warm and dry without rashes.  NEUROLOGIC:  Alert, oriented.  Sensation intact, motor strength equal and  symmetric.  There is no facial asymmetry or dysarthria.  Cranial nerves  II-XII intact.    INITIAL ASSESSMENT AND PLAN:  We will obtain an i-STAT chem-8 to evaluate for DKA, GI laboratory studies  given left upper abdominal pain to rule out hepatitis, pancreatitis or  metabolic abnormalities, urine for infection or UTI, urine pregnancy for  related illness, medicated symptomatically.  I suspect, as does the patient,  this is an exacerbation of the patient's gastroparesis.    DIAGNOSTIC STUDIES:  Urine pregnancy negative.  Urinalysis showed over 1000 glucose, 100 protein,  otherwise normal.  I-STAT Chem-8 showed potassium 3.2, glucose 115.  There was   no reported value for chloride, otherwise it was normal.  Lipase was normal.  CMP showed chloride 108, glucose 108, otherwise normal.  CBC showed white  count of 14.4, hemoglobin 12.9, MPV 12, neutrophils 72.6%, lymphocytes 22.7%  otherwise normal.      COURSE:   Patient was stable in the Emergency Department, did not develop other  symptoms.  She was medicated with the same cocktail medications that she  received on her prior visit of 1 L of fluids IV, Benadryl, Ativan and  Phenergan.  She is very sedated after this, and it took some time before she  was awake enough to talk with me.  She was able to tell me that she could call  her mother for a ride and was alert at the time of discharge.  Given a second  liter of IV fluids.  Given that her nausea was controlled, and there were no  signs of DKA with a calculated anion gap of 10, normal GI laboratory studies  and recent admission, it was not felt necessary to admit the patient at this  time.  Her blood pressure did stay high.  It was 193/105 at the time of  discharge.  She was advised to follow up with PCP for recheck of this.  Return  with new or worsening symptoms and subsequently discharged.    FINAL DIAGNOSIS:  1.  Acute on chronic abdominal pain.    2.  High blood pressure.  3.  Nausea with vomiting unspecified.   4.  Type 1 insulin-dependent diabetes mellitus, controlled.     DISPOSITION AND TREATMENT PLAN:  Discharge instructions given.  Prescription for Phenergan and Bentyl.  The  patient is discharged home in stable condition, with instructions to follow up  with their regular doctor.  They are advised to return immediately for any  worsening or symptoms of concern. The patient was personally evaluated by  myself and Dr. Stephan MinisterMolly Mathews  who agrees with the above assessment and plan.      ___________________  Carin HockMolly J Mathews MD  Dictated By: Mearl LatinBryan P. Lockie ParesHendrick, PA-C    My signature above authenticates this document and my orders, the final   diagnosis (es), discharge prescription (s), and instructions in the [facility]  Pul[facility]heck record.  Nursing notes have been reviewed by the physician/mid-level provider.    If you have any questions please contact [phone].    LH  D:01/14/2014 20:46:02  T: 01/15/2014 12:08:38  [id number]  Electronically Authenticated and Edited by:  Nehemiah MassedMolly J. Jerolyn CenterMathews, M.D. On 02/02/2014 02:42 PM EST

## 2014-01-16 LAB — POC CHEM8
BUN: 18 mg/dl (ref 7–25)
CALCIUM,IONIZED: 5.1 mg/dL (ref 4.40–5.40)
CO2, TOTAL: 18 mmol/L — ABNORMAL LOW (ref 21–32)
Chloride: 103 mEq/L (ref 98–107)
Creatinine: 0.7 mg/dl (ref 0.6–1.3)
Glucose: 159 mg/dL — ABNORMAL HIGH (ref 74–106)
HCT: 39 % (ref 38–45)
HGB: 13.3 gm/dl (ref 13.0–17.2)
Potassium: 3.3 mEq/L — ABNORMAL LOW (ref 3.5–4.9)
Sodium: 140 mEq/L (ref 136–145)

## 2014-01-16 LAB — POC BLOOD GAS + LACTIC ACID
BASE EXCESS: -4 mmol/L — ABNORMAL LOW (ref <=3)
BICARBONATE: 20.6 mmol/L — ABNORMAL LOW (ref 22.0–26.0)
CO2, TOTAL: 22 mmol/L (ref 21–32)
Lactic Acid: 0.85 mmol/L (ref 0.40–2.00)
O2 SAT: 87 % — ABNORMAL HIGH (ref 70–75)
PCO2: 34.2 mm Hg — ABNORMAL LOW (ref 41.0–51.0)
PO2: 53 mm Hg — ABNORMAL HIGH (ref 35–40)
pH: 7.388 (ref 7.310–7.410)

## 2014-01-16 LAB — POC URINE MICROSCOPIC

## 2014-01-16 LAB — POC URINE MACROSCOPIC
Blood: NEGATIVE
Glucose: 100 mg/dl — AB
Ketone: 40 mg/dl — AB
Nitrites: NEGATIVE
Protein: 100 mg/dl — AB
Specific gravity: >=1.03 (ref 1.005–1.030)
Urobilinogen: 1 EU/dl (ref 0.0–1.0)
pH (UA): 6 (ref 5–9)

## 2014-01-16 LAB — POC HCG,URINE: HCG urine, QL: NEGATIVE

## 2014-01-16 NOTE — ED Provider Notes (Addendum)
[facility]  EMERGENCY DEPARTMENT TREATMENT REPORT  NAME:  [name] SprinklesMOORE, Fatim  SEX:   F  ADMIT: 01/16/2014  DOB:   [birth year]  MR#    [id number]  ROOM:    TIME DICTATED: 01 35 PM  ACCT#  [id number]    cc: Jerral BonitoWei Zhao MD    PRIMARY CARE PHYSICIAN:    Jerral BonitoWei Zhao, MD     CHIEF COMPLAINT:    Abdominal pain, vomiting.    HISTORY OF PRESENT ILLNESS:  This is a 28 year old female, insulin-dependent diabetic who arrived via medic  with complaints of abdominal pain and vomiting.  She was seen here for similar  evaluation on 01/14/14.  She states at the time of discharge her symptoms had  resolved.  She had been doing well until she woke up this morning with a  constant aching pain to the periumbilical region as well as nausea, vomiting.  She has had at least with 5 episodes of emesis.  Denies any hematemesis.  She  has attempted to take her Reglan at home without relief.  Therefore, called  medic for assistance and was brought here at this time for further evaluation  and management.  She denies any fevers or chills, no chest pain, diarrhea.    REVIEW OF SYSTEMS:  CONSTITUTIONAL:  No fevers or chills.  EYES:   No visual symptoms.    ENT:  No sore throat, runny nose, or other URI symptoms.    RESPIRATORY:  No cough, shortness of breath, or wheezing.    CARDIOVASCULAR:  No chest pain, chest pressure, or palpitations.    GASTROINTESTINAL:  As above.  GENITOURINARY:  No dysuria.  MUSCULOSKELETAL:  No extremity pain.  INTEGUMENTARY:  No rash.  NEUROLOGICAL:  No headache.    PAST MEDICAL HISTORY:  Gastroparesis, insulin-dependent diabetes, hypertension.    SOCIAL HISTORY:  Lives with family.    MEDICATIONS:  Reviewed in Ibex.    ALLERGIES:  REVIEWED IN IBEX.    PHYSICAL EXAMINATION:  VITAL SIGNS:  Blood pressure 184/96, pulse 99, respirations not obtained,  temperature 98.1, O2 saturation 99% on room air, pain rated 10 out of 10.    GENERAL APPEARANCE:  The patient appears well developed and well nourished.   Appearance and behavior are age and situation appropriate.    GENERAL APPEARANCE:  The patient appears.  She is lying on stretcher, nontoxic  appearing.  As soon as you enter the room, she starts to moan in pain.  HEENT:  Head NC/AT.  Eyes:  Conjunctivae are clear, nonicteric.  Mouth and  throat:  Oral mucosa is pink and dry.    LYMPHATICS:  No cervical or submandibular lymphadenopathy palpated.  RESPIRATORY:  Clear and equal breath sounds throughout all fields.  CARDIOVASCULAR:  Regular rate and rhythm, no murmurs.  GASTROINTESTINAL:  Abdomen is soft, minimal discomfort in the periumbilical  region.  No additional areas of tenderness.  No rebound or guarding.  There is  no tenderness to the epigastrium.  Negative Murphy's sign.  MUSCULOSKELETAL:  She moves all extremities spontaneously and without  difficulty.  Negative CVA tenderness.  SKIN:  Warm and dry without rashes.  NEUROLOGIC:  The patient is alert and oriented.  Muscle  strength is 5 out of  5 to bilateral upper and lower extremities.    CONTINUATION BY MICHELLE ADAMS, PA-C    INITIAL ASSESSMENT AND MANAGEMENT PLAN:  An insulin-dependent diabetic presenting with complaint of abdominal pain and  vomiting, appears dehydrated.  Today we will  obtain i-STAT Chem-8 as well as a  VBG, urinalysis, urine pregnancy test and will treat symptomatically.  Disposition will be based on clinical course.    DIAGNOSTIC STUDIES:  I-STAT with glucose of 159, potassium 3.3, CO2 is 18.  Urine pregnancy test  negative.  Urinalysis with 40 ketones, 100 glucose, trace leukocyte esterase.  Microscopic evaluation revealed 15 to 29 squamous epithelial cells, 5 to 9  WBCs and occasional crystals.  Her VBG with pCO2 of 34.2, pO2 of 53,  bicarbonate 20.6.    COURSE IN THE EMERGENCY DEPARTMENT:  The patient remained stable throughout her stay.  She was given a total of 2  liters normal saline for hydration, 10 mg of IV Reglan 1 mg of IV Ativan and   25 mg IV Benadryl.  She was improved.  She had no further episodes of  vomiting.  At this point, she has a normal gap, does not appear to be in DKA.  She has a slight amount of ketones within her urine.  She has been hydrated  appropriately.  Her blood sugar appears to be controlled.  At this time, she  appears stable for discharge home.    CLINICAL IMPRESSION AND DIAGNOSES:    1.  Acute on chronic abdominal pain.  2.  Acute on chronic nausea and vomiting.    DISPOSITION AND PLAN:  The patient is discharged home in stable condition.  Instructed to follow up  with her primary care physician, to continue all of her daily medications, to  return at anytime for any worsening or symptoms of concern.      The patient was personally evaluated by myself and Dr. Dixie DialsBen Deshawna Mcneece, who  agrees with the above assessment and plan.      ___________________  Christiana PellantBen A Arlynn Stare MD  Dictated By: Zachary GeorgeMichelle M. Pernell DupreAdams, PA-C    My signature above authenticates this document and my orders, the final  diagnosis (es), discharge prescription (s), and instructions in the [facility]  Pulsecheck record.  Nursing notes have been reviewed by the physician/mid-level provider.    If you have any questions please contact [phone].    LR  D:01/16/2014 13:35:11  T: 01/16/2014 21:09:44  [id number]  Electronically Authenticated by:  Carlis StableBen A. Carmela HurtFickenscher, M.D. On 01/20/2014 07:43 PM EST

## 2014-01-17 LAB — METABOLIC PANEL, COMPREHENSIVE
ALT (SGPT): 13 U/L (ref 12–78)
AST (SGOT): 10 U/L — ABNORMAL LOW (ref 15–37)
Albumin: 3.5 gm/dl (ref 3.4–5.0)
Alk. phosphatase: 90 U/L (ref 45–117)
BUN: 12 mg/dl (ref 7–25)
Bilirubin, total: 1.1 mg/dl — ABNORMAL HIGH (ref 0.2–1.0)
CO2: 19 mEq/L — ABNORMAL LOW (ref 21–32)
Calcium: 8.3 mg/dl — ABNORMAL LOW (ref 8.5–10.1)
Chloride: 105 mEq/L (ref 98–107)
Creatinine: 0.9 mg/dl (ref 0.6–1.3)
GFR est AA: >60
GFR est non-AA: >60
Glucose: 333 mg/dl — ABNORMAL HIGH (ref 74–106)
Potassium: 3.7 mEq/L (ref 3.5–5.1)
Protein, total: 7.1 gm/dl (ref 6.4–8.2)
Sodium: 137 mEq/L (ref 136–145)

## 2014-01-17 LAB — CBC WITH AUTOMATED DIFF
BASOPHILS: 0.5 % (ref 0–3)
EOSINOPHILS: 0 % (ref 0–5)
HCT: 37.1 % (ref 37.0–50.0)
HGB: 12.4 gm/dl — ABNORMAL LOW (ref 13.0–17.2)
IMMATURE GRANULOCYTES: 0.3 % (ref 0.0–3.0)
LYMPHOCYTES: 20.2 % — ABNORMAL LOW (ref 28–48)
MCH: 29.7 pg (ref 25.4–34.6)
MCHC: 33.4 gm/dl (ref 30.0–36.0)
MCV: 89 fL (ref 80.0–98.0)
MONOCYTES: 4.6 % (ref 1–13)
MPV: 11.6 fL — ABNORMAL HIGH (ref 6.0–10.0)
NEUTROPHILS: 74.4 % — ABNORMAL HIGH (ref 34–64)
NRBC: 0 (ref 0–0)
PLATELET: 254 10*3/uL (ref 140–450)
RBC: 4.17 M/uL (ref 3.60–5.20)
RDW-SD: 40.4 (ref 36.4–46.3)
WBC: 10.8 10*3/uL (ref 4.0–11.0)

## 2014-01-17 LAB — POC BLOOD GAS + LACTIC ACID
BASE EXCESS: -6 mmol/L — ABNORMAL LOW (ref <=3)
BICARBONATE: 19.8 mmol/L — ABNORMAL LOW (ref 22.0–26.0)
CO2, TOTAL: 21 mmol/L (ref 21–32)
Lactic Acid: 0.7 mmol/L (ref 0.40–2.00)
O2 SAT: 89 % — ABNORMAL HIGH (ref 70–75)
PCO2: 35.8 mm Hg — ABNORMAL LOW (ref 41.0–51.0)
PO2: 58 mm Hg — ABNORMAL HIGH (ref 35–40)
pH: 7.35 (ref 7.310–7.410)

## 2014-01-17 LAB — POC URINE MACROSCOPIC
Bilirubin: NEGATIVE
Blood: NEGATIVE
Glucose: 500 mg/dl — AB
Ketone: 80 mg/dl — AB
Leukocyte Esterase: NEGATIVE
Nitrites: NEGATIVE
Protein: 30 mg/dl — AB
Specific gravity: 1.015 (ref 1.005–1.030)
Urobilinogen: 1 EU/dl (ref 0.0–1.0)
pH (UA): 6.5 (ref 5–9)

## 2014-01-17 LAB — LIPASE: Lipase: 74 U/L (ref 73–393)

## 2014-01-17 LAB — POC HCG,URINE: HCG urine, QL: NEGATIVE

## 2014-01-17 NOTE — ED Provider Notes (Addendum)
[facility]  EMERGENCY DE[facility]MENT TREATMENT REPORT  NAME:  [name] SprinklesMOORE, Janet  SEX:   F  ADMIT: 01/17/2014  DOB:   Feb 05, 1985  MR#    [id number]  ROOM:    TIME DICTATED: 10 55 AM  ACCT#  [id number]    cc: Mike GipScott Yagel MD, Jerral BonitoWei Zhao MD    TIME OF EVALUATION:  6:43 a.m.    CHIEF COMPLAINT:  Abdominal pain, vomiting.    HISTORY OF PRESENT ILLNESS:  The patient is a 28 year old female with history of gastroparesis,  insulin-dependent diabetes coming in today for evaluation of nausea and  vomiting, upper and left side abdominal pain that began at 7:00 a.m.  yesterday.  She has chronic abdominal pain due to gastroparesis and vomiting  as a result of that as well.  She was evaluated here yesterday and sent home.  She said she never stopped vomiting, last night tried to eat some peaches and  they came right back up and this morning just could not take it anymore, so  she represents for reassessment.  Her last bowel movement was yesterday  morning and was normal per her.  She denies any melena, hematochezia, no  diarrhea.  Has not had a fever or chills, and her pain is no different than  prior.    REVIEW OF SYSTEMS:  CONSTITUTIONAL:  No fevers.  ENT:  No sore throat, runny nose, or other URI symptoms.  ENDOCRINE:  Insulin-dependent diabetes.  RESPIRATORY:  No shortness of breath.  CARDIOVASCULAR:  No chest pain.  GASTROINTESTINAL:  Upper and left-sided abdominal pain with nausea, vomiting.  No diarrhea.  No melena or hematochezia.    GENITOURINARY:  No dysuria, frequency, or urgency.  MUSCULOSKELETAL:  No joint pain or swelling.  INTEGUMENTARY:  No rashes.  NEUROLOGIC:  No headache.    PAST MEDICAL HISTORY:  Gastroparesis, hypertension, chronic vomiting, type 1 diabetes, C-section.    FAMILY HISTORY:  Not provided.    SOCIAL HISTORY:  No alcohol, tobacco or drug use.  Lives at home.    ALLERGIES:  REVIEWED IN THE IBEX.    MEDICATIONS:  Reviewed in the Ibex.    PHYSICAL EXAMINATION:   VITAL SIGNS:  Blood pressure 136/99, pulse 122, respirations 22, temperature  98.1, sats 100% on room air.  Recheck of her pulse following 1.5 liters of  fluid was 101.  GENERAL APPEARANCE:  Patient appears well developed and well nourished.  Appearance and behavior are age and situation appropriate.  The patient is  just arriving, sitting on the side of a stretcher when I walk in the room to  assess her.  She looks like she does not feel great, but not acutely   distressed.  ENT:  Oral mucosa is very dry.  NECK:  Supple, symmetrical.  Trachea is midline.  Teeth and gums unremarkable.  RESPIRATORY:  Clear and equal breath sounds.  No respiratory distress,  tachypnea, or accessory muscle use.  No wheezes, rales, rhonchi, cough  appreciated.    HEART:  Tachycardic, but regular.  No appreciable murmurs, rubs, gallops, or  thrills.  GASTROINTESTINAL:  Abdomen is soft.  She is kind of diffusely tender  throughout palpation of the abdomen.  No focal discomfort appreciated.  No  guarding or rebound noted or abnormal masses.  MUSCULOSKELETAL:  Stance and gait appear normal.  SKIN:  Warm and dry.  NEUROLOGICAL:  She is appropriately oriented and responsive.      INITIAL ASSESSMENT AND PLAN:  The patient is a  28 year old female coming in for evaluation of acute on  chronic abdominal pain and vomiting.  She is an insulin-dependent diabetic, so  we must consider possibility for being in DKA at this time.  She looks pretty  good, does not have a ketotic smelling breath at this time.  She is  tachycardic on arrival, but clinically looks extremely dehydrated.  We will  also start some IV fluids and antiemetics on her.  We will not treat her with  narcotics given her history of gastroparesis.    CONTINUATION  BY SHANNON WALLACE PA-C    DIAGNOSTIC INTERPRETATIONS:  The patient's urinalysis showed 500 glucose, 80 ketones, 30 protein, negative  urine pregnancy, otherwise urinalysis  unremarkable.  CBC unremarkable, other   than 74% segs.  Lipase is normal.  Metabolic panel shows a glucose of 333 with  a bicarbonate of 19, calcium of 8.3, total bilirubin of 1.1.  Her gap was 15  calculated by those results. Blood gas showed a pH of 7.30 so she is not  acidotic, with a bicarbonate of 19.8.    Plain view x-ray showed no obstruction, read by Dr. Melvyn NethLewis, Radiology.  It does  not appear the patient is in a DKA state. She clinically looked much better  after some IV fluids.  She received a total of 3 L IV normal saline, was given  1 mg Ativan and 10 mg Reglan IV and was sleeping for several hours. Throughout  her stay, we did awaken her and asked her how she was doing and she asked to  go home.  She was given 1 more milligram of Ativan and discharged home in  stable condition per her own request.    DIAGNOSES:  1.  Acute on chronic upper abdominal pain.  2.  Acute on chronic nausea and vomiting.  3.  Type 1 diabetes with hyperglycemia.     DISPOSITION:  She was discharged in stable condition with abdominal pain and vomiting,  hyperglycemia, instructions to drink fluids for hydration.  Follow up with PCP  doctor, the office of GI Dr. Nolon NationsYagel and return with new or worsening concerns.    The patient was personally evaluated by myself and Dr. Wenda OverlandHimmel-Azari Hasler, who  agrees with the above assessment and plan.      ___________________  Liberty HandyJennifer H Himmel-Lalah Durango DO  Dictated By: Bettey MareShannon R. Earlene PlaterWallace, PA-C    My signature above authenticates this document and my orders, the final  diagnosis (es), discharge prescription (s), and instructions in the [facility]  Pulsecheck record.  Nursing notes have been reviewed by the physician/mid-level provider.    If you have any questions please contact [phone].    SW  D:01/17/2014 10:55:24  T: 01/17/2014 19:59:33  [id number] DISPOSITION:  Electronically Authenticated and Edited byCarolin Guernsey:  [place]oklyn Jeff HIMMEL-Baneza Bartoszek, DO On 01/19/2014 07:42 AM EST

## 2014-01-19 LAB — POC BLOOD GAS + LACTIC ACID
BASE EXCESS: -3 mmol/L — ABNORMAL LOW (ref <=3)
BICARBONATE: 21.7 mmol/L — ABNORMAL LOW (ref 22.0–26.0)
CO2, TOTAL: 23 mmol/L (ref 21–32)
Lactic Acid: 0.92 mmol/L (ref 0.40–2.00)
O2 SAT: 92 % — ABNORMAL HIGH (ref 70–75)
PCO2: 32.2 mm Hg — ABNORMAL LOW (ref 41.0–51.0)
PO2: 61 mm Hg — ABNORMAL HIGH (ref 35–40)
pH: 7.437 — ABNORMAL HIGH (ref 7.310–7.410)

## 2014-01-19 LAB — POC URINE MACROSCOPIC
Bilirubin: NEGATIVE
Blood: NEGATIVE
Glucose: 500 mg/dl — AB
Ketone: 40 mg/dl — AB
Leukocyte Esterase: NEGATIVE
Nitrites: NEGATIVE
Protein: NEGATIVE mg/dl
Specific gravity: 1.015 (ref 1.005–1.030)
Urobilinogen: 0.2 EU/dl (ref 0.0–1.0)
pH (UA): 7 (ref 5–9)

## 2014-01-19 LAB — CBC WITH AUTOMATED DIFF
BASOPHILS: 0.4 % (ref 0–3)
EOSINOPHILS: 0 % (ref 0–5)
HCT: 34.6 % — ABNORMAL LOW (ref 37.0–50.0)
HGB: 11.8 gm/dl — ABNORMAL LOW (ref 13.0–17.2)
IMMATURE GRANULOCYTES: 0.3 % (ref 0.0–3.0)
LYMPHOCYTES: 12.8 % — ABNORMAL LOW (ref 28–48)
MCH: 29.4 pg (ref 25.4–34.6)
MCHC: 34.1 gm/dl (ref 30.0–36.0)
MCV: 86.1 fL (ref 80.0–98.0)
MONOCYTES: 2.7 % (ref 1–13)
MPV: 12.3 fL — ABNORMAL HIGH (ref 6.0–10.0)
NEUTROPHILS: 83.8 % — ABNORMAL HIGH (ref 34–64)
NRBC: 0 (ref 0–0)
PLATELET: 262 10*3/uL (ref 140–450)
RBC: 4.02 M/uL (ref 3.60–5.20)
RDW-SD: 38.4 (ref 36.4–46.3)
WBC: 10.3 10*3/uL (ref 4.0–11.0)

## 2014-01-19 LAB — METABOLIC PANEL, COMPREHENSIVE
ALT (SGPT): 14 U/L (ref 12–78)
AST (SGOT): 12 U/L — ABNORMAL LOW (ref 15–37)
Albumin: 3.7 gm/dl (ref 3.4–5.0)
Alk. phosphatase: 99 U/L (ref 45–117)
BUN: 9 mg/dl (ref 7–25)
Bilirubin, total: 0.8 mg/dl (ref 0.2–1.0)
CO2: 20 mEq/L — ABNORMAL LOW (ref 21–32)
Calcium: 8.2 mg/dl — ABNORMAL LOW (ref 8.5–10.1)
Chloride: 101 mEq/L (ref 98–107)
Creatinine: 0.9 mg/dl (ref 0.6–1.3)
GFR est AA: >60
GFR est non-AA: >60
Glucose: 329 mg/dl — ABNORMAL HIGH (ref 74–106)
Potassium: 3.4 mEq/L — ABNORMAL LOW (ref 3.5–5.1)
Protein, total: 7.4 gm/dl (ref 6.4–8.2)
Sodium: 136 mEq/L (ref 136–145)

## 2014-01-19 LAB — POC HCG,URINE: HCG urine, QL: NEGATIVE

## 2014-01-19 LAB — LIPASE: Lipase: 105 U/L (ref 73–393)

## 2014-01-19 NOTE — ED Provider Notes (Addendum)
[facility]  EMERGENCY [facility] TREATMENT REPORT  NAME:  [name] SprinklesMOORE, [name]  SEX:   F  ADMIT: 01/19/2014  DOB:   Oct 22, 1985  MR#    [id number]  ROOM:    TIME DICTATED: 03 36 PM  ACCT#  [id number]        DATE AND TIME OF EVALUATION:  Thursday 01/19/2014.    CHIEF COMPLAINT:  Abdominal pain.    HISTORY OF PRESENT ILLNESS:  This 28 year old female presents to the ED with 10/10 left upper quadrant  abdominal pain that started about 6 or 7 hours ago.  She does have a history  of diabetes type 2 for which she takes insulin.  She also takes Reglan for  gastroparesis.  She has had intractable vomiting, too many episodes to count  of vomiting since this pain started as well.  She says she took her  medications, lisinopril and Reglan today, without relief of symptoms.  She  denies any fever or chills.  No diarrhea and no urinary symptoms.  The patient  has actually been seen here 3 other times in the last week for the same pain.  She did have elevated blood sugar, but otherwise had normal labs and normal  abdominal series x-ray series.  She was given Benadryl, Reglan and Ativan.  She was hydrated with 3 liters of fluid the last time she was here and then  was told to follow up with GI when she was feeling better and actually  requested to go home.    REVIEW OF SYSTEMS:  CONSTITUTIONAL:  No fever, chills, or weight loss.  EYES:   No visual symptoms.  ENT:  No sore throat, runny nose, or other URI symptoms.  HEMATOLOGIC/LYMPHATIC:   No excessive bruising or lymph node swelling.  RESPIRATORY:  No cough, shortness of breath, or wheezing.  CARDIOVASCULAR:  No chest pain, chest pressure, or palpitations.  GASTROINTESTINAL:  See HPI.  GENITOURINARY:  No dysuria, frequency, or urgency.  MUSCULOSKELETAL:  No joint pain or swelling.  INTEGUMENTARY:  No rashes.  NEUROLOGICAL:  No headaches, sensory or motor symptoms.              PAST MEDICAL HISTORY:  Diabetes, gastroparesis, hypertension.    SURGICAL HISTORY:  C-section.     SOCIAL HISTORY:  The patient does not smoke, abuse drugs or alcohol.    MEDICATIONS:  Reviewed in Ibex.    ALLERGIES:  REVIEWED IN IBEX.    PHYSICAL EXAMINATION:  GENERAL APPEARANCE:  Well-developed, well-nourished female lying on exam table  in no acute distress.  VITAL SIGNS:  Blood pressure is 152/78, pulse 90, respirations 18, temperature  98.3, O2 sat 100% on room air.  HEENT: Eyes:  Conjunctivae clear, lids normal.  Pupils equal, symmetrical, and  normally reactive.  RESPIRATORY:  Clear and equal breath sounds.  No respiratory distress,  tachypnea, or accessory muscle use.    CARDIOVASCULAR:   Heart regular, without murmurs, gallops, rubs, or thrills.    GASTROINTESTINAL:  Abdomen is soft but tender to palpation in the left upper  quadrant.   MUSCULOSKELETAL:  Nails:  No clubbing or deformities.  Nailbeds pink with  prompt capillary refill.  SKIN:  Warm and dry without rashes.  NEUROLOGIC:  Alert, oriented.  Sensation intact, motor strength equal and  symmetric.    CONTINUATION BY Josedaniel Haye Wilfrid LundVANDEN HOEK, MD    EMERGENCY DEPARTMENT COURSE:  The patient was given additional IV fluids in the Emergency Department for 2  liters.  The patient  was resting comfortably, felt able to go home.  She will  be discharged home with instructions to drink plenty of fluids.  Will follow  with primary care provider and be reevaluated for worsening symptoms.    FINAL DIAGNOSES:  1.  Acute abdominal pain and emesis.  2.  Chronic gastroparesis.    DISPOSITION:  The patient was discharged home in stable and improved condition.      ___________________  Erlinda Hongodd Vanden Hoek MD  Dictated By: Thea SilversmithSarah Gregory, PA-C    My signature above authenticates this document and my orders, the final  diagnosis (es), discharge prescription (s), and instructions in the [facility]  Pulsecheck record.  Nursing notes have been reviewed by the physician/mid-level provider.    If you have any questions please contact [phone].    NS  D:01/19/2014 15:36:52   T: 01/19/2014 18:29:46  [id number]  Electronically Authenticated by:  Errik Mitchelle L. Wilfrid LundVanden Hoek, M.D. On 01/28/2014 04:33 PM EST

## 2014-01-27 HISTORY — PX: GASTRIC STIMULATOR IMPLANT SURGERY: SHX654

## 2014-02-09 LAB — METABOLIC PANEL, COMPREHENSIVE
ALT (SGPT): 17 U/L (ref 12–78)
AST (SGOT): 19 U/L (ref 15–37)
Albumin: 3.5 gm/dl (ref 3.4–5.0)
Alk. phosphatase: 110 U/L (ref 45–117)
BUN: 10 mg/dl (ref 7–25)
Bilirubin, total: 0.5 mg/dl (ref 0.2–1.0)
CO2: 23 mEq/L (ref 21–32)
Calcium: 8.8 mg/dl (ref 8.5–10.1)
Chloride: 103 mEq/L (ref 98–107)
Creatinine: 0.8 mg/dl (ref 0.6–1.3)
GFR est AA: >60
GFR est non-AA: >60
Glucose: 223 mg/dl — ABNORMAL HIGH (ref 74–106)
Potassium: 3.8 mEq/L (ref 3.5–5.1)
Protein, total: 7.8 gm/dl (ref 6.4–8.2)
Sodium: 138 mEq/L (ref 136–145)

## 2014-02-09 LAB — CBC WITH AUTOMATED DIFF
BASOPHILS: 0.3 % (ref 0–3)
EOSINOPHILS: 0 % (ref 0–5)
HCT: 39.5 % (ref 37.0–50.0)
HGB: 12.8 gm/dl — ABNORMAL LOW (ref 13.0–17.2)
IMMATURE GRANULOCYTES: 0.3 % (ref 0.0–3.0)
LYMPHOCYTES: 20.6 % — ABNORMAL LOW (ref 28–48)
MCH: 29.2 pg (ref 25.4–34.6)
MCHC: 32.4 gm/dl (ref 30.0–36.0)
MCV: 90.2 fL (ref 80.0–98.0)
MONOCYTES: 3.9 % (ref 1–13)
MPV: 12.6 fL — ABNORMAL HIGH (ref 6.0–10.0)
NEUTROPHILS: 74.9 % — ABNORMAL HIGH (ref 34–64)
NRBC: 0 (ref 0–0)
PLATELET: 289 10*3/uL (ref 140–450)
RBC: 4.38 M/uL (ref 3.60–5.20)
RDW-SD: 40.6 (ref 36.4–46.3)
WBC: 11.4 10*3/uL — ABNORMAL HIGH (ref 4.0–11.0)

## 2014-02-09 LAB — POC URINE MACROSCOPIC
Bilirubin: NEGATIVE
Blood: NEGATIVE
Glucose: >=1000 mg/dl — AB
Ketone: 80 mg/dl — AB
Leukocyte Esterase: NEGATIVE
Nitrites: NEGATIVE
Protein: 100 mg/dl — AB
Specific gravity: 1.02 (ref 1.005–1.030)
Urobilinogen: 0.2 EU/dl (ref 0.0–1.0)
pH (UA): 7 (ref 5–9)

## 2014-02-09 LAB — LIPASE: Lipase: 116 U/L (ref 73–393)

## 2014-02-09 LAB — POC HCG,URINE: HCG urine, QL: NEGATIVE

## 2014-02-09 LAB — GLUCOSE, POC: Glucose (POC): 223 mg/dL — ABNORMAL HIGH (ref 65–105)

## 2014-02-09 NOTE — H&P (Addendum)
[facility]  History and Physical  NAME:  Lamar SprinklesMOORE, [name]  SEX:   F  ADMIT: 02/09/2014  DOB:1985-05-03  MR#    [id number]  ROOM:  5131  ACCT#  [id number]    I hereby certify this patient for admission based upon medical necessity as  noted below:    <    HISTORY OF PRESENT ILLNESS:  This patient is a 29 year old female who has a history of diabetes and  gastroparesis, comes to the hospital with vomiting which started this morning  at about 7 a.m.  Also, lower back pain and abdominal pain.  She says that she  has history of gastroparesis and feels like she had worsening episode of that.  She is complaining of pain which initially when she came was 9/10,  nonradiating, associated with nausea and vomiting.  She is a diabetic and is  on Lantus for the same.  [facility]  [facility] and has been on medicines.  She has been discovering poor appetite  secondary to because whenever she tries to eat she has been having vomiting.  No fever, chills, chest pain, shortness of breath or urinary symptoms.  She is  G1, P1 and is on Depo with last injection September.    REVIEW OF SYSTEMS:  CONSTITUTIONAL:  Positive for decreased appetite, not feeling well.  HEENT:  Negative for hearing loss or ear pain or throat pain.  EYES:  Negative for vision symptoms or blurred vision.  RESPIRATORY:  Negative for cough or shortness of breath.  CARDIOVASCULAR:  Negative for chest pain or palpitations.  GASTROINTESTINAL:  Positive for nausea, vomiting, abdominal pain.  GENITOURINARY:  Negative for dysuria or urgency.    MUSCULOSKELETAL:  Positive for back pain.  SKIN:  Negative for wound.  NEUROLOGICAL:  Negative for dizziness, syncope, weakness.    PAST MEDICAL HISTORY:  Diabetes, gastroparesis, asthma.    PAST SURGICAL HISTORY:  C-section in 2007, EGD in 2010.    FAMILY HISTORY:  Diabetes, cancer, heart failure, hypertension in maternal side.    SOCIAL HISTORY:   Never a smoker.  Does not abuse alcohol or drugs.  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    ALLERGIES:  PENICILLIN.    LABORATORY FINDINGS:  A1c of 8.5.  Glucose 223, sodium 138, potassium 3.8, chloride 103, bicarbonate  23, glucose 223, BUN 10, creatinine 0.8, alkaline phosphatase 110, albumin  3.5, calcium 8.8.  Lipase 116.  White count 11.4, hemoglobin and hematocrit  12.8 and 39.5, platelets 289.  Pregnancy test is negative.    PHYSICAL EXAMINATION:  VITAL SIGNS:  Blood pressure 156/100, pulse 97, respiration 18, saturating 99%  on room air.    NEUROLOGIC: She is not oriented to time, place and person.  HEENT:  Head normocephalic, atraumatic.  Eyes:  EOM normal.  NECK:  Normal range of motion, supple.  CARDIOVASCULAR:  S1, S2 heard, tachycardia.  PULMONARY:  Effort normal.  Breath sounds. No wheezes.  ABDOMEN:  Soft.  Tenderness all over the abdomen, more on the left side.  No  rebound, no guarding.  MUSCULOSKELETAL:  No edema.  Normal pulsations.  No ischemia.  NEUROLOGIC:  Alert and oriented times 3.  PSYCHIATRIC:  Flat affect.  SKIN:  Warm and dry.    ASSESSMENT:  1.  Intractable nausea and vomiting.  2.  Acute on chronic gastroparesis.  3.  Uncontrolled diabetes.  4.  Severe dehydration.  5.  Type 1 diabetes.  6.  Hypertension.     PLAN:   Treat with IV fluids, IV antiemetics and Reglan.  Blood sugar will be  monitored.  We will continue Lantus and sliding scale insulin.  Continue  lisinopril for hypertension.  Plan discussed with the patient in detail.    Total time spent more than 50% of time examining the patient, reviewing the  patient's history, discussion with the patient in detail is 40 minutes.      ___________________  Roderic Scarce MD  Dictated By: .   Edmonia Caprio  D:02/09/2014 20:02:57  T: 02/09/2014 20:58:11  [id number]  Electronically Authenticated by:  Roderic Scarce, MD On 02/12/2014 05:13 PM EST

## 2014-02-09 NOTE — ED Provider Notes (Addendum)
[facility]  EMERGENCY [facility] TREATMENT REPORT  NAME:  [name] SprinklesMOORE, Hazleigh  SEX:   F  ADMIT: 02/09/2014  DOB:   [birth year]  MR#    [id number]  ROOM:  [id number]  TIME DICTATED: 06 28 PM  ACCT#  [id number]    cc: Jerral BonitoWei Zhao MD    I hereby certify this patient for admission based upon medical necessity as  noted below:    PRIMARY CARE MANAGER:  Dr. Dion BodyZhao     EVALUATION TIME:  1335    CHIEF COMPLAINT:  Nausea, vomiting, back pain.    HISTORY OF PRESENT ILLNESS:  A 29 year old female who presents to the ER to be evaluated for feeling unwell  today.  She said she woke up this morning with nausea, vomiting and after 3 or  4 times of vomiting, started having pain in her upper back that is worse with  position change, is nonpleuritic.  She does not feel short of breath.  Her  pain persisted and nausea persisted.  She decided to come to the ER and be  evaluated.  She has not had diarrhea, fever or dyspnea.    REVIEW OF SYSTEMS:  CONSTITUTIONAL:  Denies fever or chills.    ENT:  No sore throat, runny nose or other URI symptoms.  RESPIRATORY:  No cough, shortness of breath or wheezing.  CARDIOVASCULAR:  No chest pain, chest pressure or palpitations.  GASTROINTESTINAL:  Vomiting.  No abdominal pain, no diarrhea.  GENITOURINARY:  No dysuria, frequency or urgency.  MUSCULOSKELETAL:  Back pain, worse with movement.  NEUROLOGICAL:  No headaches, sensory or motor symptoms.    PAST MEDICAL HISTORY:  Includes gastroparesis, diabetes, hypertension.    SOCIAL HISTORY:  Nonsmoker.    FAMILY HISTORY:  Not known.    ALLERGIES:  PENICILLIN, ZITHROMAX.    MEDICATIONS:  Apidra and Lantus.    PHYSICAL EXAMINATION:  GENERAL APPEARANCE:  Patient appears well developed and well nourished.  Appearance and behavior are age and situation appropriate.    VITAL SIGNS:  Blood pressure 198/117, heart rate 111, respiratory rate 16,  temperature 98.2,  9 out of 10 pain, 100% on room air.  HEENT:  Eyes:  Conjunctivae clear, lids normal.  Pupils equal,  symmetrical,  and normally reactive.  ENT:  Mucous membranes look mildly dry,   nonerythematous.  NECK:  Supple, nontender, symmetrical, no masses or JVD, trachea midline,  thyroid not enlarged, nodular or tender.    LYMPHATIC:  No cervical or submandibular lymphadenopathy palpated.  RESPIRATORY:  Clear and equal breath sounds.  No respiratory distress,  tachypnea, or accessory muscle use.    CARDIOVASCULAR:   Heart regular, without murmurs, gallops, rubs, or thrills.    GASTROINTESTINAL:  Abdomen soft, nontender, without complaint of pain to  palpation.  No hepatomegaly or splenomegaly.    MUSCULOSKELETAL:  Spine:  There is no localized cervical, thoracic, lumbar or  sacral body tenderness to palpation or fist percussion.  There are no bony  step-offs, ecchymosis, areas of soft tissue swelling or deformities.  She is  tender across the lower thoracic muscles of the back without deformity or  stepoff.  It hurts when she tries to move, turn or twist.  She has intact  distal pulses which are symmetric.  SKIN:  No rashes.  NEUROLOGIC:  Awake, alert, interacting appropriately, nontoxic appearing.    ASSESSMENT AND MANAGEMENT PLAN:  This is a patient who has a history of gastroparesis who is having intractable  vomiting.  She  has some upper back pain that I think is likely muscular from  her vomiting.  At this point   will try to medicate to control her symptoms,  hydration.  I am going to check some labs with the back pain to rule out  atypical presentation of pancreatitis or other intraabdominal process.      DIAGNOSTIC INTERPRETATIONS:  The patient's urine showed 80 ketones, was negative for infection.  Pregnancy  test is negative.  White count is 11, hemoglobin 12 and hematocrit 39.  Lipase  116.  CMP unremarkable with the exception of glucose 223, CO2 normal.    COURSE IN EMERGENCY DEPARTMENT:  The patient was given a liter of fluids.  We gave her Phenergan and Zofran as   well as a small dose of morphine.  She continued to have nausea and was given  Reglan and another liter of fluids.  On most recent reassessment she was no  longer vomiting.  She was given a dose of Ativan.  She was going to be  discharged with Phenergan suppositories when she started vomiting again and  having increased pain.  At this point Dr. Jimmey Ralph spoke with Dr. Corinda Gubler, who is  accepting the patient to observation status for further hydration and symptom  control.      DIAGNOSES:  1.  Acute gastroparesis exacerbation.  2.  Acute vomiting.  3.  Dehydration.  4.  Thoracic strain.    DISPOSITION:  Observation.  The patient was personally evaluated by myself and Dr. Jimmey Ralph,  who agrees with the above assessment and plan.      ___________________  Johny Drilling MD  Dictated By: Marlana Salvage, PA    My signature above authenticates this document and my orders, the final  diagnosis (es), discharge prescription (s), and instructions in the [facility]  Pulsecheck record.  Nursing notes have been reviewed by the physician/mid-level provider.    If you have any questions please contact [phone].    MLT  D:02/09/2014 18:28:01  T: 02/09/2014 20:09:59  [id number]  Electronically Authenticated by:  Johny Drilling, M.D. On 02/14/2014 06:02 PM EST

## 2014-02-10 LAB — CBC WITH AUTOMATED DIFF
BASOPHILS: 0.3 % (ref 0–3)
EOSINOPHILS: 0 % (ref 0–5)
HCT: 33.2 % — ABNORMAL LOW (ref 37.0–50.0)
HGB: 11 gm/dl — ABNORMAL LOW (ref 13.0–17.2)
IMMATURE GRANULOCYTES: 0.3 % (ref 0.0–3.0)
LYMPHOCYTES: 17.9 % — ABNORMAL LOW (ref 28–48)
MCH: 29.6 pg (ref 25.4–34.6)
MCHC: 33.1 gm/dl (ref 30.0–36.0)
MCV: 89.5 fL (ref 80.0–98.0)
MONOCYTES: 3.9 % (ref 1–13)
MPV: 12 fL — ABNORMAL HIGH (ref 6.0–10.0)
NEUTROPHILS: 77.6 % — ABNORMAL HIGH (ref 34–64)
NRBC: 0 (ref 0–0)
PLATELET: 261 10*3/uL (ref 140–450)
RBC: 3.71 M/uL (ref 3.60–5.20)
RDW-SD: 39.7 (ref 36.4–46.3)
WBC: 14 10*3/uL — ABNORMAL HIGH (ref 4.0–11.0)

## 2014-02-10 LAB — HEMOGLOBIN A1C WITH EAG: Hemoglobin A1c: 8.5 % — ABNORMAL HIGH (ref 4.8–6.0)

## 2014-02-10 LAB — GLUCOSE, POC
Glucose (POC): 267 mg/dL — ABNORMAL HIGH (ref 65–105)
Glucose (POC): 199 mg/dL — ABNORMAL HIGH (ref 65–105)
Glucose (POC): 143 mg/dL — ABNORMAL HIGH (ref 65–105)
Glucose (POC): 246 mg/dL — ABNORMAL HIGH (ref 65–105)

## 2014-02-10 LAB — METABOLIC PANEL, BASIC
BUN: 15 mg/dl (ref 7–25)
CO2: 22 mEq/L (ref 21–32)
Calcium: 8.2 mg/dl — ABNORMAL LOW (ref 8.5–10.1)
Chloride: 106 mEq/L (ref 98–107)
Creatinine: 0.8 mg/dl (ref 0.6–1.3)
GFR est AA: >60
GFR est non-AA: >60
Glucose: 221 mg/dl — ABNORMAL HIGH (ref 74–106)
Potassium: 3.8 mEq/L (ref 3.5–5.1)
Sodium: 139 mEq/L (ref 136–145)

## 2014-02-10 LAB — URINALYSIS W/ RFLX MICROSCOPIC
Bilirubin: NEGATIVE
Blood: NEGATIVE
Glucose: 500 mg/dl — AB
Ketone: 40 mg/dl — AB
Leukocyte Esterase: NEGATIVE
Nitrites: NEGATIVE
Protein: 30 mg/dl — AB
Specific gravity: 1.025 (ref 1.005–1.030)
Urobilinogen: 0.2 mg/dl (ref 0.0–1.0)
pH (UA): 6 (ref 5.0–9.0)

## 2014-02-10 LAB — POC URINE MICROSCOPIC

## 2014-02-11 LAB — CBC WITH AUTOMATED DIFF
BASOPHILS: 0.4 % (ref 0–3)
EOSINOPHILS: 0 % (ref 0–5)
HCT: 41 % (ref 37.0–50.0)
HGB: 13.7 gm/dl (ref 13.0–17.2)
IMMATURE GRANULOCYTES: 0.3 % (ref 0.0–3.0)
LYMPHOCYTES: 11.8 % — ABNORMAL LOW (ref 28–48)
MCH: 29.7 pg (ref 25.4–34.6)
MCHC: 33.4 gm/dl (ref 30.0–36.0)
MCV: 88.7 fL (ref 80.0–98.0)
MONOCYTES: 2.6 % (ref 1–13)
MPV: 12 fL — ABNORMAL HIGH (ref 6.0–10.0)
NEUTROPHILS: 84.9 % — ABNORMAL HIGH (ref 34–64)
NRBC: 0 (ref 0–0)
PLATELET: 366 10*3/uL (ref 140–450)
RBC: 4.62 M/uL (ref 3.60–5.20)
RDW-SD: 40.6 (ref 36.4–46.3)
WBC: 20.3 10*3/uL — ABNORMAL HIGH (ref 4.0–11.0)

## 2014-02-11 LAB — METABOLIC PANEL, BASIC
BUN: 15 mg/dl (ref 7–25)
CO2: 19 mEq/L — ABNORMAL LOW (ref 21–32)
Calcium: 9.3 mg/dl (ref 8.5–10.1)
Chloride: 101 mEq/L (ref 98–107)
Creatinine: 1 mg/dl (ref 0.6–1.3)
GFR est AA: >60
GFR est non-AA: >60
Glucose: 218 mg/dl — ABNORMAL HIGH (ref 74–106)
Potassium: 3.8 mEq/L (ref 3.5–5.1)
Sodium: 136 mEq/L (ref 136–145)

## 2014-02-11 LAB — GLUCOSE, POC
Glucose (POC): 270 mg/dL — ABNORMAL HIGH (ref 65–105)
Glucose (POC): 223 mg/dL — ABNORMAL HIGH (ref 65–105)
Glucose (POC): 208 mg/dL — ABNORMAL HIGH (ref 65–105)
Glucose (POC): 325 mg/dL — ABNORMAL HIGH (ref 65–105)

## 2014-02-12 LAB — CBC WITH AUTOMATED DIFF
BASOPHILS: 0.3 % (ref 0–3)
EOSINOPHILS: 0 % (ref 0–5)
HCT: 31.6 % — ABNORMAL LOW (ref 37.0–50.0)
HGB: 10.4 gm/dl — ABNORMAL LOW (ref 13.0–17.2)
IMMATURE GRANULOCYTES: 0.5 % (ref 0.0–3.0)
LYMPHOCYTES: 10.5 % — ABNORMAL LOW (ref 28–48)
MCH: 29.9 pg (ref 25.4–34.6)
MCHC: 32.9 gm/dl (ref 30.0–36.0)
MCV: 90.8 fL (ref 80.0–98.0)
MONOCYTES: 4.2 % (ref 1–13)
MPV: 11.7 fL — ABNORMAL HIGH (ref 6.0–10.0)
NEUTROPHILS: 84.5 % — ABNORMAL HIGH (ref 34–64)
NRBC: 0 (ref 0–0)
PLATELET: 281 10*3/uL (ref 140–450)
RBC: 3.48 M/uL — ABNORMAL LOW (ref 3.60–5.20)
RDW-SD: 42.2 (ref 36.4–46.3)
WBC: 15.8 10*3/uL — ABNORMAL HIGH (ref 4.0–11.0)

## 2014-02-12 LAB — METABOLIC PANEL, BASIC
BUN: 20 mg/dl (ref 7–25)
CO2: 17 mEq/L — ABNORMAL LOW (ref 21–32)
Calcium: 8 mg/dl — ABNORMAL LOW (ref 8.5–10.1)
Chloride: 107 mEq/L (ref 98–107)
Creatinine: 0.9 mg/dl (ref 0.6–1.3)
GFR est AA: >60
GFR est non-AA: >60
Glucose: 286 mg/dl — ABNORMAL HIGH (ref 74–106)
Potassium: 4.2 mEq/L (ref 3.5–5.1)
Sodium: 137 mEq/L (ref 136–145)

## 2014-02-12 LAB — GLUCOSE, POC
Glucose (POC): 300 mg/dL — ABNORMAL HIGH (ref 65–105)
Glucose (POC): 301 mg/dL — ABNORMAL HIGH (ref 65–105)
Glucose (POC): 153 mg/dL — ABNORMAL HIGH (ref 65–105)
Glucose (POC): 127 mg/dL — ABNORMAL HIGH (ref 65–105)

## 2014-02-13 LAB — GLUCOSE, POC
Glucose (POC): 127 mg/dL — ABNORMAL HIGH (ref 65–105)
Glucose (POC): 104 mg/dL (ref 65–105)
Glucose (POC): 178 mg/dL — ABNORMAL HIGH (ref 65–105)
Glucose (POC): 206 mg/dL — ABNORMAL HIGH (ref 65–105)

## 2014-02-14 LAB — CBC WITH AUTOMATED DIFF
BASOPHILS: 0.5 % (ref 0–3)
EOSINOPHILS: 0 % (ref 0–5)
HCT: 37.6 % (ref 37.0–50.0)
HGB: 12.5 gm/dl — ABNORMAL LOW (ref 13.0–17.2)
IMMATURE GRANULOCYTES: 0.3 % (ref 0.0–3.0)
LYMPHOCYTES: 18 % — ABNORMAL LOW (ref 28–48)
MCH: 29.3 pg (ref 25.4–34.6)
MCHC: 33.2 gm/dl (ref 30.0–36.0)
MCV: 88.3 fL (ref 80.0–98.0)
MONOCYTES: 3.7 % (ref 1–13)
MPV: 11.8 fL — ABNORMAL HIGH (ref 6.0–10.0)
NEUTROPHILS: 77.5 % — ABNORMAL HIGH (ref 34–64)
NRBC: 0 (ref 0–0)
PLATELET: 326 10*3/uL (ref 140–450)
RBC: 4.26 M/uL (ref 3.60–5.20)
RDW-SD: 39.6 (ref 36.4–46.3)
WBC: 10.2 10*3/uL (ref 4.0–11.0)

## 2014-02-14 LAB — GLUCOSE, POC
Glucose (POC): 169 mg/dL — ABNORMAL HIGH (ref 65–105)
Glucose (POC): 274 mg/dL — ABNORMAL HIGH (ref 65–105)
Glucose (POC): 198 mg/dL — ABNORMAL HIGH (ref 65–105)
Glucose (POC): 240 mg/dL — ABNORMAL HIGH (ref 65–105)

## 2014-02-14 LAB — METABOLIC PANEL, BASIC
BUN: 11 mg/dl (ref 7–25)
CO2: 19 mEq/L — ABNORMAL LOW (ref 21–32)
Calcium: 8.7 mg/dl (ref 8.5–10.1)
Chloride: 98 mEq/L (ref 98–107)
Creatinine: 0.9 mg/dl (ref 0.6–1.3)
GFR est AA: >60
GFR est non-AA: >60
Glucose: 305 mg/dl — ABNORMAL HIGH (ref 74–106)
Potassium: 3.4 mEq/L — ABNORMAL LOW (ref 3.5–5.1)
Sodium: 134 mEq/L — ABNORMAL LOW (ref 136–145)

## 2014-02-14 LAB — MAGNESIUM: Magnesium: 1.6 mg/dl — ABNORMAL LOW (ref 1.8–2.4)

## 2014-02-15 LAB — METABOLIC PANEL, BASIC
BUN: 13 mg/dl (ref 7–25)
CO2: 22 mEq/L (ref 21–32)
Calcium: 8.5 mg/dl (ref 8.5–10.1)
Chloride: 102 mEq/L (ref 98–107)
Creatinine: 1 mg/dl (ref 0.6–1.3)
GFR est AA: >60
GFR est non-AA: >60
Glucose: 288 mg/dl — ABNORMAL HIGH (ref 74–106)
Potassium: 3.2 mEq/L — ABNORMAL LOW (ref 3.5–5.1)
Sodium: 134 mEq/L — ABNORMAL LOW (ref 136–145)

## 2014-02-15 LAB — GLUCOSE, POC
Glucose (POC): 154 mg/dL — ABNORMAL HIGH (ref 65–105)
Glucose (POC): 265 mg/dL — ABNORMAL HIGH (ref 65–105)
Glucose (POC): 254 mg/dL — ABNORMAL HIGH (ref 65–105)
Glucose (POC): 139 mg/dL — ABNORMAL HIGH (ref 65–105)

## 2014-02-15 LAB — CBC WITH AUTOMATED DIFF
BASOPHILS: 0.4 % (ref 0–3)
EOSINOPHILS: 0 % (ref 0–5)
HCT: 37.6 % (ref 37.0–50.0)
HGB: 12.6 gm/dl — ABNORMAL LOW (ref 13.0–17.2)
IMMATURE GRANULOCYTES: 0.4 % (ref 0.0–3.0)
LYMPHOCYTES: 23.8 % — ABNORMAL LOW (ref 28–48)
MCH: 29 pg (ref 25.4–34.6)
MCHC: 33.5 gm/dl (ref 30.0–36.0)
MCV: 86.4 fL (ref 80.0–98.0)
MONOCYTES: 5.1 % (ref 1–13)
MPV: 10.9 fL — ABNORMAL HIGH (ref 6.0–10.0)
NEUTROPHILS: 70.3 % — ABNORMAL HIGH (ref 34–64)
NRBC: 0 (ref 0–0)
PLATELET: 361 10*3/uL (ref 140–450)
RBC: 4.35 M/uL (ref 3.60–5.20)
RDW-SD: 39.7 (ref 36.4–46.3)
WBC: 10.4 10*3/uL (ref 4.0–11.0)

## 2014-02-15 LAB — MAGNESIUM: Magnesium: 1.9 mg/dl (ref 1.8–2.4)

## 2014-02-16 LAB — METABOLIC PANEL, BASIC
BUN: 9 mg/dl (ref 7–25)
CO2: 24 mEq/L (ref 21–32)
Calcium: 8.1 mg/dl — ABNORMAL LOW (ref 8.5–10.1)
Chloride: 104 mEq/L (ref 98–107)
Creatinine: 0.8 mg/dl (ref 0.6–1.3)
GFR est AA: >60
GFR est non-AA: >60
Glucose: 182 mg/dl — ABNORMAL HIGH (ref 74–106)
Potassium: 3 mEq/L — ABNORMAL LOW (ref 3.5–5.1)
Sodium: 138 mEq/L (ref 136–145)

## 2014-02-16 LAB — GLUCOSE, POC
Glucose (POC): 180 mg/dL — ABNORMAL HIGH (ref 65–105)
Glucose (POC): 181 mg/dL — ABNORMAL HIGH (ref 65–105)
Glucose (POC): 191 mg/dL — ABNORMAL HIGH (ref 65–105)
Glucose (POC): 145 mg/dL — ABNORMAL HIGH (ref 65–105)

## 2014-02-16 LAB — MAGNESIUM: Magnesium: 1.7 mg/dl — ABNORMAL LOW (ref 1.8–2.4)

## 2014-03-11 LAB — METABOLIC PANEL, COMPREHENSIVE
ALT (SGPT): 18 U/L (ref 12–78)
AST (SGOT): 12 U/L — ABNORMAL LOW (ref 15–37)
Albumin: 3.6 gm/dl (ref 3.4–5.0)
Alk. phosphatase: 96 U/L (ref 45–117)
BUN: 13 mg/dl (ref 7–25)
Bilirubin, total: 0.4 mg/dl (ref 0.2–1.0)
CO2: 22 mEq/L (ref 21–32)
Calcium: 8.1 mg/dl — ABNORMAL LOW (ref 8.5–10.1)
Chloride: 107 mEq/L (ref 98–107)
Creatinine: 0.8 mg/dl (ref 0.6–1.3)
GFR est AA: >60
GFR est non-AA: >60
Glucose: 133 mg/dl — ABNORMAL HIGH (ref 74–106)
Potassium: 3.7 mEq/L (ref 3.5–5.1)
Protein, total: 7.7 gm/dl (ref 6.4–8.2)
Sodium: 140 mEq/L (ref 136–145)

## 2014-03-11 LAB — CBC WITH AUTOMATED DIFF
BASOPHILS: 0.2 % (ref 0–3)
EOSINOPHILS: 0.8 % (ref 0–5)
HCT: 37.5 % (ref 37.0–50.0)
HGB: 12.3 gm/dl — ABNORMAL LOW (ref 13.0–17.2)
IMMATURE GRANULOCYTES: 0.4 % (ref 0.0–3.0)
LYMPHOCYTES: 7.4 % — ABNORMAL LOW (ref 28–48)
MCH: 28.7 pg (ref 25.4–34.6)
MCHC: 32.8 gm/dl (ref 30.0–36.0)
MCV: 87.6 fL (ref 80.0–98.0)
MONOCYTES: 2.6 % (ref 1–13)
MPV: 11.4 fL — ABNORMAL HIGH (ref 6.0–10.0)
NEUTROPHILS: 88.6 % — ABNORMAL HIGH (ref 34–64)
NRBC: 0 (ref 0–0)
PLATELET: 264 10*3/uL (ref 140–450)
RBC: 4.28 M/uL (ref 3.60–5.20)
RDW-SD: 40.7 (ref 36.4–46.3)
WBC: 15.6 10*3/uL — ABNORMAL HIGH (ref 4.0–11.0)

## 2014-03-11 LAB — POC URINE MACROSCOPIC
Bilirubin: NEGATIVE
Blood: NEGATIVE
Glucose: 100 mg/dl — AB
Ketone: NEGATIVE mg/dl
Leukocyte Esterase: NEGATIVE
Nitrites: NEGATIVE
Protein: 100 mg/dl — AB
Specific gravity: 1.025 (ref 1.005–1.030)
Urobilinogen: 1 EU/dl (ref 0.0–1.0)
pH (UA): 7.5 (ref 5–9)

## 2014-03-11 LAB — POC HCG,URINE: HCG urine, QL: NEGATIVE

## 2014-03-11 LAB — HEMOGLOBIN A1C WITH EAG: Hemoglobin A1c: 7.9 % — ABNORMAL HIGH (ref 4.8–6.0)

## 2014-03-11 NOTE — ED Provider Notes (Addendum)
[facility]  EMERGENCY [facility] TREATMENT REPORT  NAME:  [name] SprinklesMOORE, [name]  SEX:   F  ADMIT: 03/11/2014  DOB:   16-Jul-1985  MR#    [id number]  ROOM:  [id number]  TIME DICTATED: 02 23 PM  ACCT#  [id number]    cc: Jerral BonitoWei Zhao MD    I hereby certify this patient for admission based upon medical necessity as  noted below:      PRIMARY CARE PHYSICIAN:  Jerral BonitoWei Zhao, MD     EVALUATION TIME:  10:35 a.m.    CHIEF COMPLAINT:  Abdominal pain, nausea and vomiting.    HISTORY OF PRESENT ILLNESS:  This is a 29 year old female who comes in stating that she woke up this  morning and started vomiting, multiply at first.  It was food, then bile and  now she is kind of dry heaving.  She has had some back pain from the vomiting  and some burning from the vomiting in her chest.  She has a history of  gastroparesis.  This feels like her gastroparesis.  She is not having severe  abdominal pain, denies urinary symptoms.    REVIEW OF SYSTEMS:  CONSTITUTIONAL:  No fevers, no chills.  ENT:  No sore throat, runny nose or other URI symptoms.   RESPIRATORY:  No shortness of breath, cough or wheezing.   CARDIOVASCULAR:  No chest pain, pressure or palpitations.   MUSCULOSKELETAL:  Back pain.  GASTROINTESTINAL:  Vomiting, no diarrhea.  GENITOURINARY:  No dysuria, frequency or urgency.   INTEGUMENTARY:  No rashes.   NEUROLOGICAL:  No headaches, sensory or motor symptoms.     PAST MEDICAL HISTORY:  Diabetes, gastroparesis and hypertension.    SOCIAL HISTORY:  She does not smoke or drink alcohol.    FAMILY HISTORY:  Not known.    ALLERGIES:  PENICILLIN AND ZITHROMAX.    MEDICATIONS:  Listed and reviewed in [facility].    PHYSICAL EXAMINATION:  GENERAL APPEARANCE:  The patient appears comfortable but intermittently is  kind of retching into a bag.  HEENT:  Her pupils are equal, symmetrical and reactive.  Her mucous membranes  look a little dry, nonerythematous.  NECK:  Supple, nontender, symmetrical, no masses or JVD, trachea midline,   thyroid not enlarged, nodular or tender.   LYMPHATICS:  No cervical or submandibular lymphadenopathy palpated.    RESPIRATORY:  Clear and equal breath sounds.  No respiratory distress,  tachypnea or accessory muscle use.   CARDIOVASCULAR:   Heart regular, without murmurs, gallops, rubs or thrills.   GASTROINTESTINAL:  Abdomen soft, nontender, without complaint of pain to  palpation.  No hepatomegaly or splenomegaly.   MUSCULOSKELETAL:  The patient has pain across her thoracic upper back,  reproducible on palpation with movement.  SKIN:  No rashes.  NEUROLOGIC:  Awake, alert, interacts appropriately and is nontoxic appearing.  PSYCHIATRIC:  Recent and remote memory appears to be intact.     CONTINUATION BY Orma FlamingOBERT Toyoko Silos, MD:    INITIAL ASSESSMENT AND MANAGEMENT PLAN:  This is a new problem for this patient.     DIAGNOSTIC STUDIES:  The following were obtained:  Comprehensive metabolic panel:  Glucose 133,  otherwise normal.  Hepatic function panel normal.   Pregnancy test negative.  CBC:  White blood cell count 15.6, hemoglobin 12.3, hematocrit 37.5, platelet  count normal.   Urinalysis normal.      BRIEF EMERGENCY DEPARTMENT COURSE:  The patient remained stable throughout her stay in the Emergency Department.  She has had  ongoing vomiting.  I have examined her myself.  Her abdomen is  soft, really very minimal tenderness.  The patient has been treated with IV  morphine, IV Phenergan, IV Reglan and IV Zofran, and continues to have dry  heaves and retching.  At this point I have spoken to Dr. Rosana Hoes, and we are  going to bring her in for ongoing treatment to control her vomiting.     CLINICAL IMPRESSION AND DIAGNOSES:   1.  Acute on chronic abdominal pain.  2.  Intractable vomiting.     DISPOSITION AND PLAN:  Admitted to observation status to Dr. Rosana Hoes.      ___________________  Posey Pronto MD  Dictated By: Marlana Salvage, PA    My signature above authenticates this document and my orders, the final   diagnosis (es), discharge prescription (s), and instructions in the [facility]  Pulsecheck record.  Nursing notes have been reviewed by the physician/mid-level provider.    If you have any questions please contact [phone].    NS  D:03/11/2014 14:23:26  T: 03/11/2014 14:39:40  [id number]  Electronically Authenticated by:  Posey Pronto, M.D. On 03/13/2014 08:47 AM EST

## 2014-03-12 LAB — METABOLIC PANEL, BASIC
BUN: 14 mg/dl (ref 7–25)
CO2: 19 mEq/L — ABNORMAL LOW (ref 21–32)
Calcium: 8.2 mg/dl — ABNORMAL LOW (ref 8.5–10.1)
Chloride: 103 mEq/L (ref 98–107)
Creatinine: 1.1 mg/dl (ref 0.6–1.3)
GFR est AA: >60
GFR est non-AA: >60
Glucose: 310 mg/dl — ABNORMAL HIGH (ref 74–106)
Potassium: 4.1 mEq/L (ref 3.5–5.1)
Sodium: 133 mEq/L — ABNORMAL LOW (ref 136–145)

## 2014-03-12 LAB — CBC WITH AUTOMATED DIFF
BASOPHILS: 0.3 % (ref 0–3)
EOSINOPHILS: 0 % (ref 0–5)
HCT: 37.5 % (ref 37.0–50.0)
HGB: 12.4 gm/dl — ABNORMAL LOW (ref 13.0–17.2)
IMMATURE GRANULOCYTES: 0.5 % (ref 0.0–3.0)
LYMPHOCYTES: 7.7 % — ABNORMAL LOW (ref 28–48)
MCH: 28.7 pg (ref 25.4–34.6)
MCHC: 33.1 gm/dl (ref 30.0–36.0)
MCV: 86.8 fL (ref 80.0–98.0)
MONOCYTES: 2.8 % (ref 1–13)
MPV: 12 fL — ABNORMAL HIGH (ref 6.0–10.0)
NEUTROPHILS: 88.7 % — ABNORMAL HIGH (ref 34–64)
NRBC: 0 (ref 0–0)
PLATELET: 258 10*3/uL (ref 140–450)
RBC: 4.32 M/uL (ref 3.60–5.20)
RDW-SD: 39.8 (ref 36.4–46.3)
WBC: 13.2 10*3/uL — ABNORMAL HIGH (ref 4.0–11.0)

## 2014-03-12 LAB — GLUCOSE, POC
Glucose (POC): 307 mg/dL — ABNORMAL HIGH (ref 65–105)
Glucose (POC): 240 mg/dL — ABNORMAL HIGH (ref 65–105)
Glucose (POC): 353 mg/dL — ABNORMAL HIGH (ref 65–105)
Glucose (POC): 208 mg/dL — ABNORMAL HIGH (ref 65–105)
Glucose (POC): 229 mg/dL — ABNORMAL HIGH (ref 65–105)

## 2014-03-13 LAB — METABOLIC PANEL, BASIC
BUN: 10 mg/dl (ref 7–25)
CO2: 23 mEq/L (ref 21–32)
Calcium: 8.7 mg/dl (ref 8.5–10.1)
Chloride: 102 mEq/L (ref 98–107)
Creatinine: 0.8 mg/dl (ref 0.6–1.3)
GFR est AA: >60
GFR est non-AA: >60
Glucose: 216 mg/dl — ABNORMAL HIGH (ref 74–106)
Potassium: 3.9 mEq/L (ref 3.5–5.1)
Sodium: 135 mEq/L — ABNORMAL LOW (ref 136–145)

## 2014-03-13 LAB — GLUCOSE, POC
Glucose (POC): 249 mg/dL — ABNORMAL HIGH (ref 65–105)
Glucose (POC): 270 mg/dL — ABNORMAL HIGH (ref 65–105)
Glucose (POC): 202 mg/dL — ABNORMAL HIGH (ref 65–105)
Glucose (POC): 283 mg/dL — ABNORMAL HIGH (ref 65–105)

## 2014-03-13 LAB — CBC WITH AUTOMATED DIFF
BASOPHILS: 0.2 % (ref 0–3)
EOSINOPHILS: 0 % (ref 0–5)
HCT: 36.5 % — ABNORMAL LOW (ref 37.0–50.0)
HGB: 12.5 gm/dl — ABNORMAL LOW (ref 13.0–17.2)
IMMATURE GRANULOCYTES: 0.3 % (ref 0.0–3.0)
LYMPHOCYTES: 8.9 % — ABNORMAL LOW (ref 28–48)
MCH: 29.5 pg (ref 25.4–34.6)
MCHC: 34.2 gm/dl (ref 30.0–36.0)
MCV: 86.1 fL (ref 80.0–98.0)
MONOCYTES: 4.2 % (ref 1–13)
MPV: 11.3 fL — ABNORMAL HIGH (ref 6.0–10.0)
NEUTROPHILS: 86.4 % — ABNORMAL HIGH (ref 34–64)
NRBC: 0 (ref 0–0)
PLATELET: 296 10*3/uL (ref 140–450)
RBC: 4.24 M/uL (ref 3.60–5.20)
RDW-SD: 40 (ref 36.4–46.3)
WBC: 16.8 10*3/uL — ABNORMAL HIGH (ref 4.0–11.0)

## 2014-03-14 LAB — GLUCOSE, POC
Glucose (POC): 249 mg/dL — ABNORMAL HIGH (ref 65–105)
Glucose (POC): 322 mg/dL — ABNORMAL HIGH (ref 65–105)

## 2014-03-15 LAB — POC URINE MACROSCOPIC
Blood: NEGATIVE
Glucose: 250 mg/dl — AB
Ketone: 80 mg/dl — AB
Leukocyte Esterase: NEGATIVE
Nitrites: NEGATIVE
Protein: 100 mg/dl — AB
Specific gravity: >=1.03 (ref 1.005–1.030)
Urobilinogen: >=8 EU/dl (ref 0.0–1.0)
pH (UA): 6 (ref 5–9)

## 2014-03-15 LAB — CBC WITH AUTOMATED DIFF
BASOPHILS: 0.4 % (ref 0–3)
EOSINOPHILS: 0 % (ref 0–5)
HCT: 37.4 % (ref 37.0–50.0)
HGB: 12.9 gm/dl — ABNORMAL LOW (ref 13.0–17.2)
IMMATURE GRANULOCYTES: 0.4 % (ref 0.0–3.0)
LYMPHOCYTES: 23 % — ABNORMAL LOW (ref 28–48)
MCH: 29.3 pg (ref 25.4–34.6)
MCHC: 34.5 gm/dl (ref 30.0–36.0)
MCV: 85 fL (ref 80.0–98.0)
MONOCYTES: 5.4 % (ref 1–13)
MPV: 11.9 fL — ABNORMAL HIGH (ref 6.0–10.0)
NEUTROPHILS: 70.8 % — ABNORMAL HIGH (ref 34–64)
NRBC: 0 (ref 0–0)
PLATELET: 333 10*3/uL (ref 140–450)
RBC: 4.4 M/uL (ref 3.60–5.20)
RDW-SD: 38.8 (ref 36.4–46.3)
WBC: 13.4 10*3/uL — ABNORMAL HIGH (ref 4.0–11.0)

## 2014-03-15 LAB — METABOLIC PANEL, COMPREHENSIVE
ALT (SGPT): 13 U/L (ref 12–78)
AST (SGOT): 9 U/L — ABNORMAL LOW (ref 15–37)
Albumin: 3.9 gm/dl (ref 3.4–5.0)
Alk. phosphatase: 90 U/L (ref 45–117)
BUN: 16 mg/dl (ref 7–25)
Bilirubin, total: 0.7 mg/dl (ref 0.2–1.0)
CO2: 22 mEq/L (ref 21–32)
Calcium: 9.1 mg/dl (ref 8.5–10.1)
Chloride: 100 mEq/L (ref 98–107)
Creatinine: 1 mg/dl (ref 0.6–1.3)
GFR est AA: >60
GFR est non-AA: >60
Glucose: 211 mg/dl — ABNORMAL HIGH (ref 74–106)
Potassium: 3.4 mEq/L — ABNORMAL LOW (ref 3.5–5.1)
Protein, total: 8 gm/dl (ref 6.4–8.2)
Sodium: 134 mEq/L — ABNORMAL LOW (ref 136–145)

## 2014-03-15 LAB — LIPASE: Lipase: 194 U/L (ref 73–393)

## 2014-03-15 LAB — POC HCG,URINE: HCG urine, QL: NEGATIVE

## 2014-03-15 NOTE — ED Provider Notes (Addendum)
[facility]  EMERGENCY DEPARTMENT TREATMENT REPORT  NAME:  [name] [name]  SEX:   F  ADMIT: 03/15/2014  DOB:   Jun 12, 1985  MR#    [id number]  ROOM:    TIME DICTATED: 04 39 AM  ACCT#  [id number]    cc: Jerral Bonito MD    CHIEF COMPLAINT:  Vomiting.    HISTORY OF PRESENT ILLNESS:  A 29 year old who presents with vomiting.  She states it started 4 days ago.  She was admitted 3 days ago to the hospital and was discharged this morning.  She presents with ongoing vomiting.  Complains of epigastric abdominal pain.  She rates her pain as an 8 on a scale of 1 to 10 with radiation to the back,  sharp.  No relief with her medications that she has at home.    REVIEW OF SYSTEMS:  CONSTITUTIONAL:  No fever.  ENT:  No sore throat, no congestion.  RESPIRATORY:  No difficulty breathing.  CARDIOVASCULAR:  No chest pain.  GASTROINTESTINAL:  Positive vomiting, no hematemesis, no diarrhea.  GENITOURINARY:  Decreased urination.  MUSCULOSKELETAL:  No leg pain.  INTEGUMENTARY:  No rash.  HEMATOLOGICAL:  No hematemesis.  NEUROLOGICAL:  No dizziness.    PAST MEDICAL HISTORY:  The patient has a history of type 1 diabetes, gastroparesis, hypertension.    SOCIAL HISTORY:  Denies tobacco.    FAMILY HISTORY:  Positive for diabetes.    PHYSICAL EXAMINATION:  VITAL SIGNS:  Blood pressure 173/103, pulse 116, respirations 16, temperature  98.4.  GENERAL:  Well-developed, well-nourished female, alert.  HEENT:  Eyes:  Conjunctivae clear, lids normal.  Pupils equal, symmetrical and  normally reactive.  Mouth/Throat:  Surfaces of the pharynx, palate and tongue  are pink, moist and without lesions.   NECK:  Supple, nontender, symmetrical, no masses or JVD, trachea midline,  thyroid not enlarged, nodular or tender.   LYMPHATICS:  No cervical or submandibular lymphadenopathy palpated.    HEART:  Sinus rhythm, no murmurs heard.  LUNGS:  Clear, no wheezing, no retractions, no respiratory distress.   ABDOMEN:  Soft, no tenderness on exam.  No guarding, no rebound, no mass  palpable.  SKIN:  No rash.  NEUROLOGIC:  The patient is alert, answering questions appropriately, no focal  deficits.    INITIAL ASSESSMENT AND MANAGEMENT PLAN:  This is a patient who presents for vomiting, complaints of abdominal pain as  well, which is her usual with her gastroparesis.  We will hydrate her and  treat her symptoms.    COURSE IN THE EMERGENCY DEPARTMENT:  Records reviewed.  The patient here frequently for same complaints.  Hospitalization was 02/13 through 02/16.  She had some leukocytosis thought to  be related to stress with the recurrent vomiting.  She was discharged with  promethazine in addition to oxycodone and her usual medications.      CONTINUATION BY DIANA A. HOULE, PA-C:    DIAGNOSTIC INTERPRETATION:  CBC obtained:  White count 13.4, hemoglobin 12.9, hematocrit 37.4.  Pregnancy  was negative.  Urine positive for ketones, otherwise negative.  Chemistry  obtained.  Bicarbonate normal at 22, glucose 211.  Sodium 134.    EMERGENCY DEPARTMENT COURSE:  The patient given some IV fluids, given Benadryl, Reglan IV, a total of 2 L.  Continued to complain of some abdominal discomfort, was given Ativan IV.  Repeat evaluation at 0430, she is feeling better, no distress.  Heart rate  still elevated at 112.  In looking at her discharge  summary from this morning,  she had a heart rate of 114 on discharge and on prior ED visits, she has been  tachycardic as well, usually greater than 110.  This seems to be usual for  her.  She is in no distress.  She is not acidotic.    DIAGNOSES:  1.  Vomiting.  2.  Dehydration.    3.  History of gastroparesis.    DISPOSITION:  The patient discharged.  Follow up with her primary care doctor, Dr. Dion BodyZhao as  well as GI.  Continue current medications.      The patient evaluated by myself and Dr. Luciano CutterHahn who agrees with above assessment  and plan.      ___________________  Candace CruiseEmily A Lasya Vetter MD   Dictated By: Windell Hummingbirdiana A. Houle, PA    My signature above authenticates this document and my orders, the final  diagnosis (es), discharge prescription (s), and instructions in the [facility]  Pulsecheck record.  Nursing notes have been reviewed by the physician/mid-level provider.    If you have any questions please contact [phone].    [facility] HospitalLH  D:03/15/2014 04:39:05  T: 03/15/2014 05:00:35  [id number]  Electronically Authenticated by:  Lyman SpellerEmily A. Luciano CutterHahn, M.D. On 03/21/2014 09:47 PM EST

## 2014-04-05 ENCOUNTER — Inpatient Hospital Stay
Admit: 2014-04-05 | Discharge: 2014-04-07 | Disposition: A | Discharge status: Home / Self Care | Payer: MEDICAID | Attending: Hospitalist | Admitting: Hospitalist

## 2014-04-05 DIAGNOSIS — E1010 Type 1 diabetes mellitus with ketoacidosis without coma: Principal | ICD-10-CM

## 2014-04-05 LAB — CBC WITH AUTOMATED DIFF
BASOPHILS: 0.3 % (ref 0–3)
EOSINOPHILS: 0 % (ref 0–5)
HCT: 38.4 % (ref 37.0–50.0)
HGB: 13 gm/dl (ref 13.0–17.2)
IMMATURE GRANULOCYTES: 0.4 % (ref 0.0–3.0)
LYMPHOCYTES: 10.2 % — ABNORMAL LOW (ref 28–48)
MCH: 29.4 pg (ref 25.4–34.6)
MCHC: 33.9 gm/dl (ref 30.0–36.0)
MCV: 86.9 fL (ref 80.0–98.0)
MONOCYTES: 3.2 % (ref 1–13)
MPV: 11.4 fL — ABNORMAL HIGH (ref 6.0–10.0)
NEUTROPHILS: 85.9 % — ABNORMAL HIGH (ref 34–64)
NRBC: 0 (ref 0–0)
PLATELET: 264 10*3/uL (ref 140–450)
RBC: 4.42 M/uL (ref 3.60–5.20)
RDW-SD: 41.3 (ref 36.4–46.3)
WBC: 18.5 10*3/uL — ABNORMAL HIGH (ref 4.0–11.0)

## 2014-04-05 LAB — POC URINE MACROSCOPIC
Blood: NEGATIVE
Glucose: >=1000 mg/dl — AB
Ketone: >=160 mg/dl — AB
Nitrites: NEGATIVE
Protein: 100 mg/dl — AB
Specific gravity: >=1.03 (ref 1.005–1.030)
Urobilinogen: 1 EU/dl (ref 0.0–1.0)
pH (UA): 6 (ref 5–9)

## 2014-04-05 LAB — POC CHEM8
BUN: 26 mg/dl — ABNORMAL HIGH (ref 7–25)
CALCIUM,IONIZED: 4.9 mg/dL (ref 4.40–5.40)
CO2, TOTAL: 16 mmol/L — ABNORMAL LOW (ref 21–32)
Chloride: 104 mEq/L (ref 98–107)
Creatinine: 0.9 mg/dl (ref 0.6–1.3)
Glucose: 269 mg/dL — ABNORMAL HIGH (ref 74–106)
HCT: 41 % (ref 38–45)
HGB: 13.9 gm/dl (ref 13.0–17.2)
Potassium: 3.9 mEq/L (ref 3.5–4.9)
Sodium: 138 mEq/L (ref 136–145)

## 2014-04-05 LAB — GLUCOSE, POC
Glucose (POC): 154 mg/dL — ABNORMAL HIGH (ref 65–105)
Glucose (POC): 92 mg/dL (ref 65–105)
Glucose (POC): 194 mg/dL — ABNORMAL HIGH (ref 65–105)
Glucose (POC): 337 mg/dL — ABNORMAL HIGH (ref 65–105)
Glucose (POC): 293 mg/dL — ABNORMAL HIGH (ref 65–105)
Glucose (POC): 299 mg/dL — ABNORMAL HIGH (ref 65–105)

## 2014-04-05 LAB — POC BLOOD GAS + LACTIC ACID
BASE EXCESS: -5 mmol/L — ABNORMAL LOW (ref <=3)
BICARBONATE: 20 mmol/L — ABNORMAL LOW (ref 22.0–26.0)
CO2, TOTAL: 21 mmol/L (ref 21–32)
Lactic Acid: 2.06 mmol/L — ABNORMAL HIGH (ref 0.40–2.00)
O2 SAT: 78 % — ABNORMAL HIGH (ref 70–75)
PCO2: 33.4 mm Hg — ABNORMAL LOW (ref 41.0–51.0)
PO2: 43 mm Hg — ABNORMAL HIGH (ref 35–40)
pH: 7.385 (ref 7.310–7.410)

## 2014-04-05 LAB — HEPATIC FUNCTION PANEL
ALT (SGPT): 16 U/L (ref 12–78)
AST (SGOT): 13 U/L — ABNORMAL LOW (ref 15–37)
Albumin: 4 gm/dl (ref 3.4–5.0)
Alk. phosphatase: 109 U/L (ref 45–117)
Bilirubin, direct: 0.3 mg/dl — ABNORMAL HIGH (ref 0.0–0.2)
Bilirubin, total: 1.1 mg/dl — ABNORMAL HIGH (ref 0.2–1.0)
Protein, total: 8.4 gm/dl — ABNORMAL HIGH (ref 6.4–8.2)

## 2014-04-05 LAB — METABOLIC PANEL, BASIC
BUN: 17 mg/dl (ref 7–25)
CO2: 15 mEq/L — ABNORMAL LOW (ref 21–32)
Calcium: 8.3 mg/dl — ABNORMAL LOW (ref 8.5–10.1)
Chloride: 105 mEq/L (ref 98–107)
Creatinine: 0.9 mg/dl (ref 0.6–1.3)
GFR est AA: >60
GFR est non-AA: >60
Glucose: 349 mg/dl — ABNORMAL HIGH (ref 74–106)
Potassium: 4 mEq/L (ref 3.5–5.1)
Sodium: 135 mEq/L — ABNORMAL LOW (ref 136–145)

## 2014-04-05 LAB — POC URINE MICROSCOPIC

## 2014-04-05 LAB — POC HCG,URINE: HCG urine, QL: NEGATIVE

## 2014-04-05 LAB — MAGNESIUM
Magnesium: 2 mg/dl (ref 1.8–2.4)
Magnesium: 1.4 mg/dl — ABNORMAL LOW (ref 1.8–2.4)

## 2014-04-05 LAB — HEMOGLOBIN A1C WITH EAG: Hemoglobin A1c: 8.6 % — ABNORMAL HIGH (ref 4.8–6.0)

## 2014-04-05 LAB — HCG URINE, QL: HCG urine, QL: NEGATIVE

## 2014-04-05 LAB — LIPASE: Lipase: 80 U/L (ref 73–393)

## 2014-04-05 MED ORDER — ONDANSETRON (PF) 4 MG/2 ML INJECTION
4 mg/2 mL | Freq: Four times a day (QID) | INTRAMUSCULAR | Status: DC | PRN
Start: 2014-04-05 — End: 2014-04-07
  Administered 2014-04-06: 08:00:00 via INTRAVENOUS

## 2014-04-05 MED ORDER — NS WITH POTASSIUM CHLORIDE 20 MEQ/L IV
20 mEq/L | INTRAVENOUS | Status: DC
Start: 2014-04-05 — End: 2014-04-06

## 2014-04-05 MED ORDER — CLINDAMYCIN IN D5W 600 MG/50 ML IV PIGGY BACK
600 mg/50 mL | INTRAVENOUS | Status: AC
Start: 2014-04-05 — End: 2014-04-06
  Administered 2014-04-05: 14:00:00 via INTRAVENOUS

## 2014-04-05 MED ORDER — METOCLOPRAMIDE 5 MG/ML IJ SOLN
5 mg/mL | Freq: Four times a day (QID) | INTRAMUSCULAR | Status: DC
Start: 2014-04-05 — End: 2014-04-07
  Administered 2014-04-05 – 2014-04-07 (×7): via INTRAVENOUS

## 2014-04-05 MED ORDER — MORPHINE 2 MG/ML INJECTION
2 mg/mL | INTRAMUSCULAR | Status: DC | PRN
Start: 2014-04-05 — End: 2014-04-06
  Administered 2014-04-06 – 2014-04-07 (×4): via INTRAVENOUS

## 2014-04-05 MED ORDER — SODIUM CHLORIDE 0.9 % IV
INTRAVENOUS | Status: AC
Start: 2014-04-05 — End: 2014-04-05
  Administered 2014-04-05: 14:00:00 via INTRAVENOUS

## 2014-04-05 MED ORDER — MORPHINE 4 MG/ML SYRINGE
4 mg/mL | INTRAMUSCULAR | Status: AC
Start: 2014-04-05 — End: 2014-04-05
  Administered 2014-04-05: 21:00:00 via INTRAVENOUS

## 2014-04-05 MED ORDER — SODIUM CHLORIDE 0.9 % IV
100 unit/mL | INTRAVENOUS | Status: DC
Start: 2014-04-05 — End: 2014-04-06
  Administered 2014-04-05 – 2014-04-06 (×13): via INTRAVENOUS

## 2014-04-05 MED ORDER — ONDANSETRON (PF) 4 MG/2 ML INJECTION
4 mg/2 mL | INTRAMUSCULAR | Status: DC | PRN
Start: 2014-04-05 — End: 2014-04-05

## 2014-04-05 MED ORDER — ONDANSETRON (PF) 4 MG/2 ML INJECTION
4 mg/2 mL | Freq: Once | INTRAMUSCULAR | Status: AC
Start: 2014-04-05 — End: 2014-04-05
  Administered 2014-04-05: 13:00:00 via INTRAVENOUS

## 2014-04-05 MED ORDER — ONDANSETRON (PF) 4 MG/2 ML INJECTION
4 mg/2 mL | Freq: Four times a day (QID) | INTRAMUSCULAR | Status: DC | PRN
Start: 2014-04-05 — End: 2014-04-05

## 2014-04-05 MED ORDER — D5-1/2 NS & POTASSIUM CHLORIDE 20 MEQ/L IV
20 mEq/L | INTRAVENOUS | Status: DC
Start: 2014-04-05 — End: 2014-04-05
  Administered 2014-04-05 (×2): via INTRAVENOUS

## 2014-04-05 MED ORDER — INSULIN REGULAR HUMAN 100 UNIT/ML INJECTION
100 unit/mL | Freq: Once | INTRAMUSCULAR | Status: AC
Start: 2014-04-05 — End: 2014-04-05
  Administered 2014-04-05: 23:00:00 via INTRAVENOUS

## 2014-04-05 MED ORDER — CLINDAMYCIN IN D5W 600 MG/50 ML IV PIGGY BACK
600 mg/50 mL | Freq: Three times a day (TID) | INTRAVENOUS | Status: DC
Start: 2014-04-05 — End: 2014-04-07
  Administered 2014-04-05 – 2014-04-07 (×5): via INTRAVENOUS

## 2014-04-05 MED ORDER — D5-1/2 NS & POTASSIUM CHLORIDE 20 MEQ/L IV
20 mEq/L | INTRAVENOUS | Status: DC
Start: 2014-04-05 — End: 2014-04-06
  Administered 2014-04-05 – 2014-04-06 (×4): via INTRAVENOUS

## 2014-04-05 MED ORDER — MORPHINE 4 MG/ML SYRINGE
4 mg/mL | INTRAMUSCULAR | Status: AC
Start: 2014-04-05 — End: 2014-04-05
  Administered 2014-04-05: 13:00:00 via INTRAVENOUS

## 2014-04-05 MED ORDER — SODIUM CHLORIDE 0.9% BOLUS IV
0.9 % | INTRAVENOUS | Status: AC
Start: 2014-04-05 — End: 2014-04-05
  Administered 2014-04-05: 13:00:00 via INTRAVENOUS

## 2014-04-05 MED ORDER — LISINOPRIL 20 MG TAB
20 mg | Freq: Every day | ORAL | Status: DC
Start: 2014-04-05 — End: 2014-04-06
  Administered 2014-04-05 – 2014-04-06 (×2): via ORAL

## 2014-04-05 MED ORDER — SODIUM CHLORIDE 0.9 % IV
100 unit/mL | INTRAVENOUS | Status: DC
Start: 2014-04-05 — End: 2014-04-05
  Administered 2014-04-05 (×5): via INTRAVENOUS

## 2014-04-05 MED ORDER — DEXTROSE 50% IN WATER (D50W) IV SYRG
INTRAVENOUS | Status: DC | PRN
Start: 2014-04-05 — End: 2014-04-07

## 2014-04-05 MED ORDER — SODIUM CHLORIDE 0.9 % IJ SYRG
Freq: Once | INTRAMUSCULAR | Status: AC
Start: 2014-04-05 — End: 2014-04-05
  Administered 2014-04-05: 13:00:00 via INTRAVENOUS

## 2014-04-05 MED ORDER — PHARMACY INFORMATION NOTE
Status: DC
Start: 2014-04-05 — End: 2014-04-05

## 2014-04-05 MED ORDER — INSULIN REGULAR HUMAN 100 UNIT/ML INJECTION
100 unit/mL | Freq: Once | INTRAMUSCULAR | Status: AC
Start: 2014-04-05 — End: 2014-04-05
  Administered 2014-04-05: 14:00:00 via INTRAVENOUS

## 2014-04-05 MED ORDER — ONDANSETRON (PF) 4 MG/2 ML INJECTION
4 mg/2 mL | Freq: Once | INTRAMUSCULAR | Status: AC
Start: 2014-04-05 — End: 2014-04-05
  Administered 2014-04-05: 15:00:00 via INTRAVENOUS

## 2014-04-05 MED ORDER — MORPHINE 4 MG/ML SYRINGE
4 mg/mL | INTRAMUSCULAR | Status: AC
Start: 2014-04-05 — End: 2014-04-05
  Administered 2014-04-05: 15:00:00 via INTRAVENOUS

## 2014-04-05 MED ORDER — ENOXAPARIN 40 MG/0.4 ML SUB-Q SYRINGE
40 mg/0.4 mL | SUBCUTANEOUS | Status: DC
Start: 2014-04-05 — End: 2014-04-07
  Administered 2014-04-05: 23:00:00 via SUBCUTANEOUS

## 2014-04-05 MED ORDER — 1/2 NS WITH POTASSIUM CHLORIDE 20 MEQ/L IV
20 mEq/L | INTRAVENOUS | Status: DC
Start: 2014-04-05 — End: 2014-04-05
  Administered 2014-04-05: 18:00:00 via INTRAVENOUS

## 2014-04-05 MED FILL — NS WITH POTASSIUM CHLORIDE 20 MEQ/L IV: 20 mEq/L | INTRAVENOUS | Qty: 1000

## 2014-04-05 MED FILL — CLEOCIN 600 MG/50 ML IN 5 % DEXTROSE INTRAVENOUS PIGGYBACK: 600 mg/50 mL | INTRAVENOUS | Qty: 50

## 2014-04-05 MED FILL — 1/2 NS WITH POTASSIUM CHLORIDE 20 MEQ/L IV: 20 mEq/L | INTRAVENOUS | Qty: 1000

## 2014-04-05 MED FILL — MORPHINE 4 MG/ML SYRINGE: 4 mg/mL | INTRAMUSCULAR | Qty: 1

## 2014-04-05 MED FILL — PHARMACY INFORMATION NOTE: Qty: 1

## 2014-04-05 MED FILL — ONDANSETRON (PF) 4 MG/2 ML INJECTION: 4 mg/2 mL | INTRAMUSCULAR | Qty: 2

## 2014-04-05 MED FILL — INSULIN REGULAR HUMAN 100 UNIT/ML INJECTION: 100 unit/mL | INTRAMUSCULAR | Qty: 1

## 2014-04-05 MED FILL — LISINOPRIL 20 MG TAB: 20 mg | ORAL | Qty: 1

## 2014-04-05 MED FILL — LOVENOX 40 MG/0.4 ML SUBCUTANEOUS SYRINGE: 40 mg/0.4 mL | SUBCUTANEOUS | Qty: 0.4

## 2014-04-05 MED FILL — METOCLOPRAMIDE 5 MG/ML IJ SOLN: 5 mg/mL | INTRAMUSCULAR | Qty: 2

## 2014-04-05 NOTE — Progress Notes (Signed)
Hp [id number]

## 2014-04-05 NOTE — ED Notes (Signed)
dka

## 2014-04-05 NOTE — ED Provider Notes (Signed)
[facility]  EMERGENCY DEPARTMENT TREATMENT REPORT  NAME:  [place], [place]  SEX:   F  ADMIT: 04/05/2014  DOB:   11-25-1985  MR#    [id number]  ROOM:  [id number]  TIME DICTATED: 09 10 AM  ACCT#  [id number]        I hereby certify this patient for admission based upon medical necessity as   noted below:      TIME SEEN:  0802    CHIEF COMPLAINT:  Elevated blood sugar, nausea and vomiting.    PRIMARY CARE PHYSICIAN:  Dr. Jerral Bonito.    HISTORY OF PRESENT ILLNESS:  This is a 29 year old female with history of type 1 diabetes and gastroparesis   who presents to the ED complaining of elevated blood sugar, abdominal pain   and vomiting she has had for the past 3 days.  She states that she was seen at   [facility] 2 days ago, was given medication and sent home.  This morning   she had woken up. The blood sugar was 403.  She has had multiple episodes of   vomiting and pain along her left side.  She states that she takes 28 units of   Lantus, Apidra on a sliding scale. Denies   any history of abdominal surgeries   other than her cesarean section.  The patient also admits that she has had a   dental infection located in right upper teeth.  She denies any fevers.    REVIEW OF SYSTEMS:  CONSTITUTIONAL:  No fever.  EYES:  No visual symptoms.  ENT:  No sore throat, runny nose, or other URI symptoms. Positive for a dental   infection.  ENDOCRINE:  Positive for diabetes.  RESPIRATORY:  No cough, shortness of breath.  CARDIOVASCULAR:  No chest pain.  GASTROINTESTINAL:  Positive for nausea, vomiting and abdominal pain.  No   diarrhea.  GENITOURINARY:  No dysuria or hematuria.  MUSCULOSKELETAL: No swelling.   INTEGUMENTARY:  No rashes.  NEUROLOGICAL:  No headaches.    PAST MEDICAL HISTORY:  Diabetes, gastroparesis, history of UTI, asthma.      SURGICAL HISTORY:   Cesarean section.    SOCIAL HISTORY:  The patient denies tobacco, alcohol or drug use.    FAMILY HISTORY:  Mother and father, otherwise healthy.    MEDICATION:    Include Reglan, Percocet, Zofran, Phenergan and Bentyl.    ALLERGIES:  PENICILLIN AND ZITHROMAX.     PHYSICAL EXAMINATION:  VITAL SIGNS:  BP 172/109, pulse 132, respirations 18, temperature 98.1, O2   sats 100% on room air.  GENERAL APPEARANCE:  This is a well-developed, well-nourished young [place]   [place] female in no acute distress but does appear uncomfortable while lying   on the stretcher with her family at bedside.  HEENT:  Eyes:  Conjunctivae clear, anicteric.  ENT:  The patient was some   tenderness to palpation in the right upper teeth.  The patient with poor   dental hygiene.  NECK:  Supple, nontender.  RESPIRATORY:  Clear to auscultation bilaterally.    CARDIOVASCULAR: The patient tachycardic. Rate and regular. No murmurs, rubs or   gallops.  ABDOMEN:  Soft, mildly tender both in the left upper and left lower quadrant.    No rebound, guarding noted.  No tenderness in the right upper or right lower   quadrant.  GENITOURINARY:  No cva tenderness bilaterally.   MUSCULOSKELETAL:  The patient able to move 4 extremities.  No peripheral edema  noted.  SKIN:  Warm and dry without rashes.  NEUROLOGIC:  The patient alert and oriented, answered questions appropriately.    INITIAL ASSESSMENT AND MANAGEMENT PLAN:  This is a 29 year old female with history of type 1 diabetes and gastroparesis   who presents to the ED with an elevated blood sugar, abdominal pain, vomiting   with concern for possible DKA. We will order iSTAT, basic labs, blood gas,   hepatic function panel, lipase and treat her symptomatically.    CONTINUATION BY Jannetta QuintOURTNEY Loney Domingo, MD:     INITIAL ASSESSMENT AND MANAGEMENT PLAN:   This a 29 year old female, type 1 diabetic, with a history of gastroparesis.    She presents to the Emergency Department with intractable vomiting.  She   [facility]  They improved her symptoms   temporarily while there but then had recurrence of her symptoms.  I asked her    if she has had any infectious symptoms or pain, fever.  She has noted that 3   days ago she started having some dental pain on the right upper area of her   mouth, in molars where she has multiple dental caries.  I suspect this may be   the nidus for her infection, rising blood sugars and then recurrence of her   gastroparesis.  For this reason, we dose her with IV clindamycin.  She is   currently unable to hold anything down.  Plan of care was obtaining basic labs   for her and determining if she is in DKA, although I suspect she likely is   based on the ketotic smell in the room.    DIAGNOSTIC INTERPRETATIONS:   White count 18.5, hemoglobin 13.0, hematocrit 38.4, platelet 264.  Urinalysis   shows some small bilirubin, trace leukocyte esterase, otherwise negative.    Micro is still pending.  Chemistries show that her bicarbonate is 16, her is   glucose is 269, creatinine 0.9, anion gap is calculated at 18, potassium 3.9.    I have added on venous gas, serum ketones, magnesium and phosphorous given   that she is in DKA.  I have initiated DKA protocol.  I discussed the case with   Dr. Faythe GheeLisa Huang who will be the admitting physician.  The patient will go to   the PCCU based on need for DKA protocol.  She has been ordered for IV   clindamycin for her dental infection.     EMERGENCY DEPARTMENT DIAGNOSES:  1.  Diabetic ketoacidosis.   2.  Gastroparesis.   3.  Acute dehydration.   4.  Dental infection.    PLAN:   As above.      ___________________  Lucio Edwardourtney T Djuna Frechette MD  Dictated By: Asencion IslamAnnalyn Abcede, PA-C    My signature above authenticates this document and my orders, the final  diagnosis (es), discharge prescription (s), and instructions in the [facility]   Pulsecheck record.  Nursing notes have been reviewed by the physician/mid-level provider.    If you have any questions please contact [phone].    JMB  D:04/05/2014 09:10:37  T: 04/05/2014 11:26:23  [id number]

## 2014-04-05 NOTE — Other (Signed)
Report to PCU

## 2014-04-05 NOTE — H&P (Signed)
[facility]  History and Physical  NAME:  [name] [name], [name] [name]  SEX:   F  ADMIT: 04/05/2014  DOB:30-Oct-1985  MR#    [id number]  ROOM:  ER29  ACCT#  [id number]    I hereby certify this patient for admission based upon medical necessity as   noted below:    <cc: Jerral BonitoWei Zhao MD    CHIEF COMPLAINT:  Nausea, vomiting, abdominal pain.    HISTORY OF PRESENT ILLNESS:  This is a 29 year old female with past medical history significant for type 1   diabetes mellitus as well as history of multiple DKA requiring admission and   gastroparesis with complaint of nausea, vomiting, abdominal pain.  The patient   stated she was in her normal state of health up to 3 nights ago.  Three   nights ago she started having a toothache, right upper tooth.  [facility]  She was given medication and sent home.  This morning she   woke up with blood sugar of 403.  Also, complained of nausea, vomiting and   abdominal pain.  She takes 28 units of Lantus daily at bedtime; however, due   to nausea, vomiting and abdominal pain, she did not take it yesterday.  The   last Lantus was taken 2 days ago.  She denies any chest pain, shortness   breath, diarrhea or constipation.  She did have fever of 101 at home.    PAST MEDICAL HISTORY:  1.  Type 1 diabetes mellitus.  2.  History of recurrent DKA with multiple admissions.  3.  Gastroparesis.  4.  Hypertension.    PAST SURGICAL HISTORY:  C-section.    ALLERGIES:  PENICILLIN AND AZITHROMYCIN.    FAMILY HISTORY:  Significant for diabetes mellitus.    SOCIAL HISTORY:  Denies alcohol, smoking or illicit drug use.    HOME MEDICATIONS:  1.  Lantus 28 units at bedtime.  2.  Sliding scale insulin.    REVIEW OF SYSTEMS:  A 12-point review of system was done.  Pertinent positives and negatives as in   HPI.    PHYSICAL EXAMINATION:  VITAL SIGNS:  Temperature 98.1, heart rate 104, blood pressure 173/109, oxygen   saturation 98% on room air.   GENERAL:  The patient is in moderate distress.  A&O times 3.  HEAD:  Normocephalic, atraumatic.  NOSE AND SINUSES:  No tenderness, no discharge.  MOUTH AND THROAT:  No erythema, no edema, no tonsillar enlargement.  NECK:  Trachea midline.  Thyroid within normal limits.  No JVD, no carotid   bruits.  LUNGS:  Clear to auscultation bilaterally without wheezing, rales or rhonchi.  CARDIOVASCULAR:  Regular rate and rhythm, no murmurs, gallops or clicks.  ABDOMEN:  Positive bowel sounds, soft.  Positive tenderness pan abdominal   area.  No rebound tenderness, no guarding.  EXTREMITIES:  No edema, no calf tenderness.  VASCULAR:  Dorsalis pedis, posterior popliteal symmetric bilaterally.  NEURO:  Cranial nerves II-XII grossly intact.  Deep tendon reflexes 2/4   symmetric bilaterally.  PSYCHIATRIC:  No mood swing.  No anxiety or depression.  SKIN:  No erythema, no edema.  MUSCULOSKELETAL:  No muscle tenderness.  Joint range of motion within normal   limits.    LABORATORY DATA:  WBC 18.5, hemoglobin 13, hematocrit 38.4, platelet 264.  BMP:  Sodium 138,   potassium 3.9, chloride 104, bicarbonate 16, glucose 269, BUN 26, creatinine   0.9, ALT 16, AST 13, lipase 80.  ASSESSMENT AND PLAN:  1.  Diabetic ketoacidosis likely secondary to noncompliance as well as dental   infection.  The patient had an anion gap of 18.  We will start patient on   insulin drip as well as IV fluid.  We will also treat dental infection.  We   will culture blood as well.  We will check hemoglobin A1c level.  2.  Dental infection.  We will start patient on clindamycin IV.  3.  Hypertension.  Currently the patient does not have any home medications   listed.  From past medical record the patient takes lisinopril.  We will start   the patient on lisinopril for now.  4.  Type 1 diabetes mellitus, uncontrolled with frequent DKA.  We will check   A1c level.  We will continue insulin drip.  Start patient on long-acting    Lantus once patient's DKA has resolved.  5.  Gastroparesis.  We will start patient on Reglan.  6.  Deep venous thrombosis prophylaxis with Lovenox.  7.  The patient is FULL CODE.      ___________________  Faythe Ghee DO  Dictated By: .   JMB  D:04/05/2014 13:39:50  T: 04/05/2014 13:57:38  [id number]

## 2014-04-06 LAB — MAGNESIUM
Magnesium: 1.8 mg/dl (ref 1.8–2.4)
Magnesium: 1.6 mg/dl — ABNORMAL LOW (ref 1.8–2.4)

## 2014-04-06 LAB — GLUCOSE, POC
Glucose (POC): 227 mg/dL — ABNORMAL HIGH (ref 65–105)
Glucose (POC): 179 mg/dL — ABNORMAL HIGH (ref 65–105)
Glucose (POC): 181 mg/dL — ABNORMAL HIGH (ref 65–105)
Glucose (POC): 133 mg/dL — ABNORMAL HIGH (ref 65–105)
Glucose (POC): 140 mg/dL — ABNORMAL HIGH (ref 65–105)
Glucose (POC): 101 mg/dL (ref 65–105)
Glucose (POC): 135 mg/dL — ABNORMAL HIGH (ref 65–105)
Glucose (POC): 105 mg/dL (ref 65–105)
Glucose (POC): 112 mg/dL — ABNORMAL HIGH (ref 65–105)
Glucose (POC): 87 mg/dL (ref 65–105)
Glucose (POC): 119 mg/dL — ABNORMAL HIGH (ref 65–105)
Glucose (POC): 121 mg/dL — ABNORMAL HIGH (ref 65–105)
Glucose (POC): 141 mg/dL — ABNORMAL HIGH (ref 65–105)
Glucose (POC): 151 mg/dL — ABNORMAL HIGH (ref 65–105)
Glucose (POC): 213 mg/dL — ABNORMAL HIGH (ref 65–105)
Glucose (POC): 195 mg/dL — ABNORMAL HIGH (ref 65–105)
Glucose (POC): 212 mg/dL — ABNORMAL HIGH (ref 65–105)
Glucose (POC): 215 mg/dL — ABNORMAL HIGH (ref 65–105)
Glucose (POC): 188 mg/dL — ABNORMAL HIGH (ref 65–105)
Glucose (POC): 134 mg/dL — ABNORMAL HIGH (ref 65–105)

## 2014-04-06 LAB — METABOLIC PANEL, BASIC
BUN: 15 mg/dl (ref 7–25)
BUN: 13 mg/dl (ref 7–25)
BUN: 9 mg/dl (ref 7–25)
BUN: 8 mg/dl (ref 7–25)
BUN: 5 mg/dl — ABNORMAL LOW (ref 7–25)
CO2: 20 mEq/L — ABNORMAL LOW (ref 21–32)
CO2: 23 mEq/L (ref 21–32)
CO2: 19 mEq/L — ABNORMAL LOW (ref 21–32)
CO2: 19 mEq/L — ABNORMAL LOW (ref 21–32)
CO2: 23 mEq/L (ref 21–32)
Calcium: 8.3 mg/dl — ABNORMAL LOW (ref 8.5–10.1)
Calcium: 8 mg/dl — ABNORMAL LOW (ref 8.5–10.1)
Calcium: 8.3 mg/dl — ABNORMAL LOW (ref 8.5–10.1)
Calcium: 8.2 mg/dl — ABNORMAL LOW (ref 8.5–10.1)
Calcium: 8.4 mg/dl — ABNORMAL LOW (ref 8.5–10.1)
Chloride: 107 mEq/L (ref 98–107)
Chloride: 106 mEq/L (ref 98–107)
Chloride: 105 mEq/L (ref 98–107)
Chloride: 104 mEq/L (ref 98–107)
Chloride: 103 mEq/L (ref 98–107)
Creatinine: 0.9 mg/dl (ref 0.6–1.3)
Creatinine: 0.8 mg/dl (ref 0.6–1.3)
Creatinine: 0.7 mg/dl (ref 0.6–1.3)
Creatinine: 0.8 mg/dl (ref 0.6–1.3)
Creatinine: 0.8 mg/dl (ref 0.6–1.3)
GFR est AA: >60
GFR est AA: >60
GFR est AA: >60
GFR est AA: >60
GFR est AA: >60
GFR est non-AA: >60
GFR est non-AA: >60
GFR est non-AA: >60
GFR est non-AA: >60
GFR est non-AA: >60
Glucose: 235 mg/dl — ABNORMAL HIGH (ref 74–106)
Glucose: 136 mg/dl — ABNORMAL HIGH (ref 74–106)
Glucose: 115 mg/dl — ABNORMAL HIGH (ref 74–106)
Glucose: 174 mg/dl — ABNORMAL HIGH (ref 74–106)
Glucose: 182 mg/dl — ABNORMAL HIGH (ref 74–106)
Potassium: 3.8 mEq/L (ref 3.5–5.1)
Potassium: 3.8 mEq/L (ref 3.5–5.1)
Potassium: 3.4 mEq/L — ABNORMAL LOW (ref 3.5–5.1)
Potassium: 3.6 mEq/L (ref 3.5–5.1)
Potassium: 3.4 mEq/L — ABNORMAL LOW (ref 3.5–5.1)
Sodium: 136 mEq/L (ref 136–145)
Sodium: 135 mEq/L — ABNORMAL LOW (ref 136–145)
Sodium: 137 mEq/L (ref 136–145)
Sodium: 135 mEq/L — ABNORMAL LOW (ref 136–145)
Sodium: 134 mEq/L — ABNORMAL LOW (ref 136–145)

## 2014-04-06 LAB — CBC WITH AUTOMATED DIFF
BASOPHILS: 0.4 % (ref 0–3)
EOSINOPHILS: 0.1 % (ref 0–5)
HCT: 33.4 % — ABNORMAL LOW (ref 37.0–50.0)
HGB: 11.2 gm/dl — ABNORMAL LOW (ref 13.0–17.2)
IMMATURE GRANULOCYTES: 0.3 % (ref 0.0–3.0)
LYMPHOCYTES: 26.6 % — ABNORMAL LOW (ref 28–48)
MCH: 29.6 pg (ref 25.4–34.6)
MCHC: 33.5 gm/dl (ref 30.0–36.0)
MCV: 88.4 fL (ref 80.0–98.0)
MONOCYTES: 5.7 % (ref 1–13)
MPV: 11.2 fL — ABNORMAL HIGH (ref 6.0–10.0)
NEUTROPHILS: 66.9 % — ABNORMAL HIGH (ref 34–64)
NRBC: 0 (ref 0–0)
PLATELET: 235 10*3/uL (ref 140–450)
RBC: 3.78 M/uL (ref 3.60–5.20)
RDW-SD: 41.7 (ref 36.4–46.3)
WBC: 13.8 10*3/uL — ABNORMAL HIGH (ref 4.0–11.0)

## 2014-04-06 LAB — PHOSPHORUS: Phosphorus: 2.3 mg/dl — ABNORMAL LOW (ref 2.5–4.9)

## 2014-04-06 LAB — HEMOGLOBIN A1C WITH EAG: Hemoglobin A1c: 8.7 % — ABNORMAL HIGH (ref 4.8–6.0)

## 2014-04-06 MED ORDER — INSULIN LISPRO 100 UNIT/ML INJECTION
100 unit/mL | Freq: Four times a day (QID) | SUBCUTANEOUS | Status: DC
Start: 2014-04-06 — End: 2014-04-07
  Administered 2014-04-06 – 2014-04-07 (×2): via SUBCUTANEOUS

## 2014-04-06 MED ORDER — INSULIN GLARGINE 100 UNIT/ML INJECTION
100 unit/mL | Freq: Every day | SUBCUTANEOUS | Status: DC
Start: 2014-04-06 — End: 2014-04-07
  Administered 2014-04-06 – 2014-04-07 (×2): via SUBCUTANEOUS

## 2014-04-06 MED ORDER — SODIUM CHLORIDE 0.9 % IJ SYRG
INTRAMUSCULAR | Status: AC
Start: 2014-04-06 — End: 2014-04-06
  Administered 2014-04-06: 23:00:00

## 2014-04-06 MED ORDER — GLUCAGON 1 MG INJECTION
1 mg | INTRAMUSCULAR | Status: DC | PRN
Start: 2014-04-06 — End: 2014-04-07

## 2014-04-06 MED ORDER — DIPHENHYDRAMINE HCL 50 MG/ML IJ SOLN
50 mg/mL | Freq: Four times a day (QID) | INTRAMUSCULAR | Status: DC | PRN
Start: 2014-04-06 — End: 2014-04-07
  Administered 2014-04-06 – 2014-04-07 (×4): via INTRAVENOUS

## 2014-04-06 MED ORDER — LISINOPRIL 20 MG TAB
20 mg | Freq: Every day | ORAL | Status: DC
Start: 2014-04-06 — End: 2014-04-07
  Administered 2014-04-07: 13:00:00 via ORAL

## 2014-04-06 MED ORDER — SODIUM CHLORIDE 0.9 % IJ SYRG
INTRAMUSCULAR | Status: AC
Start: 2014-04-06 — End: 2014-04-06
  Administered 2014-04-06: 21:00:00

## 2014-04-06 MED ORDER — POTASSIUM CHLORIDE SR 20 MEQ TAB, PARTICLES/CRYSTALS
20 mEq | ORAL | Status: AC
Start: 2014-04-06 — End: 2014-04-06
  Administered 2014-04-06: 16:00:00 via ORAL

## 2014-04-06 MED ORDER — SODIUM CHLORIDE 0.9 % IJ SYRG
INTRAMUSCULAR | Status: AC
Start: 2014-04-06 — End: 2014-04-06

## 2014-04-06 MED ORDER — SODIUM CHLORIDE 0.9 % IJ SYRG
INTRAMUSCULAR | Status: AC
Start: 2014-04-06 — End: 2014-04-06
  Administered 2014-04-06: 05:00:00

## 2014-04-06 MED ORDER — DEXTROSE 50% IN WATER (D50W) IV SYRG
INTRAVENOUS | Status: DC | PRN
Start: 2014-04-06 — End: 2014-04-07

## 2014-04-06 MED ORDER — PROMETHAZINE IN NS 25 MG/50 ML IV PIGGY BAG
25 mg/50 ml | Freq: Four times a day (QID) | INTRAVENOUS | Status: DC | PRN
Start: 2014-04-06 — End: 2014-04-07
  Administered 2014-04-07 (×2): via INTRAVENOUS

## 2014-04-06 MED FILL — INSULIN LISPRO 100 UNIT/ML INJECTION: 100 unit/mL | SUBCUTANEOUS | Qty: 1

## 2014-04-06 MED FILL — METOCLOPRAMIDE 5 MG/ML IJ SOLN: 5 mg/mL | INTRAMUSCULAR | Qty: 2

## 2014-04-06 MED FILL — NS WITH POTASSIUM CHLORIDE 20 MEQ/L IV: 20 mEq/L | INTRAVENOUS | Qty: 1000

## 2014-04-06 MED FILL — DIPHENHYDRAMINE HCL 50 MG/ML IJ SOLN: 50 mg/mL | INTRAMUSCULAR | Qty: 1

## 2014-04-06 MED FILL — PROMETHAZINE IN NS 25 MG/50 ML IV PIGGY BAG: 25 mg/50 ml | INTRAVENOUS | Qty: 50

## 2014-04-06 MED FILL — ONDANSETRON (PF) 4 MG/2 ML INJECTION: 4 mg/2 mL | INTRAMUSCULAR | Qty: 2

## 2014-04-06 MED FILL — MORPHINE 2 MG/ML INJECTION: 2 mg/mL | INTRAMUSCULAR | Qty: 1

## 2014-04-06 MED FILL — BD POSIFLUSH NORMAL SALINE 0.9 % INJECTION SYRINGE: INTRAMUSCULAR | Qty: 20

## 2014-04-06 MED FILL — CLEOCIN 600 MG/50 ML IN 5 % DEXTROSE INTRAVENOUS PIGGYBACK: 600 mg/50 mL | INTRAVENOUS | Qty: 50

## 2014-04-06 MED FILL — LISINOPRIL 20 MG TAB: 20 mg | ORAL | Qty: 1

## 2014-04-06 MED FILL — LOVENOX 40 MG/0.4 ML SUBCUTANEOUS SYRINGE: 40 mg/0.4 mL | SUBCUTANEOUS | Qty: 0.4

## 2014-04-06 MED FILL — BD POSIFLUSH NORMAL SALINE 0.9 % INJECTION SYRINGE: INTRAMUSCULAR | Qty: 40

## 2014-04-06 MED FILL — KLOR-CON M20 MEQ TABLET,EXTENDED RELEASE: 20 mEq | ORAL | Qty: 2

## 2014-04-06 MED FILL — INSULIN GLARGINE 100 UNIT/ML INJECTION: 100 unit/mL | SUBCUTANEOUS | Qty: 1

## 2014-04-06 MED FILL — BD POSIFLUSH NORMAL SALINE 0.9 % INJECTION SYRINGE: INTRAMUSCULAR | Qty: 30

## 2014-04-06 NOTE — Progress Notes (Signed)
Problem: Diabetes Self-Management  Goal: *Prevention, detection, treatment of acute complications  List symptoms of hyper- and hypoglycemia; describe how to treat low blood sugar and actions for lowering high blood glucose level.   Outcome: Not Progressing Towards Goal  Patient unwilling to develop plan at this time to prevent complications

## 2014-04-06 NOTE — Progress Notes (Signed)
NUTRITION glycemic control evaluation    Education history: patient has been in the hospital on numerous occasions and has received DM education    Current diet order: DIET GESTATIONAL DIABETIC 1800KCAL    Diet history: Type 1 DM with a tooth infection    Compliance history:  [ ] Compliant   [x ] Inconsistent/limited compliance   [ ] Noncompliant    Biochemical data:  ?? Blood glucose at hospital: 181-337   HgbA1c:   Lab Results   Component Value Date/Time    HEMOGLOBIN A1C 8.7 04/06/2014 03:45 AM    HEMOGLOBIN A1C 8.6 04/05/2014 08:10 AM    HEMOGLOBIN A1C 7.9 03/11/2014 10:00 AM   ??     Current DM medications: insulin    Home DM medications: insulin    Education needs: DM diet review  ______________________________________________________________________    Diet instructed on: Healthy Meal Planning for DM    Education modifications: none    Barriers to learning/intervention: none    Method of Instruction: written, verbal    Patient comprehension: fair to good    Expected compliance: fair    April HoldingHARLENE C CURTIS, RD  04/06/2014

## 2014-04-06 NOTE — Other (Deleted)
Bedside and Verbal shift change report given to Alphonzo LemmingsErlinda RN (oncoming nurse) by Janace ArisArielle RN (offgoing nurse). Report included the following information SBAR, Kardex, Intake/Output, MAR, Recent Results, Med Rec Status and Cardiac Rhythm ST when OOB..Marland Kitchen

## 2014-04-06 NOTE — Other (Signed)
Dr. Renaldo ReelHuang rounding and notified of patient VS, blood sugars, tachycardia 124 when OOB, orders received.

## 2014-04-06 NOTE — Other (Signed)
Bedside and Verbal shift change report given to MalaysiaErlinda rn (Cabin crewoncoming nurse) by Ancil BoozerArielle rn (offgoing nurse). Report included the following information SBAR, Kardex, Intake/Output, MAR, Recent Results, Med Rec Status and Cardiac Rhythm ST 124 with OOB..Marland Kitchen

## 2014-04-06 NOTE — Progress Notes (Signed)
Assessment/Plan:     Patient Active Problem List    Diagnosis Date Noted   ??? DKA (diabetic ketoacidoses) (HCC) 04/05/2014     1.?? Diabetic ketoacidosis likely secondary to noncompliance as well as dental ??  infection.?? The patient anion gap closed.?? We will start patient on ??  Sc insulin and d/c insulin drip as well as IV fluid.?? We will also cont. treat dental infection.?? We will culture blood as well.?? We will check hemoglobin A1c level.  2.?? Dental infection.?? We will cont. patient on clindamycin IV.  3.?? Hypertension.?? not controlled. Increase lisinopril.   4.?? Type 1 diabetes mellitus, uncontrolled with frequent DKA.?? resolved. We will check ??  A1c level.?? We will d/c insulin drip.?? Start patient on long-acting ??  Lantus.   5.?? Gastroparesis.?? We will cont. patient on Reglan.  6.?? Deep venous thrombosis prophylaxis with Lovenox.  7.?? The patient is FULL CODE.  ??  Continue home regimen for otherwise chronic, stable medical conditions as noted above.  Discussed with patient. Updates regarding management, prognosis, treatment and complications of the patient's medical conditions discussed in detail. All questions answered to the satisfaction of those individual(s) who also verbalized understanding of and agreement with the assessment and plan.   Date of anticipated Discharge: in am.     EXAM:  GENERAL: in mild distress.   HEENT: Normocephalic and atraumatic head. Oral mucosa moist. No thrush.    RESPIRATORY: No use of accessory muscles of respiration. Bilateral BS present, decreased at bases. No rales or rhonchi.   CARDIOVASCULAR: S1 and S2 present, regular. No murmur, rub, or thrill.   ABDOMEN: Soft and nontender with positive bowel sounds. No organomegaly.   EXTREMITIES: No edema. No calf tenderness.   NEUROLOGICAL: Cranial nerves II through XII grossly intact. No focal weakness.   SKIN: No rash. Skin is warm, dry, and intact.     Subjective:     Patient c/o abd pain.       Filed Vitals:     04/06/14 0503 04/06/14 0602 04/06/14 0803 04/06/14 1002   BP: 108/68 128/80 166/87 158/100   Pulse: 100 96 100 99   Temp:   99 ??F (37.2 ??C)    Resp: Height:       Weight:       SpO2: 99% 100% 100% 99%       Recent Labs      04/06/14   0350   WBC  13.8*   HGB  11.2*   HCT  33.4*   MCV  88.4   PLT  235     Recent Labs      04/06/14   0707   NA  135*   K  3.6   CL  104   CO2  19*   CA  8.2*   BUN  8   CREA  0.8   GLU  174*     Recent Labs      04/05/14   0856   ALB  4.0   TP  8.4*   SGOT  13*   AP  109   TBILI  1.1*   CBIL  0.3*   ALT  16     Recent Labs      04/05/14   0817   GLUCU  >=1000*   BILU  Small*   KETU  >=160*   SPGRU  >=1.030   BLDU  Negative   PHU  6.0   PROTU  100*   UROU  1.0   NITU  Negative   LEUKU  Trace*      Recent Labs      04/06/14   0956  04/06/14   0919  04/06/14   0803  04/06/14   0707  04/06/14   0612  04/06/14   0509  04/06/14   0359  04/06/14   0258   GLUCPOC  212*  195*  213*  151*  141*  121*  119*  87         Total of 38 minutes of which more than 50% was spent in coordination of care and counseling (time spent with patient/family face to face, physical exam, reviewing laboratory and imaging investigations, speaking with physicians and nursing staff involved in this patient's care).     Faythe GheeLisa Higinio Grow D.O.  [facility]

## 2014-04-06 NOTE — Other (Signed)
Bedside and Verbal shift change report given to [facility] Hospital[facility]ENNA MCREYNOLDS, RN   (oncoming nurse) by Aggie Cosierrystal RN (offgoing nurse). Report included the following information SBAR, Kardex, Intake/Output, MAR and Recent Results.

## 2014-04-06 NOTE — Other (Signed)
TRANSFER - OUT REPORT:    Verbal report given to CRYSTAL RN on Ameren CorporationDevale S Stanaland  being transferred to 5W(unit) for routine progression of care       Report consisted of patient???s Situation, Background, Assessment and   Recommendations(SBAR).     Information from the following report(s) SBAR, Kardex, Intake/Output, MAR, Recent Results, Med Rec Status and Cardiac Rhythm SR - ST 103 was reviewed with the receiving nurse.    Lines:   Peripheral IV 04/05/14 Right Forearm (Active)   Site Assessment Clean, dry, & intact 04/05/2014  8:26 PM   Phlebitis Assessment 0 04/05/2014  8:26 PM   Infiltration Assessment 0 04/05/2014  8:26 PM   Dressing Status Clean, dry, & intact 04/05/2014  8:26 PM   Dressing Type Transparent 04/05/2014  8:26 PM   Hub Color/Line Status Flushed;Patent 04/05/2014  8:10 AM       Peripheral IV 04/05/14 Left Forearm (Active)   Site Assessment Clean, dry, & intact 04/05/2014  8:26 PM   Phlebitis Assessment 0 04/05/2014  8:26 PM   Infiltration Assessment 0 04/05/2014  8:26 PM   Dressing Status Clean, dry, & intact 04/05/2014  8:26 PM   Dressing Type Transparent 04/05/2014  8:26 PM        Opportunity for questions and clarification was provided.      Patient transported with:   [employer]

## 2014-04-07 LAB — CBC WITH AUTOMATED DIFF
BASOPHILS: 0.4 % (ref 0–3)
EOSINOPHILS: 0.7 % (ref 0–5)
HCT: 37.3 % (ref 37.0–50.0)
HGB: 12.3 gm/dl — ABNORMAL LOW (ref 13.0–17.2)
IMMATURE GRANULOCYTES: 0.4 % (ref 0.0–3.0)
LYMPHOCYTES: 28.7 % (ref 28–48)
MCH: 28.7 pg (ref 25.4–34.6)
MCHC: 33 gm/dl (ref 30.0–36.0)
MCV: 86.9 fL (ref 80.0–98.0)
MONOCYTES: 6.1 % (ref 1–13)
MPV: 11.2 fL — ABNORMAL HIGH (ref 6.0–10.0)
NEUTROPHILS: 63.7 % (ref 34–64)
NRBC: 0 (ref 0–0)
PLATELET: 236 10*3/uL (ref 140–450)
RBC: 4.29 M/uL (ref 3.60–5.20)
RDW-SD: 40.6 (ref 36.4–46.3)
WBC: 7.4 10*3/uL (ref 4.0–11.0)

## 2014-04-07 LAB — METABOLIC PANEL, BASIC
BUN: 5 mg/dl — ABNORMAL LOW (ref 7–25)
BUN: 6 mg/dl — ABNORMAL LOW (ref 7–25)
CO2: 23 mEq/L (ref 21–32)
CO2: 22 mEq/L (ref 21–32)
Calcium: 8.4 mg/dl — ABNORMAL LOW (ref 8.5–10.1)
Calcium: 8.4 mg/dl — ABNORMAL LOW (ref 8.5–10.1)
Chloride: 101 mEq/L (ref 98–107)
Chloride: 101 mEq/L (ref 98–107)
Creatinine: 1 mg/dl (ref 0.6–1.3)
Creatinine: 0.9 mg/dl (ref 0.6–1.3)
GFR est AA: >60
GFR est AA: >60
GFR est non-AA: >60
GFR est non-AA: >60
Glucose: 172 mg/dl — ABNORMAL HIGH (ref 74–106)
Glucose: 251 mg/dl — ABNORMAL HIGH (ref 74–106)
Potassium: 3.2 mEq/L — ABNORMAL LOW (ref 3.5–5.1)
Potassium: 3.2 mEq/L — ABNORMAL LOW (ref 3.5–5.1)
Sodium: 133 mEq/L — ABNORMAL LOW (ref 136–145)
Sodium: 135 mEq/L — ABNORMAL LOW (ref 136–145)

## 2014-04-07 LAB — GLUCOSE, POC
Glucose (POC): 136 mg/dL — ABNORMAL HIGH (ref 65–105)
Glucose (POC): 265 mg/dL — ABNORMAL HIGH (ref 65–105)

## 2014-04-07 MED ORDER — SODIUM CHLORIDE 0.9 % IJ SYRG
INTRAMUSCULAR | Status: DC | PRN
Start: 2014-04-07 — End: 2014-04-07
  Administered 2014-04-07 (×5): via INTRAVENOUS

## 2014-04-07 MED ORDER — CLINDAMYCIN 300 MG CAP
300 mg | ORAL_CAPSULE | Freq: Three times a day (TID) | ORAL | Status: AC
Start: 2014-04-07 — End: 2014-04-12

## 2014-04-07 MED ORDER — MORPHINE 2 MG/ML INJECTION
2 mg/mL | INTRAMUSCULAR | Status: DC | PRN
Start: 2014-04-07 — End: 2014-04-07
  Administered 2014-04-07 (×2): via INTRAVENOUS

## 2014-04-07 MED FILL — DIPHENHYDRAMINE HCL 50 MG/ML IJ SOLN: 50 mg/mL | INTRAMUSCULAR | Qty: 1

## 2014-04-07 MED FILL — BD POSIFLUSH NORMAL SALINE 0.9 % INJECTION SYRINGE: INTRAMUSCULAR | Qty: 10

## 2014-04-07 MED FILL — MORPHINE 2 MG/ML INJECTION: 2 mg/mL | INTRAMUSCULAR | Qty: 1

## 2014-04-07 MED FILL — METOCLOPRAMIDE 5 MG/ML IJ SOLN: 5 mg/mL | INTRAMUSCULAR | Qty: 2

## 2014-04-07 MED FILL — LISINOPRIL 20 MG TAB: 20 mg | ORAL | Qty: 2

## 2014-04-07 MED FILL — CLEOCIN 600 MG/50 ML IN 5 % DEXTROSE INTRAVENOUS PIGGYBACK: 600 mg/50 mL | INTRAVENOUS | Qty: 50

## 2014-04-07 NOTE — Other (Signed)
Bedside and Verbal shift change report given to Chari ManningBecca, Charity fundraiserN (Cabin crewoncoming nurse) by Eileen StanfordJenna, RN (offgoing nurse). Report included the following information SBAR, Kardex, MAR and Recent Results.

## 2014-04-07 NOTE — Discharge Summary (Signed)
Discharge Summary   Admit Date: 04/05/2014  Discharge Date:  April 07, 2014        Patient ID:  [name] [name]  29 y.o.  Jul 22, 1985    Chief Complaint   Patient presents with   ??? High Blood Sugar     Patient states her blood glucose was 403 this morning.   ??? Abdominal Pain   ??? Vomiting       Discharge Diagnoses/Hospital Course:  Patient Active Problem List    Diagnosis Date Noted   ??? Diabetic ketoacidosis (HCC) 05/03/2014   ??? DKA (diabetic ketoacidoses) (HCC) 04/05/2014       Problem List Items Addressed This Visit     None      Visit Diagnoses     Type 1 diabetes mellitus with ketoacidosis and without coma (HCC)    -  Primary     Relevant Medications     insulin regular (NOVOLIN R, HUMULIN R) injection 5 Units (Completed)     insulin regular (NOVOLIN R, HUMULIN R) injection 5 Units (Completed)     lisinopril (PRINIVIL, ZESTRIL) 20 mg tablet     insulin glulisine (APIDRA SOLOSTAR) 100 unit/mL pen     insulin glargine (LANTUS SOLOSTAR) 100 unit/mL (3 mL) pen     potassium chloride (K-DUR, KLOR-CON) SR tablet 40 mEq (Completed)     Dental infection         Gastroparesis         Relevant Medications     ondansetron (ZOFRAN) injection 4 mg (Completed)     ondansetron (ZOFRAN) injection 4 mg (Completed)     metoclopramide HCl (REGLAN) 5 mg/5 mL syrup           1.?? Diabetic ketoacidosis likely secondary to noncompliance as well as dental ??  infection.?? The patient anion gap closed.?? s/p insulin drip as well as IV fluid.??  2.?? Dental infection.?? Received clindamycin IV.  3.?? Hypertension.?? not controlled. Increased lisinopril. ??  4.?? Type 1 diabetes mellitus, uncontrolled with frequent DKA.?? resolved. S/p insulin drip.?? Started patient on long-acting??Lantus. ??  5.?? Gastroparesis.?? We will cont. patient on Reglan.  6.?? Deep venous thrombosis prophylaxis with Lovenox.  7.?? The patient is FULL CODE.    Discharge meds:     citalopram (CELEXA) 20 mg tablet Take 20 mg by mouth daily.,   insulin glargine (LANTUS SOLOSTAR) 100 unit/mL (3 mL) pen 22 Units by SubCUTAneous route nightly.,   insulin glulisine (APIDRA SOLOSTAR) 100 unit/mL pen 20 Units by SubCUTAneous route Before breakfast, lunch, and dinner.,   lisinopril (PRINIVIL, ZESTRIL) 20 mg tablet Take 20 mg by mouth daily.,   metoclopramide HCl (REGLAN) 5 mg/5 mL syrup Take 1 tsp by mouth Before breakfast, lunch, and dinner.,       Imaging:  Xr Chest Sngl V    05/03/2014   ADDITIONAL HISTORY: DKA. Diabetic ketoacidosis COMPARISON: 01/31/2014 TECHNIQUE: Single view chest.     05/03/2014   IMPRESSION: Heart size is normal. Lungs are clear and well aerated. No focal consolidation.       Physical Exam on Discharge:  BP 139/93 mmHg   Pulse 107   Temp(Src) 97 ??F (36.1 ??C)   Resp 21   Ht  (1.676 m)   Wt 54.885 kg (121 lb)   BMI 19.54 kg/m2   SpO2 100%   Breastfeeding? No  GENERAL: in mild distress.   HEENT: Normocephalic and atraumatic head. Oral mucosa moist. No thrush.   Eyes: Pupils are equal, reactive  to light and accommodation. Normal extra ocular movements. No icterus.   NECK: Supple. Trachea midline.   RESPIRATORY: No use of accessory muscles of respiration. Bilateral BS present, decreased at bases. No rales or rhonchi.   CARDIOVASCULAR: S1 and S2 present, regular. No murmur, rub, or thrill.   ABDOMEN: Soft and nontender with positive bowel sounds. No organomegaly.   EXTREMITIES: No edema. No calf tenderness.   NEUROLOGICAL: Cranial nerves II through XII grossly intact. No focal weakness.   SKIN: No rash. Skin is warm, dry, and intact.       Activity: As tolerated    Diet: Diabetic Diet    Labs:  Labs: Results:       Chemistry Recent Labs      05/03/14   0458  05/03/14   0215  05/03/14   0051  05/02/14   2320   GLU  183*   --    --   428*   NA  141   --    --   136   K  4.3   --    --   5.0*   CL  110*   --    --   102   CO2  17*  21   --   17*   BUN  19   --    --   27*    CREA  0.8   --    --   1.0   AP   --    --   127*   --    TP   --    --   8.5*   --    ALB   --    --   3.7   --       CBC w/Diff Recent Labs      05/03/14   0458  05/03/14   0051  05/03/14   0030   WBC   --   11.7*  13.9*   RBC   --   4.36  4.85   HGB  10.5*  12.3*  13.7   HCT  31*  38.3  43.3   PLT   --   250  271   GRANS   --   88.4*  89.8*   LYMPH   --   9.1*  7.6*   EOS   --   0.0  0.0      Cardiac Enzymes No results for input(s): CPK, CKND1, MYO in the last 72 hours.    Invalid input(s): CKRMB, TROIP   Coagulation No results for input(s): PTP, INR, APTT in the last 72 hours.    Invalid input(s): INREXT    Lipid Panel Lab Results   Component Value Date/Time    CHOLESTEROL, TOTAL 160 12/14/2013 02:52 AM    HDL CHOLESTEROL 44 12/14/2013 02:52 AM    LDL, CALCULATED 105 12/14/2013 02:52 AM    TRIGLYCERIDE 56 12/14/2013 02:52 AM    CHOL/HDL RATIO 3.6 12/14/2013 02:52 AM      BNP No results for input(s): BNPP in the last 72 hours.   Liver Enzymes Recent Labs      05/03/14   0051   TP  8.5*   ALB  3.7   AP  127*   SGOT  20      Thyroid Studies Lab Results   Component Value Date/Time    TSH 0.379 05/03/2014 12:35 AM        Imaging:  Xr Chest Sngl V    05/03/2014   ADDITIONAL HISTORY: DKA. Diabetic ketoacidosis COMPARISON: 01/31/2014 TECHNIQUE: Single view chest.     05/03/2014   IMPRESSION: Heart size is normal. Lungs are clear and well aerated. No focal consolidation.       Treatment Team: Treatment Team: Care Manager: Yetta Numbers, RN; Care Manager: Claudia Desanctis, RN    Condition at discharge: Stable.     Disposition:  Home       PCP:  Juliette Alcide, MD    Dr. Juliette Alcide, MD, thank you for allowing [place] to participate in the care of this patient.  Please contact me through the hospital if questions arise.    Total time for DC was 45 minutes.      Faythe Ghee, D.O.  [facility]

## 2014-04-07 NOTE — Progress Notes (Addendum)
Case Management Assessment  Patient lives at home with her mother, has 14 steps total in the house and no pets or DME.  She drives and was able to do 100% of her ADLS prior to admission and anticipate she will be able to do 100% of ADLS upon return to home after DC.  Pharmacy and PCP were verified and correct at the time of the interview.  No needs at this time.  @ [phone] Patient being DC'd home today without any needs. Family driving patient home no needs identified at this time.  Alll DC instructions and medication Scripts will be given to the patient by the DC RN at the time of DC  Preferred Language for Healthcare Related Communication     Preferred Language for Healthcare Related Communication: English   Spiritual/Ethnic/Cultural/Religious Needs that Should be Incorporated Into Your Care          FUNCTIONAL ASSESSMENT   Fall in Past 12 Months: No               Decline in Gait/Transfer/Balance: No       Decline in Capacity to Feed/Dress/Bathe: No   Developmental Delay: No   Chewing/Swallowing Problems: No      DYSPHAGIA SCREENING                          Difficulty with Secretions     Difficulty with Secretions: No      Speech Slurred/Thick/Garbled     Speech Slurred/Thick/Garbled: No      ABUSE/NEGLECT SCREENING   Physical Abuse/Neglect: Denies   Sexual Abuse: Denies   Sexual Abuse: Denies   Other Abuse/Issues: Denies          PRIMARY DECISION MAKER   Primary Decision Maker Name: [name] [name]     Primary Decision Maker Phone Number: [phone]                              ADVANCE CARE PLANNING (ACP) DOCUMENTS   Confirm Advance Directive: None              Suicide/Psychosocial Screening   Primary Diagnosis or Primary Complaint of an Emotional Behavior Disorder: No   Patient is Currently Experiencing Depression: No   Suicidal Ideation/Attempts: No   Homicidal Ideation/Attempts: No   Alcohol/Drug Intoxication: No   Hallucinations/Delusions: No   Pending, Active, or Temporary Detention Orders: No    Aggressive/Inappropriate Behavior: No      SAD PERSONS                                                  READMIT RISK TOOL   Support Systems: Child(ren), Family member(s)   Relationship with Primary Physician Group: Seen at least one time within the past 6 months   History of Falls Within Past 3 Months: No   Needs Assistance with Wound Care AND/OR Mgnt of O2, Nebulizer: No   Requires Financial, Physical and/or Educational Assistance With Medications: No   History of Mental Illness: No   Living Alone: No      CARE MANAGEMENT INTERVENTIONS   PCP Verified by CM: Yes                   Transition of Care Consult (CM Consult): Discharge Planning  Discharge Durable Medical Equipment: No               Current Support Network: Relative's Home (lives with her mother)           Plan discussed with Pt/Family/Caregiver: Yes   Freedom of Choice Offered: Yes      DISCHARGE LOCATION   Discharge Placement: Home

## 2014-04-07 NOTE — Progress Notes (Signed)
Transferred to 5W.  Report given.  Plan is home.

## 2014-04-07 NOTE — Other (Signed)
Have met with pt on multiple admissions.  States tooth infection led to higher BG which she could not bring down on own.  Laid down to rest and woke up vomiting in DKA.  Pt states she does have upcoming appt with a Gastroenterologist to address gastroparesis which is most often the reason for admission.

## 2014-04-11 ENCOUNTER — Inpatient Hospital Stay
Admit: 2014-04-11 | Discharge: 2014-04-11 | Disposition: A | Discharge status: Home / Self Care | Payer: MEDICAID | Attending: Emergency Medicine

## 2014-04-11 DIAGNOSIS — E1069 Type 1 diabetes mellitus with other specified complication: Secondary | ICD-10-CM

## 2014-04-11 LAB — METABOLIC PANEL, COMPREHENSIVE
ALT (SGPT): 17 U/L (ref 12–78)
AST (SGOT): 13 U/L — ABNORMAL LOW (ref 15–37)
Albumin: 3.6 gm/dl (ref 3.4–5.0)
Alk. phosphatase: 104 U/L (ref 45–117)
BUN: 9 mg/dl (ref 7–25)
Bilirubin, total: 0.7 mg/dl (ref 0.2–1.0)
CO2: 23 mEq/L (ref 21–32)
Calcium: 9.1 mg/dl (ref 8.5–10.1)
Chloride: 97 mEq/L — ABNORMAL LOW (ref 98–107)
Creatinine: 0.9 mg/dl (ref 0.6–1.3)
GFR est AA: >60
GFR est non-AA: >60
Glucose: 289 mg/dl — ABNORMAL HIGH (ref 74–106)
Potassium: 3.5 mEq/L (ref 3.5–5.1)
Protein, total: 8.1 gm/dl (ref 6.4–8.2)
Sodium: 133 mEq/L — ABNORMAL LOW (ref 136–145)

## 2014-04-11 LAB — POC URINE MACROSCOPIC
Bilirubin: NEGATIVE
Blood: NEGATIVE
Glucose: 500 mg/dl — AB
Ketone: 40 mg/dl — AB
Leukocyte Esterase: NEGATIVE
Nitrites: NEGATIVE
Protein: 100 mg/dl — AB
Specific gravity: 1.015 (ref 1.005–1.030)
Urobilinogen: 0.2 EU/dl (ref 0.0–1.0)
pH (UA): 7 (ref 5–9)

## 2014-04-11 LAB — CBC WITH AUTOMATED DIFF
BASOPHILS: 0.3 % (ref 0–3)
EOSINOPHILS: 0 % (ref 0–5)
HCT: 38.5 % (ref 37.0–50.0)
HGB: 13.2 gm/dl (ref 13.0–17.2)
IMMATURE GRANULOCYTES: 0.3 % (ref 0.0–3.0)
LYMPHOCYTES: 9.7 % — ABNORMAL LOW (ref 28–48)
MCH: 29.5 pg (ref 25.4–34.6)
MCHC: 34.3 gm/dl (ref 30.0–36.0)
MCV: 86.1 fL (ref 80.0–98.0)
MONOCYTES: 2.1 % (ref 1–13)
MPV: 12.2 fL — ABNORMAL HIGH (ref 6.0–10.0)
NEUTROPHILS: 87.6 % — ABNORMAL HIGH (ref 34–64)
NRBC: 0 (ref 0–0)
PLATELET: 333 10*3/uL (ref 140–450)
RBC: 4.47 M/uL (ref 3.60–5.20)
RDW-SD: 39.5 (ref 36.4–46.3)
WBC: 14.4 10*3/uL — ABNORMAL HIGH (ref 4.0–11.0)

## 2014-04-11 LAB — BILIRUBIN, DIRECT: Bilirubin, direct: 0.2 mg/dl (ref 0.0–0.2)

## 2014-04-11 LAB — LIPASE: Lipase: 104 U/L (ref 73–393)

## 2014-04-11 LAB — CULTURE, BLOOD
Blood Culture Result: NO GROWTH
Blood Culture Result: NO GROWTH

## 2014-04-11 LAB — POC HCG,URINE: HCG urine, QL: NEGATIVE

## 2014-04-11 MED ORDER — MORPHINE 4 MG/ML SYRINGE
4 mg/mL | INTRAMUSCULAR | Status: AC
Start: 2014-04-11 — End: 2014-04-11
  Administered 2014-04-11: 08:00:00 via INTRAVENOUS

## 2014-04-11 MED ORDER — SODIUM CHLORIDE 0.9% BOLUS IV
0.9 % | INTRAVENOUS | Status: AC
Start: 2014-04-11 — End: 2014-04-11
  Administered 2014-04-11: 08:00:00 via INTRAVENOUS

## 2014-04-11 MED ORDER — ONDANSETRON (PF) 4 MG/2 ML INJECTION
4 mg/2 mL | Freq: Once | INTRAMUSCULAR | Status: AC
Start: 2014-04-11 — End: 2014-04-11
  Administered 2014-04-11: 08:00:00 via INTRAVENOUS

## 2014-04-11 MED FILL — ONDANSETRON (PF) 4 MG/2 ML INJECTION: 4 mg/2 mL | INTRAMUSCULAR | Qty: 2

## 2014-04-11 MED FILL — MORPHINE 4 MG/ML SYRINGE: 4 mg/mL | INTRAMUSCULAR | Qty: 2

## 2014-04-11 NOTE — ED Notes (Signed)
Patient states she has had abdominal pain that started last night and vomiting that is NOT like her gastroparesis

## 2014-04-11 NOTE — ED Notes (Signed)
6:25 AM  04/11/2014     Discharge instructions given to patient (name) with verbalization of understanding. Patient accompanied by alone.  Patient discharged with the following prescriptions none. Patient discharged to home (destination).      HENRY Clinton (Clint) Susann GivensFRANKLIN, RN

## 2014-04-12 NOTE — ED Provider Notes (Signed)
[facility]  EMERGENCY DEPARTMENT TREATMENT REPORT  NAME:  [name] [name], [name] [name]  SEX:   F  ADMIT: 04/11/2014  DOB:   Dec 24, 1985  MR#    [id number]  ROOM:  [id number]  TIME DICTATED: 09 10 PM  ACCT#  [id number]        CHIEF COMPLAINT:  Abdominal pain.    HISTORY OF PRESENT ILLNESS:  The patient is a 29 year old female with a history of chronic abdominal pain,   gastroparesis, vomiting, type 1 diabetes.  She has a recent history of an   admission for diabetic ketoacidosis.  She reports that she has had left-sided   abdominal pain today with vomiting.  She states it does not feel like her   normal gastroparesis pain.  She denies any diarrhea.  She states that her home   medications are not helping.  As far she knows, her blood sugars have been   fine.    REVIEW OF SYSTEMS:  CONSTITUTIONAL:  No fever, chills or weight loss.   ENT:  No sore throat, runny nose or other URI symptoms.   RESPIRATORY:  No cough, shortness of breath or wheezing.    CARDIOVASCULAR:  No chest pain, chest pressure or palpitations.    GASTROINTESTINAL: See HPI.   GENITOURINARY:  No dysuria, urgency, frequency.  MUSCULOSKELETAL:  Denies joint pain, back pain.  SKIN:  No rashes.  NEUROLOGIC:  Denies any headache, dizziness, syncope, paralysis.  Denies complaints in all other systems.      PAST MEDICAL HISTORY:  Diabetes, diabetic ketoacidosis, diabetic gastroparesis.    MEDICATIONS:  Her current medication list was reviewed and includes Reglan.    ALLERGIES:  PENICILLINS AND ZITHROMAX.    PHYSICAL EXAMINATION:  VITAL SIGNS:  Blood pressure is 163/87, pulse 88, respirations 18, temperature   98.6, O2 sats are 94%.  GENERAL:  The patient is a 29 year old female in no acute distress, not   actively vomiting  ENT:  Mouth/Throat:  Surfaces of the pharynx, palate and tongue are pink,   moist and without lesions.   HEART:  Regular rate and rhythm without murmurs, rubs or gallops.  RESPIRATORY:  Lungs are clear to auscultation in all lung fields.   ABDOMEN:  Soft, nontender, nondistended.  No right lower quadrant, left   quadrant tenderness.  MUSCULOSKELETAL:  She moves all extremities with ease.  NEUROLOGIC:  She has good skin turgor, does not appear dehydrated.  NEUROLOGIC:  Alert, oriented.  Sensation intact, motor strength equal and   symmetric.  There is no facial asymmetry or dysarthria.  PSYCHIATRIC:  Judgment appears appropriate. Recent and remote memory appear to   be intact. Oriented to time, place and person.  Mood and affect appropriate.         INITIAL ASSESSMENT AND PLAN:  Differential diagnosis includes DKA workup was initiated.  We will screen her   with labs initially, treat her symptomatically.  She does have a doctor and   can follow up as an outpatient.  At this time, I do not think she has a   surgical source of her pain.    DIAGNOSTIC STUDIES:  Negative pregnancy.  Urinalysis has glucose and some ketones.  CBC:  White   blood cell count was 14,400.  Bilirubin normal.  CMP was within normal limits,   bicarbonate was 23.  Lipase was normal.    EMERGENCY DEPARTMENT COURSE:  The patient was given fluids, antiemetics, pain medicine.  She had a   significant reduction in her  symptoms.  She was observed for some time in the   ED, over 4 hours.  She did not have any abdominal tenderness or worsening   symptoms.  No further vomiting.  She was discharged home in stable condition.    DIAGNOSES:  1. Non-intractable vomiting.  2. Abdominal pain left lower quadrant.  3. Type 1 diabetes.    DISPOSITION:  Discharged to home in stable condition.  The patient was personally evaluated   by myself and Dr. Delton See who agrees with the above assessment and plan.      ___________________  Gwenyth Allegra MD  Dictated By: Crecencio Mc, PA-C    My signature above authenticates this document and my orders, the final  diagnosis (es), discharge prescription (s), and instructions in the [facility]   Pulsecheck record.   Nursing notes have been reviewed by the physician/mid-level provider.    If you have any questions please contact [phone].    [facility]  D:04/11/2014 21:10:12  T: 04/12/2014 09:02:18  [id number]

## 2014-04-13 ENCOUNTER — Inpatient Hospital Stay
Admit: 2014-04-13 | Discharge: 2014-04-13 | Disposition: A | Discharge status: Home / Self Care | Payer: MEDICAID | Attending: Emergency Medicine

## 2014-04-13 DIAGNOSIS — G8929 Other chronic pain: Secondary | ICD-10-CM

## 2014-04-13 LAB — METABOLIC PANEL, COMPREHENSIVE
ALT (SGPT): 12 U/L (ref 12–78)
AST (SGOT): 10 U/L — ABNORMAL LOW (ref 15–37)
Albumin: 3.6 gm/dl (ref 3.4–5.0)
Alk. phosphatase: 97 U/L (ref 45–117)
BUN: 12 mg/dl (ref 7–25)
Bilirubin, total: 0.4 mg/dl (ref 0.2–1.0)
CO2: 23 mEq/L (ref 21–32)
Calcium: 8.7 mg/dl (ref 8.5–10.1)
Chloride: 100 mEq/L (ref 98–107)
Creatinine: 0.9 mg/dl (ref 0.6–1.3)
GFR est AA: >60
GFR est non-AA: >60
Glucose: 295 mg/dl — ABNORMAL HIGH (ref 74–106)
Potassium: 3.9 mEq/L (ref 3.5–5.1)
Protein, total: 7.5 gm/dl (ref 6.4–8.2)
Sodium: 132 mEq/L — ABNORMAL LOW (ref 136–145)

## 2014-04-13 LAB — POC URINE MACROSCOPIC
Bilirubin: NEGATIVE
Blood: NEGATIVE
Glucose: 500 mg/dl — AB
Ketone: 15 mg/dl — AB
Leukocyte Esterase: NEGATIVE
Nitrites: NEGATIVE
Protein: 30 mg/dl — AB
Specific gravity: 1.02 (ref 1.005–1.030)
Urobilinogen: 0.2 EU/dl (ref 0.0–1.0)
pH (UA): 7.5 (ref 5–9)

## 2014-04-13 LAB — CBC WITH AUTOMATED DIFF
BASOPHILS: 0.7 % (ref 0–3)
EOSINOPHILS: 0 % (ref 0–5)
HCT: 35 % — ABNORMAL LOW (ref 37.0–50.0)
HGB: 11.8 gm/dl — ABNORMAL LOW (ref 13.0–17.2)
IMMATURE GRANULOCYTES: 0.3 % (ref 0.0–3.0)
LYMPHOCYTES: 20.6 % — ABNORMAL LOW (ref 28–48)
MCH: 29.6 pg (ref 25.4–34.6)
MCHC: 33.7 gm/dl (ref 30.0–36.0)
MCV: 87.7 fL (ref 80.0–98.0)
MONOCYTES: 4.6 % (ref 1–13)
MPV: 11.6 fL — ABNORMAL HIGH (ref 6.0–10.0)
NEUTROPHILS: 73.8 % — ABNORMAL HIGH (ref 34–64)
NRBC: 0 (ref 0–0)
PLATELET: 328 10*3/uL (ref 140–450)
RBC: 3.99 M/uL (ref 3.60–5.20)
RDW-SD: 40.9 (ref 36.4–46.3)
WBC: 9.2 10*3/uL (ref 4.0–11.0)

## 2014-04-13 LAB — POC HCG,URINE: HCG urine, QL: NEGATIVE

## 2014-04-13 LAB — LIPASE: Lipase: 136 U/L (ref 73–393)

## 2014-04-13 MED ORDER — SODIUM CHLORIDE 0.9 % IJ SYRG
Freq: Once | INTRAMUSCULAR | Status: AC
Start: 2014-04-13 — End: 2014-04-13
  Administered 2014-04-13: 17:00:00 via INTRAVENOUS

## 2014-04-13 MED ORDER — ONDANSETRON (PF) 4 MG/2 ML INJECTION
4 mg/2 mL | Freq: Once | INTRAMUSCULAR | Status: AC
Start: 2014-04-13 — End: 2014-04-13
  Administered 2014-04-13: 17:00:00 via INTRAVENOUS

## 2014-04-13 MED ORDER — SUCRALFATE 100 MG/ML ORAL SUSP
100 mg/mL | ORAL | Status: AC
Start: 2014-04-13 — End: 2014-04-13
  Administered 2014-04-13: 18:00:00 via ORAL

## 2014-04-13 MED ORDER — MORPHINE 4 MG/ML SYRINGE
4 mg/mL | INTRAMUSCULAR | Status: AC
Start: 2014-04-13 — End: 2014-04-13
  Administered 2014-04-13: 17:00:00 via INTRAVENOUS

## 2014-04-13 MED ORDER — SODIUM CHLORIDE 0.9% BOLUS IV
0.9 % | INTRAVENOUS | Status: AC
Start: 2014-04-13 — End: 2014-04-13
  Administered 2014-04-13: 17:00:00 via INTRAVENOUS

## 2014-04-13 MED FILL — MORPHINE 4 MG/ML SYRINGE: 4 mg/mL | INTRAMUSCULAR | Qty: 2

## 2014-04-13 MED FILL — SUCRALFATE 100 MG/ML ORAL SUSP: 100 mg/mL | ORAL | Qty: 10

## 2014-04-13 MED FILL — ONDANSETRON (PF) 4 MG/2 ML INJECTION: 4 mg/2 mL | INTRAMUSCULAR | Qty: 2

## 2014-04-13 MED FILL — BD POSIFLUSH NORMAL SALINE 0.9 % INJECTION SYRINGE: INTRAMUSCULAR | Qty: 10

## 2014-04-13 NOTE — ED Notes (Signed)
I have reviewed discharge instructions with the patient.  The patient verbalized understanding.

## 2014-04-13 NOTE — Progress Notes (Signed)
Code r    Pt back in ed with abd pain, discharged home with no needs. Labs wnl. Was in yesterday 3/16 as well and discharged.

## 2014-04-13 NOTE — ED Notes (Signed)
Pt to ER due abd pain and N/V. Pt seen for same a few days ago

## 2014-04-13 NOTE — ED Provider Notes (Signed)
[facility]  EMERGENCY DEPARTMENT TREATMENT REPORT  NAME:  [name] [name]  SEX:   F  ADMIT: 04/13/2014  DOB:   31-Aug-1985  MR#    [id number]  ROOM:  [id number]  TIME DICTATED: 01 15 PM  ACCT#  [id number]    cc: Jacob Moores MD, Jerral Bonito MD    TIME OF EVALUATION:  1152    CHIEF COMPLAINT:  Abdominal pain, nausea, vomiting.    HISTORY OF PRESENT ILLNESS:  This 29 year old female presents for the second time this week for evaluation   of abdominal pain, nausea and vomiting.  Her symptoms are in the left upper   and left lower quadrant but primarily in the left upper.  She started to feel   it a little bit last night but woke up today with the abdominal pain.  She   states it is a constant crampy, twisting pain that is nonradiating.  It is   associated with 5 episodes of vomiting.  Every time she tries to take in any   food or drink she vomits.   She denies any bilious vomiting or hematemesis.    She has no diarrhea or constipation.  She does suffer from gastroparesis and   is an insulin-dependent diabetic.  States her sugar this morning was 204.    Denies any polyuria, polyphagia or polydipsia.  No urinary symptoms.  When she   was evaluated here on 04/11/2014, she was diagnosed with non intractable   vomiting and nausea.  On 03/09 she was admitted to the hospital for DKA   without coma.    REVIEW OF SYSTEMS:  CONSTITUTIONAL:  No fever, chills, or weight loss.  EYES:  No visual symptoms.   ENT:  No sore throat, runny nose, or other URI symptoms.   ENDOCRINE:  No diabetic symptoms.   HEMATOLOGIC:  No excessive bruising.  RESPIRATORY:  No cough, shortness of breath or wheezing.  CARDIOVASCULAR:  No chest pain, chest pressure, or palpitations.   GASTROINTESTINAL:   Per HPI.  GENITOURINARY:  No dysuria, frequency, or urgency.  MUSCULOSKELETAL:  No joint pain or swelling.   INTEGUMENTARY:  No rashes.  NEUROLOGICAL:  No headaches, sensory or motor symptoms.     PAST MEDICAL HISTORY:   Diabetes, insulin-dependent, gastroparesis, C-section.  Depo injection.    SOCIAL HISTORY:  No tobacco, ETOH or recreational drugs.      ALLERGIES:  PENICILLIN AND ZITHROMAX.    MEDICATIONS:  Amlodipine, Coreg, Celexa, Cleocin, Lantus, Apidra, SoloSTAR, Zestril and   Reglan.    PHYSICAL EXAMINATION:  VITAL SIGNS:  Blood pressure 188/114, pulse 108, respirations 18, temperature   98.6, O2 sats 99%.  GENERAL:  The patient is a well-developed, well-nourished Caucasian female.    She keeps going in and out of the bathroom to spit up.  I finally had to   convince the patient to stay in the room so we could examine her and start an   IV.  She is awake, alert, and oriented, answering questions appropriately.    Nontoxic appearing.  HEENT:  Eyes:  Anicteric.  Pupils equal, symmetrical and normally reactive.    Mouth/Throat:  Buccal mucosa slightly dry.  Posterior pharynx without erythema   or lesions.  NECK:  Supple, nontender, symmetrical.  No cervical or submandibular   lymphadenopathy palpated.  RESPIRATORY:  Clear and equal breath sounds.  No respiratory distress,   tachypnea, or accessory muscle use.    CARDIOVASCULAR:  Increased rate, regular rhythm.  No  murmurs, gallops, rubs or   thrills.  GASTROINTESTINAL:  Bowel sounds present.  Abdomen is soft.  She has moderate   tenderness in the left upper quadrant without guarding, rebound or rigidity.    No organomegaly.  No abdominal masses appreciated by inspection or palpation.  MUSCULOSKELETAL:  Back:  No CVA tenderness.  SKIN:  Warm and dry, no rashes.  NEUROLOGIC:  Alert, oriented.  Sensation intact, motor strength equal and   symmetric.    CONTINUATION BY CATHERINE WALGREN, PA-C:    INITIAL ASSESSMENT AND MANAGEMENT PLAN:  This 29 year old female presents with acute on chronic abdominal pain.  She   has already had some visit here on 03/09 and 0/15.  On the first visit, she   was diagnosed with DKA and on the second visit she was diagnosed with acute    abdominal pain that improved on pain medication.  We will check labs to see if   the patient is in DKA today.  She states her blood sugar at home was 204   which is reassuring.  Her pain is mainly located in the left upper quadrant.    She has no guarding or rebound.  This does not appear to be an acute abdomen.    We will medicate her with some morphine and Zofran, hydrate her with a liter   of fluids and continue to monitor.    DIAGNOSTIC STUDIES:  CBC:  Hemoglobin is 11.8, is 35%.  She has 73.8% neutrophils and 20.6%   lymphocytes.  Her urine shows 500+ glucose, 30 protein and 15 ketones,   negative nitrites, leukocytes and blood.  Her chemistry shows sodium of 132,   blood sugar is 295.  Urine pregnancy is negative.    COURSE IN THE EMERGENCY DEPARTMENT:  When I first recheck the patient after her pain medication, her symptoms   improved dramatically.  She had no vomiting here in the ER and she rested   comfortably and played on her phone.  When I rechecked her again after an   hour, she was complaining of pain again, but states she is also having some   discomfort in her throat.  It is not itching.  It just feels like she has some   acid reflux.  I will give her some Carafate and reassess.    REEVALUATION:  The patient is still having a little bit of pain, but I explained to her that   she would have to follow up with her primary care doctor and her   endocrinologist for continued evaluation.  Suspect this is all related to her   diabetes and gastroparesis, but reassured her she is not currently in DKA.    Recommend she closely monitor her blood sugars and take her insulin   accordingly.    FINAL DIAGNOSES:  1. Acute on chronic abdominal pain.  2. Vomiting.      DISPOSITION AND PLAN:  The patient is discharged home in stable condition, with instructions to   follow up with their regular doctor.  They are advised to return immediately   for any worsening or symptoms of concern.  The patient is to follow up  with   Dr. Dion BodyZhao and Dr. Martha ClanMagoon.  She will return for any worsening abdominal pain,   fever, inability to tolerate p.o. fluids or worsening vomiting or dehydration.    The patient was personally evaluated by myself and Dr. Carmela HurtFickenscher who   agrees with the above assessment and plan.  ___________________  Christiana Pellant MD  Dictated By: Geoffry Paradise. Katheran James, PA-C    My signature above authenticates this document and my orders, the final  diagnosis (es), discharge prescription (s), and instructions in the [facility]   Pulsecheck record.  Nursing notes have been reviewed by the physician/mid-level provider.    If you have any questions please contact [phone].    BB  D:04/13/2014 13:15:32  T: 04/13/2014 14:15:40  [id number]

## 2014-05-02 DIAGNOSIS — E1010 Type 1 diabetes mellitus with ketoacidosis without coma: Secondary | ICD-10-CM

## 2014-05-02 NOTE — ED Notes (Signed)
Dizziness, feeling weak, nausea and vomiting all day

## 2014-05-02 NOTE — ED Notes (Signed)
Pt reports n/v all day, very fatigued, states " I passed out 2 times" pt is a diabetic.

## 2014-05-03 ENCOUNTER — Emergency Department: Admit: 2014-05-03 | Payer: MEDICAID

## 2014-05-03 ENCOUNTER — Inpatient Hospital Stay
Admit: 2014-05-03 | Discharge: 2014-05-04 | Disposition: A | Discharge status: Home / Self Care | Payer: MEDICAID | Attending: Hospitalist | Admitting: Hospitalist

## 2014-05-03 LAB — POC BLOOD GAS + LACTIC ACID
BASE EXCESS: -7 mmol/L — ABNORMAL LOW (ref <=3)
BICARBONATE: 20.1 mmol/L — ABNORMAL LOW (ref 22.0–26.0)
CO2, TOTAL: 21 mmol/L (ref 21–32)
Lactic Acid: 1.38 mmol/L (ref 0.40–2.00)
O2 SAT: 78 % — ABNORMAL HIGH (ref 70–75)
PCO2: 42.3 mm Hg (ref 41.0–51.0)
PO2: 47 mm Hg — ABNORMAL HIGH (ref 35–40)
pH: 7.285 — ABNORMAL LOW (ref 7.310–7.410)

## 2014-05-03 LAB — TSH 3RD GENERATION: TSH: 0.379 u[IU]/mL (ref 0.358–3.740)

## 2014-05-03 LAB — METABOLIC PANEL, BASIC
BUN: 11 mg/dl (ref 7–25)
CO2: 21 mEq/L (ref 21–32)
Calcium: 8.3 mg/dl — ABNORMAL LOW (ref 8.5–10.1)
Chloride: 108 mEq/L — ABNORMAL HIGH (ref 98–107)
Creatinine: 0.7 mg/dl (ref 0.6–1.3)
GFR est AA: >60
GFR est non-AA: >60
Glucose: 145 mg/dl — ABNORMAL HIGH (ref 74–106)
Potassium: 4.1 mEq/L (ref 3.5–5.1)
Sodium: 137 mEq/L (ref 136–145)

## 2014-05-03 LAB — CBC WITH AUTOMATED DIFF
BASOPHILS: 0.3 % (ref 0–3)
BASOPHILS: 0.4 % (ref 0–3)
EOSINOPHILS: 0 % (ref 0–5)
EOSINOPHILS: 0 % (ref 0–5)
HCT: 38.3 % (ref 37.0–50.0)
HCT: 43.3 % (ref 37.0–50.0)
HGB: 12.3 gm/dl — ABNORMAL LOW (ref 13.0–17.2)
HGB: 13.7 gm/dl (ref 13.0–17.2)
IMMATURE GRANULOCYTES: 0.3 % (ref 0.0–3.0)
IMMATURE GRANULOCYTES: 0.3 % (ref 0.0–3.0)
LYMPHOCYTES: 7.6 % — ABNORMAL LOW (ref 28–48)
LYMPHOCYTES: 9.1 % — ABNORMAL LOW (ref 28–48)
MCH: 28.2 pg (ref 25.4–34.6)
MCH: 28.2 pg (ref 25.4–34.6)
MCHC: 31.6 gm/dl (ref 30.0–36.0)
MCHC: 32.1 gm/dl (ref 30.0–36.0)
MCV: 87.8 fL (ref 80.0–98.0)
MCV: 89.3 fL (ref 80.0–98.0)
MONOCYTES: 1.9 % (ref 1–13)
MONOCYTES: 1.9 % (ref 1–13)
MPV: 12.2 fL — ABNORMAL HIGH (ref 6.0–10.0)
MPV: 13.1 fL — ABNORMAL HIGH (ref 6.0–10.0)
NEUTROPHILS: 88.4 % — ABNORMAL HIGH (ref 34–64)
NEUTROPHILS: 89.8 % — ABNORMAL HIGH (ref 34–64)
NRBC: 0 (ref 0–0)
NRBC: 0 (ref 0–0)
PLATELET: 250 10*3/uL (ref 140–450)
PLATELET: 271 10*3/uL (ref 140–450)
RBC: 4.36 M/uL (ref 3.60–5.20)
RBC: 4.85 M/uL (ref 3.60–5.20)
RDW-SD: 40.7 (ref 36.4–46.3)
RDW-SD: 41.4 (ref 36.4–46.3)
WBC: 11.7 10*3/uL — ABNORMAL HIGH (ref 4.0–11.0)
WBC: 13.9 10*3/uL — ABNORMAL HIGH (ref 4.0–11.0)

## 2014-05-03 LAB — PHOSPHORUS: Phosphorus: 3.1 mg/dl (ref 2.5–4.9)

## 2014-05-03 LAB — GLUCOSE, POC
Glucose (POC): 291 mg/dL — ABNORMAL HIGH (ref 65–105)
Glucose (POC): 214 mg/dL — ABNORMAL HIGH (ref 65–105)
Glucose (POC): 161 mg/dL — ABNORMAL HIGH (ref 65–105)
Glucose (POC): 183 mg/dL — ABNORMAL HIGH (ref 65–105)
Glucose (POC): 181 mg/dL — ABNORMAL HIGH (ref 65–105)
Glucose (POC): 173 mg/dL — ABNORMAL HIGH (ref 65–105)
Glucose (POC): 101 mg/dL (ref 65–105)
Glucose (POC): 118 mg/dL — ABNORMAL HIGH (ref 65–105)
Glucose (POC): 112 mg/dL — ABNORMAL HIGH (ref 65–105)
Glucose (POC): 123 mg/dL — ABNORMAL HIGH (ref 65–105)
Glucose (POC): 130 mg/dL — ABNORMAL HIGH (ref 65–105)
Glucose (POC): 165 mg/dL — ABNORMAL HIGH (ref 65–105)
Glucose (POC): 140 mg/dL — ABNORMAL HIGH (ref 65–105)
Glucose (POC): 162 mg/dL — ABNORMAL HIGH (ref 65–105)
Glucose (POC): 158 mg/dL — ABNORMAL HIGH (ref 65–105)

## 2014-05-03 LAB — POC URINE MACROSCOPIC
Bilirubin: NEGATIVE
Glucose: NEGATIVE mg/dl
Ketone: 80 mg/dl — AB
Leukocyte Esterase: NEGATIVE
Nitrites: NEGATIVE
Protein: 100 mg/dl — AB
Specific gravity: 1.025 (ref 1.005–1.030)
Urobilinogen: 0.2 EU/dl (ref 0.0–1.0)
pH (UA): 5 (ref 5–9)

## 2014-05-03 LAB — HEPATIC FUNCTION PANEL
ALT (SGPT): 20 U/L (ref 12–78)
AST (SGOT): 20 U/L (ref 15–37)
Albumin: 3.7 gm/dl (ref 3.4–5.0)
Alk. phosphatase: 127 U/L — ABNORMAL HIGH (ref 45–117)
Bilirubin, direct: 0.2 mg/dl (ref 0.0–0.2)
Bilirubin, total: 0.7 mg/dl (ref 0.2–1.0)
Protein, total: 8.5 gm/dl — ABNORMAL HIGH (ref 6.4–8.2)

## 2014-05-03 LAB — POC CHEM8
BUN: 27 mg/dl — ABNORMAL HIGH (ref 7–25)
BUN: 19 mg/dl (ref 7–25)
CALCIUM,IONIZED: 4.6 mg/dL (ref 4.40–5.40)
CALCIUM,IONIZED: 5 mg/dL (ref 4.40–5.40)
CO2, TOTAL: 17 mmol/L — ABNORMAL LOW (ref 21–32)
CO2, TOTAL: 17 mmol/L — ABNORMAL LOW (ref 21–32)
Chloride: 102 mEq/L (ref 98–107)
Chloride: 110 mEq/L — ABNORMAL HIGH (ref 98–107)
Creatinine: 1 mg/dl (ref 0.6–1.3)
Creatinine: 0.8 mg/dl (ref 0.6–1.3)
Glucose: 428 mg/dL — CR (ref 74–106)
Glucose: 183 mg/dL — ABNORMAL HIGH (ref 74–106)
HCT: 46 % — ABNORMAL HIGH (ref 38–45)
HCT: 31 % — ABNORMAL LOW (ref 38–45)
HGB: 15.6 gm/dl (ref 13.0–17.2)
HGB: 10.5 gm/dl — ABNORMAL LOW (ref 13.0–17.2)
Potassium: 5 mEq/L — ABNORMAL HIGH (ref 3.5–4.9)
Potassium: 4.3 mEq/L (ref 3.5–4.9)
Sodium: 136 mEq/L (ref 136–145)
Sodium: 141 mEq/L (ref 136–145)

## 2014-05-03 LAB — EKG, 12 LEAD, INITIAL
Atrial Rate: 125 {beats}/min
Calculated P Axis: 73 degrees
Calculated R Axis: 65 degrees
Calculated T Axis: 57 degrees
P-R Interval: 138 ms
Q-T Interval: 316 ms
QRS Duration: 68 ms
QTC Calculation (Bezet): 456 ms
Ventricular Rate: 125 {beats}/min

## 2014-05-03 LAB — MAGNESIUM: Magnesium: 2 mg/dl (ref 1.8–2.4)

## 2014-05-03 LAB — TROPONIN I
Troponin-I: <0.015 ng/ml (ref 0.00–0.09)
Troponin-I: <0.015 ng/ml (ref 0.00–0.09)

## 2014-05-03 LAB — LIPASE: Lipase: 60 U/L — ABNORMAL LOW (ref 73–393)

## 2014-05-03 LAB — HEMOGLOBIN A1C WITH EAG: Hemoglobin A1c: 8.9 % — ABNORMAL HIGH (ref 4.8–6.0)

## 2014-05-03 LAB — LACTIC ACID: Lactic Acid: 1 mmol/L (ref 0.4–2.0)

## 2014-05-03 MED ORDER — INSULIN REGULAR HUMAN 100 UNIT/ML INJECTION
100 unit/mL | INTRAMUSCULAR | Status: DC
Start: 2014-05-03 — End: 2014-05-03
  Administered 2014-05-03 (×7): via INTRAVENOUS

## 2014-05-03 MED ORDER — ONDANSETRON (PF) 4 MG/2 ML INJECTION
4 mg/2 mL | Freq: Once | INTRAMUSCULAR | Status: AC
Start: 2014-05-03 — End: 2014-05-03
  Administered 2014-05-03: 04:00:00 via INTRAVENOUS

## 2014-05-03 MED ORDER — MORPHINE 4 MG/ML SYRINGE
4 mg/mL | INTRAMUSCULAR | Status: AC
Start: 2014-05-03 — End: 2014-05-03
  Administered 2014-05-03: 04:00:00 via INTRAVENOUS

## 2014-05-03 MED ORDER — SODIUM CHLORIDE 0.9 % IV
Freq: Once | INTRAVENOUS | Status: AC
Start: 2014-05-03 — End: 2014-05-03
  Administered 2014-05-03: 07:00:00 via INTRAVENOUS

## 2014-05-03 MED ORDER — LISINOPRIL 20 MG TAB
20 mg | Freq: Every day | ORAL | Status: DC
Start: 2014-05-03 — End: 2014-05-04
  Administered 2014-05-03 – 2014-05-04 (×2): via ORAL

## 2014-05-03 MED ORDER — DIPHENHYDRAMINE HCL 50 MG/ML IJ SOLN
50 mg/mL | Freq: Once | INTRAMUSCULAR | Status: AC
Start: 2014-05-03 — End: 2014-05-03
  Administered 2014-05-03: 09:00:00 via INTRAVENOUS

## 2014-05-03 MED ORDER — DEXTROSE 50% IN WATER (D50W) IV SYRG
INTRAVENOUS | Status: DC | PRN
Start: 2014-05-03 — End: 2014-05-04

## 2014-05-03 MED ORDER — INSULIN LISPRO 100 UNIT/ML INJECTION
100 unit/mL | Freq: Four times a day (QID) | SUBCUTANEOUS | Status: DC
Start: 2014-05-03 — End: 2014-05-04
  Administered 2014-05-04 (×3): via SUBCUTANEOUS

## 2014-05-03 MED ORDER — SODIUM CHLORIDE 0.9 % IJ SYRG
Freq: Once | INTRAMUSCULAR | Status: AC
Start: 2014-05-03 — End: 2014-05-03
  Administered 2014-05-03: 04:00:00 via INTRAVENOUS

## 2014-05-03 MED ORDER — SODIUM CHLORIDE 0.9 % IV
INTRAVENOUS | Status: DC
Start: 2014-05-03 — End: 2014-05-03
  Administered 2014-05-03: 08:00:00 via INTRAVENOUS

## 2014-05-03 MED ORDER — DIPHENHYDRAMINE 25 MG CAP
25 mg | Freq: Four times a day (QID) | ORAL | Status: DC | PRN
Start: 2014-05-03 — End: 2014-05-03

## 2014-05-03 MED ORDER — PHARMACY INFORMATION NOTE
Status: AC
Start: 2014-05-03 — End: 2014-05-03

## 2014-05-03 MED ORDER — MORPHINE 4 MG/ML SYRINGE
4 mg/mL | INTRAMUSCULAR | Status: AC
Start: 2014-05-03 — End: 2014-05-03
  Administered 2014-05-03: 06:00:00 via INTRAVENOUS

## 2014-05-03 MED ORDER — MORPHINE 2 MG/ML INJECTION
2 mg/mL | INTRAMUSCULAR | Status: DC | PRN
Start: 2014-05-03 — End: 2014-05-04
  Administered 2014-05-03 – 2014-05-04 (×7): via INTRAVENOUS

## 2014-05-03 MED ORDER — SODIUM CHLORIDE 0.9 % IV
INTRAVENOUS | Status: DC
Start: 2014-05-03 — End: 2014-05-03
  Administered 2014-05-03: 13:00:00 via INTRAVENOUS

## 2014-05-03 MED ORDER — ONDANSETRON (PF) 4 MG/2 ML INJECTION
4 mg/2 mL | Freq: Four times a day (QID) | INTRAMUSCULAR | Status: DC | PRN
Start: 2014-05-03 — End: 2014-05-04
  Administered 2014-05-04 (×2): via INTRAVENOUS

## 2014-05-03 MED ORDER — ELECTROLYTE REPLACEMENT PROTOCOL
Status: DC | PRN
Start: 2014-05-03 — End: 2014-05-03

## 2014-05-03 MED ORDER — INSULIN GLARGINE 100 UNIT/ML INJECTION
100 unit/mL | Freq: Every evening | SUBCUTANEOUS | Status: DC
Start: 2014-05-03 — End: 2014-05-04
  Administered 2014-05-03 – 2014-05-04 (×2): via SUBCUTANEOUS

## 2014-05-03 MED ORDER — PROMETHAZINE IN NS 12.5 MG/50 ML IV PIGGY BAG
12.5 mg/50 ml | Freq: Four times a day (QID) | INTRAVENOUS | Status: AC | PRN
Start: 2014-05-03 — End: 2014-05-04
  Administered 2014-05-03 – 2014-05-04 (×2): via INTRAVENOUS

## 2014-05-03 MED ORDER — GLUCAGON 1 MG INJECTION
1 mg | INTRAMUSCULAR | Status: DC | PRN
Start: 2014-05-03 — End: 2014-05-04

## 2014-05-03 MED ORDER — D5-1/2 NS & POTASSIUM CHLORIDE 20 MEQ/L IV
20 mEq/L | INTRAVENOUS | Status: DC
Start: 2014-05-03 — End: 2014-05-03
  Administered 2014-05-03: 09:00:00 via INTRAVENOUS

## 2014-05-03 MED ORDER — DIPHENHYDRAMINE 50 MG CAP
50 mg | Freq: Four times a day (QID) | ORAL | Status: DC | PRN
Start: 2014-05-03 — End: 2014-05-04
  Administered 2014-05-03: 16:00:00 via ORAL

## 2014-05-03 MED ORDER — SODIUM CHLORIDE 0.9% BOLUS IV
0.9 % | INTRAVENOUS | Status: AC
Start: 2014-05-03 — End: 2014-05-03
  Administered 2014-05-03: 04:00:00 via INTRAVENOUS

## 2014-05-03 MED FILL — MORPHINE 2 MG/ML INJECTION: 2 mg/mL | INTRAMUSCULAR | Qty: 1

## 2014-05-03 MED FILL — DIPHENHYDRAMINE 25 MG CAP: 25 mg | ORAL | Qty: 1

## 2014-05-03 MED FILL — SODIUM CHLORIDE 0.9 % IV: INTRAVENOUS | Qty: 1000

## 2014-05-03 MED FILL — ELECTROLYTE REPLACEMENT PROTOCOL: Qty: 1

## 2014-05-03 MED FILL — ONDANSETRON (PF) 4 MG/2 ML INJECTION: 4 mg/2 mL | INTRAMUSCULAR | Qty: 2

## 2014-05-03 MED FILL — MORPHINE 4 MG/ML SYRINGE: 4 mg/mL | INTRAMUSCULAR | Qty: 2

## 2014-05-03 MED FILL — DIPHENHYDRAMINE 50 MG CAP: 50 mg | ORAL | Qty: 1

## 2014-05-03 MED FILL — INSULIN GLARGINE 100 UNIT/ML INJECTION: 100 unit/mL | SUBCUTANEOUS | Qty: 1

## 2014-05-03 MED FILL — LISINOPRIL 20 MG TAB: 20 mg | ORAL | Qty: 1

## 2014-05-03 MED FILL — DIPHENHYDRAMINE HCL 50 MG/ML IJ SOLN: 50 mg/mL | INTRAMUSCULAR | Qty: 1

## 2014-05-03 MED FILL — PHARMACY INFORMATION NOTE: Qty: 1

## 2014-05-03 MED FILL — INSULIN REGULAR HUMAN 100 UNIT/ML INJECTION: 100 unit/mL | INTRAMUSCULAR | Qty: 1

## 2014-05-03 MED FILL — PROMETHAZINE IN NS 12.5 MG/50 ML IV PIGGY BAG: 12.5 mg/50 ml | INTRAVENOUS | Qty: 50

## 2014-05-03 MED FILL — MORPHINE 4 MG/ML SYRINGE: 4 mg/mL | INTRAMUSCULAR | Qty: 1

## 2014-05-03 NOTE — Other (Signed)
TRANSFER - IN REPORT:    [facility]name[facility] on [name] [name]  being received from ICU4 (unit) for routine progression of care      Report consisted of patient???s Situation, Background, Assessment and   Recommendations(SBAR).     Information from the following report(s) SBAR, Kardex and MAR was reviewed with the receiving nurse.    Opportunity for questions and clarification was provided.

## 2014-05-03 NOTE — Progress Notes (Signed)
Pt code r last night with no ed coverage available.   She was admitted for the same reason as last time. Dka.   inpt seems appropriate, will advise floor cm to do ra review and ur , downgrade if not meeting.

## 2014-05-03 NOTE — ED Notes (Signed)
TRANSFER - OUT REPORT:    Verbal report given to Hellen RN(name) on Naseem S Buller  being transferred to ICU 4(unit) for routine progression of care       Report consisted of patient???s Situation, Background, Assessment and   Recommendations(SBAR).     Information from the following report(s) SBAR, Kardex and ED Summary was reviewed with the receiving nurse.    Lines:   Peripheral IV 05/03/14 Right Antecubital (Active)       Peripheral IV 05/03/14 Left Hand (Active)   Site Assessment Clean, dry, & intact 05/03/2014  4:17 AM   Phlebitis Assessment 0 05/03/2014  4:17 AM   Infiltration Assessment 0 05/03/2014  4:17 AM   Dressing Status Clean, dry, & intact 05/03/2014  4:17 AM   Dressing Type Transparent 05/03/2014  4:17 AM        Opportunity for questions and clarification was provided.      Patient transported with:   Monitor  Registered Nurse

## 2014-05-03 NOTE — Other (Signed)
Bedside and Verbal shift change report given to CAREL Geryl CouncilmanBULOSAN MCCOY, RN   (oncoming nurse) by Magnus IvanHelen Malpaya, RN (offgoing nurse). Report included the following information SBAR.

## 2014-05-03 NOTE — ED Provider Notes (Signed)
[facility]  EMERGENCY DEPARTMENT TREATMENT REPORT  NAME:  [place], [place]  SEX:   F  ADMIT: 05/02/2014  DOB:   06-04-1985  MR#    [id number]  ROOM:  [id number]  TIME DICTATED: 12 10 AM  ACCT#  [id number]        I hereby certify this patient for admission based upon medical necessity as   noted below:    CHIEF COMPLAINT:  Gastroparesis.    HISTORY OF PRESENT ILLNESS:  This is a 29 year old diabetic female with history of gastroparesis.  Stated   that her gastroparesis started acting up early this morning.  She fell and   injured her back.  She says she has been vomiting all day and sleeping.  She   has not checked her blood sugar.  She has no diarrhea, no constipation, no   urinary symptoms, no fevers.    REVIEW OF SYSTEMS:  CONSTITUTIONAL:  No fever, chills, weight loss.  EYES: No visual symptoms.  ENT: No sore throat, runny nose or other URI symptoms.  ENDOCRINE:  Diabetic symptoms.  HEMATOLOGIC/LYMPHATIC:  No excessive bruising or lymph node swelling.  ALLERGIC/IMMUNOLOGIC:  No urticaria or allergy symptoms.  RESPIRATORY:  No cough, shortness of breath, or wheezing.  CARDIOVASCULAR:  No chest pain, chest pressure, or palpitations.  GASTROINTESTINAL:  No diarrhea, or abdominal pain.  Nausea and vomiting.  GENITOURINARY:  No dysuria, frequency, or urgency.  MUSCULOSKELETAL:  No joint pain or swelling.  INTEGUMENTARY:  No rashes.  NEUROLOGICAL:  No headaches, sensory or motor symptoms.     COURSE IN THE EMERGENCY DEPARTMENT:  We will go ahead and get rapid i-STAT to make sure the patient is not in   diabetic ketoacidosis, get hepatic panel, lipase, urine and a liter of fluid.    Will give her 4 of morphine, 4 of Zofran for the pain and nausea and   reevaluate her afterwards.    CONTINUATION BY Ambriel Gorelick A. Hady Niemczyk, MD:    PHYSICAL EXAMINATION:  VITAL SIGNS:  Temperature 98.4, pulse 138, respirations 22, blood pressure   138/92, sats 99% on room air.   GENERAL APPEARANCE:  Well-developed, well-nourished black female, awake, alert   and oriented.  HEENT:  Head is normocephalic.  NECK:  Supple.  CHEST:  Clear to auscultation bilaterally.  HEART:  Tachycardic, but regular.  ABDOMEN:  Soft, nondistended.  MUSCULOSKELETAL:  Moves all extremities without difficulty.  SKIN:  Warm and dry without rash.    NEUROLOGIC:  Awake, alert and appropriate.      CONTINUATION BY NELSON SANTIAGO, PA-C:    The patient's laboratory results came back.  WBC of 11.7, hemoglobin and   hematocrit is 12 and 38, platelets are 250, segs are 88.  Lipase was 60,   alkaline phosphatase 127, total protein of 8.5.  The patient has lactic acid   came back at 1.38, normal.  The patient's venous blood gas 7.285, CO2 of 21.    Base excess of -7.  The patient's chest x-ray showed no acute cardiopulmonary   disease.  EKG is normal sinus rhythm with no ST elevation or depression.  The   patient received 2 liter of fluid, was placed on DKA protocol, insulin   infusion.  Her last blood sugar at 3:00 in the morning was 214; it was 291 at   2:00 and 214 at 3:10.  The patient is assigned to the hospitalist.      FINAL DIAGNOSIS:  Diabetic ketoacidosis without coma associated with  type 1 diabetes mellitus.      CONTINUATION BY Salia Cangemi, MD:    COURSE IN EMERGENCY DEPARTMENT:  The patient was seen by me.  Briefly, a 29 year old female who presented to   the ER today complaining of significant nausea and vomiting, has a history of   gastroparesis, has recurrent nausea with vomiting.  We will obtain basic labs   and proceed accordingly.     DIAGNOSTIC INTERPRETATIONS:  The patient's white count was 13.9, H&H and platelets were unremarkable.  Her   urine was negative for infection.  Her chemistry revealed a low CO2 at 17.     Her potassium was 5.  BUN was 27.  LFTs were essentially unremarkable.  Lactic   acid was 1.38.  Her blood gas showed a pH of 7.29.     COURSE IN EMERGENCY DEPARTMENT:   The patient remained stable.  She was given IV fluids.  She was started on   insulin per protocol.  She was discussed with physician on call for   hospitalists and admitted to their service.     ADMITTING DIAGNOSES:  Diabetic ketoacidosis.     The patient was admitted in stable condition.      ___________________  Candace CruiseEmily A Remington Highbaugh MD  Dictated By: Hilaria OtaNelson Santiago, PA-C    My signature above authenticates this document and my orders, the final  diagnosis (es), discharge prescription (s), and instructions in the [facility]   Pulsecheck record.  Nursing notes have been reviewed by the physician/mid-level provider.    If you have any questions please contact [phone].    SB  D:05/03/2014 00:10:00  T: 05/03/2014 07:22:59  [id number]

## 2014-05-03 NOTE — Other (Signed)
[facility], voiced no complaints at this time.Insulin drip continued at 2.858ml. Pt oriented to room.

## 2014-05-03 NOTE — Other (Signed)
Spoke with Dr. Wenda LowMatriano patient BP 171/101. No new orders at this time. Recheck BP in 2 hours per Dr. Wenda LowMatriano. Theodora BlowJoy Macaspac, RN on [facility] made aware.

## 2014-05-03 NOTE — Progress Notes (Signed)
Pt seen and examed. Will check BMP, if anion gap closed, will transition to sc insulin.

## 2014-05-03 NOTE — Other (Signed)
Report Given to Theodora BlowJoy Macaspac, RN [facility].

## 2014-05-03 NOTE — H&P (Signed)
[facility]  History and Physical  NAME:  [name] SprinklesMOORE, [name]  SEX:   F  ADMIT: 05/02/2014  DOB:14-Feb-1985  MR#    [id number]  ROOM:  [id number]  ACCT#  [id number]    I hereby certify this patient for admission based upon medical necessity as   noted below:    <    DATE OF SERVICE:  05/03/2014    CHIEF COMPLAINT:  Nausea, vomiting, vague abdominal pain.    HISTORY OF PRESENT ILLNESS:  This is a 29 year old female with past medical history of diabetes mellitus   type 1, uncontrolled, with frequent diabetic ketoacidosis, diabetic   gastroparesis, who presents to the emergency room with generalized fatigue and   malaise, nausea, vomiting, vague abdominal discomfort.  Also experienced 2   syncopal episodes at home, did not hit her head.  The patient unsure of how   long she was passed out for and attributes it to pain and generalized weakness   and dehydration, initial workup negative in the emergency room.  That   patient, however, is acidotic with a pH of 7.28.  Her bicarb, however, is   greater than 15.  Glucose above 250.  Started on DKA protocol in the emergency   room.  Being admitted to the critical care unit for further management.  The   patient otherwise denies any sick contacts, denies any diarrhea.  Denies any   fevers.  Denies any cough, denies any dysuria, increased frequency.  No source   of infection identified.  Denies any chest pain.    REVIEW OF SYSTEMS:  A 12-point review of systems negative except what is noted in HPI.    PAST MEDICAL HISTORY:  Diabetes mellitus type 1, diabetic ketoacidosis, frequent, also diabetic   gastroparesis.    ALLERGIES:  ZITHROMAX AND PENICILLIN.    HOME MEDICATIONS:  Include citalopram, lisinopril, metoclopramide, insulin glulisine, insulin   glargine.    PHYSICAL EXAMINATION:  VITAL SIGNS:  Blood pressure 110/56, pulse 109, respiratory rate 13,   temperature is 98.4, saturating 100% on room air.  GENERAL APPEARANCE:  No acute distress, resting comfortably in bed.   Appears   stated age, lethargic.  HEENT:  Normocephalic, atraumatic.  Extraocular muscles intact.  Pupils equal,   round, reactive to light and accommodation.  Very dry mucous membranes.  NECK:  Supple, no JVD, no lymphadenopathy, no thyromegaly.  LUNGS:  Clear to auscultation bilaterally, good air entry, no wheezing.  CARDIOVASCULAR:  S1 and S2 within normal limits.  Tachycardic.  No murmurs,   rubs or gallops.  ABDOMEN:  Positive bowel sounds, soft, nontender, nondistended.  No   hepatosplenomegaly.  No guarding or rebound tenderness.  EXTREMITIES:  No clubbing, cyanosis or edema.  Dorsalis pedis pulses 2+   bilaterally.  SKIN:  No rashes, no wounds.  NEUROLOGIC:  Cranial nerves II through XII grossly intact, nonfocal, alert and   oriented times 3.    LABORATORY DATA:  White blood cell count 11.7.  Glucose initially 291.  Urinalysis:  No evidence   of UTI; however, has 80 of ketones and increased specific gravity 1.025.    Bicarbonate initially 17.  Lipase normal at 60.  PH 7.28, pCO2 of 42.3, pO2 is   47.  This is a VBG.  Bicarbonate 20.1    Chest x-ray shows no focal infiltrate, no pneumothorax.    ASSESSMENT AND PLAN:  1.  Diabetic ketoacidosis, continue diabetic ketoacidosis protocol initiated   in the emergency room, ICU  replacement.  Continue IV fluids.  2.  Nausea, vomiting with Zofran, Phenergan p.r.n.  Continue hydration.    Possibly also due to diabetic gastroparesis.  Lipase normal range.  3.  Syncope, negative initial workup, likely related to diabetic ketoacidosis   and severe dehydration.  We will check 2-D echocardiogram, telemonitoring, EKG   pending, cardiac enzymes, magnesium added, TSH, orthostatic vital signs.    Denies any chest pain.    4.  Diabetes mellitus type 1, uncontrolled, multiple admissions for DKA,   endocrinology followup on an outpatient basis, treatment as outlined above.  5.  Hypertension.  Continue lisinopril.  6.  Diet is diabetic, 1800 kilocalorie diet-cardiac.    7.  Deep venous thrombosis prophylaxis with sequential compression devices.    The patient is otherwise ambulatory.    DISPOSITION:  Home in 2 to 4 days depending on response to treatment; however, remains   critically ill at this time.  Will be placed in the critical care unit.    CODE STATUS:  FULL CODE      ___________________  Irving Burton MD  Dictated By: .   RA  D:05/03/2014 03:44:59  T: 05/03/2014 04:46:31  [id number]

## 2014-05-04 LAB — METABOLIC PANEL, BASIC
BUN: 9 mg/dl (ref 7–25)
BUN: 12 mg/dl (ref 7–25)
CO2: 24 mEq/L (ref 21–32)
CO2: 20 mEq/L — ABNORMAL LOW (ref 21–32)
Calcium: 8.3 mg/dl — ABNORMAL LOW (ref 8.5–10.1)
Calcium: 8.8 mg/dl (ref 8.5–10.1)
Chloride: 104 mEq/L (ref 98–107)
Chloride: 103 mEq/L (ref 98–107)
Creatinine: 0.9 mg/dl (ref 0.6–1.3)
Creatinine: 0.9 mg/dl (ref 0.6–1.3)
GFR est AA: >60
GFR est AA: >60
GFR est non-AA: >60
GFR est non-AA: >60
Glucose: 228 mg/dl — ABNORMAL HIGH (ref 74–106)
Glucose: 302 mg/dl — ABNORMAL HIGH (ref 74–106)
Potassium: 3.6 mEq/L (ref 3.5–5.1)
Potassium: 3.9 mEq/L (ref 3.5–5.1)
Sodium: 136 mEq/L (ref 136–145)
Sodium: 134 mEq/L — ABNORMAL LOW (ref 136–145)

## 2014-05-04 LAB — CBC WITH AUTOMATED DIFF
BASOPHILS: 0.4 % (ref 0–3)
EOSINOPHILS: 0 % (ref 0–5)
HCT: 36.3 % — ABNORMAL LOW (ref 37.0–50.0)
HGB: 12.2 gm/dl — ABNORMAL LOW (ref 13.0–17.2)
IMMATURE GRANULOCYTES: 0.3 % (ref 0.0–3.0)
LYMPHOCYTES: 14 % — ABNORMAL LOW (ref 28–48)
MCH: 29.5 pg (ref 25.4–34.6)
MCHC: 33.6 gm/dl (ref 30.0–36.0)
MCV: 87.9 fL (ref 80.0–98.0)
MONOCYTES: 5.5 % (ref 1–13)
MPV: 12 fL — ABNORMAL HIGH (ref 6.0–10.0)
NEUTROPHILS: 79.8 % — ABNORMAL HIGH (ref 34–64)
NRBC: 0 (ref 0–0)
PLATELET: 236 10*3/uL (ref 140–450)
RBC: 4.13 M/uL (ref 3.60–5.20)
RDW-SD: 40.3 (ref 36.4–46.3)
WBC: 11.9 10*3/uL — ABNORMAL HIGH (ref 4.0–11.0)

## 2014-05-04 LAB — GLUCOSE, POC
Glucose (POC): 188 mg/dL — ABNORMAL HIGH (ref 65–105)
Glucose (POC): 294 mg/dL — ABNORMAL HIGH (ref 65–105)
Glucose (POC): 239 mg/dL — ABNORMAL HIGH (ref 65–105)
Glucose (POC): 382 mg/dL — ABNORMAL HIGH (ref 65–105)

## 2014-05-04 MED ORDER — INSULIN GLARGINE 100 UNIT/ML INJECTION
100 unit/mL | Freq: Every evening | SUBCUTANEOUS | Status: DC
Start: 2014-05-04 — End: 2014-05-04

## 2014-05-04 MED ORDER — SODIUM CHLORIDE 0.9 % IJ SYRG
INTRAMUSCULAR | Status: AC
Start: 2014-05-04 — End: 2014-05-03
  Administered 2014-05-04: 01:00:00

## 2014-05-04 MED ORDER — INSULIN GLARGINE 100 UNIT/ML (3 ML) SUB-Q PEN
100 unit/mL (3 mL) | Freq: Every evening | SUBCUTANEOUS | Status: DC
Start: 2014-05-04 — End: 2015-01-20

## 2014-05-04 MED FILL — MORPHINE 2 MG/ML INJECTION: 2 mg/mL | INTRAMUSCULAR | Qty: 1

## 2014-05-04 MED FILL — PROMETHAZINE IN NS 12.5 MG/50 ML IV PIGGY BAG: 12.5 mg/50 ml | INTRAVENOUS | Qty: 50

## 2014-05-04 MED FILL — ONDANSETRON (PF) 4 MG/2 ML INJECTION: 4 mg/2 mL | INTRAMUSCULAR | Qty: 2

## 2014-05-04 MED FILL — INSULIN LISPRO 100 UNIT/ML INJECTION: 100 unit/mL | SUBCUTANEOUS | Qty: 2

## 2014-05-04 MED FILL — BD POSIFLUSH NORMAL SALINE 0.9 % INJECTION SYRINGE: INTRAMUSCULAR | Qty: 10

## 2014-05-04 MED FILL — LISINOPRIL 20 MG TAB: 20 mg | ORAL | Qty: 1

## 2014-05-04 NOTE — Progress Notes (Signed)
Tentative dc plan: home with no needs at this time    PCP: Juliette Alcide, MD     Specialists:   Dr. Chriss Czar endocrinology  Dialysis Unit:na    Pharmacy: [facility]    ZOX:WRUE    Home Environment: Lives at [address]  [place] [address] [phone]. Lives with mom.  Multistory 2 story. 14  Steps into home.  Responsibilities at home include ADL, light house keeping, dressing and feeding self, etc    Prior to admission open services:none    Home Health Agency-foc  Personal Care Agency-foc    Extended Emergency Contact Information  Primary Emergency Contact: Cellucci,Veronica D  Address: [address]           [place], [place] [address] [place]  Home Phone: [phone]  Relation: Parent     Transportation: family will transport home    Therapy Recommendations:    OT = na    PT = na    SLP = na     RT Home O2 Evaluation = na     Wound Care = na       Case Management Assessment      Preferred Language for Healthcare Related Communication     Preferred Language for Healthcare Related Communication: English   Spiritual/Ethnic/Cultural/Religious Needs that Should be Incorporated Into Your Care          FUNCTIONAL ASSESSMENT                   Decline in Gait/Transfer/Balance: No       Decline in Capacity to Feed/Dress/Bathe: No   Developmental Delay: No   Chewing/Swallowing Problems: No      DYSPHAGIA SCREENING                          Difficulty with Secretions     Difficulty with Secretions: No      Speech Slurred/Thick/Garbled     Speech Slurred/Thick/Garbled: No      ABUSE/NEGLECT SCREENING   Physical Abuse/Neglect: Denies   Sexual Abuse: Denies   Sexual Abuse: Denies   Other Abuse/Issues: Denies          PRIMARY DECISION MAKER   Primary Decision Maker Name: Antonina Deziel     Primary Decision Maker Phone Number: [phone]       Primary Decision Maker Relationship to Patient: Parent                      ADVANCE CARE PLANNING (ACP) DOCUMENTS   Confirm Advance Directive: None               Suicide/Psychosocial Screening   Primary Diagnosis or Primary Complaint of an Emotional Behavior Disorder: No   Patient is Currently Experiencing Depression: No   Suicidal Ideation/Attempts: No   Homicidal Ideation/Attempts: No   Alcohol/Drug Intoxication: No   Hallucinations/Delusions: No   Pending, Active, or Temporary Detention Orders: No   Aggressive/Inappropriate Behavior: No      SAD PERSONS                                                  READMIT RISK TOOL   Support Systems: Parent, Family member(s), Friends \\ neighbors   Relationship with Primary Physician Group: Seen at least one time within  the past 6 months   History of Falls Within Past 3 Months: Yes   Needs Assistance with Wound Care AND/OR Mgnt of O2, Nebulizer: No   Requires Financial, Physical and/or Educational Assistance With Medications: No   History of Mental Illness: No   Living Alone: No      CARE MANAGEMENT INTERVENTIONS   PCP Verified by CM: Yes                   Transition of Care Consult (CM Consult): Discharge Planning       Discharge Durable Medical Equipment: No               Current Support Network: Relative's Home   Confirm Follow Up Transport: Family   Confirm Transport and Arrange: Yes   Plan discussed with Pt/Family/Caregiver: Yes   Freedom of Choice Offered: Yes      DISCHARGE LOCATION   Discharge Placement: Home

## 2014-05-04 NOTE — Progress Notes (Signed)
NUTRITION RECOMMENDATIONS:   1800 calorie diet    NUTRITION INITIAL EVALUATION    NUTRITION ASSESSMENT:       Reason for assessment: RN screen    Admitting diagnosis: Diabetic ketoacidosis (HCC)      PMH:   Past Medical History   Diagnosis Date   ??? Diabetes (HCC)    ??? Gastroparesis    ??? Gastroparesis    ??? Hypertension         Anthropometrics:  Height: Ht Readings from Last 3 Encounters:   05/02/14 5\' 6"  (1.676 m)   04/13/14 5\' 6"  (1.676 m)   04/05/14 5\' 6"  (1.676 m) ??       Weight: Wt Readings from Last 3 Encounters:   05/02/14 53.071 kg (117 lb)   04/13/14 52.164 kg (115 lb)   04/05/14 54.885 kg (121 lb) ??       ?? IBW: 59 kg     ?? % IBW: 90    ?? BMI: Body mass index is 18.89 kg/(m^2).    ?? UBW: 56 kg    ?? Wt change: -3 kg or -5 % in 2 wks   Diet and intake history:  ?? Current diet order: DIET DIABETIC WITH OPTIONS Consistent Carb 1800kcal; Regular    ?? Food allergies: nonoe    ?? Diet/intake history: patient has history of DM and gastroparesis    [ ]  <50% intake x >5 days  [ ]  <50% intake x >1 month  [ ]  <75% intake x >7 days  [ ]  <75% intake x 1 month  [ ]  <75% intake x 3 months    ?? Current appetite/PO intake: no PO yet     ?? Assessment of current MNT: adequate if eaten > 75%    ?? Cultural, religious, and ethnic food preferences identified: none    Physical Assessment:  ?? GI symptoms: none noted    ?? Chewing/swallowing issues: none    ?? Skin integrity: intact    ?? Muscle wasting: none    ?? Fluid accumulation: none    ?? Mental status: WNL     Intake and output:    Intake/Output Summary (Last 24 hours) at 05/04/14 1245  Last data filed at 05/04/14 1224   Gross per 24 hour   Intake 1317.99 ml   Output    600 ml   Net 717.99 ml       Estimated daily nutrition intake needs:  ?? 1598 - 1662 kcals (mifflin X 1.25-1.3)    ?? 64 - 75 g protein (1.2-1.4 gm/kg BW)    ?? 1590 ml fluid (30 ml/kg)     Living situation: family    Current pertinent medications: insulin    Pertinent labs: BS=181-291, HgbA1C=8.9     Does patient meet malnutrition criteria: no    NUTRITION DIAGNOSIS:     1. Unintentional wt loss related to gastroparesis as evidenced by 5% wt lose in 2 weeks.    NUTRITION INTERVENTION:     Recommended diet: 1800 calorie diet    NUTRITION MONITORING AND EVALUATION:     Nutrition level of care: moderate, 2    Nutrition monitoring: PO intake, diet tolerance and compliance, wt, BS, CMP, hydration, medical changes    Nutrition goals: PO intake >75%,  BS 100-140    NUTRITION EDUCATION:     Diet instructed on: patient was educated on DM diet 04/06/2014. Glycemic evaluation-no further education needs    April HoldingCHARLENE C CURTIS, RD  05/04/2014

## 2014-05-04 NOTE — Other (Signed)
Bedside and Verbal shift change report given to KIM Herbie Drapeegina DANIELS-CURRY, LPN   (oncoming nurse) by Jule EconomyJoy RN (offgoing nurse). Report included the following information SBAR, Kardex, MAR and Recent Results.

## 2014-05-04 NOTE — Discharge Summary (Signed)
Discharge Summary   Admit Date: 05/02/2014  Discharge Date:  May 04, 2014        Patient ID:  [name] [name]  29 y.o.  May 25, 1985    Chief Complaint   Patient presents with   ??? Vomiting   ??? Fatigue       Discharge Diagnoses/Hospital Course:  Patient Active Problem List    Diagnosis Date Noted   ??? Diabetic ketoacidosis (HCC) 05/03/2014   ??? DKA (diabetic ketoacidoses) (HCC) 04/05/2014       Problem List Items Addressed This Visit     None      Visit Diagnoses     Diabetic ketoacidosis without coma associated with type 1 diabetes mellitus (HCC)    -  Primary     Relevant Medications     lisinopril (PRINIVIL, ZESTRIL) tablet 20 mg     insulin lispro (HUMALOG) injection     insulin glargine (LANTUS) injection 25 Units (Start on 05/04/2014 10:00 PM)     insulin glargine (LANTUS SOLOSTAR) 100 unit/mL (3 mL) pen           1.?? Diabetic ketoacidosis, sec to noncompliance. Incresae lantus to 25 units. s/p diabetic ketoacidosis protocol.  IV fluids.  2.?? Nausea, vomiting with Zofran, Phenergan p.r.n.?? Continue hydration.?? ??  Possibly also due to diabetic gastroparesis.?? Lipase normal range.  3.?? Syncope, negative initial workup, likely related to diabetic ketoacidosis ??  and severe dehydration.?? 2-D echocardiogram wnl, telemonitoring,cardiac enzymes normal, magnesium, TSH, orthostatic vital signs wnl. Denies any chest pain.?? ??  4.?? Diabetes mellitus type 1, uncontrolled, multiple admissions for DKA, ??  endocrinology followup on an outpatient basis, treatment as outlined above.  5.?? Hypertension.?? Continue lisinopril.  6.?? Diet is diabetic, 1800 kilocalorie diet-cardiac. ??  ??     Current Discharge Medication List      CONTINUE these medications which have CHANGED    Details   insulin glargine (LANTUS SOLOSTAR) 100 unit/mL (3 mL) pen 25 Units by SubCUTAneous route nightly.  Qty: 1 Each, Refills: 0         CONTINUE these medications which have NOT CHANGED    Details    citalopram (CELEXA) 20 mg tablet Take 20 mg by mouth daily.      lisinopril (PRINIVIL, ZESTRIL) 20 mg tablet Take 20 mg by mouth daily.      metoclopramide HCl (REGLAN) 5 mg/5 mL syrup Take 1 tsp by mouth Before breakfast, lunch, and dinner.      insulin glulisine (APIDRA SOLOSTAR) 100 unit/mL pen 20 Units by SubCUTAneous route Before breakfast, lunch, and dinner.               Imaging:  Xr Chest Sngl V    05/03/2014   ADDITIONAL HISTORY: DKA. Diabetic ketoacidosis COMPARISON: 01/31/2014 TECHNIQUE: Single view chest.     05/03/2014   IMPRESSION: Heart size is normal. Lungs are clear and well aerated. No focal consolidation.       Physical Exam on Discharge:  BP 103/61 mmHg   Pulse 104   Temp(Src) 98.4 ??F (36.9 ??C)   Resp 18   Ht  (1.676 m)   Wt 53.071 kg (117 lb)   BMI 18.89 kg/m2   SpO2 100%   Breastfeeding? No  GENERAL: in mild distress.   HEENT: Normocephalic and atraumatic head. Oral mucosa moist. No thrush.   Eyes: Pupils are equal, reactive to light and accommodation. Normal extra ocular movements. No icterus.   NECK: Supple. Trachea midline.   RESPIRATORY:  No use of accessory muscles of respiration. Bilateral BS present, decreased at bases. No rales or rhonchi.   CARDIOVASCULAR: S1 and S2 present, regular. No murmur, rub, or thrill.   ABDOMEN: Soft and nontender with positive bowel sounds. No organomegaly.   EXTREMITIES: No edema. No calf tenderness.   NEUROLOGICAL: Cranial nerves II through XII grossly intact. No focal weakness.   SKIN: No rash. Skin is warm, dry, and intact.       Activity: As tolerated    Diet: Diabetic Diet    Labs:  Labs: Results:       Chemistry Recent Labs      05/04/14   0450  05/03/14   2329  05/03/14   1445   05/03/14   0051   GLU  302*  228*  145*   < >   --    NA  134*  136  137   < >   --    K  3.9  3.6  4.1   < >   --    CL  103  104  108*   < >   --    CO2  20*  24  21   < >   --    BUN  12  9  11    < >   --    CREA  0.9  0.9  0.7   < >   --    CA  8.8  8.3*  8.3*   --    --     AP   --    --    --    --   127*   TP   --    --    --    --   8.5*   ALB   --    --    --    --   3.7    < > = values in this interval not displayed.      CBC w/Diff Recent Labs      05/04/14   0450  05/03/14   0458  05/03/14   0051  05/03/14   0030   WBC  11.9*   --   11.7*  13.9*   RBC  4.13   --   4.36  4.85   HGB  12.2*  10.5*  12.3*  13.7   HCT  36.3*  31*  38.3  43.3   PLT  236   --   250  271   GRANS  79.8*   --   88.4*  89.8*   LYMPH  14.0*   --   9.1*  7.6*   EOS  0.0   --   0.0  0.0      Cardiac Enzymes No results for input(s): CPK, CKND1, MYO in the last 72 hours.    Invalid input(s): CKRMB, TROIP   Coagulation No results for input(s): PTP, INR, APTT in the last 72 hours.    Invalid input(s): INREXT    Lipid Panel Lab Results   Component Value Date/Time    CHOLESTEROL, TOTAL 160 12/14/2013 02:52 AM    HDL CHOLESTEROL 44 12/14/2013 02:52 AM    LDL, CALCULATED 105 12/14/2013 02:52 AM    TRIGLYCERIDE 56 12/14/2013 02:52 AM    CHOL/HDL RATIO 3.6 12/14/2013 02:52 AM      BNP No results for input(s): BNPP in the last 72 hours.   Liver Enzymes Recent Labs  05/03/14   0051   TP  8.5*   ALB  3.7   AP  127*   SGOT  20      Thyroid Studies Lab Results   Component Value Date/Time    TSH 0.379 05/03/2014 12:35 AM          Imaging:  Xr Chest Sngl V    05/03/2014   ADDITIONAL HISTORY: DKA. Diabetic ketoacidosis COMPARISON: 01/31/2014 TECHNIQUE: Single view chest.     05/03/2014   IMPRESSION: Heart size is normal. Lungs are clear and well aerated. No focal consolidation.       Treatment Team: Treatment Team: Attending Provider: Faythe Ghee, DO; Consulting Provider: Irving Burton, MD; Hospitalist: Faythe Ghee, DO; Care Manager: Yetta Numbers, RN    Condition at discharge: Stable.     Disposition:  Home       PCP:  Juliette Alcide, MD    Dr. Juliette Alcide, MD, thank you for allowing [place] to participate in the care of this patient.  Please contact me through the hospital if questions arise.    Total time for DC was 46 minutes.       Faythe Ghee, D.O.  [facility]

## 2014-05-04 NOTE — Progress Notes (Signed)
DISCHARGE SUMMARY from Nurse    The following personal items are in your possession at time of discharge:    Dental Appliances: None  Visual Aid: None        Jewelry: Body Piercing (white stud nose piercing)  Clothing:  ( pt belongings recd=1 jacket,1pr sneaker,1shirt)            PATIENT INSTRUCTIONS:    Notify you Physician if you experience:  ?? Increased Shortness of Breath  ?? Dizziness  ?? Fainting  ?? Black Stools  ?? Vomiting  ?? Coughing White, Frothy or Bloody Sputum  ?? Dry cough that will not go away  ?? Increased swelling of arms, legs, ankles or abdomen  ?? Develop a rash  ?? Difficulty breathing while laying down  ?? Urine problems: pain, burning, urgency or difficulty  ?? Temperature greater than 100 degrees F, shaking, chills for more than 24 hours  ?? Increased skin bruising  ?? Sore Mouth, throat or gums  ?? Increased cough, fatigue  ?? Clicking/popping sound in a joint  ?? Increased limb shortening or turning outward        Call you Doctor right away if you have new symptoms such as:  ?? Cough that is worse at night and when you are lying down  ?? Swelling in your legs, ankles, feet, abdomen and/or veins in the neck      Lifestyle Tips:  Appointment after discharge and follow up phone call:   After being discharged, if your appointment does not work for you, please feel free to contact the physician's office to change the appointment.   We want to make sure you are doing well after you have left the hospital.  You will receive a phone call on behalf of [facility] to follow up with you within 24-72 hours of your discharge from the hospital.  This phone call will help us provide you with the best possible care.  Please take the time to answer all questions you are asked so we can help you stay safe in your home before your next doctor's appointment.    Medications:   Take your medicines exactly as instructed.   Ask your doctor or nurse if you have questions about your medicines.    Tell your doctor right away if you have problems with your medicines.  Activity:   Ask your doctor how active you should be.  Remember to take rest breaks.   Do not cross your legs when sitting.   Elevate your legs when you can.  Diet:   Follow any special diet orders from your doctor.   Ask your doctor how much salt (sodium) you should have each day.   Ask if you need to limit the amount or types of fluids you drink each day.  Weight Monitoring:   If you are overweight, ask about a weight loss plan that is right for you.   Weight gain may mean that fluids are building up in your body.   Weigh yourself every day at about the same time and write it down.  Do Not Smoke:   If you smoke, quit.  Smoking is bad for your health.   Avoid second hand smoke.   Call the [place] QUITLINE for help at (303)144-27151-534 190 4495 (1-800-Quit - Now)  Other Tips:   Avoid people with the flu or pneumonia   Keep all of your doctor appointments   Carry a list of your medications, allergies and the shots you  have had              These are general instructions for a healthy lifestyle:    No smoking/ No tobacco products/ Avoid exposure to second hand smoke    Surgeon General's Warning:  Quitting smoking now greatly reduces serious risk to your health.    Obesity, smoking, and sedentary lifestyle greatly increases your risk for illness    A healthy diet, regular physical exercise & weight monitoring are important for maintaining a healthy lifestyle    You may be retaining fluid if you have a history of heart failure or if you experience any of the following symptoms:  Weight gain of 3 pounds or more overnight or 5 pounds in a week, increased swelling in our hands or feet or shortness of breath while lying flat in bed.  Please call your doctor as soon as you notice any of these symptoms; do not wait until your next office visit.    Recognize signs and symptoms of STROKE:    F-face looks uneven    A-arms unable to move or move unevenly     S-speech slurred or non-existent    T-time-call 911 as soon as signs and symptoms begin-DO NOT go       Back to bed or wait to see if you get better-TIME IS BRAIN.    Warning Signs of HEART ATTACK     Call 911 if you have these symptoms:  ? Chest discomfort. Most heart attacks involve discomfort in the center of the chest that lasts more than a few minutes, or that goes away and comes back. It can feel like uncomfortable pressure, squeezing, fullness, or pain.  ? Discomfort in other areas of the upper body. Symptoms can include pain or discomfort in one or both arms, the back, neck, jaw, or stomach.  ? Shortness of breath with or without chest discomfort.  ? Other signs may include breaking out in a cold sweat, nausea, or lightheadedness.  Don't wait more than five minutes to call 911 ??? MINUTES MATTER! Fast action can save your life. Calling 911 is almost always the fastest way to get lifesaving treatment. Emergency Medical Services staff can begin treatment when they arrive ??? up to an hour sooner than if someone gets to the hospital by car.     Myself and/or my family have received education about my diagnosis throughout my hospital stay.  During my hospital stay, I and/or my family/caregiver were included in planning my care upon discharge.  My needs were taken into consideration and I was included in my discharge planning.  I had an opportunity to ask questions.      The discharge information has been reviewed with the patient.  The patient verbalized understanding.    Discharge Caregiver Notified of Impending Discharge: Yes  Name of Designated Discharge Caregiver: Milca Sytsma (Pt's mother)  Discharge Plan/Follow Up Care Reviewed: Glendon Axe phone (caregiver copy sent home with patient)       Discharge medications reviewed with the patient and appropriate educational materials and side effects teaching were provided.

## 2014-05-04 NOTE — Other (Signed)
----------  DocumentID:   [id number]------------------------------------------------              [facility]                       Patient Education Report         Name: Lamar SprinklesMOORE, Bertina                  Date: 05/04/2014    MRN: [id number]                    Time: 7:18:24 AM         Patient ordered video: Patient Safety: Stay Safe While you are in the   Hospital    from [id number] via phone number: [phone] at 7:18:24 AM

## 2014-05-04 NOTE — Progress Notes (Signed)
2-D Echocardiogram is completed.

## 2014-05-05 ENCOUNTER — Inpatient Hospital Stay
Admit: 2014-05-05 | Discharge: 2014-05-05 | Disposition: A | Discharge status: Home / Self Care | Payer: MEDICAID | Attending: Emergency Medicine

## 2014-05-05 ENCOUNTER — Emergency Department: Admit: 2014-05-05 | Payer: MEDICAID

## 2014-05-05 DIAGNOSIS — R0789 Other chest pain: Secondary | ICD-10-CM

## 2014-05-05 LAB — D-DIMER, QUANTITATIVE: D-Dimer, Quant: 0.27 ug/mL (FEU) (ref 0.01–0.50)

## 2014-05-05 LAB — CBC WITH AUTOMATED DIFF
BAND NEUTROPHILS: 3 % (ref 0–11)
BASOPHILS: 3 % (ref 0–3)
HCT: 41.3 % (ref 37.0–50.0)
HGB: 13.1 gm/dl (ref 13.0–17.2)
LYMPHOCYTES: 12 % — ABNORMAL LOW (ref 28–48)
MCH: 28.6 pg (ref 25.4–34.6)
MCHC: 31.7 gm/dl (ref 30.0–36.0)
MCV: 90.2 fL (ref 80.0–98.0)
MONOCYTES: 4 % (ref 1–13)
MPV: 12.9 fL — ABNORMAL HIGH (ref 6.0–10.0)
NEUTROPHILS: 78 % — ABNORMAL HIGH (ref 34–64)
NRBC: 0 (ref 0–0)
PLATELET COMMENTS: NORMAL
PLATELET: 299 10*3/uL (ref 140–450)
RBC Morphology: NORMAL
RBC: 4.58 M/uL (ref 3.60–5.20)
RDW-SD: 41.6 (ref 36.4–46.3)
WBC: 9.9 10*3/uL (ref 4.0–11.0)

## 2014-05-05 LAB — EKG, 12 LEAD, INITIAL
Atrial Rate: 123 {beats}/min
Calculated P Axis: 80 degrees
Calculated R Axis: 80 degrees
Calculated T Axis: 10 degrees
P-R Interval: 142 ms
Q-T Interval: 308 ms
QRS Duration: 66 ms
QTC Calculation (Bezet): 440 ms
Ventricular Rate: 123 {beats}/min

## 2014-05-05 LAB — METABOLIC PANEL, COMPREHENSIVE
ALT (SGPT): 15 U/L (ref 12–78)
AST (SGOT): 17 U/L (ref 15–37)
Albumin: 3.7 gm/dl (ref 3.4–5.0)
Alk. phosphatase: 113 U/L (ref 45–117)
BUN: 20 mg/dl (ref 7–25)
Bilirubin, total: 1.4 mg/dl — ABNORMAL HIGH (ref 0.2–1.0)
CO2: 17 mEq/L — ABNORMAL LOW (ref 21–32)
Calcium: 9 mg/dl (ref 8.5–10.1)
Chloride: 100 mEq/L (ref 98–107)
Creatinine: 1.1 mg/dl (ref 0.6–1.3)
GFR est AA: >60
GFR est non-AA: >60
Glucose: 260 mg/dl — ABNORMAL HIGH (ref 74–106)
Potassium: 3.6 mEq/L (ref 3.5–5.1)
Protein, total: 8.5 gm/dl — ABNORMAL HIGH (ref 6.4–8.2)
Sodium: 133 mEq/L — ABNORMAL LOW (ref 136–145)

## 2014-05-05 LAB — POC CHEM8
BUN: 25 mg/dl (ref 7–25)
CALCIUM,IONIZED: 4.4 mg/dL (ref 4.40–5.40)
CO2, TOTAL: 17 mmol/L — ABNORMAL LOW (ref 21–32)
Chloride: 102 mEq/L (ref 98–107)
Creatinine: 0.9 mg/dl (ref 0.6–1.3)
Glucose: 275 mg/dL — ABNORMAL HIGH (ref 74–106)
HCT: 43 % (ref 38–45)
HGB: 14.6 gm/dl (ref 13.0–17.2)
Potassium: 4 mEq/L (ref 3.5–4.9)
Sodium: 133 mEq/L — ABNORMAL LOW (ref 136–145)

## 2014-05-05 LAB — POC URINE MACROSCOPIC
Blood: NEGATIVE
Glucose: 500 mg/dl — AB
Ketone: >=160 mg/dl — AB
Leukocyte Esterase: NEGATIVE
Nitrites: NEGATIVE
Protein: 100 mg/dl — AB
Specific gravity: 1.025 (ref 1.005–1.030)
Urobilinogen: 0.2 EU/dl (ref 0.0–1.0)
pH (UA): 6 (ref 5–9)

## 2014-05-05 LAB — POC BLOOD GAS
BASE EXCESS: -2 mmol/L (ref <=3)
BICARBONATE: 21 mmol/L — ABNORMAL LOW (ref 22.0–26.0)
CO2, TOTAL: 22 mmol/L (ref 21–32)
O2 SAT: 71 % (ref 70–75)
PCO2: 28 mm Hg — ABNORMAL LOW (ref 41.0–51.0)
PO2: 34 mm Hg — ABNORMAL LOW (ref 35–40)
pH: 7.483 — ABNORMAL HIGH (ref 7.310–7.410)

## 2014-05-05 LAB — MAGNESIUM: Magnesium: 1.9 mg/dl (ref 1.8–2.4)

## 2014-05-05 LAB — LIPASE: Lipase: 69 U/L — ABNORMAL LOW (ref 73–393)

## 2014-05-05 LAB — GLUCOSE, POC: Glucose (POC): 230 mg/dL — ABNORMAL HIGH (ref 65–105)

## 2014-05-05 LAB — LACTIC ACID: Lactic Acid: 1.2 mmol/L (ref 0.4–2.0)

## 2014-05-05 LAB — D DIMER: D DIMER: 0.27 ug/mL (FEU) (ref 0.01–0.50)

## 2014-05-05 LAB — TROPONIN I: Troponin-I: <0.015 ng/ml (ref 0.00–0.09)

## 2014-05-05 MED ORDER — METOPROLOL TARTRATE 5 MG/5 ML IV SOLN
5 mg/ mL | INTRAVENOUS | Status: AC
Start: 2014-05-05 — End: 2014-05-05
  Administered 2014-05-05: 17:00:00 via INTRAVENOUS

## 2014-05-05 MED ORDER — MORPHINE 4 MG/ML SYRINGE
4 mg/mL | INTRAMUSCULAR | Status: AC
Start: 2014-05-05 — End: 2014-05-05
  Administered 2014-05-05: 16:00:00 via INTRAVENOUS

## 2014-05-05 MED ORDER — ALPRAZOLAM 0.5 MG TAB
0.5 mg | ORAL_TABLET | Freq: Three times a day (TID) | ORAL | Status: DC | PRN
Start: 2014-05-05 — End: 2014-05-27

## 2014-05-05 MED ORDER — MORPHINE 4 MG/ML SYRINGE
4 mg/mL | INTRAMUSCULAR | Status: AC
Start: 2014-05-05 — End: 2014-05-05

## 2014-05-05 MED ORDER — LORAZEPAM 2 MG/ML IJ SOLN
2 mg/mL | Freq: Once | INTRAMUSCULAR | Status: AC
Start: 2014-05-05 — End: 2014-05-05
  Administered 2014-05-05: 17:00:00 via INTRAVENOUS

## 2014-05-05 MED ORDER — SODIUM CHLORIDE 0.9 % IJ SYRG
Freq: Once | INTRAMUSCULAR | Status: AC
Start: 2014-05-05 — End: 2014-05-05
  Administered 2014-05-05: 16:00:00 via INTRAVENOUS

## 2014-05-05 MED ORDER — MORPHINE 4 MG/ML SYRINGE
4 mg/mL | INTRAMUSCULAR | Status: AC
Start: 2014-05-05 — End: 2014-05-05
  Administered 2014-05-05: 17:00:00 via INTRAVENOUS

## 2014-05-05 MED ORDER — FAMOTIDINE (PF) 20 MG/2 ML IV
20 mg/2 mL | INTRAVENOUS | Status: AC
Start: 2014-05-05 — End: 2014-05-05
  Administered 2014-05-05: 16:00:00 via INTRAVENOUS

## 2014-05-05 MED ORDER — SODIUM CHLORIDE 0.9% BOLUS IV
0.9 % | Freq: Once | INTRAVENOUS | Status: AC
Start: 2014-05-05 — End: 2014-05-05
  Administered 2014-05-05: 15:00:00 via INTRAVENOUS

## 2014-05-05 MED ORDER — ONDANSETRON (PF) 4 MG/2 ML INJECTION
4 mg/2 mL | Freq: Once | INTRAMUSCULAR | Status: AC
Start: 2014-05-05 — End: 2014-05-05
  Administered 2014-05-05: 16:00:00 via INTRAVENOUS

## 2014-05-05 MED ORDER — SODIUM CHLORIDE 0.9% BOLUS IV
0.9 % | INTRAVENOUS | Status: DC
Start: 2014-05-05 — End: 2014-05-05

## 2014-05-05 MED FILL — ONDANSETRON (PF) 4 MG/2 ML INJECTION: 4 mg/2 mL | INTRAMUSCULAR | Qty: 2

## 2014-05-05 MED FILL — MORPHINE 4 MG/ML SYRINGE: 4 mg/mL | INTRAMUSCULAR | Qty: 1

## 2014-05-05 MED FILL — METOPROLOL TARTRATE 5 MG/5 ML IV SOLN: 5 mg/ mL | INTRAVENOUS | Qty: 5

## 2014-05-05 MED FILL — SODIUM CHLORIDE 0.9 % INJECTION: INTRAMUSCULAR | Qty: 10

## 2014-05-05 MED FILL — LORAZEPAM 2 MG/ML IJ SOLN: 2 mg/mL | INTRAMUSCULAR | Qty: 1

## 2014-05-05 NOTE — ED Provider Notes (Signed)
[facility]  EMERGENCY [facility]PARTMENT TREATMENT REPORT  NAME:  [place], [place]  SEX:   F  ADMIT: 05/05/2014  DOB:   January 10, 1986  MR#    [id number]  ROOM:  [id number]  TIME DICTATED: 04 04 PM  ACCT#  [id number]        ADDENDUM BY JULIA C. HUBBARD, PA     DIAGNOSTIC STUDIES:  Her EKG shows sinus tachycardia with a ventricular of 123.  No acute change to   suggest ischemia per the attending.      ___________________  Liberty HandyJennifer H Himmel-Zayed Griffie DO  Dictated By: Maurice SmallJulia C. Williams CheHubbard, GeorgiaPA    My signature above authenticates this document and my orders, the final  diagnosis (es), discharge prescription (s), and instructions in the [facility]   Pulsecheck record.  Nursing notes have been reviewed by the physician/mid-level provider.    If you have any questions please contact [phone].    AC  D:05/05/2014 16:04:43  T: 05/05/2014 16:30:54  [id number]

## 2014-05-05 NOTE — Progress Notes (Signed)
Code R - 1st admit DKA.  Presented today with chest pain.  Treated and discharged.

## 2014-05-05 NOTE — ED Notes (Signed)
Pt states she has been having chest pain with palpitations for 24 hours, released from hospital yesterday after 2 day stay for DKA, states BS was 146 this AM

## 2014-05-05 NOTE — ED Notes (Signed)
3:26 PM  05/05/2014     Discharge instructions given to patient (name) with verbalization of understanding. Patient accompanied by patient.  Patient discharged with the following prescriptions   Current Discharge Medication List      START taking these medications    Details   ALPRAZolam (XANAX) 0.5 mg tablet Take 1 Tab by mouth every eight (8) hours as needed for Anxiety. Max Daily Amount: 1.5 mg.  Qty: 20 Tab, Refills: 0         CONTINUE these medications which have NOT CHANGED    Details   insulin glargine (LANTUS SOLOSTAR) 100 unit/mL (3 mL) pen 25 Units by SubCUTAneous route nightly.  Qty: 1 Each, Refills: 0      citalopram (CELEXA) 20 mg tablet Take 20 mg by mouth daily.      lisinopril (PRINIVIL, ZESTRIL) 20 mg tablet Take 20 mg by mouth daily.      metoclopramide HCl (REGLAN) 5 mg/5 mL syrup Take 1 tsp by mouth Before breakfast, lunch, and dinner.      insulin glulisine (APIDRA SOLOSTAR) 100 unit/mL pen 20 Units by SubCUTAneous route Before breakfast, lunch, and dinner.          . Patient discharged to Home (destination).      Dewitt HoesKENNETH R EMILIO, RN

## 2014-05-05 NOTE — ED Provider Notes (Signed)
[facility]  EMERGENCY DEPARTMENT TREATMENT REPORT  NAME:  [name] [name]  SEX:   F  ADMIT: 05/05/2014  DOB:   1985/03/27  MR#    [id number]  ROOM:  [id number]  TIME DICTATED: 03 20 PM  ACCT#  [id number]        TIME OF EVALUATION:   1006    CHIEF COMPLAINT:  Chest pain.    HISTORY OF PRESENT ILLNESS:  This is a 29 year old female who presents complaining of chest pain.  She kind   of points to the center of her chest and states it has been there since she   was in the hospital 3 days ago.  It has been constant.  It does not radiate to   her neck, jaw, back or arm, is not associated with any shortness of breath.    It is not pleuritic.  She has had no cough.  Denies any leg pain or swelling.    She is not aware of any fever or chills.  She tells me that her sugar this   morning was 114.    REVIEW OF SYSTEMS:   CONSTITUTIONAL:  No fever, chills or weight loss.  EYES:  No visual symptoms.  ENT:  No sore throat, runny nose or other URI symptoms.  RESPIRATORY:  No cough, shortness of breath or wheezing.  CARDIOVASCULAR:  Positive for chest pain which she describes as kind of sharp,   achy feeling in the center of her chest.  It seems to be worse with movement,   is not made worse with breathing.  It is not pleuritic.  She says sometimes   she feels like her heart is having some palpitations.  She denies any   orthopnea.  GASTROINTESTINAL:  She had some nausea and vomiting when she woke up this   morning.  No abdominal pain.  MUSCULOSKELETAL:  Denies any leg pain or swelling.  GENITOURINARY:  No urinary symptoms.  INTEGUMENTARY:  No rash.  NEUROLOGICAL:  No headaches, sensory or motor symptoms.  Denies complaints in all other systems.    PAST MEDICAL HISTORY:  She has a history of diabetes mellitus type 1, insulin requiring, was just in   the hospital and discharged on 05/04/2014.  She was admitted for DKA.  She has   a history of gastroparesis.  History of syncope, had a workup at that time    while she was here including a 2-D echo which was normal limits.  She had   telemetry monitoring and cardiac enzymes were all normal.  She has a history   of hypertension.    MEDICATIONS:  List was reviewed.    ALLERGIES:  PENICILLIN, ZITHROMAX.    SOCIAL HISTORY:  Negative for tobacco or alcohol.  No street drugs.  She has not traveled   anywhere recently.    FAMILY HISTORY:  No history of premature coronary disease.  No history of blood clots.      MEDICATIONS:   Depo-Provera.       PHYSICAL EXAMINATION:  GENERAL:  This is a well developed female.  VITAL SIGNS:  Blood pressure was 160/106, pulse 124, respirations 18,   temperature was 98.3, O2 sats 100% on room air.  HEENT:  Conjunctivae clear.  Sclerae anicteric.  Ears:  TMs clear bilaterally.    Mouth:  Mucous membranes pink, throat is clear.  NECK:  Supple, nontender, symmetrical, no masses or JVD, trachea midline,   thyroid not enlarged, nodular or tender.  LUNGS:  Clear to auscultation with symmetrical expansion.  HEART:  Regular rate and rhythm.  CHEST:  She has reproducible tenderness over the anterior chest wall.  No   acute bony or soft tissue abnormalities on inspection or palpation.  ABDOMEN:  Completely soft, nontender, no organomegaly, no masses.  BACK:  No flank tenderness.  EXTREMITIES:  Warm, dry, well perfused.  Calves soft, nontender.  SKIN:  Without rash.  NEUROLOGIC:  She is awake, she is alert, she is oriented, answering questions   appropriately.    CONTINUED BY Erminio Nygard HIMMEL-Yuko Coventry, DO:    INITIAL ASSESSMENT AND MANAGEMENT PLAN:  The patient is a 29 year old female who presents here with chest pain.  She   states it is stabbing and intermittent.  The patient recently in the hospital   here for DKA.  She presents here tachycardic and hypertensive despite taking   her lisinopril as instructed.  The patient was also noted to be on Celexa.    The patient will get some basic laboratory studies.  We will try and make sure    that she is not back in her DKA again today.  The patient's chest pain is   easily characterized by her behavior in the room.  The patient intermittently   will contract her chest down and say that it has become painful.  The patient   otherwise looks well.    DIAGNOSTIC STUDIES:  The patient's CBC is normal.  The patient's urinalysis shows protein, glucose   and ketones with a small amount of bilirubin her D-dimer is negative.    Chemistry shows a sodium of 133 and a CO2 of 17  with a sugar of 260. The   patient's lipase low at 69. Total bilirubin and total protein are also   slightly elevated.    COURSE IN THE EMERGENCY DEPARTMENT:  The patient was given a liter of fluid and without insulin, came from 260 down   to 230 in less than an hour. The patient's chest x-ray was read as negative   by radiology.  Lungs are clear.  Heart is of normal size.  The patient's   previous visit was looked up and she appeared to have negative syncope workup   for cardiac disease.  She had a 2D echo which showed an ejection fraction of   55% and good ventricular function.  Normal left ventricular size.  There was   no hemodynamically significant valvular flow abnormalities and she had only   grade 1 diastolic dysfunction.  This was read by Dr. Zeb ComfortGambill.  She eventually   was dispositioned home with the assumption that the syncope was due to severe   hydration due to her DKA.  The patient has hypertension and she was to   continue the Cipro; however, her blood pressure was never as high in the   hospital on her previous visit as she is  today.  The patient presented here   with blood pressure 161/111 and 171/102.  The patient's heart rate came down   at least 10 beats a minute with her liter of fluids that we infused.  She was   then given beta blockade in the form of 5 mg of metoprolol times 3, doses q.5   minutes, her blood pressure came to 170/95.  The patient complaining of chest    pain again.  She was given another 4 of morphine and she was given 1 mg of   Ativan and shortly thereafter her blood pressure  immediately dropped to   124/82. Her heart rate dropped to 84 and she has been there in range ever   since.  The patient and I had a discussion about her pain and how much better   she feels right now and the patient stated that the last medicine seemed to   help the most.  The patient and I had a discussion about what it is that could   be bothering her, making her so anxious and she stated \\"I don't have   anything going on right now.\\"  I have no explanation for why it is that the   Ativan helped her more than any other medication here except that she is   anxious at and baseline.  The patient is amenable to Xanax  for her at home.   She will when she experiences this symptoms take the medicine and give it   about 20 minutes for it to work.  If the patient is still feeling symptomatic   or too nervous to take the medicine is always welcome to return.  She will be   following up with her primary care physician, Dr. Dion Body.  The patient   understands her instructions as given to her and agrees to follow up.  I let   her know that I could not refill these medicines for her and so she needed to   go to Dr. Dion Body early next week and let him know whether or not this medication   was helpful for her.  Since she has been here very frequently in the last   month or so still I suggested that her follow up with deep breaths.    CLINICAL IMPRESSION AND DIAGNOSES:  1. Atypical chest pain.   2.  Anxiety.    DISPOSITION AND PLAN:  The patient discharged home for followup with Dr. Dion Body. She will be trying   Xanax this weekend to see if this is helpful for her.  She is encouraged to   return for any new or worsening symptoms of concern.      ___________________  Liberty Handy Himmel-Kellee Sittner DO  Dictated By: Maurice Small. Williams Che, [place]    My signature above authenticates this document and my orders, the final   diagnosis (es), discharge prescription (s), and instructions in the [facility]   [facility] record.  Nursing notes have been reviewed by the physician/mid-level provider.    If you have any questions please contact [phone].    [place]  D:05/05/2014 15:20:15  T: 05/05/2014 15:58:28  [id number]

## 2014-05-05 NOTE — ED Notes (Signed)
Pt resting with eyes closed at this time

## 2014-05-07 ENCOUNTER — Inpatient Hospital Stay
Admit: 2014-05-07 | Discharge: 2014-05-07 | Disposition: A | Discharge status: Home / Self Care | Payer: MEDICAID | Attending: Emergency Medicine

## 2014-05-07 DIAGNOSIS — R0789 Other chest pain: Secondary | ICD-10-CM

## 2014-05-07 LAB — POC CHEM8
BUN: 12 mg/dl (ref 7–25)
BUN: 9 mg/dl (ref 7–25)
CALCIUM,IONIZED: 4.1 mg/dL — ABNORMAL LOW (ref 4.40–5.40)
CALCIUM,IONIZED: 4.6 mg/dL (ref 4.40–5.40)
CO2, TOTAL: 17 mmol/L — ABNORMAL LOW (ref 21–32)
CO2, TOTAL: 19 mmol/L — ABNORMAL LOW (ref 21–32)
Chloride: 100 mEq/L (ref 98–107)
Chloride: 102 mEq/L (ref 98–107)
Creatinine: 0.7 mg/dl (ref 0.6–1.3)
Creatinine: 0.8 mg/dl (ref 0.6–1.3)
Glucose: 300 mg/dL — ABNORMAL HIGH (ref 74–106)
Glucose: 310 mg/dL — ABNORMAL HIGH (ref 74–106)
HCT: 33 % — ABNORMAL LOW (ref 38–45)
HCT: 36 % — ABNORMAL LOW (ref 38–45)
HGB: 11.2 gm/dl — ABNORMAL LOW (ref 13.0–17.2)
HGB: 12.2 gm/dl — ABNORMAL LOW (ref 13.0–17.2)
Potassium: 3.1 mEq/L — ABNORMAL LOW (ref 3.5–4.9)
Potassium: 6.2 mEq/L — CR (ref 3.5–4.9)
Sodium: 131 mEq/L — ABNORMAL LOW (ref 136–145)
Sodium: 134 mEq/L — ABNORMAL LOW (ref 136–145)

## 2014-05-07 LAB — METABOLIC PANEL, BASIC
BUN: 10 mg/dl (ref 7–25)
CO2: 20 mEq/L — ABNORMAL LOW (ref 21–32)
Calcium: 8.3 mg/dl — ABNORMAL LOW (ref 8.5–10.1)
Chloride: 101 mEq/L (ref 98–107)
Creatinine: 0.9 mg/dl (ref 0.6–1.3)
GFR est AA: >60
GFR est non-AA: >60
Glucose: 285 mg/dl — ABNORMAL HIGH (ref 74–106)
Potassium: 3.1 mEq/L — ABNORMAL LOW (ref 3.5–5.1)
Sodium: 134 mEq/L — ABNORMAL LOW (ref 136–145)

## 2014-05-07 LAB — POC TROPONIN: Troponin-I: 0 ng/ml (ref 0.00–0.07)

## 2014-05-07 LAB — GLUCOSE, POC: Glucose (POC): 203 mg/dL — ABNORMAL HIGH (ref 65–105)

## 2014-05-07 MED ORDER — SODIUM CHLORIDE 0.9% BOLUS IV
0.9 % | INTRAVENOUS | Status: AC
Start: 2014-05-07 — End: 2014-05-07
  Administered 2014-05-07: 10:00:00 via INTRAVENOUS

## 2014-05-07 MED ORDER — INSULIN REGULAR HUMAN 100 UNIT/ML INJECTION
100 unit/mL | INTRAMUSCULAR | Status: AC
Start: 2014-05-07 — End: 2014-05-07
  Administered 2014-05-07: 11:00:00 via SUBCUTANEOUS

## 2014-05-07 MED ORDER — LORAZEPAM 2 MG/ML IJ SOLN
2 mg/mL | Freq: Once | INTRAMUSCULAR | Status: AC
Start: 2014-05-07 — End: 2014-05-07
  Administered 2014-05-07: 10:00:00 via INTRAVENOUS

## 2014-05-07 MED ORDER — SODIUM CHLORIDE 0.9% BOLUS IV
0.9 % | INTRAVENOUS | Status: AC
Start: 2014-05-07 — End: 2014-05-07
  Administered 2014-05-07: 12:00:00 via INTRAVENOUS

## 2014-05-07 MED FILL — LORAZEPAM 2 MG/ML IJ SOLN: 2 mg/mL | INTRAMUSCULAR | Qty: 1

## 2014-05-07 MED FILL — INSULIN REGULAR HUMAN 100 UNIT/ML INJECTION: 100 unit/mL | INTRAMUSCULAR | Qty: 10

## 2014-05-07 NOTE — ED Notes (Signed)
8:14 AM  05/07/2014     Discharge instructions given to pt (name) with verbalization of understanding. Patient accompanied by mom.  Patient discharged with the following prescriptions none. Patient discharged to home (destination).      Barnetta ChapelOBIN N DAMWEBER, RN

## 2014-05-07 NOTE — ED Notes (Signed)
Chest pain and palpitations

## 2014-05-12 ENCOUNTER — Inpatient Hospital Stay
Admit: 2014-05-12 | Discharge: 2014-05-13 | Disposition: A | Discharge status: Home / Self Care | Payer: MEDICAID | Attending: Emergency Medical Services

## 2014-05-12 DIAGNOSIS — K3184 Gastroparesis: Secondary | ICD-10-CM

## 2014-05-12 LAB — CBC WITH AUTOMATED DIFF
BASOPHILS: 0.8 % (ref 0–3)
EOSINOPHILS: 3 % (ref 0–5)
HCT: 42 % (ref 37.0–50.0)
HGB: 13.5 gm/dl (ref 13.0–17.2)
IMMATURE GRANULOCYTES: 0.4 % (ref 0.0–3.0)
LYMPHOCYTES: 53.7 % — ABNORMAL HIGH (ref 28–48)
MCH: 28.4 pg (ref 25.4–34.6)
MCHC: 32.1 gm/dl (ref 30.0–36.0)
MCV: 88.2 fL (ref 80.0–98.0)
MONOCYTES: 13.7 % — ABNORMAL HIGH (ref 1–13)
MPV: 11.4 fL — ABNORMAL HIGH (ref 6.0–10.0)
NEUTROPHILS: 28.4 % — ABNORMAL LOW (ref 34–64)
NRBC: 0 (ref 0–0)
PLATELET: 365 10*3/uL (ref 140–450)
RBC: 4.76 M/uL (ref 3.60–5.20)
RDW-SD: 41.3 (ref 36.4–46.3)
WBC: 5 10*3/uL (ref 4.0–11.0)

## 2014-05-12 LAB — POC URINE MACROSCOPIC
Bilirubin: NEGATIVE
Glucose: 500 mg/dl — AB
Ketone: NEGATIVE mg/dl
Leukocyte Esterase: NEGATIVE
Nitrites: NEGATIVE
Protein: 30 mg/dl — AB
Specific gravity: 1.02 (ref 1.005–1.030)
Urobilinogen: 0.2 EU/dl (ref 0.0–1.0)
pH (UA): 7 (ref 5–9)

## 2014-05-12 LAB — METABOLIC PANEL, COMPREHENSIVE
ALT (SGPT): 15 U/L (ref 12–78)
AST (SGOT): 16 U/L (ref 15–37)
Albumin: 4 gm/dl (ref 3.4–5.0)
Alk. phosphatase: 98 U/L (ref 45–117)
BUN: 6 mg/dl — ABNORMAL LOW (ref 7–25)
Bilirubin, total: 0.7 mg/dl (ref 0.2–1.0)
CO2: 28 mEq/L (ref 21–32)
Calcium: 9.2 mg/dl (ref 8.5–10.1)
Chloride: 98 mEq/L (ref 98–107)
Creatinine: 0.8 mg/dl (ref 0.6–1.3)
GFR est AA: >60
GFR est non-AA: >60
Glucose: 117 mg/dl — ABNORMAL HIGH (ref 74–106)
Potassium: 3.4 mEq/L — ABNORMAL LOW (ref 3.5–5.1)
Protein, total: 8.6 gm/dl — ABNORMAL HIGH (ref 6.4–8.2)
Sodium: 135 mEq/L — ABNORMAL LOW (ref 136–145)

## 2014-05-12 LAB — POC HCG,URINE: HCG urine, QL: NEGATIVE

## 2014-05-12 LAB — LIPASE: Lipase: 83 U/L (ref 73–393)

## 2014-05-12 MED ORDER — SODIUM CHLORIDE 0.9 % IJ SYRG
Freq: Once | INTRAMUSCULAR | Status: AC
Start: 2014-05-12 — End: 2014-05-12
  Administered 2014-05-12: 22:00:00 via INTRAVENOUS

## 2014-05-12 MED ORDER — DIPHENHYDRAMINE HCL 50 MG/ML IJ SOLN
50 mg/mL | INTRAMUSCULAR | Status: AC
Start: 2014-05-12 — End: 2014-05-12
  Administered 2014-05-12: 22:00:00 via INTRAVENOUS

## 2014-05-12 MED ORDER — METOCLOPRAMIDE 5 MG/ML IJ SOLN
5 mg/mL | Freq: Once | INTRAMUSCULAR | Status: AC
Start: 2014-05-12 — End: 2014-05-12
  Administered 2014-05-12: 22:00:00 via INTRAVENOUS

## 2014-05-12 MED ORDER — METOCLOPRAMIDE 5 MG/ML IJ SOLN
5 mg/mL | INTRAMUSCULAR | Status: DC
Start: 2014-05-12 — End: 2014-05-12

## 2014-05-12 MED ORDER — SODIUM CHLORIDE 0.9% BOLUS IV
0.9 % | INTRAVENOUS | Status: AC
Start: 2014-05-12 — End: 2014-05-12
  Administered 2014-05-12: 22:00:00 via INTRAVENOUS

## 2014-05-12 MED ORDER — MORPHINE 4 MG/ML SYRINGE
4 mg/mL | INTRAMUSCULAR | Status: DC
Start: 2014-05-12 — End: 2014-05-12

## 2014-05-12 MED FILL — BD POSIFLUSH NORMAL SALINE 0.9 % INJECTION SYRINGE: INTRAMUSCULAR | Qty: 10

## 2014-05-12 MED FILL — DIPHENHYDRAMINE HCL 50 MG/ML IJ SOLN: 50 mg/mL | INTRAMUSCULAR | Qty: 1

## 2014-05-12 MED FILL — METOCLOPRAMIDE 5 MG/ML IJ SOLN: 5 mg/mL | INTRAMUSCULAR | Qty: 2

## 2014-05-12 NOTE — ED Provider Notes (Signed)
[facility]  EMERGENCY DEPARTMENT TREATMENT REPORT  NAME:  [name] [name]  SEX:   F  ADMIT: 05/12/2014  DOB:   04-30-1985  MR#    [id number]  ROOM:  [id number]  TIME DICTATED: 08 27 PM  ACCT#  [id number]    cc: Jerral Bonito MD    1PRIMARY CARE PHYSICIAN:  Jerral Bonito, MD    CHIEF COMPLAINT:  Abdominal pain, nausea, vomiting.    HISTORY OF PRESENT ILLNESS:  This is a 29 year old with a history of type 1 diabetes and gastroparesis who   has had generalized abdominal pain and cramping but mainly left upper quadrant   since yesterday.  She states she ate steamed crabs and she has been \\"messed   up ever since then.\\"  She has had nausea and multiple episodes of vomiting.    Denies having any diarrhea.  She feels that this is also a flare of her   gastroparesis.  She does not feel like she is in DKA.  She checked her glucose   prior to arrival and it was 109.    REVIEW OF SYSTEMS:  CONSTITUTIONAL:  No fever or chills.  EYES:  No visual symptoms.   ENT:  No sore throat, runny nose, or other URI symptoms.   RESPIRATORY:  No cough, shortness of breath, or wheezing.   CARDIOVASCULAR:  No chest pain, chest pressure, or palpitations.   GASTROINTESTINAL:  Abdominal pain, nausea, vomiting, no diarrhea.  GENITOURINARY:  No dysuria, frequency, or urgency.   MUSCULOSKELETAL:  No joint pain or swelling.   INTEGUMENTARY:  No rashes.     PAST MEDICAL HISTORY:  Diabetes type 1, gastroparesis, hypertension, C-section.    SOCIAL HISTORY:  No tobacco.    FAMILY HISTORY:  None.     ALLERGIES:  MULTIPLE AND REVIEWED.      MEDICATIONS:  Multiple and reviewed.      PHYSICAL EXAMINATION:  VITAL SIGNS:  Blood pressure 190/105, pulse 110, respirations 20, oxygen 100%   on room air.  GENERAL APPEARANCE:  The patient appears uncomfortable, otherwise well   developed and well nourished.  HEENT:  Eyes:  Conjunctivae clear, lids normal.  Pupils equal, symmetrical,   and normally reactive.   Mouth/Throat:  Surfaces of the pharynx, palate, and    tongue are pink, moist, and without lesions.    NECK:  Supple.  RESPIRATORY:  Clear and equal breath sounds.  No respiratory distress,   tachypnea, or accessory muscle use.    CARDIOVASCULAR:  Heart regular without murmurs, gallops, rubs, or thrills.   CHEST:  Chest symmetrical without masses or tenderness.   GASTROINTESTINAL:  Abdomen soft, nondistended, nontender.  Active bowel   sounds.  MUSCULOSKELETAL:  Stance and gait appear normal.    EXTREMITIES:  Normal examination without edema or deformity.    SKIN:  Warm and dry without rashes.      INITIAL ASSESSMENT AND MANAGEMENT PLAN:     This is a 29 year old female with abdominal pain, nausea, and vomiting.  We   will check GI labs, treat symptomatically.    DIAGNOSTIC STUDIES:     CBC, CMP, and lipase are unremarkable.  Urine pregnancy is negative.  Urine   with trace blood, negative nitrites and leukocytes.    COURSE:  The patient has remained stable throughout ED stay.  The patient does feel   better after receiving IV fluids, Benadryl, Reglan, and morphine.  She does   feel comfortable being discharged home.  I have written  her prescriptions for   Zofran and Bentyl.  I have asked her to follow up with her primary care   physician and GI.      FINAL DIAGNOSIS:  Gastroparesis.      DISPOSITION:    The patient is discharged home in stable condition with instructions to follow   up with their regular doctor.  They are advised to return immediately for any   worsening or symptoms of concern.      The patient was personally evaluated by myself and Dr.  Damien Fusiavid Markail Diekman, who   agrees with the above assessment and plan.      ___________________  Elsie Saasavid A Evona Westra MD  Dictated By: Caprice RenshawJenny Lynn E. Mertie MooresWhittington, PA-C    My signature above authenticates this document and my orders, the final  diagnosis (es), discharge prescription (s), and instructions in the [facility]   P[facility] record.  If you have any questions please contact [phone].     Nursing notes have been reviewed by the physician/ advanced practice   clinician.    KB  D:05/12/2014 20:27:09  T: 05/12/2014 21:24:31  [id number]

## 2014-05-12 NOTE — ED Provider Notes (Signed)
[place]  EMERGENCY DEPARTMENT TREATMENT REPORT  NAME:  [name] [name]  SEX:   F  ADMIT: 05/07/2014  DOB:   1985-03-01  MR#    [id number]  ROOM:  [id number]  TIME DICTATED: 07 06 AM  ACCT#  [id number]        CHIEF COMPLAINT:  A 29 year old female here today for evaluation of chest pain, palpitations,   and had this recurrently.  Recently was in the hospital for the same, she said   she having palpitations and chest discomfort this morning with associated   nausea.  She was noted to have diabetic gastroparesis and feels the same as   she has with the gastroparesis before.  She notes that her blood sugars have   been okay.  She says they have been in the 100s recently.    Denies any   fevers, has no cough, congestion, no diarrhea, no urine symptoms.    REVIEW OF SYSTEMS:  CONSTITUTIONAL:  No fever, chills, or weight loss.   EYES:  Fundi: Optic discs are normal in appearance, no gross hemorrhages or   exudates seen.   ENT:  No sore throat, runny nose, or other URI symptoms.   HEMATOLOGIC/LYMPHATIC:  No excessive bruising or lymph node swelling.  CARDIOVASCULAR:  As above.  NEUROLOGICAL:  No headaches, sensory or motor symptoms.  Denies complaints in all other systems.    PAST MEDICAL HISTORY:  Diabetes, diabetic gastroparesis, hypertension.    PAST SURGICAL HISTORY:  Cesarean section.    SOCIAL HISTORY:  The patient denies smoking, drinking or drug use.    FAMILY HISTORY:  Hypertension, diabetes.    ALLERGIES:  PENICILLINS and AZITHROMYCIN.    CURRENT MEDICATIONS:  Xanax, Celexa, Lantus, Apidra, Prinivil, Reglan.    PHYSICAL EXAMINATION:  GENERAL:  The patient is sitting in bed, writhing in bed in a rather   dramatically fashion.  When you are not in the room, she is resting   comfortably.  VITAL SIGNS:  Temperature 98.3, heart rate 107, pressure 150/92, oxygen   saturation is 100% on room air.  HEENT:  Head:  There is no acute trauma.  No JVD.   ENT:  Mucosa moist and intact.  Posterior pharynx nonerythematous.  HEART:  Borderline tachycardic.  No rubs or gallops.  LUNGS:  Clear bilaterally.  ABDOMEN:  Soft, benign.  CHEST WALL:  When you touch her chest wall, she screams out in pain in   dramatic fashion.  No overlying chest wall anomalies to explain this.    INITIAL ASSESSMENT AND MANAGEMENT PLAN:  A 29 year old female here today for evaluation of palpitations and chest pain,   who tends to be more dramatic than is necessary by this woman.  I do not   suspect acute necrotizing infection that would cause this pain.  I do not   suspect zoster infection as she has no overlying skin changes that would cause   such severe pain.  I have met the patient before and my concern is for more   of narcotics since the patient did not want anything else.  As soon as I walk   in the room, she starts begging me for pain medicine and I feel uncomfortable   writing her for this.  She does seem anxious, which may be the etiology of   this.    On review of her recent visits, the patient was recently admitted to the   hospital for DKA and recently discharged, and then was  subsequently seen here   again 2 days ago, had a negative workup, responding to anxiolytics.  She had a   negative 2-D echo and echocardiogram with cardiac markers that were negative   during the prior admission and negative workup 2 days ago.    PLAN:  Repeat 1 set of cardiac markers to make sure there is nothing going coronary   wise.  We will do a screening cardiogram and treat for the anxiety.  Advised   her I am not going to give her any narcotics for her pain.  The patient is not   quite happy with me in regards to this.    RESULTS:     Hemoglobin 12.2.  Hematocrit 36.  Sodium 134.  Potassium 3.1.  Chloride 100.    Blood sugar is 310.  Troponin is undetectable.    COURSE IN DEPARTMENT:    The patient is seen here.  After anxiolytics, heart rate came down and    pressure came down.  The patient is resting comfortably.  She was, given her   hypoglycemia, given insulin and blood sugar rechecked was 203.      Given the reassuring workup, no need for intervention or hospital admission.      FINAL DIAGNOSES:  1.  Chest wall pain.   2.  Palpitations.  3.  Hyperglycemia in a known diabetic.      DISPOSITION:  The patient was discharged home in stable condition.      ___________________  Mora Bellman MD  Dictated By: Marland Kitchen     My signature above authenticates this document and my orders, the final  diagnosis (es), discharge prescription (s), and instructions in the [place] record.  If you have any questions please contact [phone].    Nursing notes have been reviewed by the physician/ advanced practice   clinician.    RKV  D:05/12/2014 37:85:88  T: 05/12/2014 09:08:37  [id number]

## 2014-05-12 NOTE — Progress Notes (Signed)
Code R: pt dc'd 05/04/2014 after inpt for DKA.  Presented to ED with abd pain.  Pt treated and discharged from ED.

## 2014-05-12 NOTE — ED Notes (Signed)
8:48 PM  05/12/2014     Discharge instructions given to patient (name) with verbalization of understanding. Patient accompanied by self.  Patient discharged with the following prescriptions Zofran, Bentyl. Patient discharged to home (destination).      Butch PennyKRISTEN M. GATLING, RN

## 2014-05-13 MED ORDER — DICYCLOMINE 20 MG TAB
20 mg | ORAL_TABLET | Freq: Four times a day (QID) | ORAL | Status: AC
Start: 2014-05-13 — End: 2014-05-15

## 2014-05-13 MED ORDER — ONDANSETRON 4 MG TAB, RAPID DISSOLVE
4 mg | ORAL_TABLET | Freq: Three times a day (TID) | ORAL | Status: DC | PRN
Start: 2014-05-13 — End: 2014-05-27

## 2014-05-25 ENCOUNTER — Inpatient Hospital Stay
Admit: 2014-05-25 | Discharge: 2014-05-26 | Disposition: A | Discharge status: Home / Self Care | Payer: MEDICAID | Attending: Emergency Medicine

## 2014-05-25 DIAGNOSIS — G8929 Other chronic pain: Secondary | ICD-10-CM

## 2014-05-25 LAB — METABOLIC PANEL, BASIC
BUN: 10 mg/dl (ref 7–25)
CO2: 24 mEq/L (ref 21–32)
Calcium: 9.2 mg/dl (ref 8.5–10.1)
Chloride: 102 mEq/L (ref 98–107)
Creatinine: 0.9 mg/dl (ref 0.6–1.3)
GFR est AA: >60
GFR est non-AA: >60
Glucose: 200 mg/dl — ABNORMAL HIGH (ref 74–106)
Potassium: 4 mEq/L (ref 3.5–5.1)
Sodium: 136 mEq/L (ref 136–145)

## 2014-05-25 LAB — CBC WITH AUTOMATED DIFF
BASOPHILS: 0.5 % (ref 0–3)
EOSINOPHILS: 0 % (ref 0–5)
HCT: 40.6 % (ref 37.0–50.0)
HGB: 13.2 gm/dl (ref 13.0–17.2)
IMMATURE GRANULOCYTES: 0.3 % (ref 0.0–3.0)
LYMPHOCYTES: 21.4 % — ABNORMAL LOW (ref 28–48)
MCH: 28.9 pg (ref 25.4–34.6)
MCHC: 32.5 gm/dl (ref 30.0–36.0)
MCV: 88.8 fL (ref 80.0–98.0)
MONOCYTES: 3.8 % (ref 1–13)
MPV: 11.6 fL — ABNORMAL HIGH (ref 6.0–10.0)
NEUTROPHILS: 74 % — ABNORMAL HIGH (ref 34–64)
NRBC: 0 (ref 0–0)
PLATELET: 251 10*3/uL (ref 140–450)
RBC: 4.57 M/uL (ref 3.60–5.20)
RDW-SD: 40.6 (ref 36.4–46.3)
WBC: 10.6 10*3/uL (ref 4.0–11.0)

## 2014-05-25 LAB — LIPASE: Lipase: 109 U/L (ref 73–393)

## 2014-05-25 MED ORDER — SODIUM CHLORIDE 0.9% BOLUS IV
0.9 % | INTRAVENOUS | Status: AC
Start: 2014-05-25 — End: 2014-05-25
  Administered 2014-05-25: 21:00:00 via INTRAVENOUS

## 2014-05-25 MED ORDER — PROMETHAZINE IN NS 25 MG/50 ML IV PIGGY BAG
25 mg/50 ml | INTRAVENOUS | Status: AC
Start: 2014-05-25 — End: 2014-05-25
  Administered 2014-05-25: 21:00:00 via INTRAVENOUS

## 2014-05-25 MED ORDER — SODIUM CHLORIDE 0.9% BOLUS IV
0.9 % | INTRAVENOUS | Status: AC
Start: 2014-05-25 — End: 2014-05-25
  Administered 2014-05-25: 20:00:00 via INTRAVENOUS

## 2014-05-25 MED ORDER — DIPHENHYDRAMINE HCL 50 MG/ML IJ SOLN
50 mg/mL | INTRAMUSCULAR | Status: AC
Start: 2014-05-25 — End: 2014-05-25
  Administered 2014-05-25: 20:00:00 via INTRAVENOUS

## 2014-05-25 MED ORDER — LORAZEPAM 2 MG/ML IJ SOLN
2 mg/mL | INTRAMUSCULAR | Status: AC
Start: 2014-05-25 — End: 2014-05-25
  Administered 2014-05-25: 23:00:00 via INTRAVENOUS

## 2014-05-25 MED ORDER — ONDANSETRON (PF) 4 MG/2 ML INJECTION
4 mg/2 mL | Freq: Once | INTRAMUSCULAR | Status: AC
Start: 2014-05-25 — End: 2014-05-25
  Administered 2014-05-25: 20:00:00 via INTRAVENOUS

## 2014-05-25 MED FILL — PROMETHAZINE IN NS 25 MG/50 ML IV PIGGY BAG: 25 mg/50 ml | INTRAVENOUS | Qty: 50

## 2014-05-25 MED FILL — LORAZEPAM 2 MG/ML IJ SOLN: 2 mg/mL | INTRAMUSCULAR | Qty: 1

## 2014-05-25 MED FILL — DIPHENHYDRAMINE HCL 50 MG/ML IJ SOLN: 50 mg/mL | INTRAMUSCULAR | Qty: 1

## 2014-05-25 MED FILL — ONDANSETRON (PF) 4 MG/2 ML INJECTION: 4 mg/2 mL | INTRAMUSCULAR | Qty: 2

## 2014-05-25 NOTE — Progress Notes (Signed)
Code R: Discharged on 05/04/2014 after inpt stay for DKA.  Presented to ED today with abd pain.  Pt was seen and discharged from ED.

## 2014-05-25 NOTE — ED Notes (Signed)
Patient states nausea and vomiting with left side abdominal pain x this AM.

## 2014-05-25 NOTE — ED Notes (Signed)
[name] [name] is a 29 y.o. female that was discharged in good and improved condition.  The patients diagnosis, condition and treatment were explained to  patient and sister and aftercare instructions were given.  The patient and sister verbalized understanding. Patient armband removed and given to patient to take home.  Patient was informed of the privacy risks if armband lost or stolen.

## 2014-05-26 NOTE — ED Provider Notes (Signed)
[facility]  EMERGENCY DEPARTMENT TREATMENT REPORT  NAME:  [name] [name]  SEX:   F  ADMIT: 05/25/2014  DOB:   03-10-1985  MR#    [id number]  ROOM:  [id number]  TIME DICTATED: 11 34 PM  ACCT#  [id number]        CHIEF COMPLAINT:  Abdominal pain, nausea, vomiting.     HISTORY OF PRESENT ILLNESS:  The patient is a 29 year old female with history of diabetic gastroparesis   with chronic abdominal pain and nausea and vomiting, here today with an   exacerbation of her chronic abdominal pain, nausea and vomiting.  Emesis is   nonbloody, nonbilious.  She has had no fever.  Denies any urinary symptoms.    Nothing squirrely about this pain.  She said her blood sugars have been   \\"okay\\" and the last blood sugar check was earlier today, was 120.    REVIEW OF SYSTEMS:  CONSTITUTIONAL:  No fever, chills, or weight loss.   EYES:  No visual symptoms.   ENT:  No sore throat, runny nose, or other URI symptoms.   HEMATOLOGIC/LYMPHATIC:   No excessive bruising or lymph node swelling.   RESPIRATORY:  No cough, shortness of breath, or wheezing.   CARDIOVASCULAR:  No chest pain, chest pressure, or palpitations.   GASTROINTESTINAL:  As above.    Denies complaints in all other systems.     PAST MEDICAL HISTORY:  Diabetes, gastroparesis, hypertension.    PAST SURGICAL HISTORY:  Includes Cesarean section.    SOCIAL HISTORY:  The patient denies smoking, drinking, drug abuse.    FAMILY HISTORY:  Unremarkable.    ALLERGIES:  PENICILLIN, ZITHROMAX.    CURRENT MEDICATIONS:  Xanax, Celexa, Lantus, Apri, Prinivil, Reglan and Zofran.    PHYSICAL EXAMINATION:  GENERAL:  The patient appears mildly uncomfortable in the bed, but not in   extremis, nontoxic appearing.  VITAL SIGNS:  Temperature 98.3, heart rate 124, respirations 20, pressure   182/105.  HEENT:  Head, there is no evidence of trauma.   ENT:  Mucous membranes are   moist and intact.  Posterior pharynx is nonerythematous.  NECK:  Supple, no JVD.   CARDIOVASCULAR:  Borderline tachycardic.  No murmurs, rubs or gallops.  LUNGS:  Clear bilaterally.  ABDOMEN:  Soft, benign, no pulsatile mass.  No focal tenderness.  She was   nondistended and nontender.    INITIAL ASSESSMENT AND PLAN:  This is a 29 year old female here today with exacerbation of her   gastroparesis, well appearing.  Plan is to establish IV access, IV fluids,   antiemetics.  We will hold off on narcotics, as I do not believe narcotic   analgesics and slowing the bowels will help with gastroparesis flare.  The   patient requests some Benadryl and that is reasonable.  We will send some   basic laboratory studies to evaluate for evidence of diabetic ketoacidosis or   any acute anemia.      DIAGNOSTIC RESULTS:   White count normal at 8.6, hemoglobin 13.2, platelets are 251.  Metabolic   panel:  Blood sugar is 200; no evidence of acidosis.  Electrolytes are   unremarkable.    COURSE IN THE DEPARTMENT:  While the patient was here, she remained stable.  Lipase returned normal at   109.  The patient was given 2 L normal saline, initially Zofran, which still   had some persistent vomiting followed by Phenergan.  Had responded to Ativan   in the past and  was given 1 mg Ativan.    After Ativan, the patient's vomiting has completely resolved.  Her belly   remains benign.  Her heart rate is now into the 90s.  After discussing with   the patient and loved one at the bedside, they are to go home and I think that   is reasonable.  I do not see any indications for acute imaging study or   hospitalization.  Advised to return to the ER should she develop a fever, any   worsening pain or focal pain.      DISPOSITION:   The patient will be discharged to home.      DIAGNOSES:    1.  Abdominal pain, chronic and unchanged from prior.   2.  Nausea and vomiting, chronic and unchanged from prior.      ___________________  Wynelle BourgeoisJonathan A Dennie Vecchio MD  Dictated By: Marland Kitchen.      My signature above authenticates this document and my orders, the final   diagnosis (es), discharge prescription (s), and instructions in the Epic   record.  If you have any questions please contact [phone].    Nursing notes have been reviewed by the physician/ advanced practice   clinician.    [place]  D:05/25/2014 23:34:02  T: 05/26/2014 05:16:03  [id number]

## 2014-05-27 ENCOUNTER — Inpatient Hospital Stay
Admit: 2014-05-27 | Discharge: 2014-05-27 | Disposition: A | Discharge status: Home / Self Care | Payer: MEDICAID | Attending: Emergency Medicine

## 2014-05-27 DIAGNOSIS — E1143 Type 2 diabetes mellitus with diabetic autonomic (poly)neuropathy: Secondary | ICD-10-CM

## 2014-05-27 LAB — POC CHEM8
BUN: 17 mg/dl (ref 7–25)
CALCIUM,IONIZED: 4.6 mg/dL (ref 4.40–5.40)
CO2, TOTAL: 20 mmol/L — ABNORMAL LOW (ref 21–32)
Chloride: 103 mEq/L (ref 98–107)
Creatinine: 0.8 mg/dl (ref 0.6–1.3)
Glucose: 150 mg/dL — ABNORMAL HIGH (ref 74–106)
HCT: 40 % (ref 38–45)
HGB: 13.6 gm/dl (ref 13.0–17.2)
Potassium: 3.6 mEq/L (ref 3.5–4.9)
Sodium: 137 mEq/L (ref 136–145)

## 2014-05-27 LAB — CBC WITH AUTOMATED DIFF
BASOPHILS: 0.6 % (ref 0–3)
EOSINOPHILS: 0.2 % (ref 0–5)
HCT: 37.1 % (ref 37.0–50.0)
HGB: 12.5 gm/dl — ABNORMAL LOW (ref 13.0–17.2)
IMMATURE GRANULOCYTES: 0.3 % (ref 0.0–3.0)
LYMPHOCYTES: 19.6 % — ABNORMAL LOW (ref 28–48)
MCH: 28.9 pg (ref 25.4–34.6)
MCHC: 33.7 gm/dl (ref 30.0–36.0)
MCV: 85.9 fL (ref 80.0–98.0)
MONOCYTES: 4.7 % (ref 1–13)
MPV: 11.3 fL — ABNORMAL HIGH (ref 6.0–10.0)
NEUTROPHILS: 74.6 % — ABNORMAL HIGH (ref 34–64)
NRBC: 0 (ref 0–0)
PLATELET: 261 10*3/uL (ref 140–450)
RBC: 4.32 M/uL (ref 3.60–5.20)
RDW-SD: 38.9 (ref 36.4–46.3)
WBC: 10.1 10*3/uL (ref 4.0–11.0)

## 2014-05-27 LAB — HEPATIC FUNCTION PANEL
ALT (SGPT): 16 U/L (ref 12–78)
AST (SGOT): 13 U/L — ABNORMAL LOW (ref 15–37)
Albumin: 4.2 gm/dl (ref 3.4–5.0)
Alk. phosphatase: 113 U/L (ref 45–117)
Bilirubin, direct: 0.1 mg/dl (ref 0.0–0.2)
Bilirubin, total: 0.5 mg/dl (ref 0.2–1.0)
Protein, total: 8.7 gm/dl — ABNORMAL HIGH (ref 6.4–8.2)

## 2014-05-27 LAB — LIPASE: Lipase: 110 U/L (ref 73–393)

## 2014-05-27 MED ORDER — SODIUM CHLORIDE 0.9 % INJECTION
20 mg/2 mL | INTRAMUSCULAR | Status: AC
Start: 2014-05-27 — End: 2014-05-27
  Administered 2014-05-27: 20:00:00 via INTRAVENOUS

## 2014-05-27 MED ORDER — SODIUM CHLORIDE 0.9% BOLUS IV
0.9 % | INTRAVENOUS | Status: AC
Start: 2014-05-27 — End: 2014-05-27
  Administered 2014-05-27: 20:00:00 via INTRAVENOUS

## 2014-05-27 MED ORDER — SODIUM CHLORIDE 0.9% BOLUS IV
0.9 % | INTRAVENOUS | Status: AC
Start: 2014-05-27 — End: 2014-05-27
  Administered 2014-05-27: 17:00:00 via INTRAVENOUS

## 2014-05-27 MED ORDER — DIPHENHYDRAMINE HCL 50 MG/ML IJ SOLN
50 mg/mL | INTRAMUSCULAR | Status: AC
Start: 2014-05-27 — End: 2014-05-27
  Administered 2014-05-27: 17:00:00 via INTRAVENOUS

## 2014-05-27 MED ORDER — METOCLOPRAMIDE 5 MG/ML IJ SOLN
5 mg/mL | INTRAMUSCULAR | Status: AC
Start: 2014-05-27 — End: 2014-05-27
  Administered 2014-05-27: 17:00:00 via INTRAVENOUS

## 2014-05-27 MED ORDER — LORAZEPAM 2 MG/ML IJ SOLN
2 mg/mL | INTRAMUSCULAR | Status: AC
Start: 2014-05-27 — End: 2014-05-27
  Administered 2014-05-27: 17:00:00 via INTRAVENOUS

## 2014-05-27 MED ORDER — LISINOPRIL 20 MG TAB
20 mg | ORAL | Status: AC
Start: 2014-05-27 — End: 2014-05-27
  Administered 2014-05-27: 21:00:00 via ORAL

## 2014-05-27 MED ORDER — MORPHINE 4 MG/ML SYRINGE
4 mg/mL | INTRAMUSCULAR | Status: AC
Start: 2014-05-27 — End: 2014-05-27
  Administered 2014-05-27: 19:00:00 via INTRAVENOUS

## 2014-05-27 MED FILL — DIPHENHYDRAMINE HCL 50 MG/ML IJ SOLN: 50 mg/mL | INTRAMUSCULAR | Qty: 1

## 2014-05-27 MED FILL — MORPHINE 4 MG/ML SYRINGE: 4 mg/mL | INTRAMUSCULAR | Qty: 1

## 2014-05-27 MED FILL — LORAZEPAM 2 MG/ML IJ SOLN: 2 mg/mL | INTRAMUSCULAR | Qty: 1

## 2014-05-27 MED FILL — SODIUM CHLORIDE 0.9 % INJECTION: INTRAMUSCULAR | Qty: 10

## 2014-05-27 MED FILL — SODIUM CHLORIDE 0.9 % IV: INTRAVENOUS | Qty: 1000

## 2014-05-27 MED FILL — LISINOPRIL 20 MG TAB: 20 mg | ORAL | Qty: 1

## 2014-05-27 MED FILL — METOCLOPRAMIDE 5 MG/ML IJ SOLN: 5 mg/mL | INTRAMUSCULAR | Qty: 2

## 2014-05-27 NOTE — ED Notes (Signed)
Patient resting quietly awaiting ride home, will continue to monitor.

## 2014-05-27 NOTE — ED Notes (Signed)
Patient is resting with eyes closed, has ambulated to restroom twice with assistance, still c/o no improvement in left lower quadrant of abdomen pain, no further emesis at this time, no acute distress noted, will continue to monitor.

## 2014-05-27 NOTE — ED Provider Notes (Signed)
[facility]  EMERGENCY DEPARTMENT TREATMENT REPORT  NAME:  [name] [name]  SEX:   F  ADMIT: 05/27/2014  DOB:   August 25, 1985  MR#    [id number]  ROOM:  UE45  TIME DICTATED: 01 26 PM  ACCT#  [id number]    cc: Jerral Bonito MD    TIME OF EVALUATION:  12:28 p.m.    PRIMARY CARE PHYSICIAN:  Jerral Bonito, MD    CHIEF COMPLAINT:  Throwing up.    HISTORY OF PRESENT ILLNESS:  A 29 year old female with a history of diabetic gastroparesis was seen here 2   days ago and discharged after medication for same symptoms, but has been   throwing up over 10 episodes.  The patient was admitted at the beginning of   the month for similar symptoms, has not had a chance to see her PCP or her    GI, although she follows up in 3 days at [facility] with she believes, a GI   specialist there that she has not yet seen.  She denies any atypical symptoms   and relates left upper and lower abdominal tenderness as consistent with prior   episodes of gastroparesis.    REVIEW OF SYSTEMS:  CONSTITUTIONAL:  No fever, chills, or weight loss.  EYES:  No visual symptoms.   ENT:  No sore throat, runny nose, or other URI symptoms.   RESPIRATORY:  No cough, shortness of breath or wheezing.  CARDIOVASCULAR:  No chest pain, chest pressure, or palpitations.   GASTROINTESTINAL:   As above.  No bowel movement in 3 days, which the patient   relates to decreased p.o. intake.    GENITOURINARY:  No dysuria, frequency, or urgency.  No vaginal bleeding or discharge.  MUSCULOSKELETAL:  No joint pain or swelling.   INTEGUMENTARY:  No rashes.  NEUROLOGIC:  No focal sensory or motor symptoms.  Denies headache.    PAST MEDICAL HISTORY:  Insulin-dependent diabetes, gastroparesis, hypertension.    SOCIAL HISTORY:  Nonsmoker, no ETOH or drug use.    FAMILY HISTORY:  Unknown.    ALLERGIES:  PENICILLIN AND ZITHROMAX.    MEDICATIONS:  Reviewed in Epic.    PHYSICAL EXAMINATION:  VITAL SIGNS:  Blood pressure 172/105, pulse 112, respirations 18, temperature    98.4, pain 9 out of 10, O2 sats 100% on room air.  GENERAL APPEARANCE:  Patient appears well developed and well nourished.    Appearance and behavior are age and situation appropriate.  The patient at   times appears comfortable, at times quite uncomfortable.  HEENT:  Eyes:  Conjunctivae clear, lids normal.  Pupils equal, symmetrical,   and normally reactive.  Ears/Nose:  Hearing is grossly intact to voice.  Internal and external   examinations of the ears and nose are unremarkable.  Mouth/Throat:  Surfaces   of the pharynx, palate, and tongue are pink, moist, and without lesions.    Nasal mucosa, septum, and turbinates unremarkable.  Teeth and gums   unremarkable.  RESPIRATORY:  Clear and equal breath sounds.  No respiratory distress,   tachypnea, or accessory muscle use.    CARDIOVASCULAR:  Heart regular, without murmurs, gallops, rubs or thrills.  GASTROINTESTINAL:  Abdomen tender to palpation in left upper and lower   quadrant.  No other abdominal tenderness.  No rebound, no guarding, no right   lower tenderness.  GENITOURINARY:  Deferred due GI symptoms and upper abdominal tenderness.  MUSCULOSKELETAL:   SPINE:  There is no localized cervical, thoracic, lumbar or  sacral body tenderness to palpation or fist percussion.  There are no bony   step-offs, ecchymosis, areas of soft tissue swelling or deformities.  SKIN:  Warm and dry without rashes.  NEUROLOGIC:  Alert, oriented.  Sensation intact, motor strength equal and   symmetric.     CONTINUATION BY Vinnie LangtonERIK Vedant Shehadeh, MD    INITIAL ASSESSMENT AND MANAGEMENT PLAN:  The patient is here who has gastroparesis symptoms again.  She will be   evaluated further and treated symptomatically.  This is return of symptoms she   had 2 days ago, often she is able to improve in the Emergency Department and   get better.    LABORATORY TESTS:  The patient had a normal CBC, electrolytes, CO2 slightly low at 20 showing   dehydration.  She had a normal lipase and hepatic profile.       EMERGENCY DEPARTMENT COURSE:  The patient was hydrated with IV fluids.  Given Ativan, Reglan and Benadryl.    A second liter was order as she was starting to feel a little better now and   also giving her Pepcid.  Dr. Buddy DutyZydron will follow the patient further until she   is well enough to go home.     CLINICAL IMPRESSION:  Acute gastroparesis with abdominal pain.      CONTINUATION BY Jannetta QuintOURTNEY ZYDRON, MD:        ED COURSE CONTINUED:  Seeing this patient as a sign out from Dr. Vinnie LangtonErik Divya Munshi pending continued   hydration and medication.  I did not need to administer any new drugs to the   patient as she was no longer vomiting.  She was resting comfortably when I   went in several hours later and the patient was no longer vomiting and felt   comfortable going home.  This patient has  a longstanding history of   gastroparesis and she does have outpatient followup.      ___________________  Smitty CordsErik H Kainen Struckman MD  Dictated By: Mearl LatinBryan P. Lockie ParesHendrick, PA-C    My signature above authenticates this document and my orders, the final   diagnosis (es), discharge prescription (s), and instructions in the Epic   record.  If you have any questions please contact [phone].    Nursing notes have been reviewed by the physician/ advanced practice   clinician.    BB  D:05/27/2014 13:26:12  T: 05/27/2014 18:40:59  [id number]

## 2014-05-27 NOTE — ED Notes (Signed)
6:44 PM  05/27/2014     Discharge instructions given to patient, [name] [name], with verbalization of understanding. Patient was alone, ambulated to Observation Unit exit without assistance, personal belongings in patient's possession.  Patient discharged with no new prescriptions. Patient discharged to home/self-care.      Donald SivaWENDY L Suzy Kugel, RN

## 2014-05-27 NOTE — ED Notes (Signed)
Patient states she woke up at approx. 0700 with nausea, vomiting, and left lower quadrant pain.  Emesis x 5 per patient, patient states she has not had anything to eat today because she "can't keep anything down".

## 2014-05-27 NOTE — ED Notes (Signed)
Patient is resting with eyes closed, no further episodes of emesis noted at this time, will continue to monitor.

## 2014-05-27 NOTE — Progress Notes (Addendum)
Pt is here for  nausea, vomiting, and left lower quadrant pain.?? Emesis x 5 per patient,per notes/ Code R initiated as was here for admit 4.6.16 with DKA and d/c to home 4.7.16. Will continue to follow for information.  Code R no new information and no notes as of yet from MD> will follow/ being observed at time/    She was d/c to home 4.30.16/

## 2014-05-29 ENCOUNTER — Inpatient Hospital Stay
Admit: 2014-05-29 | Discharge: 2014-06-04 | Disposition: A | Discharge status: Home / Self Care | Payer: MEDICAID | Attending: Internal Medicine | Admitting: Internal Medicine

## 2014-05-29 DIAGNOSIS — E1043 Type 1 diabetes mellitus with diabetic autonomic (poly)neuropathy: Principal | ICD-10-CM

## 2014-05-29 LAB — METABOLIC PANEL, COMPREHENSIVE
ALT (SGPT): 16 U/L (ref 12–78)
AST (SGOT): 14 U/L — ABNORMAL LOW (ref 15–37)
Albumin: 3.7 gm/dl (ref 3.4–5.0)
Alk. phosphatase: 94 U/L (ref 45–117)
BUN: 9 mg/dl (ref 7–25)
Bilirubin, total: 0.7 mg/dl (ref 0.2–1.0)
CO2: 23 mEq/L (ref 21–32)
Calcium: 8.8 mg/dl (ref 8.5–10.1)
Chloride: 102 mEq/L (ref 98–107)
Creatinine: 1 mg/dl (ref 0.6–1.3)
GFR est AA: >60
GFR est non-AA: >60
Glucose: 152 mg/dl — ABNORMAL HIGH (ref 74–106)
Potassium: 2.9 mEq/L — CL (ref 3.5–5.1)
Protein, total: 8 gm/dl (ref 6.4–8.2)
Sodium: 137 mEq/L (ref 136–145)

## 2014-05-29 LAB — CBC WITH AUTOMATED DIFF
BASOPHILS: 0.5 % (ref 0–3)
EOSINOPHILS: 0 % (ref 0–5)
HCT: 38.6 % (ref 37.0–50.0)
HGB: 12.3 gm/dl — ABNORMAL LOW (ref 13.0–17.2)
IMMATURE GRANULOCYTES: 0.1 % (ref 0.0–3.0)
LYMPHOCYTES: 25.9 % — ABNORMAL LOW (ref 28–48)
MCH: 28.3 pg (ref 25.4–34.6)
MCHC: 31.9 gm/dl (ref 30.0–36.0)
MCV: 88.7 fL (ref 80.0–98.0)
MONOCYTES: 5 % (ref 1–13)
MPV: 11.7 fL — ABNORMAL HIGH (ref 6.0–10.0)
NEUTROPHILS: 68.5 % — ABNORMAL HIGH (ref 34–64)
NRBC: 0 (ref 0–0)
PLATELET: 267 10*3/uL (ref 140–450)
RBC: 4.35 M/uL (ref 3.60–5.20)
RDW-SD: 41.1 (ref 36.4–46.3)
WBC: 8.6 10*3/uL (ref 4.0–11.0)

## 2014-05-29 LAB — POC HCG,URINE: HCG urine, QL: NEGATIVE

## 2014-05-29 LAB — POC URINE MACROSCOPIC
Blood: NEGATIVE
Glucose: 100 mg/dl — AB
Ketone: 15 mg/dl — AB
Nitrites: NEGATIVE
Protein: 100 mg/dl — AB
Specific gravity: 1.02 (ref 1.005–1.030)
Urobilinogen: 1 EU/dl (ref 0.0–1.0)
pH (UA): 6.5 (ref 5–9)

## 2014-05-29 LAB — LIPASE: Lipase: 81 U/L (ref 73–393)

## 2014-05-29 LAB — GLUCOSE, POC: Glucose (POC): 225 mg/dL — ABNORMAL HIGH (ref 65–105)

## 2014-05-29 LAB — POC URINE MICROSCOPIC

## 2014-05-29 MED ORDER — ONDANSETRON (PF) 4 MG/2 ML INJECTION
4 mg/2 mL | Freq: Once | INTRAMUSCULAR | Status: AC
Start: 2014-05-29 — End: 2014-05-29
  Administered 2014-05-29: 13:00:00 via INTRAVENOUS

## 2014-05-29 MED ORDER — ACETAMINOPHEN 325 MG TABLET
325 mg | Freq: Four times a day (QID) | ORAL | Status: DC | PRN
Start: 2014-05-29 — End: 2014-06-04

## 2014-05-29 MED ORDER — NALOXONE 0.4 MG/ML INJECTION
0.4 mg/mL | INTRAMUSCULAR | Status: DC | PRN
Start: 2014-05-29 — End: 2014-06-04

## 2014-05-29 MED ORDER — LISINOPRIL 20 MG TAB
20 mg | Freq: Every day | ORAL | Status: DC
Start: 2014-05-29 — End: 2014-06-04
  Administered 2014-05-30 – 2014-06-04 (×9): via ORAL

## 2014-05-29 MED ORDER — POTASSIUM CHLORIDE 10 MEQ/100 ML IV PIGGY BACK
10 mEq/0 mL | INTRAVENOUS | Status: AC
Start: 2014-05-29 — End: 2014-05-30
  Administered 2014-05-29 – 2014-05-30 (×4): via INTRAVENOUS

## 2014-05-29 MED ORDER — DIPHENHYDRAMINE HCL 50 MG/ML IJ SOLN
50 mg/mL | INTRAMUSCULAR | Status: AC
Start: 2014-05-29 — End: 2014-05-29
  Administered 2014-05-29: 13:00:00 via INTRAVENOUS

## 2014-05-29 MED ORDER — DIPHENHYDRAMINE HCL 50 MG/ML IJ SOLN
50 mg/mL | Freq: Four times a day (QID) | INTRAMUSCULAR | Status: DC | PRN
Start: 2014-05-29 — End: 2014-06-04
  Administered 2014-05-29 – 2014-06-04 (×19): via INTRAVENOUS

## 2014-05-29 MED ORDER — FAMOTIDINE (PF) 20 MG/2 ML IV
20 mg/2 mL | INTRAVENOUS | Status: AC
Start: 2014-05-29 — End: 2014-05-29
  Administered 2014-05-29: 15:00:00 via INTRAVENOUS

## 2014-05-29 MED ORDER — ENOXAPARIN 40 MG/0.4 ML SUB-Q SYRINGE
40 mg/0.4 mL | SUBCUTANEOUS | Status: DC
Start: 2014-05-29 — End: 2014-06-04
  Administered 2014-05-30: 21:00:00 via SUBCUTANEOUS

## 2014-05-29 MED ORDER — METOCLOPRAMIDE 5 MG/5 ML ORAL SOLN
5 mg/ mL | Freq: Three times a day (TID) | ORAL | Status: DC
Start: 2014-05-29 — End: 2014-06-02
  Administered 2014-05-29 – 2014-06-02 (×9): via ORAL

## 2014-05-29 MED ORDER — ONDANSETRON (PF) 4 MG/2 ML INJECTION
4 mg/2 mL | INTRAMUSCULAR | Status: DC | PRN
Start: 2014-05-29 — End: 2014-06-04
  Administered 2014-05-30 – 2014-06-02 (×3): via INTRAVENOUS

## 2014-05-29 MED ORDER — SODIUM CHLORIDE 0.9% BOLUS IV
0.9 % | INTRAVENOUS | Status: AC
Start: 2014-05-29 — End: 2014-05-29
  Administered 2014-05-29: 15:00:00 via INTRAVENOUS

## 2014-05-29 MED ORDER — SODIUM CHLORIDE 0.9 % IJ SYRG
Freq: Three times a day (TID) | INTRAMUSCULAR | Status: DC
Start: 2014-05-29 — End: 2014-06-04
  Administered 2014-05-29 – 2014-06-04 (×15): via INTRAVENOUS

## 2014-05-29 MED ORDER — LORAZEPAM 1 MG TAB
1 mg | ORAL | Status: DC
Start: 2014-05-29 — End: 2014-05-29

## 2014-05-29 MED ORDER — SODIUM CHLORIDE 0.9 % IJ SYRG
Freq: Once | INTRAMUSCULAR | Status: AC
Start: 2014-05-29 — End: 2014-05-29
  Administered 2014-05-29: 13:00:00 via INTRAVENOUS

## 2014-05-29 MED ORDER — CITALOPRAM 20 MG TAB
20 mg | Freq: Every day | ORAL | Status: DC
Start: 2014-05-29 — End: 2014-06-04
  Administered 2014-05-30 – 2014-06-04 (×8): via ORAL

## 2014-05-29 MED ORDER — SODIUM CHLORIDE 0.9 % IJ SYRG
INTRAMUSCULAR | Status: DC | PRN
Start: 2014-05-29 — End: 2014-06-04
  Administered 2014-05-30 – 2014-06-02 (×2): via INTRAVENOUS

## 2014-05-29 MED ORDER — INSULIN GLULISINE 100 UNIT/ML INJECTION
100 unit/mL | Freq: Three times a day (TID) | SUBCUTANEOUS | Status: DC
Start: 2014-05-29 — End: 2014-05-30

## 2014-05-29 MED ORDER — NS WITH POTASSIUM CHLORIDE 20 MEQ/L IV
20 mEq/L | INTRAVENOUS | Status: DC
Start: 2014-05-29 — End: 2014-06-04
  Administered 2014-05-29 – 2014-06-04 (×12): via INTRAVENOUS

## 2014-05-29 MED ORDER — SODIUM CHLORIDE 0.9% BOLUS IV
0.9 % | INTRAVENOUS | Status: AC
Start: 2014-05-29 — End: 2014-05-29
  Administered 2014-05-29: 13:00:00 via INTRAVENOUS

## 2014-05-29 MED ORDER — LORAZEPAM 2 MG/ML IJ SOLN
2 mg/mL | INTRAMUSCULAR | Status: AC
Start: 2014-05-29 — End: 2014-05-29
  Administered 2014-05-29: 16:00:00 via INTRAVENOUS

## 2014-05-29 MED FILL — ONDANSETRON (PF) 4 MG/2 ML INJECTION: 4 mg/2 mL | INTRAMUSCULAR | Qty: 2

## 2014-05-29 MED FILL — METOCLOPRAMIDE 5 MG/5 ML ORAL SOLN: 5 mg/ mL | ORAL | Qty: 10

## 2014-05-29 MED FILL — DIPHENHYDRAMINE HCL 50 MG/ML IJ SOLN: 50 mg/mL | INTRAMUSCULAR | Qty: 1

## 2014-05-29 MED FILL — LORAZEPAM 2 MG/ML IJ SOLN: 2 mg/mL | INTRAMUSCULAR | Qty: 1

## 2014-05-29 MED FILL — NS WITH POTASSIUM CHLORIDE 20 MEQ/L IV: 20 mEq/L | INTRAVENOUS | Qty: 1000

## 2014-05-29 MED FILL — LORAZEPAM 1 MG TAB: 1 mg | ORAL | Qty: 1

## 2014-05-29 MED FILL — FAMOTIDINE (PF) 20 MG/2 ML IV: 20 mg/2 mL | INTRAVENOUS | Qty: 2

## 2014-05-29 MED FILL — POTASSIUM CHLORIDE 10 MEQ/100 ML IV PIGGY BACK: 10 mEq/0 mL | INTRAVENOUS | Qty: 100

## 2014-05-29 MED FILL — BD POSIFLUSH NORMAL SALINE 0.9 % INJECTION SYRINGE: INTRAMUSCULAR | Qty: 10

## 2014-05-29 MED FILL — APIDRA U-100 INSULIN 100 UNIT/ML SUBCUTANEOUS SOLUTION: 100 unit/mL | SUBCUTANEOUS | Qty: 0.2

## 2014-05-29 NOTE — H&P (Signed)
History and Physical      NAME:  [name] [name]   DOB:   [birth year]   MRN:   [id number]     Date/Time:  05/29/2014     CHIEF COMPLAINT: nausea and vomiting     HISTORY OF PRESENT ILLNESS:     [name] [name] is a 29 y.o.   female with h/o DM-1, gastroparesis and HTN who presents with c/c of intractable nausea and vomiting.  Patient has multiple hospital admissions in the past for same complaint secondary to her gastroparesis. She denies fever or chills.  No chest pain or SOB. Denies abdominal pain or diarrhea. He in ED she was found to have dehydration and hypokalemia.  She was given IVF, K replacement and zofran. No significant improvement and admitted for further evaluation.      Past Medical History   Diagnosis Date   ??? Diabetes ([place])    ??? Gastroparesis    ??? Gastroparesis    ??? Hypertension         Past Surgical History   Procedure Laterality Date   ??? Hx cesarean section         History   Substance Use Topics   ??? Smoking status: Never Smoker    ??? Smokeless tobacco: Not on file   ??? Alcohol Use: No        No family history on file.     Allergies   Allergen Reactions   ??? Penicillins Hives   ??? Zithromax [Azithromycin] Hives        Prior to Admission medications    Medication Sig Start Date End Date Taking? Authorizing Provider   diphenhydrAMINE (BENADRYL) 25 mg capsule Take 25 mg by mouth every six (6) hours as needed. Indications: NAUSEA   Yes Historical Provider   LORazepam (ATIVAN) 1 mg tablet Take 1 mg by mouth every four (4) hours as needed for Anxiety (pt states she takes it for gastroparesis pain).   Yes Historical Provider   insulin glargine (LANTUS SOLOSTAR) 100 unit/mL (3 mL) pen 25 Units by SubCUTAneous route nightly. 05/04/14  Yes Barkley Boards, DO   citalopram (CELEXA) 20 mg tablet Take 20 mg by mouth daily.   Yes Phys Other, MD   lisinopril (PRINIVIL, ZESTRIL) 20 mg tablet Take 20 mg by mouth daily.   Yes Miguel Rota, PA    metoclopramide HCl (REGLAN) 5 mg/5 mL syrup Take 1 tsp by mouth Before breakfast, lunch, and dinner.   Yes Grant Ruts, MD   insulin glulisine (APIDRA SOLOSTAR) 100 unit/mL pen 5 Units by SubCUTAneous route Before breakfast, lunch, and dinner. Indications: TYPE 1 DIABETES MELLITUS, pt states this is her sliding scale insulin   Yes Karma Greaser, MD       REVIEW OF SYSTEMS:     General: negative for fever, chills, sweats, weakness  Eyes: negative for blurred vision, eye pain, loss of vision, diplopia  Ear Nose and Throat: negative for rhinorrhea, pharyngitis, otalgia, tinnitus, speech or swallowing difficulties  Respiratory:  negative for cough, sputum production, SOB, wheezing, DOE, pleuritic pain  Cardiology:  negative for chest pain, palpitations, orthopnea, PND, edema, syncope   Gastrointestinal: positive for nausea and vomiting  Genitourinary: negative for frequency, urgency, dysuria, hematuria, incontinence  Muskuloskeletal : negative for arthralgia, myalgia  Neurological: negative for headache, dizziness, confusion, focal weakness, paresthesia, memory loss, gait disturbance  Review of systems otherwise negative         Physical Exam:     VITALS:  Vital signs reviewed; most recent are:    Visit Vitals   Item Reading   ??? BP 151/92 mmHg   ??? Pulse 101   ??? Temp 98.6 ??F (37 ??C)   ??? Resp 20   ??? Ht $Remo'5\' 6"'SbsGX$  (1.676 m)   ??? Wt 50.576 kg (111 lb 8 oz)   ??? BMI 18.01 kg/m2   ??? SpO2 100%   ??? Breastfeeding No     SpO2 Readings from Last 6 Encounters:   05/29/14 100%   05/27/14 100%   05/25/14 98%   05/12/14 100%   05/07/14 100%   05/05/14 100%        No intake or output data in the 24 hours ending 05/29/14 2254       General   well developed, well nourished, appears stated age, in no acute distress  EENT  Normocephalic, Atraumatic, PERRLA, EOMI, sclera clear, nares clear, pharynx normal  Neck   Supple without nodes or mass. No thyromegaly or bruit  Respiratory   Clear To Auscultation bilaterally - no wheezes, rales,  rhonchi, or crackles  Cardiology  Regular Rate and Rythmn  - no murmurs, rubs or gallops  Abdominal  Soft, non-tender, non-distended, positive bowel sounds, no hepatosplenomegaly  Extremities  No clubbing, cyanosis, or edema. Pulses intact.  Skin  Normal skin turgor.  No rashes or skin ulcers noted  Neurological  No focal neurological deficits noted  Exam otherwise negative     Labs:  Recent Results (from the past 24 hour(s))   CBC WITH AUTOMATED DIFF    Collection Time: 05/29/14  9:11 AM   Result Value Ref Range    WBC 8.6 4.0 - 11.0 1000/mm3    RBC 4.35 3.60 - 5.20 M/uL    HGB 12.3 (L) 13.0 - 17.2 gm/dl    HCT 38.6 37.0 - 50.0 %    MCV 88.7 80.0 - 98.0 fL    MCH 28.3 25.4 - 34.6 pg    MCHC 31.9 30.0 - 36.0 gm/dl    PLATELET 267 140 - 450 1000/mm3    MPV 11.7 (H) 6.0 - 10.0 fL    RDW-SD 41.1 36.4 - 46.3      NRBC 0 0 - 0      IMMATURE GRANULOCYTES 0.1 0.0 - 3.0 %    NEUTROPHILS 68.5 (H) 34 - 64 %    LYMPHOCYTES 25.9 (L) 28 - 48 %    MONOCYTES 5.0 1 - 13 %    EOSINOPHILS 0.0 0 - 5 %    BASOPHILS 0.5 0 - 3 %   METABOLIC PANEL, COMPREHENSIVE    Collection Time: 05/29/14  9:11 AM   Result Value Ref Range    Sodium 137 136 - 145 mEq/L    Potassium 2.9 (LL) 3.5 - 5.1 mEq/L    Chloride 102 98 - 107 mEq/L    CO2 23 21 - 32 mEq/L    Glucose 152 (H) 74 - 106 mg/dl    BUN 9 7 - 25 mg/dl    Creatinine 1.0 0.6 - 1.3 mg/dl    GFR est AA >60.0      GFR est non-AA >60      Calcium 8.8 8.5 - 10.1 mg/dl    AST 14 (L) 15 - 37 U/L    ALT 16 12 - 78 U/L    Alk. phosphatase 94 45 - 117 U/L    Bilirubin, total 0.7 0.2 - 1.0 mg/dl    Protein, total 8.0 6.4 -  8.2 gm/dl    Albumin 3.7 3.4 - 5.0 gm/dl   LIPASE    Collection Time: 05/29/14  9:11 AM   Result Value Ref Range    Lipase 81 73 - 393 U/L   HEMOGLOBIN A1C W/O EAG    Collection Time: 05/29/14  9:12 AM   Result Value Ref Range    Hemoglobin A1c 8.3 (H) 4.8 - 6.0 %   POC URINE MACROSCOPIC    Collection Time: 05/29/14  9:16 AM   Result Value Ref Range     Glucose 100 (A) NEGATIVE,Negative mg/dl    Bilirubin Small (A) NEGATIVE,Negative      Ketone 15 (A) NEGATIVE,Negative mg/dl    Specific gravity 5.130 1.005 - 1.030      Blood Negative NEGATIVE,Negative      pH (UA) 6.5 5 - 9      Protein 100 (A) NEGATIVE,Negative mg/dl    Urobilinogen 1.0 0.0 - 1.0 EU/dl    Nitrites Negative NEGATIVE,Negative      Leukocyte Esterase Trace (A) NEGATIVE,Negative      Color Yellow      Appearance Clear     POC URINE MICROSCOPIC    Collection Time: 05/29/14  9:16 AM   Result Value Ref Range    EPITHELIAL CELLS, SQUAMOUS 10-14 /LPF    WBC 1-4 /HPF    RBC OCCASIONAL /HPF    Bacteria 1+ /HPF    CRYSTALS, CALCIUM OXALATE 2+ /HPF   POC HCG,URINE    Collection Time: 05/29/14  9:20 AM   Result Value Ref Range    HCG urine, Ql. negative NEGATIVE,Negative,negative     GLUCOSE, POC    Collection Time: 05/29/14  4:30 PM   Result Value Ref Range    Glucose (POC) 225 (H) 65 - 105 mg/dL   GLUCOSE, POC    Collection Time: 05/29/14  9:33 PM   Result Value Ref Range    Glucose (POC) 224 (H) 65 - 105 mg/dL         Active Problems:    * No active hospital problems. *         Assessment:       1.  Intractable Nausea and vomiting  2. Hypokalemia  3. Gastroparesis  4. DM-I, uncontrolled       Plan:      ?? Continue IVF hydration  ?? Replete K  ?? Start on Lantus, humalog SSI  ?? Monitor lytes  ?? Continue reglan and zofran      Prophylaxis:  Lovenox  Coumadin  Hep SQ  SCD???s  H2B/PPI    Disposition:  Home w/ Family   HH PT,OT,RN   SNF/LTC   SAH/Rehab    Care Plan discussed with:    Patient   Family    ED Care Manager  ED Doc   Specialist :      Total time:  50 minutes             _______________________________________________________________________        Attending Physician: Theodis Aguas, MD      [facility]

## 2014-05-29 NOTE — ED Notes (Signed)
Spoke to sec aida stated to sbar report  Per nurse Coca Colamichelle

## 2014-05-29 NOTE — ED Provider Notes (Signed)
[facility]  EMERGENCY DEPARTMENT TREATMENT REPORT  NAME:  [name] [name]  SEX:   F  ADMIT: 05/29/2014  DOB:   April 13, 1985  MR#    [id number]  ROOM:  CD20  TIME DICTATED: 09 05 AM  ACCT#  [id number]    cc: Jerral Bonito MD    I hereby certify this patient for admission based upon medical necessity as   noted below:      TIME OF EVALUATION:  6045    PRIMARY CARE PHYSICIAN:  Jerral Bonito, MD    CHIEF COMPLAINT:  Abdominal pain, vomiting.    HISTORY OF PRESENT ILLNESS:  A 29 year old female with a history of gastroparesis, chronic abdominal pain,   nausea and vomiting.  Evaluated for the same symptoms 4 days ago and again 2   days ago.  States she felt well after being discharged 2 days ago. Then around   2100 yesterday evening nausea and vomiting returned.  She has vomited   approximately 6 to 7 times throughout the evening.  Left-sided abdominal pain   also returned, which is where she has her chronic pain when her symptoms   occur.  This pain is 9 out of 10, constant, no radiation.  Just prior to onset   of symptoms she had checked her blood sugar at home as she is an   insulin-dependent diabetic and it was low at 47.  She tried to eat then her   symptoms began.  Denies diarrhea, fever, irritative voiding symptoms.    REVIEW OF SYSTEMS:  CONSTITUTIONAL:  No fever, chills, or sweats.  EYES:  No visual symptoms.   ENT:  No sore throat, runny nose or other URI symptoms.  RESPIRATORY:  No cough, shortness of breath or wheezing.    CARDIOVASCULAR:  No chest pain, chest pressure or palpitations.     GASTROINTESTINAL:  As above.  No hematochezia, melena, dark or tarry stools.  GENITOURINARY:  No dysuria, frequency, or urgency.  No hematuria.   MUSCULOSKELETAL:  No joint pain.  INTEGUMENTARY:  No rashes.  NEUROLOGIC:  No headache.    PAST MEDICAL HISTORY:  Insulin-dependent diabetic gastroparesis, hypertension, C-section.    SOCIAL HISTORY:  No tobacco, alcohol or recreational drug use.  Denies any chance of pregnancy    as she is on Depo birth control.    FAMILY HISTORY:  Father with history of diabetes.    ALLERGIES:  PENICILLINS, ZITHROMAX.    MEDICATIONS:  Celexa, Lantus, Apidra, lisinopril, Reglan.    PHYSICAL EXAMINATION:  VITAL SIGNS:  BP 183/104, pulse 111, respirations 18, temperature 98.2, pain   9, O2 saturation 100% on room air.  GENERAL APPEARANCE:  The patient appears well developed, well nourished.  HEENT: Eyes:  Conjunctivae clear, lids normal.  Pupils equal, symmetrical, and   normally reactive.  ENT: Mouth/throat:  Oral mucosa is pink and moist.  No   lesions.  Posterior pharynx unremarkable.  RESPIRATORY:  Clear and equal breath sounds.  No respiratory distress,   tachypnea or accessory muscle use.   CARDIOVASCULAR:  Regular rate and rhythm.  No murmurs.  GASTROINTESTINAL:  Abdomen is soft, nondistended.  Minimal tenderness along   the left side of the abdomen, the rest of the abdomen nontender, no rebound,   guarding of peritonitis.  No abdominal or inguinal masses appreciated by   inspection or palpation.  No hepatomegaly or splenomegaly bilaterally.  No CVA   tenderness.  MUSCULOSKELETAL:  Moves all 4 extremities spontaneously.  SKIN:  Warm  and dry without rashes.       CONTINUATION BY Holston Oyama TSUCHITANI, MD:    DIAGNOSTIC STUDIES:  CBC:  Normal white count of 8.6, hemoglobin 12.3, platelets are normal .   Urine:  100 protein, 100 glucose, 15 ketones, small bilirubin, trace leukocyte   esterase, 10 to 14 squamous cells, 1 to 4 whites, occasional reds, 1+   bacteria, culture pending.  Pregnancy negative.  Chemistry: Potassium 2.9,   glucose 152.  There is a normal bicarb.  LFTs, lipase are not elevated.  I   have cultured the urine, she is not having any irritative voiding symptoms.    ER COURSE:  The patient presents with persistent vomiting with a history of gastroparesis.    Abdomen soft, benign, no localizing tenderness.  She states a history of    same symptoms with her gastroparesis.  She states that she is not having any   hematemesis.  She states she is still compliant with her insulin, despite the   vomiting.  She was medicated with Benadryl and Zofran and given a liter of   fluids.  She had persistent vomiting.  Our plan had been to potentially give   her some p.o. potassium and discharge her if she could tolerate it, but she had   persistent vomiting, was given Ativan, had persistent nausea, spitting up, so   will to be admitted because her potassium is low enough that I think I think   it needs to be repleted.  It will have to be IV and she cannot tolerate the   p.o.  Her potassium has slowly been trending downwards, on the 20th it was 4, on the 30th   it was 3.6 and today 2.9.  So, in discussion with Dr. Tenny Crawerefe, he will accept   her to observation telemetry.  We have ordered four 40 mEq total  of IV   potassium over 4 hours and she will be placed on observation telemetry because   of the hypokalemia.  It does not appear that she is in DKA. Again, no   irritative voiding symptoms, so I do not think that she needs to have her   urine treated.  We will culture it, follow the culture.    DISPOSITION:  Observation telemetry.    DIAGNOSES:  1.  Intractable vomiting.  2.  Hypokalemia.      ___________________  Judeen HammansSara N Tsuchitani MD  Dictated By: Verlin Grillsolleen Oleary, PA    My signature above authenticates this document and my orders, the final   diagnosis (es), discharge prescription (s), and instructions in the Epic   record.  If you have any questions please contact [phone].    Nursing notes have been reviewed by the physician/ advanced practice   clinician.    [facility]  D:05/29/2014 09:05:41  T: 05/29/2014 15:10:23  [id number]

## 2014-05-29 NOTE — Progress Notes (Signed)
No needs identified. WIll monitor progress.  Case Management Assessment      Preferred Language for Healthcare Related Communication     Preferred Language for Healthcare Related Communication: English   Spiritual/Ethnic/Cultural/Religious Needs that Should be Incorporated Into Your Care          FUNCTIONAL ASSESSMENT                                          DYSPHAGIA SCREENING                          Difficulty with Secretions            Speech Slurred/Thick/Garbled            ABUSE/NEGLECT SCREENING   Physical Abuse/Neglect: Denies   Sexual Abuse: Denies   Sexual Abuse: Denies   Other Abuse/Issues: Denies          PRIMARY DECISION MAKER                                        ADVANCE CARE PLANNING (ACP) DOCUMENTS                  Suicide/Psychosocial Screening   Primary Diagnosis or Primary Complaint of an Emotional Behavior Disorder: No   Patient is Currently Experiencing Depression: No   Suicidal Ideation/Attempts: No   Homicidal Ideation/Attempts: No   Alcohol/Drug Intoxication: No   Hallucinations/Delusions: No   Pending, Active, or Temporary Detention Orders: No   Aggressive/Inappropriate Behavior: No      SAD PERSONS                                                  READMIT RISK TOOL                                  CARE MANAGEMENT INTERVENTIONS   Readmission Interview Completed: Yes   PCP Verified by CM: Yes (Dr. Dion BodyZhao)       Palliative Care Consult: No   Mode of Transport at Discharge: Self                   MyChart Signup: No   Discharge Durable Medical Equipment: No   Physical Therapy Consult: No   Occupational Therapy Consult: No   Speech Therapy Consult: No   Current Support Network: Relative's Home   Reason for Referral: DCP Rounds   History Provided By: Patient   Patient Orientation: Alert and Oriented   Cognition: Alert   Support System Response: Unavailable   Previous Living Arrangement: Lives with Family Independent       Prior Functional Level: Independent in ADLs/IADLs    Current Functional Level: Independent in ADLs/IADLs   Primary Language: English   Can patient return to prior living arrangement: Yes   Ability to make needs known:: Good   Family able to assist with home care needs:: Yes               Types of Needs Identified: Disease Management Education, Treatment Education, Emotional Support  Confirm Follow Up Transport: Family       Plan discussed with Pt/Family/Caregiver: Yes          DISCHARGE LOCATION   Discharge Placement: Home

## 2014-05-29 NOTE — Other (Signed)
Bedside and Verbal shift change report given to Augustin SchoolingGeraldine Mauhay RN (oncoming nurse) by Con MemosMichelle Evans RN (offgoing nurse). Report included the following information SBAR, Kardex, Intake/Output, MAR, Recent Results and Cardiac Rhythm NSR/Sinus Tachy.

## 2014-05-30 LAB — CULTURE, URINE
CULTURE RESULT: NO GROWTH
Culture result: NO GROWTH

## 2014-05-30 LAB — CBC WITH AUTOMATED DIFF
BASOPHILS: 0.7 % (ref 0–3)
EOSINOPHILS: 0.1 % (ref 0–5)
HCT: 35.2 % — ABNORMAL LOW (ref 37.0–50.0)
HGB: 11.6 gm/dl — ABNORMAL LOW (ref 13.0–17.2)
IMMATURE GRANULOCYTES: 0.2 % (ref 0.0–3.0)
LYMPHOCYTES: 34 % (ref 28–48)
MCH: 28.9 pg (ref 25.4–34.6)
MCHC: 33 gm/dl (ref 30.0–36.0)
MCV: 87.8 fL (ref 80.0–98.0)
MONOCYTES: 5.3 % (ref 1–13)
MPV: 12.3 fL — ABNORMAL HIGH (ref 6.0–10.0)
NEUTROPHILS: 59.7 % (ref 34–64)
NRBC: 0 (ref 0–0)
PLATELET: 275 10*3/uL (ref 140–450)
RBC: 4.01 M/uL (ref 3.60–5.20)
RDW-SD: 40.1 (ref 36.4–46.3)
WBC: 9.5 10*3/uL (ref 4.0–11.0)

## 2014-05-30 LAB — METABOLIC PANEL, COMPREHENSIVE
ALT (SGPT): 11 U/L — ABNORMAL LOW (ref 12–78)
AST (SGOT): 12 U/L — ABNORMAL LOW (ref 15–37)
Albumin: 3.4 gm/dl (ref 3.4–5.0)
Alk. phosphatase: 86 U/L (ref 45–117)
BUN: 8 mg/dl (ref 7–25)
Bilirubin, total: 0.9 mg/dl (ref 0.2–1.0)
CO2: 21 mEq/L (ref 21–32)
Calcium: 8.8 mg/dl (ref 8.5–10.1)
Chloride: 101 mEq/L (ref 98–107)
Creatinine: 0.9 mg/dl (ref 0.6–1.3)
GFR est AA: >60
GFR est non-AA: >60
Glucose: 299 mg/dl — ABNORMAL HIGH (ref 74–106)
Potassium: 4.1 mEq/L (ref 3.5–5.1)
Protein, total: 7.2 gm/dl (ref 6.4–8.2)
Sodium: 134 mEq/L — ABNORMAL LOW (ref 136–145)

## 2014-05-30 LAB — HEMOGLOBIN A1C W/O EAG: Hemoglobin A1c: 8.3 % — ABNORMAL HIGH (ref 4.8–6.0)

## 2014-05-30 LAB — MAGNESIUM: Magnesium: 1.6 mg/dl — ABNORMAL LOW (ref 1.8–2.4)

## 2014-05-30 LAB — GLUCOSE, POC
Glucose (POC): 224 mg/dL — ABNORMAL HIGH (ref 65–105)
Glucose (POC): 293 mg/dL — ABNORMAL HIGH (ref 65–105)
Glucose (POC): 282 mg/dL — ABNORMAL HIGH (ref 65–105)

## 2014-05-30 MED ORDER — INSULIN GLULISINE 100 UNIT/ML INJECTION
100 unit/mL | Freq: Three times a day (TID) | SUBCUTANEOUS | Status: DC
Start: 2014-05-30 — End: 2014-06-04
  Administered 2014-05-30 – 2014-06-03 (×2): via SUBCUTANEOUS

## 2014-05-30 MED ORDER — LORAZEPAM 2 MG/ML IJ SOLN
2 mg/mL | INTRAMUSCULAR | Status: AC | PRN
Start: 2014-05-30 — End: 2014-05-30
  Administered 2014-05-30: 08:00:00 via INTRAVENOUS

## 2014-05-30 MED ORDER — INSULIN LISPRO 100 UNIT/ML INJECTION
100 unit/mL | Freq: Four times a day (QID) | SUBCUTANEOUS | Status: DC
Start: 2014-05-30 — End: 2014-06-04
  Administered 2014-05-30 – 2014-06-03 (×7): via SUBCUTANEOUS

## 2014-05-30 MED ORDER — SODIUM CHLORIDE 0.9 % IJ SYRG
INTRAMUSCULAR | Status: AC
Start: 2014-05-30 — End: 2014-05-30
  Administered 2014-05-30: 15:00:00

## 2014-05-30 MED ORDER — MORPHINE 2 MG/ML INJECTION
2 mg/mL | INTRAMUSCULAR | Status: DC | PRN
Start: 2014-05-30 — End: 2014-06-01
  Administered 2014-05-30 – 2014-06-01 (×9): via INTRAVENOUS

## 2014-05-30 MED ORDER — MORPHINE 4 MG/ML SYRINGE
4 mg/mL | INTRAMUSCULAR | Status: AC | PRN
Start: 2014-05-30 — End: 2014-05-30

## 2014-05-30 MED ORDER — INSULIN GLARGINE 100 UNIT/ML INJECTION
100 unit/mL | Freq: Every evening | SUBCUTANEOUS | Status: DC
Start: 2014-05-30 — End: 2014-06-04
  Administered 2014-05-31 – 2014-06-04 (×4): via SUBCUTANEOUS

## 2014-05-30 MED ORDER — SODIUM CHLORIDE 0.9 % IJ SYRG
INTRAMUSCULAR | Status: AC
Start: 2014-05-30 — End: 2014-05-30
  Administered 2014-05-30: 13:00:00

## 2014-05-30 MED ORDER — SODIUM CHLORIDE 0.9 % IJ SYRG
INTRAMUSCULAR | Status: AC
Start: 2014-05-30 — End: 2014-05-30
  Administered 2014-05-30: 20:00:00

## 2014-05-30 MED FILL — MORPHINE 4 MG/ML SYRINGE: 4 mg/mL | INTRAMUSCULAR | Qty: 1

## 2014-05-30 MED FILL — DIPHENHYDRAMINE HCL 50 MG/ML IJ SOLN: 50 mg/mL | INTRAMUSCULAR | Qty: 1

## 2014-05-30 MED FILL — POTASSIUM CHLORIDE 10 MEQ/100 ML IV PIGGY BACK: 10 mEq/0 mL | INTRAVENOUS | Qty: 100

## 2014-05-30 MED FILL — BD POSIFLUSH NORMAL SALINE 0.9 % INJECTION SYRINGE: INTRAMUSCULAR | Qty: 10

## 2014-05-30 MED FILL — ONDANSETRON (PF) 4 MG/2 ML INJECTION: 4 mg/2 mL | INTRAMUSCULAR | Qty: 2

## 2014-05-30 MED FILL — NS WITH POTASSIUM CHLORIDE 20 MEQ/L IV: 20 mEq/L | INTRAVENOUS | Qty: 1000

## 2014-05-30 MED FILL — METOCLOPRAMIDE 5 MG/5 ML ORAL SOLN: 5 mg/ mL | ORAL | Qty: 10

## 2014-05-30 MED FILL — CITALOPRAM 20 MG TAB: 20 mg | ORAL | Qty: 1

## 2014-05-30 MED FILL — LISINOPRIL 20 MG TAB: 20 mg | ORAL | Qty: 1

## 2014-05-30 MED FILL — MORPHINE 2 MG/ML INJECTION: 2 mg/mL | INTRAMUSCULAR | Qty: 1

## 2014-05-30 MED FILL — LORAZEPAM 2 MG/ML IJ SOLN: 2 mg/mL | INTRAMUSCULAR | Qty: 1

## 2014-05-30 NOTE — Other (Signed)
Bedside and Verbal shift change report given to GeraldineAugustin Schooling Mauhay RN (oncoming nurse) by Con MemosMichelle Evans RN  (offgoing nurse). Report included the following information SBAR, Kardex, Intake/Output, MAR, Recent Results, Med Rec Status and Cardiac Rhythm Sinus Tachy.

## 2014-05-30 NOTE — Progress Notes (Signed)
ADULT MALNUTRITION GUIDELINES    Patient is a 29 y.o. female, admitted on 05/29/2014 with a diagnosis of intractable vomiting, hypokalmemia.  Nutrition assessment was completed by RD and the patient was found to meet the following malnutrition criteria established by ASPEN/AND:    Adult Malnutrition Guidelines:  MALNUTRITION OF MODERATE DEGREE - NON-SEVERE MALNUTRITION IN THE CONTEXT OF CHRONIC ILLNESS  Muscle Mass: Mild depletion at the temples and clavicle  Wt loss:  7% x 6 weeks    Intervention:  Encourage PO intake. Advance to DB1800 diet as tolerated.    Please document MALNUTRITION in Problem List if in agreement.        NUTRITION INITIAL EVALUATION    NUTRITION ASSESSMENT:       Reason for assessment: diagnosis screen upon glyc eval    Admitting diagnosis: intractable vomiting, hypokalmemia      PMH:   Past Medical History   Diagnosis Date   ??? Diabetes (HCC)    ??? Gastroparesis    ??? Gastroparesis    ??? Hypertension         Anthropometrics:  Height: Ht Readings from Last 3 Encounters:   05/29/14 5\' 6"  (1.676 m)   05/27/14 5\' 6"  (1.676 m)   05/25/14 5\' 6"  (1.676 m)     Weight: Wt Readings from Last 3 Encounters:   05/29/14 50.576 kg (111 lb 8 oz)   05/27/14 53.071 kg (117 lb)   05/25/14 53.524 kg (118 lb)     IBW: 59 kg    % IBW: 86% - mild wt deficit    BMI: Body mass index is 18.01 kg/(m^2).    Wt change: -4 kg (7% BW) x 6 weeks - severe wt loss   Diet and intake history:  Current diet order: DIET CLEAR LIQUID    Food allergies: NKA    Diet/intake history: none x 3 days since N/V; before that, pt reports only string beans    Current appetite/PO intake: poor due to nausea/pt was NPO upon vist     Assessment of current MNT: CL inadequate to meet needs. Advance diet as able and tolerated    Cultural, religious, and ethnic food preferences identified: none    Physical Assessment:  GI symptoms: N/V, abdominal pain    Chewing/swallowing issues: none    Skin integrity: intact     Muscle wasting: mild at the temples and clavicle    Fluid accumulation: none    Mental status: WNL     Intake and output:    Intake/Output Summary (Last 24 hours) at 05/30/14 1217  Last data filed at 05/30/14 1040   Gross per 24 hour   Intake 1898.34 ml   Output      0 ml   Net 1898.34 ml       Estimated daily nutrition intake needs:  1636 - 1888 kcals (mifflin x 1.3-1.5)    61 - 71 g protein (1.2-1.4 g/kg BW)    1530 ml fluid (30 mL/kg BW)     Living situation: Patient lives at home with family/caregiver    Current nutrition-related medications: zofran    Pertinent nutrition-related labs: glucose:  299, 225; HgbA1C: 8.3%    Does patient meet malnutrition criteria: Chronic, moderate PCM     NUTRITION DIAGNOSIS:     1. Malnutrition related to inadequate energy intake as evidenced by physical assessment and wt loss.    2. Altered nutrition-related laboratory value (HgbA1C) related to inconsistent compliance with DM diet as evidenced by HgbA1C of  8.3%.      NUTRITION INTERVENTION:     Recommended diet: advance to DB1800 as medically able      NUTRITION MONITORING AND EVALUATION:     Nutrition level of care: moderate    Nutrition monitoring: PO intake, nutrition-related labs, medical change of condition     Nutrition goals: PO intake >50% meals and well tolerated, nutrition-related labs WNL       NUTRITION glycemic control evaluation    Education history: previously educated    Current diet order: DIET CLEAR LIQUID    Diet history: DM diet    Compliance history:  inconsistent    Biochemical data:      Blood glucose at hospital: 299, 225       HgbA1c:   Lab Results   Component Value Date/Time    HEMOGLOBIN A1C 8.3 05/29/2014 09:12 AM    HEMOGLOBIN A1C 8.9 05/03/2014 12:35 AM    HEMOGLOBIN A1C 8.7 04/06/2014 03:45 AM     Current DM medications: apidra    Home DM medications: lantus    _____________________________________________________________________     Diet instructed on: gastroparesis, pt educated multiple times on DM diet    Education modifications/barriers to learning: pt in pain    Method of Instruction: verbal, written    Patient comprehension: fair    Expected compliance: fair; also encouraged pt to keep a food journal to help identify food items that are or aren't tolerable     Della Goo, RD  05/30/2014  Pager:  [phone]

## 2014-05-30 NOTE — Progress Notes (Signed)
Problem: Falls - Risk of  Goal: *Absence of falls  Outcome: Progressing Towards Goal  Patient remain free of falls per shift. Encourage patient to call for assistance at all times. Will continue to monitor.

## 2014-05-30 NOTE — Progress Notes (Signed)
Medical Progress Note      NAME: [name] [name]   DOB:  1985-08-14  MRN:             [id number]    Date/Time: 05/30/2014  1:38 PM      Assessment:     1. Intractable Nausea and vomiting  2. Hypokalemia, repleted  3. Gastroparesis  4. DM-I, uncontrolled    Plan:     ?? Continue IVF  ?? IV zofran, reglan  ?? Monitor blood glucose  ?? Change lantus 20 units QHS, SSI    Subjective:     Still has nausea and vomiting, abdominal pain    Objective:     Vitals:      Last 24hrs VS reviewed since prior progress note. Most recent are:  Visit Vitals   Item Reading   ??? BP 161/98 mmHg   ??? Pulse 100   ??? Temp 97.8 ??F (36.6 ??C)   ??? Resp 18   ??? Ht $Remo'5\' 6"'ypkuj$  (1.676 m)   ??? Wt 50.576 kg (111 lb 8 oz)   ??? BMI 18.01 kg/m2   ??? SpO2 100%   ??? Breastfeeding No     SpO2 Readings from Last 6 Encounters:   05/30/14 100% 05/27/14 100%   05/25/14 98%   05/12/14 100%   05/07/14 100%   05/05/14 100%          Intake/Output Summary (Last 24 hours) at 05/30/14 1338  Last data filed at 05/30/14 1040   Gross per 24 hour   Intake 1898.34 ml   Output      0 ml   Net 1898.34 ml        Exam:     General:   Not in acute distress  HEENT: PERRLA, Neck Supple,  No JVD  Respiratory:   CTA bilaterally-no wheezes, rales, rhonchi, or crackles  Cardiac:  Regular Rate and Rythmn  - no murmurs, rubs or gallops  Abdominal:  Soft, non-tender, non-distended, positive bowel sounds  Extremities:  No cyanosis, or edema.  Skin: No rash  Neurological:  No focal neurological deficits    Medication:     Current Medications Reviewed    Current facility-administered medications:   ???  morphine injection 2 mg, 2 mg, IntraVENous, Q4H PRN, Stanton Kidney, MD, 2 mg at 05/30/14 1040  ???  citalopram (CELEXA) tablet 20 mg, 20 mg, Oral, DAILY, Stanton Kidney, MD, 20 mg at 05/30/14 3382  ???  insulin glulisine (APIDRA) injection 20 Units, 20 Units, SubCUTAneous, TIDAC, Stanton Kidney, MD, Stopped at 05/29/14 1630   ???  lisinopril (PRINIVIL, ZESTRIL) tablet 20 mg, 20 mg, Oral, DAILY, Stanton Kidney, MD, 20 mg at 05/30/14 5053  ???  metoclopramide HCl (REGLAN) syrup 5 mg, 5 mg, Oral, TIDAC, Stanton Kidney, MD, Stopped at 05/30/14 1130  ???  sodium chloride (NS) flush 5-10 mL, 5-10 mL, IntraVENous, Q8H, Stanton Kidney, MD, Stopped at 05/29/14 2148  ???  sodium chloride (NS) flush 5-10 mL, 5-10 mL, IntraVENous, PRN, Stanton Kidney, MD  ???  naloxone (NARCAN) injection 0.1 mg, 0.1 mg, IntraVENous, PRN, Stanton Kidney, MD  ???  acetaminophen (TYLENOL) tablet 650 mg, 650 mg, Oral, Q6H PRN, Stanton Kidney, MD  ???  enoxaparin (LOVENOX) injection 40 mg, 40 mg, SubCUTAneous, Q24H, Stanton Kidney, MD, 40 mg at 05/29/14 1700  ???  ondansetron (ZOFRAN) injection 4 mg, 4 mg, IntraVENous, Q4H PRN, Stanton Kidney, MD, 4 mg at 05/30/14 0243  ???  0.9% sodium chloride with KCl 20 mEq/L infusion, , IntraVENous, CONTINUOUS, Stanton Kidney, MD, Last Rate: 100 mL/hr at 05/30/14 0650  ???  diphenhydrAMINE (BENADRYL) injection 25 mg, 25 mg, IntraVENous, Q6H PRN, Stanton Kidney, MD, 25 mg at 05/30/14 4782    Lab:     Lab Data Reviewed: (see below)  Recent Results (from the past 24 hour(s))   GLUCOSE, POC    Collection Time: 05/29/14  4:30 PM   Result Value Ref Range    Glucose (POC) 225 (H) 65 - 105 mg/dL   GLUCOSE, POC    Collection Time: 05/29/14  9:33 PM   Result Value Ref Range    Glucose (POC) 224 (H) 65 - 105 mg/dL   CBC WITH AUTOMATED DIFF    Collection Time: 05/30/14  3:17 AM   Result Value Ref Range    WBC 9.5 4.0 - 11.0 1000/mm3    RBC 4.01 3.60 - 5.20 M/uL    HGB 11.6 (L) 13.0 - 17.2 gm/dl    HCT 35.2 (L) 37.0 - 50.0 %    MCV 87.8 80.0 - 98.0 fL    MCH 28.9 25.4 - 34.6 pg    MCHC 33.0 30.0 - 36.0 gm/dl    PLATELET 275 140 - 450 1000/mm3    MPV 12.3 (H) 6.0 - 10.0 fL    RDW-SD 40.1 36.4 - 46.3      NRBC 0 0 - 0      IMMATURE GRANULOCYTES 0.2 0.0 - 3.0 %    NEUTROPHILS 59.7 34 - 64 %    LYMPHOCYTES 34.0 28 - 48 %    MONOCYTES 5.3 1 - 13 %     EOSINOPHILS 0.1 0 - 5 %    BASOPHILS 0.7 0 - 3 %   METABOLIC PANEL, COMPREHENSIVE    Collection Time: 05/30/14  3:17 AM   Result Value Ref Range    Sodium 134 (L) 136 - 145 mEq/L    Potassium 4.1 3.5 - 5.1 mEq/L    Chloride 101 98 - 107 mEq/L    CO2 21 21 - 32 mEq/L    Glucose 299 (H) 74 - 106 mg/dl    BUN 8 7 - 25 mg/dl    Creatinine 0.9 0.6 - 1.3 mg/dl    GFR est AA >60.0      GFR est non-AA >60      Calcium 8.8 8.5 - 10.1 mg/dl    AST 12 (L) 15 - 37 U/L    ALT 11 (L) 12 - 78 U/L    Alk. phosphatase 86 45 - 117 U/L    Bilirubin, total 0.9 0.2 - 1.0 mg/dl    Protein, total 7.2 6.4 - 8.2 gm/dl    Albumin 3.4 3.4 - 5.0 gm/dl   MAGNESIUM    Collection Time: 05/30/14  3:17 AM   Result Value Ref Range    Magnesium 1.6 (L) 1.8 - 2.4 mg/dl   GLUCOSE, POC    Collection Time: 05/30/14 11:38 AM   Result Value Ref Range    Glucose (POC) 293 (H) 65 - 105 mg/dL       No results found.    Active Problems:    * No active hospital problems. *      Prophylaxis:  Lovenox  Coumadin  Hep SQ  SCD???s  H2B/PPI    Disposition:  Home w/ Family   HH PT,OT,RN   SNF/LTC   SAH/Rehab    Care Plan discussed with:  Patient   Family    Care Manager  ED Doc   Specialist :       _______________________________________________________________________        Attending Physician: Stanton Kidney, MD      [facility]

## 2014-05-31 LAB — CBC WITH AUTOMATED DIFF
BASOPHILS: 0.6 % (ref 0–3)
EOSINOPHILS: 0 % (ref 0–5)
HCT: 35.3 % — ABNORMAL LOW (ref 37.0–50.0)
HGB: 11.7 gm/dl — ABNORMAL LOW (ref 13.0–17.2)
IMMATURE GRANULOCYTES: 0.3 % (ref 0.0–3.0)
LYMPHOCYTES: 23.9 % — ABNORMAL LOW (ref 28–48)
MCH: 29.1 pg (ref 25.4–34.6)
MCHC: 33.1 gm/dl (ref 30.0–36.0)
MCV: 87.8 fL (ref 80.0–98.0)
MONOCYTES: 4.3 % (ref 1–13)
MPV: 11.5 fL — ABNORMAL HIGH (ref 6.0–10.0)
NEUTROPHILS: 70.9 % — ABNORMAL HIGH (ref 34–64)
NRBC: 0 (ref 0–0)
PLATELET: 292 10*3/uL (ref 140–450)
RBC: 4.02 M/uL (ref 3.60–5.20)
RDW-SD: 41.1 (ref 36.4–46.3)
WBC: 12.2 10*3/uL — ABNORMAL HIGH (ref 4.0–11.0)

## 2014-05-31 LAB — METABOLIC PANEL, COMPREHENSIVE
ALT (SGPT): 15 U/L (ref 12–78)
AST (SGOT): 13 U/L — ABNORMAL LOW (ref 15–37)
Albumin: 3.5 gm/dl (ref 3.4–5.0)
Alk. phosphatase: 90 U/L (ref 45–117)
BUN: 11 mg/dl (ref 7–25)
Bilirubin, total: 0.7 mg/dl (ref 0.2–1.0)
CO2: 15 mEq/L — ABNORMAL LOW (ref 21–32)
Calcium: 8.7 mg/dl (ref 8.5–10.1)
Chloride: 103 mEq/L (ref 98–107)
Creatinine: 1.1 mg/dl (ref 0.6–1.3)
GFR est AA: >60
GFR est non-AA: >60
Glucose: 279 mg/dl — ABNORMAL HIGH (ref 74–106)
Potassium: 4.5 mEq/L (ref 3.5–5.1)
Protein, total: 7.6 gm/dl (ref 6.4–8.2)
Sodium: 133 mEq/L — ABNORMAL LOW (ref 136–145)

## 2014-05-31 LAB — GLUCOSE, POC
Glucose (POC): 247 mg/dL — ABNORMAL HIGH (ref 65–105)
Glucose (POC): 176 mg/dL — ABNORMAL HIGH (ref 65–105)
Glucose (POC): 92 mg/dL (ref 65–105)
Glucose (POC): 89 mg/dL (ref 65–105)

## 2014-05-31 MED FILL — NS WITH POTASSIUM CHLORIDE 20 MEQ/L IV: 20 mEq/L | INTRAVENOUS | Qty: 1000

## 2014-05-31 MED FILL — DIPHENHYDRAMINE HCL 50 MG/ML IJ SOLN: 50 mg/mL | INTRAMUSCULAR | Qty: 1

## 2014-05-31 MED FILL — MORPHINE 2 MG/ML INJECTION: 2 mg/mL | INTRAMUSCULAR | Qty: 1

## 2014-05-31 MED FILL — CITALOPRAM 20 MG TAB: 20 mg | ORAL | Qty: 1

## 2014-05-31 MED FILL — METOCLOPRAMIDE 5 MG/5 ML ORAL SOLN: 5 mg/ mL | ORAL | Qty: 10

## 2014-05-31 MED FILL — LISINOPRIL 20 MG TAB: 20 mg | ORAL | Qty: 1

## 2014-05-31 NOTE — Progress Notes (Signed)
Medical Progress Note      NAME: [name] [name]   DOB:  20-Jun-1985  MRN:             [id number]    Date/Time: 05/31/2014  2:26 PM      Assessment:     1. Intractable Nausea and vomiting, still complaining  2. Hypokalemia, repleted  3. Gastroparesis  4. DM-I, uncontrolled    Plan:     ?? Patient still c/o N/V  ?? Will try to advance her diet to full liquid today  ?? Continue IVF, IV zofran, reglan  ?? Monitor blood glucose  ?? Continue  lantus 20 units QHS, SSI    Subjective:     Still has nausea and vomiting, no fever or chills   Objective:     Vitals:      Last 24hrs VS reviewed since prior progress note. Most recent are:  Visit Vitals   Item Reading   ??? BP 131/89 mmHg   ??? Pulse 105   ??? Temp 98.1 ??F (36.7 ??C)   ??? Resp 18   ??? Ht $Remo'5\' 6"'dYkNt$  (1.676 m)   ??? Wt 50.576 kg (111 lb 8 oz)   ??? BMI 18.01 kg/m2   ??? SpO2 100%   ??? Breastfeeding No     SpO2 Readings from Last 6 Encounters:   05/31/14 100%   05/27/14 100%   05/25/14 98%   05/12/14 100%   05/07/14 100%   05/05/14 100%            Intake/Output Summary (Last 24 hours) at 05/31/14 1427  Last data filed at 05/31/14 1318   Gross per 24 hour   Intake 2316.66 ml   Output      0 ml   Net 2316.66 ml        Exam:     General:   Not in acute distress  HEENT: PERRLA, Neck Supple,  No JVD  Respiratory:   CTA bilaterally-no wheezes, rales, rhonchi, or crackles  Cardiac:  Regular Rate and Rythmn  - no murmurs, rubs or gallops  Abdominal:  Soft, non-tender, non-distended, positive bowel sounds  Extremities:  No cyanosis, or edema.  Skin: No rash  Neurological:  No focal neurological deficits    Medication:     Current Medications Reviewed    Current facility-administered medications:   ???  morphine injection 2 mg, 2 mg, IntraVENous, Q4H PRN, Stanton Kidney, MD, 2 mg at 05/31/14 0947  ???  insulin glargine (LANTUS) injection 20 Units, 20 Units, SubCUTAneous, QHS, Stanton Kidney, MD, 20 Units at 05/30/14 2216   ???  insulin glulisine (APIDRA) injection 5 Units, 5 Units, SubCUTAneous, TIDAC, Stanton Kidney, MD, Stopped at 05/31/14 0730  ???  insulin lispro (HUMALOG) injection, , SubCUTAneous, AC&HS, Stanton Kidney, MD, Stopped at 05/31/14 1130  ???  citalopram (CELEXA) tablet 20 mg, 20 mg, Oral, DAILY, Stanton Kidney, MD, 20 mg at 05/31/14 0948  ???  lisinopril (PRINIVIL, ZESTRIL) tablet 20 mg, 20 mg, Oral, DAILY, Stanton Kidney, MD, 20 mg at 05/31/14 0948  ???  metoclopramide HCl (REGLAN) syrup 5 mg, 5 mg, Oral, TIDAC, Stanton Kidney, MD, Stopped at 05/31/14 1130  ???  sodium chloride (NS) flush 5-10 mL, 5-10 mL, IntraVENous, Q8H, Stanton Kidney, MD, 10 mL at 05/31/14 0954  ???  sodium chloride (NS) flush 5-10 mL, 5-10 mL, IntraVENous, PRN, Stanton Kidney, MD, 10 mL at 05/30/14 1516  ???  naloxone (NARCAN) injection  0.1 mg, 0.1 mg, IntraVENous, PRN, Stanton Kidney, MD  ???  acetaminophen (TYLENOL) tablet 650 mg, 650 mg, Oral, Q6H PRN, Stanton Kidney, MD  ???  enoxaparin (LOVENOX) injection 40 mg, 40 mg, SubCUTAneous, Q24H, Stanton Kidney, MD, 40 mg at 05/29/14 1700  ???  ondansetron (ZOFRAN) injection 4 mg, 4 mg, IntraVENous, Q4H PRN, Stanton Kidney, MD, 4 mg at 05/30/14 0243  ???  0.9% sodium chloride with KCl 20 mEq/L infusion, , IntraVENous, CONTINUOUS, Stanton Kidney, MD, Last Rate: 100 mL/hr at 05/31/14 0093  ???  diphenhydrAMINE (BENADRYL) injection 25 mg, 25 mg, IntraVENous, Q6H PRN, Stanton Kidney, MD, 25 mg at 05/31/14 0948    Lab:     Lab Data Reviewed: (see below)  Recent Results (from the past 24 hour(s))   GLUCOSE, POC    Collection Time: 05/30/14  4:10 PM   Result Value Ref Range    Glucose (POC) 282 (H) 65 - 105 mg/dL   GLUCOSE, POC    Collection Time: 05/30/14  9:06 PM   Result Value Ref Range    Glucose (POC) 247 (H) 65 - 105 mg/dL   CBC WITH AUTOMATED DIFF    Collection Time: 05/31/14  3:03 AM   Result Value Ref Range    WBC 12.2 (H) 4.0 - 11.0 1000/mm3    RBC 4.02 3.60 - 5.20 M/uL    HGB 11.7 (L) 13.0 - 17.2 gm/dl     HCT 35.3 (L) 37.0 - 50.0 %    MCV 87.8 80.0 - 98.0 fL    MCH 29.1 25.4 - 34.6 pg    MCHC 33.1 30.0 - 36.0 gm/dl    PLATELET 292 140 - 450 1000/mm3    MPV 11.5 (H) 6.0 - 10.0 fL    RDW-SD 41.1 36.4 - 46.3      NRBC 0 0 - 0      IMMATURE GRANULOCYTES 0.3 0.0 - 3.0 %    NEUTROPHILS 70.9 (H) 34 - 64 %    LYMPHOCYTES 23.9 (L) 28 - 48 %    MONOCYTES 4.3 1 - 13 %    EOSINOPHILS 0.0 0 - 5 %    BASOPHILS 0.6 0 - 3 %   METABOLIC PANEL, COMPREHENSIVE    Collection Time: 05/31/14  3:03 AM   Result Value Ref Range    Sodium 133 (L) 136 - 145 mEq/L    Potassium 4.5 3.5 - 5.1 mEq/L    Chloride 103 98 - 107 mEq/L    CO2 15 (L) 21 - 32 mEq/L    Glucose 279 (H) 74 - 106 mg/dl    BUN 11 7 - 25 mg/dl    Creatinine 1.1 0.6 - 1.3 mg/dl    GFR est AA >60.0      GFR est non-AA >60      Calcium 8.7 8.5 - 10.1 mg/dl    AST 13 (L) 15 - 37 U/L    ALT 15 12 - 78 U/L    Alk. phosphatase 90 45 - 117 U/L    Bilirubin, total 0.7 0.2 - 1.0 mg/dl    Protein, total 7.6 6.4 - 8.2 gm/dl    Albumin 3.5 3.4 - 5.0 gm/dl   GLUCOSE, POC    Collection Time: 05/31/14  8:24 AM   Result Value Ref Range    Glucose (POC) 176 (H) 65 - 105 mg/dL   GLUCOSE, POC    Collection Time: 05/31/14 12:10 PM   Result Value Ref  Range    Glucose (POC) 92 65 - 105 mg/dL       No results found.    Active Problems:    * No active hospital problems. *      Prophylaxis:  Lovenox  Coumadin  Hep SQ  SCD???s  H2B/PPI    Disposition:  Home w/ Family   HH PT,OT,RN   SNF/LTC   SAH/Rehab    Care Plan discussed with:    Patient   Family    Care Manager  ED Doc   Specialist :       _______________________________________________________________________        Attending Physician: Stanton Kidney, MD      [facility]

## 2014-05-31 NOTE — Progress Notes (Signed)
Problem: Diabetes Self-Management  Goal: *Developing strategies to promote health/change behavior  Outcome: Progressing Towards Goal  Patient has appointment with MD in [place] next week to evaluate her diabetes management, states understanding of adherence to diabetic diet and avoidance of detrimental habits like smoking and drinking

## 2014-05-31 NOTE — Progress Notes (Signed)
Problem: Falls - Risk of  Goal: *Absence of falls  Outcome: Progressing Towards Goal  Patient remain free from falls. Fall precaution in maintained. Will continue to monitor.

## 2014-06-01 LAB — CBC WITH AUTOMATED DIFF
BASOPHILS: 0.4 % (ref 0–3)
EOSINOPHILS: 0 % (ref 0–5)
HCT: 33.7 % — ABNORMAL LOW (ref 37.0–50.0)
HGB: 11.5 gm/dl — ABNORMAL LOW (ref 13.0–17.2)
IMMATURE GRANULOCYTES: 0.4 % (ref 0.0–3.0)
LYMPHOCYTES: 9.1 % — ABNORMAL LOW (ref 28–48)
MCH: 29.3 pg (ref 25.4–34.6)
MCHC: 34.1 gm/dl (ref 30.0–36.0)
MCV: 85.8 fL (ref 80.0–98.0)
MONOCYTES: 2.6 % (ref 1–13)
MPV: 12.2 fL — ABNORMAL HIGH (ref 6.0–10.0)
NEUTROPHILS: 87.5 % — ABNORMAL HIGH (ref 34–64)
NRBC: 0 (ref 0–0)
PLATELET: 283 10*3/uL (ref 140–450)
RBC: 3.93 M/uL (ref 3.60–5.20)
RDW-SD: 39.4 (ref 36.4–46.3)
WBC: 14.9 10*3/uL — ABNORMAL HIGH (ref 4.0–11.0)

## 2014-06-01 LAB — METABOLIC PANEL, COMPREHENSIVE
ALT (SGPT): 13 U/L (ref 12–78)
AST (SGOT): 12 U/L — ABNORMAL LOW (ref 15–37)
Albumin: 3.6 gm/dl (ref 3.4–5.0)
Alk. phosphatase: 83 U/L (ref 45–117)
BUN: 7 mg/dl (ref 7–25)
Bilirubin, total: 0.8 mg/dl (ref 0.2–1.0)
CO2: 17 mEq/L — ABNORMAL LOW (ref 21–32)
Calcium: 8.9 mg/dl (ref 8.5–10.1)
Chloride: 97 mEq/L — ABNORMAL LOW (ref 98–107)
Creatinine: 1 mg/dl (ref 0.6–1.3)
GFR est AA: >60
GFR est non-AA: >60
Glucose: 349 mg/dl — ABNORMAL HIGH (ref 74–106)
Potassium: 3.5 mEq/L (ref 3.5–5.1)
Protein, total: 7.2 gm/dl (ref 6.4–8.2)
Sodium: 130 mEq/L — ABNORMAL LOW (ref 136–145)

## 2014-06-01 LAB — GLUCOSE, POC
Glucose (POC): 100 mg/dL (ref 65–105)
Glucose (POC): 329 mg/dL — ABNORMAL HIGH (ref 65–105)
Glucose (POC): 83 mg/dL (ref 65–105)
Glucose (POC): 92 mg/dL (ref 65–105)

## 2014-06-01 LAB — MAGNESIUM: Magnesium: 1.4 mg/dl — ABNORMAL LOW (ref 1.8–2.4)

## 2014-06-01 MED ORDER — POTASSIUM CHLORIDE SR 10 MEQ TAB
10 mEq | ORAL | Status: AC
Start: 2014-06-01 — End: 2014-06-01
  Administered 2014-06-01: 21:00:00 via ORAL

## 2014-06-01 MED ORDER — HYDRALAZINE 20 MG/ML IJ SOLN
20 mg/mL | Freq: Four times a day (QID) | INTRAMUSCULAR | Status: DC | PRN
Start: 2014-06-01 — End: 2014-06-04
  Administered 2014-06-01 – 2014-06-04 (×3): via INTRAVENOUS

## 2014-06-01 MED ORDER — PROMETHAZINE IN NS 12.5 MG/50 ML IV PIGGY BAG
12.5 mg/50 ml | Freq: Four times a day (QID) | INTRAVENOUS | Status: DC | PRN
Start: 2014-06-01 — End: 2014-06-04
  Administered 2014-06-02 – 2014-06-03 (×2): via INTRAVENOUS

## 2014-06-01 MED ORDER — MORPHINE 2 MG/ML INJECTION
2 mg/mL | INTRAMUSCULAR | Status: DC | PRN
Start: 2014-06-01 — End: 2014-06-02
  Administered 2014-06-01 – 2014-06-02 (×5): via INTRAVENOUS

## 2014-06-01 MED ORDER — MAGNESIUM SULFATE 2 GRAM/50 ML IVPB
2 gram/50 mL (4 %) | Freq: Once | INTRAVENOUS | Status: AC
Start: 2014-06-01 — End: 2014-06-01
  Administered 2014-06-01: 21:00:00 via INTRAVENOUS

## 2014-06-01 MED FILL — POTASSIUM CHLORIDE SR 10 MEQ TAB: 10 mEq | ORAL | Qty: 2

## 2014-06-01 MED FILL — METOCLOPRAMIDE 5 MG/5 ML ORAL SOLN: 5 mg/ mL | ORAL | Qty: 10

## 2014-06-01 MED FILL — PROMETHAZINE IN NS 12.5 MG/50 ML IV PIGGY BAG: 12.5 mg/50 ml | INTRAVENOUS | Qty: 50

## 2014-06-01 MED FILL — INSULIN GLARGINE 100 UNIT/ML INJECTION: 100 unit/mL | SUBCUTANEOUS | Qty: 1

## 2014-06-01 MED FILL — ONDANSETRON (PF) 4 MG/2 ML INJECTION: 4 mg/2 mL | INTRAMUSCULAR | Qty: 2

## 2014-06-01 MED FILL — MORPHINE 2 MG/ML INJECTION: 2 mg/mL | INTRAMUSCULAR | Qty: 1

## 2014-06-01 MED FILL — DIPHENHYDRAMINE HCL 50 MG/ML IJ SOLN: 50 mg/mL | INTRAMUSCULAR | Qty: 1

## 2014-06-01 MED FILL — HYDRALAZINE 20 MG/ML IJ SOLN: 20 mg/mL | INTRAMUSCULAR | Qty: 1

## 2014-06-01 MED FILL — NS WITH POTASSIUM CHLORIDE 20 MEQ/L IV: 20 mEq/L | INTRAVENOUS | Qty: 1000

## 2014-06-01 MED FILL — LISINOPRIL 20 MG TAB: 20 mg | ORAL | Qty: 1

## 2014-06-01 MED FILL — CITALOPRAM 20 MG TAB: 20 mg | ORAL | Qty: 1

## 2014-06-01 MED FILL — BD POSIFLUSH NORMAL SALINE 0.9 % INJECTION SYRINGE: INTRAMUSCULAR | Qty: 10

## 2014-06-01 MED FILL — MAGNESIUM SULFATE 2 GRAM/50 ML IVPB: 2 gram/50 mL (4 %) | INTRAVENOUS | Qty: 50

## 2014-06-01 NOTE — Progress Notes (Addendum)
Medical Progress Note      NAME: [name] [name]   DOB:  May 02, 1985  MRN:             [id number]    Date/Time: 06/01/2014  3:18 PM      Assessment:     1. Intractable Nausea and vomiting, still complaining  2. Hypokalemia, repleted  3. Gastroparesis  4. DM-I, uncontrolled  5. Hypomagnesemia    Plan:     ?? Patient not tolerating her diet  ?? D/w patient and decreasing her pain meds, planning to switch to Po in am  ?? May try to advance diet if she tolerates.   ?? Replete K and Mag  ?? Continue IVF, IV zofran, reglan  ?? Monitor blood glucose  ?? Continue  lantus and humalog     Subjective:     Not tolerating PO, Still has nausea and vomiting,    Objective:     Vitals:      Last 24hrs VS reviewed since prior progress note. Most recent are:  Visit Vitals   Item Reading   ??? BP 132/89 mmHg   ??? Pulse 118   ??? Temp 97.4 ??F (36.3 ??C)   ??? Resp 15   ??? Ht 5\' 6"  (1.676 m)   ??? Wt 50.576 kg (111 lb 8 oz)   ??? BMI 18.01 kg/m2   ??? SpO2 100%   ??? Breastfeeding No     SpO2 Readings from Last 6 Encounters:   06/01/14 100%   05/27/14 100%   05/25/14 98%   05/12/14 100%   05/07/14 100%   05/05/14 100%            Intake/Output Summary (Last 24 hours) at 06/01/14 1518  Last data filed at 05/31/14 1833   Gross per 24 hour   Intake 1626.67 ml   Output      0 ml   Net 1626.67 ml        Exam:     General:   Not in acute distress  HEENT: PERRLA, Neck Supple,  No JVD  Respiratory:   CTA bilaterally-no wheezes, rales, rhonchi, or crackles  Cardiac:  Regular Rate and Rythmn  - no murmurs, rubs or gallops  Abdominal:  Soft, non-tender, non-distended, positive bowel sounds  Extremities:  No cyanosis, or edema.  Skin: No rash  Neurological:  No focal neurological deficits    Medication:     Current Medications Reviewed    Current facility-administered medications:   ???  hydrALAZINE (APRESOLINE) 20 mg/mL injection 10 mg, 10 mg, IntraVENous, Q6H PRN, 07/31/14, MD, 10 mg at 06/01/14 0125   ???  morphine injection 1 mg, 1 mg, IntraVENous, Q4H PRN, 08/01/14, MD, 1 mg at 06/01/14 1236  ???  promethazine (PHENERGAN) 12.5 mg in NS 50 mL IVPB, 12.5 mg, IntraVENous, Q6H PRN, 08/01/14, MD  ???  magnesium sulfate 2 g/50 ml IVPB (premix or compounded), 2 g, IntraVENous, ONCE, Theodis Aguas, MD  ???  potassium chloride SR (KLOR-CON 10) tablet 20 mEq, 20 mEq, Oral, NOW, Theodis Aguas, MD  ???  insulin glargine (LANTUS) injection 20 Units, 20 Units, SubCUTAneous, QHS, Theodis Aguas, MD, 20 Units at 05/30/14 2216  ???  insulin glulisine (APIDRA) injection 5 Units, 5 Units, SubCUTAneous, TIDAC, 2217, MD, Stopped at 05/31/14 0730  ???  insulin lispro (HUMALOG) injection, , SubCUTAneous, AC&HS, 07/31/14, MD, Stopped at 06/01/14 1130  ???  citalopram (CELEXA) tablet 20  mg, 20 mg, Oral, DAILY, Stanton Kidney, MD, 20 mg at 06/01/14 0746  ???  lisinopril (PRINIVIL, ZESTRIL) tablet 20 mg, 20 mg, Oral, DAILY, Stanton Kidney, MD, 20 mg at 06/01/14 0747  ???  metoclopramide HCl (REGLAN) syrup 5 mg, 5 mg, Oral, TIDAC, Stanton Kidney, MD, 5 mg at 06/01/14 1235  ???  sodium chloride (NS) flush 5-10 mL, 5-10 mL, IntraVENous, Q8H, Stanton Kidney, MD, 10 mL at 06/01/14 1235  ???  sodium chloride (NS) flush 5-10 mL, 5-10 mL, IntraVENous, PRN, Stanton Kidney, MD, 10 mL at 05/30/14 1516  ???  naloxone (NARCAN) injection 0.1 mg, 0.1 mg, IntraVENous, PRN, Stanton Kidney, MD  ???  acetaminophen (TYLENOL) tablet 650 mg, 650 mg, Oral, Q6H PRN, Stanton Kidney, MD  ???  enoxaparin (LOVENOX) injection 40 mg, 40 mg, SubCUTAneous, Q24H, Stanton Kidney, MD, Stopped at 05/31/14 1700  ???  ondansetron (ZOFRAN) injection 4 mg, 4 mg, IntraVENous, Q4H PRN, Stanton Kidney, MD, 4 mg at 06/01/14 0135  ???  0.9% sodium chloride with KCl 20 mEq/L infusion, , IntraVENous, CONTINUOUS, Stanton Kidney, MD, Last Rate: 100 mL/hr at 06/01/14 0758  ???  diphenhydrAMINE (BENADRYL) injection 25 mg, 25 mg, IntraVENous, Q6H PRN, Stanton Kidney, MD, 25 mg at 06/01/14 1236     Lab:     Lab Data Reviewed: (see below)  Recent Results (from the past 24 hour(s))   GLUCOSE, POC    Collection Time: 05/31/14  4:23 PM   Result Value Ref Range    Glucose (POC) 89 65 - 105 mg/dL   GLUCOSE, POC    Collection Time: 05/31/14  8:56 PM   Result Value Ref Range    Glucose (POC) 100 65 - 105 mg/dL   CBC WITH AUTOMATED DIFF    Collection Time: 06/01/14  4:01 AM   Result Value Ref Range    WBC 14.9 (H) 4.0 - 11.0 1000/mm3    RBC 3.93 3.60 - 5.20 M/uL    HGB 11.5 (L) 13.0 - 17.2 gm/dl    HCT 33.7 (L) 37.0 - 50.0 %    MCV 85.8 80.0 - 98.0 fL    MCH 29.3 25.4 - 34.6 pg    MCHC 34.1 30.0 - 36.0 gm/dl    PLATELET 283 140 - 450 1000/mm3    MPV 12.2 (H) 6.0 - 10.0 fL    RDW-SD 39.4 36.4 - 46.3      NRBC 0 0 - 0      IMMATURE GRANULOCYTES 0.4 0.0 - 3.0 %    NEUTROPHILS 87.5 (H) 34 - 64 %    LYMPHOCYTES 9.1 (L) 28 - 48 %    MONOCYTES 2.6 1 - 13 %    EOSINOPHILS 0.0 0 - 5 %    BASOPHILS 0.4 0 - 3 %   METABOLIC PANEL, COMPREHENSIVE    Collection Time: 06/01/14  4:01 AM   Result Value Ref Range    Sodium 130 (L) 136 - 145 mEq/L    Potassium 3.5 3.5 - 5.1 mEq/L    Chloride 97 (L) 98 - 107 mEq/L    CO2 17 (L) 21 - 32 mEq/L    Glucose 349 (H) 74 - 106 mg/dl    BUN 7 7 - 25 mg/dl    Creatinine 1.0 0.6 - 1.3 mg/dl    GFR est AA >60.0      GFR est non-AA >60      Calcium 8.9 8.5 - 10.1  mg/dl    AST 12 (L) 15 - 37 U/L    ALT 13 12 - 78 U/L    Alk. phosphatase 83 45 - 117 U/L    Bilirubin, total 0.8 0.2 - 1.0 mg/dl    Protein, total 7.2 6.4 - 8.2 gm/dl    Albumin 3.6 3.4 - 5.0 gm/dl   MAGNESIUM    Collection Time: 06/01/14  4:01 AM   Result Value Ref Range    Magnesium 1.4 (L) 1.8 - 2.4 mg/dl   GLUCOSE, POC    Collection Time: 06/01/14  8:29 AM   Result Value Ref Range    Glucose (POC) 329 (H) 65 - 105 mg/dL   GLUCOSE, POC    Collection Time: 06/01/14 11:52 AM   Result Value Ref Range    Glucose (POC) 83 65 - 105 mg/dL       No results found.    Active Problems:    * No active hospital problems. *       Prophylaxis:  Lovenox  Coumadin  Hep SQ  SCD???s  H2B/PPI    Disposition:  Home w/ Family   HH PT,OT,RN   SNF/LTC   SAH/Rehab    Care Plan discussed with:    Patient   Family    Care Manager  ED Doc   Specialist :       _______________________________________________________________________        Attending Physician: Stanton Kidney, MD      [facility]

## 2014-06-02 LAB — METABOLIC PANEL, COMPREHENSIVE
ALT (SGPT): 13 U/L (ref 12–78)
AST (SGOT): 8 U/L — ABNORMAL LOW (ref 15–37)
Albumin: 3.1 gm/dl — ABNORMAL LOW (ref 3.4–5.0)
Alk. phosphatase: 73 U/L (ref 45–117)
BUN: 9 mg/dl (ref 7–25)
Bilirubin, total: 0.7 mg/dl (ref 0.2–1.0)
CO2: 22 mEq/L (ref 21–32)
Calcium: 8.2 mg/dl — ABNORMAL LOW (ref 8.5–10.1)
Chloride: 105 mEq/L (ref 98–107)
Creatinine: 0.9 mg/dl (ref 0.6–1.3)
GFR est AA: >60
GFR est non-AA: >60
Glucose: 92 mg/dl (ref 74–106)
Potassium: 3.5 mEq/L (ref 3.5–5.1)
Protein, total: 6.4 gm/dl (ref 6.4–8.2)
Sodium: 136 mEq/L (ref 136–145)

## 2014-06-02 LAB — CBC WITH AUTOMATED DIFF
BASOPHILS: 0.5 % (ref 0–3)
EOSINOPHILS: 0.3 % (ref 0–5)
HCT: 31.8 % — ABNORMAL LOW (ref 37.0–50.0)
HGB: 10.5 gm/dl — ABNORMAL LOW (ref 13.0–17.2)
IMMATURE GRANULOCYTES: 0.3 % (ref 0.0–3.0)
LYMPHOCYTES: 27.8 % — ABNORMAL LOW (ref 28–48)
MCH: 28.6 pg (ref 25.4–34.6)
MCHC: 33 gm/dl (ref 30.0–36.0)
MCV: 86.6 fL (ref 80.0–98.0)
MONOCYTES: 5.2 % (ref 1–13)
MPV: 11.2 fL — ABNORMAL HIGH (ref 6.0–10.0)
NEUTROPHILS: 65.9 % — ABNORMAL HIGH (ref 34–64)
NRBC: 0 (ref 0–0)
PLATELET: 275 10*3/uL (ref 140–450)
RBC: 3.67 M/uL (ref 3.60–5.20)
RDW-SD: 41.3 (ref 36.4–46.3)
WBC: 9.6 10*3/uL (ref 4.0–11.0)

## 2014-06-02 LAB — MAGNESIUM: Magnesium: 2.1 mg/dl (ref 1.8–2.4)

## 2014-06-02 LAB — GLUCOSE, POC
Glucose (POC): 217 mg/dL — ABNORMAL HIGH (ref 65–105)
Glucose (POC): 81 mg/dL (ref 65–105)
Glucose (POC): 102 mg/dL (ref 65–105)
Glucose (POC): 156 mg/dL — ABNORMAL HIGH (ref 65–105)

## 2014-06-02 MED ORDER — METOCLOPRAMIDE 5 MG/5 ML ORAL SOLN
5 mg/ mL | Freq: Three times a day (TID) | ORAL | Status: DC
Start: 2014-06-02 — End: 2014-06-03
  Administered 2014-06-02 – 2014-06-03 (×4): via ORAL

## 2014-06-02 MED ORDER — OXYCODONE-ACETAMINOPHEN 5 MG-325 MG TAB
5-325 mg | ORAL | Status: DC | PRN
Start: 2014-06-02 — End: 2014-06-04
  Administered 2014-06-02 – 2014-06-04 (×5): via ORAL

## 2014-06-02 MED FILL — NS WITH POTASSIUM CHLORIDE 20 MEQ/L IV: 20 mEq/L | INTRAVENOUS | Qty: 1000

## 2014-06-02 MED FILL — MORPHINE 2 MG/ML INJECTION: 2 mg/mL | INTRAMUSCULAR | Qty: 1

## 2014-06-02 MED FILL — INSULIN GLULISINE 100 UNIT/ML INJECTION: 100 unit/mL | SUBCUTANEOUS | Qty: 0.05

## 2014-06-02 MED FILL — METOCLOPRAMIDE 5 MG/5 ML ORAL SOLN: 5 mg/ mL | ORAL | Qty: 10

## 2014-06-02 MED FILL — LOVENOX 40 MG/0.4 ML SUBCUTANEOUS SYRINGE: 40 mg/0.4 mL | SUBCUTANEOUS | Qty: 0.4

## 2014-06-02 MED FILL — INSULIN GLARGINE 100 UNIT/ML INJECTION: 100 unit/mL | SUBCUTANEOUS | Qty: 1

## 2014-06-02 MED FILL — ONDANSETRON (PF) 4 MG/2 ML INJECTION: 4 mg/2 mL | INTRAMUSCULAR | Qty: 2

## 2014-06-02 MED FILL — DIPHENHYDRAMINE HCL 50 MG/ML IJ SOLN: 50 mg/mL | INTRAMUSCULAR | Qty: 1

## 2014-06-02 MED FILL — OXYCODONE-ACETAMINOPHEN 5 MG-325 MG TAB: 5-325 mg | ORAL | Qty: 1

## 2014-06-02 MED FILL — CITALOPRAM 20 MG TAB: 20 mg | ORAL | Qty: 1

## 2014-06-02 MED FILL — LISINOPRIL 20 MG TAB: 20 mg | ORAL | Qty: 1

## 2014-06-02 MED FILL — INSULIN LISPRO 100 UNIT/ML INJECTION: 100 unit/mL | SUBCUTANEOUS | Qty: 1

## 2014-06-02 MED FILL — BD POSIFLUSH NORMAL SALINE 0.9 % INJECTION SYRINGE: INTRAMUSCULAR | Qty: 10

## 2014-06-02 NOTE — Progress Notes (Signed)
NUTRITION follow up    Current diet order: DIET DIABETIC FULL LIQUID    Current intake: poor    Weight:   Last 3 Recorded Weights in this Encounter    05/29/14 0751 05/29/14 1451   Weight: 52.164 kg (115 lb) 50.576 kg (111 lb 8 oz)       BMI: Body mass index is 18.01 kg/(m^2).    Physical assessment:  ?? GI symptoms: continues to have pain and nausea with meals    Biochemical data: BS=[id number]     Assessment of current MNT: Diet/intake is not adequate to meet nutritional needs    Nutrition diagnosis: malnutrition related to gastroparesis- continues    Nutrition recommendation: continue diabetic full liquid diet until tolerance of food is improved    Monitoring and evaluation: PO intake, diet tolerance and compliance, wt, BS, CMP, hydration, medical changes    Nutrition goals: PO intake >75% without abdominal pain, nausea and vomiting    Progress towards goals: _0  Met/Ongoing   _1  Progressing _2  Not Progressing    Level of care: moderate, [place], RD  06/02/2014

## 2014-06-02 NOTE — Progress Notes (Signed)
Medical Progress Note      NAME: [name] [name]   DOB:  1985/06/28  MRN:             [id number]    Date/Time: 06/02/2014  9:06 AM      Assessment:     1. Intractable Nausea and vomiting, not tolerating PO yet  2. Hypokalemia, repleted  3. Gastroparesis, with abdominal pain  4. DM-I, uncontrolled  5. Hypomagnesemia    Plan:     ?? Patient is not eating, said not tolerating PO at this time.  ?? D/c IV narcotic, will switch to Percocet   ?? Replete electrolytes  ?? Continue IVF, IV zofran,   ?? Increase reglan to 10 mg before meals  ?? Monitor blood glucose  ?? May d/c home tomorrow if tolerate PO today  ?? Changed to inpatient status    Subjective:     C/o nausea and vomiting, " I couldn't eat"    Objective:     Vitals:      Last 24hrs VS reviewed since prior progress note. Most recent are:  Visit Vitals   Item Reading   ??? BP 180/106 mmHg   ??? Pulse 113   ??? Temp 98.5 ??F (36.9 ??C)   ??? Resp 18   ??? Ht 5\' 6"  (1.676 m)   ??? Wt 50.576 kg (111 lb 8 oz)   ??? BMI 18.01 kg/m2   ??? SpO2 100%   ??? Breastfeeding No     SpO2 Readings from Last 6 Encounters:   06/02/14 100%   05/27/14 100%   05/25/14 98%   05/12/14 100%   05/07/14 100%   05/05/14 100%            Intake/Output Summary (Last 24 hours) at 06/02/14 08/02/14  Last data filed at 06/02/14 08/02/14   Gross per 24 hour   Intake   1223 ml   Output      0 ml   Net   1223 ml        Exam:     General:   Not in acute distress  HEENT: PERRLA, Neck Supple,  No JVD  Respiratory:   CTA bilaterally-no wheezes, rales, rhonchi, or crackles  Cardiac:  Regular Rate and Rythmn  - no murmurs, rubs or gallops  Abdominal:  Soft, non-tender, non-distended, positive bowel sounds  Extremities:  No cyanosis, or edema.  Skin: No rash  Neurological:  No focal neurological deficits    Medication:     Current Medications Reviewed    Current facility-administered medications:   ???  metoclopramide HCl (REGLAN) syrup 10 mg, 10 mg, Oral, TIDAC, 0122, MD   ???  oxyCODONE-acetaminophen (PERCOCET) 5-325 mg per tablet 1 Tab, 1 Tab, Oral, Q4H PRN, Theodis Aguas, MD  ???  hydrALAZINE (APRESOLINE) 20 mg/mL injection 10 mg, 10 mg, IntraVENous, Q6H PRN, Theodis Aguas, MD, 10 mg at 06/01/14 0125  ???  promethazine (PHENERGAN) 12.5 mg in NS 50 mL IVPB, 12.5 mg, IntraVENous, Q6H PRN, 08/01/14, MD  ???  insulin glargine (LANTUS) injection 20 Units, 20 Units, SubCUTAneous, QHS, Theodis Aguas, MD, 20 Units at 06/01/14 2208  ???  insulin glulisine (APIDRA) injection 5 Units, 5 Units, SubCUTAneous, TIDAC, 2209, MD, Stopped at 05/31/14 0730  ???  insulin lispro (HUMALOG) injection, , SubCUTAneous, AC&HS, 07/31/14, MD, Stopped at 06/02/14 0730  ???  citalopram (CELEXA) tablet 20 mg, 20 mg, Oral, DAILY, 08/02/14, MD, 20  mg at 06/02/14 0845  ???  lisinopril (PRINIVIL, ZESTRIL) tablet 20 mg, 20 mg, Oral, DAILY, Stanton Kidney, MD, 20 mg at 06/02/14 0845  ???  sodium chloride (NS) flush 5-10 mL, 5-10 mL, IntraVENous, Q8H, Stanton Kidney, MD, 10 mL at 06/02/14 0848  ???  sodium chloride (NS) flush 5-10 mL, 5-10 mL, IntraVENous, PRN, Stanton Kidney, MD, 10 mL at 05/30/14 1516  ???  naloxone (NARCAN) injection 0.1 mg, 0.1 mg, IntraVENous, PRN, Stanton Kidney, MD  ???  acetaminophen (TYLENOL) tablet 650 mg, 650 mg, Oral, Q6H PRN, Stanton Kidney, MD  ???  enoxaparin (LOVENOX) injection 40 mg, 40 mg, SubCUTAneous, Q24H, Stanton Kidney, MD, Stopped at 05/31/14 1700  ???  ondansetron (ZOFRAN) injection 4 mg, 4 mg, IntraVENous, Q4H PRN, Stanton Kidney, MD, 4 mg at 06/02/14 0845  ???  0.9% sodium chloride with KCl 20 mEq/L infusion, , IntraVENous, CONTINUOUS, Stanton Kidney, MD, Last Rate: 100 mL/hr at 06/02/14 0508  ???  diphenhydrAMINE (BENADRYL) injection 25 mg, 25 mg, IntraVENous, Q6H PRN, Stanton Kidney, MD, 25 mg at 06/02/14 0507    Lab:     Lab Data Reviewed: (see below)  Recent Results (from the past 24 hour(s))   GLUCOSE, POC    Collection Time: 06/01/14 11:52 AM    Result Value Ref Range    Glucose (POC) 83 65 - 105 mg/dL   GLUCOSE, POC    Collection Time: 06/01/14  5:19 PM   Result Value Ref Range    Glucose (POC) 92 65 - 105 mg/dL   GLUCOSE, POC    Collection Time: 06/01/14  9:19 PM   Result Value Ref Range    Glucose (POC) 217 (H) 65 - 737 mg/dL   METABOLIC PANEL, COMPREHENSIVE    Collection Time: 06/02/14  1:47 AM   Result Value Ref Range    Sodium 136 136 - 145 mEq/L    Potassium 3.5 3.5 - 5.1 mEq/L    Chloride 105 98 - 107 mEq/L    CO2 22 21 - 32 mEq/L    Glucose 92 74 - 106 mg/dl    BUN 9 7 - 25 mg/dl    Creatinine 0.9 0.6 - 1.3 mg/dl    GFR est AA >60.0      GFR est non-AA >60      Calcium 8.2 (L) 8.5 - 10.1 mg/dl    AST 8 (L) 15 - 37 U/L    ALT 13 12 - 78 U/L    Alk. phosphatase 73 45 - 117 U/L    Bilirubin, total 0.7 0.2 - 1.0 mg/dl    Protein, total 6.4 6.4 - 8.2 gm/dl    Albumin 3.1 (L) 3.4 - 5.0 gm/dl   CBC WITH AUTOMATED DIFF    Collection Time: 06/02/14  1:47 AM   Result Value Ref Range    WBC 9.6 4.0 - 11.0 1000/mm3    RBC 3.67 3.60 - 5.20 M/uL    HGB 10.5 (L) 13.0 - 17.2 gm/dl    HCT 31.8 (L) 37.0 - 50.0 %    MCV 86.6 80.0 - 98.0 fL    MCH 28.6 25.4 - 34.6 pg    MCHC 33.0 30.0 - 36.0 gm/dl    PLATELET 275 140 - 450 1000/mm3    MPV 11.2 (H) 6.0 - 10.0 fL    RDW-SD 41.3 36.4 - 46.3      NRBC 0 0 - 0      IMMATURE  GRANULOCYTES 0.3 0.0 - 3.0 %    NEUTROPHILS 65.9 (H) 34 - 64 %    LYMPHOCYTES 27.8 (L) 28 - 48 %    MONOCYTES 5.2 1 - 13 %    EOSINOPHILS 0.3 0 - 5 %    BASOPHILS 0.5 0 - 3 %   MAGNESIUM    Collection Time: 06/02/14  1:47 AM   Result Value Ref Range    Magnesium 2.1 1.8 - 2.4 mg/dl   GLUCOSE, POC    Collection Time: 06/02/14  7:25 AM   Result Value Ref Range    Glucose (POC) 81 65 - 105 mg/dL       No results found.    Active Problems:    Intractable nausea and vomiting (06/02/2014)        Prophylaxis:  Lovenox  Coumadin  Hep SQ  SCD???s  H2B/PPI    Disposition:  Home w/ Family   HH PT,OT,RN   SNF/LTC   SAH/Rehab    Care Plan discussed with:     Patient   Family    Care Manager  ED Doc   Specialist :       _______________________________________________________________________        Attending Physician: Stanton Kidney, MD      [facility]

## 2014-06-02 NOTE — Progress Notes (Signed)
Problem: Falls - Risk of  Goal: *Absence of falls  Outcome: Progressing Towards Goal  Fall precautions in place, no falls this shift. Patient's bed is locked in lowest position with call light and phone within reach. Patient educated on the importance of calling for assistance. Will continue to monitor.

## 2014-06-03 LAB — METABOLIC PANEL, COMPREHENSIVE
ALT (SGPT): 15 U/L (ref 12–78)
AST (SGOT): 13 U/L — ABNORMAL LOW (ref 15–37)
Albumin: 4 gm/dl (ref 3.4–5.0)
Alk. phosphatase: 92 U/L (ref 45–117)
BUN: 4 mg/dl — ABNORMAL LOW (ref 7–25)
Bilirubin, total: 1.1 mg/dl — ABNORMAL HIGH (ref 0.2–1.0)
CO2: 23 mEq/L (ref 21–32)
Calcium: 9.2 mg/dl (ref 8.5–10.1)
Chloride: 102 mEq/L (ref 98–107)
Creatinine: 0.9 mg/dl (ref 0.6–1.3)
GFR est AA: >60
GFR est non-AA: >60
Glucose: 53 mg/dl — ABNORMAL LOW (ref 74–106)
Potassium: 3.1 mEq/L — ABNORMAL LOW (ref 3.5–5.1)
Protein, total: 8 gm/dl (ref 6.4–8.2)
Sodium: 136 mEq/L (ref 136–145)

## 2014-06-03 LAB — CBC WITH AUTOMATED DIFF
BASOPHILS: 0.7 % (ref 0–3)
EOSINOPHILS: 1.4 % (ref 0–5)
HCT: 39.1 % (ref 37.0–50.0)
HGB: 13.1 gm/dl (ref 13.0–17.2)
IMMATURE GRANULOCYTES: 0.4 % (ref 0.0–3.0)
LYMPHOCYTES: 39.6 % (ref 28–48)
MCH: 28.8 pg (ref 25.4–34.6)
MCHC: 33.5 gm/dl (ref 30.0–36.0)
MCV: 85.9 fL (ref 80.0–98.0)
MONOCYTES: 5.6 % (ref 1–13)
MPV: 12 fL — ABNORMAL HIGH (ref 6.0–10.0)
NEUTROPHILS: 52.3 % (ref 34–64)
NRBC: 0 (ref 0–0)
PLATELET: 364 10*3/uL (ref 140–450)
RBC: 4.55 M/uL (ref 3.60–5.20)
RDW-SD: 40.3 (ref 36.4–46.3)
WBC: 9 10*3/uL (ref 4.0–11.0)

## 2014-06-03 LAB — GLUCOSE, POC
Glucose (POC): 172 mg/dL — ABNORMAL HIGH (ref 65–105)
Glucose (POC): 124 mg/dL — ABNORMAL HIGH (ref 65–105)
Glucose (POC): 156 mg/dL — ABNORMAL HIGH (ref 65–105)
Glucose (POC): 190 mg/dL — ABNORMAL HIGH (ref 65–105)

## 2014-06-03 LAB — MAGNESIUM: Magnesium: 1.4 mg/dl — ABNORMAL LOW (ref 1.8–2.4)

## 2014-06-03 MED ORDER — MORPHINE 2 MG/ML INJECTION
2 mg/mL | Freq: Once | INTRAMUSCULAR | Status: AC
Start: 2014-06-03 — End: 2014-06-03
  Administered 2014-06-03: 14:00:00 via INTRAVENOUS

## 2014-06-03 MED ORDER — METOCLOPRAMIDE 5 MG/5 ML ORAL SOLN
5 mg/ mL | Freq: Four times a day (QID) | ORAL | Status: DC
Start: 2014-06-03 — End: 2014-06-04
  Administered 2014-06-03 – 2014-06-04 (×4): via ORAL

## 2014-06-03 MED FILL — BD POSIFLUSH NORMAL SALINE 0.9 % INJECTION SYRINGE: INTRAMUSCULAR | Qty: 10

## 2014-06-03 MED FILL — MORPHINE 2 MG/ML INJECTION: 2 mg/mL | INTRAMUSCULAR | Qty: 1

## 2014-06-03 MED FILL — ONDANSETRON (PF) 4 MG/2 ML INJECTION: 4 mg/2 mL | INTRAMUSCULAR | Qty: 2

## 2014-06-03 MED FILL — DIPHENHYDRAMINE HCL 50 MG/ML IJ SOLN: 50 mg/mL | INTRAMUSCULAR | Qty: 1

## 2014-06-03 MED FILL — CITALOPRAM 20 MG TAB: 20 mg | ORAL | Qty: 1

## 2014-06-03 MED FILL — METOCLOPRAMIDE 5 MG/5 ML ORAL SOLN: 5 mg/ mL | ORAL | Qty: 10

## 2014-06-03 MED FILL — NS WITH POTASSIUM CHLORIDE 20 MEQ/L IV: 20 mEq/L | INTRAVENOUS | Qty: 1000

## 2014-06-03 MED FILL — INSULIN LISPRO 100 UNIT/ML INJECTION: 100 unit/mL | SUBCUTANEOUS | Qty: 1

## 2014-06-03 MED FILL — LISINOPRIL 20 MG TAB: 20 mg | ORAL | Qty: 1

## 2014-06-03 MED FILL — OXYCODONE-ACETAMINOPHEN 5 MG-325 MG TAB: 5-325 mg | ORAL | Qty: 1

## 2014-06-03 MED FILL — HYDRALAZINE 20 MG/ML IJ SOLN: 20 mg/mL | INTRAMUSCULAR | Qty: 1

## 2014-06-03 MED FILL — PROMETHAZINE IN NS 12.5 MG/50 ML IV PIGGY BAG: 12.5 mg/50 ml | INTRAVENOUS | Qty: 50

## 2014-06-03 NOTE — Other (Signed)
Bedside and Verbal shift change report given to Chuck, RN (oncoming nurse) by Barbara, RN (offgoing nurse). Report included the following information SBAR and MAR.

## 2014-06-03 NOTE — Other (Signed)
Bedside and Verbal shift change report given to Lavella LemonsBarbara Baird,rn (oncoming nurse) by Mable Parisathy mason,rn (offgoing nurse). Report included the following information SBAR, Kardex, MAR and Cardiac Rhythm sinus tachycardia.

## 2014-06-03 NOTE — Progress Notes (Signed)
Medical Progress Note      NAME: [name] [name]   DOB:  1985-05-11  MRN:             [id number]    Date/Time: 06/03/2014  1:31 PM      Assessment:     1. Intractable Nausea and vomiting, still c/o not tolerating PO  2. Hypokalemia, repleted  3. Gastroparesis, with abdominal pain  4. DM-I, uncontrolled  5. Hypomagnesemia    Plan:     ?? Patient claimed that she is still not able to take PO. I see her diet not touched at bedside  ?? I d/w her about pain meds and will stop IV narcotics  ?? Replete electrolytes as needed  ?? Continue IVF, IV zofran,   ?? On reglan to 10 mg before meals  ?? Monitor blood glucose    Subjective:     Still not tolerating PO    Objective:     Vitals:      Last 24hrs VS reviewed since prior progress note. Most recent are:  Visit Vitals   Item Reading   ??? BP 134/93 mmHg   ??? Pulse 107   ??? Temp 98 ??F (36.7 ??C)   ??? Resp 19   ??? Ht 5' 6" (1.676 m)   ??? Wt 50.576 kg (111 lb 8 oz)   ??? BMI 18.01 kg/m2   ??? SpO2 100%   ??? Breastfeeding No     SpO2 Readings from Last 6 Encounters:   06/03/14 100%   05/27/14 100%   05/25/14 98%   05/12/14 100%   05/07/14 100%   05/05/14 100%            Intake/Output Summary (Last 24 hours) at 06/03/14 1331  Last data filed at 06/03/14 0600   Gross per 24 hour   Intake   1200 ml   Output      0 ml   Net   1200 ml        Exam:     General:   Not in acute distress  HEENT: PERRLA, Neck Supple,  No JVD  Respiratory:   CTA bilaterally-no wheezes, rales, rhonchi, or crackles  Cardiac:  Regular Rate and Rythmn  - no murmurs, rubs or gallops  Abdominal:  Soft, non-tender, non-distended, positive bowel sounds  Extremities:  No cyanosis, or edema.  Skin: No rash  Neurological:  No focal neurological deficits    Medication:     Current Medications Reviewed    Current facility-administered medications:   ???  metoclopramide HCl (REGLAN) syrup 10 mg, 10 mg, Oral, TIDAC, Stanton Kidney, MD, 10 mg at 06/03/14 1244   ???  oxyCODONE-acetaminophen (PERCOCET) 5-325 mg per tablet 1 Tab, 1 Tab, Oral, Q4H PRN, Stanton Kidney, MD, 1 Tab at 06/03/14 1247  ???  hydrALAZINE (APRESOLINE) 20 mg/mL injection 10 mg, 10 mg, IntraVENous, Q6H PRN, Rosalene Billings, MD, 10 mg at 06/03/14 0643  ???  promethazine (PHENERGAN) 12.5 mg in NS 50 mL IVPB, 12.5 mg, IntraVENous, Q6H PRN, Stanton Kidney, MD, Last Rate: 200 mL/hr at 06/03/14 0644, 12.5 mg at 06/03/14 0644  ???  insulin glargine (LANTUS) injection 20 Units, 20 Units, SubCUTAneous, QHS, Stanton Kidney, MD, 20 Units at 06/02/14 2226  ???  insulin glulisine (APIDRA) injection 5 Units, 5 Units, SubCUTAneous, TIDAC, Stanton Kidney, MD, Stopped at 05/31/14 0730  ???  insulin lispro (HUMALOG) injection, , SubCUTAneous, AC&HS, Stanton Kidney, MD, Stopped at 06/03/14 0730  ???  citalopram (CELEXA) tablet 20 mg, 20 mg, Oral, DAILY, Stanton Kidney, MD, 20 mg at 06/02/14 0845  ???  lisinopril (PRINIVIL, ZESTRIL) tablet 20 mg, 20 mg, Oral, DAILY, Stanton Kidney, MD, 20 mg at 06/03/14 1000  ???  sodium chloride (NS) flush 5-10 mL, 5-10 mL, IntraVENous, Q8H, Stanton Kidney, MD, 10 mL at 06/03/14 0600  ???  sodium chloride (NS) flush 5-10 mL, 5-10 mL, IntraVENous, PRN, Stanton Kidney, MD, 10 mL at 05/30/14 1516  ???  naloxone (NARCAN) injection 0.1 mg, 0.1 mg, IntraVENous, PRN, Stanton Kidney, MD  ???  acetaminophen (TYLENOL) tablet 650 mg, 650 mg, Oral, Q6H PRN, Stanton Kidney, MD  ???  enoxaparin (LOVENOX) injection 40 mg, 40 mg, SubCUTAneous, Q24H, Stanton Kidney, MD, Stopped at 05/31/14 1700  ???  ondansetron (ZOFRAN) injection 4 mg, 4 mg, IntraVENous, Q4H PRN, Stanton Kidney, MD, 4 mg at 06/02/14 0845  ???  0.9% sodium chloride with KCl 20 mEq/L infusion, , IntraVENous, CONTINUOUS, Stanton Kidney, MD, Last Rate: 100 mL/hr at 06/03/14 1101  ???  diphenhydrAMINE (BENADRYL) injection 25 mg, 25 mg, IntraVENous, Q6H PRN, Stanton Kidney, MD, 25 mg at 06/03/14 1046    Lab:     Lab Data Reviewed: (see below)   Recent Results (from the past 24 hour(s))   GLUCOSE, POC    Collection Time: 06/02/14  4:30 PM   Result Value Ref Range    Glucose (POC) 156 (H) 65 - 105 mg/dL   GLUCOSE, POC    Collection Time: 06/02/14  9:34 PM   Result Value Ref Range    Glucose (POC) 172 (H) 65 - 105 mg/dL   CBC WITH AUTOMATED DIFF    Collection Time: 06/03/14  5:47 AM   Result Value Ref Range    WBC 9.0 4.0 - 11.0 1000/mm3    RBC 4.55 3.60 - 5.20 M/uL    HGB 13.1 13.0 - 17.2 gm/dl    HCT 39.1 37.0 - 50.0 %    MCV 85.9 80.0 - 98.0 fL    MCH 28.8 25.4 - 34.6 pg    MCHC 33.5 30.0 - 36.0 gm/dl    PLATELET 364 140 - 450 1000/mm3    MPV 12.0 (H) 6.0 - 10.0 fL    RDW-SD 40.3 36.4 - 46.3      NRBC 0 0 - 0      IMMATURE GRANULOCYTES 0.4 0.0 - 3.0 %    NEUTROPHILS 52.3 34 - 64 %    LYMPHOCYTES 39.6 28 - 48 %    MONOCYTES 5.6 1 - 13 %    EOSINOPHILS 1.4 0 - 5 %    BASOPHILS 0.7 0 - 3 %   MAGNESIUM    Collection Time: 06/03/14  5:47 AM   Result Value Ref Range    Magnesium 1.4 (L) 1.8 - 2.4 mg/dl   METABOLIC PANEL, COMPREHENSIVE    Collection Time: 06/03/14  5:47 AM   Result Value Ref Range    Sodium 136 136 - 145 mEq/L    Potassium 3.1 (L) 3.5 - 5.1 mEq/L    Chloride 102 98 - 107 mEq/L    CO2 23 21 - 32 mEq/L    Glucose 53 (L) 74 - 106 mg/dl    BUN 4 (L) 7 - 25 mg/dl    Creatinine 0.9 0.6 - 1.3 mg/dl    GFR est AA >60.0      GFR est non-AA >60  Calcium 9.2 8.5 - 10.1 mg/dl    AST 13 (L) 15 - 37 U/L    ALT 15 12 - 78 U/L    Alk. phosphatase 92 45 - 117 U/L    Bilirubin, total 1.1 (H) 0.2 - 1.0 mg/dl    Protein, total 8.0 6.4 - 8.2 gm/dl    Albumin 4.0 3.4 - 5.0 gm/dl   GLUCOSE, POC    Collection Time: 06/03/14  8:04 AM   Result Value Ref Range    Glucose (POC) 124 (H) 65 - 105 mg/dL   GLUCOSE, POC    Collection Time: 06/03/14 12:07 PM   Result Value Ref Range    Glucose (POC) 156 (H) 65 - 105 mg/dL       No results found.    Active Problems:    Intractable nausea and vomiting (06/02/2014)        Prophylaxis:  Lovenox  Coumadin  Hep SQ  SCD???s  H2B/PPI     Disposition:  Home w/ Family   HH PT,OT,RN   SNF/LTC   SAH/Rehab    Care Plan discussed with:    Patient   Family    Care Manager  ED Doc   Specialist :       _______________________________________________________________________        Attending Physician: Stanton Kidney, MD      [facility]

## 2014-06-03 NOTE — Other (Signed)
Pt refused po pain medication. Requests 1 time dose of iv morphine. Notified dr Tenny Crawterefe. New orders received.

## 2014-06-03 NOTE — Other (Signed)
Went into room to give medications and pt not in room. Looked on unit, pt still not present.

## 2014-06-03 NOTE — Progress Notes (Signed)
Problem: Falls - Risk of  Goal: *Absence of falls  Outcome: Progressing Towards Goal  Pt remains free from fall during shift.

## 2014-06-04 LAB — MAGNESIUM: Magnesium: 1.4 mg/dl — ABNORMAL LOW (ref 1.8–2.4)

## 2014-06-04 LAB — CBC WITH AUTOMATED DIFF
BASOPHILS: 0.5 % (ref 0–3)
EOSINOPHILS: 2.2 % (ref 0–5)
HCT: 30 % — ABNORMAL LOW (ref 37.0–50.0)
HGB: 9.9 gm/dl — ABNORMAL LOW (ref 13.0–17.2)
IMMATURE GRANULOCYTES: 0.2 % (ref 0.0–3.0)
LYMPHOCYTES: 43.5 % (ref 28–48)
MCH: 28.5 pg (ref 25.4–34.6)
MCHC: 33 gm/dl (ref 30.0–36.0)
MCV: 86.5 fL (ref 80.0–98.0)
MONOCYTES: 6 % (ref 1–13)
MPV: 11.7 fL — ABNORMAL HIGH (ref 6.0–10.0)
NEUTROPHILS: 47.6 % (ref 34–64)
NRBC: 0 (ref 0–0)
PLATELET: 276 10*3/uL (ref 140–450)
RBC: 3.47 M/uL — ABNORMAL LOW (ref 3.60–5.20)
RDW-SD: 41.2 (ref 36.4–46.3)
WBC: 9.1 10*3/uL (ref 4.0–11.0)

## 2014-06-04 LAB — METABOLIC PANEL, COMPREHENSIVE
ALT (SGPT): 13 U/L (ref 12–78)
AST (SGOT): 8 U/L — ABNORMAL LOW (ref 15–37)
Albumin: 2.9 gm/dl — ABNORMAL LOW (ref 3.4–5.0)
Alk. phosphatase: 68 U/L (ref 45–117)
BUN: 5 mg/dl — ABNORMAL LOW (ref 7–25)
Bilirubin, total: 0.5 mg/dl (ref 0.2–1.0)
CO2: 23 mEq/L (ref 21–32)
Calcium: 8 mg/dl — ABNORMAL LOW (ref 8.5–10.1)
Chloride: 102 mEq/L (ref 98–107)
Creatinine: 0.8 mg/dl (ref 0.6–1.3)
GFR est AA: >60
GFR est non-AA: >60
Glucose: 155 mg/dl — ABNORMAL HIGH (ref 74–106)
Potassium: 3.3 mEq/L — ABNORMAL LOW (ref 3.5–5.1)
Protein, total: 6 gm/dl — ABNORMAL LOW (ref 6.4–8.2)
Sodium: 134 mEq/L — ABNORMAL LOW (ref 136–145)

## 2014-06-04 LAB — GLUCOSE, POC
Glucose (POC): 52 mg/dL — ABNORMAL LOW (ref 65–105)
Glucose (POC): 61 mg/dL — ABNORMAL LOW (ref 65–105)
Glucose (POC): 187 mg/dL — ABNORMAL HIGH (ref 65–105)
Glucose (POC): 131 mg/dL — ABNORMAL HIGH (ref 65–105)
Glucose (POC): 154 mg/dL — ABNORMAL HIGH (ref 65–105)

## 2014-06-04 MED ORDER — MAGNESIUM SULFATE 2 GRAM/50 ML IVPB
2 gram/50 mL (4 %) | Freq: Once | INTRAVENOUS | Status: AC
Start: 2014-06-04 — End: 2014-06-04
  Administered 2014-06-04: 16:00:00 via INTRAVENOUS

## 2014-06-04 MED ORDER — OXYCODONE-ACETAMINOPHEN 5 MG-325 MG TAB
5-325 mg | ORAL_TABLET | ORAL | Status: DC | PRN
Start: 2014-06-04 — End: 2014-11-28

## 2014-06-04 MED ORDER — POTASSIUM CHLORIDE SR 20 MEQ TAB, PARTICLES/CRYSTALS
20 mEq | ORAL | Status: AC
Start: 2014-06-04 — End: 2014-06-04
  Administered 2014-06-04: 16:00:00 via ORAL

## 2014-06-04 MED ORDER — PROMETHAZINE 12.5 MG TAB
12.5 mg | ORAL_TABLET | Freq: Three times a day (TID) | ORAL | Status: DC | PRN
Start: 2014-06-04 — End: 2014-10-11

## 2014-06-04 MED FILL — METOCLOPRAMIDE 5 MG/5 ML ORAL SOLN: 5 mg/ mL | ORAL | Qty: 10

## 2014-06-04 MED FILL — NS WITH POTASSIUM CHLORIDE 20 MEQ/L IV: 20 mEq/L | INTRAVENOUS | Qty: 1000

## 2014-06-04 MED FILL — LISINOPRIL 20 MG TAB: 20 mg | ORAL | Qty: 1

## 2014-06-04 MED FILL — KLOR-CON M20 MEQ TABLET,EXTENDED RELEASE: 20 mEq | ORAL | Qty: 1

## 2014-06-04 MED FILL — OXYCODONE-ACETAMINOPHEN 5 MG-325 MG TAB: 5-325 mg | ORAL | Qty: 1

## 2014-06-04 MED FILL — MAGNESIUM SULFATE 2 GRAM/50 ML IVPB: 2 gram/50 mL (4 %) | INTRAVENOUS | Qty: 50

## 2014-06-04 MED FILL — DIPHENHYDRAMINE HCL 50 MG/ML IJ SOLN: 50 mg/mL | INTRAMUSCULAR | Qty: 1

## 2014-06-04 MED FILL — HYDRALAZINE 20 MG/ML IJ SOLN: 20 mg/mL | INTRAMUSCULAR | Qty: 1

## 2014-06-04 MED FILL — CITALOPRAM 20 MG TAB: 20 mg | ORAL | Qty: 1

## 2014-06-04 NOTE — Discharge Summary (Signed)
Discharge Summary      Patient ID:    [name] [name]  [id number]  [id number]  28 y.o.  1985-09-12    Admit date: 05/29/2014    Discharge date and time: 06/04/2014    CONSULTATIONS: None    Procedures: none    Admission Diagnoses: intractable vomiting, hypokalmemia  Intractable nausea and vomiting    Chronic Diagnoses:    Problem List as of 06/04/2014  Never Reviewed          Codes Class Noted - Resolved    Intractable nausea and vomiting ICD-10-CM: R11.2  ICD-9-CM: 536.2  06/02/2014 - Present        Diabetic ketoacidosis (HCC) ICD-10-CM: E13.10  ICD-9-CM: 250.10  05/03/2014 - Present        DKA (diabetic ketoacidoses) (HCC) ICD-10-CM: E13.10  ICD-9-CM: 250.10  04/05/2014 - Present              HOSPITAL COURSE:     1. Intractable Nausea and vomiting    Patient presented with c/c of nausea and vomiting  Suspected due to her gastroparesis  Admitted to medical floor.  Started on IVF, Zofran PRN and reglan  Advanced diet slowly, now tolerating well.  D/c home today    2. Hypokalemia, repleted  3. Gastroparesis,   4. DM-I,   5. Hypomagnesemia, repleted      Discharge Medications:   Current Discharge Medication List      START taking these medications    Details   oxyCODONE-acetaminophen (PERCOCET) 5-325 mg per tablet Take 1 Tab by mouth every four (4) hours as needed. Max Daily Amount: 6 Tabs.  Qty: 20 Tab, Refills: 0      promethazine (PHENERGAN) 12.5 mg tablet Take 1 Tab by mouth every eight (8) hours as needed for Nausea.  Qty: 30 Tab, Refills: 0         CONTINUE these medications which have NOT CHANGED    Details   diphenhydrAMINE (BENADRYL) 25 mg capsule Take 25 mg by mouth every six (6) hours as needed. Indications: NAUSEA      LORazepam (ATIVAN) 1 mg tablet Take 1 mg by mouth every four (4) hours as needed for Anxiety (pt states she takes it for gastroparesis pain).      insulin glargine (LANTUS SOLOSTAR) 100 unit/mL (3 mL) pen 25 Units by SubCUTAneous route nightly.  Qty: 1 Each, Refills: 0       citalopram (CELEXA) 20 mg tablet Take 20 mg by mouth daily.      lisinopril (PRINIVIL, ZESTRIL) 20 mg tablet Take 20 mg by mouth daily.      metoclopramide HCl (REGLAN) 5 mg/5 mL syrup Take 1 tsp by mouth Before breakfast, lunch, and dinner.      insulin glulisine (APIDRA SOLOSTAR) 100 unit/mL pen 5 Units by SubCUTAneous route Before breakfast, lunch, and dinner. Indications: TYPE 1 DIABETES MELLITUS, pt states this is her sliding scale insulin              Significant Diagnostic Studies:   Recent Labs      06/04/14   0150  06/03/14   0547   WBC  9.1  9.0   HGB  9.9*  13.1   HCT  30.0*  39.1   PLT  276  364     Recent Labs      06/04/14   0150  06/03/14   0547  06/02/14   0147   NA  134*  136  136   K  3.3*  3.1*  3.5   CL  102  102  105   CO2  23  23  22    BUN  5*  4*  9   CREA  0.8  0.9  0.9   GLU  155*  53*  92   CA  8.0*  9.2  8.2*   MG  1.4*  1.4*  2.1     Recent Labs      06/04/14   0150  06/03/14   0547  06/02/14   0147   SGOT  8*  13*  8*   AP  68  92  73   TP  6.0*  8.0  6.4   ALB  2.9*  4.0  3.1*     No results for input(s): INR, PTP, APTT in the last 72 hours.    Invalid input(s): INREXT   No results for input(s): FE, TIBC, PSAT, FERR in the last 72 hours.   No results for input(s): PH, PCO2, PO2 in the last 72 hours.  No results for input(s): CPK, CKMB in the last 72 hours.    Invalid input(s): TROPONINI  No components found for: GLPOC    Follow up Care:    1. WEI D ZHAO, MD in 1-2 weeks    Diet:  Diabetic Diet    Disposition:  Home.    Discharge Condition: Good    Total D/c time: 35 minutes      Signed:  Theodis AguasYared G Juliani Laduke, MD  06/04/2014  11:39 AM

## 2014-06-04 NOTE — Progress Notes (Signed)
Pt transported via stretcher to car. Pt verbalized understanding of discharge instructions.

## 2014-06-04 NOTE — Progress Notes (Signed)
Bedside and Verbal shift change report given to Lavella LemonsBarbara Baird,rn (oncoming nurse) by Charlesmccready,rn (offgoing nurse). Report included the following information SBAR, Kardex and Cardiac Rhythm sinus tachycardia.

## 2014-06-04 NOTE — Progress Notes (Signed)
Blood pressure continued to remain elevated after 10mg  hydralazine at 0350.  Dr. Langston MaskerMorris consulted via phone and advise to continue to monitor, recheck b/p at 0700 and call if it remained elevated.

## 2014-06-18 ENCOUNTER — Inpatient Hospital Stay
Admit: 2014-06-18 | Discharge: 2014-06-18 | Disposition: A | Discharge status: Home / Self Care | Payer: MEDICAID | Attending: Emergency Medicine

## 2014-06-18 DIAGNOSIS — R1084 Generalized abdominal pain: Secondary | ICD-10-CM

## 2014-06-18 LAB — POC URINE MACROSCOPIC
Bilirubin: NEGATIVE
Blood: NEGATIVE
Glucose: 500 mg/dl — AB
Ketone: 40 mg/dl — AB
Leukocyte Esterase: NEGATIVE
Nitrites: NEGATIVE
Protein: 30 mg/dl — AB
Specific gravity: 1.02 (ref 1.005–1.030)
Urobilinogen: 0.2 EU/dl (ref 0.0–1.0)
pH (UA): 7 (ref 5–9)

## 2014-06-18 LAB — METABOLIC PANEL, BASIC
BUN: 11 mg/dl (ref 7–25)
CO2: 24 mEq/L (ref 21–32)
Calcium: 9 mg/dl (ref 8.5–10.1)
Chloride: 101 mEq/L (ref 98–107)
Creatinine: 0.9 mg/dl (ref 0.6–1.3)
GFR est AA: >60
GFR est non-AA: >60
Glucose: 275 mg/dl — ABNORMAL HIGH (ref 74–106)
Potassium: 3.8 mEq/L (ref 3.5–5.1)
Sodium: 134 mEq/L — ABNORMAL LOW (ref 136–145)

## 2014-06-18 LAB — HEPATIC FUNCTION PANEL
ALT (SGPT): 17 U/L (ref 12–78)
AST (SGOT): 10 U/L — ABNORMAL LOW (ref 15–37)
Albumin: 3.6 gm/dl (ref 3.4–5.0)
Alk. phosphatase: 95 U/L (ref 45–117)
Bilirubin, direct: 0.1 mg/dl (ref 0.0–0.2)
Bilirubin, total: 0.5 mg/dl (ref 0.2–1.0)
Protein, total: 7.8 gm/dl (ref 6.4–8.2)

## 2014-06-18 LAB — CBC WITH AUTOMATED DIFF
BASOPHILS: 0.4 % (ref 0–3)
EOSINOPHILS: 0 % (ref 0–5)
HCT: 38.2 % (ref 37.0–50.0)
HGB: 12.7 gm/dl — ABNORMAL LOW (ref 13.0–17.2)
IMMATURE GRANULOCYTES: 0.2 % (ref 0.0–3.0)
LYMPHOCYTES: 16.5 % — ABNORMAL LOW (ref 28–48)
MCH: 28.1 pg (ref 25.4–34.6)
MCHC: 33.2 gm/dl (ref 30.0–36.0)
MCV: 84.5 fL (ref 80.0–98.0)
MONOCYTES: 2.7 % (ref 1–13)
MPV: 10.9 fL — ABNORMAL HIGH (ref 6.0–10.0)
NEUTROPHILS: 80.2 % — ABNORMAL HIGH (ref 34–64)
NRBC: 0 (ref 0–0)
PLATELET: 263 10*3/uL (ref 140–450)
RBC: 4.52 M/uL (ref 3.60–5.20)
RDW-SD: 40.1 (ref 36.4–46.3)
WBC: 9 10*3/uL (ref 4.0–11.0)

## 2014-06-18 LAB — POC HCG,URINE: HCG urine, QL: NEGATIVE

## 2014-06-18 LAB — LIPASE: Lipase: 101 U/L (ref 73–393)

## 2014-06-18 LAB — MAGNESIUM: Magnesium: 1.8 mg/dl (ref 1.8–2.4)

## 2014-06-18 MED ORDER — DIPHENHYDRAMINE HCL 50 MG/ML IJ SOLN
50 mg/mL | INTRAMUSCULAR | Status: AC
Start: 2014-06-18 — End: 2014-06-18
  Administered 2014-06-18: 18:00:00 via INTRAVENOUS

## 2014-06-18 MED ORDER — SODIUM CHLORIDE 0.9% BOLUS IV
0.9 % | Freq: Once | INTRAVENOUS | Status: AC
Start: 2014-06-18 — End: 2014-06-18
  Administered 2014-06-18: 18:00:00 via INTRAVENOUS

## 2014-06-18 MED ORDER — DIAZEPAM 5 MG/ML SYRINGE
5 mg/mL | INTRAMUSCULAR | Status: AC
Start: 2014-06-18 — End: 2014-06-18
  Administered 2014-06-18: 19:00:00 via INTRAVENOUS

## 2014-06-18 MED ORDER — ONDANSETRON (PF) 4 MG/2 ML INJECTION
4 mg/2 mL | Freq: Once | INTRAMUSCULAR | Status: AC
Start: 2014-06-18 — End: 2014-06-18
  Administered 2014-06-18: 18:00:00 via INTRAVENOUS

## 2014-06-18 MED ORDER — HALOPERIDOL LACTATE 5 MG/ML IJ SOLN
5 mg/mL | INTRAMUSCULAR | Status: AC
Start: 2014-06-18 — End: 2014-06-18
  Administered 2014-06-18: 18:00:00 via INTRAMUSCULAR

## 2014-06-18 MED FILL — ONDANSETRON (PF) 4 MG/2 ML INJECTION: 4 mg/2 mL | INTRAMUSCULAR | Qty: 2

## 2014-06-18 MED FILL — DIPHENHYDRAMINE HCL 50 MG/ML IJ SOLN: 50 mg/mL | INTRAMUSCULAR | Qty: 1

## 2014-06-18 MED FILL — DIAZEPAM 5 MG/ML SYRINGE: 5 mg/mL | INTRAMUSCULAR | Qty: 2

## 2014-06-18 MED FILL — HALOPERIDOL LACTATE 5 MG/ML IJ SOLN: 5 mg/mL | INTRAMUSCULAR | Qty: 1

## 2014-06-18 NOTE — ED Provider Notes (Signed)
[facility]  EMERGENCY DEPARTMENT TREATMENT REPORT  NAME:  [name] [name]  SEX:   F  ADMIT: 06/18/2014  DOB:   01/25/1986  MR#    [id number]  ROOM:  JX91  TIME DICTATED: 01 50 PM  ACCT#  [id number]    cc: Jerral Bonito MD    TIME OF EVALUATION:     1:46 p.m.    PRIMARY CARE PHYSICIAN:   Dr. Dion Body    CHIEF COMPLAINT:  Abdominal pain.    HISTORY OF PRESENT ILLNESS:  A 29 year old female with history of gastroparesis and diabetes presents with   diffuse abdominal pain radiating to her right shoulder.  Reports vomiting   times 3, nonbloody, nonbilious, mostly food from the night previously, not   tolerating p.o. at the current time.  Denies fever, denies chest pain, denies   vaginal bleeding or discharge.  Denies diarrhea or constipation.    REVIEW OF SYSTEMS:  CONSTITUTIONAL:  No fever or chills.  EYES:  No visual symptoms.  ENT:  No sore throat.  HEMATOLOGIC:  No bruising.  ALLERGIC:  No rash, no hives.  RESPIRATORY:  No cough, shortness of breath.  CARDIOVASCULAR:  No chest pain or pressure.  GASTROINTESTINAL:  There is abdominal pain.  There is nausea.  There is   vomiting, no diarrhea or constipation.  GENITOURINARY:  No dysuria, frequency or urgency.  No vaginal bleeding, no   vaginal discharge.  Denies pregnancy.  MUSCULOSKELETAL:  No joint pain or swelling.  SKIN:  No rashes.  NEUROLOGICAL:  No headache, no sensory or motor symptoms.    PAST MEDICAL HISTORY:  Gastroparesis, diabetes, hypertension.    PAST SURGICAL HISTORY:  C-section.    SOCIAL HISTORY:    Denies smoking, denies alcohol, denies drug use.    FAMILY HISTORY:  Noncontributory.    MEDICATIONS:     Reviewed in Epic.     ALLERGIES:  REVIEWED IN EPIC.    PHYSICAL EXAMINATION:  VITAL SIGNS:  Temperature 98.4, pulse 106, respirations 16, blood pressure   164/104.    GENERAL APPEARANCE:  The patient appears nontoxic, in no distress, is well   developed, well nourished, acting appropriate.   HEENT:  Eyes:  Conjunctivae clear, lids normal.  PERRLA. Extraocular movements   intact.  Nose:  No nasal discharge.  Mouth:  Good dentation.  No oral   exudates or erythema.  NECK:  Supple, with no adenopathy.  RESPIRATORY:  Clear and equal bilateral.  No respiratory distress or   tachypnea.  CARDIOVASCULAR:  Heart tachycardic, regular, no murmurs, rubs or gallops.  GASTROINTESTINAL:  Abdomen is soft, not distended.  Tenderness noted in all   quadrants, but no rebound, no guarding.  Negative Murphy's.  Negative psoas,   negative obturator.  MUSCULOSKELETAL:  Gait is normal.  Muscle tone is normal.  SKIN:  Warm, dry, no rashes.  NEUROLOGIC:  Alert, oriented.  Sensation intact.  Motor strength equal and   symmetric.  PSYCHIATRIC:  Judgment appears appropriate.    INITIAL ASSESSMENT AND MANAGEMENT PLAN:  A 29 year old female with a history of gastroparesis, presents with diffuse   abdominal pain and vomiting.  Vital signs show patient is tachycardic, not   febrile.  We will do IV fluids, pain medication.  We will treat pain with   Haldol and Benadryl.   Zofran for symptomatic relief as well.  Laboratory   evaluation to include UA and hCG.    CONTINUATION BY Thelma Barge, MD:     TIME  OF EVALUATION:   4:27 p.m.     DIAGNOSTIC RESULTS:  CBC is clinically unremarkable.  UA shows an elevated glucose at 500, protein   of 30, ketones at 40.  Electrolyte evaluation shows a decreased sodium of 134,   glucose at 275, which is patient's baseline.  AST, ALT, alkaline phosphatase   and lipase are within normal limits.  HCG is negative.    REEVALUATION AND EMERGENCY DEPARTMENT COURSE:  Reevaluation of patient shows   the patient symptomatically improved.  Physical exam shows an abdominal exam   which has improved with no tenderness noted, no rebound, no guarding.  The   patient has followup arranged. The patient to follow up with PCM.  Understands   diagnostic results, need to comply with diabetic treatment, and need to    follow up with PCM.    CLINICAL IMPRESSION AND PLAN:  A 29 year old female with abdominal pain and vomiting.    PLAN:  Follow up with PCP.    FINAL DIAGNOSES:  1.  Abdominal pain.  2.  Vomiting.    CONDITION:  Stable.    DISPOSITION:   Discharged home with PCP followup.     The patient was personally evaluated by myself and Dr. Imogene Burnichard Lanijah Warzecha, who   agrees with the above assessment and plan.     CONTINUATION BY Imogene BurnICHARD Tiesha Marich, MD:      FINAL ED COURSE:    I interviewed and examined the patient.  I discussed with the resident and   agree with their evaluation and plan as documented here.      The patient had essentially normal lab work except for elevated glucose, was   feeling better after medications, and I am comfortable discharging her home to   follow up with her regular doctor.      ___________________  Imogene Burnichard Leeona Mccardle M.D.  Dictated By: Thelma Bargeobert Sklar, MD    My signature above authenticates this document and my orders, the final   diagnosis (es), discharge prescription (s), and instructions in the Epic   record.  If you have any questions please contact [phone].    Nursing notes have been reviewed by the physician/ advanced practice   clinician.    [facility]  D:06/18/2014 13:50:29  T: 06/18/2014 17:04:52  [id number]

## 2014-07-06 ENCOUNTER — Emergency Department: Admit: 2014-07-07 | Payer: MEDICAID

## 2014-07-06 ENCOUNTER — Inpatient Hospital Stay
Admit: 2014-07-06 | Discharge: 2014-07-07 | Discharge status: Against Medical Advice | Payer: MEDICAID | Attending: Emergency Medicine

## 2014-07-06 DIAGNOSIS — R1110 Vomiting, unspecified: Secondary | ICD-10-CM

## 2014-07-06 NOTE — ED Notes (Signed)
Came in to ER via EMS for c/o abd pain, vomiting and dehydration

## 2014-07-06 NOTE — ED Notes (Signed)
Bedside shift change report given to Tonia, RN (oncoming nurse) by Amui (offgoing nurse). Report included the following information SBAR, Kardex, ED Summary and MAR.

## 2014-07-06 NOTE — ED Notes (Signed)
Pt again requesting pain medications & informed that no orders were entered.

## 2014-07-06 NOTE — Progress Notes (Signed)
Pt already had an established PIV when I arrived to room. Pt's nurse stated she forgot to cx order. IV flushed and assessed and appears to be working just fine.

## 2014-07-06 NOTE — ED Notes (Signed)
Pt ambulatory to restroom.

## 2014-07-06 NOTE — ED Notes (Signed)
In bed resting, no apparent in distress noted at this time, assistance offered to pt.

## 2014-07-06 NOTE — ED Notes (Signed)
Report from [place], [place]

## 2014-07-06 NOTE — ED Provider Notes (Signed)
[facility]  EMERGENCY DEPARTMENT TREATMENT REPORT  NAME:  [name] [name]  SEX:   F  ADMIT: 07/06/2014  DOB:   May 27, 1985  MR#    [id number]  ROOM:  4205  TIME DICTATED: 08 54 PM  ACCT#  [id number]    cc: Jerral Bonito MD    I hereby certify this patient for admission based upon medical necessity as   noted below:    TIME OF EVALUATION:    2015    CHIEF COMPLAINT:  Nausea, vomiting, abdominal pain.    HISTORY OF PRESENT ILLNESS:  This 29 year old female with a history of diabetes and gastroparesis, presents   for evaluation of nausea, vomiting and abdominal pain.  She states her   symptoms started around 7:00 this morning with left upper and left lower   quadrant abdominal pain typical of her gastroparesis.  She became very   nauseated and has had multiple episodes of vomiting.  She thinks she has had   over 10 episodes.  It is bilious but nonbloody.  Any time she tries to eat or   drink anything, it comes right back up.  Her abdominal pain comes in spasms it   is nonradiating.  It is a sharp, crampy pain that precedes the vomiting.  She   is also complaining of some bilateral upper back pain.  She feels like it is   typical spasm type pain.  Denies any chest pain radiating into her back.  No   shortness of breath, no fever or chills.  No lower back pain.  No UTI   symptoms.  She states this afternoon around 1630 she became lightheaded and   passed out for less than 2 minutes, it was witnessed by some family members   who called medics.  The patient was transported via EMS without incident.  Her   blood  sugar at the time was 210 per the patient.  She was hospitalized at   the beginning of May of 2016 for intractable vomiting, hypokalemia and has a   history of DKA.  She states her blood sugars always run a little high.  She   has been giving herself her Lantus and Apidra as needed.  She denies hitting   her head or injuring her neck when she fell.    REVIEW OF SYSTEMS:   CONSTITUTIONAL:  No fever, chills, or weight loss.    EYES:  No visual symptoms.    ENT:  No sore throat, runny nose, or other URI symptoms.    ENDOCRINE:  No polyuria, polyphagia.  Feels dehydrated and thirsty.  HEMATOLOGIC:  No excessive bruising or bleeding.  RESPIRATORY:  No cough, shortness of breath, or wheezing.  CARDIOVASCULAR:  No chest pain, chest pressure, or palpitations.    GASTROINTESTINAL:  No diarrhea.  GENITOURINARY:  No dysuria, frequency, or urgency.  No vaginal bleeding, no   discharge.  MUSCULOSKELETAL:  Upper back pain, no other arthralgias or myalgias.  NEUROLOGIC:  Episode of lightheadedness and 1 syncopal episode.  No current   headache.  No sensory or motor symptoms.    PAST MEDICAL HISTORY:  Insulin-dependent diabetes, gastroparesis, hypertension, history of C-section.    SOCIAL HISTORY:  No tobacco, ETOH or recreational drugs.    ALLERGIES:  PENICILLIN AND ZITHROMAX.    MEDICATIONS:    Lantus, Apidra, Zestril, Reglan, Percocet, Phenergan.  She is not currently   taking the Percocet or the Phenergan and she has Depo shots.    PHYSICAL  EXAMINATION:  VITAL SIGNS:  Blood pressure 146/90, pulse 89, respirations 18, temperature   98.5, O2 sats 98%.  GENERAL:  The patient is a thin-appearing African American female.  She is   unable to find a comfortable position on the bed and has vomitus in an emesis   bag.  She is awake, alert, and oriented, nontoxic appearing, requesting pain   medication.  HEENT:  Eyes anicteric.  Pupils equal, symmetrical and normally reactive.    Mouth/Throat:  Buccal mucosa is dry.  Posterior pharynx without erythema or   lesions.  No edema.  NECK:  Supple, nontender, symmetrical.    LYMPHATICS:  No cervical or submandibular lymphadenopathy palpated.    RESPIRATORY:  Clear and equal breath sounds.  No respiratory distress,   tachypnea, or accessory muscle use.       CARDIOVASCULAR:  Heart regular, without murmurs, gallops, rubs, or thrills.      ABDOMEN:  Bowel sounds present.  Abdomen is soft.  She has some moderate   tenderness in the left upper quadrant and some minimal tenderness in the left   lower quadrant.  No tenderness in the suprapubic region or over the adnexa   transabdominally on my exam.  No organomegaly.  No abdominal or inguinal   masses appreciated by inspection or palpation.  MUSCULOSKELETAL:  Back:  No CVA tenderness.  She has moderate tenderness to   palpation over the right and left thoracic paravertebral muscles that appear   to be in spasm.  No tenderness over the lumbosacral paravertebral muscles.  SKIN:  Warm and dry, no rashes.  NEUROLOGIC:  Alert, oriented.  Sensation intact, motor strength equal and   symmetric.      CONTINUATION BY Tana Conch, MD:    CBC:  White count of 11.5, hemoglobin 11.9, platelets are normal.  Urine   pregnancy negative.  Dip:  500 glucose, 40 ketones, otherwise no nitrites or   leukocyte esterase.  Chemistry:  Sodium 135, glucose 235 with a bicarbonate of   23.  Lipase is 67.  X-ray of the chest interpreted by me:  No acute findings.    EMERGENCY ROOM COURSE:   A patient with a history of gastroparesis who presents with nonbloody   vomiting.  She complains of some muscle spasms, has reproducible pain in the   upper back.  She states she gets this frequently with vomiting and I do not   think this is referred pain from her abdomen which is soft.  She complains of   some left-sided tenderness, but again says that is her typical.  She was noted   at one point to be hypertensive, 193/108.  I went and talked to her just   after disposition in the room and she was 153/93, so I had not ordered any   antihypertensives at that time because her blood pressure had improved.  She   has had intractable nausea despite medication.  Discussed with Dr. Earlie Lou   who has accepted the patient to a floor bed.     DIAGNOSES:  1.  Intractable vomiting.  2.  Hypertension.  3.  Diabetes.       Again, a screening chest x-ray was ordered.  She has this pain back which is   reproducible.  I interpreted the x-ray of the chest as no free air, no acute   findings, no infiltrate, no pneumothorax, no other acute change compared to   previous x-ray, 11/2013.  ___________________  Judeen Hammans Tsuchitani MD  Dictated By: Geoffry Paradise. Katheran James, PA-C    My signature above authenticates this document and my orders, the final   diagnosis (es), discharge prescription (s), and instructions in the Epic   record.  If you have any questions please contact [phone].    Nursing notes have been reviewed by the physician/ advanced practice   clinician.    LC  D:07/06/2014 20:54:25  T: 07/06/2014 21:22:31  [id number]

## 2014-07-07 LAB — CBC WITH AUTOMATED DIFF
BASOPHILS: 0.4 % (ref 0–3)
EOSINOPHILS: 0 % (ref 0–5)
HCT: 36.2 % — ABNORMAL LOW (ref 37.0–50.0)
HGB: 11.9 gm/dl — ABNORMAL LOW (ref 13.0–17.2)
IMMATURE GRANULOCYTES: 0.3 % (ref 0.0–3.0)
LYMPHOCYTES: 20.9 % — ABNORMAL LOW (ref 28–48)
MCH: 27.9 pg (ref 25.4–34.6)
MCHC: 32.9 gm/dl (ref 30.0–36.0)
MCV: 85 fL (ref 80.0–98.0)
MONOCYTES: 3.7 % (ref 1–13)
MPV: 12.2 fL — ABNORMAL HIGH (ref 6.0–10.0)
NEUTROPHILS: 74.7 % — ABNORMAL HIGH (ref 34–64)
NRBC: 0 (ref 0–0)
PLATELET: 258 10*3/uL (ref 140–450)
RBC: 4.26 M/uL (ref 3.60–5.20)
RDW-SD: 40.2 (ref 36.4–46.3)
WBC: 11.5 10*3/uL — ABNORMAL HIGH (ref 4.0–11.0)

## 2014-07-07 LAB — HEMOGLOBIN A1C W/O EAG: Hemoglobin A1c: 8.3 % — ABNORMAL HIGH (ref 4.8–6.0)

## 2014-07-07 LAB — GLUCOSE, POC
Glucose (POC): 191 mg/dL — ABNORMAL HIGH (ref 65–105)
Glucose (POC): 238 mg/dL — ABNORMAL HIGH (ref 65–105)
Glucose (POC): 299 mg/dL — ABNORMAL HIGH (ref 65–105)
Glucose (POC): 109 mg/dL — ABNORMAL HIGH (ref 65–105)

## 2014-07-07 LAB — METABOLIC PANEL, COMPREHENSIVE
ALT (SGPT): 13 U/L (ref 12–78)
AST (SGOT): 25 U/L (ref 15–37)
Albumin: 3.7 gm/dl (ref 3.4–5.0)
Alk. phosphatase: 92 U/L (ref 45–117)
BUN: 11 mg/dl (ref 7–25)
Bilirubin, total: 0.8 mg/dl (ref 0.2–1.0)
CO2: 23 mEq/L (ref 21–32)
Calcium: 8.8 mg/dl (ref 8.5–10.1)
Chloride: 100 mEq/L (ref 98–107)
Creatinine: 1 mg/dl (ref 0.6–1.3)
GFR est AA: >60
GFR est non-AA: >60
Glucose: 235 mg/dl — ABNORMAL HIGH (ref 74–106)
Potassium: 4.3 mEq/L (ref 3.5–5.1)
Protein, total: 7.8 gm/dl (ref 6.4–8.2)
Sodium: 135 mEq/L — ABNORMAL LOW (ref 136–145)

## 2014-07-07 LAB — POC HCG,URINE: HCG urine, QL: NEGATIVE

## 2014-07-07 LAB — POC URINE MACROSCOPIC
Bilirubin: NEGATIVE
Blood: NEGATIVE
Glucose: 500 mg/dl — AB
Ketone: 40 mg/dl — AB
Leukocyte Esterase: NEGATIVE
Nitrites: NEGATIVE
Protein: 100 mg/dl — AB
Specific gravity: 1.02 (ref 1.005–1.030)
Urobilinogen: 1 EU/dl (ref 0.0–1.0)
pH (UA): 8.5 (ref 5–9)

## 2014-07-07 LAB — LIPASE: Lipase: 67 U/L — ABNORMAL LOW (ref 73–393)

## 2014-07-07 MED ORDER — OXYCODONE-ACETAMINOPHEN 5 MG-325 MG TAB
5-325 mg | ORAL | Status: DC | PRN
Start: 2014-07-07 — End: 2014-07-09

## 2014-07-07 MED ORDER — SODIUM CHLORIDE 0.9 % IJ SYRG
Freq: Once | INTRAMUSCULAR | Status: AC
Start: 2014-07-07 — End: 2014-07-06
  Administered 2014-07-07: 01:00:00 via INTRAVENOUS

## 2014-07-07 MED ORDER — DIPHENHYDRAMINE HCL 50 MG/ML IJ SOLN
50 mg/mL | Freq: Once | INTRAMUSCULAR | Status: AC
Start: 2014-07-07 — End: 2014-07-06
  Administered 2014-07-07: 01:00:00 via INTRAVENOUS

## 2014-07-07 MED ORDER — HYDRALAZINE 20 MG/ML IJ SOLN
20 mg/mL | Freq: Four times a day (QID) | INTRAMUSCULAR | Status: DC | PRN
Start: 2014-07-07 — End: 2014-07-09
  Administered 2014-07-07 – 2014-07-08 (×3): via INTRAVENOUS

## 2014-07-07 MED ORDER — SODIUM CHLORIDE 0.9% BOLUS IV
0.9 % | INTRAVENOUS | Status: AC
Start: 2014-07-07 — End: 2014-07-06
  Administered 2014-07-07: 03:00:00 via INTRAVENOUS

## 2014-07-07 MED ORDER — SODIUM CHLORIDE 0.9 % IV
INTRAVENOUS | Status: DC
Start: 2014-07-07 — End: 2014-07-09
  Administered 2014-07-07 – 2014-07-09 (×6): via INTRAVENOUS

## 2014-07-07 MED ORDER — SODIUM CHLORIDE 0.9 % IJ SYRG
INTRAMUSCULAR | Status: AC
Start: 2014-07-07 — End: 2014-07-07
  Administered 2014-07-07: 16:00:00

## 2014-07-07 MED ORDER — GLUCAGON 1 MG INJECTION
1 mg | INTRAMUSCULAR | Status: DC | PRN
Start: 2014-07-07 — End: 2014-07-09

## 2014-07-07 MED ORDER — DIPHENHYDRAMINE HCL 50 MG/ML IJ SOLN
50 mg/mL | INTRAMUSCULAR | Status: DC | PRN
Start: 2014-07-07 — End: 2014-07-09
  Administered 2014-07-07 – 2014-07-09 (×7): via INTRAVENOUS

## 2014-07-07 MED ORDER — SODIUM CHLORIDE 0.9 % IJ SYRG
INTRAMUSCULAR | Status: AC
Start: 2014-07-07 — End: 2014-07-08

## 2014-07-07 MED ORDER — HEPARIN (PORCINE) 5,000 UNIT/ML IJ SOLN
5000 unit/mL | Freq: Three times a day (TID) | INTRAMUSCULAR | Status: DC
Start: 2014-07-07 — End: 2014-07-09

## 2014-07-07 MED ORDER — MORPHINE 2 MG/ML INJECTION
2 mg/mL | INTRAMUSCULAR | Status: DC | PRN
Start: 2014-07-07 — End: 2014-07-09
  Administered 2014-07-07 – 2014-07-09 (×12): via INTRAVENOUS

## 2014-07-07 MED ORDER — ONDANSETRON (PF) 4 MG/2 ML INJECTION
4 mg/2 mL | Freq: Once | INTRAMUSCULAR | Status: AC
Start: 2014-07-07 — End: 2014-07-06
  Administered 2014-07-07: 02:00:00 via INTRAVENOUS

## 2014-07-07 MED ORDER — DIAZEPAM 5 MG/ML SYRINGE
5 mg/mL | INTRAMUSCULAR | Status: AC
Start: 2014-07-07 — End: 2014-07-06
  Administered 2014-07-07: 01:00:00 via INTRAVENOUS

## 2014-07-07 MED ORDER — SODIUM CHLORIDE 0.45 % IV
0.45 % | INTRAVENOUS | Status: DC
Start: 2014-07-07 — End: 2014-07-07

## 2014-07-07 MED ORDER — DEXTROSE 50% IN WATER (D50W) IV SYRG
INTRAVENOUS | Status: DC | PRN
Start: 2014-07-07 — End: 2014-07-09

## 2014-07-07 MED ORDER — MORPHINE 4 MG/ML SYRINGE
4 mg/mL | INTRAMUSCULAR | Status: AC
Start: 2014-07-07 — End: 2014-07-07
  Administered 2014-07-07: 05:00:00 via INTRAVENOUS

## 2014-07-07 MED ORDER — ONDANSETRON (PF) 4 MG/2 ML INJECTION
4 mg/2 mL | Freq: Once | INTRAMUSCULAR | Status: AC
Start: 2014-07-07 — End: 2014-07-06
  Administered 2014-07-07: 01:00:00 via INTRAVENOUS

## 2014-07-07 MED ORDER — DICYCLOMINE 10 MG CAP
10 mg | Freq: Four times a day (QID) | ORAL | Status: DC | PRN
Start: 2014-07-07 — End: 2014-07-09

## 2014-07-07 MED ORDER — CLONIDINE 0.2 MG/24 HR WEEKLY TRANSDERM PATCH
0.2 mg/24 hr | TRANSDERMAL | Status: DC
Start: 2014-07-07 — End: 2014-07-07
  Administered 2014-07-07: 18:00:00 via TRANSDERMAL

## 2014-07-07 MED ORDER — METOCLOPRAMIDE 5 MG/ML IJ SOLN
5 mg/mL | Freq: Four times a day (QID) | INTRAMUSCULAR | Status: DC
Start: 2014-07-07 — End: 2014-07-09
  Administered 2014-07-07 – 2014-07-09 (×9): via INTRAVENOUS

## 2014-07-07 MED ORDER — ONDANSETRON (PF) 4 MG/2 ML INJECTION
4 mg/2 mL | INTRAMUSCULAR | Status: DC | PRN
Start: 2014-07-07 — End: 2014-07-09
  Administered 2014-07-07 – 2014-07-08 (×3): via INTRAVENOUS

## 2014-07-07 MED ORDER — SODIUM CHLORIDE 0.9% BOLUS IV
0.9 % | INTRAVENOUS | Status: AC
Start: 2014-07-07 — End: 2014-07-06
  Administered 2014-07-07: 01:00:00 via INTRAVENOUS

## 2014-07-07 MED ORDER — SODIUM CHLORIDE 0.9 % IV
INTRAVENOUS | Status: AC
Start: 2014-07-07 — End: 2014-07-07
  Administered 2014-07-07: 06:00:00 via INTRAVENOUS

## 2014-07-07 MED ORDER — INSULIN LISPRO 100 UNIT/ML INJECTION
100 unit/mL | Freq: Four times a day (QID) | SUBCUTANEOUS | Status: DC
Start: 2014-07-07 — End: 2014-07-08
  Administered 2014-07-07 – 2014-07-08 (×2): via SUBCUTANEOUS

## 2014-07-07 MED ORDER — NALOXONE 0.4 MG/ML INJECTION
0.4 mg/mL | INTRAMUSCULAR | Status: DC | PRN
Start: 2014-07-07 — End: 2014-07-09

## 2014-07-07 MED ORDER — INSULIN GLARGINE 100 UNIT/ML INJECTION
100 unit/mL | Freq: Every evening | SUBCUTANEOUS | Status: DC
Start: 2014-07-07 — End: 2014-07-08
  Administered 2014-07-08: 03:00:00 via SUBCUTANEOUS

## 2014-07-07 MED FILL — SODIUM CHLORIDE 0.45 % IV: 0.45 % | INTRAVENOUS | Qty: 1000

## 2014-07-07 MED FILL — METOCLOPRAMIDE 5 MG/ML IJ SOLN: 5 mg/mL | INTRAMUSCULAR | Qty: 2

## 2014-07-07 MED FILL — DIPHENHYDRAMINE HCL 50 MG/ML IJ SOLN: 50 mg/mL | INTRAMUSCULAR | Qty: 1

## 2014-07-07 MED FILL — BD POSIFLUSH NORMAL SALINE 0.9 % INJECTION SYRINGE: INTRAMUSCULAR | Qty: 20

## 2014-07-07 MED FILL — ONDANSETRON (PF) 4 MG/2 ML INJECTION: 4 mg/2 mL | INTRAMUSCULAR | Qty: 2

## 2014-07-07 MED FILL — MORPHINE 2 MG/ML INJECTION: 2 mg/mL | INTRAMUSCULAR | Qty: 1

## 2014-07-07 MED FILL — DIAZEPAM 5 MG/ML SYRINGE: 5 mg/mL | INTRAMUSCULAR | Qty: 2

## 2014-07-07 MED FILL — BD POSIFLUSH NORMAL SALINE 0.9 % INJECTION SYRINGE: INTRAMUSCULAR | Qty: 10

## 2014-07-07 MED FILL — HYDRALAZINE 20 MG/ML IJ SOLN: 20 mg/mL | INTRAMUSCULAR | Qty: 1

## 2014-07-07 MED FILL — SODIUM CHLORIDE 0.9 % IV: INTRAVENOUS | Qty: 1000

## 2014-07-07 MED FILL — CLONIDINE 0.2 MG/24 HR WEEKLY TRANSDERM PATCH: 0.2 mg/24 hr | TRANSDERMAL | Qty: 1

## 2014-07-07 MED FILL — MORPHINE 4 MG/ML SYRINGE: 4 mg/mL | INTRAMUSCULAR | Qty: 1

## 2014-07-07 NOTE — Progress Notes (Signed)
Problem: Patient Education: Go to Patient Education Activity  Goal: Patient/Family Education  Outcome: Progressing Towards Goal  Discussed signs symptoms of hypoglycemia and instructed to notify staff

## 2014-07-07 NOTE — Progress Notes (Signed)
NUTRITION RECOMMENDATIONS:   1. 1800 calorie 6 small meals when medically able  2. Rec. resource diabetishield tid while on cl liq diet    NUTRITION INITIAL EVALUATION    NUTRITION ASSESSMENT:       Reason for assessment: rn screen    Admitting diagnosis: intractable vomiting, DM, HTN      PMH:   Past Medical History   Diagnosis Date   ??? Diabetes (HCC)    ??? Gastroparesis    ??? Gastroparesis    ??? Hypertension         Anthropometrics:  Height: Ht Readings from Last 3 Encounters:   07/06/14 5\' 6"  (1.676 m)   06/18/14 5\' 6"  (1.676 m)   05/29/14 5\' 6"  (1.676 m) ??       Weight: Wt Readings from Last 3 Encounters:   07/06/14 54.432 kg (120 lb)   06/18/14 54.885 kg (121 lb)   05/29/14 50.576 kg (111 lb 8 oz) ??       ?? IBW: 65kg     ?? % IBW: 83%    ?? BMI: Body mass index is 19.38 kg/(m^2).    ?? UBW: 59kg    ?? Wt change: 8% wt loss within 2 months time.   Diet and intake history:  ?? Current diet order: DIET CLEAR LIQUID    ?? Food allergies: none    ?? Diet/intake history: dm diet / gatroparesis diet    [ ]  <50% intake x >5 days  [ ]  <50% intake x >1 month  [ ]  <75% intake x >7 days  [x]  <75% intake x 1 month  [ ]  <75% intake x 3 months    ?? Current appetite/PO intake: poor due to n/v      Assessment of current MNT: Cl liq diet not adequate for meeting nutritional needs    ?? Cultural, religious, and ethnic food preferences identified: none    Physical Assessment:  ?? GI symptoms: n/v    ?? Chewing/swallowing issues: none    ?? Skin integrity: intact    ?? Muscle wasting:  none    ?? Fluid accumulation: none    ?? Mental status: A & O x3     Intake and output:    Intake/Output Summary (Last 24 hours) at 07/07/14 1206  Last data filed at 07/07/14 0222   Gross per 24 hour   Intake    800 ml   Output      0 ml   Net    800 ml              Living situation: family    Current pertinent medications: insulin, zofran    Pertinent labs: glu 238 (insulin coverage), Hgb A1c 8.3 (uncontrolled)     Does patient meet malnutrition criteria: Moderate Chronic PCM    NUTRITION DIAGNOSIS:     1. 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    NUTRITION INTERVENTION:     Recommended diet: 1800 calorie 6 small meals when medically able    Recommended nutrition supplement:  Resource diabetashield tid while on clear liquid diet     NUTRITION MONITORING AND EVALUATION:     Nutrition level of care: moderate    Nutrition monitoring: PO intake, diet tolerance and compliance, wt, BS, CMP, hydration, medical changes      Nutrition goals: PO intake >75% without n/v  NUTRITION EDUCATION:     Comments: Declines education at this time. Pt states she follows dm diet & also eats small meals low in sugar & fat due to gastroparesis hx.    Lady Saucier SNEDDON, RD  07/07/2014

## 2014-07-07 NOTE — ED Notes (Signed)
TRANSFER - OUT REPORT:    Verbal report given to Jessica, RN(name) on Raylynne S Gunnerson  being transferred to 4205(unit) for routine progression of care       Report consisted of patient???s Situation, Background, Assessment and   Recommendations(SBAR).     Information from the following report(s) SBAR, ED Summary, [facility] and Recent Results was reviewed with the receiving nurse.    Lines:   Peripheral IV 07/06/14 Left Hand (Active)   Site Assessment Clean, dry, & intact 07/06/2014  7:35 PM   Phlebitis Assessment 0 07/06/2014  7:35 PM   Infiltration Assessment 0 07/06/2014  7:35 PM   Dressing Status Clean, dry, & intact 07/06/2014  7:35 PM   Dressing Type Transparent 07/06/2014  7:35 PM   Hub Color/Line Status Flushed;Patent;Capped 07/06/2014  7:35 PM   Action Taken Blood drawn 07/06/2014  7:25 PM   Alcohol Cap Used Yes 07/06/2014  7:35 PM        Opportunity for questions and clarification was provided.      Patient transported with:   Registered Nurse

## 2014-07-07 NOTE — Progress Notes (Signed)
ADULT MALNUTRITION GUIDELINES    Patient is a 29 y.o. female, admitted on 07/06/2014 with a diagnosis of intractable vomiting, DM, HTN.  Nutrition assessment was completed by RD and the patient was found to meet the following malnutrition criteria established by ASPEN/AND:    Adult Malnutrition Guidelines:  MALNUTRITION OF MODERATE DEGREE - NON-SEVERE MALNUTRITION IN THE CONTEXT OF CHRONIC ILLNESS  Weight Loss: 5% x 1 month, 7.5% x 3 months, 10% x 6 months, 20% x 12 months  Energy Intake: <75% energy intake compared to estimated energy needs >1 month    Intervention:  1800 calories 6 small meals with diabetashield tid    Please document MALNUTRITION in Problem List if in agreement.    Lady Saucier SNEDDON, RD  07/07/2014

## 2014-07-07 NOTE — Other (Signed)
----------  DocumentID:   [id number]------------------------------------------------              [facility]                       Patient Education Report         Name: [name] [name], [name] [name]                  Date: 07/07/2014    MRN: [id number]                    Time: 1:08:18 AM         Patient ordered video: Patient Safety: Stay Safe While you are in the   Hospital    from [id number] via phone number: [phone] at 1:08:18 AM

## 2014-07-07 NOTE — H&P (Signed)
Medicine History and Physical    Patient: [name] [name]   Age:  29 y.o.    Assessment   Active Problems:    * No active hospital problems. *        Plan     Diabetic gastroparesis  -Reglan Iv Q6  -IVF  -antiemetics and analgesia    DMI  -Levemir insulin  -ISS  -Check Hg A1C  -Fingerstick glucose Ac/Hs  -Diabetic diet     Chronic Anemia of chronic Dz  -2/2 poor;y controlled DMI    Hyponatremia  -2/2 N/V and dehydration  -IVF w/ NS     DVT PPx  -heparin     DISPO  -Pt to be admitted  at this time for reasons addressed above, continued hospitalization for ongoing assessment and treatment indicated       Chief Complaint:   Chief Complaint   Patient presents with   ??? Vomiting   ??? Abdominal Pain         HPI:   [name] [name] is a 29 y.o. year old female who presents with abd pain, N/V started yesterday AM, went to bed in [facility]. Upon awaking has had multiple episodes on NB/NB emisis Sx are similar to previous episodes of DMII gastroparesis. Last BM 2 days ago, + flatus       Review of Systems - positive responses in bold type   Constitutional: Negative for fever, chills, diaphoresis and unexpected weight change.   HENT: Negative for ear pain, congestion, sore throat, rhinorrhea, drooling, trouble swallowing, neck pain and tinnitus.   Eyes: Negative for photophobia, pain, redness and visual disturbance.   Respiratory: negative for shortness of breath, cough, choking, chest tightness, wheezing or stridor.   Cardiovascular: Negative for chest pain, palpitations and leg swelling.   Gastrointestinal: Negative for nausea, vomiting, abdominal pain, diarrhea, constipation, blood in stool, abdominal distention and anal bleeding.   Genitourinary: Negative for dysuria, urgency, frequency, hematuria, flank pain and difficulty urinating.   Musculoskeletal: Negative for back pain and arthralgias.   Skin: Negative for color change, rash and wound.   Neurological: Negative for dizziness, seizures, syncope, speech  difficulty, light-headedness or headaches.   Hematological: Does not bruise/bleed easily.   Psychiatric/Behavioral: Negative for suicidal ideas, hallucinations, behavioral problems, self-injury or agitation       Past Medical History:  Past Medical History   Diagnosis Date   ??? Diabetes (HCC)    ??? Gastroparesis    ??? Gastroparesis    ??? Hypertension        Past Surgical History:  Past Surgical History   Procedure Laterality Date   ??? Hx cesarean section         Family History:  History reviewed. No pertinent family history.    Social History:  History     Social History   ??? Marital Status: SINGLE     Spouse Name: N/A   ??? Number of Children: N/A   ??? Years of Education: N/A     Social History Main Topics   ??? Smoking status: Never Smoker    ??? Smokeless tobacco: Not on file   ??? Alcohol Use: No   ??? Drug Use: No   ??? Sexual Activity: Yes     Birth Control/ Protection: Injection     Other Topics Concern   ??? None     Social History Narrative       Home Medications:  Prior to Admission medications    Medication Sig Start Date  End Date Taking? Authorizing Provider   dicyclomine (BENTYL) 20 mg tablet Take 20 mg by mouth four (4) times daily as needed for Pain.   Yes Historical Provider   oxyCODONE-acetaminophen (PERCOCET) 5-325 mg per tablet Take 1 Tab by mouth every four (4) hours as needed. Max Daily Amount: 6 Tabs. 06/04/14   Theodis Aguas, MD   promethazine (PHENERGAN) 12.5 mg tablet Take 1 Tab by mouth every eight (8) hours as needed for Nausea. 06/04/14   Theodis Aguas, MD   insulin glargine (LANTUS SOLOSTAR) 100 unit/mL (3 mL) pen 25 Units by SubCUTAneous route nightly. 05/04/14   Faythe Ghee, DO   lisinopril (PRINIVIL, ZESTRIL) 20 mg tablet Take 20 mg by mouth daily.    Karolee Ohs, PA   metoclopramide HCl (REGLAN) 5 mg/5 mL syrup Take 1 tsp by mouth Before breakfast, lunch, and dinner.    Jerral Bonito, MD   insulin glulisine (APIDRA SOLOSTAR) 100 unit/mL pen 5 Units by  SubCUTAneous route Before breakfast, lunch, and dinner. Indications: TYPE 1 DIABETES MELLITUS, pt states this is her sliding scale insulin    Jacob Moores, MD       Allergies:  Allergies   Allergen Reactions   ??? Penicillins Hives   ??? Zithromax [Azithromycin] Hives           Physical Exam:     Visit Vitals   Item Reading   ??? BP 142/57 mmHg   ??? Pulse 72   ??? Temp 99 ??F (37.2 ??C)   ??? Resp 18   ??? Ht 5\' 6"  (1.676 m)   ??? Wt 54.432 kg (120 lb)   ??? BMI 19.38 kg/m2   ??? SpO2 100%   ??? Breastfeeding Unknown       Physical Exam:  General appearance: alert, cooperative, no distress, appears stated age  Head: Normocephalic, without obvious abnormality, atraumatic  Neck: supple, trachea midline  Lungs: clear to auscultation bilaterally  Heart: regular rate and rhythm, S1, S2 normal, no murmur, click, rub or gallop  Abdomen: soft, mild diffuse tendernessr. Bowel sounds decreased l. No masses,  no organomegaly  Extremities: extremities normal, atraumatic, no cyanosis or edema  Skin: Skin color, texture, turgor normal. No rashes or lesions  Neurologic: Grossly normal  Lymph: no palpable LAD in cervical or axillary region  PSY: mood and affect normal, appropriately behaved     Intake and Output:  Current Shift:  06/10 0701 - 06/10 1900  In: -   Out: 50   Last three shifts:  06/08 1901 - 06/10 0700  In: 800 [I.V.:800]  Out: -     Lab/Data Reviewed:  All lab results for the last 24 hours reviewed.      Kristie Cowman, MD  [phone]# [phone]  July 07, 2014  \\

## 2014-07-07 NOTE — Other (Signed)
Message sent to on call MD r/ t need for pain meds. Marland Kitchen

## 2014-07-07 NOTE — Other (Signed)
TRANSFER - IN REPORT:    Bedside report received from [facility] RN(name) on Thera Flake  being received from [facility](unit).      Report consisted of patient???s Situation, Background, Assessment and   Recommendations(SBAR).     Information from the following report(s) SBAR, Intake/Output, MAR, Recent Results and Med Rec Status was reviewed with the receiving nurse.    Opportunity for questions and clarification was provided.      Assessment completed upon patient???s arrival to unit and care assumed.

## 2014-07-07 NOTE — Progress Notes (Signed)
D/C Plan Home - No anticipated needs at this time     Care Management Interventions  PCP Verified by CM: Yes  Last Visit to PCP: 06/06/14  Mode of Transport at Discharge:  (Mom)  MyChart Signup: No  Discharge Durable Medical Equipment: No  Physical Therapy Consult: No  Occupational Therapy Consult: No  Speech Therapy Consult: No  Current Support Network: Relative's Home (mother's home )  Confirm Follow Up Transport: Family  Confirm Transport and Arrange: Yes  Plan discussed with Pt/Family/Caregiver: Yes  Discharge Location  Discharge Placement: Home with family assistance

## 2014-07-07 NOTE — Progress Notes (Signed)
Message sent to dr Robin Searing. BP -186/105 and FSBS=230.

## 2014-07-07 NOTE — Other (Signed)
Bedside and Verbal shift change report given to a spellman (oncoming nurse) by s , cribb (offgoing nurse). Report included the following information Kardex, Intake/Output, MAR and Accordion.

## 2014-07-07 NOTE — Other (Signed)
Pt am b in hall . Gait steady.

## 2014-07-08 LAB — CBC WITH AUTOMATED DIFF
BASOPHILS: 0.8 % (ref 0–3)
EOSINOPHILS: 2.2 % (ref 0–5)
HCT: 31.3 % — ABNORMAL LOW (ref 37.0–50.0)
HGB: 10 gm/dl — ABNORMAL LOW (ref 13.0–17.2)
IMMATURE GRANULOCYTES: 0.1 % (ref 0.0–3.0)
LYMPHOCYTES: 42.9 % (ref 28–48)
MCH: 28.1 pg (ref 25.4–34.6)
MCHC: 31.9 gm/dl (ref 30.0–36.0)
MCV: 87.9 fL (ref 80.0–98.0)
MONOCYTES: 5.4 % (ref 1–13)
MPV: 11.1 fL — ABNORMAL HIGH (ref 6.0–10.0)
NEUTROPHILS: 48.6 % (ref 34–64)
NRBC: 0 (ref 0–0)
PLATELET: 208 10*3/uL (ref 140–450)
RBC: 3.56 M/uL — ABNORMAL LOW (ref 3.60–5.20)
RDW-SD: 42.5 (ref 36.4–46.3)
WBC: 8.8 10*3/uL (ref 4.0–11.0)

## 2014-07-08 LAB — MAGNESIUM: Magnesium: 1.9 mg/dl (ref 1.8–2.4)

## 2014-07-08 LAB — RENAL FUNCTION PANEL
Albumin: 2.8 gm/dl — ABNORMAL LOW (ref 3.4–5.0)
BUN: 10 mg/dl (ref 7–25)
CO2: 25 mEq/L (ref 21–32)
Calcium: 7.8 mg/dl — ABNORMAL LOW (ref 8.5–10.1)
Chloride: 108 mEq/L — ABNORMAL HIGH (ref 98–107)
Creatinine: 0.8 mg/dl (ref 0.6–1.3)
GFR est AA: >60
GFR est non-AA: >60
Glucose: 48 mg/dl — CL (ref 74–106)
Phosphorus: 3.5 mg/dl (ref 2.5–4.9)
Potassium: 3.5 mEq/L (ref 3.5–5.1)
Sodium: 141 mEq/L (ref 136–145)

## 2014-07-08 LAB — GLUCOSE, POC
Glucose (POC): 252 mg/dL — ABNORMAL HIGH (ref 65–105)
Glucose (POC): 73 mg/dL (ref 65–105)
Glucose (POC): 163 mg/dL — ABNORMAL HIGH (ref 65–105)
Glucose (POC): 224 mg/dL — ABNORMAL HIGH (ref 65–105)

## 2014-07-08 MED ORDER — ACETAMINOPHEN 325 MG TABLET
325 mg | Freq: Four times a day (QID) | ORAL | Status: DC | PRN
Start: 2014-07-08 — End: 2014-07-09
  Administered 2014-07-08: 13:00:00 via ORAL

## 2014-07-08 MED ORDER — INSULIN GLARGINE 100 UNIT/ML INJECTION
100 unit/mL | Freq: Every evening | SUBCUTANEOUS | Status: DC
Start: 2014-07-08 — End: 2014-07-09

## 2014-07-08 MED ORDER — METOPROLOL SUCCINATE SR 50 MG 24 HR TAB
50 mg | Freq: Every day | ORAL | Status: DC
Start: 2014-07-08 — End: 2014-07-09
  Administered 2014-07-08 – 2014-07-09 (×2): via ORAL

## 2014-07-08 MED ORDER — PROMETHAZINE IN NS 12.5 MG/50 ML IV PIGGY BAG
12.5 mg/50 ml | Freq: Four times a day (QID) | INTRAVENOUS | Status: DC | PRN
Start: 2014-07-08 — End: 2014-07-09
  Administered 2014-07-09: via INTRAVENOUS

## 2014-07-08 MED ORDER — INSULIN LISPRO 100 UNIT/ML INJECTION
100 unit/mL | Freq: Three times a day (TID) | SUBCUTANEOUS | Status: DC
Start: 2014-07-08 — End: 2014-07-09
  Administered 2014-07-08 – 2014-07-09 (×2): via SUBCUTANEOUS

## 2014-07-08 MED ORDER — SODIUM CHLORIDE 0.9 % IJ SYRG
INTRAMUSCULAR | Status: AC
Start: 2014-07-08 — End: 2014-07-09

## 2014-07-08 MED ORDER — HYDRALAZINE 20 MG/ML IJ SOLN
20 mg/mL | Freq: Once | INTRAMUSCULAR | Status: AC
Start: 2014-07-08 — End: 2014-07-08
  Administered 2014-07-08: 19:00:00 via INTRAVENOUS

## 2014-07-08 MED ORDER — METOPROLOL SUCCINATE SR 50 MG 24 HR TAB
50 mg | Freq: Every day | ORAL | Status: DC
Start: 2014-07-08 — End: 2014-07-08

## 2014-07-08 MED FILL — MORPHINE 2 MG/ML INJECTION: 2 mg/mL | INTRAMUSCULAR | Qty: 1

## 2014-07-08 MED FILL — METOPROLOL SUCCINATE SR 50 MG 24 HR TAB: 50 mg | ORAL | Qty: 1

## 2014-07-08 MED FILL — HYDRALAZINE 20 MG/ML IJ SOLN: 20 mg/mL | INTRAMUSCULAR | Qty: 1

## 2014-07-08 MED FILL — METOCLOPRAMIDE 5 MG/ML IJ SOLN: 5 mg/mL | INTRAMUSCULAR | Qty: 2

## 2014-07-08 MED FILL — ONDANSETRON (PF) 4 MG/2 ML INJECTION: 4 mg/2 mL | INTRAMUSCULAR | Qty: 2

## 2014-07-08 MED FILL — INSULIN GLARGINE 100 UNIT/ML INJECTION: 100 unit/mL | SUBCUTANEOUS | Qty: 1

## 2014-07-08 MED FILL — INSULIN LISPRO 100 UNIT/ML INJECTION: 100 unit/mL | SUBCUTANEOUS | Qty: 1

## 2014-07-08 MED FILL — PROMETHAZINE IN NS 12.5 MG/50 ML IV PIGGY BAG: 12.5 mg/50 ml | INTRAVENOUS | Qty: 50

## 2014-07-08 MED FILL — BD POSIFLUSH NORMAL SALINE 0.9 % INJECTION SYRINGE: INTRAMUSCULAR | Qty: 10

## 2014-07-08 MED FILL — DIPHENHYDRAMINE HCL 50 MG/ML IJ SOLN: 50 mg/mL | INTRAMUSCULAR | Qty: 1

## 2014-07-08 MED FILL — ACETAMINOPHEN 325 MG TABLET: 325 mg | ORAL | Qty: 2

## 2014-07-08 MED FILL — HEPARIN (PORCINE) 5,000 UNIT/ML IJ SOLN: 5000 unit/mL | INTRAMUSCULAR | Qty: 1

## 2014-07-08 NOTE — Other (Signed)
IV infiltrated.  PICC nurse paged to place another one.

## 2014-07-08 NOTE — Other (Signed)
receicived report reviewed sbar/mar drinking ginger ale will recheck blood sugar

## 2014-07-08 NOTE — Progress Notes (Signed)
Explained to patient medication given to decrease blood pressure plan to continue to assess blood pressure and patient instructed to remain in bed

## 2014-07-08 NOTE — Progress Notes (Signed)
Progress Notes  Patient: [name] [name]   Age:  29 y.o.      Assessment/Plan     Diabetic gastroparesis  -still not tolerating po  -continue Reglan Iv Q6  -IVF  -antiemetics and analgesia, limit narcotics as able  -continue clears for now    DMI  -Lantus insulin, will decrease dose as hypoglycemic this am  -ISS  -Fingerstick glucose Ac/Hs  -Diabetic diet     Chronic Anemia of chronic Dz  -2/2 poor;y controlled DMI, overall H/H stable    Hyponatremia  -resolved    DVT PPx  -heparin     DISPO  -pending improvement of N/V and ability to tolerate po intke    Subjective:   She continues to feel nauseated.  She has thrown up once this am and hasn't been able to tolerate any po intake. No fevers, but has chills, minimal abd pain.  Has a headache this am.  Morphine helping with her headache.        Physical Exam:     Visit Vitals   Item Reading   ??? BP 188/99 mmHg   ??? Pulse 97   ??? Temp 98 ??F (36.7 ??C)   ??? Resp 20   ??? Ht  (1.676 m)   ??? Wt 54.432 kg (120 lb)   ??? BMI 19.38 kg/m2   ??? SpO2 100%   ??? Breastfeeding Unknown       Physical Exam:  General appearance: alert, cooperative, mild distress from nausea  Neck: supple, trachea midline  Lungs: clear to auscultation bilaterally  Heart: regular rate and rhythm, S1, S2 normal, no murmur, click, rub or gallop  Abdomen: soft, mild diffuse tenderness Bowel sounds decreased   Extremities: extremities normal, atraumatic, no cyanosis or edema     Lab/Data Reviewed:  Recent Results (from the past 12 hour(s))   CBC WITH AUTOMATED DIFF    Collection Time: 07/08/14  5:15 AM   Result Value Ref Range    WBC 8.8 4.0 - 11.0 1000/mm3    RBC 3.56 (L) 3.60 - 5.20 M/uL    HGB 10.0 (L) 13.0 - 17.2 gm/dl    HCT 16.1 (L) 09.6 - 50.0 %    MCV 87.9 80.0 - 98.0 fL    MCH 28.1 25.4 - 34.6 pg    MCHC 31.9 30.0 - 36.0 gm/dl    PLATELET 045 409 - 811 1000/mm3    MPV 11.1 (H) 6.0 - 10.0 fL    RDW-SD 42.5 36.4 - 46.3      NRBC 0 0 - 0      IMMATURE GRANULOCYTES 0.1 0.0 - 3.0 %     NEUTROPHILS 48.6 34 - 64 %    LYMPHOCYTES 42.9 28 - 48 %    MONOCYTES 5.4 1 - 13 %    EOSINOPHILS 2.2 0 - 5 %    BASOPHILS 0.8 0 - 3 %   MAGNESIUM    Collection Time: 07/08/14  5:15 AM   Result Value Ref Range    Magnesium 1.9 1.8 - 2.4 mg/dl   RENAL FUNCTION PANEL    Collection Time: 07/08/14  5:15 AM   Result Value Ref Range    Sodium 141 136 - 145 mEq/L    Potassium 3.5 3.5 - 5.1 mEq/L    Chloride 108 (H) 98 - 107 mEq/L    CO2 25 21 - 32 mEq/L    Glucose 48 (LL) 74 - 106 mg/dl    BUN 10 7 -  25 mg/dl    Creatinine 0.8 0.6 - 1.3 mg/dl    GFR est AA >50.0      GFR est non-AA >60      Calcium 7.8 (L) 8.5 - 10.1 mg/dl    Albumin 2.8 (L) 3.4 - 5.0 gm/dl    Phosphorus 3.5 2.5 - 4.9 mg/dl   GLUCOSE, POC    Collection Time: 07/08/14  7:59 AM   Result Value Ref Range    Glucose (POC) 73 65 - 105 mg/dL       Kirkland Hun, DO    July 08, 2014

## 2014-07-08 NOTE — Other (Signed)
Bedside, Verbal and Written shift change report given to Raynelle Jan RN (oncoming nurse) by Rosalio Loud RN (offgoing nurse). Report included the following information SBAR, Kardex and MAR.

## 2014-07-08 NOTE — Other (Signed)
Bedside and Verbal shift change report given to Monica A Josephs, RN   (oncoming nurse) by Denise Koziol, RN (offgoing nurse). Report included the following information SBAR, Intake/Output, MAR and Recent Results.

## 2014-07-08 NOTE — Progress Notes (Signed)
Paged [facility] doctor germscheld informed of blood sugar and blood pressure prn dose of hydralizine given

## 2014-07-08 NOTE — Progress Notes (Signed)
Patient report feels better and headache has decreased but patient vomitted

## 2014-07-08 NOTE — Progress Notes (Signed)
Return called back and aware of current progress

## 2014-07-08 NOTE — Progress Notes (Signed)
Patient report feels nauseous even after zofran sent meassage to doctor dr germscheid

## 2014-07-08 NOTE — Progress Notes (Signed)
Called d rgermscheid to report elavate4d b/p and heartrate and received order to repeat hydralizine  x1 now

## 2014-07-08 NOTE — Progress Notes (Signed)
Problem: Patient Education: Go to Patient Education Activity  Goal: Patient/Family Education  Outcome: Progressing Towards Goal  Understanding how to check blood sugar and symptons of high and low blood sugar and diet controls

## 2014-07-08 NOTE — Progress Notes (Signed)
Called dr germscheid and reported headache and current b/p and blood sugar at this time to hold coverage due to being low this am and received higher dose of lantus and doctor plans to order medications for blood pressure will explain to patient

## 2014-07-09 LAB — GLUCOSE, POC
Glucose (POC): 82 mg/dL (ref 65–105)
Glucose (POC): 65 mg/dL (ref 65–105)
Glucose (POC): 161 mg/dL — ABNORMAL HIGH (ref 65–105)

## 2014-07-09 MED ORDER — DICYCLOMINE 10 MG CAP
10 mg | Freq: Three times a day (TID) | ORAL | Status: DC
Start: 2014-07-09 — End: 2014-07-09
  Administered 2014-07-09: 13:00:00 via ORAL

## 2014-07-09 MED ORDER — SODIUM CHLORIDE 0.45 % IV
0.45 % | INTRAVENOUS | Status: DC
Start: 2014-07-09 — End: 2014-07-09
  Administered 2014-07-09: 13:00:00 via INTRAVENOUS

## 2014-07-09 MED ORDER — SODIUM CHLORIDE 0.9 % IJ SYRG
INTRAMUSCULAR | Status: AC
Start: 2014-07-09 — End: 2014-07-09
  Administered 2014-07-09: 05:00:00

## 2014-07-09 MED ORDER — SODIUM CHLORIDE 0.9 % IJ SYRG
INTRAMUSCULAR | Status: AC
Start: 2014-07-09 — End: 2014-07-08
  Administered 2014-07-09

## 2014-07-09 MED FILL — MORPHINE 2 MG/ML INJECTION: 2 mg/mL | INTRAMUSCULAR | Qty: 1

## 2014-07-09 MED FILL — METOCLOPRAMIDE 5 MG/ML IJ SOLN: 5 mg/mL | INTRAMUSCULAR | Qty: 2

## 2014-07-09 MED FILL — DIPHENHYDRAMINE HCL 50 MG/ML IJ SOLN: 50 mg/mL | INTRAMUSCULAR | Qty: 1

## 2014-07-09 MED FILL — METOPROLOL SUCCINATE SR 50 MG 24 HR TAB: 50 mg | ORAL | Qty: 1

## 2014-07-09 MED FILL — HEPARIN (PORCINE) 5,000 UNIT/ML IJ SOLN: 5000 unit/mL | INTRAMUSCULAR | Qty: 1

## 2014-07-09 MED FILL — INSULIN GLARGINE 100 UNIT/ML INJECTION: 100 unit/mL | SUBCUTANEOUS | Qty: 1

## 2014-07-09 MED FILL — BD POSIFLUSH NORMAL SALINE 0.9 % INJECTION SYRINGE: INTRAMUSCULAR | Qty: 10

## 2014-07-09 MED FILL — DICYCLOMINE 10 MG CAP: 10 mg | ORAL | Qty: 2

## 2014-07-09 MED FILL — SODIUM CHLORIDE 0.45 % IV: 0.45 % | INTRAVENOUS | Qty: 1000

## 2014-07-09 MED FILL — BD POSIFLUSH NORMAL SALINE 0.9 % INJECTION SYRINGE: INTRAMUSCULAR | Qty: 20

## 2014-07-09 NOTE — Progress Notes (Signed)
Problem: Falls - Risk of  Goal: *Absence of falls  Outcome: Progressing Towards Goal  No falls at this time.

## 2014-07-09 NOTE — Progress Notes (Signed)
Called to dr germscheld that patient wishes to leave due to sick dtr and wants to go home doctor advise wants to see her and patient signed out ama

## 2014-07-09 NOTE — Discharge Summary (Signed)
Discharge Summary    Patient: [name] [name] MRN: [id number]  CSN: [id number]    Date of Birth: 03-22-1985  Age: 29 y.o.  Sex: female    DOA: 07/06/2014 LOS:    Discharge Date: 07/09/2014       Admission Diagnoses: intractable vomiting, DM, HTN    Discharge Diagnoses:    Problem List as of 07/09/2014  Never Reviewed          Codes Class Noted - Resolved    Intractable nausea and vomiting ICD-10-CM: R11.2  ICD-9-CM: 536.2  06/02/2014 - Present        Diabetic ketoacidosis (HCC) ICD-10-CM: E13.10  ICD-9-CM: 250.10  05/03/2014 - Present        DKA (diabetic ketoacidoses) (HCC) ICD-10-CM: E13.10  ICD-9-CM: 250.10  04/05/2014 - Present              Allergies:   Allergies   Allergen Reactions   ??? Penicillins Hives   ??? Zithromax [Azithromycin] Hives         Consults: None    Hospital Course: Pt presented to the ED with abdominal pain with intractable N/V.  She has a long hx of poor controlled DM.  She has had multiple admission for gastroparesis.  She was admitted, started on IVF, antiemetics including reglan.  She improved slowly and progressed to a clear liquid diet.  On day of discharge she ahd a family emergency and decided to sign out AMA though her N/V and abd pain had not completely improved. She left prior to being seen by physician    Significant Diagnostic Studies:None  Discharge Medications:     Prior to Admission Medications   Prescriptions Last Dose Informant Patient Reported? Taking?   dicyclomine (BENTYL) 20 mg tablet   Yes Yes   Sig: Take 20 mg by mouth four (4) times daily as needed for Pain.   insulin glargine (LANTUS SOLOSTAR) 100 unit/mL (3 mL) pen   No No   Sig: 25 Units by SubCUTAneous route nightly.   insulin glulisine (APIDRA SOLOSTAR) 100 unit/mL pen   Yes No   Sig: 5 Units by SubCUTAneous route Before breakfast, lunch, and dinner. Indications: TYPE 1 DIABETES MELLITUS, pt states this is her sliding scale insulin   lisinopril (PRINIVIL, ZESTRIL) 20 mg tablet   Yes No   Sig: Take 20 mg by mouth daily.    metoclopramide HCl (REGLAN) 5 mg/5 mL syrup   Yes No   Sig: Take 1 tsp by mouth Before breakfast, lunch, and dinner.   oxyCODONE-acetaminophen (PERCOCET) 5-325 mg per tablet Not Taking at Unknown time  No No   Sig: Take 1 Tab by mouth every four (4) hours as needed. Max Daily Amount: 6 Tabs.   promethazine (PHENERGAN) 12.5 mg tablet Not Taking at Unknown time  No No   Sig: Take 1 Tab by mouth every eight (8) hours as needed for Nausea.      Facility-Administered Medications: None       Discharge Labs:  Recent Results (from the past 12 hour(s))   GLUCOSE, POC    Collection Time: 07/09/14  8:10 AM   Result Value Ref Range    Glucose (POC) 65 65 - 105 mg/dL   GLUCOSE, POC    Collection Time: 07/09/14 11:32 AM   Result Value Ref Range    Glucose (POC) 161 (H) 65 - 105 mg/dL       Discharge Vitals:  Visit Vitals   Item Reading   ??? BP 141/81  mmHg   ??? Pulse 93   ??? Temp 98.1 ??F (36.7 ??C)   ??? Resp 16   ??? Ht  (1.676 m)   ??? Wt 54.432 kg (120 lb)   ??? BMI 19.38 kg/m2   ??? SpO2 100%   ??? Breastfeeding Unknown       Follow-up: With PCP in 1 week    Kirkland Hun, DO  07/09/2014, 3:55 PM

## 2014-10-11 ENCOUNTER — Inpatient Hospital Stay
Admit: 2014-10-11 | Discharge: 2014-10-11 | Disposition: A | Discharge status: Home / Self Care | Payer: MEDICAID | Attending: Emergency Medicine

## 2014-10-11 DIAGNOSIS — E1143 Type 2 diabetes mellitus with diabetic autonomic (poly)neuropathy: Secondary | ICD-10-CM

## 2014-10-11 LAB — METABOLIC PANEL, COMPREHENSIVE
ALT (SGPT): 13 U/L (ref 12–78)
AST (SGOT): 22 U/L (ref 15–37)
Albumin: 3.3 gm/dl — ABNORMAL LOW (ref 3.4–5.0)
Alk. phosphatase: 141 U/L — ABNORMAL HIGH (ref 45–117)
BUN: 15 mg/dl (ref 7–25)
Bilirubin, total: 0.7 mg/dl (ref 0.2–1.0)
CO2: 20 mEq/L — ABNORMAL LOW (ref 21–32)
Calcium: 8.7 mg/dl (ref 8.5–10.1)
Chloride: 103 mEq/L (ref 98–107)
Creatinine: 1 mg/dl (ref 0.6–1.3)
GFR est AA: >60
GFR est non-AA: >60
Glucose: 357 mg/dl — ABNORMAL HIGH (ref 74–106)
Potassium: 3.7 mEq/L (ref 3.5–5.1)
Protein, total: 7.9 gm/dl (ref 6.4–8.2)
Sodium: 136 mEq/L (ref 136–145)

## 2014-10-11 LAB — POC URINE MACROSCOPIC
Bilirubin: NEGATIVE
Glucose: >=1000 mg/dl — AB
Ketone: 40 mg/dl — AB
Leukocyte Esterase: NEGATIVE
Nitrites: NEGATIVE
Protein: 100 mg/dl — AB
Specific gravity: >=1.03 (ref 1.005–1.030)
Urobilinogen: 0.2 EU/dl (ref 0.0–1.0)
pH (UA): 7 (ref 5–9)

## 2014-10-11 LAB — CBC WITH AUTOMATED DIFF
BASOPHILS: 0.5 % (ref 0–3)
EOSINOPHILS: 0 % (ref 0–5)
HCT: 37.1 % (ref 37.0–50.0)
HGB: 12 gm/dl — ABNORMAL LOW (ref 13.0–17.2)
IMMATURE GRANULOCYTES: 0.2 % (ref 0.0–3.0)
LYMPHOCYTES: 18.1 % — ABNORMAL LOW (ref 28–48)
MCH: 26.4 pg (ref 25.4–34.6)
MCHC: 32.3 gm/dl (ref 30.0–36.0)
MCV: 81.5 fL (ref 80.0–98.0)
MONOCYTES: 2.7 % (ref 1–13)
MPV: 11.6 fL — ABNORMAL HIGH (ref 6.0–10.0)
NEUTROPHILS: 78.5 % — ABNORMAL HIGH (ref 34–64)
NRBC: 0 (ref 0–0)
PLATELET: 270 10*3/uL (ref 140–450)
RBC: 4.55 M/uL (ref 3.60–5.20)
RDW-SD: 38.1 (ref 36.4–46.3)
WBC: 10.5 10*3/uL (ref 4.0–11.0)

## 2014-10-11 LAB — POC HCG,URINE: HCG urine, QL: NEGATIVE

## 2014-10-11 LAB — LIPASE: Lipase: 95 U/L (ref 73–393)

## 2014-10-11 MED ORDER — METOCLOPRAMIDE 5 MG/ML IJ SOLN
5 mg/mL | INTRAMUSCULAR | Status: AC
Start: 2014-10-11 — End: 2014-10-11
  Administered 2014-10-11: 06:00:00 via INTRAVENOUS

## 2014-10-11 MED ORDER — DIPHENHYDRAMINE HCL 50 MG/ML IJ SOLN
50 mg/mL | INTRAMUSCULAR | Status: AC
Start: 2014-10-11 — End: 2014-10-11
  Administered 2014-10-11: 06:00:00 via INTRAVENOUS

## 2014-10-11 MED ORDER — LORAZEPAM 2 MG/ML IJ SOLN
2 mg/mL | Freq: Once | INTRAMUSCULAR | Status: AC
Start: 2014-10-11 — End: 2014-10-11
  Administered 2014-10-11: 09:00:00 via INTRAVENOUS

## 2014-10-11 MED ORDER — SODIUM CHLORIDE 0.9% BOLUS IV
0.9 % | INTRAVENOUS | Status: AC
Start: 2014-10-11 — End: 2014-10-11
  Administered 2014-10-11: 06:00:00 via INTRAVENOUS

## 2014-10-11 MED ORDER — ONDANSETRON (PF) 4 MG/2 ML INJECTION
4 mg/2 mL | Freq: Once | INTRAMUSCULAR | Status: AC
Start: 2014-10-11 — End: 2014-10-11
  Administered 2014-10-11: 07:00:00 via INTRAVENOUS

## 2014-10-11 MED ORDER — LORAZEPAM 2 MG/ML IJ SOLN
2 mg/mL | INTRAMUSCULAR | Status: AC
Start: 2014-10-11 — End: 2014-10-11
  Administered 2014-10-11: 06:00:00 via INTRAVENOUS

## 2014-10-11 MED ORDER — PROMETHAZINE 12.5 MG TAB
12.5 mg | ORAL_TABLET | Freq: Four times a day (QID) | ORAL | 0 refills | Status: DC | PRN
Start: 2014-10-11 — End: 2014-10-12

## 2014-10-11 MED ORDER — SODIUM CHLORIDE 0.9 % IJ SYRG
Freq: Once | INTRAMUSCULAR | Status: AC
Start: 2014-10-11 — End: 2014-10-11
  Administered 2014-10-11: 06:00:00 via INTRAVENOUS

## 2014-10-11 MED FILL — ONDANSETRON (PF) 4 MG/2 ML INJECTION: 4 mg/2 mL | INTRAMUSCULAR | Qty: 2

## 2014-10-11 MED FILL — METOCLOPRAMIDE 5 MG/ML IJ SOLN: 5 mg/mL | INTRAMUSCULAR | Qty: 2

## 2014-10-11 MED FILL — DIPHENHYDRAMINE HCL 50 MG/ML IJ SOLN: 50 mg/mL | INTRAMUSCULAR | Qty: 1

## 2014-10-11 MED FILL — LORAZEPAM 2 MG/ML IJ SOLN: 2 mg/mL | INTRAMUSCULAR | Qty: 1

## 2014-10-11 NOTE — ED Notes (Signed)
Pt awaiting ride from mother and states she will be here when she gets off work.

## 2014-10-11 NOTE — ED Notes (Signed)
8:11 AM  10/11/14     Discharge instructions given to patient by Rozanna Boer, RN (name) with verbalization of understanding. Patient accompanied by self.   Patient discharged to home and ambulated to front lobby to await ride without incident   Teofilo Pod, RN

## 2014-10-11 NOTE — ED Triage Notes (Signed)
Pt states she has been having upper abdominal pain since approx noon today. Pt denies any injury.

## 2014-10-11 NOTE — ED Provider Notes (Signed)
[facility]  EMERGENCY DEPARTMENT TREATMENT REPORT  NAME:  [name] [name]  SEX:   F  ADMIT: 10/11/2014  DOB:   07-21-1985  MR#    [id number]  ROOM:  UE45  TIME DICTATED: 01 29 AM  ACCT#  [id number]        TIME OF EVALUATION:  1:15 a.m.     PRIMARY CARE PROVIDER:  Unknown.    CHIEF COMPLAINT:  Abdominal pain.    HISTORY OF PRESENT ILLNESS:  This is a 29 year old female with past medical history significant for   diabetes and gastroparesis presenting to the emergency room today secondary to   abdominal pain.  The patient reports the pain started around noon today.  It   is described as sharp, left upper quadrant of her abdomen, associated with   nausea.  She has had about 7 episodes of vomiting.  No hematemesis.  Denies   any associated diarrhea or fever.  She does not have any antiemetics currently   prescribed to her and was unable to take anything for her nausea and   subsequently is here for further evaluation.  She does state that she has been   monitoring her sugars.  The last it was checked it was in the 220 range.  She   has been taking her insulin as directed based on this.    REVIEW OF SYSTEMS:  CONSTITUTIONAL:  No fever.  EYES:   No visual symptoms.  ENT:  No sore throat, runny nose, or other URI symptoms.  RESPIRATORY:  No shortness of breath.  CARDIOVASCULAR:  No chest pain.  GASTROINTESTINAL:  Positive for abdominal pain and vomiting.  No diarrhea.  GENITOURINARY:  Denies dysuria, frequency or urgency.  MUSCULOSKELETAL:  Denies joint pain.  INTEGUMENTARY:  No rashes.  NEUROLOGIC:  No headache.    PAST MEDICAL HISTORY:  Diabetes, gastroparesis, hypertension, C-section.    SOCIAL HISTORY:  Nonsmoker.    FAMILY HISTORY:  Noncontributory.    MEDICATIONS:  Reviewed.  In addition the patient tells me that she is on Depo shot for birth   control method.    ALLERGIES:  REVIEWED.    PHYSICAL EXAMINATION:  VITAL SIGNS:  Temperature 99.1, pulse 80, respirations 18, blood pressure    128/90, O2 sat is 100% on room air.  GENERAL:  The patient well nourished and well developed, answering questions   appropriately, in no distress.  HEENT:  Eyes:  Conjunctivae clear, lids normal.  Pupils equal, symmetrical,   and normally reactive.  Mouth:  Mucous membranes are somewhat moist.   RESPIRATORY:  Clear and equal breath sounds.  No respiratory distress,   tachypnea, or accessory muscle use.    CARDIOVASCULAR:  Heart regular, without murmurs, gallops, rubs, or thrills.   GASTROINTESTINAL:  Normoactive bowel sounds.  Abdomen is soft.  Mild   tenderness to palpation in the left upper quadrant, otherwise no rebound or   guarding is appreciated.    MUSCULOSKELETAL:  No localized midline tenderness to the back.  No flank pain.    Moving all extremities.  SKIN:  No rashes.  NEUROLOGIC:  Alert, oriented.  Sensation intact, motor strength equal and   symmetric.     CONTINUATION BY Kristeen Mans, PA:     INITIAL ASSESSMENT AND MANAGEMENT PLAN:  This 29 year old female has diabetic gastroparesis here with vomiting, minimal   left upper quadrant pain on evaluation.  I suspect this is more of a   gastritis from vomiting.  There is no  lower abdominal tenderness.  She has had   no fevers, do not feel that she needs any kind of emergent imaging at this   time.  We will repeat labs and treat symptomatically for her nausea.     DIAGNOSTIC INTERPRETATIONS:  Urine negative for infection, negative for pregnancy.  Glucose, however, was   noted to be present in the patient's urine.  Revealed a normal white count   10.5, 78% segs, otherwise unremarkable hemoglobin and hematocrit 12.0 and 37.1   respectively.   CMP revealed a bicarbonate of 20 with a glucose of 357,   alkaline phosphatase slightly elevated at 141 was otherwise unremarkable.    Lipase normal.      EMERGENCY ROOM COURSE:   Patient stable while here, was given IV Benadryl, IV Reglan, IV Ativan and IV    fluids for symptomatic treatment.  Upon evaluation by Dr. Wenda Overland,   additional IV fluids and IV Zofran were ordered for the patient.  The   patient's IV infusion is still going.  I suspect that she will be able to be   discharged after IV fluids complete.  She is currently resting comfortably and   feeling better.  Abdominal pain much improved.     CLINICAL IMPRESSION AND DIAGNOSES:   Diabetic gastroparesis.    DISPOSITION AND PLAN:  The patient turned over at change of shift to Dr. Wenda Overland.  The patient   was personally evaluated by myself and Dr. Wenda Overland who agrees with the   above assessment and plan.       CONTINUATION BY Reeshemah Nazaryan HIMMEL-Djibril Glogowski, DO    INITIAL ASSESSMENT AND MANAGEMENT PLAN:  Briefly, the patient is a 29 year old lady who presents here with vomiting and   nausea.  She has a history of diabetic gastroparesis and we know her well   from previous visits.  The patient looking okay today.  She is not as sick as   I have seen her in the past.  The patient has not been here since June.  I   believe that we can make her more comfortable and she can safely be   dispositioned home.    COURSE IN THE EMERGENCY DEPARTMENT:  The patient received 2 liters of fluids and Zofran as well as Reglan.  She got   up and ambulated to the bathroom and stated that her pain returned.  She was   given 1 mg of Ativan because this seems to help her.  She had also been given   25 of Benadryl here as well.  The patient doing better, was reevaluated,   feeling improved and wished to be dispositioned home and therefore, she was.    CLINICAL IMPRESSION AND DIAGNOSES:  1.  Nausea and vomiting.  2.  History of gastroparesis.    DISPOSITION:  Home.      ___________________  Liberty Handy Himmel-Marylynn Rigdon DO  Dictated By: Kristeen Mans, PA    My signature above authenticates this document and my orders, the final   diagnosis (es), discharge prescription (s), and instructions in the Epic   record.   If you have any questions please contact [phone].    Nursing notes have been reviewed by the physician/ advanced practice   clinician.    JMB  D:10/11/2014 01:29:18  T: 10/11/2014 12:50:52  [id number]

## 2014-10-11 NOTE — ED Notes (Signed)
Patient sleeping, states has minimal nausea when awoken. zofran given

## 2014-10-12 ENCOUNTER — Inpatient Hospital Stay
Admit: 2014-10-12 | Discharge: 2014-10-12 | Disposition: A | Discharge status: Home / Self Care | Payer: MEDICAID | Attending: Emergency Medicine

## 2014-10-12 DIAGNOSIS — E1143 Type 2 diabetes mellitus with diabetic autonomic (poly)neuropathy: Secondary | ICD-10-CM

## 2014-10-12 LAB — CBC WITH AUTOMATED DIFF
BASOPHILS: 0.3 % (ref 0–3)
EOSINOPHILS: 0 % (ref 0–5)
HCT: 34.9 % — ABNORMAL LOW (ref 37.0–50.0)
HGB: 11.5 gm/dl — ABNORMAL LOW (ref 13.0–17.2)
IMMATURE GRANULOCYTES: 0.3 % (ref 0.0–3.0)
LYMPHOCYTES: 22.9 % — ABNORMAL LOW (ref 28–48)
MCH: 26.7 pg (ref 25.4–34.6)
MCHC: 33 gm/dl (ref 30.0–36.0)
MCV: 81.2 fL (ref 80.0–98.0)
MONOCYTES: 4.5 % (ref 1–13)
MPV: 12.5 fL — ABNORMAL HIGH (ref 6.0–10.0)
NEUTROPHILS: 72 % — ABNORMAL HIGH (ref 34–64)
NRBC: 0 (ref 0–0)
PLATELET: 245 10*3/uL (ref 140–450)
RBC: 4.3 M/uL (ref 3.60–5.20)
RDW-SD: 38.8 (ref 36.4–46.3)
WBC: 8.8 10*3/uL (ref 4.0–11.0)

## 2014-10-12 LAB — POC CHEM8
BUN: 23 mg/dl (ref 7–25)
CALCIUM,IONIZED: 4.4 mg/dL (ref 4.40–5.40)
CO2, TOTAL: 20 mmol/L — ABNORMAL LOW (ref 21–32)
Chloride: 104 mEq/L (ref 98–107)
Creatinine: 0.9 mg/dl (ref 0.6–1.3)
Glucose: 386 mg/dL — ABNORMAL HIGH (ref 74–106)
HCT: 38 % (ref 38–45)
HGB: 12.9 gm/dl — ABNORMAL LOW (ref 13.0–17.2)
Potassium: 4.1 mEq/L (ref 3.5–4.9)
Sodium: 134 mEq/L — ABNORMAL LOW (ref 136–145)

## 2014-10-12 LAB — HEPATIC FUNCTION PANEL
ALT (SGPT): 14 U/L (ref 12–78)
AST (SGOT): 19 U/L (ref 15–37)
Albumin: 3.3 gm/dl — ABNORMAL LOW (ref 3.4–5.0)
Alk. phosphatase: 135 U/L — ABNORMAL HIGH (ref 45–117)
Bilirubin, direct: 0.1 mg/dl (ref 0.0–0.2)
Bilirubin, total: 1.4 mg/dl — ABNORMAL HIGH (ref 0.2–1.0)
Protein, total: 7.5 gm/dl (ref 6.4–8.2)

## 2014-10-12 LAB — HEMOGLOBIN A1C W/O EAG: Hemoglobin A1c: 10.1 % — ABNORMAL HIGH (ref 4.8–6.0)

## 2014-10-12 LAB — GLUCOSE, POC: Glucose (POC): 240 mg/dL — ABNORMAL HIGH (ref 65–105)

## 2014-10-12 MED ORDER — LISINOPRIL 20 MG TAB
20 mg | Freq: Every day | ORAL | Status: DC
Start: 2014-10-12 — End: 2014-10-13
  Administered 2014-10-12 – 2014-10-13 (×3): via ORAL

## 2014-10-12 MED ORDER — NALOXONE 0.4 MG/ML INJECTION
0.4 mg/mL | INTRAMUSCULAR | Status: DC | PRN
Start: 2014-10-12 — End: 2014-10-13

## 2014-10-12 MED ORDER — SODIUM CHLORIDE 0.9% BOLUS IV
0.9 % | INTRAVENOUS | Status: AC
Start: 2014-10-12 — End: 2014-10-12
  Administered 2014-10-12: 20:00:00 via INTRAVENOUS

## 2014-10-12 MED ORDER — METOCLOPRAMIDE 5 MG/5 ML ORAL SOLN
5 mg/ mL | Freq: Three times a day (TID) | ORAL | Status: DC
Start: 2014-10-12 — End: 2014-10-13
  Administered 2014-10-12 – 2014-10-13 (×3): via ORAL

## 2014-10-12 MED ORDER — LORAZEPAM 2 MG/ML IJ SOLN
2 mg/mL | Freq: Once | INTRAMUSCULAR | Status: AC
Start: 2014-10-12 — End: 2014-10-12
  Administered 2014-10-12: 19:00:00 via INTRAVENOUS

## 2014-10-12 MED ORDER — SODIUM CHLORIDE 0.9% BOLUS IV
0.9 % | INTRAVENOUS | Status: AC
Start: 2014-10-12 — End: 2014-10-12
  Administered 2014-10-12: 18:00:00 via INTRAVENOUS

## 2014-10-12 MED ORDER — MORPHINE 2 MG/ML INJECTION
2 mg/mL | INTRAMUSCULAR | Status: DC | PRN
Start: 2014-10-12 — End: 2014-10-13
  Administered 2014-10-13 (×3): via INTRAVENOUS

## 2014-10-12 MED ORDER — DIPHENHYDRAMINE HCL 50 MG/ML IJ SOLN
50 mg/mL | INTRAMUSCULAR | Status: AC
Start: 2014-10-12 — End: 2014-10-12
  Administered 2014-10-12: 19:00:00 via INTRAVENOUS

## 2014-10-12 MED ORDER — ENOXAPARIN 40 MG/0.4 ML SUB-Q SYRINGE
40 mg/0.4 mL | SUBCUTANEOUS | Status: DC
Start: 2014-10-12 — End: 2014-10-13

## 2014-10-12 MED ORDER — INSULIN LISPRO 100 UNIT/ML INJECTION
100 unit/mL | Freq: Four times a day (QID) | SUBCUTANEOUS | Status: DC
Start: 2014-10-12 — End: 2014-10-13
  Administered 2014-10-13 (×3): via SUBCUTANEOUS

## 2014-10-12 MED ORDER — METOCLOPRAMIDE 5 MG/ML IJ SOLN
5 mg/mL | INTRAMUSCULAR | Status: AC
Start: 2014-10-12 — End: 2014-10-12
  Administered 2014-10-12: 19:00:00 via INTRAVENOUS

## 2014-10-12 MED ORDER — HYDRALAZINE 20 MG/ML IJ SOLN
20 mg/mL | Freq: Four times a day (QID) | INTRAMUSCULAR | Status: DC | PRN
Start: 2014-10-12 — End: 2014-10-13

## 2014-10-12 MED ORDER — METOCLOPRAMIDE 5 MG/ML IJ SOLN
5 mg/mL | INTRAMUSCULAR | Status: AC
Start: 2014-10-12 — End: 2014-10-12
  Administered 2014-10-12: 18:00:00 via INTRAVENOUS

## 2014-10-12 MED ORDER — SODIUM CHLORIDE 0.9 % IJ SYRG
Freq: Once | INTRAMUSCULAR | Status: AC
Start: 2014-10-12 — End: 2014-10-12
  Administered 2014-10-12: 18:00:00 via INTRAVENOUS

## 2014-10-12 MED ORDER — ONDANSETRON (PF) 4 MG/2 ML INJECTION
4 mg/2 mL | INTRAMUSCULAR | Status: DC | PRN
Start: 2014-10-12 — End: 2014-10-13

## 2014-10-12 MED ORDER — SODIUM CHLORIDE 0.9 % IV
INTRAVENOUS | Status: AC
Start: 2014-10-12 — End: 2014-10-12
  Administered 2014-10-12: 23:00:00 via INTRAVENOUS

## 2014-10-12 MED ORDER — PROMETHAZINE IN NS 12.5 MG/50 ML IV PIGGY BAG
12.5 mg/50 ml | Freq: Four times a day (QID) | INTRAVENOUS | Status: DC | PRN
Start: 2014-10-12 — End: 2014-10-13

## 2014-10-12 MED ORDER — DICYCLOMINE 20 MG TAB
20 mg | ORAL_TABLET | Freq: Four times a day (QID) | ORAL | 0 refills | Status: DC
Start: 2014-10-12 — End: 2014-10-12

## 2014-10-12 MED ORDER — LORAZEPAM 2 MG/ML IJ SOLN
2 mg/mL | Freq: Once | INTRAMUSCULAR | Status: AC
Start: 2014-10-12 — End: 2014-10-12
  Administered 2014-10-12: 18:00:00 via INTRAVENOUS

## 2014-10-12 MED ORDER — SODIUM CHLORIDE 0.9 % IV
INTRAVENOUS | Status: DC
Start: 2014-10-12 — End: 2014-10-12
  Administered 2014-10-12: 22:00:00 via INTRAVENOUS

## 2014-10-12 MED ORDER — OXYCODONE-ACETAMINOPHEN 5 MG-325 MG TAB
5-325 mg | ORAL | Status: DC | PRN
Start: 2014-10-12 — End: 2014-10-13

## 2014-10-12 MED ORDER — DIPHENHYDRAMINE HCL 50 MG/ML IJ SOLN
50 mg/mL | Freq: Once | INTRAMUSCULAR | Status: AC
Start: 2014-10-12 — End: 2014-10-12
  Administered 2014-10-12: 18:00:00 via INTRAVENOUS

## 2014-10-12 MED ORDER — PROMETHAZINE 25 MG RECTAL SUPPOSITORY
25 mg | Freq: Four times a day (QID) | RECTAL | 0 refills | Status: DC | PRN
Start: 2014-10-12 — End: 2014-10-12

## 2014-10-12 MED FILL — METOCLOPRAMIDE 5 MG/ML IJ SOLN: 5 mg/mL | INTRAMUSCULAR | Qty: 2

## 2014-10-12 MED FILL — PROMETHAZINE IN NS 12.5 MG/50 ML IV PIGGY BAG: 12.5 mg/50 ml | INTRAVENOUS | Qty: 50

## 2014-10-12 MED FILL — SODIUM CHLORIDE 0.9 % IV: INTRAVENOUS | Qty: 1000

## 2014-10-12 MED FILL — LISINOPRIL 20 MG TAB: 20 mg | ORAL | Qty: 1

## 2014-10-12 MED FILL — DIPHENHYDRAMINE HCL 50 MG/ML IJ SOLN: 50 mg/mL | INTRAMUSCULAR | Qty: 1

## 2014-10-12 MED FILL — LORAZEPAM 2 MG/ML IJ SOLN: 2 mg/mL | INTRAMUSCULAR | Qty: 1

## 2014-10-12 MED FILL — METOCLOPRAMIDE 5 MG/5 ML ORAL SOLN: 5 mg/ mL | ORAL | Qty: 10

## 2014-10-12 MED FILL — BD POSIFLUSH NORMAL SALINE 0.9 % INJECTION SYRINGE: INTRAMUSCULAR | Qty: 10

## 2014-10-12 NOTE — H&P (Signed)
History and Physical      NAME:  [name] [name]   DOB:   1985/10/31   MRN:   [id number]     Date/Time:  10/12/2014     CHIEF COMPLAINT: nausea and vomiting     HISTORY OF PRESENT ILLNESS:     Ms. Croswell is a 29 y.o.   female with a PMH of Diabetes, gastroparesis and HTN who presents with c/c of  Nausea and vomiting.  Patient has intractable nausea and vomiting. She also has epigastric pain. No fever or chills.   She has several hospital admissions for gastroaresis. She was given IV zofran and IVF in ED with minimal improvement.      Past Medical History   Diagnosis Date   ??? Diabetes ([place])    ??? Gastroparesis    ??? Gastroparesis    ??? Hypertension         Past Surgical History   Procedure Laterality Date   ??? Hx cesarean section         Social History   Substance Use Topics   ??? Smoking status: Never Smoker   ??? Smokeless tobacco: Not on file   ??? Alcohol use No        History reviewed. No pertinent family history.     Allergies   Allergen Reactions   ??? Penicillins Hives   ??? Zithromax [Azithromycin] Hives        Prior to Admission medications    Medication Sig Start Date End Date Taking? Authorizing Provider   oxyCODONE-acetaminophen (PERCOCET) 5-325 mg per tablet Take 1 Tab by mouth every four (4) hours as needed. Max Daily Amount: 6 Tabs. 06/04/14   Stanton Kidney, MD   insulin glargine (LANTUS SOLOSTAR) 100 unit/mL (3 mL) pen 25 Units by SubCUTAneous route nightly. 05/04/14   Barkley Boards, DO   lisinopril (PRINIVIL, ZESTRIL) 20 mg tablet Take 20 mg by mouth daily.    Miguel Rota, PA   metoclopramide HCl (REGLAN) 5 mg/5 mL syrup Take 1 tsp by mouth Before breakfast, lunch, and dinner.    Grant Ruts, MD   insulin glulisine (APIDRA SOLOSTAR) 100 unit/mL pen 5 Units by SubCUTAneous route Before breakfast, lunch, and dinner. Indications: TYPE 1 DIABETES MELLITUS, pt states this is her sliding scale insulin    Karma Greaser, MD       REVIEW OF SYSTEMS:      CONSTITUTIONAL: No Fever, No chills, No weight loss, No Night sweats  HEENT:  No epistaxis, No diff in swallowing  CVS: No chest pain, No palpitations, No syncope, No peripheral edema, No PND, No orthopnea  RS: No shortness of breath, No cough, No hemoptysis, No pleuritic chest pain  GI: No diarrhea, No hematemesis, No rectal bleeding, No acid reflux or heartburn  NEURO: No focal weakness, No headaches, No seizures  PSYCH: No anxiety, No depression  MUSCULOSKLETAL: No joint pain or swelling  GU: No hematuria or dysuria  SKIN: No rash       Physical Exam:     VITALS:    Vital signs reviewed; most recent are:    Visit Vitals   ??? BP (!) 158/97 (BP 1 Location: Left arm, BP Patient Position: At rest)   ??? Pulse (!) 109   ??? Temp 98.6 ??F (37 ??C)   ??? Resp 18   ??? Ht $Remo'5\' 6"'BEHJx$  (1.676 m)   ??? Wt 54.9 kg (121 lb)   ??? SpO2 100%   ??? Breastfeeding No   ???  BMI 19.53 kg/m2     SpO2 Readings from Last 6 Encounters:   10/12/14 100%   10/11/14 100%   07/09/14 100%   06/18/14 100%   06/04/14 100%   05/27/14 100%        No intake or output data in the 24 hours ending 10/12/14 2124       GENERAL: Not in acute distress  HEENT: pink conjunctiva, un icteric sclera,   NECK: No lymphadenopthy or thyroid swelling, JVD not seen  LYMPH: No supraclavicular or cervical or axillary nodes on both sides  CVS: S1S2, No murmurs, No gallop or rub  RS: CTA, No wheezing or crackles  Abd: Soft, non tender, not distended, No guarding, No rigidity  NEURO:  Non focal neurologic deficits   Extrm: no leg edema or swelling   Skin: No rash      Labs:  Recent Results (from the past 24 hour(s))   HEMOGLOBIN A1C W/O EAG    Collection Time: 10/12/14  1:40 PM   Result Value Ref Range    Hemoglobin A1c 10.1 (H) 4.8 - 6.0 %   POC CHEM8    Collection Time: 10/12/14  2:30 PM   Result Value Ref Range    Sodium 134 (L) 136 - 145 mEq/L    Potassium 4.1 3.5 - 4.9 mEq/L    Chloride 104 98 - 107 mEq/L    CO2, TOTAL 20 (L) 21 - 32 mmol/L    Glucose 386 (H) 74 - 106 mg/dL     BUN 23 7 - 25 mg/dl    Creatinine 0.9 0.6 - 1.3 mg/dl    HCT 38 38 - 45 %    HGB 12.9 (L) 13.0 - 17.2 gm/dl    CALCIUM,IONIZED 4.40 4.40 - 5.40 mg/dL   CBC WITH AUTOMATED DIFF    Collection Time: 10/12/14  4:29 PM   Result Value Ref Range    WBC 8.8 4.0 - 11.0 1000/mm3    RBC 4.30 3.60 - 5.20 M/uL    HGB 11.5 (L) 13.0 - 17.2 gm/dl    HCT 34.9 (L) 37.0 - 50.0 %    MCV 81.2 80.0 - 98.0 fL    MCH 26.7 25.4 - 34.6 pg    MCHC 33.0 30.0 - 36.0 gm/dl    PLATELET 245 140 - 450 1000/mm3    MPV 12.5 (H) 6.0 - 10.0 fL    RDW-SD 38.8 36.4 - 46.3      NRBC 0 0 - 0      IMMATURE GRANULOCYTES 0.3 0.0 - 3.0 %    NEUTROPHILS 72.0 (H) 34 - 64 %    LYMPHOCYTES 22.9 (L) 28 - 48 %    MONOCYTES 4.5 1 - 13 %    EOSINOPHILS 0.0 0 - 5 %    BASOPHILS 0.3 0 - 3 %   HEPATIC FUNCTION PANEL    Collection Time: 10/12/14  4:29 PM   Result Value Ref Range    AST 19 15 - 37 U/L    ALT 14 12 - 78 U/L    Alk. phosphatase 135 (H) 45 - 117 U/L    Bilirubin, total 1.4 (H) 0.2 - 1.0 mg/dl    Protein, total 7.5 6.4 - 8.2 gm/dl    Albumin 3.3 (L) 3.4 - 5.0 gm/dl    Bilirubin, direct 0.1 0.0 - 0.2 mg/dl   GLUCOSE, POC    Collection Time: 10/12/14  5:59 PM   Result Value Ref Range    Glucose (  POC) 240 (H) 65 - 105 mg/dL         Active Problems:    * No active hospital problems. *         Assessment:       1. Intractable nausea and vomiting secondary to #2  2. Diabetic gastroparesis  3. DM-2, uncontrolled  4. HTN, accelerated          Plan:      ?? Admit to medical floor  ?? Start on IVF  ?? IV zofran PRN  Advance diet as tolerated  ?? Will order reglan with meals  ?? Monitor blood glucose, SSI coverage  ?? DVT and GI prophylaxsis.        Total time:  55 minutes             _______________________________________________________________________        Attending Physician: Stanton Kidney, MD

## 2014-10-12 NOTE — Other (Signed)
Bedside shift change report given to  EVELYN del rosario LARA, RN   (oncoming nurse) by  Cyndy D. RN  (offgoing nurse). Report included the following information SBAR.

## 2014-10-12 NOTE — Progress Notes (Signed)
Held NS at 150 bec BP 185/102. Relayed this to Dr Tenny Craw.

## 2014-10-12 NOTE — ED Triage Notes (Signed)
C/ollq pain began this am seen here 2 days ago for same, dx'd with gastroporesis. C/o n/v.

## 2014-10-12 NOTE — ED Provider Notes (Signed)
[facility]  EMERGENCY DEPARTMENT TREATMENT REPORT  NAME:  [place], [place]  SEX:   F  ADMIT: 10/12/2014  DOB:   [birth year]  MR#    [id number]  ROOM:  5106  TIME DICTATED: 05 08 PM  ACCT#  [id number]        I hereby certify this patient for admission based upon medical necessity as   noted below:    TIME SEEN:  12:49 p.m.    CHIEF COMPLAINT:  Abdominal pain.      GASTROENTEROLOGIST:   Nurse practitioner, Rodney Booze.    HISTORY OF PRESENT ILLNESS:  This is a 29 year old female with history of gastroparesis, who presents to   the ED complaining of abdominal pain, nausea and vomiting.  She was here   yesterday with the same symptoms.  She did have improvement when she was   treated with IV Reglan, IV Benadryl and IV Ativan, was discharged home on p.o.   Phenergan, but woke up this morning unable to tolerate anything by mouth.    She has had 5 episodes of nonbloody emesis and is complaining of sharp   intermittent abdominal pain that worsened after she had vomited.  She denies   any diarrhea, no fevers, no urinary symptoms.  States that it feels like her   gastroparesis.  She had a colonoscopy and endoscopy performed 3 or 4 years ago   which was normal.    REVIEW OF SYSTEMS:  CONSTITUTIONAL:  No fever or chills.  EYES:  No visual symptoms.   ENT:  No sore throat, runny nose, or other URI symptoms.   ENDOCRINE:  Positive for diabetes.  RESPIRATORY:  No cough, shortness of breath.  CARDIOVASCULAR:  No chest pain.  GASTROINTESTINAL:  Positive for nausea, vomiting, abdominal pain, no diarrhea.  GENITOURINARY:  No dysuria or hematuria.  MUSCULOSKELETAL:  No swelling.  INTEGUMENTARY:  No rashes.  NEUROLOGIC:  No headaches.    PAST MEDICAL HISTORY:  Diabetes, gastroparesis, hypertension.    SURGICAL HISTORY:  Cesarean section.    SOCIAL HISTORY:  The patient with no tobacco, alcohol or drug use.    FAMILY HISTORY:  None.    MEDICATIONS:  Multiple and reviewed in Epic.    ALLERGIES:  PENICILLIN, ZITHROMAX.        PHYSICAL EXAMINATION:  VITAL SIGNS:  BP 178/96, pulse 112, respirations 17, temperature 98.4, O2 sats   99% on room air.  GENERAL APPEARANCE:  This is a well-developed, well-nourished young [place]   American female in no acute distress, was lying on stretcher under multiple   covers.    HEENT:  Eyes:  Conjunctiva clear, nonicteric.  Mouth/Throat:  Surfaces of the   pharynx, palate, and tongue are pink, moist, and without lesions.  RESPIRATORY:  Clear to auscultation bilaterally.  CARDIOVASCULAR:  The patient tachycardic but regular.  GI:  Abdomen soft, minimally tender all across her mid abdomen, but there is   no rebound or guarding noted.    MUSCULOSKELETAL:  The patient able to move all 4 extremities.  SKIN:  Warm and dry without rashes.   NEUROLOGICAL:  Alert, answers questions appropriately.    INITIAL ASSESSMENT AND MANAGEMENT PLAN:  This is a 29 year old female with history of diabetes and gastroparesis who   presents to the ED complaining of abdominal pain, nausea, vomiting, similar to   previous episodes of gastroparesis.  We will order i-STAT to check for any   electrolyte abnormalities and treat her symptomatically.  CONTINUATION BY ANNALYN ABCEDE, PA-C     DIAGNOSTIC INTERPRETATIONS:  Her CBC and hepatic function panel are still pending.  Her i-STAT showed    hemoglobin at 12.9, sodium 134, CO2 of 20, glucose is 386, otherwise   unremarkable.    COURSE IN ED:  The patient has remained stable throughout her stay in the ER.  Did not feel   she needed any repeated labs as she was just seen here last night with   unremarkable LFTs and lipase.  Did order the i-STAT to ensure patient was not   in DKA.  She has an anion gap of 10.  She was initially treated with IV   Reglan, IV Benadryl, IV Ativan and during reevaluation, she stated that she   had felt the same, although she was resting comfortably and asleep.  She did   receive 2nd doses of all the medications including another liter of fluids.     She continued to complain of the nausea and abdominal pain.  Also, patient had   remained tachycardic despite multiple liters of fluids and she was not    tachycardic yesterday, therefore, decided to admit the patient.  Dr. Tenny Craw   kindly admitted the patient to his service.    DIAGNOSES:  1.  Diabetic gastroparesis.   2.  Hyperglycemia.    DISPOSITION:  The patient will be going to an obs medical bed for IV fluids and antiemetics.       The patient was personally evaluated by myself and Dr. Jimmey Ralph who agrees with   the above assessment and plan.    CONTINUATION BY:  Floyce Stakes, MD    I interviewed and examined the patient.  I discussed with the mid-level   provider and agree with their evaluation and plan as documented here.     HISTORY OF PRESENT ILLNESS:  This is a 29 year old female who has known diabetic gastroparesis.  We have   made multiple attempts with symptomatic treatment using the medications that   have worked during her most recent Emergency Department visits, but we could   not get symptomatic improvement on her.  She is clearly dehydrated.  Her white   count is 8.8.  Her glucose is markedly elevated at 386, but her CO2 is 20 and   her anion gap was only 10.  She does not appear to be in diabetic   ketoacidosis.  Regardless, because of our inability to get symptomatic   resolution of her symptoms, then we are going to place her on observation   status for continued IV fluids and continued antiemetics.    CLINICAL IMPRESSION:  1.  Acute gastroparesis.    2.  Intractable nausea and vomiting.  3.  Hyperglycemia.    DISPOSITION:  Hospital observation -- med-surg bed.      ___________________  Johny Drilling MD  Dictated By: Asencion Islam, PA-C    My signature above authenticates this document and my orders, the final   diagnosis (es), discharge prescription (s), and instructions in the Epic   record.  If you have any questions please contact [phone].     Nursing notes have been reviewed by the physician/ advanced practice   clinician.    LS  D:10/12/2014 17:08:48  T: 10/12/2014 17:19:56  [id number]

## 2014-10-12 NOTE — ED Notes (Signed)
Patient denies any relief from the first round of medications--despite being woke up for re-assessment and appears to be groggy.

## 2014-10-12 NOTE — Other (Signed)
----------  DocumentID:   [id number]              [facility]                       Patient Education Report         Name: [name] [name], [name] [name]                  Date: 10/12/2014    MRN: [id number]                    Time: 5:30:49 PM         Patient ordered video: Patient Safety: Stay Safe While you are in the   Hospital    from [id number] via phone number: [phone] at 5:30:49 PM

## 2014-10-12 NOTE — ED Notes (Signed)
Patient sleeping soundly.

## 2014-10-12 NOTE — ED Notes (Signed)
TRANSFER - OUT REPORT:    Verbal report given to [facility] RN on [name] [name]  being transferred to 5106 for routine progression of care       Report consisted of patient???s Situation, Background, Assessment and   Recommendations(SBAR).     Information from the following report(s) SBAR was reviewed with the receiving nurse.    Lines:   Peripheral IV 10/12/14 Right Hand (Active)   Site Assessment Clean, dry, & intact 10/12/2014  1:47 PM   Phlebitis Assessment 0 10/12/2014  1:47 PM   Infiltration Assessment 0 10/12/2014  1:47 PM   Dressing Status Clean, dry, & intact 10/12/2014  1:47 PM   Dressing Type Transparent 10/12/2014  1:47 PM   Hub Color/Line Status Pink 10/12/2014  1:47 PM        Opportunity for questions and clarification was provided.      Patient transported with:   [employer]

## 2014-10-13 LAB — GLUCOSE, POC
Glucose (POC): 222 mg/dL — ABNORMAL HIGH (ref 65–105)
Glucose (POC): 218 mg/dL — ABNORMAL HIGH (ref 65–105)
Glucose (POC): 161 mg/dL — ABNORMAL HIGH (ref 65–105)

## 2014-10-13 MED ORDER — METOCLOPRAMIDE 5 MG/5 ML ORAL SOLN
5 mg/ mL | Freq: Four times a day (QID) | ORAL | 1 refills | Status: AC
Start: 2014-10-13 — End: 2014-11-12

## 2014-10-13 MED ORDER — ONDANSETRON HCL 4 MG TAB
4 mg | ORAL_TABLET | Freq: Three times a day (TID) | ORAL | 0 refills | Status: DC | PRN
Start: 2014-10-13 — End: 2014-11-28

## 2014-10-13 MED FILL — MORPHINE 2 MG/ML INJECTION: 2 mg/mL | INTRAMUSCULAR | Qty: 1

## 2014-10-13 MED FILL — METOCLOPRAMIDE 5 MG/5 ML ORAL SOLN: 5 mg/ mL | ORAL | Qty: 10

## 2014-10-13 MED FILL — LISINOPRIL 20 MG TAB: 20 mg | ORAL | Qty: 1

## 2014-10-13 NOTE — Other (Signed)
Bedside and Verbal shift change report given to JOAN del rosario BERGUNIO, RN   (oncoming nurse) by Evelyn Lara R.N. (offgoing nurse). Report included the following information SBAR.

## 2014-10-13 NOTE — Progress Notes (Signed)
Discharge Plan:   Home left before able to interview. See EPIC for follow up care and appointments as noted/ dc instructions to her via MD and dc RN.    Discharge Date:     10/13/2014       Home Health Needed:     n/a    Home Health Agency:    n/a    Confirmed start of care with the home health agency and spoke with:      n/a    DME needed and ordered for Discharge:    n/a    DME Company:    n/a    CM confirmed delivery of DME: with      n/a    TCC Referral:     Sent referral as is with no PCP per face sheet unable to confirm with pt    Medication Assistance given   no    meds given (please list)     n/a    Change(MDC/SSDI) Referral/outcome    N/.a

## 2014-10-13 NOTE — Progress Notes (Signed)
NUTRITION NOTE:    Provided patient with Healthy Food Choices for Diabetes.  Patient has had DM education on numerous occasions.  BS=240  On insulin.    Arneta Cliche, RD  10/13/14

## 2014-10-13 NOTE — Other (Signed)
Met with patient regarding home management of diabetes.  Have met with her numerous times in the past.  Has had an appt at [facility] with a Gastroenterologist regarding her gastroparesis.  Pt states [facility] MD recommended to get her A1c down to 6.0 so that she can proceed with Botox therapy.  Have recommended that the patient discuss this with her Endocrinologist, Dr. Edman Circle to see if an A1c of 6.0 is feasible let alone advisable.      Reviewed A1c of 10.1 with patient.  Currently on clear liquids only, states so far she has only had water.  Pt wants to have a portion of her Lantus dose to lower current BG.    She reports home insulin doses as:  Lantus 25 units once daily  Apidra for insulin to carb ratio of 1:10  Apidra for correction factor of 30.    Pt anxious to go home as today is her birthday but admits she is unable to eat solid food at the moment.

## 2014-10-13 NOTE — Progress Notes (Addendum)
Went to see for DCP needs and she was sleeping. Will have to follow up/      Went back to see at 1400 was still not available to interview. No family in room. Will follow up when able.

## 2014-10-13 NOTE — Discharge Summary (Signed)
Discharge Summary      Patient ID:    [name] [name]  [id number]  29 y.o.  22-Jan-1986    PCP: None    Admit date: 10/12/2014    Discharge date and time: 10/13/2014    CONSULTATIONS: None    Procedures: none    Admission Diagnoses: gastroparesis with intractable nausea and vomiting    Chronic Diagnoses:    Problem List as of 10/13/2014  Date Reviewed: 2014/07/17          Codes Class Noted - Resolved    Diabetic gastroparesis (HCC) ICD-10-CM: E11.43, K31.84  ICD-9-CM: 250.60, 536.3  July 17, 2014 - Present        Intractable nausea and vomiting ICD-10-CM: R11.2  ICD-9-CM: 536.2  06/02/2014 - Present        RESOLVED: Diabetic ketoacidosis (HCC) ICD-10-CM: E13.10  ICD-9-CM: 250.10  05/03/2014 - 2014/07/17        RESOLVED: DKA (diabetic ketoacidoses) (HCC) ICD-10-CM: E13.10  ICD-9-CM: 250.10  04/05/2014 - 2014/07/17              HOSPITAL COURSE:     1. Intractable nausea and vomiting secondary to #2    Patient feels better today  She wanted to go home  She is tolerating PO  Advance to low residue diet  Ok to d/c home    2. Diabetic gastroparesis    Continue reglan  3. DM-2, uncontrolled    Continue home meds  4. HTN, accelerated    BP is better today.  Resume home meds      Discharge Medications:   Current Discharge Medication List      START taking these medications    Details   ondansetron hcl (ZOFRAN, AS HYDROCHLORIDE,) 4 mg tablet Take 1 Tab by mouth every eight (8) hours as needed for Nausea.  Qty: 20 Tab, Refills: 0         CONTINUE these medications which have CHANGED    Details   metoclopramide HCl (REGLAN) 5 mg/5 mL syrup Take 10 mL by mouth Before breakfast, lunch, dinner and at bedtime for 30 days.  Qty: 120 mL, Refills: 1         CONTINUE these medications which have NOT CHANGED    Details   oxyCODONE-acetaminophen (PERCOCET) 5-325 mg per tablet Take 1 Tab by mouth every four (4) hours as needed. Max Daily Amount: 6 Tabs.  Qty: 20 Tab, Refills: 0       insulin glargine (LANTUS SOLOSTAR) 100 unit/mL (3 mL) pen 25 Units by SubCUTAneous route nightly.  Qty: 1 Each, Refills: 0      lisinopril (PRINIVIL, ZESTRIL) 20 mg tablet Take 20 mg by mouth daily.      insulin glulisine (APIDRA SOLOSTAR) 100 unit/mL pen 5 Units by SubCUTAneous route Before breakfast, lunch, and dinner. Indications: TYPE 1 DIABETES MELLITUS, pt states this is her sliding scale insulin         STOP taking these medications       promethazine (PHENERGAN) 12.5 mg tablet Comments:   Reason for Stopping:         dicyclomine (BENTYL) 20 mg tablet Comments:   Reason for Stopping:                Vitals:    Visit Vitals   ??? BP (!) 158/98 (BP 1 Location: Right arm, BP Patient Position: At rest)   ??? Pulse (!) 118   ??? Temp 98.7 ??F (37.1 ??C)   ??? Resp 18   ???  Ht  (1.676 m)   ??? Wt 54.9 kg (121 lb)   ??? SpO2 100%   ??? Breastfeeding No   ??? BMI 19.53 kg/m2     SpO2 Readings from Last 6 Encounters:   10/13/14 100%   10/11/14 100%   07/09/14 100%   06/18/14 100%   06/04/14 100%   05/27/14 100%          Intake/Output Summary (Last 24 hours) at 10/13/14 1119  Last data filed at 10/13/14 0940   Gross per 24 hour   Intake    600 ml   Output      0 ml   Net    600 ml        Significant Diagnostic Studies:   Recent Labs      10/12/14   1629  10/12/14   1430  10/11/14   0138   WBC  8.8   --   10.5   HGB  11.5*  12.9*  12.0*   HCT  34.9*  38  37.1   PLT  245   --   270     Recent Labs      10/12/14   1430  10/11/14   0138   NA  134*  136   K  4.1  3.7   CL  104  103   CO2  20*  20*   BUN  23  15   CREA  0.9  1.0   GLU  386*  357*   CA   --   8.7     Recent Labs      10/12/14   1629  10/11/14   0138   SGOT  19  22   AP  135*  141*   TP  7.5  7.9   ALB  3.3*  3.3*   LPSE   --   95     No results for input(s): INR, PTP, APTT in the last 72 hours.    No lab exists for component: INREXT   No components found for: GLPOC      Follow up Care:    1. None in 1 week    Diet:  Diabetic Diet    Disposition:  Home.     Discharge Condition: Good    Total D/c time: 30 minutes      Signed:  Theodis Aguas, MD  10/13/2014  11:19 AM

## 2014-11-28 ENCOUNTER — Emergency Department: Admit: 2014-11-29 | Payer: MEDICAID

## 2014-11-28 DIAGNOSIS — S29012D Strain of muscle and tendon of back wall of thorax, subsequent encounter: Secondary | ICD-10-CM
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
## 2014-11-28 NOTE — Other (Signed)
11:42 PM  11/28/14     Discharge instructions given to pt (name) with verbalization of understanding. Patient accompanied by family member.  Patient discharged with the following prescriptions naproxen and robaxin. Patient discharged to home (destination).      Bynum BellowsLisa Shoemaker, RN
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
## 2014-11-29 ENCOUNTER — Inpatient Hospital Stay
Admit: 2014-11-29 | Discharge: 2014-11-29 | Disposition: A | Discharge status: Home / Self Care | Payer: MEDICAID | Attending: Emergency Medicine

## 2014-11-29 ENCOUNTER — Emergency Department: Admit: 2014-11-29 | Payer: MEDICAID

## 2014-11-29 DIAGNOSIS — E1143 Type 2 diabetes mellitus with diabetic autonomic (poly)neuropathy: Secondary | ICD-10-CM

## 2014-11-29 LAB — METABOLIC PANEL, COMPREHENSIVE
ALT (SGPT): 16 U/L (ref 12–78)
AST (SGOT): 17 U/L (ref 15–37)
Albumin: 3.8 gm/dl (ref 3.4–5.0)
Alk. phosphatase: 125 U/L — ABNORMAL HIGH (ref 45–117)
BUN: 30 mg/dl — ABNORMAL HIGH (ref 7–25)
Bilirubin, total: 0.8 mg/dl (ref 0.2–1.0)
CO2: 19 mEq/L — ABNORMAL LOW (ref 21–32)
Calcium: 9.2 mg/dl (ref 8.5–10.1)
Chloride: 104 mEq/L (ref 98–107)
Creatinine: 1 mg/dl (ref 0.6–1.3)
GFR est AA: >60
GFR est non-AA: >60
Glucose: 193 mg/dl — ABNORMAL HIGH (ref 74–106)
Potassium: 3.7 mEq/L (ref 3.5–5.1)
Protein, total: 8.4 gm/dl — ABNORMAL HIGH (ref 6.4–8.2)
Sodium: 136 mEq/L (ref 136–145)

## 2014-11-29 LAB — CBC WITH AUTOMATED DIFF
BASOPHILS: 0.4 % (ref 0–3)
EOSINOPHILS: 0 % (ref 0–5)
HCT: 39.1 % (ref 37.0–50.0)
HGB: 13.1 gm/dl (ref 13.0–17.2)
IMMATURE GRANULOCYTES: 0.2 % (ref 0.0–3.0)
LYMPHOCYTES: 20.7 % — ABNORMAL LOW (ref 28–48)
MCH: 27.2 pg (ref 25.4–34.6)
MCHC: 33.5 gm/dl (ref 30.0–36.0)
MCV: 81.1 fL (ref 80.0–98.0)
MONOCYTES: 3.6 % (ref 1–13)
MPV: 11.6 fL — ABNORMAL HIGH (ref 6.0–10.0)
NEUTROPHILS: 75.1 % — ABNORMAL HIGH (ref 34–64)
NRBC: 0 (ref 0–0)
PLATELET: 288 10*3/uL (ref 140–450)
RBC: 4.82 M/uL (ref 3.60–5.20)
RDW-SD: 38.8 (ref 36.4–46.3)
WBC: 13 10*3/uL — ABNORMAL HIGH (ref 4.0–11.0)

## 2014-11-29 LAB — POC URINE MACROSCOPIC
Glucose: >=1000 mg/dl — AB
Ketone: 80 mg/dl — AB
Leukocyte Esterase: NEGATIVE
Nitrites: NEGATIVE
Protein: >=300 mg/dl — AB
Specific gravity: >=1.03 (ref 1.005–1.030)
Urobilinogen: 1 EU/dl (ref 0.0–1.0)
pH (UA): 5.5 (ref 5–9)

## 2014-11-29 LAB — POC CHEM8
BUN: 28 mg/dl — ABNORMAL HIGH (ref 7–25)
CALCIUM,IONIZED: 4.8 mg/dL (ref 4.40–5.40)
CO2, TOTAL: 19 mmol/L — ABNORMAL LOW (ref 21–32)
Chloride: 103 mEq/L (ref 98–107)
Creatinine: 0.9 mg/dl (ref 0.6–1.3)
Glucose: 197 mg/dL — ABNORMAL HIGH (ref 74–106)
HCT: 42 % (ref 38–45)
HGB: 14.3 gm/dl (ref 13.0–17.2)
Potassium: 3.6 mEq/L (ref 3.5–4.9)
Sodium: 137 mEq/L (ref 136–145)

## 2014-11-29 LAB — LIPASE: Lipase: 67 U/L — ABNORMAL LOW (ref 73–393)

## 2014-11-29 LAB — POC HCG,URINE: HCG urine, QL: NEGATIVE

## 2014-11-29 MED ORDER — NAPROXEN 250 MG TAB
250 mg | ORAL | Status: AC
Start: 2014-11-29 — End: 2014-11-28
  Administered 2014-11-29: 03:00:00 via ORAL

## 2014-11-29 MED ORDER — NAPROXEN 500 MG TAB
500 mg | ORAL_TABLET | Freq: Two times a day (BID) | ORAL | 0 refills | Status: AC
Start: 2014-11-29 — End: 2014-12-03

## 2014-11-29 MED ORDER — PROMETHAZINE 25 MG TAB
25 mg | ORAL_TABLET | Freq: Four times a day (QID) | ORAL | 0 refills | Status: DC | PRN
Start: 2014-11-29 — End: 2015-01-16

## 2014-11-29 MED ORDER — DIPHENHYDRAMINE HCL 50 MG/ML IJ SOLN
50 mg/mL | INTRAMUSCULAR | Status: AC
Start: 2014-11-29 — End: 2014-11-29
  Administered 2014-11-29: 15:00:00 via INTRAVENOUS

## 2014-11-29 MED ORDER — METOCLOPRAMIDE 10 MG TAB
10 mg | ORAL | Status: DC
Start: 2014-11-29 — End: 2014-11-29

## 2014-11-29 MED ORDER — SODIUM CHLORIDE 0.9% BOLUS IV
0.9 % | INTRAVENOUS | Status: AC
Start: 2014-11-29 — End: 2014-11-29
  Administered 2014-11-29: 11:00:00 via INTRAVENOUS

## 2014-11-29 MED ORDER — METOCLOPRAMIDE 5 MG/ML IJ SOLN
5 mg/mL | Freq: Four times a day (QID) | INTRAMUSCULAR | Status: DC
Start: 2014-11-29 — End: 2014-11-29
  Administered 2014-11-29: 12:00:00 via INTRAVENOUS

## 2014-11-29 MED ORDER — METOCLOPRAMIDE 5 MG/ML IJ SOLN
5 mg/mL | Freq: Once | INTRAMUSCULAR | Status: DC
Start: 2014-11-29 — End: 2014-11-29

## 2014-11-29 MED ORDER — PROMETHAZINE IN NS 25 MG/50 ML IV PIGGY BAG
25 mg/50 ml | INTRAVENOUS | Status: AC
Start: 2014-11-29 — End: 2014-11-29
  Administered 2014-11-29: 15:00:00 via INTRAVENOUS

## 2014-11-29 MED ORDER — PROMETHAZINE 25 MG RECTAL SUPPOSITORY
25 mg | Freq: Four times a day (QID) | RECTAL | 0 refills | Status: AC | PRN
Start: 2014-11-29 — End: 2014-12-06

## 2014-11-29 MED ORDER — DICYCLOMINE 10 MG CAP
10 mg | ORAL_CAPSULE | Freq: Four times a day (QID) | ORAL | 0 refills | Status: AC
Start: 2014-11-29 — End: 2014-12-04

## 2014-11-29 MED ORDER — IOPAMIDOL 76 % IV SOLN
370 mg iodine /mL (76 %) | Freq: Once | INTRAVENOUS | Status: AC
Start: 2014-11-29 — End: 2014-11-29
  Administered 2014-11-29: 14:00:00 via INTRAVENOUS

## 2014-11-29 MED ORDER — METHOCARBAMOL 750 MG TAB
750 mg | ORAL_TABLET | Freq: Four times a day (QID) | ORAL | 0 refills | Status: DC
Start: 2014-11-29 — End: 2014-11-29

## 2014-11-29 MED ORDER — METHOCARBAMOL 750 MG TAB
750 mg | ORAL | Status: AC
Start: 2014-11-29 — End: 2014-11-28
  Administered 2014-11-29: 03:00:00 via ORAL

## 2014-11-29 MED ORDER — DIPHENHYDRAMINE HCL 50 MG/ML IJ SOLN
50 mg/mL | INTRAMUSCULAR | Status: AC
Start: 2014-11-29 — End: 2014-11-29
  Administered 2014-11-29: 12:00:00 via INTRAVENOUS

## 2014-11-29 MED ORDER — SODIUM CHLORIDE 0.9% BOLUS IV
0.9 % | INTRAVENOUS | Status: AC
Start: 2014-11-29 — End: 2014-11-29
  Administered 2014-11-29: 14:00:00 via INTRAVENOUS

## 2014-11-29 MED FILL — NAPROXEN 250 MG TAB: 250 mg | ORAL | Qty: 2

## 2014-11-29 MED FILL — ISOVUE-370  76 % INTRAVENOUS SOLUTION: 370 mg iodine /mL (76 %) | INTRAVENOUS | Qty: 80

## 2014-11-29 MED FILL — METHOCARBAMOL 750 MG TAB: 750 mg | ORAL | Qty: 1

## 2014-11-29 MED FILL — DIPHENHYDRAMINE HCL 50 MG/ML IJ SOLN: 50 mg/mL | INTRAMUSCULAR | Qty: 1

## 2014-11-29 MED FILL — METOCLOPRAMIDE 5 MG/ML IJ SOLN: 5 mg/mL | INTRAMUSCULAR | Qty: 2

## 2014-11-29 MED FILL — PROMETHAZINE IN NS 25 MG/50 ML IV PIGGY BAG: 25 mg/50 ml | INTRAVENOUS | Qty: 50

## 2014-11-29 NOTE — ED Notes (Signed)
Assumed patient care, seen patient resting with IVF still infusing. Nausea is slightly better. Will finish the IVF before discharge.

## 2014-11-29 NOTE — ED Notes (Signed)
Bedside and Verbal shift change report given to Glenda Rn (oncoming nurse) by Blanca Haladyna, RN (offgoing nurse). Report included the following information SBAR, ED Summary, Procedure Summary and MAR.

## 2014-11-29 NOTE — ED Notes (Signed)
I have reviewed discharge instructions with the patient.  The patient verbalized understanding. Patient called for a ride and will sit in the lobby while waiting. Ice chips provided per request.

## 2014-11-29 NOTE — ED Notes (Signed)
Upon starring IVF noted iv not working properly, no swelling noted, patient denies pain, however difficult to flush and fluids not infusing, will attempt to establish new iv access.

## 2014-11-29 NOTE — ED Notes (Signed)
Pt requesting something for pain, PA-c aware

## 2014-11-29 NOTE — ED Triage Notes (Signed)
abd pain started 2 days ago.  Hx of the same.  N/v as well but denies diarrhea
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
## 2014-11-30 ENCOUNTER — Inpatient Hospital Stay
Admit: 2014-11-30 | Discharge: 2014-11-30 | Disposition: A | Discharge status: Home / Self Care | Payer: MEDICAID | Attending: Emergency Medicine

## 2014-11-30 DIAGNOSIS — R253 Fasciculation: Secondary | ICD-10-CM

## 2014-11-30 MED ORDER — CYCLOBENZAPRINE 10 MG TAB
10 mg | ORAL | Status: AC
Start: 2014-11-30 — End: 2014-11-30
  Administered 2014-11-30: 21:00:00 via ORAL

## 2014-11-30 MED ORDER — PROMETHAZINE 25 MG/ML INJECTION
25 mg/mL | Freq: Four times a day (QID) | INTRAMUSCULAR | Status: DC | PRN
Start: 2014-11-30 — End: 2014-11-30
  Administered 2014-11-30: 21:00:00 via INTRAMUSCULAR

## 2014-11-30 MED ORDER — DIPHENHYDRAMINE HCL 50 MG/ML IJ SOLN
50 mg/mL | Freq: Once | INTRAMUSCULAR | Status: AC
Start: 2014-11-30 — End: 2014-11-30
  Administered 2014-11-30: 21:00:00 via INTRAMUSCULAR

## 2014-11-30 MED ORDER — ONDANSETRON 4 MG TAB, RAPID DISSOLVE
4 mg | ORAL | Status: AC
Start: 2014-11-30 — End: 2014-11-30
  Administered 2014-11-30: 20:00:00 via SUBLINGUAL

## 2014-11-30 MED ORDER — CYCLOBENZAPRINE 5 MG TAB
5 mg | ORAL_TABLET | Freq: Three times a day (TID) | ORAL | 0 refills | Status: DC | PRN
Start: 2014-11-30 — End: 2015-01-16

## 2014-11-30 MED FILL — CYCLOBENZAPRINE 10 MG TAB: 10 mg | ORAL | Qty: 1

## 2014-11-30 MED FILL — PROMETHAZINE 25 MG/ML INJECTION: 25 mg/mL | INTRAMUSCULAR | Qty: 1

## 2014-11-30 MED FILL — ONDANSETRON 4 MG TAB, RAPID DISSOLVE: 4 mg | ORAL | Qty: 1

## 2014-11-30 MED FILL — DIPHENHYDRAMINE HCL 50 MG/ML IJ SOLN: 50 mg/mL | INTRAMUSCULAR | Qty: 1

## 2014-11-30 NOTE — ED Notes (Signed)
Pt reports less shaking after receiving medications.  Pt able to tolerate juice without nausea or vomiting.

## 2014-11-30 NOTE — ED Notes (Signed)
5:20 PM  11/30/14     Discharge instructions given to patient (name) with verbalization of understanding. Patient accompanied by mother.  Patient discharged with the following prescriptions flexeril. Patient discharged to home (destination). Pt assisted to ER lobby via wheelchair.     Ginger Carneerra C Key, RN
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
## 2014-11-30 NOTE — ED Notes (Signed)
Pt given zofran ODT and began vomiting withing 5 minutes of receiving it.  Requesting not to be medicated orally.  Attempting to notify [place] PA-C.
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
## 2014-12-22 ENCOUNTER — Encounter

## 2014-12-25 ENCOUNTER — Inpatient Hospital Stay: Admit: 2014-12-25 | Payer: MEDICAID | Attending: Obstetrics & Gynecology

## 2014-12-25 DIAGNOSIS — N63 Unspecified lump in breast: Secondary | ICD-10-CM

## 2015-01-16 ENCOUNTER — Inpatient Hospital Stay
Admit: 2015-01-16 | Discharge: 2015-01-17 | Disposition: A | Discharge status: Home / Self Care | Payer: MEDICAID | Attending: Emergency Medicine

## 2015-01-16 DIAGNOSIS — E1143 Type 2 diabetes mellitus with diabetic autonomic (poly)neuropathy: Secondary | ICD-10-CM

## 2015-01-16 LAB — POC CHEM8
BUN: 11 mg/dl (ref 7–25)
CALCIUM,IONIZED: 4.4 mg/dL (ref 4.40–5.40)
CO2, TOTAL: 22 mmol/L (ref 21–32)
Chloride: 104 mEq/L (ref 98–107)
Creatinine: 0.7 mg/dl (ref 0.6–1.3)
Glucose: 270 mg/dL — ABNORMAL HIGH (ref 74–106)
HCT: 40 % (ref 38–45)
HGB: 13.6 gm/dl (ref 13.0–17.2)
Potassium: 3.8 mEq/L (ref 3.5–4.9)
Sodium: 138 mEq/L (ref 136–145)

## 2015-01-16 LAB — CBC WITH AUTOMATED DIFF
BASOPHILS: 0.5 % (ref 0–3)
EOSINOPHILS: 0 % (ref 0–5)
HCT: 38.1 % (ref 37.0–50.0)
HGB: 12.4 gm/dl — ABNORMAL LOW (ref 13.0–17.2)
IMMATURE GRANULOCYTES: 0.3 % (ref 0.0–3.0)
LYMPHOCYTES: 23.2 % — ABNORMAL LOW (ref 28–48)
MCH: 27.7 pg (ref 25.4–34.6)
MCHC: 32.5 gm/dl (ref 30.0–36.0)
MCV: 85 fL (ref 80.0–98.0)
MONOCYTES: 3.7 % (ref 1–13)
MPV: 12.6 fL — ABNORMAL HIGH (ref 6.0–10.0)
NEUTROPHILS: 72.3 % — ABNORMAL HIGH (ref 34–64)
NRBC: 0 (ref 0–0)
PLATELET: 234 10*3/uL (ref 140–450)
RBC: 4.48 M/uL (ref 3.60–5.20)
RDW-SD: 40.5 (ref 36.4–46.3)
WBC: 10.8 10*3/uL (ref 4.0–11.0)

## 2015-01-16 LAB — POC URINE MACROSCOPIC
Bilirubin: NEGATIVE
Blood: NEGATIVE
Glucose: >=1000 mg/dl — AB
Ketone: 40 mg/dl — AB
Leukocyte Esterase: NEGATIVE
Nitrites: NEGATIVE
Protein: 100 mg/dl — AB
Specific gravity: 1.02 (ref 1.005–1.030)
Urobilinogen: 1 EU/dl (ref 0.0–1.0)
pH (UA): 7 (ref 5–9)

## 2015-01-16 LAB — HEPATIC FUNCTION PANEL
ALT (SGPT): 15 U/L (ref 12–78)
AST (SGOT): 22 U/L (ref 15–37)
Albumin: 3.6 gm/dl (ref 3.4–5.0)
Alk. phosphatase: 134 U/L — ABNORMAL HIGH (ref 45–117)
Bilirubin, direct: 0.1 mg/dl (ref 0.0–0.2)
Bilirubin, total: 1 mg/dl (ref 0.2–1.0)
Protein, total: 8.2 gm/dl (ref 6.4–8.2)

## 2015-01-16 LAB — LIPASE: Lipase: 84 U/L (ref 73–393)

## 2015-01-16 LAB — POC HCG,URINE: HCG urine, QL: NEGATIVE

## 2015-01-16 MED ORDER — SODIUM CHLORIDE 0.9% BOLUS IV
0.9 % | INTRAVENOUS | Status: AC
Start: 2015-01-16 — End: 2015-01-16
  Administered 2015-01-16: 23:00:00 via INTRAVENOUS

## 2015-01-16 MED ORDER — PROCHLORPERAZINE EDISYLATE 5 MG/ML INJECTION
5 mg/mL | INTRAMUSCULAR | Status: AC
Start: 2015-01-16 — End: 2015-01-16
  Administered 2015-01-16: 23:00:00 via INTRAVENOUS

## 2015-01-16 MED ORDER — DIPHENHYDRAMINE HCL 50 MG/ML IJ SOLN
50 mg/mL | INTRAMUSCULAR | Status: AC
Start: 2015-01-16 — End: 2015-01-16
  Administered 2015-01-16: 23:00:00 via INTRAVENOUS

## 2015-01-16 MED FILL — DIPHENHYDRAMINE HCL 50 MG/ML IJ SOLN: 50 mg/mL | INTRAMUSCULAR | Qty: 1

## 2015-01-16 MED FILL — SODIUM CHLORIDE 0.9 % INJECTION: INTRAMUSCULAR | Qty: 10

## 2015-01-16 NOTE — ED Notes (Signed)
Patient pulled IV out by trying to go to the restroom unassisted, patient urinated on bed, bed cleaned, linen changed, patient instructed again on use of call bell, PICC team order placed to reinsert IV, Barbara CowerJason PA aware

## 2015-01-16 NOTE — ED Notes (Signed)
Patient states "I woke up this morning around 0630 with a headache and around 1030 I started throwing up, I've thrown up 5-6 times"

## 2015-01-16 NOTE — ED Provider Notes (Signed)
[facility]  Emergency Department Treatment Report        Patient: [name] [name] Age: 29 y.o. Sex: female    Date of Birth: Apr 04, 1985 Admit Date: 01/16/2015 PCP: None   MRN: [id number]  CSN: [id number]     Room: ER27/ER27 Time Dictated: 7:56 PM        Chief Complaint   Chief Complaint   Patient presents with   ??? Abdominal Pain   ??? Headache   ??? Nausea   ??? Vomiting       History of Present Illness   This is a 29 y.o. female with history of diabetes and gastroparesis who presents to ED due to 1 day of nausea, multiple episodes of vomiting and lower left quadrant abdominal pain as well as a headache in the frontal region.  Patient describes abdominal pain as a constant cramping without radiation. She states pain is worse when she is vomiting. Patient denies any diarrhea cuts patient any symptoms, chest pain short of breath or back pain. Patient states the headache is frontal region rated 7 out of 10 without radiation. Patient denies any fevers, neck pain or stiffness, visual symptoms or other symptoms. Patient's only abdominal surgery includes C-section. She is on her Depo-Provera and does not have a regular menstrual cycle.    Review of Systems   Constitutional: No fever  Eyes: No visual symptoms.  ENT: No sore throat, congestion or URI symptoms  Respiratory: No shortness of breath  Cardiovascular: No chest pain, pressure  Gastrointestinal: Nausea, vomiting, left lower quadrant abdominal pain  Genitourinary: No urinary symptoms  Musculoskeletal: No joint pain or swelling.  Integumentary: No rashes.  Neurological: Headache. No sensory or motor symptoms.  Denies complaints in all other systems.      Past Medical/Surgical History     Past Medical History   Diagnosis Date   ??? Diabetes ([place])    ??? Gastroparesis    ??? Hypertension      Past Surgical History   Procedure Laterality Date   ??? Hx cesarean section         Social History     Social History     Social History   ??? Marital status: SINGLE      Spouse name: N/A   ??? Number of children: N/A   ??? Years of education: N/A     Occupational History   ??? Not on file.     Social History Main Topics   ??? Smoking status: Never Smoker   ??? Smokeless tobacco: Not on file   ??? Alcohol use No   ??? Drug use: No   ??? Sexual activity: Yes     Birth control/ protection: Injection     Other Topics Concern   ??? Not on file     Social History Narrative       Family History   No family history on file.    Current Medications     Prior to Admission Medications   Prescriptions Last Dose Informant Patient Reported? Taking?   insulin glargine (LANTUS SOLOSTAR) 100 unit/mL (3 mL) pen 01/15/2015 at Unknown time  No Yes   Sig: 25 Units by SubCUTAneous route nightly.   insulin glulisine (APIDRA SOLOSTAR) 100 unit/mL pen 01/15/2015 at Unknown time  Yes Yes   Sig: 5 Units by SubCUTAneous route Before breakfast, lunch, and dinner. Indications: TYPE 1 DIABETES MELLITUS, pt states this is her sliding scale insulin   lisinopril (PRINIVIL, ZESTRIL) 20 mg tablet 01/15/2015  at Unknown time  Yes Yes   Sig: Take  by mouth daily.      Facility-Administered Medications: None       Allergies     Allergies   Allergen Reactions   ??? Robaxin [Methocarbamol] Other (comments)     Body shaking, nausea   ??? Penicillins Hives   ??? Zithromax [Azithromycin] Hives       Physical Exam   ED Triage Vitals   Enc Vitals Group      BP 01/16/15 1609 156/110      Pulse (Heart Rate) 01/16/15 1609 90      Resp Rate 01/16/15 1609 18      Temp 01/16/15 1609 98.1 ??F (36.7 ??C)      Temp src --       O2 Sat (%) 01/16/15 1609 100 %      Weight 01/16/15 1609 121 lb      Height 01/16/15 1609 '5\' 6"'$       Head Cir --       Peak Flow --       Pain Score --       Pain Loc --       Pain Edu? --       Excl. in [place]? --      Constitutional: Patient appears well developed and well nourished.  Appearance and behavior are age and situation appropriate.  HEENT: Conjunctiva clear.  PERRLA. Mucous membranes moist, non-erythematous. EOMs intact.   Neck: supple, non tender with no neck stiffness. Full range motion neck.  Respiratory: lungs clear to auscultation, nonlabored respirations. No tachypnea or accessory muscle use.  Cardiovascular: heart regular rate and rhythm without murmur rubs or gallops. Distal pulses 2+ and intact.  Gastrointestinal:  Abdomen soft, tender to palpation in the left lower quadrant region. No tenderness at McBurney's point. Rovsing sign negative. Murphy sign negative. No masses appreciated on inspection.  Musculoskeletal: Pt moving all 4 extremities freely  Integumentary: warm and dry without rashes or lesions  Neurologic: alert and oriented, Sensation intact, motor strength equal and symmetric.  No facial asymmetry or dysarthria. Cranial nerves II through XII intact. No focal neuro deficits.                 Impression and Management Plan   This is a 29 year old female history of gastroparesis and diabetes is presents ED due to left lower quadrant abdominal pain and headache. We'll obtain GI labs and treat patient's headache was IV Compazine, Benadryl and a bolus of normal saline.      Procedures      Diagnostic Studies   Lab:   Labs Reviewed   CBC WITH AUTOMATED DIFF - Abnormal; Notable for the following:        Result Value    HGB 12.4 (*)     MPV 12.6 (*)     NEUTROPHILS 72.3 (*)     LYMPHOCYTES 23.2 (*)     All other components within normal limits   HEPATIC FUNCTION PANEL - Abnormal; Notable for the following:     Alk. phosphatase 134 (*)     All other components within normal limits   POC URINE MACROSCOPIC - Abnormal; Notable for the following:     Glucose >=1000 (*)     Ketone 40 (*)     Protein 100 (*)     All other components within normal limits   POC CHEM8 - Abnormal; Notable for the following:     Glucose 270 (*)  All other components within normal limits   LIPASE   POC HCG,URINE   POC URINE DIPSTICK   POC URINE PREGNANCY TEST   POC CHEM8       US Breast Bi Complete    Result Date: 12/25/2014   - US BREAST BI COMPLETE ULTRASOUND OF BOTH BREASTS: 12/25/2014 CLINICAL: Palpable lumps both breasts.  Comparison is made to exam dated:  02/21/2014 [facility].  Real-time ultrasound of both breasts was performed.  Gray scale images of the real-time examination were reviewed.   Complete whole breast ultrasound was performed bilaterally including all four quadrants, the retroareolar region and the axilla.  No suspicious findings were seen.  Mammogram not performed due to patient age.  IMPRESSION: BENIGN - FOLLOW-UP RECOMMENDED There is no sonographic evidence of malignancy.  There are no sonographic abnormalities seen in either breast to correspond with the palpable nodularities, however, clinical correlation and clinical followup are recommended.  The patient will be sent the results by mail.  Findings were discussed with the patient at the time of exam. The patient was advised to return sooner for evaluation if symptoms worsen.  Corine Shelter M.D.  ns/:12/25/2014 10:30:10  letter sent: Clinical Recommended  Ultrasound BI-RADS: 2 Benign      Medical Decision Making/ ED Course   Patient does not have a high white blood cell count, she is not in DKA. High index of suspicion that symptoms are due to ongoing and chronic gastroparesis.      During the patient's stay in the ER she did not develop any new or worsening symptoms and remained stable. She was advised of diagnostic studies showing high blood sugar but no DKA. She was given a bolus of normal saline, IV Compazine, IV Benadryl as well as IM Bentyl in the ED. Patient ripped out her IV while attempting to ambulate to bathroom and urinated on the stretcher while in ED. PT was called to start another IV so patient could finish normal saline bolus fluids.  Patient was discharged and advised to follow-up with PCP in regards recent ED visit and given a prescription for Bentyl, Zofran and Naprosyn for symptoms. She  was advised return to ED for any symptom worsening.    Final Diagnosis       ICD-10-CM ICD-9-CM   1. Nausea and vomiting, intractability of vomiting not specified, unspecified vomiting type R11.2 787.01   2. Gastroparesis K31.84 536.3       Disposition     Disposition and plan  Patient was discharged home in stable condition with discharge instructions on the same.     Return to the ER if condition worsens or new symptoms develop.   Follow up with primary care as discussed.     Current Discharge Medication List      START taking these medications    Details   dicyclomine (BENTYL) 20 mg tablet Take 1 Tab by mouth every six (6) hours for 3 days.  Qty: 12 Tab, Refills: 0      ondansetron (ZOFRAN ODT) 4 mg disintegrating tablet Take 1 Tab by mouth every eight (8) hours as needed for Nausea.  Qty: 12 Tab, Refills: 0      naproxen (NAPROSYN) 500 mg tablet Take 1 Tab by mouth two (2) times daily (with meals) for 10 days.  Qty: 20 Tab, Refills: 0         CONTINUE these medications which have NOT CHANGED    Details   lisinopril (PRINIVIL, ZESTRIL) 20 mg  tablet Take  by mouth daily.      insulin glargine (LANTUS SOLOSTAR) 100 unit/mL (3 mL) pen 25 Units by SubCUTAneous route nightly.  Qty: 1 Each, Refills: 0      insulin glulisine (APIDRA SOLOSTAR) 100 unit/mL pen 5 Units by SubCUTAneous route Before breakfast, lunch, and dinner. Indications: TYPE 1 DIABETES MELLITUS, pt states this is her sliding scale insulin               The patient was personally evaluated by myself and Dr. Hermelinda Medicus who agrees with the above assessment and plan.    Dragon medical dictation software was used for portions of this report. Unintended transcription errors may occur.     Olean Ree, PA-C  January 16, 2015    My signature above authenticates this document and my orders, the final ??  diagnosis (es), discharge prescription (s), and instructions in the Epic ??  record.  If you have any questions please contact [phone].  ??   Nursing notes have been reviewed by the physician/ advanced practice ??  Clinician.

## 2015-01-16 NOTE — ED Notes (Signed)
8:38 PM  01/16/15     Discharge instructions given to patyient (name) with verbalization of understanding. Patient accompanied by self.  Patient discharged with the following prescriptions Naprosyn, Zofran, and Bentyl. Patient discharged to home (destination).      [name] [name]

## 2015-01-16 NOTE — ED Notes (Signed)
Bedside and Verbal shift change report given to Jane RN, Anthony RN, Vicki RN  (oncoming nurse) by Bailey RN, Sonny RN (offgoing nurse). Report included the following information SBAR.

## 2015-01-16 NOTE — ED Notes (Signed)
Assumed patient care from [facility] RN, and [employer] EnergySonny RN.

## 2015-01-17 MED ORDER — DICYCLOMINE 10 MG/ML IM SOLN
10 mg/mL | INTRAMUSCULAR | Status: AC
Start: 2015-01-17 — End: 2015-01-16
  Administered 2015-01-17: 01:00:00 via INTRAMUSCULAR

## 2015-01-17 MED ORDER — DICYCLOMINE 20 MG TAB
20 mg | ORAL_TABLET | Freq: Four times a day (QID) | ORAL | 0 refills | Status: AC
Start: 2015-01-17 — End: 2015-01-19

## 2015-01-17 MED ORDER — ONDANSETRON 4 MG TAB, RAPID DISSOLVE
4 mg | ORAL_TABLET | Freq: Three times a day (TID) | ORAL | 0 refills | Status: DC | PRN
Start: 2015-01-17 — End: 2016-04-20

## 2015-01-17 MED ORDER — NAPROXEN 500 MG TAB
500 mg | ORAL_TABLET | Freq: Two times a day (BID) | ORAL | 0 refills | Status: AC
Start: 2015-01-17 — End: 2015-01-26

## 2015-01-17 MED FILL — BENTYL 10 MG/ML INTRAMUSCULAR SOLUTION: 10 mg/mL | INTRAMUSCULAR | Qty: 2

## 2015-01-18 ENCOUNTER — Emergency Department: Admit: 2015-01-18 | Payer: MEDICAID

## 2015-01-18 ENCOUNTER — Inpatient Hospital Stay
Admit: 2015-01-18 | Discharge: 2015-01-19 | Disposition: A | Discharge status: Home / Self Care | Payer: MEDICAID | Attending: Emergency Medicine

## 2015-01-18 DIAGNOSIS — E1143 Type 2 diabetes mellitus with diabetic autonomic (poly)neuropathy: Secondary | ICD-10-CM

## 2015-01-18 LAB — CBC WITH AUTOMATED DIFF
BASOPHILS: 0.3 % (ref 0–3)
EOSINOPHILS: 0 % (ref 0–5)
HCT: 42.7 % (ref 37.0–50.0)
HGB: 14.1 gm/dl (ref 13.0–17.2)
IMMATURE GRANULOCYTES: 0.3 % (ref 0.0–3.0)
LYMPHOCYTES: 11.7 % — ABNORMAL LOW (ref 28–48)
MCH: 27.8 pg (ref 25.4–34.6)
MCHC: 33 gm/dl (ref 30.0–36.0)
MCV: 84.2 fL (ref 80.0–98.0)
MONOCYTES: 2.7 % (ref 1–13)
MPV: 13.1 fL — ABNORMAL HIGH (ref 6.0–10.0)
NEUTROPHILS: 85 % — ABNORMAL HIGH (ref 34–64)
NRBC: 0 (ref 0–0)
PLATELET: 307 10*3/uL (ref 140–450)
RBC: 5.07 M/uL (ref 3.60–5.20)
RDW-SD: 39.3 (ref 36.4–46.3)
WBC: 15.4 10*3/uL — ABNORMAL HIGH (ref 4.0–11.0)

## 2015-01-18 LAB — POC CHEM8
BUN: 23 mg/dl (ref 7–25)
CALCIUM,IONIZED: 4.7 mg/dL (ref 4.40–5.40)
CO2, TOTAL: 18 mmol/L — ABNORMAL LOW (ref 21–32)
Chloride: 98 mEq/L (ref 98–107)
Creatinine: 0.8 mg/dl (ref 0.6–1.3)
Glucose: 415 mg/dL — CR (ref 74–106)
HCT: 58 % — ABNORMAL HIGH (ref 38–45)
HGB: 19.7 gm/dl — ABNORMAL HIGH (ref 13.0–17.2)
Potassium: 4 mEq/L (ref 3.5–4.9)
Sodium: 133 mEq/L — ABNORMAL LOW (ref 136–145)

## 2015-01-18 MED ORDER — KETOROLAC TROMETHAMINE 15 MG/ML INJECTION
15 mg/mL | INTRAMUSCULAR | Status: AC
Start: 2015-01-18 — End: 2015-01-18
  Administered 2015-01-18: 23:00:00 via INTRAVENOUS

## 2015-01-18 MED ORDER — INSULIN LISPRO 100 UNIT/ML INJECTION
100 unit/mL | Freq: Once | SUBCUTANEOUS | Status: AC
Start: 2015-01-18 — End: 2015-01-18
  Administered 2015-01-18: 23:00:00 via SUBCUTANEOUS

## 2015-01-18 MED ORDER — SODIUM CHLORIDE 0.9% BOLUS IV
0.9 % | Freq: Once | INTRAVENOUS | Status: AC
Start: 2015-01-18 — End: 2015-01-18
  Administered 2015-01-18: 23:00:00 via INTRAVENOUS

## 2015-01-18 MED ORDER — ACETAMINOPHEN 325 MG TABLET
325 mg | ORAL | Status: DC
Start: 2015-01-18 — End: 2015-01-19

## 2015-01-18 MED ORDER — SODIUM CHLORIDE 0.9% BOLUS IV
0.9 % | INTRAVENOUS | Status: AC
Start: 2015-01-18 — End: 2015-01-18
  Administered 2015-01-18: 22:00:00 via INTRAVENOUS

## 2015-01-18 MED ORDER — PROMETHAZINE IN NS 25 MG/50 ML IV PIGGY BAG
25 mg/50 ml | INTRAVENOUS | Status: AC
Start: 2015-01-18 — End: 2015-01-18
  Administered 2015-01-18: 22:00:00 via INTRAVENOUS

## 2015-01-18 MED ORDER — SODIUM CHLORIDE 0.9 % IJ SYRG
Freq: Once | INTRAMUSCULAR | Status: AC
Start: 2015-01-18 — End: 2015-01-18
  Administered 2015-01-18: 22:00:00 via INTRAVENOUS

## 2015-01-18 MED FILL — ACETAMINOPHEN 325 MG TABLET: 325 mg | ORAL | Qty: 3

## 2015-01-18 MED FILL — KETOROLAC TROMETHAMINE 15 MG/ML INJECTION: 15 mg/mL | INTRAMUSCULAR | Qty: 1

## 2015-01-18 MED FILL — INSULIN LISPRO 100 UNIT/ML INJECTION: 100 unit/mL | SUBCUTANEOUS | Qty: 1

## 2015-01-18 MED FILL — PROMETHAZINE IN NS 25 MG/50 ML IV PIGGY BAG: 25 mg/50 ml | INTRAVENOUS | Qty: 50

## 2015-01-18 NOTE — ED Notes (Signed)
9:07 PM  01/18/15     Discharge instructions given to [name] [name] (name) with verbalization of understanding. Patient accompanied by family.  Patient discharged with the following prescriptions none. Patient discharged to home (destination).      Baxter KailPeyton M Reynolds

## 2015-01-18 NOTE — ED Triage Notes (Signed)
Pt states that she woke up this morning feeling bad; went to the bathroom and passed out in the bathroom. Pt states that she was here a couple of days ago for gastroparesis. Pt states that she still can't keep any food down and is only eating ice chips. Pt states that her left hip started to hurt a couple of hours ago, constant pain, and her left leg feel numb.

## 2015-01-18 NOTE — ED Notes (Signed)
A&O x4 and follows commands appropriately. RR even and nonlabored. Skin warm dry and no abnormal skin color noted.    States that left hip pain is 8/10. No loss of ROM    States that she ususally takes 20mg  PO Lisinipril daily for HTN.    States that she has not been able to take her meds dues to nausea and vomiting x3 days.

## 2015-01-18 NOTE — ED Notes (Signed)
Pt reports not feeling well for 2 days with nausea and vomiting, unable to keep anything down.Today had LLE numbness and left hip pain. Also reports syncopal episode while walking to the bathroom PTA. Glucose 485mg /dl per ems.

## 2015-01-18 NOTE — ED Provider Notes (Signed)
[facility]  Emergency Department Treatment Report        Patient: [name] [name] Age: 29 y.o. Sex: female    Date of Birth: 1985-03-31 Admit Date: 01/18/2015 PCP: None   MRN: [id number]  CSN: [id number]     Room: ER23/ER23 Time Dictated: 5:29 PM        Chief Complaint   States he still having vomiting and not eating    History of Present Illness   29 y.o. female patient has not been eating or drinking much for 2 days she was just in the ED for her gastroparesis 2 days ago and states has not been able to hold anything down. Patient states she vomited 3 times today, her blood sugar was normal this morning she did not take her insulin because she did not eat and states EMS checked her sugar and it was high. Patient having no fever no complaints of any abdominal pain but complains of pain left hip after a fall today. Patient woke up states her left eye felt numb and she fell and she's been having some left hip pain since that time. Patient rates the pain as an 8 on a scale 1-10 nonradiating, worse with ambulation.no head trauma, does not know if she was briefly unconscious     Review of Systems   Review of Systems   Constitutional: Negative for fever.   HENT: Negative for nosebleeds.    Eyes: Negative for discharge.   Respiratory: Negative for shortness of breath.    Cardiovascular: Negative for chest pain.   Gastrointestinal: Positive for vomiting. Negative for abdominal pain and diarrhea.   Genitourinary: Negative for dysuria.   Musculoskeletal:        Pain left hip   Skin: Negative for rash.   Neurological: Negative for seizures.   Endo/Heme/Allergies:        No hemetemesis       Past Medical/Surgical History     Past Medical History   Diagnosis Date   ??? Diabetes (HCC)    ??? Gastroparesis    ??? Hypertension      Past Surgical History   Procedure Laterality Date   ??? Hx cesarean section         Social History     Social History     Social History   ??? Marital status: SINGLE     Spouse name: N/A    ??? Number of children: N/A   ??? Years of education: N/A     Occupational History   ??? Not on file.     Social History Main Topics   ??? Smoking status: Never Smoker   ??? Smokeless tobacco: Not on file   ??? Alcohol use No   ??? Drug use: No   ??? Sexual activity: Yes     Birth control/ protection: Injection     Other Topics Concern   ??? Not on file     Social History Narrative       Family History   Positive for diabetes     Current Medications     Prior to Admission Medications   Prescriptions Last Dose Informant Patient Reported? Taking?   dicyclomine (BENTYL) 20 mg tablet 01/15/2015  No Yes   Sig: Take 1 Tab by mouth every six (6) hours for 3 days.   insulin glargine (LANTUS SOLOSTAR) 100 unit/mL (3 mL) pen 01/17/2015 at Unknown time  No Yes   Sig: 25 Units by SubCUTAneous route nightly.   insulin  glulisine (APIDRA SOLOSTAR) 100 unit/mL pen 01/17/2015 at Unknown time  Yes Yes   Sig: 5 Units by SubCUTAneous route Before breakfast, lunch, and dinner. Indications: TYPE 1 DIABETES MELLITUS, pt states this is her sliding scale insulin   lisinopril (PRINIVIL, ZESTRIL) 20 mg tablet 01/15/2015  Yes Yes   Sig: Take  by mouth daily.   naproxen (NAPROSYN) 500 mg tablet 01/15/2015  No Yes   Sig: Take 1 Tab by mouth two (2) times daily (with meals) for 10 days.   ondansetron (ZOFRAN ODT) 4 mg disintegrating tablet 01/11/2015 at Unknown time  No Yes   Sig: Take 1 Tab by mouth every eight (8) hours as needed for Nausea.      Facility-Administered Medications: None     Allergies     Allergies   Allergen Reactions   ??? Robaxin [Methocarbamol] Other (comments)     Body shaking, nausea   ??? Penicillins Hives   ??? Zithromax [Azithromycin] Hives       Physical Exam   ED Triage Vitals   Enc Vitals Group      BP 01/18/15 1403 177/81      Pulse (Heart Rate) 01/18/15 1403 107      Resp Rate 01/18/15 1403 16      Temp 01/18/15 1403 99 ??F (37.2 ??C)      Temp src --       O2 Sat (%) 01/18/15 1403 100 %      Weight 01/18/15 1355 117 lb       Height 01/18/15 1355 5\' 6"      Physical Exam   Constitutional: She is oriented to person, place, and time and well-developed, well-nourished, and in no distress. No distress.   HENT:   Head: Normocephalic and atraumatic.   Mouth/Throat: Oropharynx is clear and moist.   Eyes: Conjunctivae are normal. Pupils are equal, round, and reactive to light. No scleral icterus.   Neck: Neck supple.   Cardiovascular: Normal rate, regular rhythm and normal heart sounds.    No murmur heard.  Pulmonary/Chest: Effort normal and breath sounds normal. No respiratory distress. She has no wheezes. She has no rales.   Abdominal: Soft. Bowel sounds are normal. She exhibits no distension. There is no tenderness. There is no rebound and no guarding.   Musculoskeletal: She exhibits no deformity.   Complains of tenderness left buttock no ecchymosis or edema no external rotation or shortening.   Lymphadenopathy:     She has no cervical adenopathy.   Neurological: She is alert and oriented to person, place, and time.   Ambulating with slight limp due to left leg pain   Skin: Skin is warm and dry. No rash noted.        Impression and Management Plan   Patient who is a diabetic with history of gastroparesis complaining of ongoing vomiting and elevated blood sugar we will check electrolytes and hydrate the patient and treat her symptomatically , she has a benign abdominal exam     Diagnostic Studies   Lab:   Recent Results (from the past 12 hour(s))   POC CHEM8    Collection Time: 01/18/15  5:20 PM   Result Value Ref Range    Sodium 133 (L) 136 - 145 mEq/L    Potassium 4.0 3.5 - 4.9 mEq/L    Chloride 98 98 - 107 mEq/L    CO2, TOTAL 18 (L) 21 - 32 mmol/L    Glucose 415 (HH) 74 - 106 mg/dL  BUN 23 7 - 25 mg/dl    Creatinine 0.8 0.6 - 1.3 mg/dl    HCT 58 (H) 38 - 45 %    HGB 19.7 (H) 13.0 - 17.2 gm/dl    CALCIUM,IONIZED 1.61 4.40 - 5.40 mg/dL     Labs Reviewed   POC CHEM8 - Abnormal; Notable for the following:        Result Value     Sodium 133 (*)     CO2, TOTAL 18 (*)     Glucose 415 (*)     HCT 58 (*)     HGB 19.7 (*)     All other components within normal limits   POC [facility]       ED Course   She given IV fluids and IV Phenergan .Tylenol by mouth was ordered for the hip pain and she stated she could not take anything by mouth she was given Toradol IV    patient signed out to Dr. Jimmey Ralph awaiting further treatment of the hyperglycemia and hydrate     The patient was personally evaluated by myself and Dr. Jimmey Ralph who agrees with the above assessment and plan.    Dorise Hiss, PA  January 18, 2015     ATTENDING PA SUPERVISION NOTE:  I interviewed and examined the patient independently.  I discussed with the Advanced Practice Provider and agree with their evaluation and plan as documented here.      Continued ED Course by Dr Floyce Stakes   Continuation of the note by PA  [facility].  Patient presents with gastroparesis and n/v exacerbation.  This is an acute exacerbation of a chronic problem.   She received IV fluids (two boluses), ativan, benadryl, and phenergan with significant improvement.  The ativan gave her the most improvement.  Her labs are consistent with dehydration.  Xray was negative. I gave her 10 units of insulin for her elevated blood glucose.  Her blood pressure is coming down with symptomatic control, and she wants to take her meds at home    Her abdominal pain is chronic and recurrent.  The patient has a relatively benign exam with no signs of surgical pathology.  Her wbc is elevated but this seems reactionary.  After treatment, she is tolerating po.  At this time there is no indication for CT scan as the exam and workup do not point to an acute cause that would require surgery or admission.  The patient was advised that should their condition change they are to return to the ER immediately.  The patient is discharged home with verbal and written instructions and a referral for ongoing care.  The patient is  aware that they may return at any time for new or worsening symptoms and understand their responsibility to do so if there are any concerns.  The patient verbalized understanding of their discharge instructions.   Prescriptions were provided as appropriate.      .      Final Diagnosis       ICD-10-CM ICD-9-CM   1. Gastroparesis due to DM (HCC) E11.43 250.60    K31.84 536.3   2. Non-intractable cyclical vomiting with nausea G43.A0 536.2   3. Dehydration E86.0 276.51   4. Chronic abdominal pain R10.9 789.00    G89.29 338.29     Disposition     The patient is discharged home with verbal and written instructions and a referral for ongoing care.  The patient is aware that they may return at  any time for new or worsening symptoms and understand their responsibility to do so if there are any concerns.  The patient verbalized understanding of their discharge instructions.   Prescriptions were provided as appropriate.        Electronically signed by Johny Drillingodd A Jorie Zee, MD  6:34 AM  January 24, 2015      My signature above authenticates this document and my orders, the final ??  diagnosis (es), discharge prescription (s), and instructions in the Epic ??  record.  If you have any questions please contact [phone].  ??  Nursing notes have been reviewed by the physician/ advanced practice ??  Clinician.    Dragon medical dictation software was used for portions of this report. Unintended voice recognition errors may occur.

## 2015-01-19 MED ORDER — LORAZEPAM 2 MG/ML IJ SOLN
2 mg/mL | Freq: Once | INTRAMUSCULAR | Status: AC
Start: 2015-01-19 — End: 2015-01-18
  Administered 2015-01-19: 01:00:00 via INTRAVENOUS

## 2015-01-19 MED ORDER — DIPHENHYDRAMINE HCL 50 MG/ML IJ SOLN
50 mg/mL | INTRAMUSCULAR | Status: AC
Start: 2015-01-19 — End: 2015-01-18
  Administered 2015-01-19: 01:00:00 via INTRAVENOUS

## 2015-01-19 MED FILL — DIPHENHYDRAMINE HCL 50 MG/ML IJ SOLN: 50 mg/mL | INTRAMUSCULAR | Qty: 1

## 2015-01-19 MED FILL — LORAZEPAM 2 MG/ML IJ SOLN: 2 mg/mL | INTRAMUSCULAR | Qty: 1

## 2015-01-20 DIAGNOSIS — K3184 Gastroparesis: Secondary | ICD-10-CM

## 2015-01-20 NOTE — ED Provider Notes (Signed)
[facility]  Emergency Department Treatment Report        Patient: [name] FlakeDevale S [name] Age: 29 y.o. Sex: female    Date of Birth: 1986-01-07 Admit Date: 01/20/2015 PCP: None   MRN: [id number]  CSN: [id number]     Room: ER34/ER34 Time Dictated: 8:30 PM          Chief Complaint   Nausea and vomiting    History of Present Illness   This is a 29 y.o. female with a long-standing history of severe gastroparesis. She has poorly controlled diabetes. She has run out of her Lantus. She has been here per her report twice this week because of gastroparesis signs and symptoms. She states these continued despite her medications. She states she can't eat or drink. She complains of moderate generalized abdominal pain.  She denies fevers chills. She states trying to eat or drink anything makes her feel worse. Nothing seems to make her feel better. She figured that since she was here to get a Lantus prescription she would simply check and to get some symptomatic relief.    Review of Systems   Review of Systems   Constitutional: Negative for chills, diaphoresis and fever.   HENT: Negative for sore throat.    Eyes: Negative for blurred vision.   Respiratory: Negative for shortness of breath.    Cardiovascular: Negative for chest pain.   Gastrointestinal: Positive for abdominal pain, nausea and vomiting. Negative for diarrhea.   Genitourinary: Negative for dysuria.   Musculoskeletal: Negative for myalgias.   Skin: Negative for rash.   Neurological: Negative for focal weakness.   Endo/Heme/Allergies: Negative for polydipsia.       Past Medical/Surgical History     Past Medical History   Diagnosis Date   ??? Diabetes (HCC)    ??? Gastroparesis    ??? Hypertension      Past Surgical History   Procedure Laterality Date   ??? Hx cesarean section         Social History     Social History     Social History   ??? Marital status: SINGLE     Spouse name: N/A   ??? Number of children: N/A   ??? Years of education: N/A     Occupational History    ??? Not on file.     Social History Main Topics   ??? Smoking status: Never Smoker   ??? Smokeless tobacco: Not on file   ??? Alcohol use No   ??? Drug use: No   ??? Sexual activity: Yes     Birth control/ protection: Injection     Other Topics Concern   ??? Not on file     Social History Narrative     Family History   History reviewed. No pertinent family history.  Diabetes  Current Medications     Current Facility-Administered Medications   Medication Dose Route Frequency Provider Last Rate Last Dose   ??? prochlorperazine (COMPAZINE) 5 mg/mL injection        Stopped at 01/20/15 2036     Current Outpatient Prescriptions   Medication Sig Dispense Refill   ??? insulin glargine (LANTUS SOLOSTAR) 100 unit/mL (3 mL) pen 25 Units by SubCUTAneous route nightly. 1 Each 2   ??? ondansetron (ZOFRAN ODT) 4 mg disintegrating tablet Take 1 Tab by mouth every eight (8) hours as needed for Nausea. 12 Tab 0   ??? naproxen (NAPROSYN) 500 mg tablet Take 1 Tab by mouth two (2) times daily (  with meals) for 10 days. 20 Tab 0   ??? lisinopril (PRINIVIL, ZESTRIL) 20 mg tablet Take  by mouth daily.     ??? insulin glulisine (APIDRA SOLOSTAR) 100 unit/mL pen 5 Units by SubCUTAneous route Before breakfast, lunch, and dinner. Indications: TYPE 1 DIABETES MELLITUS, pt states this is her sliding scale insulin         Allergies     Allergies   Allergen Reactions   ??? Robaxin [Methocarbamol] Other (comments)     Body shaking, nausea   ??? Penicillins Hives   ??? Zithromax [Azithromycin] Hives       Physical Exam     Patient Vitals for the past 24 hrs:   Temp Pulse Resp BP SpO2   01/20/15 1933 98.7 ??F (37.1 ??C) (!) 103 16 (!) 154/93 99 %     Physical Exam   Constitutional: She is oriented to person, place, and time and well-developed, well-nourished, and in no distress.   HENT:   Head: Normocephalic and atraumatic.   Dry mucous membranes   Neck: Normal range of motion. Neck supple.   Cardiovascular: Normal rate, regular rhythm, normal heart sounds and intact distal pulses.     Pulmonary/Chest: Effort normal and breath sounds normal. No respiratory distress.   Abdominal: Soft. She exhibits no distension. There is no tenderness.   Musculoskeletal: Normal range of motion. She exhibits no edema.   Neurological: She is alert and oriented to person, place, and time. GCS score is 15.   Skin: Skin is warm and dry.   Psychiatric: Affect normal.        Impression and Management Plan   Patient with gastroparesis. Treat her symptomatically. We'll ensure that there are no complications related to her diabetes    Diagnostic Studies   Lab:   Recent Results (from the past 12 hour(s))   POC CHEM8    Collection Time: 01/20/15  8:27 PM   Result Value Ref Range    Sodium 133 (L) 136 - 145 mEq/L    Potassium 4.0 3.5 - 4.9 mEq/L    Chloride 100 98 - 107 mEq/L    CO2, TOTAL 18 (L) 21 - 32 mmol/L    Glucose 306 (H) 74 - 106 mg/dL    BUN 16 7 - 25 mg/dl    Creatinine 0.9 0.6 - 1.3 mg/dl    HCT 42 38 - 45 %    HGB 14.3 13.0 - 17.2 gm/dl    CALCIUM,IONIZED 1.30 4.40 - 5.40 mg/dL   POC CHEM8    Collection Time: 01/20/15 10:15 PM   Result Value Ref Range    Sodium 133 (L) 136 - 145 mEq/L    Potassium 7.3 (HH) 3.5 - 4.9 mEq/L    Chloride 108 (H) 98 - 107 mEq/L    CO2, TOTAL 17 (L) 21 - 32 mmol/L    Glucose 148 (H) 74 - 106 mg/dL    BUN 17 7 - 25 mg/dl    Creatinine 0.8 0.6 - 1.3 mg/dl    HCT 34 (L) 38 - 45 %    HGB 11.6 (L) 13.0 - 17.2 gm/dl    CALCIUM,IONIZED 8.65 (L) 4.40 - 5.40 mg/dL     Labs Reviewed   POC CHEM8 - Abnormal; Notable for the following:        Result Value    Sodium 133 (*)     CO2, TOTAL 18 (*)     Glucose 306 (*)     All other components within  normal limits   POC CHEM8 - Abnormal; Notable for the following:     Sodium 133 (*)     Potassium 7.3 (*)     Chloride 108 (*)     CO2, TOTAL 17 (*)     Glucose 148 (*)     HCT 34 (*)     HGB 11.6 (*)     CALCIUM,IONIZED 3.90 (*)     All other components within normal limits   POC CHEM8   POC CHEM8       Imaging:    No results found.    EKG:        ED Course   The patient remained stable. She was given IV Benadryl and Compazine. She did not vomit while here. She received IV fluids. I reviewed her results. She has no anion gap. She has  received IV insulin. She looks well. I've refilled her Lantus prescription. I've asked that she follow up with her doctor  Medical Decision Making     Final Diagnosis       ICD-10-CM ICD-9-CM   1. Gastroparesis K31.84 536.3   2. Hyperglycemia R73.9 790.29   3. Dehydration E86.0 276.51       Disposition   Home    Christiana Pellant, MD  January 20, 2015    My signature above authenticates this document and my orders, the final ??  diagnosis (es), discharge prescription (s), and instructions in the Epic ??  record.  If you have any questions please contact [phone].  ??  Nursing notes have been reviewed by the physician/ advanced practice ??  Clinician.

## 2015-01-20 NOTE — ED Triage Notes (Signed)
Pt is A&OX3 airway patent resting on str without signs of distress. Pt reports that she started Tuesday with NV. Pt reports that she has a hx of gastroparesis, and that she has not had it last this long. Pt reports that she missed her evening insulin because she ran out.

## 2015-01-20 NOTE — ED Notes (Signed)
Pt is A&OX3 airway patent resting on str without signs of distress. Pt given discharge instructions and a prescription for lantus. Pt expressed understanding of such information. IV removed and pressure dressing applied. Pt ambulatory from department.

## 2015-01-20 NOTE — ED Triage Notes (Signed)
Out of her insulin.  Checked her blood glucose earlier today and it was 157

## 2015-01-21 ENCOUNTER — Inpatient Hospital Stay
Admit: 2015-01-21 | Discharge: 2015-01-21 | Disposition: A | Discharge status: Home / Self Care | Payer: MEDICAID | Attending: Emergency Medicine

## 2015-01-21 LAB — POC CHEM8
BUN: 16 mg/dl (ref 7–25)
BUN: 17 mg/dl (ref 7–25)
CALCIUM,IONIZED: 4.5 mg/dL (ref 4.40–5.40)
CALCIUM,IONIZED: 3.9 mg/dL — ABNORMAL LOW (ref 4.40–5.40)
CO2, TOTAL: 18 mmol/L — ABNORMAL LOW (ref 21–32)
CO2, TOTAL: 17 mmol/L — ABNORMAL LOW (ref 21–32)
Chloride: 100 mEq/L (ref 98–107)
Chloride: 108 mEq/L — ABNORMAL HIGH (ref 98–107)
Creatinine: 0.9 mg/dl (ref 0.6–1.3)
Creatinine: 0.8 mg/dl (ref 0.6–1.3)
Glucose: 306 mg/dL — ABNORMAL HIGH (ref 74–106)
Glucose: 148 mg/dL — ABNORMAL HIGH (ref 74–106)
HCT: 42 % (ref 38–45)
HCT: 34 % — ABNORMAL LOW (ref 38–45)
HGB: 14.3 gm/dl (ref 13.0–17.2)
HGB: 11.6 gm/dl — ABNORMAL LOW (ref 13.0–17.2)
Potassium: 4 mEq/L (ref 3.5–4.9)
Potassium: 7.3 mEq/L — CR (ref 3.5–4.9)
Sodium: 133 mEq/L — ABNORMAL LOW (ref 136–145)
Sodium: 133 mEq/L — ABNORMAL LOW (ref 136–145)

## 2015-01-21 MED ORDER — SODIUM CHLORIDE 0.9% BOLUS IV
0.9 % | INTRAVENOUS | Status: AC
Start: 2015-01-21 — End: 2015-01-20
  Administered 2015-01-21: 02:00:00 via INTRAVENOUS

## 2015-01-21 MED ORDER — PROCHLORPERAZINE EDISYLATE 5 MG/ML INJECTION
5 mg/mL | INTRAMUSCULAR | Status: AC
Start: 2015-01-21 — End: 2015-01-20
  Administered 2015-01-21: 02:00:00 via INTRAVENOUS

## 2015-01-21 MED ORDER — INSULIN GLARGINE 100 UNIT/ML (3 ML) SUB-Q PEN
100 unit/mL (3 mL) | Freq: Every evening | SUBCUTANEOUS | 2 refills | Status: DC
Start: 2015-01-21 — End: 2016-04-20

## 2015-01-21 MED ORDER — INSULIN REGULAR HUMAN 100 UNIT/ML INJECTION
100 unit/mL | INTRAMUSCULAR | Status: AC
Start: 2015-01-21 — End: 2015-01-20
  Administered 2015-01-21: 02:00:00 via INTRAVENOUS

## 2015-01-21 MED ORDER — DIPHENHYDRAMINE HCL 50 MG/ML IJ SOLN
50 mg/mL | Freq: Once | INTRAMUSCULAR | Status: AC
Start: 2015-01-21 — End: 2015-01-20
  Administered 2015-01-21: 02:00:00 via INTRAVENOUS

## 2015-01-21 MED ORDER — SODIUM CHLORIDE 0.9% BOLUS IV
0.9 % | Freq: Once | INTRAVENOUS | Status: AC
Start: 2015-01-21 — End: 2015-01-20
  Administered 2015-01-21: 03:00:00 via INTRAVENOUS

## 2015-01-21 MED ORDER — PROCHLORPERAZINE EDISYLATE 5 MG/ML INJECTION
5 mg/mL | INTRAMUSCULAR | Status: DC
Start: 2015-01-21 — End: 2015-01-21

## 2015-01-21 MED FILL — DIPHENHYDRAMINE HCL 50 MG/ML IJ SOLN: 50 mg/mL | INTRAMUSCULAR | Qty: 1

## 2015-01-21 MED FILL — SODIUM CHLORIDE 0.9 % INJECTION: INTRAMUSCULAR | Qty: 10

## 2015-01-21 MED FILL — INSULIN REGULAR HUMAN 100 UNIT/ML INJECTION: 100 unit/mL | INTRAMUSCULAR | Qty: 1

## 2015-01-21 MED FILL — PROCHLORPERAZINE EDISYLATE 5 MG/ML INJECTION: 5 mg/mL | INTRAMUSCULAR | Qty: 2

## 2015-02-07 NOTE — Anesthesia Pre-Procedure Evaluation (Addendum)
Anesthetic History     PONV          Review of Systems / Medical History  Patient summary reviewed, nursing notes reviewed and pertinent labs reviewed    Pulmonary  Within defined limits                 Neuro/Psych   Within defined limits           Cardiovascular    Hypertension: well controlled              Exercise tolerance: >4 METS     GI/Hepatic/Renal  Within defined limits             Comments: Gastroparesis Endo/Other    Diabetes: well controlled, type 1        Comments: Type I for 11 years Other Findings            Physical Exam    Airway  Mallampati: II  TM Distance: 4 - 6 cm  Neck ROM: normal range of motion   Mouth opening: Normal     Cardiovascular  Regular rate and rhythm,  S1 and S2 normal,  no murmur, click, rub, or gallop  Rhythm: regular  Rate: normal         Dental         Pulmonary  Breath sounds clear to auscultation               Abdominal  GI exam deferred       Other Findings            Anesthetic Plan    ASA: 3  Anesthesia type: general          Induction: Intravenous  Anesthetic plan and risks discussed with: Patient

## 2015-02-08 ENCOUNTER — Inpatient Hospital Stay
Admit: 2015-02-08 | Discharge: 2015-02-08 | Disposition: A | Discharge status: Home / Self Care | Payer: MEDICAID | Attending: Emergency Medicine

## 2015-02-08 ENCOUNTER — Inpatient Hospital Stay: Payer: MEDICAID

## 2015-02-08 ENCOUNTER — Emergency Department: Admit: 2015-02-08 | Payer: MEDICAID

## 2015-02-08 DIAGNOSIS — R1013 Epigastric pain: Secondary | ICD-10-CM

## 2015-02-08 LAB — POC CHEM8
BUN: 10 mg/dl (ref 7–25)
CALCIUM,IONIZED: 4.6 mg/dL (ref 4.40–5.40)
CO2, TOTAL: 21 mmol/L (ref 21–32)
Chloride: 102 mEq/L (ref 98–107)
Creatinine: 0.6 mg/dl (ref 0.6–1.3)
Glucose: 312 mg/dL — ABNORMAL HIGH (ref 74–106)
HCT: 38 % (ref 38–45)
HGB: 12.9 gm/dl — ABNORMAL LOW (ref 13.0–17.2)
Potassium: 3.7 mEq/L (ref 3.5–4.9)
Sodium: 136 mEq/L (ref 136–145)

## 2015-02-08 LAB — POC HCG,URINE: HCG urine, QL: NEGATIVE

## 2015-02-08 LAB — LIPASE: Lipase: 120 U/L (ref 73–393)

## 2015-02-08 MED ORDER — DIPHENHYDRAMINE HCL 50 MG/ML IJ SOLN
50 mg/mL | INTRAMUSCULAR | Status: AC
Start: 2015-02-08 — End: 2015-02-08
  Administered 2015-02-08: 19:00:00 via INTRAVENOUS

## 2015-02-08 MED ORDER — SODIUM CHLORIDE 0.9 % IV
INTRAVENOUS | Status: DC | PRN
Start: 2015-02-08 — End: 2015-02-08
  Administered 2015-02-08: 13:00:00 via INTRAVENOUS

## 2015-02-08 MED ORDER — PROPOFOL 10 MG/ML IV EMUL
10 mg/mL | INTRAVENOUS | Status: DC | PRN
Start: 2015-02-08 — End: 2015-02-08
  Administered 2015-02-08 (×2): via INTRAVENOUS

## 2015-02-08 MED ORDER — SODIUM CHLORIDE 0.9 % IJ SYRG
INTRAMUSCULAR | Status: DC | PRN
Start: 2015-02-08 — End: 2015-02-08

## 2015-02-08 MED ORDER — SODIUM CHLORIDE 0.9 % IV
5 mg/mL | INTRAVENOUS | Status: DC
Start: 2015-02-08 — End: 2015-02-08

## 2015-02-08 MED ORDER — SODIUM CHLORIDE 0.9 % IJ SYRG
Freq: Three times a day (TID) | INTRAMUSCULAR | Status: DC
Start: 2015-02-08 — End: 2015-02-08

## 2015-02-08 MED ORDER — KETAMINE 50 MG/ML (1 ML) INTRAVENOUS SYRINGE
50 mg/mL (1 mL) | INTRAVENOUS | Status: AC
Start: 2015-02-08 — End: ?

## 2015-02-08 MED ORDER — ONDANSETRON (PF) 4 MG/2 ML INJECTION
4 mg/2 mL | INTRAMUSCULAR | Status: DC | PRN
Start: 2015-02-08 — End: 2015-02-08
  Administered 2015-02-08: 14:00:00 via INTRAVENOUS

## 2015-02-08 MED ORDER — GLYCOPYRROLATE 0.2 MG/ML IJ SOLN
0.2 mg/mL | INTRAMUSCULAR | Status: DC | PRN
Start: 2015-02-08 — End: 2015-02-08
  Administered 2015-02-08: 14:00:00 via INTRAVENOUS

## 2015-02-08 MED ORDER — LIDOCAINE 4 % TOPICAL SOLN
4 % (0 mg/mL) | Status: DC | PRN
Start: 2015-02-08 — End: 2015-02-08
  Administered 2015-02-08: 13:00:00 via TOPICAL

## 2015-02-08 MED ORDER — SODIUM CHLORIDE 0.9 % IJ SYRG
Freq: Once | INTRAMUSCULAR | Status: AC
Start: 2015-02-08 — End: 2015-02-08
  Administered 2015-02-08: 19:00:00 via INTRAVENOUS

## 2015-02-08 MED ORDER — KETAMINE 50 MG/ML IJ SOLN
50 mg/mL | INTRAMUSCULAR | Status: DC | PRN
Start: 2015-02-08 — End: 2015-02-08
  Administered 2015-02-08: 14:00:00 via INTRAVENOUS

## 2015-02-08 MED ORDER — SODIUM CHLORIDE 0.9 % IV
INTRAVENOUS | Status: DC
Start: 2015-02-08 — End: 2015-02-08

## 2015-02-08 MED ORDER — LORAZEPAM 2 MG/ML IJ SOLN
2 mg/mL | INTRAMUSCULAR | Status: AC
Start: 2015-02-08 — End: 2015-02-08
  Administered 2015-02-08: 19:00:00 via INTRAVENOUS

## 2015-02-08 MED ORDER — SODIUM CHLORIDE 0.9% BOLUS IV
0.9 % | INTRAVENOUS | Status: AC
Start: 2015-02-08 — End: 2015-02-08
  Administered 2015-02-08: 19:00:00 via INTRAVENOUS

## 2015-02-08 MED ORDER — METOCLOPRAMIDE 5 MG/ML IJ SOLN
5 mg/mL | Freq: Once | INTRAMUSCULAR | Status: AC
Start: 2015-02-08 — End: 2015-02-08
  Administered 2015-02-08: 19:00:00 via INTRAVENOUS

## 2015-02-08 MED FILL — DIPHENHYDRAMINE HCL 50 MG/ML IJ SOLN: 50 mg/mL | INTRAMUSCULAR | Qty: 1

## 2015-02-08 MED FILL — METOCLOPRAMIDE 5 MG/ML IJ SOLN: 5 mg/mL | INTRAMUSCULAR | Qty: 2

## 2015-02-08 MED FILL — SODIUM CHLORIDE 0.9 % IV: INTRAVENOUS | Qty: 1000

## 2015-02-08 MED FILL — KETAMINE 50 MG/ML (1 ML) INTRAVENOUS SYRINGE: 50 mg/mL (1 mL) | INTRAVENOUS | Qty: 1

## 2015-02-08 MED FILL — LORAZEPAM 2 MG/ML IJ SOLN: 2 mg/mL | INTRAMUSCULAR | Qty: 1

## 2015-02-08 NOTE — ED Notes (Signed)
Assumed care of pt, pt walking in halls.  Pt stated she "passed out in the lobby".  Pt had EGD this morning, was discharged, and returned due to vomiting since 1030, hx of gastrophoresis

## 2015-02-08 NOTE — Other (Signed)
5:01 PM  02/08/15     Discharge instructions given to [name] [name] (name) with verbalization of understanding. Patient accompanied by mother.  Patient discharged with the following prescriptions none. Patient discharged to home (destination).      Sena HitchKaren L Heath, RN

## 2015-02-08 NOTE — Progress Notes (Signed)
h and p unchanged

## 2015-02-08 NOTE — Anesthesia Post-Procedure Evaluation (Signed)
Post-Anesthesia Evaluation and Assessment    Patient: [name] [name] MRN: [id number]  SSN: [id number]    Date of Birth: 1985-02-24  Age: 30 y.o.  Sex: female       Cardiovascular Function/Vital Signs  Visit Vitals   ??? BP (!) 161/101   ??? Pulse 96   ??? Temp 37.2 ??C (99 ??F)   ??? Resp 16   ??? Ht 5\' 6"  (1.676 m)   ??? Wt 58.2 kg (128 lb 4.9 oz)   ??? SpO2 100%   ??? BMI 20.71 kg/m2       Patient is status post general anesthesia for Procedure(s):  EGD w bx.    Nausea/Vomiting: None    Postoperative hydration reviewed and adequate.    Pain:  Pain Scale 1: Visual (02/08/15 0929)  Pain Intensity 1: 0 (02/08/15 0929)   Managed    Neurological Status:   Neuro (WDL): Within Defined Limits (02/08/15 0908)  Neuro  Neurologic State: Alert;Appropriate for age (02/08/15 0908)   At baseline    Mental Status and Level of Consciousness: Arousable    Pulmonary Status:   O2 Device: Room air (02/08/15 0929)   Adequate oxygenation and airway patent    Complications related to anesthesia: None    Post-anesthesia assessment completed. No concerns    Signed By: Vickey HugerJames Deeksha Cotrell, MD     February 08, 2015

## 2015-02-08 NOTE — Other (Signed)
Assisted pt up to restroom, voided

## 2015-02-08 NOTE — ED Provider Notes (Signed)
[facility]  Emergency Department Treatment Report    Patient: [name] [name] Age: 30 y.o. Sex: female    Date of Birth: January 20, 1986 Admit Date: 02/08/2015 PCP: None   MRN: [id number]  CSN: [id number]     Room: OTF/OTF Time Dictated: 1:15 PM      Chief Complaint   Vomiting    History of Present Illness   30 y.o. female well-known to our facility, history of diabetes and gastroparesis, presenting to the emergency department today secondary to vomiting. Patient underwent an upper endoscopy earlier this morning. After the procedure she reports being nauseated but was discharged home. He is being home she has had at least 7 episodes of vomiting. Symptoms associated with epigastric sharp and burning pain. She denies any hematemesis. She tried taking her prescribed anti-medic without relief and therefore is here for further evaluation.    Review of Systems   ROS  Constitutional:  No fever, chills, or weight loss  Eyes: No visual symptoms.  ENT: No sore throat, runny nose or ear pain.  Respiratory: No cough, dyspnea or wheezing.  Cardoivascular: Reports mild burning in the chest without any palpitations  Gastrointestinal: Reports sharp burning epigastric pain and vomiting, denies diarrhea  Genitourinary: No dysuria, frequency, or urgency.  Musculoskeletal: No joint pain or swelling.  Integumentary: No rashes.  Neurological: No headaches, sensory or motor symptoms.  Denies complaints in all other systems.    Past Medical/Surgical History     Past Medical History   Diagnosis Date   ??? Diabetes (HCC)    ??? Gastroparesis    ??? Hypertension      Past Surgical History   Procedure Laterality Date   ??? Hx cesarean section       Social History     Social History     Social History   ??? Marital status: SINGLE     Spouse name: N/A   ??? Number of children: N/A   ??? Years of education: N/A     Social History Main Topics   ??? Smoking status: Never Smoker   ??? Smokeless tobacco: None   ??? Alcohol use No   ??? Drug use: No    ??? Sexual activity: Yes     Birth control/ protection: Injection     Other Topics Concern   ??? None     Social History Narrative     Family History   History reviewed. No pertinent family history.  Esther diabetes in her father  Home Medications     Prior to Admission Medications   Prescriptions Last Dose Informant Patient Reported? Taking?   METOCLOPRAMIDE HCL (REGLAN PO)   Yes No   Sig: Take 5 mL by mouth daily.   insulin glargine (LANTUS SOLOSTAR) 100 unit/mL (3 mL) pen   No No   Sig: 25 Units by SubCUTAneous route nightly.   insulin glulisine (APIDRA SOLOSTAR) 100 unit/mL pen   Yes No   Sig: 5 Units by SubCUTAneous route Before breakfast, lunch, and dinner. Indications: TYPE 1 DIABETES MELLITUS, pt states this is her sliding scale insulin   lisinopril (PRINIVIL, ZESTRIL) 20 mg tablet   Yes No   Sig: Take  by mouth daily.   ondansetron (ZOFRAN ODT) 4 mg disintegrating tablet   No No   Sig: Take 1 Tab by mouth every eight (8) hours as needed for Nausea.      Facility-Administered Medications: None     Allergies     Allergies   Allergen  Reactions   ??? Robaxin [Methocarbamol] Other (comments)     Body shaking, nausea   ??? Penicillins Hives   ??? Zithromax [Azithromycin] Hives     Physical Exam     Physical Exam  Vitals:   ED Triage Vitals   Enc Vitals Group      BP 02/08/15 1343 180/88      Pulse (Heart Rate) 02/08/15 1343 85      Resp Rate 02/08/15 1343 16      Temp 02/08/15 1343 98.6 ??F (37 ??C)      Temp src --       O2 Sat (%) 02/08/15 1343 100 %     Constitutional: Patient appears well developed and well nourished. She is up ambulating around the room and spitting into an emesis bag. Appearance and behavior are age and situation appropriate.  HEENT: Conjunctiva clear.  PERRLA. Mucous membranes somewhat moist, non-erythematous.    Respiratory: lungs clear to auscultation, nonlabored respirations. No tachypnea or accessory muscle use.  Cardiovascular: heart regular rate and rhythm without murmur rubs or gallops.    Calves soft and non-tender. Distal pulses 2+ and equal bilaterally.  No peripheral edema or significant variscosities.    Gastrointestinal:  Normoactive bowel sounds. Abdomen is soft. There is mild pain to palpation in the epigastric left upper quadrant region. No rebound or guarding is appreciated.  Musculoskeletal: Nail beds pink with prompt capillary refill, no flank pain. Moving all extremities spontaneously.  Integumentary: warm and dry without rashes or lesions  Neurologic: alert and oriented, Sensation intact, motor strength equal and symmetric.      Impression and Management Plan   30 year old female with gastroparesis here with worsening vomiting after upper endoscopy earlier today. We will treat symptomatically and and check an i-STAT due to history of diabetes.  Diagnostic Studies   Lab:   Recent Results (from the past 12 hour(s))   POC CHEM8    Collection Time: 02/08/15  2:25 PM   Result Value Ref Range    Sodium 136 136 - 145 mEq/L    Potassium 3.7 3.5 - 4.9 mEq/L    Chloride 102 98 - 107 mEq/L    CO2, TOTAL 21 21 - 32 mmol/L    Glucose 312 (H) 74 - 106 mg/dL    BUN 10 7 - 25 mg/dl    Creatinine 0.6 0.6 - 1.3 mg/dl    HCT 38 38 - 45 %    HGB 12.9 (L) 13.0 - 17.2 gm/dl    CALCIUM,IONIZED 1.61 4.40 - 5.40 mg/dL   LIPASE    Collection Time: 02/08/15  2:42 PM   Result Value Ref Range    Lipase 120 73 - 393 U/L     Imaging:    Xr Abd Acute W 1 V Chest    Result Date: 02/08/2015  CLINICAL HISTORY: abdominal pain after EGD    PROCEDURE: XR ABD ACUTE W 1 V CHEST COMPARISON: 11/29/2014. FINDINGS: Moderate amount of colonic stool. No evidence of bowel obstruction. No free air. The heart size is normal in the lung fields are clear.     IMPRESSION: Constipation.       Procedures    ED Course   Shin stable while here. She was medicated for nausea and vomiting with IV Benadryl, IV Reglan, IV Ativan. After medications given she was resting comfortably and feeling better. Laboratory studies were reviewed. Because  she had had a recent endoscopy today acute abdominal series was done to assure that there  was no free air and was negative. A lipase was also ordered due to concern for procedure causing worsening pain. This was also noted to be negative. Again, patient is currently resting comfortably her abdomen is benign. I think that she is stable to go home. She states that she has plenty of anti-emedics at home that are prescribed to her. She is advised to follow-up with her doctor or Dr. Federico FlakeWaldholtz as an outpatient for further value should management. She of course to return to the ED for new or worsening symptoms.  Medical Decision Making   As above  Final Diagnosis       ICD-10-CM ICD-9-CM   1. Acute vomiting R11.10 787.03     Disposition   Home. Instructions as above.    Discharge Medication List as of 02/08/2015  3:32 PM      CONTINUE these medications which have NOT CHANGED    Details   METOCLOPRAMIDE HCL (REGLAN PO) Take 5 mL by mouth daily., Historical Med      insulin glargine (LANTUS SOLOSTAR) 100 unit/mL (3 mL) pen 25 Units by SubCUTAneous route nightly., Print, Disp-1 Each, R-2      ondansetron (ZOFRAN ODT) 4 mg disintegrating tablet Take 1 Tab by mouth every eight (8) hours as needed for Nausea., Print, Disp-12 Tab, R-0      lisinopril (PRINIVIL, ZESTRIL) 20 mg tablet Take  by mouth daily., Historical Med      insulin glulisine (APIDRA SOLOSTAR) 100 unit/mL pen 5 Units by SubCUTAneous route Before breakfast, lunch, and dinner. Indications: TYPE 1 DIABETES MELLITUS, pt states this is her sliding scale insulin, Historical Med             The patient was personally evaluated by myself and Dr. Delton SeeNelson who agrees with the above assessment and plan    Dragon medical dictation software was used for portions of this report. Unintended errors may occur.     Limmie PatriciaBrittany R Jerremy Maione, PA-C  February 08, 2015    My signature above authenticates this document and my orders, the final ??   diagnosis (es), discharge prescription (s), and instructions in the Epic ??  record.  If you have any questions please contact [phone].  ??  Nursing notes have been reviewed by the physician/ advanced practice ??  Clinician.

## 2015-02-08 NOTE — Other (Signed)
Dr waldholtz at bedside

## 2015-02-08 NOTE — Other (Signed)
Dr Jackson Latinopecsok made aware of pt bp readings, no new orders recieved

## 2015-02-11 MED FILL — PROPOFOL 10 MG/ML IV EMUL: 10 mg/mL | INTRAVENOUS | Qty: 50.05

## 2015-02-11 MED FILL — GLYCOPYRROLATE 0.2 MG/ML IJ SOLN: 0.2 mg/mL | INTRAMUSCULAR | Qty: 0.2

## 2015-02-11 MED FILL — SODIUM CHLORIDE 0.9 % IV: INTRAVENOUS | Qty: 700

## 2015-02-11 MED FILL — KETAMINE 50 MG/ML (1 ML) INTRAVENOUS SYRINGE: 50 mg/mL (1 mL) | INTRAVENOUS | Qty: 10

## 2015-02-11 MED FILL — LIDOCAINE 4 % TOPICAL SOLN: 4 % (0 mg/mL) | Qty: 3

## 2015-02-11 MED FILL — ONDANSETRON (PF) 4 MG/2 ML INJECTION: 4 mg/2 mL | INTRAMUSCULAR | Qty: 4

## 2015-02-11 MED FILL — PROPOFOL 10 MG/ML IV EMUL: 10 mg/mL | INTRAVENOUS | Qty: 50

## 2015-02-15 NOTE — Telephone Encounter (Signed)
Patient has no PCP and no insurance. Patient visited ED on 02/08/15; patient has had 21 ED visits in past year. Life Coach attempted to reach patient via phone regarding need for PCP follow up. LC left VM with contact information and request to call back. [facility] mailed patient letter including the following information: [facility], Rx savings card, Affordable Diabetes Med List, [facility] and [facility], [facility], Financial Assistance Letter, ACA enrollment, and BS CAV information. LC looks forward to working with this patient.

## 2015-03-21 NOTE — Telephone Encounter (Signed)
Life Coach 2nd attempt to reach patient via phone regarding need for PCP follow up. LC left VM with contact information and request to call back. LC already mailed patient materials.

## 2015-04-02 ENCOUNTER — Encounter

## 2015-04-11 ENCOUNTER — Encounter

## 2015-04-11 ENCOUNTER — Inpatient Hospital Stay: Payer: MEDICAID | Attending: Gastroenterology

## 2015-04-11 ENCOUNTER — Inpatient Hospital Stay
Admit: 2015-04-11 | Discharge: 2015-04-11 | Disposition: A | Discharge status: Home / Self Care | Payer: MEDICAID | Attending: Emergency Medicine

## 2015-04-11 ENCOUNTER — Emergency Department: Admit: 2015-04-11 | Payer: MEDICAID

## 2015-04-11 ENCOUNTER — Inpatient Hospital Stay: Admit: 2015-04-11 | Payer: MEDICAID | Attending: Gastroenterology

## 2015-04-11 DIAGNOSIS — K5900 Constipation, unspecified: Secondary | ICD-10-CM

## 2015-04-11 DIAGNOSIS — R1013 Epigastric pain: Secondary | ICD-10-CM

## 2015-04-11 LAB — POC CHEM8
BUN: 3 mg/dl — ABNORMAL LOW (ref 7–25)
CALCIUM,IONIZED: 4.5 mg/dL (ref 4.40–5.40)
CO2, TOTAL: 26 mmol/L (ref 21–32)
Chloride: 97 mEq/L — ABNORMAL LOW (ref 98–107)
Creatinine: 0.7 mg/dl (ref 0.6–1.3)
Glucose: 247 mg/dL — ABNORMAL HIGH (ref 74–106)
HCT: 39 % (ref 38–45)
HGB: 13.3 gm/dl (ref 13.0–17.2)
Potassium: 3.9 mEq/L (ref 3.5–4.9)
Sodium: 136 mEq/L (ref 136–145)

## 2015-04-11 LAB — POC URINE MACROSCOPIC
Bilirubin: NEGATIVE
Blood: NEGATIVE
Glucose: 500 mg/dl — AB
Ketone: 15 mg/dl — AB
Nitrites: NEGATIVE
Protein: 100 mg/dl — AB
Specific gravity: 1.02 (ref 1.005–1.030)
Urobilinogen: 1 EU/dl (ref 0.0–1.0)
pH (UA): 7.5 (ref 5–9)

## 2015-04-11 LAB — POC URINE MICROSCOPIC

## 2015-04-11 LAB — POC HCG,URINE: HCG urine, QL: NEGATIVE

## 2015-04-11 MED ORDER — BARIUM SULFATE 96 % (W/W) ORAL SUSP, RECON
96 % (w/w) | Freq: Once | ORAL | Status: AC
Start: 2015-04-11 — End: 2015-04-11
  Administered 2015-04-11: 13:00:00 via ORAL

## 2015-04-11 MED ORDER — METOCLOPRAMIDE 5 MG/ML IJ SOLN
5 mg/mL | Freq: Once | INTRAMUSCULAR | Status: AC
Start: 2015-04-11 — End: 2015-04-11
  Administered 2015-04-11: 17:00:00 via INTRAVENOUS

## 2015-04-11 MED ORDER — SOD BICARB-CITRIC AC-SIMETH 2.21 GRAM-1.53 GRAM/4 GRAM ORAL GRAN IN PK
Freq: Once | ORAL | Status: AC
Start: 2015-04-11 — End: 2015-04-11
  Administered 2015-04-11: 13:00:00 via ORAL

## 2015-04-11 MED ORDER — SODIUM CHLORIDE 0.9% BOLUS IV
0.9 % | INTRAVENOUS | Status: AC
Start: 2015-04-11 — End: 2015-04-11
  Administered 2015-04-11: 17:00:00 via INTRAVENOUS

## 2015-04-11 MED ORDER — BARIUM SULFATE 98 % ORAL SUSP, RECON
98 % | Freq: Once | ORAL | Status: AC
Start: 2015-04-11 — End: 2015-04-11
  Administered 2015-04-11: 13:00:00 via ORAL

## 2015-04-11 MED ORDER — DIPHENHYDRAMINE HCL 50 MG/ML IJ SOLN
50 mg/mL | Freq: Once | INTRAMUSCULAR | Status: AC
Start: 2015-04-11 — End: 2015-04-11
  Administered 2015-04-11: 17:00:00 via INTRAVENOUS

## 2015-04-11 MED ORDER — SODIUM CHLORIDE 0.9 % IV
5 mg/mL | Freq: Once | INTRAVENOUS | Status: DC
Start: 2015-04-11 — End: 2015-04-11

## 2015-04-11 MED FILL — E-Z-PAQUE 96 % (W/W) ORAL POWDER FOR SUSPENSION: 96 % (w/w) | ORAL | Qty: 176

## 2015-04-11 MED FILL — METOCLOPRAMIDE 5 MG/ML IJ SOLN: 5 mg/mL | INTRAMUSCULAR | Qty: 2

## 2015-04-11 MED FILL — DIPHENHYDRAMINE HCL 50 MG/ML IJ SOLN: 50 mg/mL | INTRAMUSCULAR | Qty: 1

## 2015-04-11 MED FILL — E-Z-HD BARIUM 98 % ORAL SUSPENSION: 98 % | ORAL | Qty: 135

## 2015-04-11 MED FILL — E-Z-GAS II 2.21 GRAM-1.53 GRAM/4 GRAM ORAL EFFERVESCENT GRANULES PACK: ORAL | Qty: 1

## 2015-04-11 NOTE — ED Triage Notes (Signed)
Pt c/o cough and body aches x 1 week and n/v today.

## 2015-04-11 NOTE — ED Provider Notes (Signed)
[facility]  Emergency Department Treatment Report        Patient: [name] [name] Age: 30 y.o. Sex: female    Date of Birth: [birth year] Admit Date: 04/11/2015 PCP: None   MRN: [id number]  CSN: [id number]     Room: ER44/ER44 Time Dictated: 12:52 PM        Chief Complaint   Vomiting and abdominal pain    History of Present Illness   30 y.o. female with long history of gastroparesis states she just had a barium swallow this morning she began having vomiting and diffuse abdominal pain right after that. No hematemesis, coffee-ground emesis, chest pain, shortness of breath; does have a significant cough. No hemoptysis or calf symptoms, diarrhea, melena, hematochezia, urinary symptoms, or any other symptoms or injuries.    Review of Systems   ROS  Constitutional: No fever, chills, or weight loss  Eyes: No visual symptoms.  ENT: No sore throat, runny nose or ear pain.  Respiratory: No dyspnea or wheezing.  Cardiovascular: No chest pain, pressure, palpitations, tightness or heaviness.  Gastrointestinal: No diarrhea.  Genitourinary: No dysuria, frequency, or urgency.  Musculoskeletal: No joint pain or swelling.  Integumentary: No rashes.  Neurological: No headaches, sensory or motor symptoms.  Denies complaints in all other systems.    Past Medical/Surgical History     Past Medical History:   Diagnosis Date   ??? Diabetes (HCC)    ??? Gastroparesis    ??? Hypertension      Past Surgical History:   Procedure Laterality Date   ??? HX CESAREAN SECTION         Social History     Social History     Social History   ??? Marital status: SINGLE     Spouse name: N/A   ??? Number of children: N/A   ??? Years of education: N/A     Occupational History   ??? Not on file.     Social History Main Topics   ??? Smoking status: Never Smoker   ??? Smokeless tobacco: Not on file   ??? Alcohol use No   ??? Drug use: No   ??? Sexual activity: Yes     Birth control/ protection: Injection     Other Topics Concern   ??? Not on file      Social History Narrative       Family History   History reviewed. No pertinent family history.    Current Medications     Prior to Admission Medications   Prescriptions Last Dose Informant Patient Reported? Taking?   METOCLOPRAMIDE HCL (REGLAN PO)   Yes No   Sig: Take 5 mL by mouth daily.   insulin glargine (LANTUS SOLOSTAR) 100 unit/mL (3 mL) pen   No No   Sig: 25 Units by SubCUTAneous route nightly.   insulin glulisine (APIDRA SOLOSTAR) 100 unit/mL pen   Yes No   Sig: 5 Units by SubCUTAneous route Before breakfast, lunch, and dinner. Indications: TYPE 1 DIABETES MELLITUS, pt states this is her sliding scale insulin   lisinopril (PRINIVIL, ZESTRIL) 20 mg tablet   Yes No   Sig: Take  by mouth daily.   ondansetron (ZOFRAN ODT) 4 mg disintegrating tablet   No No   Sig: Take 1 Tab by mouth every eight (8) hours as needed for Nausea.      Facility-Administered Medications: None       Allergies     Allergies   Allergen Reactions   ??? Robaxin [  Methocarbamol] Other (comments)     Body shaking, nausea   ??? Penicillins Hives   ??? Zithromax [Azithromycin] Hives       Physical Exam   ED Triage Vitals   Enc Vitals Group      BP 04/11/15 1141 183/109      Pulse (Heart Rate) 04/11/15 1141 107      Resp Rate 04/11/15 1141 16      Temp 04/11/15 1141 98.6 ??F (37 ??C)      Temp src --       O2 Sat (%) 04/11/15 1141 99 %      Weight 04/11/15 1133 120 lb      Height 04/11/15 1133 5\' 6"       Head Cir --       Peak Flow --       Pain Score --       Pain Loc --       Pain Edu? --       Excl. in [place]? --      Physical Exam   Constitutional: She is oriented to person, place, and time.   The patient looks in distress from her pain and nausea, no respiratory distress.   HENT:   Head: Normocephalic.   Right Ear: External ear normal.   Left Ear: External ear normal.   Nose: Nose normal.   Mouth/Throat: Oropharynx is clear and moist.   Eyes: EOM are normal. Pupils are equal, round, and reactive to light. No scleral icterus.    Cardiovascular: Normal rate, regular rhythm and normal heart sounds.    Pulmonary/Chest: Effort normal and breath sounds normal.   Abdominal: Soft.   Abdomen is soft, mildly tender left lower quadrant less than midepigastrium, VS normal without rebound, masses, distention or CVAT.   Musculoskeletal: Normal range of motion.   Neurological: She is alert and oriented to person, place, and time.   Skin: Skin is warm and dry. She is not diaphoretic.   Psychiatric: Affect and judgment normal.        Impression and Management Plan   Patient will need a chest x-ray because of her cough, as well as some meds for her vomiting and abdominal pain.    Diagnostic Studies   Lab:   Recent Results (from the past 12 hour(s))   POC URINE MACROSCOPIC    Collection Time: 04/11/15 12:30 PM   Result Value Ref Range    Glucose 500 (A) NEGATIVE,Negative mg/dl    Bilirubin Negative NEGATIVE,Negative      Ketone 15 (A) NEGATIVE,Negative mg/dl    Specific gravity 9.1471.020 1.005 - 1.030      Blood Negative NEGATIVE,Negative      pH (UA) 7.5 5 - 9      Protein 100 (A) NEGATIVE,Negative mg/dl    Urobilinogen 1.0 0.0 - 1.0 EU/dl    Nitrites Negative NEGATIVE,Negative      Leukocyte Esterase Small (A) NEGATIVE,Negative      Color Yellow      Appearance Clear     POC CHEM8    Collection Time: 04/11/15 12:32 PM   Result Value Ref Range    Sodium 136 136 - 145 mEq/L    Potassium 3.9 3.5 - 4.9 mEq/L    Chloride 97 (L) 98 - 107 mEq/L    CO2, TOTAL 26 21 - 32 mmol/L    Glucose 247 (H) 74 - 106 mg/dL    BUN 3 (L) 7 - 25 mg/dl    Creatinine 0.7  0.6 - 1.3 mg/dl    HCT 39 38 - 45 %    HGB 13.3 13.0 - 17.2 gm/dl    CALCIUM,IONIZED 0.45 4.40 - 5.40 mg/dL   POC HCG,URINE    Collection Time: 04/11/15 12:33 PM   Result Value Ref Range    HCG urine, Ql. negative NEGATIVE,Negative,negative       Labs Reviewed   POC CHEM8 - Abnormal; Notable for the following:        Result Value    Chloride 97 (*)     Glucose 247 (*)     BUN 3 (*)      All other components within normal limits   POC URINE MACROSCOPIC - Abnormal; Notable for the following:     Glucose 500 (*)     Ketone 15 (*)     Protein 100 (*)     Leukocyte Esterase Small (*)     All other components within normal limits   POC HCG,URINE   POC URINE DIPSTICK   POC URINE PREGNANCY TEST   POC CHEM8   POC URINE MICROSCOPIC       ED Course   The patient had an IV established, was given a liter saline, Reglan 10 mg Benadryl 25 mg IV. She is feeling much better on recheck at 1410, will be discharged to follow-up with Dr. Nolon Nations as already scheduled.    Final Diagnosis       ICD-10-CM ICD-9-CM   1. Non-intractable vomiting with nausea, unspecified vomiting type R11.2 787.01   2. Abdominal pain, epigastric R10.13 789.06         Disposition   The patient is discharged home in stable condition, with instructions to follow-up with her GI doctor.  They are advised to return immediately for any worsening or symptoms of concern.    Rolena Infante, MD  April 11, 2015    My signature above authenticates this document and my orders, the final ??  diagnosis (es), discharge prescription (s), and instructions in the Epic ??  record.  If you have any questions please contact [phone].  ??  Nursing notes have been reviewed by the physician/ advanced practice ??  Clinician.    Dragon medical dictation software was used for portions of this report. Unintended transcription errors may occur.

## 2015-04-17 ENCOUNTER — Inpatient Hospital Stay: Admit: 2015-04-17 | Payer: MEDICAID | Attending: Gastroenterology

## 2015-04-17 ENCOUNTER — Encounter

## 2015-04-17 DIAGNOSIS — K5900 Constipation, unspecified: Secondary | ICD-10-CM

## 2015-04-17 MED ORDER — TECHNETIUM TC 99M SULFUR COLLOID
Freq: Once | Status: AC
Start: 2015-04-17 — End: 2015-04-17
  Administered 2015-04-17: 11:00:00 via ORAL

## 2015-04-17 MED FILL — TECHNETIUM TC 99M SULFUR COLLOID: Qty: 1

## 2015-04-20 ENCOUNTER — Inpatient Hospital Stay: Admit: 2015-04-20 | Payer: MEDICAID | Attending: Gastroenterology

## 2015-04-20 DIAGNOSIS — K5900 Constipation, unspecified: Secondary | ICD-10-CM

## 2015-04-20 MED ORDER — TECHNETIUM TC 99M SULFUR COLLOID
Freq: Once | Status: AC
Start: 2015-04-20 — End: 2015-04-20
  Administered 2015-04-20: 11:00:00 via ORAL

## 2015-04-20 MED FILL — TECHNETIUM TC 99M SULFUR COLLOID: Qty: 1

## 2015-04-23 ENCOUNTER — Inpatient Hospital Stay: Admit: 2015-04-23 | Discharge: 2015-04-24 | Payer: MEDICAID | Attending: Emergency Medicine

## 2015-04-23 DIAGNOSIS — Z5321 Procedure and treatment not carried out due to patient leaving prior to being seen by health care provider: Secondary | ICD-10-CM

## 2015-04-23 NOTE — ED Notes (Signed)
Ahmed Primaolleen O'Leary, PA to room for assessment and pt still not in room and staff does not know location of pt.

## 2015-04-23 NOTE — ED Notes (Signed)
Pt's mother came to door and yelled out "she's having a seizure".  When staff entered room, no seizure activity noted, pt made eye contact with staff and stated "my stomach hurts right here" pointing to LLQ.  This nurse explained to mother that if pt was having a seizure, she would not be able to communicate with staff and that the pts "episodes" could be related to pain.

## 2015-04-23 NOTE — ED Notes (Signed)
Pt noted laying on stretcher with eyes closed and no acute distress noted.

## 2015-04-23 NOTE — ED Notes (Signed)
Pt not in room and location unknown.  Will mark pt as eloped.

## 2015-04-23 NOTE — ED Notes (Signed)
Pt not in room at this time.  Pt was seen with mother walking out of room down hallway.  Unable to obtain vital signs.

## 2015-04-23 NOTE — ED Triage Notes (Addendum)
Pt presents via EMS for c/o abd pain.  Pt was d/c from [facility] approx 2 hours ago for same c/o and per Care Everywhere, workup was negative, pt received IV Compazine and Benedryl.  Per EMS report, family states "she goes into convulsions from the pain", which were unwitnessed by EMS.       Per [facility] Note:    HPI Comments: [name] [name] is a 30 y.o. female with a hx of IDDM and gastroparesis who presents to the ED for her 10th visit within the past month for nausea, vomiting and abdominal pain. Pt is followed by Dr. Nolon NationsYagel, GI, who recently discontinued her Reglan. Each workup in the ED has been consistent with diabetic gastroparesis and pt ha been discharged home each time. Additionally, she has had 2 CT scans of the abdomen in the past 4 months. Her only surgical hx is c-section.

## 2015-06-01 NOTE — Telephone Encounter (Signed)
Life Coach attempted to reach patient again via phone regarding need for PCP follow up. No VM option on patient's phone; LC unable to contact after 4 attempts.

## 2015-06-14 ENCOUNTER — Inpatient Hospital Stay
Admit: 2015-06-14 | Discharge: 2015-06-15 | Disposition: A | Discharge status: Other Acute Inpatient Hospital | Payer: MEDICAID | Attending: Emergency Medicine

## 2015-06-14 ENCOUNTER — Emergency Department: Admit: 2015-06-14 | Payer: MEDICAID

## 2015-06-14 DIAGNOSIS — R1084 Generalized abdominal pain: Secondary | ICD-10-CM

## 2015-06-14 LAB — POC URINE MACROSCOPIC
Bilirubin: NEGATIVE
Blood: NEGATIVE
Glucose: 500 mg/dl — AB
Ketone: 15 mg/dl — AB
Leukocyte Esterase: NEGATIVE
Nitrites: NEGATIVE
Protein: 100 mg/dl — AB
Specific gravity: 1.02 (ref 1.005–1.030)
Urobilinogen: 0.2 EU/dl (ref 0.0–1.0)
pH (UA): 7 (ref 5–9)

## 2015-06-14 LAB — CBC WITH AUTOMATED DIFF
BASOPHILS: 0.4 % (ref 0–3)
EOSINOPHILS: 0.2 % (ref 0–5)
HCT: 34.5 % — ABNORMAL LOW (ref 37.0–50.0)
HGB: 11.5 gm/dl — ABNORMAL LOW (ref 13.0–17.2)
IMMATURE GRANULOCYTES: 0.3 % (ref 0.0–3.0)
LYMPHOCYTES: 15.9 % — ABNORMAL LOW (ref 28–48)
MCH: 27.3 pg (ref 25.4–34.6)
MCHC: 33.3 gm/dl (ref 30.0–36.0)
MCV: 81.8 fL (ref 80.0–98.0)
MONOCYTES: 3.5 % (ref 1–13)
MPV: 11.6 fL — ABNORMAL HIGH (ref 6.0–10.0)
NEUTROPHILS: 79.7 % — ABNORMAL HIGH (ref 34–64)
NRBC: 0 (ref 0–0)
PLATELET: 351 10*3/uL (ref 140–450)
RBC: 4.22 M/uL (ref 3.60–5.20)
RDW-SD: 39 (ref 36.4–46.3)
WBC: 10.7 10*3/uL (ref 4.0–11.0)

## 2015-06-14 LAB — METABOLIC PANEL, COMPREHENSIVE
ALT (SGPT): 12 U/L (ref 12–78)
AST (SGOT): 11 U/L — ABNORMAL LOW (ref 15–37)
Albumin: 3.5 gm/dl (ref 3.4–5.0)
Alk. phosphatase: 112 U/L (ref 45–117)
BUN: 14 mg/dl (ref 7–25)
Bilirubin, total: 0.4 mg/dl (ref 0.2–1.0)
CO2: 26 mEq/L (ref 21–32)
Calcium: 9.2 mg/dl (ref 8.5–10.1)
Chloride: 100 mEq/L (ref 98–107)
Creatinine: 1 mg/dl (ref 0.6–1.3)
GFR est AA: >60
GFR est non-AA: >60
Glucose: 289 mg/dl — ABNORMAL HIGH (ref 74–106)
Potassium: 3.8 mEq/L (ref 3.5–5.1)
Protein, total: 8.4 gm/dl — ABNORMAL HIGH (ref 6.4–8.2)
Sodium: 135 mEq/L — ABNORMAL LOW (ref 136–145)

## 2015-06-14 LAB — POC HCG,URINE: HCG urine, QL: NEGATIVE

## 2015-06-14 LAB — LIPASE: Lipase: 68 U/L — ABNORMAL LOW (ref 73–393)

## 2015-06-14 MED ORDER — SODIUM CHLORIDE 0.9 % IV
Freq: Once | INTRAVENOUS | Status: AC
Start: 2015-06-14 — End: 2015-06-14
  Administered 2015-06-14: 21:00:00 via INTRAVENOUS

## 2015-06-14 MED ORDER — SODIUM CHLORIDE 0.9% BOLUS IV
0.9 % | Freq: Once | INTRAVENOUS | Status: AC
Start: 2015-06-14 — End: 2015-06-14
  Administered 2015-06-14: 18:00:00 via INTRAVENOUS

## 2015-06-14 MED ORDER — LORAZEPAM 2 MG/ML IJ SOLN
2 mg/mL | Freq: Once | INTRAMUSCULAR | Status: AC
Start: 2015-06-14 — End: 2015-06-14
  Administered 2015-06-14: 18:00:00 via INTRAVENOUS

## 2015-06-14 MED ORDER — METOCLOPRAMIDE 5 MG/ML IJ SOLN
5 mg/mL | Freq: Four times a day (QID) | INTRAMUSCULAR | Status: DC
Start: 2015-06-14 — End: 2015-06-15
  Administered 2015-06-14: 18:00:00 via INTRAVENOUS

## 2015-06-14 MED ORDER — DIPHENHYDRAMINE HCL 50 MG/ML IJ SOLN
50 mg/mL | INTRAMUSCULAR | Status: AC
Start: 2015-06-14 — End: 2015-06-14
  Administered 2015-06-14: 18:00:00 via INTRAVENOUS

## 2015-06-14 MED ORDER — IOPAMIDOL 76 % IV SOLN
370 mg iodine /mL (76 %) | Freq: Once | INTRAVENOUS | Status: AC
Start: 2015-06-14 — End: 2015-06-14
  Administered 2015-06-14: 19:00:00 via INTRAVENOUS

## 2015-06-14 MED FILL — ISOVUE-370  76 % INTRAVENOUS SOLUTION: 370 mg iodine /mL (76 %) | INTRAVENOUS | Qty: 85

## 2015-06-14 MED FILL — LORAZEPAM 2 MG/ML IJ SOLN: 2 mg/mL | INTRAMUSCULAR | Qty: 1

## 2015-06-14 MED FILL — DIPHENHYDRAMINE HCL 50 MG/ML IJ SOLN: 50 mg/mL | INTRAMUSCULAR | Qty: 1

## 2015-06-14 MED FILL — METOCLOPRAMIDE 5 MG/ML IJ SOLN: 5 mg/mL | INTRAMUSCULAR | Qty: 2

## 2015-06-14 NOTE — ED Notes (Signed)
Report given to Lindsay RN.

## 2015-06-14 NOTE — ED Provider Notes (Addendum)
[facility]  Emergency Department Treatment Report        Patient: [name] [name] Age: 30 y.o. Sex: female    Date of Birth: 02-27-1985 Admit Date: 06/14/2015 PCP: None   MRN: [id number]  CSN: [id number]     Room: ER43/ER43 Time Dictated: 3:28 PM        I hereby certify this patient for admission based upon medical necessity as ??  noted below:    Chief Complaint   Chief Complaint   Patient presents with   ??? Abdominal Pain       History of Present Illness     This is a 30 y.o. female with a history of diabetic gastroparesis was admitted to [facility] days ago for surgical placement of intra-abdominal gastric electrical stimulation system, states that she was "doing well" yesterday, but last night started vomiting, and abdominal pain has rapidly increased. Presents for further evaluation as she is postoperative and cannot take her pain medicines due to intractable vomiting. Patient denies other complaints.    Patient passing flatus but no stool since surgery.      Review of Systems     Constitutional:  No fever or chills.  Eyes: No visual symptoms.  ENT: No sore throat, runny nose or ear pain.  Respiratory: No cough, dyspnea or wheezing.  Cardoivascular: No chest pain, pressure, palpitations, tightness or heaviness. No postoperative pleurisy.  Gastrointestinal: As above.  Genitourinary: No dysuria, frequency, or urgency.  Musculoskeletal: No joint pain or swelling.  Integumentary: No rashes.  Neurological: No headaches, sensory or motor symptoms.    Past Medical/Surgical History     Past Medical History:   Diagnosis Date   ??? Diabetes ([place])    ??? Drug-seeking behavior    ??? Gastroparesis    ??? Hypertension      Past Surgical History:   Procedure Laterality Date   ??? ABDOMEN SURGERY PROC UNLISTED  05/2015    Gastric stimulator pump    ??? HX CESAREAN SECTION         Social History     Social History     Social History   ??? Marital status: SINGLE     Spouse name: N/A   ??? Number of children: N/A    ??? Years of education: N/A     Occupational History   ??? Not on file.     Social History Main Topics   ??? Smoking status: Never Smoker   ??? Smokeless tobacco: Not on file   ??? Alcohol use No   ??? Drug use: No   ??? Sexual activity: Yes     Birth control/ protection: Injection     Other Topics Concern   ??? Not on file     Social History Narrative       Family History   History reviewed. No pertinent family history.    Current Medications     Prior to Admission Medications   Prescriptions Last Dose Informant Patient Reported? Taking?   METOCLOPRAMIDE HCL (REGLAN PO)   Yes No   Sig: Take 5 mL by mouth daily.   carvedilol (COREG) 12.5 mg tablet   Yes Yes   Sig: Take 12.5 mg by mouth daily.   dicyclomine (BENTYL) 20 mg tablet   Yes No   Sig: Take 20 mg by mouth every six (6) hours.   insulin glargine (LANTUS SOLOSTAR) 100 unit/mL (3 mL) pen   No Yes   Sig: 25 Units by SubCUTAneous route nightly.  Patient taking differently: 24 Units by SubCUTAneous route nightly.   insulin glulisine (APIDRA SOLOSTAR) 100 unit/mL pen   Yes Yes   Sig: by SubCUTAneous route Before breakfast, lunch, and dinner. Indications: type 1 diabetes mellitus, pt states this is her sliding scale insulin   lisinopril (PRINIVIL, ZESTRIL) 20 mg tablet   Yes Yes   Sig: Take 20 mg by mouth two (2) times a day.   ondansetron (ZOFRAN ODT) 4 mg disintegrating tablet   No No   Sig: Take 1 Tab by mouth every eight (8) hours as needed for Nausea.   prochlorperazine (COMPAZINE) 10 mg tablet   Yes No   Sig: Take 10 mg by mouth every six (6) hours as needed.   promethazine (PHENERGAN) 25 mg tablet   Yes No   Sig: Take 25 mg by mouth every six (6) hours as needed for Nausea.   traMADol (ULTRAM) 50 mg tablet   Yes No   Sig: Take 50 mg by mouth every six (6) hours as needed for Pain. Indications: Pain      Facility-Administered Medications: None       Allergies     Allergies   Allergen Reactions   ??? Morphine Hives   ??? Robaxin [Methocarbamol] Other (comments)      Body shaking, nausea   ??? Penicillins Hives   ??? Zithromax [Azithromycin] Hives       Physical Exam   ED Triage Vitals   Enc Vitals Group      BP 06/14/15 1231 196/136      Pulse (Heart Rate) 06/14/15 1231 111      Resp Rate 06/14/15 1231 22      Temp 06/14/15 1231 98.4 ??F (36.9 ??C)      Temp src --       O2 Sat (%) 06/14/15 1231 100 %      Weight 06/14/15 1231 121 lb      Height 06/14/15 1231 '5\' 6"'$       Head Cir --       Peak Flow --       Pain Score --       Pain Loc --       Pain Edu? --       Excl. in [place]? --      Constitutional: Patient appears well developed and well nourished. Appearance and behavior are age and situation appropriate.  Eyes: Conjutivae clear, lids normal. Pupils equal, symmetrical, and normally reactive.  HEENT: Ears/Nose: Hearing is grossly intact to voice. Internal and external examinations of the ears and nose are unremarkable. Conjunctiva clear.  PERRLA. Mucous membranes moist, non-erythematous. Surface of the pharynx, palate, and tongue are pink, moist and without lesions.  Neck: supple, non tender, symmetrical.  Respiratory: lungs clear to auscultation, nonlabored respirations. No tachypnea or accessory muscle use.  Cardiovascular: heart regular rate and rhythm without murmur rubs or gallops.   Calves soft and non-tender. Distal pulses 2+ and equal bilaterally.  No peripheral edema or significant variscosities.    Gastrointestinal: Diffuse abdominal tenderness with exception of right lower quadrant, patient pushes my hand away and asked me not to examine her abdomen until explained that I must do this to care for her. Despite appearing calm before I enter the room, is in the room is tearful crying and writhing. Abdominal incisions appear well but in early stages of healing, coated with tissue adhesive.  Musculoskeletal: Nail beds pink with prompt capillary refill  Integumentary: warm and dry without rashes  or lesions   Neurologic: alert and oriented, Sensation intact, motor strength equal and symmetric.  No facial asymmetry or dysarthria.       Impression and Management Plan     Acute abdominal pain, postoperative, we'll medicate symptomatically attending to avoid narcotics given gastroparesis history . We'll obtain screening studies for infectious or metabolic abnormalities and CT to evaluate for postoperative complications such as infection etc.     Procedures      Diagnostic Studies   LAB/IMAGING:   Results for orders placed or performed during the hospital encounter of 06/14/15   CT ABD PELV W CONT    Narrative    TECHNIQUE:   CT of the abdomen and pelvis WITH intravenous contrast.  The abdomen and pelvis  were scanned utilizing a multidetector helical scanner from the diaphragm to the  lesser trochanter without the oral administration of barium.  Coronal and  sagittal reformations were obtained.      COMPARISON: CT abdomen pelvis 11/29/2014    INDICATION: Nausea post gastric surgery, generalized abdominal pain, diabetic      DISCUSSION:          LOWER THORAX: Normal.    HEPATOBILIARY: No focal hepatic lesions.  Gallbladder unremarkable. No biliary  ductal dilatation.  SPLEEN: No splenomegaly or focal lesion.  PANCREAS: Generalized pancreatic atrophy without ductal dilatation.    ADRENALS: No adrenal nodules.  KIDNEYS/URETERS: No hydronephrosis, stones, or solid mass lesions.  PELVIC ORGANS/BLADDER: Unremarkable.    PERITONEUM / RETROPERITONEUM: Small amount free pelvic fluid measuring 18  Hounsfield units. Minimal free intraperitoneal air post electronic device  placement in left upper quadrant.  LYMPH NODES: No lymphadenopathy.  VESSELS: Unremarkable.    GI TRACT: No distention. Relatively large amount of fecal material in colon.  Poorly distended diffusely thick-walled descending colon, sigmoid, and rectum.  No evidence of appendicitis.    BONES AND SOFT TISSUES: Electronic device subcutaneous left lower quadrant   anterolateral abdomen..      Impression    IMPRESSION:    1. Free air and fluid post recent surgery as described. Relatively high density  to pelvic fluid could indicate subacute hemoperitoneum. No evidence of loculated  collection to imply abscess.  2. Pancreatic atrophy.  3. Question constipation.  4. Apparent wall thickening and poor distention distal colon and rectum.  Proctocolitis not excluded.     CBC WITH AUTOMATED DIFF   Result Value Ref Range    WBC 10.7 4.0 - 11.0 1000/mm3    RBC 4.22 3.60 - 5.20 M/uL    HGB 11.5 (L) 13.0 - 17.2 gm/dl    HCT 34.5 (L) 37.0 - 50.0 %    MCV 81.8 80.0 - 98.0 fL    MCH 27.3 25.4 - 34.6 pg    MCHC 33.3 30.0 - 36.0 gm/dl    PLATELET 351 140 - 450 1000/mm3    MPV 11.6 (H) 6.0 - 10.0 fL    RDW-SD 39.0 36.4 - 46.3      NRBC 0 0 - 0      IMMATURE GRANULOCYTES 0.3 0.0 - 3.0 %    NEUTROPHILS 79.7 (H) 34 - 64 %    LYMPHOCYTES 15.9 (L) 28 - 48 %    MONOCYTES 3.5 1 - 13 %    EOSINOPHILS 0.2 0 - 5 %    BASOPHILS 0.4 0 - 3 %   METABOLIC PANEL, COMPREHENSIVE   Result Value Ref Range    Sodium 135 (L) 136 - 145 mEq/L  Potassium 3.8 3.5 - 5.1 mEq/L    Chloride 100 98 - 107 mEq/L    CO2 26 21 - 32 mEq/L    Glucose 289 (H) 74 - 106 mg/dl    BUN 14 7 - 25 mg/dl    Creatinine 1.0 0.6 - 1.3 mg/dl    GFR est AA >60.0      GFR est non-AA >60      Calcium 9.2 8.5 - 10.1 mg/dl    AST (SGOT) 11 (L) 15 - 37 U/L    ALT (SGPT) 12 12 - 78 U/L    Alk. phosphatase 112 45 - 117 U/L    Bilirubin, total 0.4 0.2 - 1.0 mg/dl    Protein, total 8.4 (H) 6.4 - 8.2 gm/dl    Albumin 3.5 3.4 - 5.0 gm/dl   LIPASE   Result Value Ref Range    Lipase 68 (L) 73 - 393 U/L   POC URINE MACROSCOPIC   Result Value Ref Range    Glucose 500 (A) NEGATIVE,Negative mg/dl    Bilirubin Negative NEGATIVE,Negative      Ketone 15 (A) NEGATIVE,Negative mg/dl    Specific gravity 1.020 1.005 - 1.030      Blood Negative NEGATIVE,Negative      pH (UA) 7.0 5 - 9      Protein 100 (A) NEGATIVE,Negative mg/dl    Urobilinogen 0.2 0.0 - 1.0 EU/dl     Nitrites Negative NEGATIVE,Negative      Leukocyte Esterase Negative NEGATIVE,Negative      Color Yellow      Appearance Clear     POC HCG,URINE   Result Value Ref Range    HCG urine, Ql. negative NEGATIVE,Negative,negative     GLUCOSE, POC   Result Value Ref Range    Glucose (POC) 250 (H) 65 - 105 mg/dL         Medical Decision Making/ ED Course   Continued by Dr. Emeterio Reeve    I interviewed and examined the patient. I discussed with the mid-level provider agree with her evaluation and plan as documented here.    Patient was seen for postoperative pain and vomiting  Results of the CT were reviewed  She was medicated multiple times for her vomiting but had persistent nausea so will require admission for symptom control  Finding of possible hemoperitoneum was reviewed she is not hypotensive and she has a hemoglobin of 11.5    I called Dr. Venora Maples her surgeon on his cell phone the number is [phone] and he advised after reviewing the CT and diagnostics that he had a low suspicion that this was a hemoperitoneum and he felt that if we have were able to keep her here for symptom control that would be appropriate.  I did speak to Dr. Franchot Mimes the general surgeon who would not be comfortable being involved in the case because he does not perform the insertion of gastric stimulators and Dr. Franchot Mimes advised the patient should be transferred to the [facility] where her surgeons are.     We contacted the transfer center but apparently the hospitalist full so according to the day secretary Cecille Rubin they're not accepting any transfers.     I spoke as well to the on-call physician for Dr. Venora Maples at the hospital who is Dr. Carolynn Sayers at his cell phone which is [phone] he advises that if the transfer center advises the hospital is full we are unable to transfer the patient. He states that the patient could be kept here at  [place] and hospitalist could call in the morning to arrange for the transfer.      During the patient's stay in the ER the patient did not develop any new or worsening symptoms and   remained stable.  She had glucose rechecked. She is in agreement to the transfer.    10:04 PM transfer center advises they just came off diversion I spoke to Dr Jacky Kindle in the [facility] ED and he will accept the patient transfer to the ER. She has been stable for her prolonged ER stay. He states he will take care of contacting the GI surgeons on her arrival and I do not need to let them know that the patient will be transferred tonight not tomorrow.  I filled out the EMTALA form.      Medications given: 1L NS IV bolus, IV ativan, IV reglan, IV benadryl, 100cc/hour NS IV infusion    Final Diagnosis       ICD-10-CM ICD-9-CM   1. Vomiting, intractability of vomiting not specified, presence of nausea not specified, unspecified vomiting type R11.10 787.03   2. Abdominal pain, generalized R10.84 789.07       Disposition   The patient was personally evaluated by myself and  Dr. Emeterio Reeve who agrees with the above assessment and plan.    Dragon medical dictation software was used for portions of this report. Unintended errors may occur.     Porfirio Mylar, MPA, PA-C  Jun 14, 2015    My signature above authenticates this document and my orders, the final ??  diagnosis (es), discharge prescription (s), and instructions in the Epic ??  record.  If you have any questions please contact [phone].  ??  Nursing notes have been reviewed by the physician/ advanced practice ??  Clinician.

## 2015-06-14 NOTE — ED Notes (Signed)
MD notified patient requesting pain medication

## 2015-06-14 NOTE — ED Notes (Signed)
Patient requesting medication for pain. Blankets provided and fluids restarted

## 2015-06-14 NOTE — ED Notes (Signed)
Patient resting in stretcher with eyes closed. No needs at this time. Xray informed of image disc

## 2015-06-14 NOTE — ED Notes (Signed)
MD notified patient requesting medication for pain

## 2015-06-14 NOTE — ED Triage Notes (Signed)
Had surgery this passed Monday--  Had Gastric Electrical Stimulation System placed--  Has [employer] book with information about it with her  Pain has never stopped --  Can't take pain meds because she has NV

## 2015-06-14 NOTE — ED Notes (Signed)
Patient resting with eyes closed, does not appear to be in pain or distress.

## 2015-06-14 NOTE — ED Notes (Signed)
Patient up and ambulating back and forth in ED hallway.

## 2015-06-14 NOTE — ED Notes (Signed)
Patient reports she would like more medication for pain.

## 2015-06-14 NOTE — ED Notes (Signed)
Family in room with patient. Patient crying at this time and requesting medication for pain

## 2015-06-14 NOTE — ED Notes (Signed)
Report called [facility] ER to Capital OneDebbie Olson Charge RN.

## 2015-06-15 LAB — GLUCOSE, POC: Glucose (POC): 250 mg/dL — ABNORMAL HIGH (ref 65–105)

## 2015-06-15 NOTE — ED Notes (Signed)
12:08 AM  06/15/15     Discharge instructions given to patient (name) with verbalization of understanding. Patient accompanied by [facility] transport crew.  Patient discharged with the following prescriptions none. Patient discharged to [facility] (destination).      Kerin SalenLindsay L Heck, RN

## 2015-08-15 ENCOUNTER — Inpatient Hospital Stay
Admit: 2015-08-15 | Discharge: 2015-08-15 | Disposition: A | Discharge status: Home / Self Care | Payer: MEDICAID | Attending: Emergency Medicine

## 2015-08-15 ENCOUNTER — Emergency Department: Admit: 2015-08-15 | Payer: MEDICAID

## 2015-08-15 DIAGNOSIS — S60221A Contusion of right hand, initial encounter: Secondary | ICD-10-CM

## 2015-08-15 MED ORDER — IBUPROFEN 600 MG TAB
600 mg | ORAL_TABLET | Freq: Four times a day (QID) | ORAL | 0 refills | Status: DC | PRN
Start: 2015-08-15 — End: 2016-04-20

## 2015-08-15 NOTE — ED Notes (Signed)
Right arm splint applied.

## 2015-08-15 NOTE — ED Provider Notes (Signed)
[facility]  Emer[facility] Treatment [facility]    Patient: [name] FlakeDevale S Loudenslager Age: 30 y.o. Sex: female    Date of Birth: 04/16/1985 Admit Date: 08/15/2015 PCP: Lyndon CodeKRISTEN H JALBERT, MD   MRN: [id number]  CSN: [id number]     Room: ER48/ER48 Time Dictated: 9:03 PM      Chief Complaint   Hand pain  History of Present Illness   30 y.o. female her to the emergency department complaining of right hand pain apparently around 3:30 in the morning she hit the wall and today noticed that her hand swollen centigrade and come and get evaluated for this pain may be is around 3 out of 10 but just the swelling got concerned    Review of Systems   Constitutional: No fever, chills, or weight loss  Musculoskeletal: Right hand swelling.  Integumentary: No rashes.  Neurological: No headaches, sensory or motor symptoms.  Denies complaints in all other systems.    Past Medical/Surgical History     Past Medical History:   Diagnosis Date   ??? Diabetes (HCC)    ??? Drug-seeking behavior    ??? Gastroparesis    ??? Hypertension      Past Surgical History:   Procedure Laterality Date   ??? ABDOMEN SURGERY PROC UNLISTED  05/2015    Gastric stimulator pump    ??? HX CESAREAN SECTION         Social History     Social History     Social History   ??? Marital status: SINGLE     Spouse name: N/A   ??? Number of children: N/A   ??? Years of education: N/A     Social History Main Topics   ??? Smoking status: Never Smoker   ??? Smokeless tobacco: None   ??? Alcohol use No   ??? Drug use: No   ??? Sexual activity: Yes     Birth control/ protection: Injection     Other Topics Concern   ??? None     Social History Narrative       Family History   History reviewed. No pertinent family history.    Home Medications     Prior to Admission medications    Medication Sig Start Date End Date Taking? Authorizing Provider   ibuprofen (MOTRIN) 600 mg tablet Take 1 Tab by mouth every six (6) hours as needed for Pain. 08/15/15  Yes Hilaria OtaNelson Dan Scearce, PA    carvedilol (COREG) 12.5 mg tablet Take 12.5 mg by mouth daily.    Phys Other, MD   promethazine (PHENERGAN) 25 mg tablet Take 25 mg by mouth every six (6) hours as needed for Nausea.    Phys Other, MD   traMADol (ULTRAM) 50 mg tablet Take 50 mg by mouth every six (6) hours as needed for Pain. Indications: Pain 04/02/15   Phys Other, MD   dicyclomine (BENTYL) 20 mg tablet Take 20 mg by mouth every six (6) hours.    Phys Other, MD   prochlorperazine (COMPAZINE) 10 mg tablet Take 10 mg by mouth every six (6) hours as needed.    Phys Other, MD   METOCLOPRAMIDE HCL (REGLAN PO) Take 5 mL by mouth daily.    Historical Provider   insulin glargine (LANTUS SOLOSTAR) 100 unit/mL (3 mL) pen 25 Units by SubCUTAneous route nightly.  Patient taking differently: 24 Units by SubCUTAneous route nightly. 01/20/15   Christiana PellantBen A Fickenscher, MD   ondansetron (ZOFRAN ODT) 4 mg disintegrating tablet Take 1 Tab  by mouth every eight (8) hours as needed for Nausea. 01/16/15   Chase Caller, PA-C   lisinopril (PRINIVIL, ZESTRIL) 20 mg tablet Take 20 mg by mouth two (2) times a day.    Phys Other, MD   insulin glulisine (APIDRA SOLOSTAR) 100 unit/mL pen by SubCUTAneous route Before breakfast, lunch, and dinner. Indications: type 1 diabetes mellitus, pt states this is her sliding scale insulin    Jacob Moores, MD       Allergies     Allergies   Allergen Reactions   ??? Morphine Hives   ??? Robaxin [Methocarbamol] Other (comments)     Body shaking, nausea   ??? Penicillins Hives   ??? Zithromax [Azithromycin] Hives       Physical Exam     Visit Vitals   ??? BP 128/88 (BP 1 Location: Left arm, BP Patient Position: At rest;Sitting)   ??? Pulse (!) 101   ??? Temp 98.3 ??F (36.8 ??C)   ??? Resp 18   ??? Ht 5\' 6"  (1.676 m)   ??? Wt 54.4 kg (120 lb)   ??? SpO2 99%   ??? BMI 19.37 kg/m2     Constitutional: Patient appears well developed and well nourished. Marland Kitchen Appearance and behavior are age and situation appropriate.   Musculoskeletal: Nail beds pink with prompt capillary refill right hand pain mild swelling at the fifth metacarpal no wrist pain she is able to flex extend dorsi and radially deviate the wrist has pain when she moves her fifth small finger but she is able to flex and extend at the MCP PIP and DIP joint radial ulnar and capillary refills are normal  Integumentary: warm and dry without rashes or lesions  Neurologic: alert and oriented, Sensation intact, motor strength equal and symmetric.       Impression and Management Plan   30 year old female arrived to the emergency department and planning of right hand pain we will go ahead and do an x-ray to make sure she does not have a boxer's fracture versus contusion  Diagnostic Studies   Lab:   No results found for this or any previous visit (from the past 12 hour(s)).    Imaging:    Xr Hand Rt Min 3 V    Result Date: 08/15/2015  Indication: Pain and swelling fifth metacarpal 3 views right hand show generalized osteopenia. No fracture, dislocation, or other abnormality.     IMPRESSION: No acute abnormality. Osteopenia.     ED Course   Patient remained clinically stable throughout the Emergency Department visit.  Vitals were unremarkable and the patient's condition required no further ED intervention.  The patient is stable for outpatient management with appropriate symptomatic treatment, return precautions, and was advised of the importance of outpatient follow up for ongoing care.    Medical Decision Making   I explained to the patient and we'll place her in a volar splint should use it for the next several days she does not have a fracture to the hand just contused use anti-inflammatories and ice the area  Final Diagnosis       ICD-10-CM ICD-9-CM   1. Contusion of right hand, initial encounter S60.221A 923.20       Disposition   Home  Tylenol Motrin for the pain  Ice Several times a day for at least 15 minutes for 3 days     The patient was personally evaluated by myself and Dr. Thana Ates who agrees with the above assessment and plan  Hilaria Ota, [place]  August 15, 2015    My signature above authenticates this document and my orders, the final ??  diagnosis (es), discharge prescription (s), and instructions in the Epic ??  record.  If you have any questions please contact [phone].  ??  Nursing notes have been reviewed by the physician/ advanced practice ??  Clinician.        Continued by Dr. Thana Ates    I interviewed and examined the patient. I discussed with the mid-level provider agree with their evaluation and plan as documented here.    Patient is seen for injury to her right hand. She was evaluated with an x-ray. She has a good radial pulse and the tenderness is not specifically in her wrist or snuffbox area. She is warm well perfused fingers with good movement and sensation.

## 2015-08-15 NOTE — ED Notes (Signed)
I have reviewed discharge instructions with the patient.  The patient verbalized understanding.

## 2015-08-15 NOTE — ED Triage Notes (Signed)
Hit wall and now has pain/swelling right hand

## 2016-04-20 ENCOUNTER — Emergency Department: Admit: 2016-04-20 | Payer: MEDICAID

## 2016-04-20 ENCOUNTER — Inpatient Hospital Stay
Admit: 2016-04-20 | Discharge: 2016-04-20 | Discharge status: Against Medical Advice | Payer: MEDICAID | Attending: Emergency Medicine

## 2016-04-20 DIAGNOSIS — R1013 Epigastric pain: Secondary | ICD-10-CM

## 2016-04-20 LAB — LIPASE: Lipase: 140 U/L (ref 73–393)

## 2016-04-20 LAB — METABOLIC PANEL, COMPREHENSIVE
ALT (SGPT): 14 U/L (ref 12–78)
AST (SGOT): 16 U/L (ref 15–37)
Albumin: 4 gm/dl (ref 3.4–5.0)
Alk. phosphatase: 119 U/L — ABNORMAL HIGH (ref 45–117)
BUN: 18 mg/dl (ref 7–25)
Bilirubin, total: 0.8 mg/dl (ref 0.2–1.0)
CO2: 23 mEq/L (ref 21–32)
Calcium: 9.3 mg/dl (ref 8.5–10.1)
Chloride: 101 mEq/L (ref 98–107)
Creatinine: 1 mg/dl (ref 0.6–1.3)
GFR est AA: >60
GFR est non-AA: >60
Glucose: 145 mg/dl — ABNORMAL HIGH (ref 74–106)
Potassium: 3.6 mEq/L (ref 3.5–5.1)
Protein, total: 9.1 gm/dl — ABNORMAL HIGH (ref 6.4–8.2)
Sodium: 135 mEq/L — ABNORMAL LOW (ref 136–145)

## 2016-04-20 LAB — POC URINE MACROSCOPIC
Bilirubin: NEGATIVE
Blood: NEGATIVE
Glucose: 250 mg/dl — AB
Ketone: NEGATIVE mg/dl
Leukocyte Esterase: NEGATIVE
Nitrites: NEGATIVE
Protein: 100 mg/dl — AB
Specific gravity: 1.02 (ref 1.005–1.030)
Urobilinogen: 0.2 EU/dl (ref 0.0–1.0)
pH (UA): 8.5 (ref 5–9)

## 2016-04-20 LAB — CBC WITH AUTOMATED DIFF
BASOPHILS: 0.5 % (ref 0–3)
EOSINOPHILS: 0.5 % (ref 0–5)
HCT: 39.9 % (ref 37.0–50.0)
HGB: 13.1 gm/dl (ref 13.0–17.2)
IMMATURE GRANULOCYTES: 0.2 % (ref 0.0–3.0)
LYMPHOCYTES: 22.7 % — ABNORMAL LOW (ref 28–48)
MCH: 27.7 pg (ref 25.4–34.6)
MCHC: 32.8 gm/dl (ref 30.0–36.0)
MCV: 84.4 fL (ref 80.0–98.0)
MONOCYTES: 4.1 % (ref 1–13)
MPV: 12.3 fL — ABNORMAL HIGH (ref 6.0–10.0)
NEUTROPHILS: 72 % — ABNORMAL HIGH (ref 34–64)
NRBC: 0 (ref 0–0)
PLATELET: 297 10*3/uL (ref 140–450)
RBC: 4.73 M/uL (ref 3.60–5.20)
RDW-SD: 40.6 (ref 36.4–46.3)
WBC: 12.3 10*3/uL — ABNORMAL HIGH (ref 4.0–11.0)

## 2016-04-20 LAB — POC TROPONIN: Troponin-I: 0 ng/ml (ref 0.00–0.07)

## 2016-04-20 MED ORDER — SODIUM CHLORIDE 0.9% BOLUS IV
0.9 % | INTRAVENOUS | Status: AC
Start: 2016-04-20 — End: 2016-04-20
  Administered 2016-04-20: 14:00:00 via INTRAVENOUS

## 2016-04-20 MED ORDER — PROMETHAZINE 25 MG/ML INJECTION
25 mg/mL | INTRAMUSCULAR | Status: AC
Start: 2016-04-20 — End: 2016-04-20
  Administered 2016-04-20: 15:00:00 via INTRAMUSCULAR

## 2016-04-20 MED ORDER — LISINOPRIL 20 MG TAB
20 mg | ORAL | Status: AC
Start: 2016-04-20 — End: 2016-04-20
  Administered 2016-04-20: 15:00:00 via ORAL

## 2016-04-20 MED ORDER — DICYCLOMINE 10 MG/ML IM SOLN
10 mg/mL | INTRAMUSCULAR | Status: AC
Start: 2016-04-20 — End: 2016-04-20
  Administered 2016-04-20: 15:00:00 via INTRAMUSCULAR

## 2016-04-20 MED ORDER — PROMETHAZINE 25 MG/ML INJECTION
25 mg/mL | INTRAMUSCULAR | Status: AC
Start: 2016-04-20 — End: 2016-04-20
  Administered 2016-04-20: 14:00:00 via INTRAMUSCULAR

## 2016-04-20 MED ORDER — HALOPERIDOL LACTATE 5 MG/ML IJ SOLN
5 mg/mL | INTRAMUSCULAR | Status: AC
Start: 2016-04-20 — End: 2016-04-20
  Administered 2016-04-20: 14:00:00 via INTRAVENOUS

## 2016-04-20 MED ORDER — DIPHENHYDRAMINE HCL 50 MG/ML IJ SOLN
50 mg/mL | INTRAMUSCULAR | Status: AC
Start: 2016-04-20 — End: 2016-04-20
  Administered 2016-04-20: 14:00:00 via INTRAVENOUS

## 2016-04-20 MED FILL — DIPHENHYDRAMINE HCL 50 MG/ML IJ SOLN: 50 mg/mL | INTRAMUSCULAR | Qty: 1

## 2016-04-20 MED FILL — LISINOPRIL 20 MG TAB: 20 mg | ORAL | Qty: 1

## 2016-04-20 MED FILL — PROMETHAZINE 25 MG/ML INJECTION: 25 mg/mL | INTRAMUSCULAR | Qty: 1

## 2016-04-20 MED FILL — BENTYL 10 MG/ML INTRAMUSCULAR SOLUTION: 10 mg/mL | INTRAMUSCULAR | Qty: 2

## 2016-04-20 MED FILL — HALOPERIDOL LACTATE 5 MG/ML IJ SOLN: 5 mg/mL | INTRAMUSCULAR | Qty: 1

## 2016-04-20 NOTE — ED Notes (Signed)
Pt medicated per MAR. Meds verified and checked.  Pt's ID & allergies were verified with pt & cross-checked with ID band prior to administration.  Pt verbalized understanding of medication being administered.  Pt denies any additional needs at this time.    Will continue to monitor.  Call bell in pt's lap. SR up x 2, bed down/locked.

## 2016-04-20 NOTE — ED Notes (Signed)
Spoke with pt's mother again; confirmed she did speak with pt & as soon as pt finds her keys she will return.

## 2016-04-20 NOTE — ED Notes (Signed)
Pt not in room at this time; not located in any BR in ED. Assumed pt left with IV in arm since no IV noted in room or trashcan & no blood noted on bed.  Provider aware of unable to locate pt @ this time.  Spoke with Mindy, Consulting civil engineerCharge RN who said pt must return on her own or with PD if she is uncooperative so that we may visually ensure PIV removed.  Per her instructions pt called on her phone numbers listed in demographics.   Mobile [phone] with no answer & unable to leave voice mail as "mailbox not set up".  Home [phone]  Spoke with pt's mother on this number; informed mother that pt must return to ED for visual confirmation of PIV out of arm or PD will be called to escort pt back for visual confirmation.

## 2016-04-20 NOTE — ED Triage Notes (Signed)
Per EMS pt with abd pain, left flank pain & N/V; recently seen for sepsis & has kidney issues from that illness. Glucose 157.  Pt did not take her BP meds this am. Denies fever/chills, diarrhea, vaginal bleeding or discharge, or other complaints.

## 2016-04-20 NOTE — ED Notes (Signed)
[facility] [place] [facility] they [facility] pt & bring her back to ER to verify PIV is taken out per policy per Mindy, Consulting civil engineerCharge RN.

## 2016-04-20 NOTE — ED Notes (Signed)
[place] [employer] dispatch called back with officer on other line. Officer at scene verified that pt does not have PIV in her right arm at this time.

## 2016-04-20 NOTE — ED Notes (Signed)
Unable to perform EKG due to pt's nonstop movement on bed; will retry after medication begins working & pt calmer.  Pt reports she has been smoking marijuana yesterday & last night.

## 2016-04-20 NOTE — ED Provider Notes (Signed)
[place]  Emergency Department Treatment Report    Patient: [name] [name] Age: 31 y.o. Sex: female    Date of Birth: Aug 23, 1985 Admit Date: 04/20/2016 PCP: Pecola Lawless, MD   MRN: [id number]  CSN: [id number]  Attending: Lennart Pall, MD   Room: ER15/ER15 Time Dictated: 9:30 AM APP: Enrique Sack, PA-C       Chief Complaint   Chief Complaint   Patient presents with   ??? Vomiting   ??? Abdominal Pain       History of Present Illness   31 y.o. female with history of diabetes, gastroparesis, HTN, drug-seeking behavior per records, presenting to the ED via EMS for evaluation of evaluation of epigastric abdominal pain, left sided mid back pain, nausea and vomiting which started around 0615 when she woke up.  The pain is constant but has intermittent sharp pains, currently 10 out of 10.  She vomited about 5 times since early this morning.  A month ago she was diagnosed with sepsis from a urinary tract infection in a hospital in [place].  She is no longer on antibiotics.  She also mentions some sternal chest pain that is 6 out of 10 and nonradiating.  Denies fevers, chills, diarrhea, blood in vomit or stool, vaginal bleeding or discharge, dysuria.    Review of Systems   Review of Systems   Constitutional: Negative for chills and fever.   HENT: Negative for congestion and sore throat.    Eyes: Negative for discharge and redness.   Respiratory: Negative for cough and shortness of breath.    Cardiovascular: Positive for chest pain.   Gastrointestinal: Positive for abdominal pain, nausea and vomiting. Negative for blood in stool, constipation, diarrhea and melena.   Genitourinary: Positive for frequency. Negative for dysuria.   Musculoskeletal: Positive for back pain.   Skin: Negative for rash.   Neurological: Negative for dizziness.       Past Medical/Surgical History     Past Medical History:   Diagnosis Date   ??? Diabetes ([place])    ??? Drug-seeking behavior    ??? Gastroparesis    ??? Hypertension       Past Surgical History:   Procedure Laterality Date   ??? ABDOMEN SURGERY PROC UNLISTED  05/2015    Gastric stimulator pump    ??? HX CESAREAN SECTION         Social History     Social History     Social History   ??? Marital status: SINGLE     Spouse name: N/A   ??? Number of children: N/A   ??? Years of education: N/A     Social History Main Topics   ??? Smoking status: Never Smoker   ??? Smokeless tobacco: Never Used   ??? Alcohol use No   ??? Drug use: No   ??? Sexual activity: Yes     Birth control/ protection: Injection     Other Topics Concern   ??? None     Social History Narrative       Family History   History reviewed. No pertinent family history.    Current Medications     Prior to Admission Medications   Prescriptions Last Dose Informant Patient Reported? Taking?   carvedilol (COREG) 12.5 mg tablet   Yes Yes   Sig: Take 12.5 mg by mouth daily.   insulin glulisine (APIDRA SOLOSTAR) 100 unit/mL pen   Yes Yes   Sig: by SubCUTAneous route Before breakfast, lunch, and  dinner. Indications: type 1 diabetes mellitus, pt states this is her sliding scale insulin   lisinopril (PRINIVIL, ZESTRIL) 20 mg tablet   Yes Yes   Sig: Take 20 mg by mouth two (2) times a day.      Facility-Administered Medications: None       Allergies     Allergies   Allergen Reactions   ??? Morphine Hives   ??? Robaxin [Methocarbamol] Other (comments)     Body shaking, nausea   ??? Penicillins Hives   ??? Zithromax [Azithromycin] Hives       Physical Exam   ED Triage Vitals   Enc Vitals Group      BP 04/20/16 0915 154/125      Pulse (Heart Rate) 04/20/16 0915 104      Resp Rate 04/20/16 0915 22      Temp 04/20/16 0915 98.2 ??F (36.8 ??C)      Temp src --       O2 Sat (%) 04/20/16 0915 100 %      Weight 04/20/16 0915 108 lb      Height 04/20/16 0915 '5\' 6"'$      Physical Exam  Constitutional: Patient appears well developed and well nourished. Appearance and behavior are age and situation appropriate.  Rocking back  and forth the bed.  Appears uncomfortable.  Nontoxic in appearance.  HEENT: Normocephalic. Conjunctiva clear.  PERRL. Examination of the external ears and nose unremarkable. No rhinorrhea. Mucous membranes moist, non-erythematous. Surface of the pharynx, palate, and tongue are pink, moist and without lesions.  Neck: supple, symmetrical, no masses or JVD visualized.   Respiratory: lungs clear to auscultation, nonlabored respirations. No tachypnea or accessory muscle use.  Cardiovascular: heart tachycardic with regular rhythm without murmur, rubs or gallops. Clear S1, S2. Chest excursion normal. Radial and DP pulses 2+ and equal bilaterally.    Gastrointestinal:  Bowel sounds present. Abdomen soft and nondistended, nontender to palpation. No right lower quadrant tenderness to palpation. No Murphy sign. No organomegaly or masses palpable.  She has a gut stimulator beneath her skin on the left abdominal wall.  Musculoskeletal: Tenderness to palpation to the lower thoracic and left paravertebral musculature without overlying skin change.  No deformity noted on all 4 extremities. No joint or bony tenderness to palpation. Nail beds pink with prompt capillary refill  Back: there is no localized cervical, thoracic, lumbar or sacral body tenderness to palpation. There are no bony step-offs, ecchymoses, areas of soft tissue swelling or deformities.  Integumentary: warm and dry without rashes  Neurologic: alert and oriented.  Answering questions appropriately.  No facial asymmetry or dysarthria.       Impression and Management Plan   This is a 31 y.o. female presenting to the ED for evaluation of abdominal pain, back pain, chest pain and nausea and vomiting.  CBC, CMP, lipase, urine dip, urine pregnancy, chest x-ray, EKG, and troponin will be obtained.  She is afebrile.  Blood pressure elevated slightly tachycardic, otherwise vital signs stable.  She'll be placed on cardiac monitoring.  I  do not believe that her abdominal pain and chest pain are related to ACS.  I do not believe that she is having an aortic dissection.  Prior notes reviewed, she is seen in the ED fairly frequently last year for gastroparesis, abdominal pain, nausea and vomiting.      Diagnostic Studies   Lab:   Recent Results (from the past 12 hour(s))   POC URINE MACROSCOPIC    Collection Time:  04/20/16  9:19 AM   Result Value Ref Range    Glucose 250 (A) NEGATIVE,Negative mg/dl    Bilirubin Negative NEGATIVE,Negative      Ketone Negative NEGATIVE,Negative mg/dl    Specific gravity 1.020 1.005 - 1.030      Blood Negative NEGATIVE,Negative      pH (UA) 8.5 5 - 9      Protein 100 (A) NEGATIVE,Negative mg/dl    Urobilinogen 0.2 0.0 - 1.0 EU/dl    Nitrites Negative NEGATIVE,Negative      Leukocyte Esterase Negative NEGATIVE,Negative      Color Yellow      Appearance Clear     CBC WITH AUTOMATED DIFF    Collection Time: 04/20/16  9:45 AM   Result Value Ref Range    WBC 12.3 (H) 4.0 - 11.0 1000/mm3    RBC 4.73 3.60 - 5.20 M/uL    HGB 13.1 13.0 - 17.2 gm/dl    HCT 39.9 37.0 - 50.0 %    MCV 84.4 80.0 - 98.0 fL    MCH 27.7 25.4 - 34.6 pg    MCHC 32.8 30.0 - 36.0 gm/dl    PLATELET 297 140 - 450 1000/mm3    MPV 12.3 (H) 6.0 - 10.0 fL    RDW-SD 40.6 36.4 - 46.3      NRBC 0 0 - 0      IMMATURE GRANULOCYTES 0.2 0.0 - 3.0 %    NEUTROPHILS 72.0 (H) 34 - 64 %    LYMPHOCYTES 22.7 (L) 28 - 48 %    MONOCYTES 4.1 1 - 13 %    EOSINOPHILS 0.5 0 - 5 %    BASOPHILS 0.5 0 - 3 %   LIPASE    Collection Time: 04/20/16  9:45 AM   Result Value Ref Range    Lipase 140 73 - 182 U/L   METABOLIC PANEL, COMPREHENSIVE    Collection Time: 04/20/16  9:45 AM   Result Value Ref Range    Sodium 135 (L) 136 - 145 mEq/L    Potassium 3.6 3.5 - 5.1 mEq/L    Chloride 101 98 - 107 mEq/L    CO2 23 21 - 32 mEq/L    Glucose 145 (H) 74 - 106 mg/dl    BUN 18 7 - 25 mg/dl    Creatinine 1.0 0.6 - 1.3 mg/dl    GFR est AA >60.0      GFR est non-AA >60      Calcium 9.3 8.5 - 10.1 mg/dl     AST (SGOT) 16 15 - 37 U/L    ALT (SGPT) 14 12 - 78 U/L    Alk. phosphatase 119 (H) 45 - 117 U/L    Bilirubin, total 0.8 0.2 - 1.0 mg/dl    Protein, total 9.1 (H) 6.4 - 8.2 gm/dl    Albumin 4.0 3.4 - 5.0 gm/dl   POC TROPONIN-I    Collection Time: 04/20/16  9:47 AM   Result Value Ref Range    Troponin-I 0.00 0.00 - 0.07 ng/ml       Imaging:    Xr Chest Pa Lat    Result Date: 04/20/2016  Indication: Chest pain PA and lateral chest  The heart is normal in size. Lungs are clear and the bony thorax is intact. No effusions or  other abnormalities are seen.     Impression: Normal study.       EKG: Normal sinus rhythm.  Ventricular rate 93 bpm.  Normal intervals. Dr. Dennison Bulla  did not see any acute S-T segment or T-wave abnormalities that are consistent with acute ischemia or infarction.    Medical Decision Making/ED Course   Patient remained stable during her stay.  She did not develop any new or worsening symptoms.  No evidence of ACS.  Chest x-ray normal.  Mild leukocytosis of 12.3.  No evidence of UTI.  Glucose 145.  She received 1 L of IV normal saline.  Lipase normal.  No evidence of hepatitis or pancreatitis on laboratory results.  I went to reevaluate the patient and she had eloped.  Did not appear that her IV was taken out.  The nurse call the patient telling her to return to the ED immediately to have the IV removed.  Patient states she would come back in for IV removal.  I believe she is safe to go home.  Blood pressure elevated, she has history hypertension and was given her oral lisinopril.      Procedures    Medications   promethazine (PHENERGAN) injection 25 mg (25 mg IntraMUSCular Given 04/20/16 0958)   diphenhydrAMINE (BENADRYL) injection 25 mg (25 mg IntraVENous Given 04/20/16 0956)   sodium chloride 0.9 % bolus infusion 1,000 mL (0 mL IntraVENous IV Completed 04/20/16 1122)   haloperidol lactate (HALDOL) injection 2.5 mg (2.5 mg IntraVENous Given 04/20/16 1002)    promethazine (PHENERGAN) injection 25 mg (25 mg IntraMUSCular Given 04/20/16 1119)   dicyclomine (BENTYL) 10 mg/mL injection 20 mg (20 mg IntraMUSCular Given 04/20/16 1116)   lisinopril (PRINIVIL, ZESTRIL) tablet 20 mg (20 mg Oral Given 04/20/16 1116)        Final Diagnosis       ICD-10-CM ICD-9-CM   1. Abdominal pain, epigastric R10.13 789.06   2. Acute left-sided thoracic back pain M54.6 724.1   3. Non-intractable vomiting with nausea, unspecified vomiting type R11.2 787.01   4. Chest pain, unspecified type R07.9 786.50       Disposition     Patient eloped      The patient was personally evaluated by myself and BEN Elveria Rising, MD who agrees with the above assessment and plan.    Dragon medical dictation software was used for portions of this report. Unintended transcription errors may occur.     Gwenith Daily, PA-C  April 20, 2016      My signature above authenticates this document and my orders, the final ??  diagnosis (es), discharge prescription (s), and instructions in the Epic record.  If you have any questions please contact [phone].  ??  Nursing notes have been reviewed by the physician/ advanced practice Clinician.

## 2016-04-20 NOTE — ED Notes (Signed)
Pt has not shown up to ER. Per Charge RN Mindy, give pt one more call & if still does not show up call PD.  Called phone numbers again. Again spoke with pt's mother & informed her that if pt does not return within next 10 minutes I would be required to call PD to bring her back to ER.

## 2016-04-21 LAB — EKG 12-LEAD
Atrial Rate: 93 {beats}/min
Diagnosis: NORMAL
P Axis: 75 degrees
P-R Interval: 114 ms
Q-T Interval: 370 ms
QRS Duration: 82 ms
QTc Calculation (Bazett): 460 ms
R Axis: 71 degrees
T Axis: 58 degrees
Ventricular Rate: 93 {beats}/min

## 2016-04-21 LAB — EKG, 12 LEAD, INITIAL
Atrial Rate: 93 {beats}/min
Calculated P Axis: 75 degrees
Calculated R Axis: 71 degrees
Calculated T Axis: 58 degrees
Diagnosis: NORMAL
P-R Interval: 114 ms
Q-T Interval: 370 ms
QRS Duration: 82 ms
QTC Calculation (Bezet): 460 ms
Ventricular Rate: 93 {beats}/min

## 2016-04-21 LAB — POC HCG,URINE: HCG urine, QL: NEGATIVE

## 2016-08-11 ENCOUNTER — Inpatient Hospital Stay
Admit: 2016-08-11 | Discharge: 2016-08-11 | Disposition: A | Discharge status: Home / Self Care | Payer: Self-pay | Attending: Emergency Medicine

## 2016-08-11 DIAGNOSIS — R1011 Right upper quadrant pain: Secondary | ICD-10-CM

## 2016-08-11 LAB — HEPATIC FUNCTION PANEL
ALT (SGPT): 15 U/L (ref 12–78)
AST (SGOT): 17 U/L (ref 15–37)
Albumin: 3.7 gm/dl (ref 3.4–5.0)
Alk. phosphatase: 120 U/L — ABNORMAL HIGH (ref 45–117)
Bilirubin, direct: 0.1 mg/dl (ref 0.0–0.2)
Bilirubin, total: 0.7 mg/dl (ref 0.2–1.0)
Protein, total: 8.4 gm/dl — ABNORMAL HIGH (ref 6.4–8.2)

## 2016-08-11 LAB — CBC WITH AUTOMATED DIFF
BASOPHILS: 0.3 % (ref 0–3)
EOSINOPHILS: 0 % (ref 0–5)
HCT: 36.1 % — ABNORMAL LOW (ref 37.0–50.0)
HGB: 12.2 gm/dl — ABNORMAL LOW (ref 13.0–17.2)
IMMATURE GRANULOCYTES: 0.2 % (ref 0.0–3.0)
LYMPHOCYTES: 15.1 % — ABNORMAL LOW (ref 28–48)
MCH: 27.5 pg (ref 25.4–34.6)
MCHC: 33.8 gm/dl (ref 30.0–36.0)
MCV: 81.5 fL (ref 80.0–98.0)
MONOCYTES: 3.8 % (ref 1–13)
MPV: 11.8 fL — ABNORMAL HIGH (ref 6.0–10.0)
NEUTROPHILS: 80.6 % — ABNORMAL HIGH (ref 34–64)
NRBC: 0 (ref 0–0)
PLATELET: 264 10*3/uL (ref 140–450)
RBC: 4.43 M/uL (ref 3.60–5.20)
RDW-SD: 40.1 (ref 36.4–46.3)
WBC: 12.1 10*3/uL — ABNORMAL HIGH (ref 4.0–11.0)

## 2016-08-11 LAB — GLUCOSE, POC: Glucose (POC): 243 mg/dL — ABNORMAL HIGH (ref 65–105)

## 2016-08-11 LAB — POC CHEM8
BUN: 15 mg/dl (ref 7–25)
CALCIUM,IONIZED: 3.9 mg/dL — ABNORMAL LOW (ref 4.40–5.40)
CO2, TOTAL: 24 mmol/L (ref 21–32)
Chloride: 98 mEq/L (ref 98–107)
Creatinine: 0.9 mg/dl (ref 0.6–1.3)
Glucose: 457 mg/dL — CR (ref 74–106)
HCT: 42 % (ref 38–45)
HGB: 14.3 gm/dl (ref 13.0–17.2)
Potassium: 4.1 mEq/L (ref 3.5–4.9)
Sodium: 132 mEq/L — ABNORMAL LOW (ref 136–145)

## 2016-08-11 LAB — CK: CK: 251 U/L — ABNORMAL HIGH (ref 26–192)

## 2016-08-11 LAB — LIPASE: Lipase: 74 U/L (ref 73–393)

## 2016-08-11 MED ORDER — SODIUM CHLORIDE 0.9% BOLUS IV
0.9 % | INTRAVENOUS | Status: AC
Start: 2016-08-11 — End: 2016-08-11
  Administered 2016-08-11: 07:00:00 via INTRAVENOUS

## 2016-08-11 MED ORDER — ONDANSETRON (PF) 4 MG/2 ML INJECTION
4 mg/2 mL | Freq: Once | INTRAMUSCULAR | Status: AC
Start: 2016-08-11 — End: 2016-08-11
  Administered 2016-08-11: 07:00:00 via INTRAVENOUS

## 2016-08-11 MED ORDER — ONDANSETRON HCL 4 MG TAB
4 mg | ORAL_TABLET | Freq: Three times a day (TID) | ORAL | 0 refills | Status: DC | PRN
Start: 2016-08-11 — End: 2017-08-08

## 2016-08-11 MED ORDER — SODIUM CHLORIDE 0.9 % IJ SYRG
Freq: Once | INTRAMUSCULAR | Status: AC
Start: 2016-08-11 — End: 2016-08-11
  Administered 2016-08-11: 05:00:00 via INTRAVENOUS

## 2016-08-11 MED ORDER — LISINOPRIL 20 MG TAB
20 mg | ORAL | Status: AC
Start: 2016-08-11 — End: 2016-08-11
  Administered 2016-08-11: 07:00:00 via ORAL

## 2016-08-11 MED ORDER — DIPHENHYDRAMINE HCL 50 MG/ML IJ SOLN
50 mg/mL | INTRAMUSCULAR | Status: AC
Start: 2016-08-11 — End: 2016-08-11
  Administered 2016-08-11: 08:00:00 via INTRAVENOUS

## 2016-08-11 MED ORDER — ONDANSETRON (PF) 4 MG/2 ML INJECTION
4 mg/2 mL | Freq: Once | INTRAMUSCULAR | Status: AC
Start: 2016-08-11 — End: 2016-08-11
  Administered 2016-08-11: 05:00:00 via INTRAVENOUS

## 2016-08-11 MED ORDER — SODIUM CHLORIDE 0.9% BOLUS IV
0.9 % | INTRAVENOUS | Status: AC
Start: 2016-08-11 — End: 2016-08-11
  Administered 2016-08-11: 05:00:00 via INTRAVENOUS

## 2016-08-11 MED ORDER — PROMETHAZINE IN NS 12.5 MG/50 ML IV PIGGY BAG
12.5 mg/50 ml | INTRAVENOUS | Status: DC
Start: 2016-08-11 — End: 2016-08-11

## 2016-08-11 MED ORDER — LORAZEPAM 2 MG/ML IJ SOLN
2 mg/mL | Freq: Once | INTRAMUSCULAR | Status: AC
Start: 2016-08-11 — End: 2016-08-11
  Administered 2016-08-11: 05:00:00 via INTRAVENOUS

## 2016-08-11 MED ORDER — INSULIN LISPRO 100 UNIT/ML INJECTION
100 unit/mL | SUBCUTANEOUS | Status: AC
Start: 2016-08-11 — End: 2016-08-11
  Administered 2016-08-11: 06:00:00 via SUBCUTANEOUS

## 2016-08-11 MED FILL — LORAZEPAM 2 MG/ML IJ SOLN: 2 mg/mL | INTRAMUSCULAR | Qty: 1

## 2016-08-11 MED FILL — ONDANSETRON (PF) 4 MG/2 ML INJECTION: 4 mg/2 mL | INTRAMUSCULAR | Qty: 2

## 2016-08-11 MED FILL — DIPHENHYDRAMINE HCL 50 MG/ML IJ SOLN: 50 mg/mL | INTRAMUSCULAR | Qty: 1

## 2016-08-11 MED FILL — LISINOPRIL 20 MG TAB: 20 mg | ORAL | Qty: 1

## 2016-08-11 MED FILL — INSULIN LISPRO 100 UNIT/ML INJECTION: 100 unit/mL | SUBCUTANEOUS | Qty: 1

## 2016-08-11 MED FILL — BD POSIFLUSH NORMAL SALINE 0.9 % INJECTION SYRINGE: INTRAMUSCULAR | Qty: 10

## 2016-08-11 NOTE — ED Notes (Signed)
Bedside shift change report given to Erskine SquibbJane RN (oncoming nurse) by Aram CandelaJohn R RN (offgoing nurse). Report included the following information ED Summary.

## 2016-08-11 NOTE — ED Provider Notes (Addendum)
[name] [name]  Emergency Department Treatment Report        Patient: [name] [name] Age: 31 y.o. Sex: female    Date of Birth: 11-29-1985 Admit Date: 08/11/2016 PCP: Pecola Lawless, MD   MRN: [id number]  CSN: [id number]  Attending: Joice Lofts, MD   Room: ER10/ER10 Time Dictated: 12:31 AM [id number]       Chief Complaint   Body pain    History of Present Illness   31 y.o. female complaining of pain all over.  She states her body is sore and cramping.  It started this evening, constant and severe, associated with nausea and vomiting.  She did not take anything for the pain.  She has type 1 diabetes and states last had her insulin yesterday evening she hasn't eaten anything today but states her blood sugar was 98 earlier.  She has a history of gastroparesis but states she does not usually have this pain all over with her gastroparesis    Review of Systems   Review of Systems   Constitutional: Negative for diaphoresis and fever.   HENT: Positive for congestion. Negative for sore throat.    Eyes: Negative for discharge and redness.   Respiratory: Positive for cough and sputum production. Negative for wheezing.    Cardiovascular: Negative for chest pain and leg swelling.   Gastrointestinal: Positive for abdominal pain and vomiting.   Genitourinary: Positive for frequency. Negative for dysuria.   Musculoskeletal: Positive for myalgias.   Skin: Negative for itching and rash.   Neurological: Negative for focal weakness and loss of consciousness.       Past Medical/Surgical History     Past Medical History:   Diagnosis Date   ??? Diabetes ([place])    ??? Drug-seeking behavior    ??? Gastroparesis    ??? Gastroparesis    ??? Hypertension      Past Surgical History:   Procedure Laterality Date   ??? ABDOMEN SURGERY PROC UNLISTED  05/2015    Gastric stimulator pump    ??? HX CESAREAN SECTION       Gastric stimulator  Social History     Social History     Social History   ??? Marital status: SINGLE     Spouse name: N/A    ??? Number of children: N/A   ??? Years of education: N/A     Occupational History   ??? Not on file.     Social History Main Topics   ??? Smoking status: Never Smoker   ??? Smokeless tobacco: Never Used   ??? Alcohol use No   ??? Drug use: No   ??? Sexual activity: Yes     Birth control/ protection: Injection     Other Topics Concern   ??? Not on file     Social History Narrative       Family History   Positive family history of diabetes    Current Medications     Prior to Admission Medications   Prescriptions Last Dose Informant Patient Reported? Taking?   carvedilol (COREG) 12.5 mg tablet   Yes No   Sig: Take 12.5 mg by mouth daily.   insulin glulisine (APIDRA SOLOSTAR) 100 unit/mL pen   Yes No   Sig: by SubCUTAneous route Before breakfast, lunch, and dinner. Indications: type 1 diabetes mellitus, pt states this is her sliding scale insulin   lisinopril (PRINIVIL, ZESTRIL) 20 mg tablet   Yes No   Sig: Take 20 mg by mouth  two (2) times a day.      Facility-Administered Medications: None       Allergies     Allergies   Allergen Reactions   ??? Morphine Hives   ??? Robaxin [Methocarbamol] Other (comments)     Body shaking, nausea   ??? Penicillins Hives   ??? Zithromax [Azithromycin] Hives       Physical Exam   ED Triage Vitals   ED Encounter Vitals Group      BP 08/11/16 0024 177/129      Pulse (Heart Rate) 08/11/16 0024 111      Resp Rate 08/11/16 0024 20      Temp 08/11/16 0024 98.2 ??F (36.8 ??C)      Temp src --       O2 Sat (%) 08/11/16 0024 100 %      Weight --       Height --      Physical Exam   Constitutional: She is oriented to person, place, and time.   Very thin female alert and rocking because of  pain   HENT:   Head: Normocephalic and atraumatic.   Mouth/Throat: Oropharynx is clear and moist.   Eyes: Conjunctivae are normal. Pupils are equal, round, and reactive to light. No scleral icterus.   Neck: Neck supple. No thyromegaly present.   Cardiovascular: Regular rhythm.    No murmur heard.  Tachycardic    Pulmonary/Chest: Effort normal. No respiratory distress. She has no wheezes.   Abdominal: Soft.   No abdominal tenderness appreciable on exam, no guarding or rebound, no masses palpable   Musculoskeletal:   No joint redness or swelling   Lymphadenopathy:     She has no cervical adenopathy.   Neurological: She is alert and oriented to person, place, and time.   no focal deficit, using all extremities well   Skin: Skin is warm and dry. No rash noted.   Vitals reviewed.       Impression and Management Plan   Patient who presents with myalgias as well as vomiting and is a type I diabetic we will check electrolytes, evaluate for DKA, evaluate for rhabdomyolysis    Diagnostic Studies   Lab:   Recent Results (from the past 12 hour(s))   HEPATIC FUNCTION PANEL    Collection Time: 08/11/16 12:55 AM   Result Value Ref Range    AST (SGOT) 17 15 - 37 U/L    ALT (SGPT) 15 12 - 78 U/L    Alk. phosphatase 120 (H) 45 - 117 U/L    Bilirubin, total 0.7 0.2 - 1.0 mg/dl    Protein, total 8.4 (H) 6.4 - 8.2 gm/dl    Albumin 3.7 3.4 - 5.0 gm/dl    Bilirubin, direct 0.1 0.0 - 0.2 mg/dl   LIPASE    Collection Time: 08/11/16 12:55 AM   Result Value Ref Range    Lipase 74 73 - 393 U/L   CK    Collection Time: 08/11/16 12:55 AM   Result Value Ref Range    CK 251 (H) 26 - 192 U/L   POC CHEM8    Collection Time: 08/11/16 12:57 AM   Result Value Ref Range    Sodium 132 (L) 136 - 145 mEq/L    Potassium 4.1 3.5 - 4.9 mEq/L    Chloride 98 98 - 107 mEq/L    CO2, TOTAL 24 21 - 32 mmol/L    Glucose 457 (HH) 74 - 106 mg/dL    BUN 15  7 - 25 mg/dl    Creatinine 0.9 0.6 - 1.3 mg/dl    HCT 42 38 - 45 %    HGB 14.3 13.0 - 17.2 gm/dl    CALCIUM,IONIZED 3.90 (L) 4.40 - 5.40 mg/dL   CBC WITH AUTOMATED DIFF    Collection Time: 08/11/16  1:12 AM   Result Value Ref Range    WBC 12.1 (H) 4.0 - 11.0 1000/mm3    RBC 4.43 3.60 - 5.20 M/uL    HGB 12.2 (L) 13.0 - 17.2 gm/dl    HCT 36.1 (L) 37.0 - 50.0 %    MCV 81.5 80.0 - 98.0 fL    MCH 27.5 25.4 - 34.6 pg     MCHC 33.8 30.0 - 36.0 gm/dl    PLATELET 264 140 - 450 1000/mm3    MPV 11.8 (H) 6.0 - 10.0 fL    RDW-SD 40.1 36.4 - 46.3      NRBC 0 0 - 0      IMMATURE GRANULOCYTES 0.2 0.0 - 3.0 %    NEUTROPHILS 80.6 (H) 34 - 64 %    LYMPHOCYTES 15.1 (L) 28 - 48 %    MONOCYTES 3.8 1 - 13 %    EOSINOPHILS 0.0 0 - 5 %    BASOPHILS 0.3 0 - 3 %   GLUCOSE, POC    Collection Time: 08/11/16  2:47 AM   Result Value Ref Range    Glucose (POC) 243 (H) 65 - 105 mg/dL     Labs Reviewed   HEPATIC FUNCTION PANEL - Abnormal; Notable for the following:        Result Value    Alk. phosphatase 120 (*)     Protein, total 8.4 (*)     All other components within normal limits   CK - Abnormal; Notable for the following:     CK 251 (*)     All other components within normal limits   CBC WITH AUTOMATED DIFF - Abnormal; Notable for the following:     WBC 12.1 (*)     HGB 12.2 (*)     HCT 36.1 (*)     MPV 11.8 (*)     NEUTROPHILS 80.6 (*)     LYMPHOCYTES 15.1 (*)     All other components within normal limits   POC CHEM8 - Abnormal; Notable for the following:     Sodium 132 (*)     Glucose 457 (*)     CALCIUM,IONIZED 3.90 (*)     All other components within normal limits   GLUCOSE, POC - Abnormal; Notable for the following:     Glucose (POC) 243 (*)     All other components within normal limits   LIPASE   POC CHEM8   GLUCOSE, POC         ED Course     Medications   lisinopril (PRINIVIL, ZESTRIL) tablet 20 mg (20 mg Oral Refused 08/11/16 0238)   diphenhydrAMINE (BENADRYL) injection 12.5 mg (not administered)   sodium chloride (NS) flush 5-10 mL (10 mL IntraVENous Given 08/11/16 0105)   sodium chloride 0.9 % bolus infusion 1,000 mL (0 mL IntraVENous IV Completed 08/11/16 0310)   ondansetron (ZOFRAN) injection 4 mg (4 mg IntraVENous Given 08/11/16 0106)   LORazepam (ATIVAN) injection 0.5 mg (0.5 mg IntraVENous Given 08/11/16 0106)   insulin lispro (HUMALOG) injection 10 Units (10 Units SubCUTAneous Given 08/11/16 0148)    sodium chloride 0.9 % bolus infusion 1,000 mL (1,000 mL IntraVENous New  Bag 08/11/16 0259)   ondansetron (ZOFRAN) injection 4 mg (4 mg IntraVENous Given 08/11/16 0238)     ED Course   Patient seems more comfortable after the Zofran and Ativan, she is sleeping no distress and no vomiting    Patient's blood sugar improved with insulin IV fluid  Medical Decision Making   Patient is not in DKA but hyperglycemic and did not take her insulin.    Continuation:  I, Dr. Sedonia Small, have personally seen and examined this patient; I have fully participated in the care of this patient with the advanced practice provider.  I have reviewed and agree with all pertinent clinical information including history, physical exam, labs, radiographic studies and the plan.  I have also reviewed and agree with the medications, allergies and past medical history sections for this patient.  Patient presents with complaints  generalized muscular movements, non-focal, complaining of myalgias, hyperglycemia secondary to non-compliance, received fluids and SQ insulin and blood sugar improves.    Joice Lofts, MD  August 15, 2016  Patient with    Final Diagnosis       ICD-10-CM ICD-9-CM   1. Hyperglycemia R73.9 790.29   2. Nausea and vomiting, intractability of vomiting not specified, unspecified vomiting type R11.2 787.01   3. Myalgia M79.1 729.1       Disposition   Follow up with her primary care physician,  Zofran for nausea  Current Discharge Medication List      START taking these medications    Details   ondansetron hcl (ZOFRAN) 4 mg tablet Take 1 Tab by mouth every eight (8) hours as needed for Nausea.  Qty: 10 Tab, Refills: 0             The patient was personally evaluated by myself and Dr. Joice Lofts, MD who agrees with the above assessment and plan.    Benay Pike, PA-C  August 11, 2016    My signature above authenticates this document and my orders, the final ??   diagnosis (es), discharge prescription (s), and instructions in the Epic ??  record.  If you have any questions please contact [phone].  ??  Nursing notes have been reviewed by the physician/ advanced practice ??  Clinician.    Dragon medical dictation software was used for portions of this report. Unintended voice recognition errors may occur.

## 2016-08-11 NOTE — ED Notes (Signed)
4:26 AM  08/11/16     Discharge instructions given to patient (name) with verbalization of understanding. Patient accompanied by family.  Patient discharged with the following prescriptions zofran. Patient discharged to home (destination).      Brendolyn PattyJane Sucgang, RN

## 2016-08-11 NOTE — ED Triage Notes (Signed)
One history of left sided abd pain that started the afternoon.  She has been having nausea and vomiting.

## 2017-08-08 ENCOUNTER — Inpatient Hospital Stay
Admit: 2017-08-08 | Discharge: 2017-08-08 | Disposition: A | Discharge status: Home / Self Care | Payer: MEDICARE | Attending: Emergency Medicine

## 2017-08-08 DIAGNOSIS — E1043 Type 1 diabetes mellitus with diabetic autonomic (poly)neuropathy: Secondary | ICD-10-CM

## 2017-08-08 LAB — POC URINE MACROSCOPIC
Bilirubin, Urine: NEGATIVE
Bilirubin: NEGATIVE
Glucose, Ur: 500 mg/dl — AB
Glucose: 500 mg/dl — AB
Ketone: >=160 mg/dl — AB
Ketones, Urine: >=160 mg/dl — AB
Leukocyte Esterase, Urine: NEGATIVE
Leukocyte Esterase: NEGATIVE
Nitrite, Urine: NEGATIVE
Nitrites: NEGATIVE
Protein, UA: >=300 mg/dl — AB
Protein: >=300 mg/dl — AB
Specific Gravity, UA: 1.025 (ref 1.005–1.030)
Specific gravity: 1.025 (ref 1.005–1.030)
Urobilinogen, UA, POCT: 0.2 EU/dl (ref 0.0–1.0)
Urobilinogen: 0.2 EU/dl (ref 0.0–1.0)
pH (UA): 6 (ref 5–9)
pH, UA: 6 (ref 5–9)

## 2017-08-08 LAB — BASIC METABOLIC PANEL
Anion Gap: 9 mmol/L (ref 5–15)
BUN: 22 mg/dl (ref 7–25)
CO2: 18 mEq/L — ABNORMAL LOW (ref 21–32)
Calcium: 6.9 mg/dl — ABNORMAL LOW (ref 8.5–10.1)
Chloride: 109 mEq/L — ABNORMAL HIGH (ref 98–107)
Creatinine: 1 mg/dl (ref 0.6–1.3)
EGFR IF NonAfrican American: >60
GFR African American: >60
Glucose: 296 mg/dl — ABNORMAL HIGH (ref 74–106)
Potassium: 4.2 mEq/L (ref 3.5–5.1)
Sodium: 136 mEq/L (ref 136–145)

## 2017-08-08 LAB — CBC WITH AUTO DIFFERENTIAL
Basophils %: 0.2 % (ref 0–3)
Eosinophils %: 0 % (ref 0–5)
Hematocrit: 39.6 % (ref 37.0–50.0)
Hemoglobin: 12.8 gm/dl — ABNORMAL LOW (ref 13.0–17.2)
Immature Granulocytes: 0.4 % (ref 0.0–3.0)
Lymphocytes %: 8.5 % — ABNORMAL LOW (ref 28–48)
MCH: 27.2 pg (ref 25.4–34.6)
MCHC: 32.3 gm/dl (ref 30.0–36.0)
MCV: 84.3 fL (ref 80.0–98.0)
MPV: 12.8 fL — ABNORMAL HIGH (ref 6.0–10.0)
Monocytes %: 1.1 % (ref 1–13)
Neutrophils %: 89.8 % — ABNORMAL HIGH (ref 34–64)
Nucleated RBCs: 0 (ref 0–0)
Platelets: 256 10*3/uL (ref 140–450)
RBC: 4.7 M/uL (ref 3.60–5.20)
RDW-SD: 41.5 (ref 36.4–46.3)
WBC: 17.9 10*3/uL — ABNORMAL HIGH (ref 4.0–11.0)

## 2017-08-08 LAB — COMPREHENSIVE METABOLIC PANEL
ALT: 20 U/L (ref 12–78)
AST: 36 U/L (ref 15–37)
Albumin: 4.4 gm/dl (ref 3.4–5.0)
Alkaline Phosphatase: 128 U/L — ABNORMAL HIGH (ref 45–117)
Anion Gap: 12 mmol/L (ref 5–15)
BUN: 21 mg/dl (ref 7–25)
CO2: 19 mEq/L — ABNORMAL LOW (ref 21–32)
Calcium: 9.4 mg/dl (ref 8.5–10.1)
Chloride: 101 mEq/L (ref 98–107)
Creatinine: 1.2 mg/dl (ref 0.6–1.3)
EGFR IF NonAfrican American: 56
GFR African American: >60
Glucose: 297 mg/dl — ABNORMAL HIGH (ref 74–106)
Potassium: 4.7 mEq/L (ref 3.5–5.1)
Sodium: 132 mEq/L — ABNORMAL LOW (ref 136–145)
Total Bilirubin: 1 mg/dl (ref 0.2–1.0)
Total Protein: 9 gm/dl — ABNORMAL HIGH (ref 6.4–8.2)

## 2017-08-08 LAB — POC HCG,URINE
HCG urine, QL: NEGATIVE
Pregnancy Test(Urn): NEGATIVE

## 2017-08-08 LAB — DRUG SCREEN, URINE
Amphetamine: NEGATIVE
Amphetamines: NEGATIVE
Barbiturates: NEGATIVE
Barbiturates: NEGATIVE
Benzodiazapines: NEGATIVE
Benzodiazepines: NEGATIVE
Cocaine: NEGATIVE
Cocaine: NEGATIVE
Marijuana: POSITIVE — AB
Marijuana: POSITIVE — AB
Methadone: NEGATIVE
Methadone: NEGATIVE
Opiates: NEGATIVE
Opiates: NEGATIVE
Phencyclidine: NEGATIVE
Phencyclidine: NEGATIVE

## 2017-08-08 LAB — LIPASE
Lipase: 38 U/L — ABNORMAL LOW (ref 73–393)
Lipase: 38 U/L — ABNORMAL LOW (ref 73–393)

## 2017-08-08 LAB — HEMOGLOBIN A1C W/O EAG
Hemoglobin A1C: 9.1 % — ABNORMAL HIGH (ref 4.2–6.3)
Hemoglobin A1c: 9.1 % — ABNORMAL HIGH (ref 4.2–6.3)

## 2017-08-08 LAB — POCT GLUCOSE
POC Glucose: 264 mg/dL — ABNORMAL HIGH (ref 65–105)
POC Glucose: 369 mg/dL — ABNORMAL HIGH (ref 65–105)

## 2017-08-08 LAB — LACTIC ACID
LACTIC ACID: 1.8 mmol/L (ref 0.4–2.0)
Lactic Acid: 1.8 mmol/L (ref 0.4–2.0)

## 2017-08-08 LAB — CBC WITH AUTOMATED DIFF
BASOPHILS: 0.2 % (ref 0–3)
EOSINOPHILS: 0 % (ref 0–5)
HCT: 39.6 % (ref 37.0–50.0)
HGB: 12.8 gm/dl — ABNORMAL LOW (ref 13.0–17.2)
IMMATURE GRANULOCYTES: 0.4 % (ref 0.0–3.0)
LYMPHOCYTES: 8.5 % — ABNORMAL LOW (ref 28–48)
MCH: 27.2 pg (ref 25.4–34.6)
MCHC: 32.3 gm/dl (ref 30.0–36.0)
MCV: 84.3 fL (ref 80.0–98.0)
MONOCYTES: 1.1 % (ref 1–13)
MPV: 12.8 fL — ABNORMAL HIGH (ref 6.0–10.0)
NEUTROPHILS: 89.8 % — ABNORMAL HIGH (ref 34–64)
NRBC: 0 (ref 0–0)
PLATELET: 256 10*3/uL (ref 140–450)
RBC: 4.7 M/uL (ref 3.60–5.20)
RDW-SD: 41.5 (ref 36.4–46.3)
WBC: 17.9 10*3/uL — ABNORMAL HIGH (ref 4.0–11.0)

## 2017-08-08 LAB — METABOLIC PANEL, BASIC
Anion gap: 9 mmol/L (ref 5–15)
BUN: 22 mg/dl (ref 7–25)
CO2: 18 mEq/L — ABNORMAL LOW (ref 21–32)
Calcium: 6.9 mg/dl — ABNORMAL LOW (ref 8.5–10.1)
Chloride: 109 mEq/L — ABNORMAL HIGH (ref 98–107)
Creatinine: 1 mg/dl (ref 0.6–1.3)
GFR est AA: >60
GFR est non-AA: >60
Glucose: 296 mg/dl — ABNORMAL HIGH (ref 74–106)
Potassium: 4.2 mEq/L (ref 3.5–5.1)
Sodium: 136 mEq/L (ref 136–145)

## 2017-08-08 LAB — METABOLIC PANEL, COMPREHENSIVE
ALT (SGPT): 20 U/L (ref 12–78)
AST (SGOT): 36 U/L (ref 15–37)
Albumin: 4.4 gm/dl (ref 3.4–5.0)
Alk. phosphatase: 128 U/L — ABNORMAL HIGH (ref 45–117)
Anion gap: 12 mmol/L (ref 5–15)
BUN: 21 mg/dl (ref 7–25)
Bilirubin, total: 1 mg/dl (ref 0.2–1.0)
CO2: 19 mEq/L — ABNORMAL LOW (ref 21–32)
Calcium: 9.4 mg/dl (ref 8.5–10.1)
Chloride: 101 mEq/L (ref 98–107)
Creatinine: 1.2 mg/dl (ref 0.6–1.3)
GFR est AA: >60
GFR est non-AA: 56
Glucose: 297 mg/dl — ABNORMAL HIGH (ref 74–106)
Potassium: 4.7 mEq/L (ref 3.5–5.1)
Protein, total: 9 gm/dl — ABNORMAL HIGH (ref 6.4–8.2)
Sodium: 132 mEq/L — ABNORMAL LOW (ref 136–145)

## 2017-08-08 LAB — GLUCOSE, POC
Glucose (POC): 264 mg/dL — ABNORMAL HIGH (ref 65–105)
Glucose (POC): 369 mg/dL — ABNORMAL HIGH (ref 65–105)

## 2017-08-08 MED ORDER — INSULIN GLARGINE 100 UNIT/ML INJECTION
100 unit/mL | Freq: Every evening | SUBCUTANEOUS | Status: DC
Start: 2017-08-08 — End: 2017-08-09
  Administered 2017-08-09: 02:00:00 via SUBCUTANEOUS

## 2017-08-08 MED ORDER — PROMETHAZINE IN NS 12.5 MG/50 ML IV PIGGY BAG
12.5 mg/50 ml | Freq: Four times a day (QID) | INTRAVENOUS | Status: DC | PRN
Start: 2017-08-08 — End: 2017-08-09

## 2017-08-08 MED ORDER — DICYCLOMINE 10 MG CAP
10 mg | Freq: Four times a day (QID) | ORAL | Status: DC | PRN
Start: 2017-08-08 — End: 2017-08-09

## 2017-08-08 MED ORDER — LORAZEPAM 2 MG/ML IJ SOLN
2 mg/mL | Freq: Once | INTRAMUSCULAR | Status: AC
Start: 2017-08-08 — End: 2017-08-08
  Administered 2017-08-08: 14:00:00 via INTRAVENOUS

## 2017-08-08 MED ORDER — ACETAMINOPHEN (TYLENOL) SOLUTION 32MG/ML
ORAL | Status: DC | PRN
Start: 2017-08-08 — End: 2017-08-09

## 2017-08-08 MED ORDER — HALOPERIDOL LACTATE 5 MG/ML IJ SOLN
5 mg/mL | Freq: Once | INTRAMUSCULAR | Status: AC
Start: 2017-08-08 — End: 2017-08-08
  Administered 2017-08-08: 14:00:00 via INTRAVENOUS

## 2017-08-08 MED ORDER — ACETAMINOPHEN 650 MG RECTAL SUPPOSITORY
650 mg | RECTAL | Status: DC | PRN
Start: 2017-08-08 — End: 2017-08-09

## 2017-08-08 MED ORDER — SODIUM CHLORIDE 0.9 % IV
INTRAVENOUS | Status: DC
Start: 2017-08-08 — End: 2017-08-09
  Administered 2017-08-08 – 2017-08-09 (×3): via INTRAVENOUS

## 2017-08-08 MED ORDER — ACETAMINOPHEN 325 MG TABLET
325 mg | ORAL | Status: DC | PRN
Start: 2017-08-08 — End: 2017-08-09

## 2017-08-08 MED ORDER — INSULIN LISPRO 100 UNIT/ML INJECTION
100 unit/mL | Freq: Four times a day (QID) | SUBCUTANEOUS | Status: DC
Start: 2017-08-08 — End: 2017-08-09
  Administered 2017-08-08 – 2017-08-09 (×3): via SUBCUTANEOUS

## 2017-08-08 MED ORDER — HALOPERIDOL LACTATE 5 MG/ML IJ SOLN
5 mg/mL | INTRAMUSCULAR | Status: DC
Start: 2017-08-08 — End: 2017-08-08
  Administered 2017-08-08: 14:00:00 via INTRAMUSCULAR

## 2017-08-08 MED ORDER — LISINOPRIL 20 MG TAB
20 mg | Freq: Every day | ORAL | Status: DC
Start: 2017-08-08 — End: 2017-08-09
  Administered 2017-08-08 – 2017-08-09 (×2): via ORAL

## 2017-08-08 MED ORDER — DEXTROSE 50% IN WATER (D50W) IV
INTRAVENOUS | Status: DC | PRN
Start: 2017-08-08 — End: 2017-08-09

## 2017-08-08 MED ORDER — SODIUM CHLORIDE 0.9 % IV
Freq: Once | INTRAVENOUS | Status: AC
Start: 2017-08-08 — End: 2017-08-08
  Administered 2017-08-08: 19:00:00 via INTRAVENOUS

## 2017-08-08 MED ORDER — LISINOPRIL 20 MG TAB
20 mg | Freq: Two times a day (BID) | ORAL | Status: DC
Start: 2017-08-08 — End: 2017-08-09

## 2017-08-08 MED ORDER — GLUCAGON 1 MG INJECTION
1 mg | INTRAMUSCULAR | Status: DC | PRN
Start: 2017-08-08 — End: 2017-08-09

## 2017-08-08 MED ORDER — SODIUM CHLORIDE 0.9 % INJECTION
20 mg/2 mL | Freq: Two times a day (BID) | INTRAMUSCULAR | Status: DC
Start: 2017-08-08 — End: 2017-08-09
  Administered 2017-08-08 – 2017-08-09 (×3): via INTRAVENOUS

## 2017-08-08 MED ORDER — INSULIN LISPRO 100 UNIT/ML INJECTION
100 unit/mL | SUBCUTANEOUS | Status: DC | PRN
Start: 2017-08-08 — End: 2017-08-09
  Administered 2017-08-08: 22:00:00 via SUBCUTANEOUS

## 2017-08-08 MED ORDER — ONDANSETRON (PF) 4 MG/2 ML INJECTION
4 mg/2 mL | INTRAMUSCULAR | Status: DC | PRN
Start: 2017-08-08 — End: 2017-08-09

## 2017-08-08 MED ORDER — NALOXONE 0.4 MG/ML INJECTION
0.4 mg/mL | INTRAMUSCULAR | Status: DC | PRN
Start: 2017-08-08 — End: 2017-08-09

## 2017-08-08 MED ORDER — DIPHENHYDRAMINE HCL 50 MG/ML IJ SOLN
50 mg/mL | INTRAMUSCULAR | Status: AC
Start: 2017-08-08 — End: 2017-08-08
  Administered 2017-08-08: 14:00:00 via INTRAVENOUS

## 2017-08-08 MED ORDER — HEPARIN (PORCINE) 5,000 UNIT/ML IJ SOLN
5000 unit/mL | Freq: Two times a day (BID) | INTRAMUSCULAR | Status: DC
Start: 2017-08-08 — End: 2017-08-09

## 2017-08-08 MED ORDER — SODIUM CHLORIDE 0.9% BOLUS IV
0.9 % | INTRAVENOUS | Status: AC
Start: 2017-08-08 — End: 2017-08-08
  Administered 2017-08-08: 14:00:00 via INTRAVENOUS

## 2017-08-08 MED FILL — HEPARIN (PORCINE) 5,000 UNIT/ML IJ SOLN: 5000 unit/mL | INTRAMUSCULAR | Qty: 1

## 2017-08-08 MED FILL — DIPHENHYDRAMINE HCL 50 MG/ML IJ SOLN: 50 mg/mL | INTRAMUSCULAR | Qty: 1

## 2017-08-08 MED FILL — LISINOPRIL 20 MG TAB: 20 mg | ORAL | Qty: 1

## 2017-08-08 MED FILL — FAMOTIDINE (PF) 20 MG/2 ML IV: 20 mg/2 mL | INTRAVENOUS | Qty: 2

## 2017-08-08 MED FILL — HALOPERIDOL LACTATE 5 MG/ML IJ SOLN: 5 mg/mL | INTRAMUSCULAR | Qty: 1

## 2017-08-08 MED FILL — LORAZEPAM 2 MG/ML IJ SOLN: 2 mg/mL | INTRAMUSCULAR | Qty: 1

## 2017-08-08 MED FILL — SODIUM CHLORIDE 0.9 % IV: INTRAVENOUS | Qty: 1000

## 2017-08-08 NOTE — Progress Notes (Signed)
 Problem: Falls - Risk of  Goal: *Absence of Falls  Description  Document [name] [name] Fall Risk and appropriate interventions in the flowsheet.  Outcome: Progressing Towards Goal

## 2017-08-08 NOTE — ED Notes (Signed)
Arrived to ED via EMS in wheelchair from home with c/o LLQ abd pain and hx of gastric stimulator placed 3 years ago.

## 2017-08-08 NOTE — H&P (Signed)
H&P by Jonnie Kind, DO at 08/08/17 1342                Author: Jonnie Kind, DO  Service: Hospitalist  Author Type: Physician       Filed: 08/08/17 1405  Date of Service: 08/08/17 1342  Status: Signed          Editor: Jonnie Kind, DO (Physician)                          Admission History and Physical   Jonnie Kind D.O.           Patient: [name] [name]  Age: 32 y.o.  Sex: female          Date of Birth: Jan 09, 1986  Admit Date: 08/08/2017  Admit Doctor: No admitting provider for patient encounter.         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   Hypertension, uncontrolled   Marijuana abuse      Plan:   -Admit to medical floor for observation   -Continue IV fluids with normal saline   -PRN antiemetics   -Add Pepcid twice daily   -Monitor mental status is patient received high doses of Haldol Ativan and Benadryl in the emergency room is very sleepy at time of admission-   -monitor CBC electrolytes as well as trend glucose   -Glucomander protocol   -Resume home lisinopril      Diet: Clear liquids, advance as tolerated   DVT prophylaxis: Heparin      DISPO   -Pt to be admitted  at this time for reasons addressed above, continued hospitalization for ongoing assessment and treatment indicated       Anticipated Date of Discharge: 08/09/2017   Anticipated Disposition (home, SNF) : Home      Chief Complaint:      Chief Complaint       Patient presents with        ?  Abdominal Pain                HPI:     [name] [name] is a 32 y.o.  year old female past medical history of insulin-dependent diabetes type 1, hypertension, gastroparesis status post gastric stimulator placed  at [facility], chronic marijuana abuse, hypertension presents from home today with concern  of nausea and vomiting that is not been controlled on her home medications of Zofran.  Patient states she was actually seen in a different emergency room earlier this  morning at [facility] and had CT abdomen pelvis, was told she had a stomach bug and was discharged home.  Has continued to vomit and not been able to keep anything down.  Has not taken her blood pressure medication today or taken her insulin today.  She  states she uses an insulin pump.  Reports that she last month marijuana 2 to 3 weeks ago and does not use it daily.  Denies any diarrhea at this time.  No chest pain, headaches present.  Vomiting does not have any blood or dark streaks in it.      ER course: Patient had active retching and vomiting while in  the emergency room.  Was given a total of 1 L normal saline, Haldol 2 mg, Ativan  1 mg, diphenhydramine 50 mg and was ordered 20 mill grams of her oral lisinopril though she has not yet taken it.  Work significant for white blood cell elevated 17.9, hemoglobin 12.8 apparent at patient's baseline, platelets 256.  Urinalysis does not  reveal sign of infection but was showing greater than 500 glucose and ketones.  Metabolic work-up shows slightly low sodium 132 with potassium 4.7, creatinine 1.2, glucose 297.  Alk phos slightly elevated 120 but AST and ALT within normal limits as is  lipase.  Patient's CAT scan dated today from outside facility reviewed showing possible gastroneuritis but no signs for obstruction and gastric pacer is in place.  Unfortunately patient is limited historian at time of my exam secondary to all of the medication  she has received in the emergency room but overall reports marked improvement in her nausea and vomiting.      Only medications that patient has at bedside are an old prescription for Bactrim, her lisinopril 20 mg tablets.      Review of Systems -    Review of Systems    Constitutional: Positive for malaise/fatigue. Negative for chills and fever.    HENT: Negative  for congestion, ear pain, sinus pain and sore throat.     Eyes: Negative for blurred vision and photophobia.    Respiratory: Negative for cough, shortness of breath and wheezing.     Cardiovascular: Negative for chest pain, palpitations and leg swelling.    Gastrointestinal: Positive for abdominal pain, heartburn , nausea and vomiting.  Negative for constipation and diarrhea.    Genitourinary: Negative for dysuria and flank pain.    Musculoskeletal: Negative for myalgias.    Skin: Negative for itching and rash.    Neurological: Negative for dizziness, weakness and headaches.    Endo/Heme/Allergies: Negative for polydipsia.    Psychiatric/Behavioral: Negative for depression. The patient has insomnia. The patient is not nervous/anxious.              Past Medical History:     Past Medical History:        Diagnosis  Date         ?  Diabetes ([place])       ?  Drug-seeking behavior       ?  Gastroparesis       ?  Gastroparesis           ?  Hypertension             Past Surgical History:     Past Surgical History:         Procedure  Laterality  Date          ?  ABDOMEN SURGERY PROC UNLISTED    05/2015          Gastric stimulator pump           ?  HX CESAREAN SECTION               Family History:     Family History         Problem  Relation  Age of Onset          ?  Diabetes  Father       ?  Stroke  Other                great grandmother  Social History:     Social History          Socioeconomic History         ?  Marital status:  SINGLE              Spouse name:  Not on file         ?  Number of children:  Not on file     ?  Years of education:  Not on file     ?  Highest education level:  Not on file       Tobacco Use         ?  Smoking status:  Never Smoker     ?  Smokeless tobacco:  Never Used       Substance and Sexual Activity         ?  Alcohol use:  No     ?  Drug use:  Yes              Types:  Marijuana         ?  Sexual activity:  Yes              Birth control/protection:  Injection           Home  Medications:     Prior to Admission medications             Medication  Sig  Start Date  End Date  Taking?  Authorizing Provider            insulin aspart U-100 (NOVOLOG FLEXPEN U-100 INSULIN) 100 unit/mL (3 mL) inpn  by SubCUTAneous route Before breakfast, lunch, and dinner. Indications: sliding scale      Yes  Other, Phys, MD     insulin glargine (LANTUS,BASAGLAR) 100 unit/mL (3 mL) inpn  28 Units by SubCUTAneous route daily (with lunch).      Yes  Other, Phys, MD            lisinopril (PRINIVIL, ZESTRIL) 20 mg tablet  Take 20 mg by mouth two (2) times a day.      Yes  Other, Phys, MD           Allergies:     Allergies        Allergen  Reactions         ?  Morphine  Itching             Pt reports itching         ?  Robaxin [Methocarbamol]  Other (comments)             Body shaking, nausea         ?  Penicillins  Hives         ?  Zithromax [Azithromycin]  Hives           Code Status: FULL      MPOA: Mother Verdene Lennert [phone]        Physical Exam:        Visit Vitals      BP  132/72     Pulse  (!) 106     Temp  98.5 ??F (36.9 ??C)     Resp  18     Ht  '5\' 6"'  (1.676 m)     Wt  50.8 kg (112 lb)     SpO2  100%        BMI  18.08 kg/m??  Physical Exam    Constitutional: She is oriented to person, place, and time. She appears malnourished and  dehydrated. She appears cachectic. She has a  sickly appearance. No distress.   Patient very sleepy, requires frequent stimulant to participate in conversation     HENT:    Head: Normocephalic and atraumatic.   Right Ear: External ear normal.   Left Ear: External ear normal.    Nose: Nose normal.    Mouth/Throat: Mucous membranes are dry. Abnormal dentition .    Eyes: Pupils are equal, round, and reactive to light. Conjunctivae and EOM are normal. Right eye exhibits no discharge. Left eye exhibits no discharge.    Neck: Neck supple. No JVD present. No thyromegaly present.    Cardiovascular: Normal rate, regular rhythm, normal heart sounds and intact distal pulses. Exam reveals no  friction rub.    No murmur heard.   Pulmonary/Chest: Effort normal and breath sounds normal. No respiratory distress. She has no wheezes.    Abdominal: Soft. Bowel sounds are normal. She exhibits no distension. There is no tenderness. There is no rebound and no guarding.   Musculoskeletal: Normal range of motion. She exhibits no edema.   Neurological: She is alert and oriented to person, place, and time. She has normal reflexes. No cranial nerve deficit. Coordination normal.    Skin: Skin is warm and dry. She is not diaphoretic.   Psychiatric: Mood, memory, affect and judgment normal.          Intake and Output:   Current Shift:  No intake/output data recorded.   Last three shifts:  No intake/output data recorded.      Lab/Data Reviewed:     Recent Results (from the past 24 hour(s))     POC URINE MACROSCOPIC          Collection Time: 08/08/17  9:07 AM         Result  Value  Ref Range            Glucose  500 (A)  NEGATIVE,Negative mg/dl       Bilirubin  Negative  NEGATIVE,Negative         Ketone  >=160 (A)  NEGATIVE,Negative mg/dl       Specific gravity  1.025  1.005 - 1.030         Blood  Trace-lysed (A)  NEGATIVE,Negative         pH (UA)  6.0  5 - 9         Protein  >=300 (A)  NEGATIVE,Negative mg/dl       Urobilinogen  0.2  0.0 - 1.0 EU/dl       Nitrites  Negative  NEGATIVE,Negative         Leukocyte Esterase  Negative  NEGATIVE,Negative         Color  Yellow          Appearance  Clear          POC HCG,URINE          Collection Time: 08/08/17  9:09 AM         Result  Value  Ref Range            HCG urine, QL  negative  NEGATIVE,Negative,negative         CBC WITH AUTOMATED DIFF          Collection Time: 08/08/17  9:28 AM         Result  Value  Ref Range  WBC  17.9 (H)  4.0 - 11.0 1000/mm3       RBC  4.70  3.60 - 5.20 M/uL       HGB  12.8 (L)  13.0 - 17.2 gm/dl       HCT  39.6  37.0 - 50.0 %       MCV  84.3  80.0 - 98.0 fL       MCH  27.2  25.4 - 34.6 pg       MCHC  32.3  30.0 - 36.0 gm/dl       PLATELET   256  140 - 450 1000/mm3       MPV  12.8 (H)  6.0 - 10.0 fL       RDW-SD  41.5  36.4 - 46.3         NRBC  0  0 - 0         IMMATURE GRANULOCYTES  0.4  0.0 - 3.0 %       NEUTROPHILS  89.8 (H)  34 - 64 %       LYMPHOCYTES  8.5 (L)  28 - 48 %       MONOCYTES  1.1  1 - 13 %       EOSINOPHILS  0.0  0 - 5 %       BASOPHILS  0.2  0 - 3 %       LIPASE          Collection Time: 08/08/17  9:28 AM         Result  Value  Ref Range            Lipase  38 (L)  73 - 393 U/L       METABOLIC PANEL, COMPREHENSIVE          Collection Time: 08/08/17  9:28 AM         Result  Value  Ref Range            Sodium  132 (L)  136 - 145 mEq/L       Potassium  4.7  3.5 - 5.1 mEq/L       Chloride  101  98 - 107 mEq/L       CO2  19 (L)  21 - 32 mEq/L       Glucose  297 (H)  74 - 106 mg/dl       BUN  21  7 - 25 mg/dl       Creatinine  1.2  0.6 - 1.3 mg/dl       GFR est AA  >60.0          GFR est non-AA  56          Calcium  9.4  8.5 - 10.1 mg/dl       AST (SGOT)  36  15 - 37 U/L       ALT (SGPT)  20  12 - 78 U/L       Alk. phosphatase  128 (H)  45 - 117 U/L       Bilirubin, total  1.0  0.2 - 1.0 mg/dl       Protein, total  9.0 (H)  6.4 - 8.2 gm/dl       Albumin  4.4  3.4 - 5.0 gm/dl       Anion gap  12  5 - 15 mmol/L       GLUCOSE, POC  Collection Time: 08/08/17 10:01 AM         Result  Value  Ref Range            Glucose (POC)  264 (H)  65 - 105 mg/dL       METABOLIC PANEL, BASIC          Collection Time: 08/08/17 11:42 AM         Result  Value  Ref Range            Sodium  136  136 - 145 mEq/L       Potassium  4.2  3.5 - 5.1 mEq/L       Chloride  109 (H)  98 - 107 mEq/L       CO2  18 (L)  21 - 32 mEq/L       Glucose  296 (H)  74 - 106 mg/dl       BUN  22  7 - 25 mg/dl       Creatinine  1.0  0.6 - 1.3 mg/dl       GFR est AA  >60.0          GFR est non-AA  >60          Calcium  6.9 (L)  8.5 - 10.1 mg/dl            Anion gap  9  5 - 15 mmol/L                   Other Result Information       Interface, Powerscribe Rad Res - 08/08/2017  2:11 AM  EDT  EXAM: CT ABDOMEN AND PELVIS WITH CONTRAST    CLINICAL INDICATION/HISTORY: Abd pain,  acute, generalized . Left lower quadrant pain.     COMPARISON: April 20, 2016    TECHNIQUE:  CT abdomen and pelvis with 100 cc of Omnipaque 350 IV contrast.  All CT scans at this facility are performed using dose optimization technique  as appropriate to the performed examination, to include automated exposure control, adjustment of the mA and/or kV according to patient's size (including appropriate matching for site-specific examinations), or use of an iterative reconstruction technique.     FINDINGS:   Lower chest: Negative.    Liver: Negative.     Biliary: Mildly distended gallbladder.    Pancreas: Negative.    Spleen: Negative.    Adrenal glands: Negative.    Kidneys: Mild right renal pelviectasis.     Bladder and Pelvic Organs: Possible mild bladder wall thickening. Uterus and adnexa are unremarkable for age. Probable corpus luteum in the right ovary noted.    Stomach, Small Bowel and Colon: Moderate colonic stool burden. Prominent fluid and  multiple small bowel loops. Prominent fluid in the stomach. No definite focal bowel wall thickening. Gastric pacemaker.    Lymph nodes: No lymphadenopathy.     Vessels: Unremarkable for age.     Peritoneal Spaces: Small amount of  free fluid in the pelvis.    Body wall: Gastric pacemaker generator pack in the left abdomen.    Bones: Unremarkable for age.    IMPRESSION   Limited study due to the possibility of mesenteric fat.    No definite bowel  obstruction. Gastric pacemaker is in place. Possible mild gastroenteritis with prominent fluid-filled distal small bowel loops. Moderate colonic stool burden.    Small amount of free fluid in the pelvis, nonspecific.    Pelviectasis of the  right  kidney without overt hydronephrosis.    Additional findings as above.    Thank you for enabling [place] to participate in the care of this patient.              Jonnie Kind, DO      August 08, 2017    1:42 PM      Dragon medical dictation software was used for portions of this report.   Unintended voice transcription errors may have occurred.

## 2017-08-08 NOTE — ED Provider Notes (Signed)
ED Provider Notes by Hilton Sinclair, PA-C at 08/08/17 4239                Author: Hilton Sinclair, PA-C  Service: Emergency Medicine  Author Type: Physician Assistant       Filed: 08/08/17 1934  Date of Service: 08/08/17 0936  Status: Attested           Editor: Artemio Aly (Physician Assistant)  Cosigner: Maylon Cos, MD at 08/12/17 1859          Attestation signed by Maylon Cos, MD at 08/12/17 1859          Continued by Dr. Hermina Barters dictation software was used for portions of this report. Unintended errors in transcription may occur.      I interviewed and examined the patient. I discussed with the mid-level provider agree with their evaluation and plan as documented here.      Patient had been medicated so was drowsy but easily arousable. Lungs clear heart regular abdomen soft, no complaint of tenderness.   Labs reviewed showing low CO2 and she is hyperglycemic but gap was 12.   We hydrated her and rechecked BMP co12 is 18 but gap is 9      We reviewed her diagnostics and imaging from the outside facility and if this was very recent did not elect to re-CT today   Due to patient's multiple visits for vomiting concern for dehydration ketones in urine d/w dr Tonye Royalty hospitalist who will admit. She asked me to add on a lactic acid which I did.                                 [facility]   Emergency Department Treatment Report          Patient: [name] [name]  Age: 32 y.o.  Sex: female          Date of Birth: Feb 04, 1985  Admit Date: 08/08/2017  PCP: Pecola Lawless, MD     MRN: [id number]   CSN: [id number]   Attending: Maylon Cos, MD         Room: 4230/4230  Time Dictated: 9:36 AM  APP: Hilton Sinclair, PA-C        Chief Complaint      Chief Complaint       Patient presents with        ?  Abdominal Pain              History of Present Illness     32 y.o. female  who presents to the ED with left sided abdominal pain, nausea and vomiting that  started yesterday morning.  PMH gastroparesis and has gastric stimulator in situ.  Last BM was today and was loose; denies diarrhea.  Pt states she has had 9 episodes of  vomiting since yesterday.  Describes abdominal pain as twisting and squeezing in quality w/o radiation. Was seen at [facility] ED for same thing.  States when she got home she passed out.  Pt states pain is 9/10 in severity.          Review of Systems     Constitutional: Positive for malaise/fatigue . Negative for chills and fever.    HENT: Negative for congestion, ear pain, sinus pain and sore throat.     Eyes: Negative  for blurred vision and photophobia.    Respiratory: Negative for cough, shortness of breath and wheezing.     Cardiovascular: Negative for chest pain, palpitations and leg swelling.    Gastrointestinal: Positive for abdominal pain, heartburn , nausea and vomiting. Negative  for constipation and diarrhea.    Genitourinary: Negative for dysuria and flank pain.    Musculoskeletal: Negative for myalgias.    Skin: Negative for itching and rash.    Neurological: Negative for dizziness, weakness and headaches.    Endo/Heme/Allergies: Negative for polydipsia.    Psychiatric/Behavioral: Negative for depression. The patient has insomnia and reports substance abuse. The patient is not nervous/anxious.          Past Medical/Surgical History          Past Medical History:        Diagnosis  Date         ?  Cyclical vomiting  8/56/3149     ?  Diabetes ([place])       ?  Diabetic ketoacidosis without coma associated with type 1 diabetes mellitus ([place])  11/05/2015     ?  Drug-seeking behavior       ?  Essential hypertension  03/27/2016          Amlodipine, carvedilol, lisinopril.         ?  Gastroparesis       ?  Gastroparesis       ?  Hypertension       ?  Insulin long-term use ([place])  11/20/2016     ?  Slow transit constipation  08/08/2017     ?  Type 1 diabetes mellitus ([place])  02/17/2009          Diagnosed at age 47. Had insulin pump at one  time, but developed keloids at needle insertion sites. Was supposed to establish with [facility].             Past Surgical History:         Procedure  Laterality  Date          ?  ABDOMEN SURGERY PROC UNLISTED    05/2015          Gastric stimulator pump           ?  HX CESAREAN SECTION                 Social History          Social History          Socioeconomic History         ?  Marital status:  SINGLE              Spouse name:  Not on file         ?  Number of children:  Not on file     ?  Years of education:  Not on file     ?  Highest education level:  Not on file       Tobacco Use         ?  Smoking status:  Never Smoker     ?  Smokeless tobacco:  Never Used       Substance and Sexual Activity         ?  Alcohol use:  No     ?  Drug use:  Yes              Types:  Marijuana         ?  Sexual activity:  Yes              Birth control/protection:  Injection             Family History          Family History         Problem  Relation  Age of Onset          ?  Diabetes  Father       ?  Stroke  Other                great grandmother             Current Medications          Prior to Admission Medications     Prescriptions  Last Dose  Informant  Patient Reported?  Taking?      carvedilol (COREG) 12.5 mg tablet      Yes  No      Sig: Take 12.5 mg by mouth daily.      insulin glulisine (APIDRA SOLOSTAR) 100 unit/mL pen      Yes  No      Sig: by SubCUTAneous route Before breakfast, lunch, and dinner. Indications: type 1 diabetes mellitus, pt states this is her sliding scale insulin      lisinopril (PRINIVIL, ZESTRIL) 20 mg tablet      Yes  No      Sig: Take 20 mg by mouth two (2) times a day.      ondansetron hcl (ZOFRAN) 4 mg tablet      No  No      Sig: Take 1 Tab by mouth every eight (8) hours as needed for Nausea.               Facility-Administered Medications: None             Allergies          Allergies        Allergen  Reactions         ?  Morphine  Itching             Pt reports itching         ?   Robaxin [Methocarbamol]  Other (comments)             Body shaking, nausea         ?  Penicillins  Hives         ?  Zithromax [Azithromycin]  Hives             Physical Exam          ED Triage Vitals [08/08/17 0858]     ED Encounter Vitals Group           BP  (!) 185/102        Pulse (Heart Rate)  (!) 110        Resp Rate  16        Temp  99.1 ??F (37.3 ??C)        Temp src          O2 Sat (%)  100 %        Weight  112 lb           Height  5' 6"           Physical Exam    Constitutional: She is oriented to person, place, and time. She appears to be writhing in pain  (Patient  keeps getting up and sitting down and getting up and sitting down during her interview). She appears distressed .   32 yrs old female who is well developed, well-nourished and appears stated age.    HENT:    Head: Normocephalic and atraumatic.   Right Ear: External ear normal.   Left Ear: External ear normal.    Nose: Nose normal.    Mouth/Throat: Oropharynx is clear and moist.    Eyes: Pupils are equal, round, and reactive to light. Conjunctivae and EOM are normal.    Neck: Normal range of motion. Neck supple.    Cardiovascular: Regular rhythm, normal heart sounds and intact distal pulses. Tachycardia present.    Pulmonary/Chest: Effort normal and breath sounds normal. No respiratory distress.    Abdominal: Soft. Bowel sounds are normal. She exhibits no distension. There is tenderness  (generalized).   Musculoskeletal: Normal range of motion. She exhibits no deformity.   Neurological:  She is alert and oriented to person, place, and time. No cranial nerve deficit. GCS score is 15.    Skin: Skin is warm and dry. No rash noted.   Psychiatric: Memory and judgment normal. Her mood appears  anxious. Her affect is labile.    Nursing note and vitals reviewed.           Impression and Management Plan     32 y.o. female  presents with recurrent cyclic vomiting which is likely secondary to gastroparesis or marijuana hyperemesis.  We will place an IV and  recheck labs and compare them to the labs that were drawn yesterday at [place].  We will go ahead and give  her Haldol, Ativan and Benadryl which appears to be what is what she is usually given and seems to work.  We will also get a point-of-care glucose and start her on some fluids.  We will also check a urinalysis.  At this time, I do not see any purpose  in checking a urine drug screen as she had one done yesterday.        Diagnostic Studies     Lab:      Recent Results (from the past 12 hour(s))     POC URINE MACROSCOPIC          Collection Time: 08/08/17  9:07 AM         Result  Value  Ref Range            Glucose  500 (A)  NEGATIVE,Negative mg/dl       Bilirubin  Negative  NEGATIVE,Negative         Ketone  >=160 (A)  NEGATIVE,Negative mg/dl       Specific gravity  1.025  1.005 - 1.030         Blood  Trace-lysed (A)  NEGATIVE,Negative         pH (UA)  6.0  5 - 9         Protein  >=300 (A)  NEGATIVE,Negative mg/dl       Urobilinogen  0.2  0.0 - 1.0 EU/dl       Nitrites  Negative  NEGATIVE,Negative         Leukocyte Esterase  Negative  NEGATIVE,Negative         Color  Yellow          Appearance  Clear          POC HCG,URINE          Collection Time: 08/08/17  9:09 AM  Result  Value  Ref Range            HCG urine, QL  negative  NEGATIVE,Negative,negative         CBC WITH AUTOMATED DIFF          Collection Time: 08/08/17  9:28 AM         Result  Value  Ref Range            WBC  17.9 (H)  4.0 - 11.0 1000/mm3       RBC  4.70  3.60 - 5.20 M/uL       HGB  12.8 (L)  13.0 - 17.2 gm/dl       HCT  39.6  37.0 - 50.0 %       MCV  84.3  80.0 - 98.0 fL       MCH  27.2  25.4 - 34.6 pg       MCHC  32.3  30.0 - 36.0 gm/dl       PLATELET  256  140 - 450 1000/mm3       MPV  12.8 (H)  6.0 - 10.0 fL       RDW-SD  41.5  36.4 - 46.3         NRBC  0  0 - 0         IMMATURE GRANULOCYTES  0.4  0.0 - 3.0 %       NEUTROPHILS  89.8 (H)  34 - 64 %       LYMPHOCYTES  8.5 (L)  28 - 48 %       MONOCYTES  1.1  1 - 13 %        EOSINOPHILS  0.0  0 - 5 %       BASOPHILS  0.2  0 - 3 %       LIPASE          Collection Time: 08/08/17  9:28 AM         Result  Value  Ref Range            Lipase  38 (L)  73 - 393 U/L       METABOLIC PANEL, COMPREHENSIVE          Collection Time: 08/08/17  9:28 AM         Result  Value  Ref Range            Sodium  132 (L)  136 - 145 mEq/L       Potassium  4.7  3.5 - 5.1 mEq/L       Chloride  101  98 - 107 mEq/L       CO2  19 (L)  21 - 32 mEq/L       Glucose  297 (H)  74 - 106 mg/dl       BUN  21  7 - 25 mg/dl       Creatinine  1.2  0.6 - 1.3 mg/dl       GFR est AA  >60.0          GFR est non-AA  56          Calcium  9.4  8.5 - 10.1 mg/dl       AST (SGOT)  36  15 - 37 U/L       ALT (SGPT)  20  12 - 78 U/L       Alk. phosphatase  128 (H)  45 - 117 U/L       Bilirubin, total  1.0  0.2 - 1.0 mg/dl       Protein, total  9.0 (H)  6.4 - 8.2 gm/dl       Albumin  4.4  3.4 - 5.0 gm/dl       Anion gap  12  5 - 15 mmol/L       HEMOGLOBIN A1C W/O EAG          Collection Time: 08/08/17  9:28 AM         Result  Value  Ref Range            Hemoglobin A1c  9.1 (H)  4.2 - 6.3 %       GLUCOSE, POC          Collection Time: 08/08/17 10:01 AM         Result  Value  Ref Range            Glucose (POC)  264 (H)  65 - 105 mg/dL       METABOLIC PANEL, BASIC          Collection Time: 08/08/17 11:42 AM         Result  Value  Ref Range            Sodium  136  136 - 145 mEq/L       Potassium  4.2  3.5 - 5.1 mEq/L       Chloride  109 (H)  98 - 107 mEq/L       CO2  18 (L)  21 - 32 mEq/L       Glucose  296 (H)  74 - 106 mg/dl       BUN  22  7 - 25 mg/dl       Creatinine  1.0  0.6 - 1.3 mg/dl       GFR est AA  >60.0          GFR est non-AA  >60          Calcium  6.9 (L)  8.5 - 10.1 mg/dl       Anion gap  9  5 - 15 mmol/L       LACTIC ACID          Collection Time: 08/08/17  2:23 PM         Result  Value  Ref Range            Lactic Acid  1.8  0.4 - 2.0 mmol/L       GLUCOSE, POC          Collection Time: 08/08/17  5:17 PM         Result  Value   Ref Range            Glucose (POC)  369 (H)  65 - 105 mg/dL       DRUG SCREEN, URINE          Collection Time: 08/08/17  6:15 PM         Result  Value  Ref Range            Amphetamine  NEGATIVE  NEGATIVE         Barbiturates  NEGATIVE  NEGATIVE         Benzodiazepines  NEGATIVE  NEGATIVE         Cocaine  NEGATIVE  NEGATIVE         Marijuana  POSITIVE (A)  NEGATIVE         Methadone  NEGATIVE  NEGATIVE         Opiates  NEGATIVE  NEGATIVE              Phencyclidine  NEGATIVE  NEGATIVE            ED Course        Patient Vitals for the past 12 hrs:            Temp  Pulse  Resp  BP  SpO2            08/08/17 1505  98.5 ??F (36.9 ??C)  (!) 111  18  112/50  99 %            08/08/17 1402  98.6 ??F (37 ??C)  --  16  127/63  100 %     08/08/17 1301  --  --  --  132/72  100 %     08/08/17 1223  98.5 ??F (36.9 ??C)  (!) 106  18  142/78  100 %     08/08/17 1122  --  --  --  --  100 %     08/08/17 1038  98.6 ??F (37 ??C)  (!) 113  20  146/86  100 %     08/08/17 1037  --  --  --  --  99 %            08/08/17 0858  99.1 ??F (37.3 ??C)  (!) 110  16  (!) 185/102  100 %             ED Course as of Aug 08 1932       Sat Aug 08, 2017        1230  CO2(!): 18 [SH]     1230  CO2(!): 19 [SH]     1230  CO2(!): 18 [SH]     1230  Anion gap: 9 [SH]     1230  Anion gap: 12 [SH]              ED Course User Index   [SH] Hilton Sinclair, PA-C             Medications       lisinopril (PRINIVIL, ZESTRIL) tablet 20 mg (20 mg Oral Given 08/08/17 1010)     lisinopril (PRINIVIL, ZESTRIL) tablet 20 mg (has no administration in time range)     acetaminophen (TYLENOL) tablet 650 mg (has no administration in time range)       Or     acetaminophen (TYLENOL) solution 650 mg (has no administration in time range)       Or     acetaminophen (TYLENOL) suppository 650 mg (has no administration in time range)     naloxone (NARCAN) injection 0.1 mg (has no administration in time range)     0.9% sodium chloride infusion (125 mL/hr IntraVENous New Bag 08/08/17 1530)      dicyclomine (BENTYL) capsule 10 mg (has no administration in time range)     ondansetron (ZOFRAN) injection 4 mg (has no administration in time range)     promethazine (PHENERGAN) 12.5 mg in NS 50 mL IVPB (has no administration in time range)     heparin (porcine) injection 5,000 Units (5,000 Units SubCUTAneous Refused 08/08/17 1600)     dextrose (D50) infusion 5-25 g (has no administration in time range)       glucagon (GLUCAGEN) injection  1 mg (has no administration in time range)       insulin glargine (LANTUS) injection 1-100 Units (has no administration in time range)     insulin lispro (HUMALOG) injection 1-100 Units (5 Units SubCUTAneous Given 08/08/17 1815)     insulin lispro (HUMALOG) injection 1-100 Units (4 Units SubCUTAneous Given 08/08/17 1815)     famotidine (PF) (PEPCID) 20 mg in sodium chloride 0.9% 10 mL injection (20 mg IntraVENous Given 08/08/17 1436)     sodium chloride 0.9 % bolus infusion 1,000 mL (0 mL IntraVENous IV Completed 08/08/17 1414)     haloperidol lactate (HALDOL) injection 2 mg (2 mg IntraVENous Given 08/08/17 1010)     LORazepam (ATIVAN) injection 1 mg (1 mg IntraVENous Given 08/08/17 1010)     diphenhydrAMINE (BENADRYL) injection 50 mg (50 mg IntraVENous Given 08/08/17 1010)       0.9% sodium chloride infusion (125 mL/hr IntraVENous New Bag 08/08/17 1436)           Procedures     Medical Decision Making     Patient had presented with intractable nausea and vomiting which I suspect is a gastroparesis flare.  She also was noted to have a bit of a anion gap on her labs.  When we repeated  her labs she narrowed her gap but her CO2 increased which was rather odd.  Concerned that she is starting to go into DKA.  We have discussed with the hospitalist who is agreed to admit the patient for observation and hydration and to work on getting her  blood sugar under better control.        Final Diagnosis                 ICD-10-CM  ICD-9-CM          1.  Intractable cyclical vomiting with nausea  G43.A1   536.2     2.  Diabetic gastroparesis (HCC)  E11.43  250.60           K31.84  536.3          3.  Slow transit constipation  K59.01  564.01     4.  Marijuana use  F12.90  305.20     5.  Insulin long-term use (HCC)  Z79.4  V58.67     6.  Leukocytosis, unspecified typeChronic  D72.829  288.60          7.  Essential hypertension  I10  401.9             Disposition     Admit to hospital      Hilton Sinclair, MPA, PA-C   August 08, 2017   9:36 AM      The patient was personally evaluated by myself and [facility], Jeanine Luz, MD who agrees with the above assessment and plan.      My signature above authenticates this document and my orders, the final diagnosis(es), discharge prescription (s), and instructions in the Epic record. If you have any questions please contact [phone].       Nursing notes have been reviewed by the Physician/Advanced Practice Clinician. Dragon medical dictation software was used for portions of this report. Unintended voice recognition errors may occur.

## 2017-08-08 NOTE — H&P (Signed)
Admission History and Physical  Jonnie Kind D.O.     Patient: [name] [name] Age: 32 y.o. Sex: female    Date of Birth: 28-Oct-1985 Admit Date: 08/08/2017 Admit Doctor: No admitting provider for patient encounter.   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  Hypertension, uncontrolled  Marijuana abuse    Plan:  -Admit to medical floor for observation  -Continue IV fluids with normal saline  -PRN antiemetics  -Add Pepcid twice daily  -Monitor mental status is patient received high doses of Haldol Ativan and Benadryl in the emergency room is very sleepy at time of admission-  -monitor CBC electrolytes as well as trend glucose  -Glucomander protocol  -Resume home lisinopril    Diet: Clear liquids, advance as tolerated  DVT prophylaxis: Heparin    DISPO  -Pt to be admitted  at this time for reasons addressed above, continued hospitalization for ongoing assessment and treatment indicated     Anticipated Date of Discharge: 08/09/2017  Anticipated Disposition (home, SNF) : Home    Chief Complaint:   Chief Complaint   Patient presents with   ??? Abdominal Pain         HPI:   [name] [name] is a 32 y.o. year old female past medical history of insulin-dependent diabetes type 1, hypertension, gastroparesis status post gastric stimulator placed at [facility], chronic marijuana abuse, hypertension presents from home today with concern of nausea and vomiting that is not been controlled on her home medications of Zofran.  Patient states she was actually seen in a different emergency room earlier this morning at [facility] and had CT abdomen pelvis, was told she had a stomach bug and was discharged home.  Has continued to vomit and not been able to keep  anything down.  Has not taken her blood pressure medication today or taken her insulin today.  She states she uses an insulin pump.  Reports that she last month marijuana 2 to 3 weeks ago and does not use it daily.  Denies any diarrhea at this time.  No chest pain, headaches present.  Vomiting does not have any blood or dark streaks in it.    ER course: Patient had active retching and vomiting while in the emergency room.  Was given a total of 1 L normal saline, Haldol 2 mg, Ativan 1 mg, diphenhydramine 50 mg and was ordered 20 mill grams of her oral lisinopril though she has not yet taken it.  Work significant for white blood cell elevated 17.9, hemoglobin 12.8 apparent at patient's baseline, platelets 256.  Urinalysis does not reveal sign of infection but was showing greater than 500 glucose and ketones.  Metabolic work-up shows slightly low sodium 132 with potassium 4.7, creatinine 1.2, glucose 297.  Alk phos slightly elevated 120 but AST and ALT within normal limits as is lipase.  Patient's CAT scan dated today from outside facility reviewed showing possible gastroneuritis but no signs for obstruction and gastric pacer is in place.  Unfortunately patient is limited historian at time of my exam secondary to all of the medication she has received in the emergency room but overall reports marked improvement in her nausea and vomiting.    Only medications that patient has at bedside are an old prescription for Bactrim, her  lisinopril 20 mg tablets.    Review of Systems -   Review of Systems   Constitutional: Positive for malaise/fatigue. Negative for chills and fever.   HENT: Negative for congestion, ear pain, sinus pain and sore throat.    Eyes: Negative for blurred vision and photophobia.   Respiratory: Negative for cough, shortness of breath and wheezing.    Cardiovascular: Negative for chest pain, palpitations and leg swelling.   Gastrointestinal: Positive for abdominal pain, heartburn, nausea and  vomiting. Negative for constipation and diarrhea.   Genitourinary: Negative for dysuria and flank pain.   Musculoskeletal: Negative for myalgias.   Skin: Negative for itching and rash.   Neurological: Negative for dizziness, weakness and headaches.   Endo/Heme/Allergies: Negative for polydipsia.   Psychiatric/Behavioral: Negative for depression. The patient has insomnia. The patient is not nervous/anxious.          Past Medical History:  Past Medical History:   Diagnosis Date   ??? Diabetes ([place])    ??? Drug-seeking behavior    ??? Gastroparesis    ??? Gastroparesis    ??? Hypertension        Past Surgical History:  Past Surgical History:   Procedure Laterality Date   ??? ABDOMEN SURGERY PROC UNLISTED  05/2015    Gastric stimulator pump    ??? HX CESAREAN SECTION         Family History:  Family History   Problem Relation Age of Onset   ??? Diabetes Father    ??? Stroke Other         great grandmother       Social History:  Social History     Socioeconomic History   ??? Marital status: SINGLE     Spouse name: Not on file   ??? Number of children: Not on file   ??? Years of education: Not on file   ??? Highest education level: Not on file   Tobacco Use   ??? Smoking status: Never Smoker   ??? Smokeless tobacco: Never Used   Substance and Sexual Activity   ??? Alcohol use: No   ??? Drug use: Yes     Types: Marijuana   ??? Sexual activity: Yes     Birth control/protection: Injection       Home Medications:  Prior to Admission medications    Medication Sig Start Date End Date Taking? Authorizing Provider   insulin aspart U-100 (NOVOLOG FLEXPEN U-100 INSULIN) 100 unit/mL (3 mL) inpn by SubCUTAneous route Before breakfast, lunch, and dinner. Indications: sliding scale   Yes Other, Phys, MD   insulin glargine (LANTUS,BASAGLAR) 100 unit/mL (3 mL) inpn 28 Units by SubCUTAneous route daily (with lunch).   Yes Other, Phys, MD   lisinopril (PRINIVIL, ZESTRIL) 20 mg tablet Take 20 mg by mouth two (2) times a day.   Yes Other, Phys, MD       Allergies:  Allergies    Allergen Reactions   ??? Morphine Itching     Pt reports itching   ??? Robaxin [Methocarbamol] Other (comments)     Body shaking, nausea   ??? Penicillins Hives   ??? Zithromax [Azithromycin] Hives       Code Status: FULL    MPOA: Mother Verdene Lennert [phone]    Physical Exam:     Visit Vitals  BP 132/72   Pulse (!) 106   Temp 98.5 ??F (36.9 ??C)   Resp 18   Ht '5\' 6"'$  (1.676 m)   Wt 50.8 kg (112 lb)  SpO2 100%   BMI 18.08 kg/m??     Physical Exam   Constitutional: She is oriented to person, place, and time. She appears malnourished and dehydrated. She appears cachectic. She has a sickly appearance. No distress.   Patient very sleepy, requires frequent stimulant to participate in conversation   HENT:   Head: Normocephalic and atraumatic.   Right Ear: External ear normal.   Left Ear: External ear normal.   Nose: Nose normal.   Mouth/Throat: Mucous membranes are dry. Abnormal dentition.   Eyes: Pupils are equal, round, and reactive to light. Conjunctivae and EOM are normal. Right eye exhibits no discharge. Left eye exhibits no discharge.   Neck: Neck supple. No JVD present. No thyromegaly present.   Cardiovascular: Normal rate, regular rhythm, normal heart sounds and intact distal pulses. Exam reveals no friction rub.   No murmur heard.  Pulmonary/Chest: Effort normal and breath sounds normal. No respiratory distress. She has no wheezes.   Abdominal: Soft. Bowel sounds are normal. She exhibits no distension. There is no tenderness. There is no rebound and no guarding.   Musculoskeletal: Normal range of motion. She exhibits no edema.   Neurological: She is alert and oriented to person, place, and time. She has normal reflexes. No cranial nerve deficit. Coordination normal.   Skin: Skin is warm and dry. She is not diaphoretic.   Psychiatric: Mood, memory, affect and judgment normal.       Intake and Output:  Current Shift:  No intake/output data recorded.  Last three shifts:  No intake/output data recorded.    Lab/Data Reviewed:   Recent Results (from the past 24 hour(s))   POC URINE MACROSCOPIC    Collection Time: 08/08/17  9:07 AM   Result Value Ref Range    Glucose 500 (A) NEGATIVE,Negative mg/dl    Bilirubin Negative NEGATIVE,Negative      Ketone >=160 (A) NEGATIVE,Negative mg/dl    Specific gravity 1.025 1.005 - 1.030      Blood Trace-lysed (A) NEGATIVE,Negative      pH (UA) 6.0 5 - 9      Protein >=300 (A) NEGATIVE,Negative mg/dl    Urobilinogen 0.2 0.0 - 1.0 EU/dl    Nitrites Negative NEGATIVE,Negative      Leukocyte Esterase Negative NEGATIVE,Negative      Color Yellow      Appearance Clear     POC HCG,URINE    Collection Time: 08/08/17  9:09 AM   Result Value Ref Range    HCG urine, QL negative NEGATIVE,Negative,negative     CBC WITH AUTOMATED DIFF    Collection Time: 08/08/17  9:28 AM   Result Value Ref Range    WBC 17.9 (H) 4.0 - 11.0 1000/mm3    RBC 4.70 3.60 - 5.20 M/uL    HGB 12.8 (L) 13.0 - 17.2 gm/dl    HCT 39.6 37.0 - 50.0 %    MCV 84.3 80.0 - 98.0 fL    MCH 27.2 25.4 - 34.6 pg    MCHC 32.3 30.0 - 36.0 gm/dl    PLATELET 256 140 - 450 1000/mm3    MPV 12.8 (H) 6.0 - 10.0 fL    RDW-SD 41.5 36.4 - 46.3      NRBC 0 0 - 0      IMMATURE GRANULOCYTES 0.4 0.0 - 3.0 %    NEUTROPHILS 89.8 (H) 34 - 64 %    LYMPHOCYTES 8.5 (L) 28 - 48 %    MONOCYTES 1.1 1 - 13 %  EOSINOPHILS 0.0 0 - 5 %    BASOPHILS 0.2 0 - 3 %   LIPASE    Collection Time: 08/08/17  9:28 AM   Result Value Ref Range    Lipase 38 (L) 73 - 259 U/L   METABOLIC PANEL, COMPREHENSIVE    Collection Time: 08/08/17  9:28 AM   Result Value Ref Range    Sodium 132 (L) 136 - 145 mEq/L    Potassium 4.7 3.5 - 5.1 mEq/L    Chloride 101 98 - 107 mEq/L    CO2 19 (L) 21 - 32 mEq/L    Glucose 297 (H) 74 - 106 mg/dl    BUN 21 7 - 25 mg/dl    Creatinine 1.2 0.6 - 1.3 mg/dl    GFR est AA >60.0      GFR est non-AA 56      Calcium 9.4 8.5 - 10.1 mg/dl    AST (SGOT) 36 15 - 37 U/L    ALT (SGPT) 20 12 - 78 U/L    Alk. phosphatase 128 (H) 45 - 117 U/L    Bilirubin, total 1.0 0.2 - 1.0 mg/dl     Protein, total 9.0 (H) 6.4 - 8.2 gm/dl    Albumin 4.4 3.4 - 5.0 gm/dl    Anion gap 12 5 - 15 mmol/L   GLUCOSE, POC    Collection Time: 08/08/17 10:01 AM   Result Value Ref Range    Glucose (POC) 264 (H) 65 - 563 mg/dL   METABOLIC PANEL, BASIC    Collection Time: 08/08/17 11:42 AM   Result Value Ref Range    Sodium 136 136 - 145 mEq/L    Potassium 4.2 3.5 - 5.1 mEq/L    Chloride 109 (H) 98 - 107 mEq/L    CO2 18 (L) 21 - 32 mEq/L    Glucose 296 (H) 74 - 106 mg/dl    BUN 22 7 - 25 mg/dl    Creatinine 1.0 0.6 - 1.3 mg/dl    GFR est AA >60.0      GFR est non-AA >60      Calcium 6.9 (L) 8.5 - 10.1 mg/dl    Anion gap 9 5 - 15 mmol/L           Other Result Information   Interface, Powers[employer] Rad Res - 08/08/2017  2:11 AM EDT  EXAM: CT ABDOMEN AND PELVIS WITH CONTRAST    CLINICAL INDICATION/HISTORY: Abd pain, acute, generalized . Left lower quadrant pain.     COMPARISON: April 20, 2016    TECHNIQUE:  CT abdomen and pelvis with 100 cc of Omnipaque 350 IV contrast.  All CT scans at this facility are performed using dose optimization technique as appropriate to the performed examination, to include automated exposure control, adjustment of the mA and/or kV according to patient's size (including appropriate matching for site-specific examinations), or use of an iterative reconstruction technique.    FINDINGS:   Lower chest: Negative.    Liver: Negative.     Biliary: Mildly distended gallbladder.    Pancreas: Negative.    Spleen: Negative.    Adrenal glands: Negative.    Kidneys: Mild right renal pelviectasis.    Bladder and Pelvic Organs: Possible mild bladder wall thickening. Uterus and adnexa are unremarkable for age. Probable corpus luteum in the right ovary noted.    Stomach, Small Bowel and Colon: Moderate colonic stool burden. Prominent fluid and multiple small bowel loops. Prominent fluid in the stomach. No definite focal bowel  wall thickening. Gastric pacemaker.    Lymph nodes: No lymphadenopathy.      Vessels: Unremarkable for age.     Peritoneal Spaces: Small amount of free fluid in the pelvis.    Body wall: Gastric pacemaker generator pack in the left abdomen.    Bones: Unremarkable for age.    IMPRESSION   Limited study due to the possibility of mesenteric fat.    No definite bowel obstruction. Gastric pacemaker is in place. Possible mild gastroenteritis with prominent fluid-filled distal small bowel loops. Moderate colonic stool burden.    Small amount of free fluid in the pelvis, nonspecific.    Pelviectasis of the right kidney without overt hydronephrosis.    Additional findings as above.    Thank you for enabling [place] to participate in the care of this patient.         Jonnie Kind, DO    August 08, 2017  1:42 PM    Dragon medical dictation software was used for portions of this report.  Unintended voice transcription errors may have occurred.

## 2017-08-08 NOTE — Progress Notes (Signed)
Problem: Falls - Risk of  Goal: *Absence of Falls  Description  Document Schmid Fall Risk and appropriate interventions in the flowsheet.  Outcome: Progressing Towards Goal

## 2017-08-08 NOTE — Other (Signed)
TRANSFER - OUT REPORT:    [name] FlakeDevale S [name]  being transferred to 4230(unit) for routine progression of care       Report consisted of patient???s Situation, Background, Assessment and   Recommendations(SBAR).     Information from the following report(s) SBAR, ED Summary, Procedure Summary, MAR, Recent Results and Quality Measures was reviewed with the receiving nurse.    Lines:   Peripheral IV 08/08/17 Left Wrist (Active)   Site Assessment Clean, dry, & intact 08/08/2017  9:45 AM   Phlebitis Assessment 0 08/08/2017  9:45 AM   Infiltration Assessment 0 08/08/2017  9:45 AM   Dressing Status Clean, dry, & intact 08/08/2017  9:45 AM   Dressing Type Transparent 08/08/2017  9:45 AM        Opportunity for questions and clarification was provided.      Patient transported with:   [employer]

## 2017-08-08 NOTE — ED Provider Notes (Signed)
[place]  Emergency Department Treatment Report    Patient: [name] [name] Age: 32 y.o. Sex: female    Date of Birth: 03/18/1985 Admit Date: 08/08/2017 PCP: Pecola Lawless, MD   MRN: [id number]  CSN: [id number]  Attending: Maylon Cos, MD   Room: 4230/4230 Time Dictated: 9:36 AM APP: Hilton Sinclair, PA-C     Chief Complaint   Chief Complaint   Patient presents with   ??? Abdominal Pain        History of Present Illness   32 y.o. female who presents to the ED with left sided abdominal pain, nausea and vomiting that started yesterday morning.  PMH gastroparesis and has gastric stimulator in situ.  Last BM was today and was loose; denies diarrhea.  Pt states she has had 9 episodes of vomiting since yesterday.  Describes abdominal pain as twisting and squeezing in quality w/o radiation. Was seen at [facility] ED for same thing.  States when she got home she passed out.  Pt states pain is 9/10 in severity.      Review of Systems   Constitutional: Positive for malaise/fatigue. Negative for chills and fever.   HENT: Negative for congestion, ear pain, sinus pain and sore throat.    Eyes: Negative for blurred vision and photophobia.   Respiratory: Negative for cough, shortness of breath and wheezing.    Cardiovascular: Negative for chest pain, palpitations and leg swelling.   Gastrointestinal: Positive for abdominal pain, heartburn, nausea and vomiting. Negative for constipation and diarrhea.   Genitourinary: Negative for dysuria and flank pain.   Musculoskeletal: Negative for myalgias.   Skin: Negative for itching and rash.   Neurological: Negative for dizziness, weakness and headaches.   Endo/Heme/Allergies: Negative for polydipsia.   Psychiatric/Behavioral: Negative for depression. The patient has insomnia and reports substance abuse. The patient is not nervous/anxious.      Past Medical/Surgical History     Past Medical History:   Diagnosis Date   ??? Cyclical vomiting 8/67/6720    ??? Diabetes ([place])    ??? Diabetic ketoacidosis without coma associated with type 1 diabetes mellitus ([place]) 11/05/2015   ??? Drug-seeking behavior    ??? Essential hypertension 03/27/2016    Amlodipine, carvedilol, lisinopril.   ??? Gastroparesis    ??? Gastroparesis    ??? Hypertension    ??? Insulin long-term use ([place]) 11/20/2016   ??? Slow transit constipation 08/08/2017   ??? Type 1 diabetes mellitus ([place]) 02/17/2009    Diagnosed at age 82. Had insulin pump at one time, but developed keloids at needle insertion sites. Was supposed to establish with [facility].       Past Surgical History:   Procedure Laterality Date   ??? ABDOMEN SURGERY PROC UNLISTED  05/2015    Gastric stimulator pump    ??? HX CESAREAN SECTION         Social History     Social History     Socioeconomic History   ??? Marital status: SINGLE     Spouse name: Not on file   ??? Number of children: Not on file   ??? Years of education: Not on file   ??? Highest education level: Not on file   Tobacco Use   ??? Smoking status: Never Smoker   ??? Smokeless tobacco: Never Used   Substance and Sexual Activity   ??? Alcohol use: No   ??? Drug use: Yes     Types: Marijuana   ??? Sexual activity:  Yes     Birth control/protection: Injection       Family History     Family History   Problem Relation Age of Onset   ??? Diabetes Father    ??? Stroke Other         great grandmother       Current Medications     Prior to Admission Medications   Prescriptions Last Dose Informant Patient Reported? Taking?   carvedilol (COREG) 12.5 mg tablet   Yes No   Sig: Take 12.5 mg by mouth daily.   insulin glulisine (APIDRA SOLOSTAR) 100 unit/mL pen   Yes No   Sig: by SubCUTAneous route Before breakfast, lunch, and dinner. Indications: type 1 diabetes mellitus, pt states this is her sliding scale insulin   lisinopril (PRINIVIL, ZESTRIL) 20 mg tablet   Yes No   Sig: Take 20 mg by mouth two (2) times a day.   ondansetron hcl (ZOFRAN) 4 mg tablet   No No    Sig: Take 1 Tab by mouth every eight (8) hours as needed for Nausea.      Facility-Administered Medications: None       Allergies     Allergies   Allergen Reactions   ??? Morphine Itching     Pt reports itching   ??? Robaxin [Methocarbamol] Other (comments)     Body shaking, nausea   ??? Penicillins Hives   ??? Zithromax [Azithromycin] Hives       Physical Exam     ED Triage Vitals [08/08/17 0858]   ED Encounter Vitals Group      BP (!) 185/102      Pulse (Heart Rate) (!) 110      Resp Rate 16      Temp 99.1 ??F (37.3 ??C)      Temp src       O2 Sat (%) 100 %      Weight 112 lb      Height '5\' 6"'$        Physical Exam   Constitutional: She is oriented to person, place, and time. She appears to be writhing in pain (Patient keeps getting up and sitting down and getting up and sitting down during her interview). She appears distressed.   32 yrs old female who is well developed, well-nourished and appears stated age.   HENT:   Head: Normocephalic and atraumatic.   Right Ear: External ear normal.   Left Ear: External ear normal.   Nose: Nose normal.   Mouth/Throat: Oropharynx is clear and moist.   Eyes: Pupils are equal, round, and reactive to light. Conjunctivae and EOM are normal.   Neck: Normal range of motion. Neck supple.   Cardiovascular: Regular rhythm, normal heart sounds and intact distal pulses. Tachycardia present.   Pulmonary/Chest: Effort normal and breath sounds normal. No respiratory distress.   Abdominal: Soft. Bowel sounds are normal. She exhibits no distension. There is tenderness (generalized).   Musculoskeletal: Normal range of motion. She exhibits no deformity.   Neurological: She is alert and oriented to person, place, and time. No cranial nerve deficit. GCS score is 15.   Skin: Skin is warm and dry. No rash noted.   Psychiatric: Memory and judgment normal. Her mood appears anxious. Her affect is labile.   Nursing note and vitals reviewed.      Impression and Management Plan    32 y.o. female presents with recurrent cyclic vomiting which is likely secondary to gastroparesis or marijuana hyperemesis.  We will  place an IV and recheck labs and compare them to the labs that were drawn yesterday at [facility].  We will go ahead and give her Haldol, Ativan and Benadryl which appears to be what is what she is usually given and seems to work.  We will also get a point-of-care glucose and start her on some fluids.  We will also check a urinalysis.  At this time, I do not see any purpose in checking a urine drug screen as she had one done yesterday.    Diagnostic Studies   Lab:   Recent Results (from the past 12 hour(s))   POC URINE MACROSCOPIC    Collection Time: 08/08/17  9:07 AM   Result Value Ref Range    Glucose 500 (A) NEGATIVE,Negative mg/dl    Bilirubin Negative NEGATIVE,Negative      Ketone >=160 (A) NEGATIVE,Negative mg/dl    Specific gravity 1.025 1.005 - 1.030      Blood Trace-lysed (A) NEGATIVE,Negative      pH (UA) 6.0 5 - 9      Protein >=300 (A) NEGATIVE,Negative mg/dl    Urobilinogen 0.2 0.0 - 1.0 EU/dl    Nitrites Negative NEGATIVE,Negative      Leukocyte Esterase Negative NEGATIVE,Negative      Color Yellow      Appearance Clear     POC HCG,URINE    Collection Time: 08/08/17  9:09 AM   Result Value Ref Range    HCG urine, QL negative NEGATIVE,Negative,negative     CBC WITH AUTOMATED DIFF    Collection Time: 08/08/17  9:28 AM   Result Value Ref Range    WBC 17.9 (H) 4.0 - 11.0 1000/mm3    RBC 4.70 3.60 - 5.20 M/uL    HGB 12.8 (L) 13.0 - 17.2 gm/dl    HCT 39.6 37.0 - 50.0 %    MCV 84.3 80.0 - 98.0 fL    MCH 27.2 25.4 - 34.6 pg    MCHC 32.3 30.0 - 36.0 gm/dl    PLATELET 256 140 - 450 1000/mm3    MPV 12.8 (H) 6.0 - 10.0 fL    RDW-SD 41.5 36.4 - 46.3      NRBC 0 0 - 0      IMMATURE GRANULOCYTES 0.4 0.0 - 3.0 %    NEUTROPHILS 89.8 (H) 34 - 64 %    LYMPHOCYTES 8.5 (L) 28 - 48 %    MONOCYTES 1.1 1 - 13 %    EOSINOPHILS 0.0 0 - 5 %    BASOPHILS 0.2 0 - 3 %   LIPASE     Collection Time: 08/08/17  9:28 AM   Result Value Ref Range    Lipase 38 (L) 73 - 614 U/L   METABOLIC PANEL, COMPREHENSIVE    Collection Time: 08/08/17  9:28 AM   Result Value Ref Range    Sodium 132 (L) 136 - 145 mEq/L    Potassium 4.7 3.5 - 5.1 mEq/L    Chloride 101 98 - 107 mEq/L    CO2 19 (L) 21 - 32 mEq/L    Glucose 297 (H) 74 - 106 mg/dl    BUN 21 7 - 25 mg/dl    Creatinine 1.2 0.6 - 1.3 mg/dl    GFR est AA >60.0      GFR est non-AA 56      Calcium 9.4 8.5 - 10.1 mg/dl    AST (SGOT) 36 15 - 37 U/L    ALT (SGPT) 20 12 -  78 U/L    Alk. phosphatase 128 (H) 45 - 117 U/L    Bilirubin, total 1.0 0.2 - 1.0 mg/dl    Protein, total 9.0 (H) 6.4 - 8.2 gm/dl    Albumin 4.4 3.4 - 5.0 gm/dl    Anion gap 12 5 - 15 mmol/L   HEMOGLOBIN A1C W/O EAG    Collection Time: 08/08/17  9:28 AM   Result Value Ref Range    Hemoglobin A1c 9.1 (H) 4.2 - 6.3 %   GLUCOSE, POC    Collection Time: 08/08/17 10:01 AM   Result Value Ref Range    Glucose (POC) 264 (H) 65 - 536 mg/dL   METABOLIC PANEL, BASIC    Collection Time: 08/08/17 11:42 AM   Result Value Ref Range    Sodium 136 136 - 145 mEq/L    Potassium 4.2 3.5 - 5.1 mEq/L    Chloride 109 (H) 98 - 107 mEq/L    CO2 18 (L) 21 - 32 mEq/L    Glucose 296 (H) 74 - 106 mg/dl    BUN 22 7 - 25 mg/dl    Creatinine 1.0 0.6 - 1.3 mg/dl    GFR est AA >60.0      GFR est non-AA >60      Calcium 6.9 (L) 8.5 - 10.1 mg/dl    Anion gap 9 5 - 15 mmol/L   LACTIC ACID    Collection Time: 08/08/17  2:23 PM   Result Value Ref Range    Lactic Acid 1.8 0.4 - 2.0 mmol/L   GLUCOSE, POC    Collection Time: 08/08/17  5:17 PM   Result Value Ref Range    Glucose (POC) 369 (H) 65 - 105 mg/dL   DRUG SCREEN, URINE    Collection Time: 08/08/17  6:15 PM   Result Value Ref Range    Amphetamine NEGATIVE NEGATIVE      Barbiturates NEGATIVE NEGATIVE      Benzodiazepines NEGATIVE NEGATIVE      Cocaine NEGATIVE NEGATIVE      Marijuana POSITIVE (A) NEGATIVE      Methadone NEGATIVE NEGATIVE      Opiates NEGATIVE NEGATIVE       Phencyclidine NEGATIVE NEGATIVE       ED Course     Patient Vitals for the past 12 hrs:   Temp Pulse Resp BP SpO2   08/08/17 1505 98.5 ??F (36.9 ??C) (!) 111 18 112/50 99 %   08/08/17 1402 98.6 ??F (37 ??C) ??? 16 127/63 100 %   08/08/17 1301 ??? ??? ??? 132/72 100 %   08/08/17 1223 98.5 ??F (36.9 ??C) (!) 106 18 142/78 100 %   08/08/17 1122 ??? ??? ??? ??? 100 %   08/08/17 1038 98.6 ??F (37 ??C) (!) 113 20 146/86 100 %   08/08/17 1037 ??? ??? ??? ??? 99 %   08/08/17 0858 99.1 ??F (37.3 ??C) (!) 110 16 (!) 185/102 100 %       ED Course as of Aug 08 1932   Sat Aug 08, 2017   1230 CO2(!): 18 [SH]   1230 CO2(!): 19 [SH]   1230 CO2(!): 18 [SH]   1230 Anion gap: 9 [SH]   1230 Anion gap: 12 [SH]      ED Course User Index  [SH] Hilton Sinclair, PA-C       Medications   lisinopril (PRINIVIL, ZESTRIL) tablet 20 mg (20 mg Oral Given 08/08/17 1010)   lisinopril (PRINIVIL, ZESTRIL) tablet 20  mg (has no administration in time range)   acetaminophen (TYLENOL) tablet 650 mg (has no administration in time range)     Or   acetaminophen (TYLENOL) solution 650 mg (has no administration in time range)     Or   acetaminophen (TYLENOL) suppository 650 mg (has no administration in time range)   naloxone (NARCAN) injection 0.1 mg (has no administration in time range)   0.9% sodium chloride infusion (125 mL/hr IntraVENous New Bag 08/08/17 1530)   dicyclomine (BENTYL) capsule 10 mg (has no administration in time range)   ondansetron (ZOFRAN) injection 4 mg (has no administration in time range)   promethazine (PHENERGAN) 12.5 mg in NS 50 mL IVPB (has no administration in time range)   heparin (porcine) injection 5,000 Units (5,000 Units SubCUTAneous Refused 08/08/17 1600)   dextrose (D50) infusion 5-25 g (has no administration in time range)   glucagon (GLUCAGEN) injection 1 mg (has no administration in time range)   insulin glargine (LANTUS) injection 1-100 Units (has no administration in time range)   insulin lispro (HUMALOG) injection 1-100 Units (5 Units SubCUTAneous Given  08/08/17 1815)   insulin lispro (HUMALOG) injection 1-100 Units (4 Units SubCUTAneous Given 08/08/17 1815)   famotidine (PF) (PEPCID) 20 mg in sodium chloride 0.9% 10 mL injection (20 mg IntraVENous Given 08/08/17 1436)   sodium chloride 0.9 % bolus infusion 1,000 mL (0 mL IntraVENous IV Completed 08/08/17 1414)   haloperidol lactate (HALDOL) injection 2 mg (2 mg IntraVENous Given 08/08/17 1010)   LORazepam (ATIVAN) injection 1 mg (1 mg IntraVENous Given 08/08/17 1010)   diphenhydrAMINE (BENADRYL) injection 50 mg (50 mg IntraVENous Given 08/08/17 1010)   0.9% sodium chloride infusion (125 mL/hr IntraVENous New Bag 08/08/17 1436)       Procedures  Medical Decision Making   Patient had presented with intractable nausea and vomiting which I suspect is a gastroparesis flare.  She also was noted to have a bit of a anion gap on her labs.  When we repeated her labs she narrowed her gap but her CO2 increased which was rather odd.  Concerned that she is starting to go into DKA.  We have discussed with the hospitalist who is agreed to admit the patient for observation and hydration and to work on getting her blood sugar under better control.    Final Diagnosis       ICD-10-CM ICD-9-CM   1. Intractable cyclical vomiting with nausea G43.A1 536.2   2. Diabetic gastroparesis (HCC) E11.43 250.60    K31.84 536.3   3. Slow transit constipation K59.01 564.01   4. Marijuana use F12.90 305.20   5. Insulin long-term use (HCC) Z79.4 V58.67   6. Leukocytosis, unspecified typeChronic D72.829 288.60   7. Essential hypertension I10 401.9       Disposition   Admit to hospital    Hilton Sinclair, MPA, PA-C  August 08, 2017  9:36 AM    The patient was personally evaluated by myself and [facility], Jeanine Luz, MD who agrees with the above assessment and plan.    My signature above authenticates this document and my orders, the final diagnosis(es), discharge prescription (s), and instructions in the Epic  record. If you have any questions please contact [phone].     Nursing notes have been reviewed by the Physician/Advanced Practice Clinician. Dragon medical dictation software was used for portions of this report. Unintended voice recognition errors may occur.

## 2017-08-08 NOTE — ED Triage Notes (Signed)
Arrived to ED via EMS in wheelchair from home with c/o LLQ abd pain and hx of gastric stimulator placed 3 years ago.

## 2017-08-08 NOTE — Other (Signed)
Bedside and Verbal shift change report given to a spellman (oncoming nurse) by Mardelle Mattej. anthoney (offgoing nurse). Report included the following information Kardex, ED Summary, Intake/Output, MAR and Recent Results.

## 2017-08-09 LAB — POCT GLUCOSE
POC Glucose: 334 mg/dL — ABNORMAL HIGH (ref 65–105)
POC Glucose: 146 mg/dL — ABNORMAL HIGH (ref 65–105)
POC Glucose: 168 mg/dL — ABNORMAL HIGH (ref 65–105)

## 2017-08-09 LAB — CBC WITH AUTO DIFFERENTIAL
Basophils %: 0.4 % (ref 0–3)
Eosinophils %: 0 % (ref 0–5)
Hematocrit: 31.9 % — ABNORMAL LOW (ref 37.0–50.0)
Hemoglobin: 10 gm/dl — ABNORMAL LOW (ref 13.0–17.2)
Immature Granulocytes: 0.4 % (ref 0.0–3.0)
Lymphocytes %: 32.4 % (ref 28–48)
MCH: 26.2 pg (ref 25.4–34.6)
MCHC: 31.3 gm/dl (ref 30.0–36.0)
MCV: 83.7 fL (ref 80.0–98.0)
MPV: 12 fL — ABNORMAL HIGH (ref 6.0–10.0)
Monocytes %: 5.3 % (ref 1–13)
Neutrophils %: 61.5 % (ref 34–64)
Nucleated RBCs: 0 (ref 0–0)
Platelets: 218 10*3/uL (ref 140–450)
RBC: 3.81 M/uL (ref 3.60–5.20)
RDW-SD: 41.3 (ref 36.4–46.3)
WBC: 13.2 10*3/uL — ABNORMAL HIGH (ref 4.0–11.0)

## 2017-08-09 LAB — BASIC METABOLIC PANEL
Anion Gap: 4 mmol/L — ABNORMAL LOW (ref 5–15)
BUN: 32 mg/dl — ABNORMAL HIGH (ref 7–25)
CO2: 23 mEq/L (ref 21–32)
Calcium: 7.8 mg/dl — ABNORMAL LOW (ref 8.5–10.1)
Chloride: 111 mEq/L — ABNORMAL HIGH (ref 98–107)
Creatinine: 1.3 mg/dl (ref 0.6–1.3)
EGFR IF NonAfrican American: 51
GFR African American: >60
Glucose: 175 mg/dl — ABNORMAL HIGH (ref 74–106)
Potassium: 4.2 mEq/L (ref 3.5–5.1)
Sodium: 138 mEq/L (ref 136–145)

## 2017-08-09 LAB — GLUCOSE, POC
Glucose (POC): 334 mg/dL — ABNORMAL HIGH (ref 65–105)
Glucose (POC): 146 mg/dL — ABNORMAL HIGH (ref 65–105)
Glucose (POC): 168 mg/dL — ABNORMAL HIGH (ref 65–105)

## 2017-08-09 LAB — CBC WITH AUTOMATED DIFF
BASOPHILS: 0.4 % (ref 0–3)
EOSINOPHILS: 0 % (ref 0–5)
HCT: 31.9 % — ABNORMAL LOW (ref 37.0–50.0)
HGB: 10 gm/dl — ABNORMAL LOW (ref 13.0–17.2)
IMMATURE GRANULOCYTES: 0.4 % (ref 0.0–3.0)
LYMPHOCYTES: 32.4 % (ref 28–48)
MCH: 26.2 pg (ref 25.4–34.6)
MCHC: 31.3 gm/dl (ref 30.0–36.0)
MCV: 83.7 fL (ref 80.0–98.0)
MONOCYTES: 5.3 % (ref 1–13)
MPV: 12 fL — ABNORMAL HIGH (ref 6.0–10.0)
NEUTROPHILS: 61.5 % (ref 34–64)
NRBC: 0 (ref 0–0)
PLATELET: 218 10*3/uL (ref 140–450)
RBC: 3.81 M/uL (ref 3.60–5.20)
RDW-SD: 41.3 (ref 36.4–46.3)
WBC: 13.2 10*3/uL — ABNORMAL HIGH (ref 4.0–11.0)

## 2017-08-09 LAB — METABOLIC PANEL, BASIC
Anion gap: 4 mmol/L — ABNORMAL LOW (ref 5–15)
BUN: 32 mg/dl — ABNORMAL HIGH (ref 7–25)
CO2: 23 mEq/L (ref 21–32)
Calcium: 7.8 mg/dl — ABNORMAL LOW (ref 8.5–10.1)
Chloride: 111 mEq/L — ABNORMAL HIGH (ref 98–107)
Creatinine: 1.3 mg/dl (ref 0.6–1.3)
GFR est AA: >60
GFR est non-AA: 51
Glucose: 175 mg/dl — ABNORMAL HIGH (ref 74–106)
Potassium: 4.2 mEq/L (ref 3.5–5.1)
Sodium: 138 mEq/L (ref 136–145)

## 2017-08-09 MED ORDER — DICYCLOMINE 10 MG CAP
10 mg | Freq: Three times a day (TID) | ORAL | Status: DC
Start: 2017-08-09 — End: 2017-08-09
  Administered 2017-08-09: 14:00:00 via ORAL

## 2017-08-09 MED ORDER — METOCLOPRAMIDE 5 MG TAB
5 mg | ORAL_TABLET | Freq: Three times a day (TID) | ORAL | 0 refills | Status: AC
Start: 2017-08-09 — End: 2017-08-14

## 2017-08-09 MED ORDER — LISINOPRIL 40 MG TAB
40 mg | ORAL_TABLET | Freq: Every day | ORAL | 0 refills | Status: AC
Start: 2017-08-09 — End: 2017-09-08

## 2017-08-09 MED ORDER — TRAMADOL 50 MG TAB
50 mg | Freq: Four times a day (QID) | ORAL | Status: DC | PRN
Start: 2017-08-09 — End: 2017-08-09
  Administered 2017-08-09: 14:00:00 via ORAL

## 2017-08-09 MED ORDER — METOCLOPRAMIDE 5 MG/ML IJ SOLN
5 mg/mL | Freq: Four times a day (QID) | INTRAMUSCULAR | Status: DC
Start: 2017-08-09 — End: 2017-08-09

## 2017-08-09 MED FILL — TRAMADOL 50 MG TAB: 50 mg | ORAL | Qty: 1

## 2017-08-09 MED FILL — DICYCLOMINE 10 MG CAP: 10 mg | ORAL | Qty: 1

## 2017-08-09 MED FILL — FAMOTIDINE (PF) 20 MG/2 ML IV: 20 mg/2 mL | INTRAVENOUS | Qty: 2

## 2017-08-09 MED FILL — ACETAMINOPHEN 325 MG TABLET: 325 mg | ORAL | Qty: 2

## 2017-08-09 MED FILL — HEPARIN (PORCINE) 5,000 UNIT/ML IJ SOLN: 5000 unit/mL | INTRAMUSCULAR | Qty: 1

## 2017-08-09 MED FILL — LISINOPRIL 20 MG TAB: 20 mg | ORAL | Qty: 1

## 2017-08-09 NOTE — Discharge Summary (Signed)
Discharge Summary by Linward Natal, MD at 08/09/17 1224                Author: Linward Natal, MD  Service: Hospitalist  Author Type: Physician       Filed: 08/09/17 1227  Date of Service: 08/09/17 1224  Status: Signed          Editor: Linward Natal, MD (Physician)                  Discharge Summary                   Patient ID:   [name] [name], 32 y.o., female   DOB: 11-25-1985      PCP:  Lyndon Code, MD      Admit Date: 08/08/2017  8:22 AM   Discharge Date:  No discharge date for patient encounter.   Length of stay: 0 day(s)   Discharge Disposition: Home or Self Care         Chief Complaint       Patient presents with        ?  Abdominal Pain             Hospital Course:         Patient is a 32 year old female with insulin-dependent type 1 diabetes complicated by diabetic gastroparesis who comes to the hospital with nausea and vomiting.  She also has epigastric pain.  She was started on PRN antiemetics.  During my examination,  patient reports that she is able to tolerate p.o. intake and that her nausea vomiting is improving however still has epigastric pain.  I advised her that epigastric pain will get better nausea and vomiting improves as well.  She will be discharged with  Reglan 3 times daily with meals.  Patient instructed to see Dr. Reatha Harps as an outpatient.        Assessment:        1.    Intractable nausea and vomiting, improved.  Secondary to gastroparesis flare and possible contribution of cannabinoid hyperemesis  syndrome   2.  Insulin-dependent type 1 diabetes, uncontrolled, with hyperglycemia complicated by diabetic gastroparesis with gastric stimulator in place.   3.  Marijuana use disorder   4.  Hypertension, uncontrolled, improving          Hospital course and discharge diagnoses were discussed with the patient. Patient is copacetic with discharge plan. I answered any questions that the patient  and/or family had.            Follow-Up Appointments:          Follow-up Information                Follow up With  Specialties  Details  Why  Contact Info              Jalbert, Tommy Rainwater, MD  Family Practice  Schedule an appointment as soon as possible for a visit in 2 weeks    [address] [facility] [address]   [address]   [place] [address]   [phone]                 Reatha Harps, MD  Gastroenterology  In 1 week  To review hospitalization and further management  [address]   [address]   [place] [address]   [phone]  Discharge Medications:          Current Discharge Medication List              START taking these medications          Details        metoclopramide HCl (REGLAN) 5 mg tablet  Take 1 Tab by mouth Before breakfast, lunch, and dinner for 5 days.   Qty: 15 Tab, Refills:  0                     CONTINUE these medications which have NOT CHANGED          Details        insulin aspart U-100 (NOVOLOG FLEXPEN U-100 INSULIN) 100 unit/mL (3 mL) inpn  Inject subcutaneously per sliding scale 3 times a day.               insulin glargine (LANTUS,BASAGLAR) 100 unit/mL (3 mL) inpn  28 Units by SubCUTAneous route daily (with lunch).               lisinopril (PRINIVIL, ZESTRIL) 20 mg tablet  Take 20 mg by mouth two (2) times a day.                           Ancillary:        Condition at discharge:   Afebrile   Ambulating   Eating, Drinking, Voiding   Stable      Consultants/Treatment Team:     Treatment Team: Attending Provider: Linward NatalPatel, Kileigh Ortmann, MD; Consulting Provider: Richarda OverlieHutchinson, Anne, DO; Care Manager: Candace Cruiseozier, Karen      Most Recent Labs:     Recent Results (from the past 24 hour(s))     LACTIC ACID          Collection Time: 08/08/17  2:23 PM         Result  Value  Ref Range            Lactic Acid  1.8  0.4 - 2.0 mmol/L       GLUCOSE, POC          Collection Time: 08/08/17  5:17 PM         Result  Value  Ref Range            Glucose (POC)  369 (H)  65 - 105 mg/dL       DRUG SCREEN, URINE          Collection Time: 08/08/17  6:15 PM         Result  Value  Ref Range             Amphetamine  NEGATIVE  NEGATIVE         Barbiturates  NEGATIVE  NEGATIVE         Benzodiazepines  NEGATIVE  NEGATIVE         Cocaine  NEGATIVE  NEGATIVE         Marijuana  POSITIVE (A)  NEGATIVE         Methadone  NEGATIVE  NEGATIVE         Opiates  NEGATIVE  NEGATIVE         Phencyclidine  NEGATIVE  NEGATIVE         CULTURE, URINE          Collection Time: 08/08/17  6:15 PM         Result  Value  Ref  Range            Culture result  No Growth To Date          GLUCOSE, POC          Collection Time: 08/08/17  9:53 PM         Result  Value  Ref Range            Glucose (POC)  334 (H)  65 - 105 mg/dL       METABOLIC PANEL, BASIC          Collection Time: 08/09/17  7:37 AM         Result  Value  Ref Range            Sodium  138  136 - 145 mEq/L       Potassium  4.2  3.5 - 5.1 mEq/L       Chloride  111 (H)  98 - 107 mEq/L       CO2  23  21 - 32 mEq/L       Glucose  175 (H)  74 - 106 mg/dl       BUN  32 (H)  7 - 25 mg/dl       Creatinine  1.3  0.6 - 1.3 mg/dl       GFR est AA  >16.1          GFR est non-AA  51          Calcium  7.8 (L)  8.5 - 10.1 mg/dl       Anion gap  4 (L)  5 - 15 mmol/L       CBC WITH AUTOMATED DIFF          Collection Time: 08/09/17  7:37 AM         Result  Value  Ref Range            WBC  13.2 (H)  4.0 - 11.0 1000/mm3       RBC  3.81  3.60 - 5.20 M/uL       HGB  10.0 (L)  13.0 - 17.2 gm/dl       HCT  09.6 (L)  04.5 - 50.0 %       MCV  83.7  80.0 - 98.0 fL       MCH  26.2  25.4 - 34.6 pg       MCHC  31.3  30.0 - 36.0 gm/dl       PLATELET  409  811 - 450 1000/mm3       MPV  12.0 (H)  6.0 - 10.0 fL       RDW-SD  41.3  36.4 - 46.3         NRBC  0  0 - 0         IMMATURE GRANULOCYTES  0.4  0.0 - 3.0 %       NEUTROPHILS  61.5  34 - 64 %       LYMPHOCYTES  32.4  28 - 48 %       MONOCYTES  5.3  1 - 13 %       EOSINOPHILS  0.0  0 - 5 %       BASOPHILS  0.4  0 - 3 %       GLUCOSE, POC          Collection Time: 08/09/17  8:37 AM  Result  Value  Ref Range            Glucose (POC)  146 (H)  65 - 105  mg/dL              Total discharge time 25 minutes.       Portions of this electronic record were dictated using Conservation officer, historic buildings. Unintended errors in translation may occur.            Linward Natal, MD   [facility]   August 09, 2017   12:24 PM

## 2017-08-09 NOTE — Progress Notes (Signed)
Discharge Date: 08/09/2017    Discharge Location: Home    Discharge Needs: None    Transportation: Family will transport home     Communication with: Patient, RN, MD

## 2017-08-09 NOTE — Other (Addendum)
----------  DocumentID: [id number]------------------------------------------------              [facility]                       Patient Education Report         Name: [name] [name], [name] [name]                  Date: 08/08/2017    MRN: [id number]                    Time: 3:00:07 PM         Patient ordered video: 'Patient Safety: Stay Safe While you are in the Hospital'    from [id number] via phone number: [phone] at 3:00:07 PM    Description: This program outlines some of the precautions patients can take to ensure a speedy recovery without extra complications. The video emphasizes the importance of communicating with the healthcare team.    ----------DocumentID: [id number]------------------------------------------------                       [facility]          Patient Education Report - Discharge Summary        Date: 08/09/2017   Time: 3:09:10 PM   Name: [name] [name], [name] [name]   MRN: [id number]      Account Number: [id number]      Education History:        Patient ordered video: 'Patient Safety: Stay Safe While you are in the Hospital' from [id number] on 08/08/2017 03:00:07 PM

## 2017-08-09 NOTE — Progress Notes (Signed)
Discharge Date: 08/09/2017    Discharge Location: Home    Discharge Needs: None    Transportation: Family will transport home     Communication with: Patient, RN, MD

## 2017-08-09 NOTE — Discharge Summary (Signed)
Discharge Summary           Patient ID:  [name] [name], 32 y.o., female  DOB: 06/04/1985    PCP:  Lyndon CodeJalbert, Kristen H, MD    Admit Date: 08/08/2017  8:22 AM  Discharge Date:  No discharge date for patient encounter.  Length of stay: 0 day(s)  Discharge Disposition: Home or Self Care     Chief Complaint   Patient presents with   ??? Abdominal Pain       Hospital Course:      Patient is a 32 year old female with insulin-dependent type 1 diabetes complicated by diabetic gastroparesis who comes to the hospital with nausea and vomiting.  She also has epigastric pain.  She was started on PRN antiemetics.  During my examination, patient reports that she is able to tolerate p.o. intake and that her nausea vomiting is improving however still has epigastric pain.  I advised her that epigastric pain will get better nausea and vomiting improves as well.  She will be discharged with Reglan 3 times daily with meals.  Patient instructed to see Dr. Reatha HarpsScott L Yagel as an outpatient.    Assessment:     1. Intractable nausea and vomiting, improved.  Secondary to gastroparesis flare and possible contribution of cannabinoid hyperemesis syndrome  2. Insulin-dependent type 1 diabetes, uncontrolled, with hyperglycemia complicated by diabetic gastroparesis with gastric stimulator in place.  3. Marijuana use disorder  4. Hypertension, uncontrolled, improving     Hospital course and discharge diagnoses were discussed with the patient. Patient is copacetic with discharge plan. I answered any questions that the patient and/or family had.       Follow-Up Appointments:     Follow-up Information     Follow up With Specialties Details Why Contact Info    Jalbert, Tommy RainwaterKristen H, MD Family Practice Schedule an appointment as soon as possible for a visit in 2 weeks  [address]  [address]  [place] [id number]  [phone]      Reatha HarpsYagel, Scott L, MD Gastroenterology In 1 week To review hospitalization and further management [address]   [address]  [place] [id number]  [phone]            Discharge Medications:     Current Discharge Medication List      START taking these medications    Details   metoclopramide HCl (REGLAN) 5 mg tablet Take 1 Tab by mouth Before breakfast, lunch, and dinner for 5 days.  Qty: 15 Tab, Refills: 0         CONTINUE these medications which have NOT CHANGED    Details   insulin aspart U-100 (NOVOLOG FLEXPEN U-100 INSULIN) 100 unit/mL (3 mL) inpn Inject subcutaneously per sliding scale 3 times a day.      insulin glargine (LANTUS,BASAGLAR) 100 unit/mL (3 mL) inpn 28 Units by SubCUTAneous route daily (with lunch).      lisinopril (PRINIVIL, ZESTRIL) 20 mg tablet Take 20 mg by mouth two (2) times a day.             Ancillary:     Condition at discharge:  Afebrile  Ambulating  Eating, Drinking, Voiding  Stable    Consultants/Treatment Team:    Treatment Team: Attending Provider: Linward NatalPatel, Dawnmarie Breon, MD; Consulting Provider: Richarda OverlieHutchinson, Anne, DO; Care Manager: Candace Cruiseozier, Karen    Most Recent Labs:  Recent Results (from the past 24 hour(s))   LACTIC ACID    Collection Time: 08/08/17  2:23 PM  Result Value Ref Range    Lactic Acid 1.8 0.4 - 2.0 mmol/L   GLUCOSE, POC    Collection Time: 08/08/17  5:17 PM   Result Value Ref Range    Glucose (POC) 369 (H) 65 - 105 mg/dL   DRUG SCREEN, URINE    Collection Time: 08/08/17  6:15 PM   Result Value Ref Range    Amphetamine NEGATIVE NEGATIVE      Barbiturates NEGATIVE NEGATIVE      Benzodiazepines NEGATIVE NEGATIVE      Cocaine NEGATIVE NEGATIVE      Marijuana POSITIVE (A) NEGATIVE      Methadone NEGATIVE NEGATIVE      Opiates NEGATIVE NEGATIVE      Phencyclidine NEGATIVE NEGATIVE     CULTURE, URINE    Collection Time: 08/08/17  6:15 PM   Result Value Ref Range    Culture result No Growth To Date     GLUCOSE, POC    Collection Time: 08/08/17  9:53 PM   Result Value Ref Range    Glucose (POC) 334 (H) 65 - 105 mg/dL   METABOLIC PANEL, BASIC    Collection Time: 08/09/17  7:37 AM    Result Value Ref Range    Sodium 138 136 - 145 mEq/L    Potassium 4.2 3.5 - 5.1 mEq/L    Chloride 111 (H) 98 - 107 mEq/L    CO2 23 21 - 32 mEq/L    Glucose 175 (H) 74 - 106 mg/dl    BUN 32 (H) 7 - 25 mg/dl    Creatinine 1.3 0.6 - 1.3 mg/dl    GFR est AA >24.4      GFR est non-AA 51      Calcium 7.8 (L) 8.5 - 10.1 mg/dl    Anion gap 4 (L) 5 - 15 mmol/L   CBC WITH AUTOMATED DIFF    Collection Time: 08/09/17  7:37 AM   Result Value Ref Range    WBC 13.2 (H) 4.0 - 11.0 1000/mm3    RBC 3.81 3.60 - 5.20 M/uL    HGB 10.0 (L) 13.0 - 17.2 gm/dl    HCT 01.0 (L) 27.2 - 50.0 %    MCV 83.7 80.0 - 98.0 fL    MCH 26.2 25.4 - 34.6 pg    MCHC 31.3 30.0 - 36.0 gm/dl    PLATELET 536 644 - 034 1000/mm3    MPV 12.0 (H) 6.0 - 10.0 fL    RDW-SD 41.3 36.4 - 46.3      NRBC 0 0 - 0      IMMATURE GRANULOCYTES 0.4 0.0 - 3.0 %    NEUTROPHILS 61.5 34 - 64 %    LYMPHOCYTES 32.4 28 - 48 %    MONOCYTES 5.3 1 - 13 %    EOSINOPHILS 0.0 0 - 5 %    BASOPHILS 0.4 0 - 3 %   GLUCOSE, POC    Collection Time: 08/09/17  8:37 AM   Result Value Ref Range    Glucose (POC) 146 (H) 65 - 105 mg/dL         Total discharge time 25 minutes.     Portions of this electronic record were dictated using Conservation officer, historic buildings. Unintended errors in translation may occur.        Linward Natal, MD  [facility]  August 09, 2017  12:24 PM

## 2017-08-10 LAB — CULTURE, URINE
CULTURE RESULT: 40000 — AB
Culture result: 40000 — AB

## 2017-10-03 ENCOUNTER — Inpatient Hospital Stay
Admit: 2017-10-03 | Discharge: 2017-10-03 | Disposition: A | Discharge status: Home / Self Care | Payer: MEDICARE | Attending: Emergency Medicine

## 2017-10-03 DIAGNOSIS — K3184 Gastroparesis: Secondary | ICD-10-CM

## 2017-10-03 LAB — CBC WITH AUTO DIFFERENTIAL
Basophils %: 0.3 % (ref 0–3)
Eosinophils %: 0 % (ref 0–5)
Hematocrit: 40.6 % (ref 37.0–50.0)
Hemoglobin: 13.2 gm/dl (ref 13.0–17.2)
Immature Granulocytes: 0.3 % (ref 0.0–3.0)
Lymphocytes %: 7.7 % — ABNORMAL LOW (ref 28–48)
MCH: 27 pg (ref 25.4–34.6)
MCHC: 32.5 gm/dl (ref 30.0–36.0)
MCV: 83.2 fL (ref 80.0–98.0)
MPV: 12.6 fL — ABNORMAL HIGH (ref 6.0–10.0)
Monocytes %: 1.2 % (ref 1–13)
Neutrophils %: 90.5 % — ABNORMAL HIGH (ref 34–64)
Nucleated RBCs: 0 (ref 0–0)
Platelets: 280 10*3/uL (ref 140–450)
RBC: 4.88 M/uL (ref 3.60–5.20)
RDW-SD: 42.8 (ref 36.4–46.3)
WBC: 18.9 10*3/uL — ABNORMAL HIGH (ref 4.0–11.0)

## 2017-10-03 LAB — COMPREHENSIVE METABOLIC PANEL
ALT: 18 U/L (ref 12–78)
AST: 27 U/L (ref 15–37)
Albumin: 4.4 gm/dl (ref 3.4–5.0)
Alkaline Phosphatase: 107 U/L (ref 45–117)
Anion Gap: 9 mmol/L (ref 5–15)
BUN: 11 mg/dl (ref 7–25)
CO2: 24 mEq/L (ref 21–32)
Calcium: 9.3 mg/dl (ref 8.5–10.1)
Chloride: 99 mEq/L (ref 98–107)
Creatinine: 1 mg/dl (ref 0.6–1.3)
EGFR IF NonAfrican American: >60
GFR African American: >60
Glucose: 218 mg/dl — ABNORMAL HIGH (ref 74–106)
Potassium: 3.9 mEq/L (ref 3.5–5.1)
Sodium: 131 mEq/L — ABNORMAL LOW (ref 136–145)
Total Bilirubin: 1 mg/dl (ref 0.2–1.0)
Total Protein: 8.9 gm/dl — ABNORMAL HIGH (ref 6.4–8.2)

## 2017-10-03 LAB — LIPASE
Lipase: 51 U/L — ABNORMAL LOW (ref 73–393)
Lipase: 51 U/L — ABNORMAL LOW (ref 73–393)

## 2017-10-03 LAB — CBC WITH AUTOMATED DIFF
BASOPHILS: 0.3 % (ref 0–3)
EOSINOPHILS: 0 % (ref 0–5)
HCT: 40.6 % (ref 37.0–50.0)
HGB: 13.2 gm/dl (ref 13.0–17.2)
IMMATURE GRANULOCYTES: 0.3 % (ref 0.0–3.0)
LYMPHOCYTES: 7.7 % — ABNORMAL LOW (ref 28–48)
MCH: 27 pg (ref 25.4–34.6)
MCHC: 32.5 gm/dl (ref 30.0–36.0)
MCV: 83.2 fL (ref 80.0–98.0)
MONOCYTES: 1.2 % (ref 1–13)
MPV: 12.6 fL — ABNORMAL HIGH (ref 6.0–10.0)
NEUTROPHILS: 90.5 % — ABNORMAL HIGH (ref 34–64)
NRBC: 0 (ref 0–0)
PLATELET: 280 10*3/uL (ref 140–450)
RBC: 4.88 M/uL (ref 3.60–5.20)
RDW-SD: 42.8 (ref 36.4–46.3)
WBC: 18.9 10*3/uL — ABNORMAL HIGH (ref 4.0–11.0)

## 2017-10-03 LAB — METABOLIC PANEL, COMPREHENSIVE
ALT (SGPT): 18 U/L (ref 12–78)
AST (SGOT): 27 U/L (ref 15–37)
Albumin: 4.4 gm/dl (ref 3.4–5.0)
Alk. phosphatase: 107 U/L (ref 45–117)
Anion gap: 9 mmol/L (ref 5–15)
BUN: 11 mg/dl (ref 7–25)
Bilirubin, total: 1 mg/dl (ref 0.2–1.0)
CO2: 24 mEq/L (ref 21–32)
Calcium: 9.3 mg/dl (ref 8.5–10.1)
Chloride: 99 mEq/L (ref 98–107)
Creatinine: 1 mg/dl (ref 0.6–1.3)
GFR est AA: >60
GFR est non-AA: >60
Glucose: 218 mg/dl — ABNORMAL HIGH (ref 74–106)
Potassium: 3.9 mEq/L (ref 3.5–5.1)
Protein, total: 8.9 gm/dl — ABNORMAL HIGH (ref 6.4–8.2)
Sodium: 131 mEq/L — ABNORMAL LOW (ref 136–145)

## 2017-10-03 MED ORDER — SODIUM CHLORIDE 0.9% BOLUS IV
0.9 % | INTRAVENOUS | Status: AC
Start: 2017-10-03 — End: 2017-10-03
  Administered 2017-10-03: 13:00:00 via INTRAVENOUS

## 2017-10-03 MED ORDER — PROMETHAZINE 25 MG TAB
25 mg | ORAL_TABLET | Freq: Four times a day (QID) | ORAL | 0 refills | Status: DC | PRN
Start: 2017-10-03 — End: 2017-12-24

## 2017-10-03 MED ORDER — DIPHENHYDRAMINE HCL 50 MG/ML IJ SOLN
50 mg/mL | INTRAMUSCULAR | Status: AC
Start: 2017-10-03 — End: 2017-10-03
  Administered 2017-10-03: 13:00:00 via INTRAVENOUS

## 2017-10-03 MED ORDER — HALOPERIDOL LACTATE 5 MG/ML IJ SOLN
5 mg/mL | INTRAMUSCULAR | Status: AC
Start: 2017-10-03 — End: 2017-10-03
  Administered 2017-10-03: 13:00:00 via INTRAMUSCULAR

## 2017-10-03 MED ORDER — LORAZEPAM 2 MG/ML IJ SOLN
2 mg/mL | INTRAMUSCULAR | Status: AC
Start: 2017-10-03 — End: 2017-10-03
  Administered 2017-10-03: 15:00:00 via INTRAVENOUS

## 2017-10-03 MED ORDER — METOCLOPRAMIDE 10 MG TAB
10 mg | ORAL_TABLET | Freq: Four times a day (QID) | ORAL | 0 refills | Status: DC | PRN
Start: 2017-10-03 — End: 2017-10-03

## 2017-10-03 MED ORDER — SODIUM CHLORIDE 0.9 % IJ SYRG
Freq: Once | INTRAMUSCULAR | Status: AC
Start: 2017-10-03 — End: 2017-10-03
  Administered 2017-10-03: 13:00:00 via INTRAVENOUS

## 2017-10-03 MED ORDER — CLONIDINE 0.1 MG/24 HR WEEKLY TRANSDERM PATCH
0.1 mg/24 hr | TRANSDERMAL | Status: DC
Start: 2017-10-03 — End: 2017-10-03

## 2017-10-03 MED ORDER — SODIUM CHLORIDE 0.9% BOLUS IV
0.9 % | INTRAVENOUS | Status: AC
Start: 2017-10-03 — End: 2017-10-03
  Administered 2017-10-03: 14:00:00 via INTRAVENOUS

## 2017-10-03 MED FILL — DIPHENHYDRAMINE HCL 50 MG/ML IJ SOLN: 50 mg/mL | INTRAMUSCULAR | Qty: 1

## 2017-10-03 MED FILL — LORAZEPAM 2 MG/ML IJ SOLN: 2 mg/mL | INTRAMUSCULAR | Qty: 1

## 2017-10-03 MED FILL — HALOPERIDOL LACTATE 5 MG/ML IJ SOLN: 5 mg/mL | INTRAMUSCULAR | Qty: 1

## 2017-10-03 MED FILL — CLONIDINE 0.1 MG/24 HR WEEKLY TRANSDERM PATCH: 0.1 mg/24 hr | TRANSDERMAL | Qty: 1

## 2017-10-03 NOTE — ED Notes (Signed)
Pt comes to ED via medic c/o abdominal pain c/ N/V. Pt has a hx of gastroparesis. Pt states she has thrown up over 20 times. Pt actively vomiting during triaging. Pt denies diarrhea. Pt states she smoked weed this morning to try to help with the pain

## 2017-10-03 NOTE — ED Notes (Signed)
2:26 PM  10/03/17     Discharge instructions given to patient (name) with verbalization of understanding. Patient accompanied by patient.  Patient discharged with the following prescriptions   Current Discharge Medication List      START taking these medications    Details   promethazine (PHENERGAN) 25 mg tablet Take 1 Tab by mouth every six (6) hours as needed for Nausea.  Qty: 12 Tab, Refills: 0          . Patient discharged to Home (destination).      Dewitt Hoes, RN

## 2017-10-03 NOTE — ED Provider Notes (Signed)
ED Provider Notes by Jenelle Mages, PA-C at 10/03/17 5726                Author: Jenelle Mages, PA-C  Service: Emergency Medicine  Author Type: Physician Assistant       Filed: 10/03/17 1446  Date of Service: 10/03/17 0815  Status: Attested           Editor: Norris Cross (Physician Assistant)  Cosigner: Lennox Laity, MD at 10/03/17 1847          Attestation signed by Lennox Laity, MD at 10/03/17 1847          I have discussed this case with the advanced practice provider. We have reviewed the presentation, pertinent historical and physical exam findings, as well  as relevant  findings. I have been available at all times and agree with the management and disposition documented here.      Lennox Laity, MD   October 03, 2017                                    [place]   Emergency Department Treatment Report                Patient: [name] [name]  Age: 32 y.o.  Sex: female          Date of Birth: March 14, 1985  Admit Date: 10/03/2017  PCP: Pecola Lawless, MD     MRN: [id number]   CSN: [id number]            Room: ER37/ER37  Time Dictated: 8:15 AM          Attending MD: Lennox Laity, MD   APC:  Maryanna Shape PA-C      Chief Complaint    Abdominal pain, vomiting        History of Present Illness     32 y.o. female  who is type I diabetic with a known history of gastroparesis and marijuana use comes into the ER for evaluation of vomiting and nausea with upper abdominal discomfort that started yesterday.  Patient states this feels like an exacerbation of her gastroparesis.   She tried Zofran at home without relief.  When she has gastroparesis she always gets upper abdominal pain.  She has not had any fevers or chills.  Denies diarrhea.  Denies UTI symptoms.  States her blood sugar was controlled this morning.        Review of Systems     Review of Systems    Constitutional: Negative for fever.    HENT: Negative for congestion.     Eyes: Negative for discharge.    Respiratory:  Negative for cough and shortness of breath.     Cardiovascular: Negative for chest pain.    Gastrointestinal: Positive for abdominal pain, nausea  and vomiting. Negative for diarrhea.    Genitourinary: Negative for dysuria, flank pain, frequency, hematuria and urgency.    Musculoskeletal: Negative for back pain and myalgias.    Skin: Negative for rash.    Neurological: Negative for dizziness and headaches.    Endo/Heme/Allergies: Negative for environmental allergies.    Psychiatric/Behavioral: The patient is not nervous/anxious.          Past Medical/Surgical History          Past Medical History:        Diagnosis  Date         ?  Cyclical vomiting  1/60/1093     ?  Diabetes ([place])       ?  Diabetic ketoacidosis without coma associated with type 1 diabetes mellitus ([place])  11/05/2015     ?  Drug-seeking behavior       ?  Essential hypertension  03/27/2016          Amlodipine, carvedilol, lisinopril.         ?  Gastroparesis       ?  Gastroparesis       ?  Hypertension       ?  Insulin long-term use ([place])  11/20/2016     ?  Slow transit constipation  08/08/2017     ?  Type 1 diabetes mellitus ([place])  02/17/2009          Diagnosed at age 8. Had insulin pump at one time, but developed keloids at needle insertion sites. Was supposed to establish with [facility].          Past Surgical History:         Procedure  Laterality  Date          ?  ABDOMEN SURGERY PROC UNLISTED    05/2015          Gastric stimulator pump           ?  HX CESAREAN SECTION              Social History          Social History          Socioeconomic History         ?  Marital status:  SINGLE              Spouse name:  Not on file         ?  Number of children:  Not on file     ?  Years of education:  Not on file     ?  Highest education level:  Not on file       Occupational History        ?  Not on file       Social Needs         ?  Financial resource strain:  Not on file        ?  Food insecurity:              Worry:  Not on file         Inability:   Not on file        ?  Transportation needs:              Medical:  Not on file         Non-medical:  Not on file       Tobacco Use         ?  Smoking status:  Never Smoker     ?  Smokeless tobacco:  Never Used       Substance and Sexual Activity         ?  Alcohol use:  No     ?  Drug use:  Yes              Types:  Marijuana         ?  Sexual activity:  Yes              Birth control/protection:  Injection  Lifestyle        ?  Physical activity:              Days per week:  Not on file         Minutes per session:  Not on file         ?  Stress:  Not on file       Relationships        ?  Social connections:              Talks on phone:  Not on file         Gets together:  Not on file         Attends religious service:  Not on file         Active member of club or organization:  Not on file         Attends meetings of clubs or organizations:  Not on file         Relationship status:  Not on file        ?  Intimate partner violence:              Fear of current or ex partner:  Not on file         Emotionally abused:  Not on file         Physically abused:  Not on file         Forced sexual activity:  Not on file        Other Topics  Concern        ?  Not on file       Social History Narrative        ?  Not on file          Family History          Family History         Problem  Relation  Age of Onset          ?  Diabetes  Father       ?  Stroke  Other                great grandmother          Current Medications          Prior to Admission Medications     Prescriptions  Last Dose  Informant  Patient Reported?  Taking?      insulin aspart U-100 (NOVOLOG FLEXPEN U-100 INSULIN) 100 unit/mL (3 mL) inpn      Yes  No      Sig: Inject subcutaneously per sliding scale 3 times a day.      insulin glargine (LANTUS,BASAGLAR) 100 unit/mL (3 mL) inpn      Yes  No      Sig: 28 Units by SubCUTAneous route daily (with lunch).               Facility-Administered Medications: None     States she is prescribed Zofran         Allergies          Allergies        Allergen  Reactions         ?  Morphine  Itching             Pt reports itching         ?  Robaxin [Methocarbamol]  Other (comments)  Body shaking, nausea         ?  Penicillins  Hives         ?  Zithromax [Azithromycin]  Hives          Physical Exam          ED Triage Vitals     ED Encounter Vitals Group            BP  10/03/17 0721  (!) 145/101        Pulse (Heart Rate)  10/03/17 0721  87        Resp Rate  10/03/17 0721  14        Temp  10/03/17 0721  98.1 ??F (36.7 ??C)        Temp src  --          O2 Sat (%)  10/03/17 0721  98 %        Weight  10/03/17 0715  121 lb            Height  10/03/17 0715  _0         Physical Exam    Constitutional: She is oriented to person, place, and time. She appears dehydrated. She appears not lethargic.  Non-toxic appearance. She  does not have a sickly appearance. She appears distressed (Patient is kind of  rocking back and forth on the bed, not diaphoretic).    HENT:    Head: Normocephalic and atraumatic.   Mucous memories mildly dry    Eyes: Conjunctivae and EOM are normal.    Neck: Normal range of motion. Neck supple.    Cardiovascular: Normal rate and regular rhythm.    Pulmonary/Chest: Effort normal and breath sounds normal.    Abdominal: Soft. There is no tenderness. There is no rebound and no guarding.   Patient points in her left upper abdomen, no tenderness on exam, no CVA tenderness     Musculoskeletal: Normal range of motion.   Neurological: She is alert and oriented to person, place, and time. She appears not lethargic. Gait normal.  Coordination normal. GCS score is 15.    Skin: Skin is warm and dry. No rash noted. She is not diaphoretic.   Psychiatric:    Very anxious appearing         Impression and Management Plan     Patient with a known history of cyclical vomiting/gastroparesis that is exacerbated today.  Abdominal exam is benign.  Patient looks mildly dehydrated.  We will start  hydration and check labs to look  for any evidence of pancreatitis or hepatitis.  Ensure patient not in DKA which is unlikely as patient relates blood sugars have been within normal limits.  Treat symptomatically and reassess.        Diagnostic Studies     Lab:      Recent Results (from the past 12 hour(s))     CBC WITH AUTOMATED DIFF          Collection Time: 10/03/17  8:21 AM         Result  Value  Ref Range            WBC  18.9 (H)  4.0 - 11.0 1000/mm3       RBC  4.88  3.60 - 5.20 M/uL       HGB  13.2  13.0 - 17.2 gm/dl       HCT  40.6  37.0 - 50.0 %  MCV  83.2  80.0 - 98.0 fL       MCH  27.0  25.4 - 34.6 pg       MCHC  32.5  30.0 - 36.0 gm/dl       PLATELET  280  140 - 450 1000/mm3       MPV  12.6 (H)  6.0 - 10.0 fL       RDW-SD  42.8  36.4 - 46.3         NRBC  0  0 - 0         IMMATURE GRANULOCYTES  0.3  0.0 - 3.0 %       NEUTROPHILS  90.5 (H)  34 - 64 %       LYMPHOCYTES  7.7 (L)  28 - 48 %       MONOCYTES  1.2  1 - 13 %       EOSINOPHILS  0.0  0 - 5 %       BASOPHILS  0.3  0 - 3 %       LIPASE          Collection Time: 10/03/17  8:21 AM         Result  Value  Ref Range            Lipase  51 (L)  73 - 393 U/L       METABOLIC PANEL, COMPREHENSIVE          Collection Time: 10/03/17  8:21 AM         Result  Value  Ref Range            Sodium  131 (L)  136 - 145 mEq/L       Potassium  3.9  3.5 - 5.1 mEq/L       Chloride  99  98 - 107 mEq/L       CO2  24  21 - 32 mEq/L       Glucose  218 (H)  74 - 106 mg/dl       BUN  11  7 - 25 mg/dl       Creatinine  1.0  0.6 - 1.3 mg/dl       GFR est AA  >60.0          GFR est non-AA  >60          Calcium  9.3  8.5 - 10.1 mg/dl       AST (SGOT)  27  15 - 37 U/L       ALT (SGPT)  18  12 - 78 U/L       Alk. phosphatase  107  45 - 117 U/L       Bilirubin, total  1.0  0.2 - 1.0 mg/dl       Protein, total  8.9 (H)  6.4 - 8.2 gm/dl       Albumin  4.4  3.4 - 5.0 gm/dl            Anion gap  9  5 - 15 mmol/L          Labs Reviewed       CBC WITH AUTOMATED DIFF - Abnormal; Notable for the following components:             Result  Value            WBC  18.9 (*)         MPV  12.6 (*)         NEUTROPHILS  90.5 (*)         LYMPHOCYTES  7.7 (*)            All other components within normal limits       LIPASE - Abnormal; Notable for the following components:            Lipase  51 (*)            All other components within normal limits       METABOLIC PANEL, COMPREHENSIVE - Abnormal; Notable for the following components:            Sodium  131 (*)         Glucose  218 (*)         Protein, total  8.9 (*)            All other components within normal limits             ED Course        Patient Vitals for the past 12 hrs:            Temp  Pulse  Resp  BP  SpO2            10/03/17 1425  --  84  --  (!) 199/100  --            10/03/17 1230  --  86  --  (!) 207/100  100 %            10/03/17 0721  98.1 ??F (36.7 ??C)  87  14  (!) 145/101  98 %             ED Course as of Oct 04 1443       Sat Oct 03, 2017        0912  Creatinine: 1.0 [EI]     0913  BUN: 11 [EI]     0913  Potassium: 3.9 [EI]     0913  Lipase(!): 51 [EI]     0913  ? From vomiting- abdomen is non tender to palpate    WBC(!): 18.9 [EI]     0913  Glucose(!): 218 [EI]     0913  CO2: 24 [EI]     0941  Patient re-evaluated- sleeping and appears comfortable.  IVF running.  Will re-eval.     [EI]     7341  Patient is resting.  When nurse went to hang second bag of fluids, patient woke up and states she feels not much better than when she came in although  she has not vomited since she arrived here.  Discussed with Dr. Alisa Graff, Ativan ordered.     [EI]     1218  Patient is sleeping.  She arouses, tells me she feels better.  Abdominal exam is benign, no tenderness on palpation.  She does fall back to sleep easily.   Discussed with her that we could discharge her home, she needs to find a ride home.  Discussed this with RN as well, asked for recheck vitals.  They will advise me when she has a ride who arrives to discharge.     [EI]     1241  BP elevated on recheck- has hx of same, review of  hospital records reveal usually elevated on arrival.  On lisinopril, not taken today due to N/V.     [EI]  1255  Discussed BP elevation with Dr. Alisa Graff.  He advised Catapres patch which could also help with the nausea/vomiting cyclically.     [EI]        1419  Patient's ride  has arrived.  She is alert and oriented.  Still little drowsy from medications but arouses appropriately.  She tells me her ride is  here.  She continues to feel better.  No further vomiting.  I discussed with her that her blood pressure is elevated, she needs to take her home medication and see her PCP this week.     [EI]              ED Course User Index   [EI] Jenelle Mages, PA-C             Medications       cloNIDine (CATAPRES) 0.1 mg/24 hr patch 1 Patch (1 Patch TransDERmal Apply Patch 10/03/17 1310)     sodium chloride (NS) flush 5-10 mL (10 mL IntraVENous Given 10/03/17 0904)     sodium chloride 0.9 % bolus infusion 1,000 mL (0 mL IntraVENous IV Completed 10/03/17 1015)     haloperidol lactate (HALDOL) injection 2.5 mg (2.5 mg IntraMUSCular Given 10/03/17 0901)     diphenhydrAMINE (BENADRYL) injection 25 mg (25 mg IntraVENous Given 10/03/17 0900)     sodium chloride 0.9 % bolus infusion 1,000 mL (0 mL IntraVENous IV Completed 10/03/17 1311)       LORazepam (ATIVAN) injection 1 mg (1 mg IntraVENous Given 10/03/17 1057)             Medical Decision Making     Patient presenting with nausea vomiting and upper abdominal pain.  This is an acute exacerbation of a chronic underlying problem and gastroparesis.  She smoked marijuana  this morning and does so regularly which is not helping her cyclical vomiting.  After parenteral medications her vomiting is stopped, pain is subsided.  Serial abdominal exams have been benign.  Blood pressure elevated but patient asymptomatic- she is not currently taking her lisinopril due to vomiting.  Advised her of same, she will resume and follow-up PCP this week to have it rechecked.  I did write a prescription for  her for Phenergan.  She is knows to return if worse in any way.        Final Diagnosis                 ICD-10-CM  ICD-9-CM          1.  Non-intractable vomiting with nausea, unspecified vomiting type  R11.2  787.01     2.  Gastroparesis  K31.84  536.3          3.  Upper abdominal pain  R10.10  789.09                Disposition     Patient is discharged home in stable condition, with instructions to follow up with their regular doctor. They are advised to return immediately for any worsening  or symptoms of concern.        Follow-up Information               Follow up With  Specialties  Details  Why  Contact Info              Jalbert, Reyes Ivan, MD  Family Practice  Schedule an appointment as soon as possible for a visit   For follow-up of vomiting, abdominal pain.  [address]   [address]   [place] [address]   [phone]                 [facility] EMERGENCY DEPT  Emergency Medicine  Go to   If symptoms worsen  [place]   [phone]             Rx Phenergan      The patient was personally evaluated by myself and KISA, Pete Pelt, MD who agrees with the above assessment and plan.      Murlene Revell E. Gustavus Messing   October 03, 2017      My signature above authenticates this document and my orders, the final ??   diagnosis (es), discharge prescription (s), and instructions in the Epic ??   record.   If you have any questions please contact [phone].   ??   Nursing notes have been reviewed by the physician/ advanced practice ??   Clinician.      Dragon medical dictation software was used for portions of this report. Unintended voice recognition errors may occur.

## 2017-10-03 NOTE — ED Triage Notes (Addendum)
Pt comes to ED via medic c/o abdominal pain c/ N/V. Pt has a hx of gastroparesis. Pt states she has thrown up over 20 times. Pt actively vomiting during triaging. Pt denies diarrhea. Pt states she smoked weed this morning to try to help with the pain

## 2017-10-03 NOTE — ED Provider Notes (Signed)
[place]  Emergency Department Treatment Report        Patient: [name] [name] Age: 32 y.o. Sex: female    Date of Birth: [birth year] Admit Date: 10/03/2017 PCP: Pecola Lawless, MD   MRN: [id number]  CSN: [id number]     Room: ER37/ER37 Time Dictated: 8:15 AM      Attending MD: Lennox Laity, MD  APC:  Maryanna Shape PA-C    Chief Complaint   Abdominal pain, vomiting    History of Present Illness   32 y.o. female who is type I diabetic with a known history of gastroparesis and marijuana use comes into the ER for evaluation of vomiting and nausea with upper abdominal discomfort that started yesterday.  Patient states this feels like an exacerbation of her gastroparesis.  She tried Zofran at home without relief.  When she has gastroparesis she always gets upper abdominal pain.  She has not had any fevers or chills.  Denies diarrhea.  Denies UTI symptoms.  States her blood sugar was controlled this morning.    Review of Systems   Review of Systems   Constitutional: Negative for fever.   HENT: Negative for congestion.    Eyes: Negative for discharge.   Respiratory: Negative for cough and shortness of breath.    Cardiovascular: Negative for chest pain.   Gastrointestinal: Positive for abdominal pain, nausea and vomiting. Negative for diarrhea.   Genitourinary: Negative for dysuria, flank pain, frequency, hematuria and urgency.   Musculoskeletal: Negative for back pain and myalgias.   Skin: Negative for rash.   Neurological: Negative for dizziness and headaches.   Endo/Heme/Allergies: Negative for environmental allergies.   Psychiatric/Behavioral: The patient is not nervous/anxious.      Past Medical/Surgical History     Past Medical History:   Diagnosis Date   ??? Cyclical vomiting 1/60/1093   ??? Diabetes ([place])    ??? Diabetic ketoacidosis without coma associated with type 1 diabetes mellitus ([place]) 11/05/2015   ??? Drug-seeking behavior    ??? Essential hypertension 03/27/2016     Amlodipine, carvedilol, lisinopril.   ??? Gastroparesis    ??? Gastroparesis    ??? Hypertension    ??? Insulin long-term use ([place]) 11/20/2016   ??? Slow transit constipation 08/08/2017   ??? Type 1 diabetes mellitus ([place]) 02/17/2009    Diagnosed at age 41. Had insulin pump at one time, but developed keloids at needle insertion sites. Was supposed to establish with [facility].     Past Surgical History:   Procedure Laterality Date   ??? ABDOMEN SURGERY PROC UNLISTED  05/2015    Gastric stimulator pump    ??? HX CESAREAN SECTION       Social History     Social History     Socioeconomic History   ??? Marital status: SINGLE     Spouse name: Not on file   ??? Number of children: Not on file   ??? Years of education: Not on file   ??? Highest education level: Not on file   Occupational History   ??? Not on file   Social Needs   ??? Financial resource strain: Not on file   ??? Food insecurity:     Worry: Not on file     Inability: Not on file   ??? Transportation needs:     Medical: Not on file     Non-medical: Not on file   Tobacco Use   ??? Smoking status: Never Smoker   ??? Smokeless  tobacco: Never Used   Substance and Sexual Activity   ??? Alcohol use: No   ??? Drug use: Yes     Types: Marijuana   ??? Sexual activity: Yes     Birth control/protection: Injection   Lifestyle   ??? Physical activity:     Days per week: Not on file     Minutes per session: Not on file   ??? Stress: Not on file   Relationships   ??? Social connections:     Talks on phone: Not on file     Gets together: Not on file     Attends religious service: Not on file     Active member of club or organization: Not on file     Attends meetings of clubs or organizations: Not on file     Relationship status: Not on file   ??? Intimate partner violence:     Fear of current or ex partner: Not on file     Emotionally abused: Not on file     Physically abused: Not on file     Forced sexual activity: Not on file   Other Topics Concern   ??? Not on file   Social History Narrative   ??? Not on file      Family History     Family History   Problem Relation Age of Onset   ??? Diabetes Father    ??? Stroke Other         great grandmother     Current Medications     Prior to Admission Medications   Prescriptions Last Dose Informant Patient Reported? Taking?   insulin aspart U-100 (NOVOLOG FLEXPEN U-100 INSULIN) 100 unit/mL (3 mL) inpn   Yes No   Sig: Inject subcutaneously per sliding scale 3 times a day.   insulin glargine (LANTUS,BASAGLAR) 100 unit/mL (3 mL) inpn   Yes No   Sig: 28 Units by SubCUTAneous route daily (with lunch).      Facility-Administered Medications: None   States she is prescribed Zofran    Allergies     Allergies   Allergen Reactions   ??? Morphine Itching     Pt reports itching   ??? Robaxin [Methocarbamol] Other (comments)     Body shaking, nausea   ??? Penicillins Hives   ??? Zithromax [Azithromycin] Hives     Physical Exam     ED Triage Vitals   ED Encounter Vitals Group      BP 10/03/17 0721 (!) 145/101      Pulse (Heart Rate) 10/03/17 0721 87      Resp Rate 10/03/17 0721 14      Temp 10/03/17 0721 98.1 ??F (36.7 ??C)      Temp src --       O2 Sat (%) 10/03/17 0721 98 %      Weight 10/03/17 0715 121 lb      Height 10/03/17 0715 '5\' 6"'$      Physical Exam   Constitutional: She is oriented to person, place, and time. She appears dehydrated. She appears not lethargic.  Non-toxic appearance. She does not have a sickly appearance. She appears distressed (Patient is kind of rocking back and forth on the bed, not diaphoretic).   HENT:   Head: Normocephalic and atraumatic.   Mucous memories mildly dry   Eyes: Conjunctivae and EOM are normal.   Neck: Normal range of motion. Neck supple.   Cardiovascular: Normal rate and regular rhythm.   Pulmonary/Chest: Effort normal and breath  sounds normal.   Abdominal: Soft. There is no tenderness. There is no rebound and no guarding.   Patient points in her left upper abdomen, no tenderness on exam, no CVA tenderness   Musculoskeletal: Normal range of motion.    Neurological: She is alert and oriented to person, place, and time. She appears not lethargic. Gait normal. Coordination normal. GCS score is 15.   Skin: Skin is warm and dry. No rash noted. She is not diaphoretic.   Psychiatric:   Very anxious appearing     Impression and Management Plan   Patient with a known history of cyclical vomiting/gastroparesis that is exacerbated today.  Abdominal exam is benign.  Patient looks mildly dehydrated.  We will start hydration and check labs to look for any evidence of pancreatitis or hepatitis.  Ensure patient not in DKA which is unlikely as patient relates blood sugars have been within normal limits.  Treat symptomatically and reassess.    Diagnostic Studies   Lab:   Recent Results (from the past 12 hour(s))   CBC WITH AUTOMATED DIFF    Collection Time: 10/03/17  8:21 AM   Result Value Ref Range    WBC 18.9 (H) 4.0 - 11.0 1000/mm3    RBC 4.88 3.60 - 5.20 M/uL    HGB 13.2 13.0 - 17.2 gm/dl    HCT 40.6 37.0 - 50.0 %    MCV 83.2 80.0 - 98.0 fL    MCH 27.0 25.4 - 34.6 pg    MCHC 32.5 30.0 - 36.0 gm/dl    PLATELET 280 140 - 450 1000/mm3    MPV 12.6 (H) 6.0 - 10.0 fL    RDW-SD 42.8 36.4 - 46.3      NRBC 0 0 - 0      IMMATURE GRANULOCYTES 0.3 0.0 - 3.0 %    NEUTROPHILS 90.5 (H) 34 - 64 %    LYMPHOCYTES 7.7 (L) 28 - 48 %    MONOCYTES 1.2 1 - 13 %    EOSINOPHILS 0.0 0 - 5 %    BASOPHILS 0.3 0 - 3 %   LIPASE    Collection Time: 10/03/17  8:21 AM   Result Value Ref Range    Lipase 51 (L) 73 - 454 U/L   METABOLIC PANEL, COMPREHENSIVE    Collection Time: 10/03/17  8:21 AM   Result Value Ref Range    Sodium 131 (L) 136 - 145 mEq/L    Potassium 3.9 3.5 - 5.1 mEq/L    Chloride 99 98 - 107 mEq/L    CO2 24 21 - 32 mEq/L    Glucose 218 (H) 74 - 106 mg/dl    BUN 11 7 - 25 mg/dl    Creatinine 1.0 0.6 - 1.3 mg/dl    GFR est AA >60.0      GFR est non-AA >60      Calcium 9.3 8.5 - 10.1 mg/dl    AST (SGOT) 27 15 - 37 U/L    ALT (SGPT) 18 12 - 78 U/L    Alk. phosphatase 107 45 - 117 U/L     Bilirubin, total 1.0 0.2 - 1.0 mg/dl    Protein, total 8.9 (H) 6.4 - 8.2 gm/dl    Albumin 4.4 3.4 - 5.0 gm/dl    Anion gap 9 5 - 15 mmol/L     Labs Reviewed   CBC WITH AUTOMATED DIFF - Abnormal; Notable for the following components:       Result Value  WBC 18.9 (*)     MPV 12.6 (*)     NEUTROPHILS 90.5 (*)     LYMPHOCYTES 7.7 (*)     All other components within normal limits   LIPASE - Abnormal; Notable for the following components:    Lipase 51 (*)     All other components within normal limits   METABOLIC PANEL, COMPREHENSIVE - Abnormal; Notable for the following components:    Sodium 131 (*)     Glucose 218 (*)     Protein, total 8.9 (*)     All other components within normal limits       ED Course     Patient Vitals for the past 12 hrs:   Temp Pulse Resp BP SpO2   10/03/17 1425 ??? 84 ??? (!) 199/100 ???   10/03/17 1230 ??? 86 ??? (!) 207/100 100 %   10/03/17 0721 98.1 ??F (36.7 ??C) 87 14 (!) 145/101 98 %       ED Course as of Oct 04 1443   Sat Oct 03, 2017   0912 Creatinine: 1.0 [EI]   0913 BUN: 11 [EI]   0913 Potassium: 3.9 [EI]   0913 Lipase(!): 51 [EI]   0913 ? From vomiting- abdomen is non tender to palpate   WBC(!): 18.9 [EI]   0913 Glucose(!): 218 [EI]   0913 CO2: 24 [EI]   0941 Patient re-evaluated- sleeping and appears comfortable.  IVF running.  Will re-eval.    [EI]   2025 Patient is resting.  When nurse went to hang second bag of fluids, patient woke up and states she feels not much better than when she came in although she has not vomited since she arrived here.  Discussed with Dr. Alisa Graff, Ativan ordered.    [EI]   1218 Patient is sleeping.  She arouses, tells me she feels better.  Abdominal exam is benign, no tenderness on palpation.  She does fall back to sleep easily.  Discussed with her that we could discharge her home, she needs to find a ride home.  Discussed this with RN as well, asked for recheck vitals.  They will advise me when she has a ride who arrives to discharge.    [EI]    1241 BP elevated on recheck- has hx of same, review of hospital records reveal usually elevated on arrival.  On lisinopril, not taken today due to N/V.    [EI]   1255 Discussed BP elevation with Dr. Alisa Graff.  He advised Catapres patch which could also help with the nausea/vomiting cyclically.    [EI]   1419 Patient's ride  has arrived.  She is alert and oriented.  Still little drowsy from medications but arouses appropriately.  She tells me her ride is here.  She continues to feel better.  No further vomiting.  I discussed with her that her blood pressure is elevated, she needs to take her home medication and see her PCP this week.    [EI]      ED Course User Index  [EI] Jenelle Mages, PA-C       Medications   cloNIDine (CATAPRES) 0.1 mg/24 hr patch 1 Patch (1 Patch TransDERmal Apply Patch 10/03/17 1310)   sodium chloride (NS) flush 5-10 mL (10 mL IntraVENous Given 10/03/17 0904)   sodium chloride 0.9 % bolus infusion 1,000 mL (0 mL IntraVENous IV Completed 10/03/17 1015)   haloperidol lactate (HALDOL) injection 2.5 mg (2.5 mg IntraMUSCular Given 10/03/17 0901)   diphenhydrAMINE (BENADRYL)  injection 25 mg (25 mg IntraVENous Given 10/03/17 0900)   sodium chloride 0.9 % bolus infusion 1,000 mL (0 mL IntraVENous IV Completed 10/03/17 1311)   LORazepam (ATIVAN) injection 1 mg (1 mg IntraVENous Given 10/03/17 1057)       Medical Decision Making   Patient presenting with nausea vomiting and upper abdominal pain.  This is an acute exacerbation of a chronic underlying problem and gastroparesis.  She smoked marijuana this morning and does so regularly which is not helping her cyclical vomiting.  After parenteral medications her vomiting is stopped, pain is subsided.  Serial abdominal exams have been benign.  Blood pressure elevated but patient asymptomatic???she is not currently taking her lisinopril due to vomiting.  Advised her of same, she will resume and follow-up PCP this week to have it rechecked.  I did write a  prescription for her for Phenergan.  She is knows to return if worse in any way.    Final Diagnosis       ICD-10-CM ICD-9-CM   1. Non-intractable vomiting with nausea, unspecified vomiting type R11.2 787.01   2. Gastroparesis K31.84 536.3   3. Upper abdominal pain R10.10 789.09         Disposition   Patient is discharged home in stable condition, with instructions to follow up with their regular doctor. They are advised to return immediately for any worsening or symptoms of concern.    Follow-up Information     Follow up With Specialties Details Why Contact Info    Jalbert, Reyes Ivan, MD Family Practice Schedule an appointment as soon as possible for a visit  For follow-up of vomiting, abdominal pain. [address]  [address]  [place] [address]  [phone]      [facility] EMERGENCY DEPT Emergency Medicine Go to  If symptoms worsen [name]  [phone]        Rx Phenergan    The patient was personally evaluated by myself and KISA, Pete Pelt, MD who agrees with the above assessment and plan.    Aydan Levitz E. Gustavus Messing  October 03, 2017    My signature above authenticates this document and my orders, the final ??  diagnosis (es), discharge prescription (s), and instructions in the Epic ??  record.  If you have any questions please contact [phone].  ??  Nursing notes have been reviewed by the physician/ advanced practice ??  Clinician.    Dragon medical dictation software was used for portions of this report. Unintended voice recognition errors may occur.

## 2017-10-03 NOTE — ED Notes (Signed)
2:26 PM  10/03/17     Discharge instructions given to patient (name) with verbalization of understanding. Patient accompanied by patient.  Patient discharged with the following prescriptions   Current Discharge Medication List      START taking these medications    Details   promethazine (PHENERGAN) 25 mg tablet Take 1 Tab by mouth every six (6) hours as needed for Nausea.  Qty: 12 Tab, Refills: 0          . Patient discharged to Home (destination).      Kenneth R Emilio, RN

## 2017-12-24 ENCOUNTER — Inpatient Hospital Stay
Admit: 2017-12-24 | Discharge: 2017-12-25 | Disposition: A | Discharge status: Home / Self Care | Payer: MEDICARE | Attending: Emergency Medicine

## 2017-12-24 ENCOUNTER — Emergency Department: Admit: 2017-12-24 | Payer: MEDICARE

## 2017-12-24 DIAGNOSIS — G44209 Tension-type headache, unspecified, not intractable: Secondary | ICD-10-CM

## 2017-12-24 LAB — POC URINE MACROSCOPIC
Bilirubin, Urine: NEGATIVE
Bilirubin: NEGATIVE
Glucose, Ur: NEGATIVE mg/dl
Glucose: NEGATIVE mg/dl
Ketone: 15 mg/dl — AB
Ketones, Urine: 15 mg/dl — AB
Nitrite, Urine: NEGATIVE
Nitrites: NEGATIVE
Protein, UA: 100 mg/dl — AB
Protein: 100 mg/dl — AB
Specific Gravity, UA: 1.02 (ref 1.005–1.030)
Specific gravity: 1.02 (ref 1.005–1.030)
Urobilinogen, UA, POCT: 0.2 EU/dl (ref 0.0–1.0)
Urobilinogen: 0.2 EU/dl (ref 0.0–1.0)
pH (UA): 7 (ref 5–9)
pH, UA: 7 (ref 5–9)

## 2017-12-24 LAB — POC URINE MICROSCOPIC

## 2017-12-24 LAB — COMPREHENSIVE METABOLIC PANEL
ALT: 22 U/L (ref 12–78)
AST: 24 U/L (ref 15–37)
Albumin: 4.1 gm/dl (ref 3.4–5.0)
Alkaline Phosphatase: 102 U/L (ref 45–117)
Anion Gap: 7 mmol/L (ref 5–15)
BUN: 12 mg/dl (ref 7–25)
CO2: 25 mEq/L (ref 21–32)
Calcium: 9.4 mg/dl (ref 8.5–10.1)
Chloride: 102 mEq/L (ref 98–107)
Creatinine: 1.2 mg/dl (ref 0.6–1.3)
EGFR IF NonAfrican American: 55
GFR African American: >60
Glucose: 102 mg/dl (ref 74–106)
Potassium: 3.9 mEq/L (ref 3.5–5.1)
Sodium: 135 mEq/L — ABNORMAL LOW (ref 136–145)
Total Bilirubin: 1 mg/dl (ref 0.2–1.0)
Total Protein: 8.3 gm/dl — ABNORMAL HIGH (ref 6.4–8.2)

## 2017-12-24 LAB — CBC WITH AUTO DIFFERENTIAL
Basophils %: 0.4 % (ref 0–3)
Eosinophils %: 0.1 % (ref 0–5)
Hematocrit: 37.2 % (ref 37.0–50.0)
Hemoglobin: 11.9 gm/dl — ABNORMAL LOW (ref 13.0–17.2)
Immature Granulocytes: 0.3 % (ref 0.0–3.0)
Lymphocytes %: 20.5 % — ABNORMAL LOW (ref 28–48)
MCH: 27 pg (ref 25.4–34.6)
MCHC: 32 gm/dl (ref 30.0–36.0)
MCV: 84.5 fL (ref 80.0–98.0)
MPV: 12.5 fL — ABNORMAL HIGH (ref 6.0–10.0)
Monocytes %: 5.2 % (ref 1–13)
Neutrophils %: 73.5 % — ABNORMAL HIGH (ref 34–64)
Nucleated RBCs: 0 (ref 0–0)
Platelets: 272 10*3/uL (ref 140–450)
RBC: 4.4 M/uL (ref 3.60–5.20)
RDW-SD: 42.5 (ref 36.4–46.3)
WBC: 14.4 10*3/uL — ABNORMAL HIGH (ref 4.0–11.0)

## 2017-12-24 LAB — POC HCG,URINE
HCG urine, QL: NEGATIVE
Pregnancy Test(Urn): NEGATIVE

## 2017-12-24 LAB — LIPASE
Lipase: 51 U/L — ABNORMAL LOW (ref 73–393)
Lipase: 51 U/L — ABNORMAL LOW (ref 73–393)

## 2017-12-24 LAB — METABOLIC PANEL, COMPREHENSIVE
ALT (SGPT): 22 U/L (ref 12–78)
AST (SGOT): 24 U/L (ref 15–37)
Albumin: 4.1 gm/dl (ref 3.4–5.0)
Alk. phosphatase: 102 U/L (ref 45–117)
Anion gap: 7 mmol/L (ref 5–15)
BUN: 12 mg/dl (ref 7–25)
Bilirubin, total: 1 mg/dl (ref 0.2–1.0)
CO2: 25 mEq/L (ref 21–32)
Calcium: 9.4 mg/dl (ref 8.5–10.1)
Chloride: 102 mEq/L (ref 98–107)
Creatinine: 1.2 mg/dl (ref 0.6–1.3)
GFR est AA: >60
GFR est non-AA: 55
Glucose: 102 mg/dl (ref 74–106)
Potassium: 3.9 mEq/L (ref 3.5–5.1)
Protein, total: 8.3 gm/dl — ABNORMAL HIGH (ref 6.4–8.2)
Sodium: 135 mEq/L — ABNORMAL LOW (ref 136–145)

## 2017-12-24 LAB — CBC WITH AUTOMATED DIFF
BASOPHILS: 0.4 % (ref 0–3)
EOSINOPHILS: 0.1 % (ref 0–5)
HCT: 37.2 % (ref 37.0–50.0)
HGB: 11.9 gm/dl — ABNORMAL LOW (ref 13.0–17.2)
IMMATURE GRANULOCYTES: 0.3 % (ref 0.0–3.0)
LYMPHOCYTES: 20.5 % — ABNORMAL LOW (ref 28–48)
MCH: 27 pg (ref 25.4–34.6)
MCHC: 32 gm/dl (ref 30.0–36.0)
MCV: 84.5 fL (ref 80.0–98.0)
MONOCYTES: 5.2 % (ref 1–13)
MPV: 12.5 fL — ABNORMAL HIGH (ref 6.0–10.0)
NEUTROPHILS: 73.5 % — ABNORMAL HIGH (ref 34–64)
NRBC: 0 (ref 0–0)
PLATELET: 272 10*3/uL (ref 140–450)
RBC: 4.4 M/uL (ref 3.60–5.20)
RDW-SD: 42.5 (ref 36.4–46.3)
WBC: 14.4 10*3/uL — ABNORMAL HIGH (ref 4.0–11.0)

## 2017-12-24 MED ORDER — SODIUM CHLORIDE 0.9% BOLUS IV
0.9 % | INTRAVENOUS | Status: AC
Start: 2017-12-24 — End: 2017-12-24
  Administered 2017-12-24: 23:00:00 via INTRAVENOUS

## 2017-12-24 MED ORDER — SODIUM CHLORIDE 0.9 % IJ SYRG
Freq: Once | INTRAMUSCULAR | Status: DC
Start: 2017-12-24 — End: 2017-12-25

## 2017-12-24 MED ORDER — DIPHENHYDRAMINE HCL 50 MG/ML IJ SOLN
50 mg/mL | Freq: Once | INTRAMUSCULAR | Status: AC
Start: 2017-12-24 — End: 2017-12-24
  Administered 2017-12-24: 23:00:00 via INTRAVENOUS

## 2017-12-24 MED ORDER — KETOROLAC TROMETHAMINE 15 MG/ML INJECTION
15 mg/mL | INTRAMUSCULAR | Status: AC
Start: 2017-12-24 — End: 2017-12-24
  Administered 2017-12-24: 23:00:00 via INTRAVENOUS

## 2017-12-24 MED ORDER — PROMETHAZINE IN NS 25 MG/50 ML IV PIGGY BAG
25 mg/50 ml | INTRAVENOUS | Status: AC
Start: 2017-12-24 — End: 2017-12-24
  Administered 2017-12-24: 23:00:00 via INTRAVENOUS

## 2017-12-24 MED FILL — KETOROLAC TROMETHAMINE 15 MG/ML INJECTION: 15 mg/mL | INTRAMUSCULAR | Qty: 1

## 2017-12-24 MED FILL — PROMETHAZINE IN NS 25 MG/50 ML IV PIGGY BAG: 25 mg/50 ml | INTRAVENOUS | Qty: 50

## 2017-12-24 MED FILL — DIPHENHYDRAMINE HCL 50 MG/ML IJ SOLN: 50 mg/mL | INTRAMUSCULAR | Qty: 1

## 2017-12-24 NOTE — ED Notes (Signed)
I have reviewed discharge instructions with the patient.  The patient verbalized understanding.

## 2017-12-24 NOTE — ED Provider Notes (Signed)
ED Provider Notes by Maxwell Marion, [place] at 12/24/17 1727                Author: Maxwell Marion, [place]  Service: Emergency Medicine  Author Type: Physician Assistant       Filed: 12/24/17 2123  Date of Service: 12/24/17 1727  Status: Attested Addendum          Editor: Maxwell Marion, [place] (Physician Assistant)       Related Notes: Original Note by Maxwell Marion, [place] (Physician Assistant) filed at 12/24/17  2116          Cosigner: Maylon Cos, MD at 12/24/17 2219          Attestation signed by Maylon Cos, MD at 12/24/17 2219          Continued by Dr. Hermina Barters dictation software was used for portions of this report. Unintended errors in transcription may occur.      I interviewed and examined the patient. I discussed with the mid-level provider agree with their evaluation and plan as documented here.      Patient is alert, neck is supple, pupils equal round reactive, she has normal strength and sensation x4.  Arms and legs are warm and well-perfused.  Patient has abdomen that was soft not tender to palpate as she was talking to me and distractible during  exam.  No rebound no guarding.      Patient has 2 complaints the first is recurrent vomiting and this is been a recurrent issue for her, she has a gastric stimulator that she spoke to her doctor in [place] about getting removed because they did not think it was working.  She was seen earlier  today at another ER but said she had to leave due to her ride.      Second complaint is a headache that she tells me she has been having off-and-on headaches for several months.  Denies trauma.  She says it is not a thunderclap onset headache she has been having this similar headache off and on for which she will usually  take a BC powder and it will help      Patient labs were reviewed she was not in DKA, we elected to image her head due to her hypertension with this intermittent headache.  She did not have red flag symptoms suggesting  she required further evaluation with lumbar puncture.  She was not thought  to require imaging of her abdomen at this time.      Discussed patient's elevated blood pressure with her she is supposed to be taking her antihypertensive.  Importance of compliance discussed.  She is advised to have this followed up in review of records her blood pressure is typically very elevated on  presentation to the ER      We believe the patient can be discharged with symptomatic treatment, to follow-up as an outpatient, returning if worse                                 [facility]   Emergency Department Treatment Report                Patient: [name] [name]  Age: 32 y.o.  Sex: female          Date of Birth: 1985-06-30  Admit Date: 12/24/2017  PCP: Pecola Lawless,  MD     MRN: [id number]   CSN: [id number]   Attending: Maylon Cos, MD         Room: NI77/OE42  Time Dictated: 5:27 PM  PNT:[id number]           Chief Complaint    Abdominal pain, headache, vomiting        History of Present Illness     32 y.o. female  with a history of type 1 diabetes with gastroparesis, DKA, hypertension, and cyclic vomiting presenting with complaints of stomach pain that began last night with associated vomiting, and nausea.  She reports that she has a headache that is frontal in  nature that it began at 8 AM this morning.  She reports that her abdominal pain is a squeezing pain in her periumbilical and epigastric region.  She reports her pain is a 9 out of 10 in severity.  She denies any treatments tried.  She reports that she  smokes marijuana additionally, but the last time she used was 3 weeks ago.  Denies fever, chills, changes in vision, focal weakness, chest pain, shortness of breath, diarrhea and constipation, and urinary symptoms.      After reviewing chart, Patient reports that she was seen at [facility] this morning for her symptoms, was unable to stay due to having to leave for her because of her bride issue,  states that she received Toradol and blood work.        Review of Systems     Review of Systems    Constitutional: Negative for chills and fever.    HENT: Negative for congestion, sinus pain and sore throat.     Eyes: Negative for blurred vision and double vision.    Respiratory: Negative for cough and shortness of breath.     Cardiovascular: Negative for chest pain and palpitations.    Gastrointestinal: Positive for abdominal pain, nausea  and vomiting.    Genitourinary: Negative for dysuria, frequency and urgency.    Skin: Negative for itching and rash.    Neurological: Positive for headaches. Negative for sensory change, speech change and focal weakness.            Past Medical/Surgical History          Past Medical History:        Diagnosis  Date         ?  Cyclical vomiting  4/00/8676     ?  Diabetes ([place])       ?  Diabetic ketoacidosis without coma associated with type 1 diabetes mellitus ([place])  11/05/2015     ?  Drug-seeking behavior       ?  Essential hypertension  03/27/2016          Amlodipine, carvedilol, lisinopril.         ?  Gastroparesis       ?  Gastroparesis       ?  Hypertension       ?  Insulin long-term use ([place])  11/20/2016     ?  Slow transit constipation  08/08/2017     ?  Type 1 diabetes mellitus ([place])  02/17/2009          Diagnosed at age 32. Had insulin pump at one time, but developed keloids at needle insertion sites. Was supposed to establish with [facility].          Past Surgical History:         Procedure  Laterality  Date          ?  ABDOMEN SURGERY PROC UNLISTED    05/2015          Gastric stimulator pump           ?  HX CESAREAN SECTION                 Social History          Social History          Socioeconomic History         ?  Marital status:  SINGLE              Spouse name:  Not on file         ?  Number of children:  Not on file     ?  Years of education:  Not on file     ?  Highest education level:  Not on file       Occupational History        ?  Not on file       Social  Needs         ?  Financial resource strain:  Not on file        ?  Food insecurity:              Worry:  Not on file         Inability:  Not on file        ?  Transportation needs:              Medical:  Not on file         Non-medical:  Not on file       Tobacco Use         ?  Smoking status:  Never Smoker     ?  Smokeless tobacco:  Never Used       Substance and Sexual Activity         ?  Alcohol use:  No     ?  Drug use:  Yes              Types:  Marijuana             Comment: sometimes         ?  Sexual activity:  Yes              Birth control/protection:  Injection       Lifestyle        ?  Physical activity:              Days per week:  Not on file         Minutes per session:  Not on file         ?  Stress:  Not on file       Relationships        ?  Social connections:              Talks on phone:  Not on file         Gets together:  Not on file         Attends religious service:  Not on file         Active member of club or organization:  Not on file         Attends meetings of clubs or organizations:  Not on file  Relationship status:  Not on file        ?  Intimate partner violence:              Fear of current or ex partner:  Not on file         Emotionally abused:  Not on file         Physically abused:  Not on file         Forced sexual activity:  Not on file        Other Topics  Concern        ?  Not on file       Social History Narrative        ?  Not on file             Family History          Family History         Problem  Relation  Age of Onset          ?  Diabetes  Father       ?  Stroke  Other                great grandmother             Current Medications          Prior to Admission Medications     Prescriptions  Last Dose  Informant  Patient Reported?  Taking?      insulin aspart U-100 (NOVOLOG FLEXPEN U-100 INSULIN) 100 unit/mL (3 mL) inpn      Yes  No      Sig: Inject subcutaneously per sliding scale 3 times a day.      insulin glargine (LANTUS,BASAGLAR) 100 unit/mL (3 mL) inpn       Yes  No      Sig: 28 Units by SubCUTAneous route daily (with lunch).      promethazine (PHENERGAN) 25 mg tablet      No  No      Sig: Take 1 Tab by mouth every six (6) hours as needed for Nausea.               Facility-Administered Medications: None             Allergies          Allergies        Allergen  Reactions         ?  Morphine  Itching             Pt denies allergy         ?  Robaxin [Methocarbamol]  Other (comments)             Body shaking, nausea         ?  Penicillins  Hives         ?  Zithromax [Azithromycin]  Hives             Physical Exam          ED Triage Vitals [12/24/17 4235]     ED Encounter Vitals Group           BP  (!) 211/114        Pulse (Heart Rate)  (!) 103        Resp Rate  20        Temp  98.2 ??F (36.8 ??C)        Temp src  O2 Sat (%)  100 %        Weight  115 lb 1.3 oz           Height  '5\' 6"'         Physical Exam   Vitals signs reviewed.   Constitutional:        General: She is not in acute distress.     Appearance: She is well-developed and normal weight. She is not toxic-appearing or diaphoretic.      Comments: Appears uncomfortable due to  pain    HENT:       Head: Normocephalic and atraumatic.      Mouth/Throat:      Mouth: Mucous membranes are dry.      Pharynx: Oropharynx is clear.   Eyes:       Conjunctiva/sclera: Conjunctivae normal.      Pupils: Pupils are equal, round, and reactive to light.    Neck:       Musculoskeletal: Normal range of motion and neck supple.      Meningeal: Brudzinski's sign and Kernig's sign absent.    Cardiovascular:       Rate and Rhythm: Normal rate and regular rhythm.      Heart sounds: Normal heart sounds. No murmur.    Pulmonary:       Effort: Pulmonary effort is normal. No respiratory distress.      Breath sounds: Normal breath sounds. No wheezing.    Abdominal:      General: Abdomen is flat. Bowel sounds are decreased. There  is no distension.      Palpations: Abdomen is soft.      Tenderness: There is tenderness in the  epigastric area  and periumbilical area. There is no right CVA tenderness or left  CVA tenderness. Negative signs include Murphy's sign, Rovsing's sign and McBurney's sign.      Comments: Patient with gastric stimulator left of her umbilicus      Musculoskeletal: Normal range of motion.    Skin:      General: Skin is warm and dry.   Neurological :       Mental Status: She is alert and oriented to person, place, and time.      Gait: Gait is intact.      Comments: Cranial nerves II to XII grossly intact  5 out of 5 strength bilaterally  to upper and lower extremities  Sensation grossly intact to bilateral upper extremities  No pronator drift  Face symmetrical  Normal finger-nose, heel-to-shin, rapid alternating movements    Psychiatric :         Mood and Affect: Affect normal.                Impression and Management Plan     32 year old female presenting with complaints of abdominal pain with associated nausea and vomiting as well as a frontal headache      DDX: Gastritis versus DKA versus cholelithiasis versus pancreatitis versus nephrolithiasis       DDX: migraine headache, tension headache, viral syndrome, intracranial tumor/bleed/mass, SAH, hypertensive headache, meningitis, sinusitis      Patient is hypertensive on arrival, and tachycardic, she is afebrile with normal O2 saturations.  On exam patient is rocking back and forth due to pain, she is well-developed, well-nourished, and nontoxic.  On exam patient has hypoactive bowel sounds,  and extreme tenderness to her epigastric abdomen with palpation.  She otherwise had a benign exam, she had a normal rate, regular rhythm,  normal heart sounds, lungs are clear to auscultation, she exhibited no CVA tenderness.  Additionally she exhibited  no meningeal signs, and she was neurologically intact.      CBC, CMP, lipase, UA, urine pregnancy, Phenergan and Benadryl for nausea, Toradol for head pain, normal saline bolus for dehydration and episodes of vomiting        Diagnostic Studies      Lab:      Recent Results (from the past 12 hour(s))     POC URINE MACROSCOPIC          Collection Time: 12/24/17  5:34 PM         Result  Value  Ref Range            Glucose  Negative  NEGATIVE,Negative mg/dl       Bilirubin  Negative  NEGATIVE,Negative         Ketone  15 (A)  NEGATIVE,Negative mg/dl       Specific gravity  1.020  1.005 - 1.030         Blood  Trace-intact (A)  NEGATIVE,Negative         pH (UA)  7.0  5 - 9         Protein  100 (A)  NEGATIVE,Negative mg/dl       Urobilinogen  0.2  0.0 - 1.0 EU/dl       Nitrites  Negative  NEGATIVE,Negative         Leukocyte Esterase  Trace (A)  NEGATIVE,Negative         Color  Yellow          Appearance  Clear          POC URINE MICROSCOPIC          Collection Time: 12/24/17  5:34 PM         Result  Value  Ref Range            Epithelial cells, squamous  1-4  /LPF       WBC  5-9  /HPF       RBC  1-4  /HPF       Bacteria  2+  /HPF       POC HCG,URINE          Collection Time: 12/24/17  5:34 PM         Result  Value  Ref Range            HCG urine, QL  negative  NEGATIVE,Negative,negative         METABOLIC PANEL, COMPREHENSIVE          Collection Time: 12/24/17  5:50 PM         Result  Value  Ref Range            Sodium  135 (L)  136 - 145 mEq/L       Potassium  3.9  3.5 - 5.1 mEq/L            Chloride  102  98 - 107 mEq/L            CO2  25  21 - 32 mEq/L       Glucose  102  74 - 106 mg/dl       BUN  12  7 - 25 mg/dl       Creatinine  1.2  0.6 - 1.3 mg/dl       GFR est AA  >60.0  GFR est non-AA  55          Calcium  9.4  8.5 - 10.1 mg/dl       AST (SGOT)  24  15 - 37 U/L       ALT (SGPT)  22  12 - 78 U/L       Alk. phosphatase  102  45 - 117 U/L       Bilirubin, total  1.0  0.2 - 1.0 mg/dl       Protein, total  8.3 (H)  6.4 - 8.2 gm/dl       Albumin  4.1  3.4 - 5.0 gm/dl       Anion gap  7  5 - 15 mmol/L       LIPASE          Collection Time: 12/24/17  5:50 PM         Result  Value  Ref Range            Lipase  51 (L)  73 - 393 U/L       CBC WITH AUTOMATED DIFF           Collection Time: 12/24/17  5:50 PM         Result  Value  Ref Range            WBC  14.4 (H)  4.0 - 11.0 1000/mm3       RBC  4.40  3.60 - 5.20 M/uL       HGB  11.9 (L)  13.0 - 17.2 gm/dl       HCT  37.2  37.0 - 50.0 %       MCV  84.5  80.0 - 98.0 fL       MCH  27.0  25.4 - 34.6 pg       MCHC  32.0  30.0 - 36.0 gm/dl       PLATELET  272  140 - 450 1000/mm3       MPV  12.5 (H)  6.0 - 10.0 fL       RDW-SD  42.5  36.4 - 46.3         NRBC  0  0 - 0         IMMATURE GRANULOCYTES  0.3  0.0 - 3.0 %       NEUTROPHILS  73.5 (H)  34 - 64 %       LYMPHOCYTES  20.5 (L)  28 - 48 %       MONOCYTES  5.2  1 - 13 %       EOSINOPHILS  0.1  0 - 5 %            BASOPHILS  0.4  0 - 3 %          Labs Reviewed       METABOLIC PANEL, COMPREHENSIVE - Abnormal; Notable for the following components:            Result  Value            Sodium  135 (*)         Protein, total  8.3 (*)            All other components within normal limits       LIPASE - Abnormal; Notable for the following components:            Lipase  51 (*)  All other components within normal limits       CBC WITH AUTOMATED DIFF - Abnormal; Notable for the following components:            WBC  14.4 (*)         HGB  11.9 (*)         MPV  12.5 (*)         NEUTROPHILS  73.5 (*)         LYMPHOCYTES  20.5 (*)            All other components within normal limits       POC URINE MACROSCOPIC - Abnormal; Notable for the following components:            Ketone  15 (*)         Blood  Trace-intact (*)         Protein  100 (*)         Leukocyte Esterase  Trace (*)            All other components within normal limits       CULTURE, URINE     POC URINE MICROSCOPIC       POC HCG,URINE           Imaging:     Ct Head Wo Cont      Result Date: 12/24/2017   INDICATION: intermittent HA elevated bp.    COMPARISON: 08/25/2013 TECHNIQUE: The CT scan of the head was performed without contrast. Coronal and sagittal reconstructions were obtained. DICOM format image data is available to  non-affiliated external healthcare  facilities or entities on a secure, media free, reciprocally searchable basis with patient authorization for 12 months following the date of the study. FINDINGS: The ventricular system shows normal dimensions and morphology. The white and gray matter  is well differentiated. No focal areas of abnormal density are seen. There is no intracranial bleed or evidence of an acute infarct.  No identification of a fracture.  Normal sinuses and mastoids.          IMPRESSION: Negative unenhanced CT head scan.               ED Course          Medications       sodium chloride (NS) flush 5-10 mL (has no administration in time range)     sodium chloride 0.9 % bolus infusion 1,000 mL (1,000 mL IntraVENous New Bag 12/24/17 1811)     ketorolac (TORADOL) injection 15 mg (15 mg IntraVENous Given 12/24/17 1812)     diphenhydrAMINE (BENADRYL) injection 25 mg (25 mg IntraVENous Given 12/24/17 1812)     promethazine (PHENERGAN) 25 mg in NS IVPB (25 mg IntraVENous New Bag 12/24/17 1811)     famotidine (PF) (PEPCID) injection 20 mg (20 mg IntraVENous Given 12/24/17 2055)     acetaminophen (OFIRMEV) infusion 1,000 mg (1,000 mg IntraVENous New Bag 12/24/17 2055)       LORazepam (ATIVAN) tablet 0.5 mg (0.5 mg Oral Given 12/24/17 2055)             7:14 PM   Patient reports that her head pain has resolved as well as her nausea and vomiting, but still complains of severe abdominal pain.  Ordered GI cocktail to see if it helps resolve patient's abdominal pain.        Medical Decision Making     She remained stable during time in the emergency room, her  pressure improved mildly during the time here.  She does have a history of hypertension, reports she did not take her blood  pressure medications today, to which she was educated she needs to take daily.      CBC with leukocytosis of 14.4 and 73.5% neutrophils, likely reactive due to patient's vomiting.  Had some mild decrease in 11.9 which appears relatively  unchanged over the last 4 months.  CMP with mild hyponatremia at 135, and elevated protein 8.3 otherwise  unremarkable CMP.  Lipase within normal limits.  UA with proteinuria, ketones, and trace leuks, 1-4 epithelial cells, 5-9 white blood cells, 1-4 RBCs and 2+ bacteria but she denies urinary symptoms.  Urine culture ordered.  CT head directly visualized  by Dr. Robina Ade with evidence of acute abnormality.      Patient received a normal saline bolus, IV Toradol, IV Phenergan, IV Benadryl, and pepcid IV for epigastric pain.  Reports that the Pepcid did not help her pain, ordered IV Tylenol and p.o. Ativan.  She reports after the IV Tylenol and the p.o. Ativan  that her pain has been improved compared to arriving to ER.  Discussed the results with patient.  He is advised that she needs to follow-up with her GI doctor on Monday.        Final Diagnosis                 ICD-10-CM  ICD-9-CM          1.  Abdominal pain, epigastric  R10.13  789.06     2.  Non-intractable vomiting with nausea, unspecified vomiting type  R11.2  787.01          3.  Acute non intractable tension-type headache  G44.209  339.10             Disposition     Dicharge home     Current Discharge Medication List                  The patient was personally evaluated by myself and Dr. Emeterio Reeve, Jeanine Luz, MD who agrees with the above assessment and plan.      Maxwell Marion, PA-C   December 24, 2017      My signature above authenticates this document and my orders, the final     diagnosis (es), discharge prescription (s), and instructions in the Epic     record.   If you have any questions please contact [phone].       Nursing notes have been reviewed by the physician/ advanced practice     Clinician.      Dragon medical dictation software was used for portions of this report. Unintended voice recognition errors may occur.

## 2017-12-24 NOTE — ED Notes (Signed)
Pt c/o of left side abd pain with n/v, denies any diarrhea.  Also states HA that started this AM

## 2017-12-24 NOTE — ED Notes (Signed)
Pt resting in bed, eyes closed, breathing even and unlabored. No needs at this time.

## 2017-12-24 NOTE — ED Triage Notes (Signed)
Pt c/o of left side abd pain with n/v, denies any diarrhea.  Also states HA that started this AM

## 2017-12-24 NOTE — Other (Signed)
Other  Not reach the patient so we are sending a certified letter certified letter number is [phone]

## 2017-12-24 NOTE — Other (Addendum)
Lab  The patient's urine came back positive for strep B.  This was shown to Earl GalaLou Ellen Klein nurse practitioner.  Louellen wanted to treat the patient but needed to know what the patient's allergy to penicillin was.  I called and could not reach the patient by phone.  I have left a note for our next callback person to try to reach the patient.  I also faxed the report to to Alvino BloodKristen Jalbart, MD

## 2017-12-24 NOTE — ED Notes (Signed)
Pt resting in bed, eyes closed, breathing even and unlabored. No needs at this time.

## 2017-12-24 NOTE — Other (Signed)
LAB- Called and made contact with the patient mother, who stated that she will have her daughter call us back. She said she had the number.

## 2017-12-24 NOTE — ED Provider Notes (Addendum)
[place]  Emergency Department Treatment Report        Patient: [name] [name] Age: 32 y.o. Sex: female    Date of Birth: [birth year] Admit Date: 12/24/2017 PCP: Pecola Lawless, MD   MRN: [id number]  CSN: [id number]  Attending: Maylon Cos, MD   Room: XN23/FT73 Time Dictated: 5:27 PM [id number]       Chief Complaint   Abdominal pain, headache, vomiting    History of Present Illness   32 y.o. female with a history of type 1 diabetes with gastroparesis, DKA, hypertension, and cyclic vomiting presenting with complaints of stomach pain that began last night with associated vomiting, and nausea.  She reports that she has a headache that is frontal in nature that it began at 8 AM this morning.  She reports that her abdominal pain is a squeezing pain in her periumbilical and epigastric region.  She reports her pain is a 9 out of 10 in severity.  She denies any treatments tried.  She reports that she smokes marijuana additionally, but the last time she used was 3 weeks ago.  Denies fever, chills, changes in vision, focal weakness, chest pain, shortness of breath, diarrhea and constipation, and urinary symptoms.    After reviewing chart, Patient reports that she was seen at [facility] this morning for her symptoms, was unable to stay due to having to leave for her because of her bride issue, states that she received Toradol and blood work.    Review of Systems   Review of Systems   Constitutional: Negative for chills and fever.   HENT: Negative for congestion, sinus pain and sore throat.    Eyes: Negative for blurred vision and double vision.   Respiratory: Negative for cough and shortness of breath.    Cardiovascular: Negative for chest pain and palpitations.   Gastrointestinal: Positive for abdominal pain, nausea and vomiting.   Genitourinary: Negative for dysuria, frequency and urgency.   Skin: Negative for itching and rash.    Neurological: Positive for headaches. Negative for sensory change, speech change and focal weakness.       Past Medical/Surgical History     Past Medical History:   Diagnosis Date   ??? Cyclical vomiting 2/37/6283   ??? Diabetes ([place])    ??? Diabetic ketoacidosis without coma associated with type 1 diabetes mellitus ([place]) 11/05/2015   ??? Drug-seeking behavior    ??? Essential hypertension 03/27/2016    Amlodipine, carvedilol, lisinopril.   ??? Gastroparesis    ??? Gastroparesis    ??? Hypertension    ??? Insulin long-term use ([place]) 11/20/2016   ??? Slow transit constipation 08/08/2017   ??? Type 1 diabetes mellitus ([place]) 02/17/2009    Diagnosed at age 23. Had insulin pump at one time, but developed keloids at needle insertion sites. Was supposed to establish with [facility].     Past Surgical History:   Procedure Laterality Date   ??? ABDOMEN SURGERY PROC UNLISTED  05/2015    Gastric stimulator pump    ??? HX CESAREAN SECTION         Social History     Social History     Socioeconomic History   ??? Marital status: SINGLE     Spouse name: Not on file   ??? Number of children: Not on file   ??? Years of education: Not on file   ??? Highest education level: Not on file   Occupational History   ??? Not on file  Social Needs   ??? Financial resource strain: Not on file   ??? Food insecurity:     Worry: Not on file     Inability: Not on file   ??? Transportation needs:     Medical: Not on file     Non-medical: Not on file   Tobacco Use   ??? Smoking status: Never Smoker   ??? Smokeless tobacco: Never Used   Substance and Sexual Activity   ??? Alcohol use: No   ??? Drug use: Yes     Types: Marijuana     Comment: sometimes   ??? Sexual activity: Yes     Birth control/protection: Injection   Lifestyle   ??? Physical activity:     Days per week: Not on file     Minutes per session: Not on file   ??? Stress: Not on file   Relationships   ??? Social connections:     Talks on phone: Not on file     Gets together: Not on file     Attends religious service: Not on file      Active member of club or organization: Not on file     Attends meetings of clubs or organizations: Not on file     Relationship status: Not on file   ??? Intimate partner violence:     Fear of current or ex partner: Not on file     Emotionally abused: Not on file     Physically abused: Not on file     Forced sexual activity: Not on file   Other Topics Concern   ??? Not on file   Social History Narrative   ??? Not on file       Family History     Family History   Problem Relation Age of Onset   ??? Diabetes Father    ??? Stroke Other         great grandmother       Current Medications     Prior to Admission Medications   Prescriptions Last Dose Informant Patient Reported? Taking?   insulin aspart U-100 (NOVOLOG FLEXPEN U-100 INSULIN) 100 unit/mL (3 mL) inpn   Yes No   Sig: Inject subcutaneously per sliding scale 3 times a day.   insulin glargine (LANTUS,BASAGLAR) 100 unit/mL (3 mL) inpn   Yes No   Sig: 28 Units by SubCUTAneous route daily (with lunch).   promethazine (PHENERGAN) 25 mg tablet   No No   Sig: Take 1 Tab by mouth every six (6) hours as needed for Nausea.      Facility-Administered Medications: None       Allergies     Allergies   Allergen Reactions   ??? Morphine Itching     Pt denies allergy   ??? Robaxin [Methocarbamol] Other (comments)     Body shaking, nausea   ??? Penicillins Hives   ??? Zithromax [Azithromycin] Hives       Physical Exam     ED Triage Vitals [12/24/17 1715]   ED Encounter Vitals Group      BP (!) 211/114      Pulse (Heart Rate) (!) 103      Resp Rate 20      Temp 98.2 ??F (36.8 ??C)      Temp src       O2 Sat (%) 100 %      Weight 115 lb 1.3 oz      Height '5\' 6"'$   Physical Exam  Vitals signs reviewed.   Constitutional:       General: She is not in acute distress.     Appearance: She is well-developed and normal weight. She is not toxic-appearing or diaphoretic.      Comments: Appears uncomfortable due to pain   HENT:      Head: Normocephalic and atraumatic.      Mouth/Throat:       Mouth: Mucous membranes are dry.      Pharynx: Oropharynx is clear.   Eyes:      Conjunctiva/sclera: Conjunctivae normal.      Pupils: Pupils are equal, round, and reactive to light.   Neck:      Musculoskeletal: Normal range of motion and neck supple.      Meningeal: Brudzinski's sign and Kernig's sign absent.   Cardiovascular:      Rate and Rhythm: Normal rate and regular rhythm.      Heart sounds: Normal heart sounds. No murmur.   Pulmonary:      Effort: Pulmonary effort is normal. No respiratory distress.      Breath sounds: Normal breath sounds. No wheezing.   Abdominal:      General: Abdomen is flat. Bowel sounds are decreased. There is no distension.      Palpations: Abdomen is soft.      Tenderness: There is tenderness in the epigastric area and periumbilical area. There is no right CVA tenderness or left CVA tenderness. Negative signs include Murphy's sign, Rovsing's sign and McBurney's sign.      Comments: Patient with gastric stimulator left of her umbilicus   Musculoskeletal: Normal range of motion.   Skin:     General: Skin is warm and dry.   Neurological:      Mental Status: She is alert and oriented to person, place, and time.      Gait: Gait is intact.      Comments: Cranial nerves II to XII grossly intact  5 out of 5 strength bilaterally to upper and lower extremities  Sensation grossly intact to bilateral upper extremities  No pronator drift  Face symmetrical  Normal finger-nose, heel-to-shin, rapid alternating movements   Psychiatric:         Mood and Affect: Affect normal.          Impression and Management Plan   32 year old female presenting with complaints of abdominal pain with associated nausea and vomiting as well as a frontal headache    DDX: Gastritis versus DKA versus cholelithiasis versus pancreatitis versus nephrolithiasis     DDX: migraine headache, tension headache, viral syndrome, intracranial tumor/bleed/mass, SAH, hypertensive headache, meningitis, sinusitis     Patient is hypertensive on arrival, and tachycardic, she is afebrile with normal O2 saturations.  On exam patient is rocking back and forth due to pain, she is well-developed, well-nourished, and nontoxic.  On exam patient has hypoactive bowel sounds, and extreme tenderness to her epigastric abdomen with palpation.  She otherwise had a benign exam, she had a normal rate, regular rhythm, normal heart sounds, lungs are clear to auscultation, she exhibited no CVA tenderness.  Additionally she exhibited no meningeal signs, and she was neurologically intact.    CBC, CMP, lipase, UA, urine pregnancy, Phenergan and Benadryl for nausea, Toradol for head pain, normal saline bolus for dehydration and episodes of vomiting    Diagnostic Studies   Lab:   Recent Results (from the past 12 hour(s))   POC URINE MACROSCOPIC    Collection Time: 12/24/17  5:34 PM   Result Value Ref Range    Glucose Negative NEGATIVE,Negative mg/dl    Bilirubin Negative NEGATIVE,Negative      Ketone 15 (A) NEGATIVE,Negative mg/dl    Specific gravity 1.020 1.005 - 1.030      Blood Trace-intact (A) NEGATIVE,Negative      pH (UA) 7.0 5 - 9      Protein 100 (A) NEGATIVE,Negative mg/dl    Urobilinogen 0.2 0.0 - 1.0 EU/dl    Nitrites Negative NEGATIVE,Negative      Leukocyte Esterase Trace (A) NEGATIVE,Negative      Color Yellow      Appearance Clear     POC URINE MICROSCOPIC    Collection Time: 12/24/17  5:34 PM   Result Value Ref Range    Epithelial cells, squamous 1-4 /LPF    WBC 5-9 /HPF    RBC 1-4 /HPF    Bacteria 2+ /HPF   POC HCG,URINE    Collection Time: 12/24/17  5:34 PM   Result Value Ref Range    HCG urine, QL negative NEGATIVE,Negative,negative     METABOLIC PANEL, COMPREHENSIVE    Collection Time: 12/24/17  5:50 PM   Result Value Ref Range    Sodium 135 (L) 136 - 145 mEq/L    Potassium 3.9 3.5 - 5.1 mEq/L    Chloride 102 98 - 107 mEq/L    CO2 25 21 - 32 mEq/L    Glucose 102 74 - 106 mg/dl    BUN 12 7 - 25 mg/dl    Creatinine 1.2 0.6 - 1.3 mg/dl     GFR est AA >60.0      GFR est non-AA 55      Calcium 9.4 8.5 - 10.1 mg/dl    AST (SGOT) 24 15 - 37 U/L    ALT (SGPT) 22 12 - 78 U/L    Alk. phosphatase 102 45 - 117 U/L    Bilirubin, total 1.0 0.2 - 1.0 mg/dl    Protein, total 8.3 (H) 6.4 - 8.2 gm/dl    Albumin 4.1 3.4 - 5.0 gm/dl    Anion gap 7 5 - 15 mmol/L   LIPASE    Collection Time: 12/24/17  5:50 PM   Result Value Ref Range    Lipase 51 (L) 73 - 393 U/L   CBC WITH AUTOMATED DIFF    Collection Time: 12/24/17  5:50 PM   Result Value Ref Range    WBC 14.4 (H) 4.0 - 11.0 1000/mm3    RBC 4.40 3.60 - 5.20 M/uL    HGB 11.9 (L) 13.0 - 17.2 gm/dl    HCT 37.2 37.0 - 50.0 %    MCV 84.5 80.0 - 98.0 fL    MCH 27.0 25.4 - 34.6 pg    MCHC 32.0 30.0 - 36.0 gm/dl    PLATELET 272 140 - 450 1000/mm3    MPV 12.5 (H) 6.0 - 10.0 fL    RDW-SD 42.5 36.4 - 46.3      NRBC 0 0 - 0      IMMATURE GRANULOCYTES 0.3 0.0 - 3.0 %    NEUTROPHILS 73.5 (H) 34 - 64 %    LYMPHOCYTES 20.5 (L) 28 - 48 %    MONOCYTES 5.2 1 - 13 %    EOSINOPHILS 0.1 0 - 5 %    BASOPHILS 0.4 0 - 3 %     Labs Reviewed   METABOLIC PANEL, COMPREHENSIVE - Abnormal; Notable for the following components:  Result Value    Sodium 135 (*)     Protein, total 8.3 (*)     All other components within normal limits   LIPASE - Abnormal; Notable for the following components:    Lipase 51 (*)     All other components within normal limits   CBC WITH AUTOMATED DIFF - Abnormal; Notable for the following components:    WBC 14.4 (*)     HGB 11.9 (*)     MPV 12.5 (*)     NEUTROPHILS 73.5 (*)     LYMPHOCYTES 20.5 (*)     All other components within normal limits   POC URINE MACROSCOPIC - Abnormal; Notable for the following components:    Ketone 15 (*)     Blood Trace-intact (*)     Protein 100 (*)     Leukocyte Esterase Trace (*)     All other components within normal limits   CULTURE, URINE   POC URINE MICROSCOPIC   POC HCG,URINE       Imaging:    Ct Head Wo Cont    Result Date: 12/24/2017   INDICATION: intermittent HA elevated bp.    COMPARISON: 08/25/2013 TECHNIQUE: The CT scan of the head was performed without contrast. Coronal and sagittal reconstructions were obtained. DICOM format image data is available to non-affiliated external healthcare facilities or entities on a secure, media free, reciprocally searchable basis with patient authorization for 12 months following the date of the study. FINDINGS: The ventricular system shows normal dimensions and morphology. The white and gray matter is well differentiated. No focal areas of abnormal density are seen. There is no intracranial bleed or evidence of an acute infarct.  No identification of a fracture.  Normal sinuses and mastoids.        IMPRESSION: Negative unenhanced CT head scan.          ED Course     Medications   sodium chloride (NS) flush 5-10 mL (has no administration in time range)   sodium chloride 0.9 % bolus infusion 1,000 mL (1,000 mL IntraVENous New Bag 12/24/17 1811)   ketorolac (TORADOL) injection 15 mg (15 mg IntraVENous Given 12/24/17 1812)   diphenhydrAMINE (BENADRYL) injection 25 mg (25 mg IntraVENous Given 12/24/17 1812)   promethazine (PHENERGAN) 25 mg in NS IVPB (25 mg IntraVENous New Bag 12/24/17 1811)   famotidine (PF) (PEPCID) injection 20 mg (20 mg IntraVENous Given 12/24/17 2055)   acetaminophen (OFIRMEV) infusion 1,000 mg (1,000 mg IntraVENous New Bag 12/24/17 2055)   LORazepam (ATIVAN) tablet 0.5 mg (0.5 mg Oral Given 12/24/17 2055)         7:14 PM  Patient reports that her head pain has resolved as well as her nausea and vomiting, but still complains of severe abdominal pain.  Ordered GI cocktail to see if it helps resolve patient's abdominal pain.    Medical Decision Making   She remained stable during time in the emergency room, her pressure improved mildly during the time here.  She does have a history of hypertension, reports she did not take her blood pressure medications  today, to which she was educated she needs to take daily.    CBC with leukocytosis of 14.4 and 73.5% neutrophils, likely reactive due to patient's vomiting.  Had some mild decrease in 11.9 which appears relatively unchanged over the last 4 months.  CMP with mild hyponatremia at 135, and elevated protein 8.3 otherwise unremarkable CMP.  Lipase within normal limits.  UA with proteinuria,  ketones, and trace leuks, 1-4 epithelial cells, 5-9 white blood cells, 1-4 RBCs and 2+ bacteria but she denies urinary symptoms.  Urine culture ordered.  CT head directly visualized by Dr. Robina Ade with evidence of acute abnormality.    Patient received a normal saline bolus, IV Toradol, IV Phenergan, IV Benadryl, and pepcid IV for epigastric pain.  Reports that the Pepcid did not help her pain, ordered IV Tylenol and p.o. Ativan.  She reports after the IV Tylenol and the p.o. Ativan that her pain has been improved compared to arriving to ER.  Discussed the results with patient.  He is advised that she needs to follow-up with her GI doctor on Monday.    Final Diagnosis       ICD-10-CM ICD-9-CM   1. Abdominal pain, epigastric R10.13 789.06   2. Non-intractable vomiting with nausea, unspecified vomiting type R11.2 787.01   3. Acute non intractable tension-type headache G44.209 339.10       Disposition   Dicharge home  Current Discharge Medication List          The patient was personally evaluated by myself and Dr. Emeterio Reeve, Jeanine Luz, MD who agrees with the above assessment and plan.    Maxwell Marion, PA-C  December 24, 2017    My signature above authenticates this document and my orders, the final    diagnosis (es), discharge prescription (s), and instructions in the Epic    record.  If you have any questions please contact [phone].     Nursing notes have been reviewed by the physician/ advanced practice    Clinician.    Dragon medical dictation software was used for portions of this report.  Unintended voice recognition errors may occur.

## 2017-12-25 MED ORDER — ACETAMINOPHEN 1,000 MG/100 ML (10 MG/ML) IV
1000 mg/100 mL (10 mg/mL) | Freq: Once | INTRAVENOUS | Status: AC
Start: 2017-12-25 — End: 2017-12-24
  Administered 2017-12-25: 02:00:00 via INTRAVENOUS

## 2017-12-25 MED ORDER — SUCRALFATE 100 MG/ML ORAL SUSP
100 mg/mL | ORAL | Status: DC
Start: 2017-12-25 — End: 2017-12-24

## 2017-12-25 MED ORDER — LIDOCAINE 2 % MUCOSAL SOLN
2 % | Status: DC
Start: 2017-12-25 — End: 2017-12-24

## 2017-12-25 MED ORDER — LORAZEPAM 0.5 MG TAB
0.5 mg | ORAL | Status: AC
Start: 2017-12-25 — End: 2017-12-24
  Administered 2017-12-25: 02:00:00 via ORAL

## 2017-12-25 MED ORDER — FAMOTIDINE (PF) 20 MG/2 ML IV
20 mg/2 mL | INTRAVENOUS | Status: AC
Start: 2017-12-25 — End: 2017-12-24
  Administered 2017-12-25: 02:00:00 via INTRAVENOUS

## 2017-12-25 MED FILL — LIDOCAINE VISCOUS 2 % MUCOSAL SOLUTION: 2 % | Qty: 15

## 2017-12-25 MED FILL — LORAZEPAM 0.5 MG TAB: 0.5 mg | ORAL | Qty: 1

## 2017-12-25 MED FILL — OFIRMEV 1,000 MG/100 ML (10 MG/ML) INTRAVENOUS SOLUTION: 1000 mg/100 mL (10 mg/mL) | INTRAVENOUS | Qty: 100

## 2017-12-25 MED FILL — SUCRALFATE 100 MG/ML ORAL SUSP: 100 mg/mL | ORAL | Qty: 10

## 2017-12-25 MED FILL — FAMOTIDINE (PF) 20 MG/2 ML IV: 20 mg/2 mL | INTRAVENOUS | Qty: 2

## 2017-12-26 LAB — CULTURE, URINE
Isolate: >100000 — AB
Isolate: >100000 — AB

## 2018-02-02 LAB — HEMOGLOBIN A1C
Estimated Avg Glucose, External: 233 mg/dL — ABNORMAL HIGH (ref 91–123)
Hemoglobin A1C, External: 9.7 % — ABNORMAL HIGH (ref 4.8–5.6)

## 2018-02-03 LAB — HEMOGLOBIN A1C
Estimated Avg Glucose, External: 229 mg/dL — ABNORMAL HIGH (ref 91–123)
Hemoglobin A1C, External: 9.6 % — ABNORMAL HIGH (ref 4.8–5.6)

## 2018-02-24 ENCOUNTER — Inpatient Hospital Stay
Admit: 2018-02-24 | Discharge: 2018-02-25 | Disposition: A | Discharge status: Home / Self Care | Payer: MEDICARE | Attending: Emergency Medicine

## 2018-02-24 ENCOUNTER — Inpatient Hospital Stay
Admit: 2018-02-24 | Discharge: 2018-02-24 | Disposition: A | Discharge status: Home / Self Care | Payer: MEDICARE | Attending: Emergency Medicine

## 2018-02-24 DIAGNOSIS — E10649 Type 1 diabetes mellitus with hypoglycemia without coma: Secondary | ICD-10-CM

## 2018-02-24 DIAGNOSIS — R1115 Cyclical vomiting syndrome unrelated to migraine: Secondary | ICD-10-CM

## 2018-02-24 LAB — POC URINE MACROSCOPIC
Bilirubin, Urine: NEGATIVE
Bilirubin: NEGATIVE
Blood, Urine: NEGATIVE
Blood: NEGATIVE
Glucose, Ur: 500 mg/dl — AB
Glucose: 500 mg/dl — AB
Ketone: NEGATIVE mg/dl
Ketones, Urine: NEGATIVE mg/dl
Leukocyte Esterase, Urine: NEGATIVE
Leukocyte Esterase: NEGATIVE
Nitrite, Urine: NEGATIVE
Nitrites: NEGATIVE
Protein, UA: 30 mg/dl — AB
Protein: 30 mg/dl — AB
Specific Gravity, UA: 1.015 (ref 1.005–1.030)
Specific gravity: 1.015 (ref 1.005–1.030)
Urobilinogen, UA, POCT: 0.2 EU/dl (ref 0.0–1.0)
Urobilinogen: 0.2 EU/dl (ref 0.0–1.0)
pH (UA): 6.5 (ref 5–9)
pH, UA: 6.5 (ref 5–9)

## 2018-02-24 LAB — CBC WITH AUTO DIFFERENTIAL
Basophils %: 0.4 % (ref 0–3)
Eosinophils %: 0 % (ref 0–5)
Hematocrit: 37.8 % (ref 37.0–50.0)
Hemoglobin: 12.1 gm/dl — ABNORMAL LOW (ref 13.0–17.2)
Immature Granulocytes: 0.3 % (ref 0.0–3.0)
Lymphocytes %: 13.2 % — ABNORMAL LOW (ref 28–48)
MCH: 26.9 pg (ref 25.4–34.6)
MCHC: 32 gm/dl (ref 30.0–36.0)
MCV: 84 fL (ref 80.0–98.0)
MPV: 12.2 fL — ABNORMAL HIGH (ref 6.0–10.0)
Monocytes %: 2.8 % (ref 1–13)
Neutrophils %: 83.3 % — ABNORMAL HIGH (ref 34–64)
Nucleated RBCs: 0 (ref 0–0)
Platelets: 277 10*3/uL (ref 140–450)
RBC: 4.5 M/uL (ref 3.60–5.20)
RDW-SD: 43.6 (ref 36.4–46.3)
WBC: 12.2 10*3/uL — ABNORMAL HIGH (ref 4.0–11.0)

## 2018-02-24 LAB — POCT GLUCOSE: POC Glucose: 81 mg/dL (ref 65–105)

## 2018-02-24 LAB — BASIC METABOLIC PANEL
Anion Gap: 5 mmol/L (ref 5–15)
BUN: 11 mg/dl (ref 7–25)
CO2: 26 mEq/L (ref 21–32)
Calcium: 8.6 mg/dl (ref 8.5–10.1)
Chloride: 104 mEq/L (ref 98–107)
Creatinine: 1.1 mg/dl (ref 0.6–1.3)
EGFR IF NonAfrican American: >60
GFR African American: >60
Glucose: 159 mg/dl — ABNORMAL HIGH (ref 74–106)
Potassium: 3.6 mEq/L (ref 3.5–5.1)
Sodium: 135 mEq/L — ABNORMAL LOW (ref 136–145)

## 2018-02-24 LAB — POC HCG,URINE
HCG urine, QL: NEGATIVE
Pregnancy Test(Urn): NEGATIVE

## 2018-02-24 LAB — METABOLIC PANEL, BASIC
Anion gap: 5 mmol/L (ref 5–15)
BUN: 11 mg/dl (ref 7–25)
CO2: 26 mEq/L (ref 21–32)
Calcium: 8.6 mg/dl (ref 8.5–10.1)
Chloride: 104 mEq/L (ref 98–107)
Creatinine: 1.1 mg/dl (ref 0.6–1.3)
GFR est AA: >60
GFR est non-AA: >60
Glucose: 159 mg/dl — ABNORMAL HIGH (ref 74–106)
Potassium: 3.6 mEq/L (ref 3.5–5.1)
Sodium: 135 mEq/L — ABNORMAL LOW (ref 136–145)

## 2018-02-24 LAB — CBC WITH AUTOMATED DIFF
BASOPHILS: 0.4 % (ref 0–3)
EOSINOPHILS: 0 % (ref 0–5)
HCT: 37.8 % (ref 37.0–50.0)
HGB: 12.1 gm/dl — ABNORMAL LOW (ref 13.0–17.2)
IMMATURE GRANULOCYTES: 0.3 % (ref 0.0–3.0)
LYMPHOCYTES: 13.2 % — ABNORMAL LOW (ref 28–48)
MCH: 26.9 pg (ref 25.4–34.6)
MCHC: 32 gm/dl (ref 30.0–36.0)
MCV: 84 fL (ref 80.0–98.0)
MONOCYTES: 2.8 % (ref 1–13)
MPV: 12.2 fL — ABNORMAL HIGH (ref 6.0–10.0)
NEUTROPHILS: 83.3 % — ABNORMAL HIGH (ref 34–64)
NRBC: 0 (ref 0–0)
PLATELET: 277 10*3/uL (ref 140–450)
RBC: 4.5 M/uL (ref 3.60–5.20)
RDW-SD: 43.6 (ref 36.4–46.3)
WBC: 12.2 10*3/uL — ABNORMAL HIGH (ref 4.0–11.0)

## 2018-02-24 LAB — GLUCOSE, POC: Glucose (POC): 81 mg/dL (ref 65–105)

## 2018-02-24 MED ORDER — ACETAMINOPHEN 325 MG TABLET
325 mg | ORAL | Status: AC
Start: 2018-02-24 — End: 2018-02-24
  Administered 2018-02-24: 14:00:00 via ORAL

## 2018-02-24 MED FILL — TYLENOL 325 MG TABLET: 325 mg | ORAL | Qty: 3

## 2018-02-24 NOTE — ED Notes (Signed)
Attempted to get blood work unable to pull blood from existing IVE. Attempted to straight stick unsuccessfully.

## 2018-02-24 NOTE — Progress Notes (Signed)
ABG completed on room air    Results for CANDIDA, GOULDER (MRN [id number]) as of 02/24/2018 21:58   02/24/2018 21:48   pH 7.436   PCO2 29.4 (L)   PO2 116.0 (H)   BICARBONATE 19.8   O2 SAT 99.0   BASE EXCESS -4 (L)   SITE R Radial   Sample type Art   ALLENS TEST Pass   DEVICE Room Air   Patient temp. 98.4 F

## 2018-02-24 NOTE — ED Provider Notes (Signed)
[place]  Emergency Department Treatment Report    Patient: [name] [name] Age: 33 y.o. Sex: female    Date of Birth: April 28, 1985 Admit Date: 02/24/2018 PCP: Pecola Lawless, MD   MRN: [id number]  CSN: [id number]     Room: ER36/ER36 Time Dictated: 7:02 PM      Chief Complaint   Chief Complaint   Patient presents with   ??? Abdominal Pain   ??? Vomiting   ??? Nausea       History of Present Illness   34 y.o. female with PMH of IDDM, HTN, gastroparesis, and cyclical vomiting presents to the ED with severe vomiting since noon.  Seen here this morning with hypoglycemia with unremarkable labs.  No fevers.  Denies current pregnancy.  Denies cough/cold symptoms.  Has acute on chronic abdominal pain. "My whole body hurts".  Abdominal pain is "stabbing".  Vomited too many to count, which is described as "foam" and "liquid".  No diarrhea.  Has gastric stimulator.  Did not try any medications at home.  Had zofran in ambulance without improvement in symptoms.      Review of Systems   Constitutional: No fever, chills  ENT: No sore throat, runny nose   Respiratory: No cough, dyspnea or wheezing.  Cardiovascular: No chest pain  Gastrointestinal: as above  Genitourinary: No dysuria  Musculoskeletal: "whole body hurts"  Integumentary: No rashes.  Neurological: No headaches  Denies complaints in all other systems.   Past Medical/Surgical History     Past Medical History:   Diagnosis Date   ??? Cyclical vomiting 3/41/9622   ??? Diabetes ([place])    ??? Diabetic ketoacidosis without coma associated with type 1 diabetes mellitus ([place]) 11/05/2015   ??? Drug-seeking behavior    ??? Essential hypertension 03/27/2016    Amlodipine, carvedilol, lisinopril.   ??? Gastroparesis    ??? Gastroparesis    ??? Hypertension    ??? Insulin long-term use ([place]) 11/20/2016   ??? Slow transit constipation 08/08/2017   ??? Type 1 diabetes mellitus ([place]) 02/17/2009    Diagnosed at age 72. Had insulin pump at one time, but developed keloids at needle insertion sites. Was  supposed to establish with [facility].     Past Surgical History:   Procedure Laterality Date   ??? ABDOMEN SURGERY PROC UNLISTED  05/2015    Gastric stimulator pump    ??? HX CESAREAN SECTION         Social History     Social History     Socioeconomic History   ??? Marital status: SINGLE     Spouse name: Not on file   ??? Number of children: Not on file   ??? Years of education: Not on file   ??? Highest education level: Not on file   Tobacco Use   ??? Smoking status: Never Smoker   ??? Smokeless tobacco: Never Used   Substance and Sexual Activity   ??? Alcohol use: No   ??? Drug use: Yes     Types: Marijuana     Comment: last use 02/02/2018   ??? Sexual activity: Yes     Birth control/protection: Injection       Family History     Family History   Problem Relation Age of Onset   ??? Diabetes Father    ??? Stroke Other         great grandmother       Home Medications   (Not in a hospital admission)  Allergies     Allergies   Allergen Reactions   ??? Morphine Itching     Pt denies allergy   ??? Robaxin [Methocarbamol] Other (comments)     Body shaking, nausea   ??? Penicillins Hives   ??? Zithromax [Azithromycin] Hives       Physical Exam     ED Triage Vitals [02/24/18 1852]   Enc Vitals Group      BP       Pulse       Resp       Temp 99.1 ??F (37.3 ??C)      Temp src       SpO2       Weight       Height       Head Circumference       Peak Flow       Pain Score       Pain Loc       Pain Edu?       Excl. in [place]?      Constitutional: Patient constantly moving, groaning, and vomiting  HEENT: Conjunctiva clear  Respiratory: lungs clear to auscultation, nonlabored respirations. No tachypnea or accessory muscle use.  Cardiovascular: heart regular rate and rhythm without murmur rubs or gallops.   Gastrointestinal:  Abdomen soft, nontender  Musculoskeletal: no deformities noted; moves all extremities without difficulty  Integumentary: warm and dry without rashes or lesions  Neurologic: alert and oriented x 3; No facial asymmetry or  dysarthria.  Impression and Management Plan   33 year old female with PMH of IDDM and gastroparesis/cyclical vomiting presents to the ED with intractable vomiting.  Seen earlier today with hypoglycemia. We will check labs, medicate, and proceed accordingly.  I have reviewed labs, and haldol/benadryl has worked for her in the past.   Diagnostic Studies   Lab:      CBC WITH AUTOMATED DIFF    Collection Time: 02/24/18  7:15 PM   Result Value Ref Range    WBC 16.4 (H) 4.0 - 11.0 1000/mm3    RBC 4.24 3.60 - 5.20 M/uL    HGB 11.3 (L) 13.0 - 17.2 gm/dl    HCT 34.8 (L) 37.0 - 50.0 %    MCV 82.1 80.0 - 98.0 fL    MCH 26.7 25.4 - 34.6 pg    MCHC 32.5 30.0 - 36.0 gm/dl    PLATELET 267 140 - 450 1000/mm3    MPV 13.5 (H) 6.0 - 10.0 fL    RDW-SD 42.0 36.4 - 46.3      NRBC 0 0 - 0      IMMATURE GRANULOCYTES 0.4 0.0 - 3.0 %    NEUTROPHILS 85.2 (H) 34 - 64 %    LYMPHOCYTES 11.6 (L) 28 - 48 %    MONOCYTES 2.4 1 - 13 %    EOSINOPHILS 0.0 0 - 5 %    BASOPHILS 0.4 0 - 3 %   METABOLIC PANEL, COMPREHENSIVE    Collection Time: 02/24/18  7:15 PM   Result Value Ref Range    Sodium 133 (L) 136 - 145 mEq/L    Potassium 4.5 3.5 - 5.1 mEq/L    Chloride 104 98 - 107 mEq/L    CO2 19 (L) 21 - 32 mEq/L    Glucose 298 (H) 74 - 106 mg/dl    BUN 11 7 - 25 mg/dl    Creatinine 1.2 0.6 - 1.3 mg/dl    GFR est AA >60.0  GFR est non-AA 55      Calcium 9.0 8.5 - 10.1 mg/dl    AST (SGOT) 42 (H) 15 - 37 U/L    ALT (SGPT) 26 12 - 78 U/L    Alk. phosphatase 102 45 - 117 U/L    Bilirubin, total 0.9 0.2 - 1.0 mg/dl    Protein, total 8.2 6.4 - 8.2 gm/dl    Albumin 3.8 3.4 - 5.0 gm/dl    Anion gap 10 5 - 15 mmol/L   LIPASE    Collection Time: 02/24/18  7:15 PM   Result Value Ref Range    Lipase 68 (L) 73 - 393 U/L       Imaging:    No results found.    ED Course   Patient initially medicated with Haldol, Benadryl, and IV fluids.  She continued to complain of nausea with vomiting.  She was given Phenergan.  Her labs today reveal a decreased CO2 from 26 to 19.  Her  anion gap is currently 10.  However, since her CO2 has dropped, we will admit for further therapy.  Will start on Glucomander for DKA protocol.  Will discuss hospitalist.  ABG has been ordered.  Patient sent in stable condition.  Medications   PHARMACY INFORMATION NOTE (has no administration in time range)   insulin regular (MYXREDLIN) 100 units/100 ml NS infusion (premix) (has no administration in time range)   dextrose (D50) infusion 5-25 g (has no administration in time range)   glucagon (GLUCAGEN) injection 1 mg (has no administration in time range)   0.9% sodium chloride infusion 2,000 mL (has no administration in time range)   sodium chloride 0.9 % bolus infusion 1,000 mL (0 mL IntraVENous IV Completed 02/24/18 2035)   diphenhydrAMINE (BENADRYL) injection 25 mg (25 mg IntraVENous Given 02/24/18 1941)   haloperidol lactate (HALDOL) injection 2.5 mg (2.5 mg IntraVENous Given 02/24/18 1941)   ondansetron (ZOFRAN) injection 4 mg (4 mg IntraVENous Given 02/24/18 1941)   promethazine (PHENERGAN) 25 mg in NS IVPB (25 mg IntraVENous New Bag 02/24/18 2026)       Medical Decision Making     Final Diagnosis       ICD-10-CM ICD-9-CM   1. Intractable vomiting with nausea, unspecified vomiting type R11.2 536.2   2. IDDM (insulin dependent diabetes mellitus) ([place]) E11.9 250.00    Z79.4 V58.67     Disposition   Patient admitted in stable condition.       Payor: [place] / Plan: [place] / Product Type: Medicare /   Minna Merritts, MD  February 24, 2018    My signature above authenticates this document and my orders, the final ??  diagnosis (es), discharge prescription (s), and instructions in the Epic ??  record.  If you have any questions please contact [phone].  ??  Nursing notes have been reviewed by the physician/ advanced practice ??  Clinician.    Dragon medical dictation software was used for portions of this report. Unintended voice recognition errors may occur.

## 2018-02-24 NOTE — ED Notes (Signed)
Pt in via EMS  From home  Pt was d/c today from ED  Pt c/o nausea and abd pain.  Blood glucose 303  Pt given 4mg  zofran by EMS  HTN- BP 210/100 per EMS

## 2018-02-24 NOTE — ED Notes (Signed)
SBAR report given to Ta hesha, RN.

## 2018-02-24 NOTE — ED Provider Notes (Signed)
ED  Provider Notes by Merian Capron, PA at 02/24/18 1610                Author: Merian Capron, PA  Service: Emergency Medicine  Author Type: Physician Assistant       Filed: 02/24/18 1453  Date of Service: 02/24/18 0903  Status: Attested           Editor: Merian Capron, PA (Physician Assistant)  Cosigner: Konrad Felix, MD at 02/27/18 [phone]          Attestation signed by Konrad Felix, MD at 02/27/18 1405          I, Dr. Haze Justin, have personally seen and examined this patient; I have fully participated in the care of this patient with the advanced practice provider.   I have reviewed and agree with all pertinent clinical information including history, physical exam, labs, and the plan.  I have also reviewed and agree with the medications, allergies and past medical history sections for this patient.  Patient presents  with complaints of a frontal headache, had an episode of altered level of consciousness associated with hypoglycemia this morning, patient with a history of diabetes mellitus, the time of my evaluation patient feels much better with a normal neurologic  exam, I gave her something to eat, her blood sugar is improved and she is instructed to follow-up closely with her doctor, I discussed with her the need to make sure she keeps up with all her meals and snacks and to monitor her blood sugar closely.     Konrad Felix, MD                                   [facility]   Emergency Department Treatment Report          Patient: [name] [name]  Age: 33 y.o.  Sex: female          Date of Birth: 06/19/1985  Admit Date: 02/24/2018  PCP: Lyndon Code, MD     MRN: [id number]   CSN: [id number]   ATTENDING: Haze Justin, MD         Room: ER35/ER35  Time Dictated: 9:03 AM  APP: Merian Capron, PA-C        Chief Complaint      Chief Complaint       Patient presents with        ?  Low Blood Sugar             History of Present Illness     33 y.o. female  with a history of HTN and type 1 DM  presenting to the ED for evaluation of hypoglycemia.  Patient reports that she went to bed last night feeling fine and the first thing she remembers this morning was being in the ambulance. Only current complaint is  a bifrontal headache that is 9 out of 10 in severity. She reports a history of headaches when her blood sugar gets very low.       Per EMS, they were contacted by family members when the patient was confused and minimally responsive this morning.  Blood sugar was 49 on arrival, she was able to take oral glucose 15g and D50 was started, recheck blood sugar 56, on arrival blood sugar  81.  She reports that she last checked her sugar at home around 6 PM yesterday evening, it  was 102, she gave her usual long acting insulin with 3 units novolog and then had dinner of collard greens, green beans, and baked chicken around 8 PM.       She reports a sore throat several days ago that is now resolved, otherwise has been feeling fine. Denies any irritative voiding symptoms, GI symptoms, or fevers.        Review of Systems     Constitutional:  No fever, chills, body aches   ENT: No sore throat or runny nose.   Respiratory: No cough, shortness of breath, or wheezing   Cardiovascular: No chest pain or palpitations   Gastrointestinal: No abdominal pain, nausea, vomiting, diarrhea, or constipation   Genitourinary: No dysuria or frequency.   Musculoskeletal: No swelling or joint pain   Integumentary: No rashes or lesions   Hematologic: No bleeding/bruising complaints   Neurological: +headache. No dizziness.        Past Medical/Surgical History          Past Medical History:        Diagnosis  Date         ?  Cyclical vomiting  07/09/2017     ?  Diabetes (HCC)       ?  Diabetic ketoacidosis without coma associated with type 1 diabetes mellitus (HCC)  11/05/2015     ?  Drug-seeking behavior       ?  Essential hypertension  03/27/2016          Amlodipine, carvedilol, lisinopril.         ?  Gastroparesis       ?  Gastroparesis        ?  Hypertension       ?  Insulin long-term use (HCC)  11/20/2016     ?  Slow transit constipation  08/08/2017     ?  Type 1 diabetes mellitus (HCC)  02/17/2009          Diagnosed at age 88. Had insulin pump at one time, but developed keloids at needle insertion sites. Was supposed to establish with [facility].          Past Surgical History:         Procedure  Laterality  Date          ?  ABDOMEN SURGERY PROC UNLISTED    05/2015          Gastric stimulator pump           ?  HX CESAREAN SECTION                 Social History          Social History          Socioeconomic History         ?  Marital status:  SINGLE              Spouse name:  Not on file         ?  Number of children:  Not on file     ?  Years of education:  Not on file     ?  Highest education level:  Not on file       Tobacco Use         ?  Smoking status:  Never Smoker     ?  Smokeless tobacco:  Never Used       Substance and Sexual Activity         ?  Alcohol use:  No     ?  Drug use:  Yes              Types:  Marijuana             Comment: last use 02/02/2018         ?  Sexual activity:  Yes              Birth control/protection:  Injection             Family History          Family History         Problem  Relation  Age of Onset          ?  Diabetes  Father       ?  Stroke  Other                great grandmother             Current Medications          Prior to Admission Medications     Prescriptions  Last Dose  Informant  Patient Reported?  Taking?      insulin aspart U-100 (NOVOLOG FLEXPEN U-100 INSULIN) 100 unit/mL (3 mL) inpn      Yes  No      Sig: Inject subcutaneously per sliding scale 3 times a day.      insulin glargine (LANTUS,BASAGLAR) 100 unit/mL (3 mL) inpn      Yes  No      Sig: 28 Units by SubCUTAneous route daily (with lunch).               Facility-Administered Medications: None             Allergies          Allergies        Allergen  Reactions         ?  Morphine  Itching             Pt denies allergy         ?  Robaxin  [Methocarbamol]  Other (comments)             Body shaking, nausea         ?  Penicillins  Hives         ?  Zithromax [Azithromycin]  Hives             Physical Exam          ED Triage Vitals [02/24/18 0856]     ED Encounter Vitals Group           BP  (!) 196/104        Pulse (Heart Rate)  93        Resp Rate  10        Temp  97.6 ??F (36.4 ??C)        Temp src          O2 Sat (%)  100 %        Weight  114 lb           Height  5\' 6"         Constitutional: Pleasant thin female, lying on stretcher, fully alert, in no distress. Answering questions appropriately.   HEENT: Conjunctiva clear.  PERRL. Mucous membranes moist, non-erythematous. Surface of the pharynx, palate, and tongue are pink, moist and  without lesions.   Neck: supple, non tender,  symmetrical, no masses or lymphadenopathy    Respiratory: lungs clear to auscultation, nonlabored respirations. No tachypnea or accessory muscle use.   Cardiovascular: heart regular rate and rhythm without murmur rubs or gallops.      Gastrointestinal:  Soft, non-tender, non-distended, normoactive bowel sounds   Musculoskeletal: Calves soft and non-tender. No peripheral edema or significant varicosities.   Pulses:  Radial and DP pulses 2+ and equal bilaterally.    Integumentary: warm and dry, no jaundice, no rashes or lesions   Neurologic: alert and oriented x 4, strength 5/5 and symmetric throughout.         Impression and Management Plan     33 year old type 1 diabetic presenting via EMS for low blood sugar with altered consciousness this morning.  On examination, she is fully alert and oriented, hypertensive,  other vitals within normal limits, heart is regular, lungs are clear, abdomen soft and non-tender.  Will obtain basic labs and continue to monitor blood sugar levels.        Diagnostic Studies     Lab:      Recent Results (from the past 12 hour(s))     GLUCOSE, POC          Collection Time: 02/24/18  8:47 AM         Result  Value  Ref Range            Glucose (POC)  81   65 - 105 mg/dL       POC URINE MACROSCOPIC          Collection Time: 02/24/18  9:27 AM         Result  Value  Ref Range            Glucose  500 (A)  NEGATIVE,Negative mg/dl       Bilirubin  Negative  NEGATIVE,Negative         Ketone  Negative  NEGATIVE,Negative mg/dl       Specific gravity  1.015  1.005 - 1.030         Blood  Negative  NEGATIVE,Negative         pH (UA)  6.5  5 - 9         Protein  30 (A)  NEGATIVE,Negative mg/dl       Urobilinogen  0.2  0.0 - 1.0 EU/dl       Nitrites  Negative  NEGATIVE,Negative         Leukocyte Esterase  Negative  NEGATIVE,Negative         Color  Light yellow          Appearance  Slightly Cloudy          POC HCG,URINE          Collection Time: 02/24/18  9:32 AM         Result  Value  Ref Range            HCG urine, QL  negative  NEGATIVE,Negative,negative         CBC WITH AUTOMATED DIFF          Collection Time: 02/24/18  9:32 AM         Result  Value  Ref Range            WBC  12.2 (H)  4.0 - 11.0 1000/mm3       RBC  4.50  3.60 - 5.20 M/uL            HGB  12.1 (L)  13.0 - 17.2 gm/dl            HCT  62.0  35.5 - 50.0 %       MCV  84.0  80.0 - 98.0 fL       MCH  26.9  25.4 - 34.6 pg       MCHC  32.0  30.0 - 36.0 gm/dl       PLATELET  974  163 - 450 1000/mm3       MPV  12.2 (H)  6.0 - 10.0 fL       RDW-SD  43.6  36.4 - 46.3         NRBC  0  0 - 0         IMMATURE GRANULOCYTES  0.3  0.0 - 3.0 %       NEUTROPHILS  83.3 (H)  34 - 64 %       LYMPHOCYTES  13.2 (L)  28 - 48 %       MONOCYTES  2.8  1 - 13 %       EOSINOPHILS  0.0  0 - 5 %       BASOPHILS  0.4  0 - 3 %       METABOLIC PANEL, BASIC          Collection Time: 02/24/18  9:32 AM         Result  Value  Ref Range            Sodium  135 (L)  136 - 145 mEq/L       Potassium  3.6  3.5 - 5.1 mEq/L       Chloride  104  98 - 107 mEq/L       CO2  26  21 - 32 mEq/L       Glucose  159 (H)  74 - 106 mg/dl       BUN  11  7 - 25 mg/dl       Creatinine  1.1  0.6 - 1.3 mg/dl       GFR est AA  >84.5          GFR est non-AA  >60          Calcium   8.6  8.5 - 10.1 mg/dl            Anion gap  5  5 - 15 mmol/L        Imaging:     No results found.           ED Course/Medical Decision Making     Patient with a slight leukocytosis, white blood cell count today is 12.2, hemoglobin is 12.1, hematocrit is 37.8, remainder of CBC is unremarkable.  Patient is not  pregnant.  Urinalysis with some glucose and protein, negative for nitrites or leukocyte esterase, patient denies irritative voiding symptoms.  Electrolytes are grossly within normal limits, formal glucose on BMP is 159, creatinine 1.1, BUN is 11.  Patient  remained asymptomatic during her time in the emergency department, she was medicated with acetaminophen for her headache with good result.  She ate a boxed lunch without difficulty.  She remained hemodynamically stable, was instructed to keep close watch  on her blood sugar, to return with new or worsening symptoms, otherwise to contact her primary care physician to schedule follow-up in the near future.  She was discharged home in good condition.  Final Diagnosis                 ICD-10-CM  ICD-9-CM          1.  Hypoglycemia  E16.2  251.2             Disposition     Discharged home      [name] [name], [place]   February 24, 2018      The patient was personally evaluated by myself and Dr. Henrene HawkingSiegel who agrees with the above assessment and plan.      My signature above authenticates this document and my orders, the final diagnosis (es), discharge prescription (s), and instructions in the Epic record. If you have any questions please contact [phone].   ??   Nursing notes have been reviewed by the physician/ advanced practice clinician.

## 2018-02-24 NOTE — ED Notes (Signed)
Per EMS -     Patient from home, family called EMS for initial blood sugar 49, patient was alert and able to take oral glucose 15g.    #22 Right AC  D50 wide open    Glucose rechecked 56 at 0834    Patient is alert and oriented upon arrival to ED.     Accuckeck upon arrival to ED 81

## 2018-02-24 NOTE — ED Notes (Signed)
Pt eating box lunch and given apple juice per request.

## 2018-02-24 NOTE — ED Triage Notes (Signed)
Pt in via EMS  From home  Pt was d/c today from ED  Pt c/o nausea and abd pain.  Blood glucose 303  Pt given 4mg zofran by EMS  HTN- BP 210/100 per EMS

## 2018-02-24 NOTE — Progress Notes (Signed)
ABG completed on room air    Results for [name], [name] S (MRN [id number]) as of 02/24/2018 21:58   02/24/2018 21:48   pH 7.436   PCO2 29.4 (L)   PO2 116.0 (H)   BICARBONATE 19.8   O2 SAT 99.0   BASE EXCESS -4 (L)   SITE R Radial   Sample type Art   ALLENS TEST Pass   DEVICE Room Air   Patient temp. 98.4 F

## 2018-02-24 NOTE — ED Provider Notes (Signed)
[place]  Emergency Department Treatment Report    Patient: [name] [name] Age: 33 y.o. Sex: female    Date of Birth: [birth year] Admit Date: 02/24/2018 PCP: Pecola Lawless, MD   MRN: [id number]  CSN: [id number]     Room: ER36/ER36 Time Dictated: 7:02 PM      Chief Complaint   Chief Complaint   Patient presents with   ??? Abdominal Pain   ??? Vomiting   ??? Nausea       History of Present Illness   33 y.o. female with PMH of IDDM, HTN, gastroparesis, and cyclical vomiting presents to the ED with severe vomiting since noon.  Seen here this morning with hypoglycemia with unremarkable labs.  No fevers.  Denies current pregnancy.  Denies cough/cold symptoms.  Has acute on chronic abdominal pain. "My whole body hurts".  Abdominal pain is "stabbing".  Vomited too many to count, which is described as "foam" and "liquid".  No diarrhea.  Has gastric stimulator.  Did not try any medications at home.  Had zofran in ambulance without improvement in symptoms.      Review of Systems   Constitutional: No fever, chills  ENT: No sore throat, runny nose   Respiratory: No cough, dyspnea or wheezing.  Cardiovascular: No chest pain  Gastrointestinal: as above  Genitourinary: No dysuria  Musculoskeletal: "whole body hurts"  Integumentary: No rashes.  Neurological: No headaches  Denies complaints in all other systems.   Past Medical/Surgical History     Past Medical History:   Diagnosis Date   ??? Cyclical vomiting 7/82/9562   ??? Diabetes ([place])    ??? Diabetic ketoacidosis without coma associated with type 1 diabetes mellitus ([place]) 11/05/2015   ??? Drug-seeking behavior    ??? Essential hypertension 03/27/2016    Amlodipine, carvedilol, lisinopril.   ??? Gastroparesis    ??? Gastroparesis    ??? Hypertension    ??? Insulin long-term use ([place]) 11/20/2016   ??? Slow transit constipation 08/08/2017   ??? Type 1 diabetes mellitus ([place]) 02/17/2009    Diagnosed at age 28. Had insulin pump at one time, but developed keloids  at needle insertion sites. Was supposed to establish with [facility].     Past Surgical History:   Procedure Laterality Date   ??? ABDOMEN SURGERY PROC UNLISTED  05/2015    Gastric stimulator pump    ??? HX CESAREAN SECTION         Social History     Social History     Socioeconomic History   ??? Marital status: SINGLE     Spouse name: Not on file   ??? Number of children: Not on file   ??? Years of education: Not on file   ??? Highest education level: Not on file   Tobacco Use   ??? Smoking status: Never Smoker   ??? Smokeless tobacco: Never Used   Substance and Sexual Activity   ??? Alcohol use: No   ??? Drug use: Yes     Types: Marijuana     Comment: last use 02/02/2018   ??? Sexual activity: Yes     Birth control/protection: Injection       Family History     Family History   Problem Relation Age of Onset   ??? Diabetes Father    ??? Stroke Other         great grandmother       Home Medications   (Not in a hospital admission)  Allergies     Allergies   Allergen Reactions   ??? Morphine Itching     Pt denies allergy   ??? Robaxin [Methocarbamol] Other (comments)     Body shaking, nausea   ??? Penicillins Hives   ??? Zithromax [Azithromycin] Hives       Physical Exam     ED Triage Vitals [02/24/18 1852]   Enc Vitals Group      BP       Pulse       Resp       Temp 99.1 ??F (37.3 ??C)      Temp src       SpO2       Weight       Height       Head Circumference       Peak Flow       Pain Score       Pain Loc       Pain Edu?       Excl. in [place]?      Constitutional: Patient constantly moving, groaning, and vomiting  HEENT: Conjunctiva clear  Respiratory: lungs clear to auscultation, nonlabored respirations. No tachypnea or accessory muscle use.  Cardiovascular: heart regular rate and rhythm without murmur rubs or gallops.   Gastrointestinal:  Abdomen soft, nontender  Musculoskeletal: no deformities noted; moves all extremities without difficulty  Integumentary: warm and dry without rashes or lesions   Neurologic: alert and oriented x 3; No facial asymmetry or dysarthria.  Impression and Management Plan   33 year old female with PMH of IDDM and gastroparesis/cyclical vomiting presents to the ED with intractable vomiting.  Seen earlier today with hypoglycemia. We will check labs, medicate, and proceed accordingly.  I have reviewed labs, and haldol/benadryl has worked for her in the past.   Diagnostic Studies   Lab:      CBC WITH AUTOMATED DIFF    Collection Time: 02/24/18  7:15 PM   Result Value Ref Range    WBC 16.4 (H) 4.0 - 11.0 1000/mm3    RBC 4.24 3.60 - 5.20 M/uL    HGB 11.3 (L) 13.0 - 17.2 gm/dl    HCT 34.8 (L) 37.0 - 50.0 %    MCV 82.1 80.0 - 98.0 fL    MCH 26.7 25.4 - 34.6 pg    MCHC 32.5 30.0 - 36.0 gm/dl    PLATELET 267 140 - 450 1000/mm3    MPV 13.5 (H) 6.0 - 10.0 fL    RDW-SD 42.0 36.4 - 46.3      NRBC 0 0 - 0      IMMATURE GRANULOCYTES 0.4 0.0 - 3.0 %    NEUTROPHILS 85.2 (H) 34 - 64 %    LYMPHOCYTES 11.6 (L) 28 - 48 %    MONOCYTES 2.4 1 - 13 %    EOSINOPHILS 0.0 0 - 5 %    BASOPHILS 0.4 0 - 3 %   METABOLIC PANEL, COMPREHENSIVE    Collection Time: 02/24/18  7:15 PM   Result Value Ref Range    Sodium 133 (L) 136 - 145 mEq/L    Potassium 4.5 3.5 - 5.1 mEq/L    Chloride 104 98 - 107 mEq/L    CO2 19 (L) 21 - 32 mEq/L    Glucose 298 (H) 74 - 106 mg/dl    BUN 11 7 - 25 mg/dl    Creatinine 1.2 0.6 - 1.3 mg/dl    GFR est AA >60.0  GFR est non-AA 55      Calcium 9.0 8.5 - 10.1 mg/dl    AST (SGOT) 42 (H) 15 - 37 U/L    ALT (SGPT) 26 12 - 78 U/L    Alk. phosphatase 102 45 - 117 U/L    Bilirubin, total 0.9 0.2 - 1.0 mg/dl    Protein, total 8.2 6.4 - 8.2 gm/dl    Albumin 3.8 3.4 - 5.0 gm/dl    Anion gap 10 5 - 15 mmol/L   LIPASE    Collection Time: 02/24/18  7:15 PM   Result Value Ref Range    Lipase 68 (L) 73 - 393 U/L       Imaging:    No results found.    ED Course   Patient initially medicated with Haldol, Benadryl, and IV fluids.  She  continued to complain of nausea with vomiting.  She was given Phenergan.  Her labs today reveal a decreased CO2 from 26 to 19.  Her anion gap is currently 10.  However, since her CO2 has dropped, we will admit for further therapy.  Will start on Glucomander for DKA protocol.  Will discuss hospitalist.  ABG has been ordered.  Patient sent in stable condition.  Medications   PHARMACY INFORMATION NOTE (has no administration in time range)   insulin regular (MYXREDLIN) 100 units/100 ml NS infusion (premix) (has no administration in time range)   dextrose (D50) infusion 5-25 g (has no administration in time range)   glucagon (GLUCAGEN) injection 1 mg (has no administration in time range)   0.9% sodium chloride infusion 2,000 mL (has no administration in time range)   sodium chloride 0.9 % bolus infusion 1,000 mL (0 mL IntraVENous IV Completed 02/24/18 2035)   diphenhydrAMINE (BENADRYL) injection 25 mg (25 mg IntraVENous Given 02/24/18 1941)   haloperidol lactate (HALDOL) injection 2.5 mg (2.5 mg IntraVENous Given 02/24/18 1941)   ondansetron (ZOFRAN) injection 4 mg (4 mg IntraVENous Given 02/24/18 1941)   promethazine (PHENERGAN) 25 mg in NS IVPB (25 mg IntraVENous New Bag 02/24/18 2026)       Medical Decision Making     Final Diagnosis       ICD-10-CM ICD-9-CM   1. Intractable vomiting with nausea, unspecified vomiting type R11.2 536.2   2. IDDM (insulin dependent diabetes mellitus) ([place]) E11.9 250.00    Z79.4 V58.67     Disposition   Patient admitted in stable condition.       Payor: [place] MED[place] / Plan: [place] / Product Type: Medicare /   Minna Merritts, MD  February 24, 2018    My signature above authenticates this document and my orders, the final ??  diagnosis (es), discharge prescription (s), and instructions in the Epic ??  record.  If you have any questions please contact [phone].  ??  Nursing notes have been reviewed by the physician/ advanced practice ??  Clinician.     Dragon medical dictation software was used for portions of this report. Unintended voice recognition errors may occur.

## 2018-02-24 NOTE — ED Triage Notes (Addendum)
Per EMS -     Patient from home, family called EMS for initial blood sugar 49, patient was alert and able to take oral glucose 15g.    #22 Right AC  D50 wide open 250ml    Glucose rechecked 56 at 0834    Patient is alert and oriented upon arrival to ED.     Accuckeck upon arrival to ED 81

## 2018-02-24 NOTE — ED Notes (Signed)
SBAR report given to Ta hesha, RN.

## 2018-02-24 NOTE — ED Notes (Signed)
Pt eating box lunch and given apple juice per request.

## 2018-02-24 NOTE — ED Provider Notes (Signed)
[facility] Care  Emergency Department Treatment Report    Patient: [name] [name] Age: 33 y.o. Sex: female    Date of Birth: 07/09/1985 Admit Date: 02/24/2018 PCP: Lyndon Code, MD   MRN: [id number]  CSN: [id number]  ATTENDING: Haze Justin, MD   Room: ER35/ER35 Time Dictated: 9:03 AM APP: Merian Capron, PA-C     Chief Complaint   Chief Complaint   Patient presents with   ??? Low Blood Sugar       History of Present Illness   33 y.o. female with a history of HTN and type 1 DM presenting to the ED for evaluation of hypoglycemia.  Patient reports that she went to bed last night feeling fine and the first thing she remembers this morning was being in the ambulance. Only current complaint is a bifrontal headache that is 9 out of 10 in severity. She reports a history of headaches when her blood sugar gets very low.     Per EMS, they were contacted by family members when the patient was confused and minimally responsive this morning.  Blood sugar was 49 on arrival, she was able to take oral glucose 15g and D50 was started, recheck blood sugar 56, on arrival blood sugar 81.  She reports that she last checked her sugar at home around 6 PM yesterday evening, it was 102, she gave her usual long acting insulin with 3 units novolog and then had dinner of collard greens, green beans, and baked chicken around 8 PM.     She reports a sore throat several days ago that is now resolved, otherwise has been feeling fine. Denies any irritative voiding symptoms, GI symptoms, or fevers.    Review of Systems   Constitutional:  No fever, chills, body aches  ENT: No sore throat or runny nose.  Respiratory: No cough, shortness of breath, or wheezing  Cardiovascular: No chest pain or palpitations  Gastrointestinal: No abdominal pain, nausea, vomiting, diarrhea, or constipation  Genitourinary: No dysuria or frequency.  Musculoskeletal: No swelling or joint pain  Integumentary: No rashes or lesions   Hematologic: No bleeding/bruising complaints  Neurological: +headache. No dizziness.    Past Medical/Surgical History     Past Medical History:   Diagnosis Date   ??? Cyclical vomiting 07/09/2017   ??? Diabetes (HCC)    ??? Diabetic ketoacidosis without coma associated with type 1 diabetes mellitus (HCC) 11/05/2015   ??? Drug-seeking behavior    ??? Essential hypertension 03/27/2016    Amlodipine, carvedilol, lisinopril.   ??? Gastroparesis    ??? Gastroparesis    ??? Hypertension    ??? Insulin long-term use (HCC) 11/20/2016   ??? Slow transit constipation 08/08/2017   ??? Type 1 diabetes mellitus (HCC) 02/17/2009    Diagnosed at age 9. Had insulin pump at one time, but developed keloids at needle insertion sites. Was supposed to establish with [facility].     Past Surgical History:   Procedure Laterality Date   ??? ABDOMEN SURGERY PROC UNLISTED  05/2015    Gastric stimulator pump    ??? HX CESAREAN SECTION         Social History     Social History     Socioeconomic History   ??? Marital status: SINGLE     Spouse name: Not on file   ??? Number of children: Not on file   ??? Years of education: Not on file   ??? Highest education level: Not on file   Tobacco Use   ???  Smoking status: Never Smoker   ??? Smokeless tobacco: Never Used   Substance and Sexual Activity   ??? Alcohol use: No   ??? Drug use: Yes     Types: Marijuana     Comment: last use 02/02/2018   ??? Sexual activity: Yes     Birth control/protection: Injection       Family History     Family History   Problem Relation Age of Onset   ??? Diabetes Father    ??? Stroke Other         great grandmother       Current Medications     Prior to Admission Medications   Prescriptions Last Dose Informant Patient Reported? Taking?   insulin aspart U-100 (NOVOLOG FLEXPEN U-100 INSULIN) 100 unit/mL (3 mL) inpn   Yes No   Sig: Inject subcutaneously per sliding scale 3 times a day.   insulin glargine (LANTUS,BASAGLAR) 100 unit/mL (3 mL) inpn   Yes No   Sig: 28 Units by SubCUTAneous route daily (with lunch).       Facility-Administered Medications: None       Allergies     Allergies   Allergen Reactions   ??? Morphine Itching     Pt denies allergy   ??? Robaxin [Methocarbamol] Other (comments)     Body shaking, nausea   ??? Penicillins Hives   ??? Zithromax [Azithromycin] Hives       Physical Exam     ED Triage Vitals [02/24/18 0856]   ED Encounter Vitals Group      BP (!) 196/104      Pulse (Heart Rate) 93      Resp Rate 10      Temp 97.6 ??F (36.4 ??C)      Temp src       O2 Sat (%) 100 %      Weight 114 lb      Height 5\' 6"      Constitutional: Pleasant thin female, lying on stretcher, fully alert, in no distress. Answering questions appropriately.  HEENT: Conjunctiva clear.  PERRL. Mucous membranes moist, non-erythematous. Surface of the pharynx, palate, and tongue are pink, moist and without lesions.  Neck: supple, non tender, symmetrical, no masses or lymphadenopathy   Respiratory: lungs clear to auscultation, nonlabored respirations. No tachypnea or accessory muscle use.  Cardiovascular: heart regular rate and rhythm without murmur rubs or gallops.     Gastrointestinal:  Soft, non-tender, non-distended, normoactive bowel sounds  Musculoskeletal: Calves soft and non-tender. No peripheral edema or significant varicosities.  Pulses:  Radial and DP pulses 2+ and equal bilaterally.   Integumentary: warm and dry, no jaundice, no rashes or lesions  Neurologic: alert and oriented x 4, strength 5/5 and symmetric throughout.     Impression and Management Plan   33 year old type 1 diabetic presenting via EMS for low blood sugar with altered consciousness this morning.  On examination, she is fully alert and oriented, hypertensive, other vitals within normal limits, heart is regular, lungs are clear, abdomen soft and non-tender.  Will obtain basic labs and continue to monitor blood sugar levels.    Diagnostic Studies   Lab:   Recent Results (from the past 12 hour(s))   GLUCOSE, POC    Collection Time: 02/24/18  8:47 AM    Result Value Ref Range    Glucose (POC) 81 65 - 105 mg/dL   POC URINE MACROSCOPIC    Collection Time: 02/24/18  9:27 AM   Result Value  Ref Range    Glucose 500 (A) NEGATIVE,Negative mg/dl    Bilirubin Negative NEGATIVE,Negative      Ketone Negative NEGATIVE,Negative mg/dl    Specific gravity 9.381 1.005 - 1.030      Blood Negative NEGATIVE,Negative      pH (UA) 6.5 5 - 9      Protein 30 (A) NEGATIVE,Negative mg/dl    Urobilinogen 0.2 0.0 - 1.0 EU/dl    Nitrites Negative NEGATIVE,Negative      Leukocyte Esterase Negative NEGATIVE,Negative      Color Light yellow      Appearance Slightly Cloudy     POC HCG,URINE    Collection Time: 02/24/18  9:32 AM   Result Value Ref Range    HCG urine, QL negative NEGATIVE,Negative,negative     CBC WITH AUTOMATED DIFF    Collection Time: 02/24/18  9:32 AM   Result Value Ref Range    WBC 12.2 (H) 4.0 - 11.0 1000/mm3    RBC 4.50 3.60 - 5.20 M/uL    HGB 12.1 (L) 13.0 - 17.2 gm/dl    HCT 01.7 51.0 - 25.8 %    MCV 84.0 80.0 - 98.0 fL    MCH 26.9 25.4 - 34.6 pg    MCHC 32.0 30.0 - 36.0 gm/dl    PLATELET 527 782 - 423 1000/mm3    MPV 12.2 (H) 6.0 - 10.0 fL    RDW-SD 43.6 36.4 - 46.3      NRBC 0 0 - 0      IMMATURE GRANULOCYTES 0.3 0.0 - 3.0 %    NEUTROPHILS 83.3 (H) 34 - 64 %    LYMPHOCYTES 13.2 (L) 28 - 48 %    MONOCYTES 2.8 1 - 13 %    EOSINOPHILS 0.0 0 - 5 %    BASOPHILS 0.4 0 - 3 %   METABOLIC PANEL, BASIC    Collection Time: 02/24/18  9:32 AM   Result Value Ref Range    Sodium 135 (L) 136 - 145 mEq/L    Potassium 3.6 3.5 - 5.1 mEq/L    Chloride 104 98 - 107 mEq/L    CO2 26 21 - 32 mEq/L    Glucose 159 (H) 74 - 106 mg/dl    BUN 11 7 - 25 mg/dl    Creatinine 1.1 0.6 - 1.3 mg/dl    GFR est AA >53.6      GFR est non-AA >60      Calcium 8.6 8.5 - 10.1 mg/dl    Anion gap 5 5 - 15 mmol/L     Imaging:    No results found.      ED Course/Medical Decision Making   Patient with a slight leukocytosis, white blood cell count today is 12.2,  hemoglobin is 12.1, hematocrit is 37.8, remainder of CBC is unremarkable.  Patient is not pregnant.  Urinalysis with some glucose and protein, negative for nitrites or leukocyte esterase, patient denies irritative voiding symptoms.  Electrolytes are grossly within normal limits, formal glucose on BMP is 159, creatinine 1.1, BUN is 11.  Patient remained asymptomatic during her time in the emergency department, she was medicated with acetaminophen for her headache with good result.  She ate a boxed lunch without difficulty.  She remained hemodynamically stable, was instructed to keep close watch on her blood sugar, to return with new or worsening symptoms, otherwise to contact her primary care physician to schedule follow-up in the near future.  She was discharged home in good condition.  Final Diagnosis       ICD-10-CM ICD-9-CM   1. Hypoglycemia E16.2 251.2       Disposition   Discharged home    [place], [place]  February 24, 2018    The patient was personally evaluated by myself and Dr. Henrene Hawking who agrees with the above assessment and plan.    My signature above authenticates this document and my orders, the final diagnosis (es), discharge prescription (s), and instructions in the Epic record. If you have any questions please contact [phone].  ??  Nursing notes have been reviewed by the physician/ advanced practice clinician.

## 2018-02-24 NOTE — ED Notes (Signed)
Attempted to get blood work unable to pull blood from existing IVE. Attempted to straight stick unsuccessfully.

## 2018-02-25 LAB — CBC WITH AUTO DIFFERENTIAL
Basophils %: 0.4 % (ref 0–3)
Basophils %: 0.1 % (ref 0–3)
Eosinophils %: 0 % (ref 0–5)
Eosinophils %: 0 % (ref 0–5)
Hematocrit: 34.8 % — ABNORMAL LOW (ref 37.0–50.0)
Hematocrit: 34.9 % — ABNORMAL LOW (ref 37.0–50.0)
Hemoglobin: 11.3 gm/dl — ABNORMAL LOW (ref 13.0–17.2)
Hemoglobin: 11.2 gm/dl — ABNORMAL LOW (ref 13.0–17.2)
Immature Granulocytes: 0.4 % (ref 0.0–3.0)
Immature Granulocytes: 0.6 % (ref 0.0–3.0)
Lymphocytes %: 11.6 % — ABNORMAL LOW (ref 28–48)
Lymphocytes %: 6.5 % — ABNORMAL LOW (ref 28–48)
MCH: 26.7 pg (ref 25.4–34.6)
MCH: 26.9 pg (ref 25.4–34.6)
MCHC: 32.5 gm/dl (ref 30.0–36.0)
MCHC: 32.1 gm/dl (ref 30.0–36.0)
MCV: 82.1 fL (ref 80.0–98.0)
MCV: 83.7 fL (ref 80.0–98.0)
MPV: 13.5 fL — ABNORMAL HIGH (ref 6.0–10.0)
MPV: 12.7 fL — ABNORMAL HIGH (ref 6.0–10.0)
Monocytes %: 2.4 % (ref 1–13)
Monocytes %: 0.9 % — ABNORMAL LOW (ref 1–13)
Neutrophils %: 85.2 % — ABNORMAL HIGH (ref 34–64)
Neutrophils %: 91.9 % — ABNORMAL HIGH (ref 34–64)
Nucleated RBCs: 0 (ref 0–0)
Nucleated RBCs: 0 (ref 0–0)
Platelets: 267 10*3/uL (ref 140–450)
Platelets: 246 10*3/uL (ref 140–450)
RBC: 4.24 M/uL (ref 3.60–5.20)
RBC: 4.17 M/uL (ref 3.60–5.20)
RDW-SD: 42 (ref 36.4–46.3)
RDW-SD: 44.4 (ref 36.4–46.3)
WBC: 16.4 10*3/uL — ABNORMAL HIGH (ref 4.0–11.0)
WBC: 23.4 10*3/uL — ABNORMAL HIGH (ref 4.0–11.0)

## 2018-02-25 LAB — BASIC METABOLIC PANEL
Anion Gap: 8 mmol/L (ref 5–15)
Anion Gap: 11 mmol/L (ref 5–15)
Anion Gap: 11 mmol/L (ref 5–15)
Anion Gap: 11 mmol/L (ref 5–15)
BUN: 10 mg/dl (ref 7–25)
BUN: 11 mg/dl (ref 7–25)
BUN: 14 mg/dl (ref 7–25)
BUN: 14 mg/dl (ref 7–25)
CO2: 20 mEq/L — ABNORMAL LOW (ref 21–32)
CO2: 16 mEq/L — ABNORMAL LOW (ref 21–32)
CO2: 16 mEq/L — ABNORMAL LOW (ref 21–32)
CO2: 19 mEq/L — ABNORMAL LOW (ref 21–32)
Calcium: 8.5 mg/dl (ref 8.5–10.1)
Calcium: 8.2 mg/dl — ABNORMAL LOW (ref 8.5–10.1)
Calcium: 8.1 mg/dl — ABNORMAL LOW (ref 8.5–10.1)
Calcium: 8.4 mg/dl — ABNORMAL LOW (ref 8.5–10.1)
Chloride: 106 mEq/L (ref 98–107)
Chloride: 107 mEq/L (ref 98–107)
Chloride: 111 mEq/L — ABNORMAL HIGH (ref 98–107)
Chloride: 106 mEq/L (ref 98–107)
Creatinine: 0.9 mg/dl (ref 0.6–1.3)
Creatinine: 1.1 mg/dl (ref 0.6–1.3)
Creatinine: 1.1 mg/dl (ref 0.6–1.3)
Creatinine: 1.1 mg/dl (ref 0.6–1.3)
EGFR IF NonAfrican American: >60
EGFR IF NonAfrican American: >60
EGFR IF NonAfrican American: >60
EGFR IF NonAfrican American: >60
GFR African American: >60
GFR African American: >60
GFR African American: >60
GFR African American: >60
Glucose: 271 mg/dl — ABNORMAL HIGH (ref 74–106)
Glucose: 268 mg/dl — ABNORMAL HIGH (ref 74–106)
Glucose: 87 mg/dl (ref 74–106)
Glucose: 205 mg/dl — ABNORMAL HIGH (ref 74–106)
Potassium: 3.7 mEq/L (ref 3.5–5.1)
Potassium: 4.2 mEq/L (ref 3.5–5.1)
Potassium: 3.8 mEq/L (ref 3.5–5.1)
Potassium: 3.7 mEq/L (ref 3.5–5.1)
Sodium: 134 mEq/L — ABNORMAL LOW (ref 136–145)
Sodium: 134 mEq/L — ABNORMAL LOW (ref 136–145)
Sodium: 138 mEq/L (ref 136–145)
Sodium: 136 mEq/L (ref 136–145)

## 2018-02-25 LAB — COMPREHENSIVE METABOLIC PANEL
ALT: 26 U/L (ref 12–78)
AST: 42 U/L — ABNORMAL HIGH (ref 15–37)
Albumin: 3.8 gm/dl (ref 3.4–5.0)
Alkaline Phosphatase: 102 U/L (ref 45–117)
Anion Gap: 10 mmol/L (ref 5–15)
BUN: 11 mg/dl (ref 7–25)
CO2: 19 mEq/L — ABNORMAL LOW (ref 21–32)
Calcium: 9 mg/dl (ref 8.5–10.1)
Chloride: 104 mEq/L (ref 98–107)
Creatinine: 1.2 mg/dl (ref 0.6–1.3)
EGFR IF NonAfrican American: 55
GFR African American: >60
Glucose: 298 mg/dl — ABNORMAL HIGH (ref 74–106)
Potassium: 4.5 mEq/L (ref 3.5–5.1)
Sodium: 133 mEq/L — ABNORMAL LOW (ref 136–145)
Total Bilirubin: 0.9 mg/dl (ref 0.2–1.0)
Total Protein: 8.2 gm/dl (ref 6.4–8.2)

## 2018-02-25 LAB — DRUG SCREEN, URINE
Amphetamine: NEGATIVE
Amphetamines: NEGATIVE
Barbiturates: NEGATIVE
Barbiturates: NEGATIVE
Benzodiazapines: NEGATIVE
Benzodiazepines: NEGATIVE
Cocaine: NEGATIVE
Cocaine: NEGATIVE
Marijuana: POSITIVE — AB
Marijuana: POSITIVE — AB
Methadone: NEGATIVE
Methadone: NEGATIVE
Opiates: NEGATIVE
Opiates: NEGATIVE
Phencyclidine: NEGATIVE
Phencyclidine: NEGATIVE

## 2018-02-25 LAB — POCT GLUCOSE
POC Glucose: 272 mg/dL — ABNORMAL HIGH (ref 65–105)
POC Glucose: 195 mg/dL — ABNORMAL HIGH (ref 65–105)
POC Glucose: 250 mg/dL — ABNORMAL HIGH (ref 65–105)
POC Glucose: 315 mg/dL — ABNORMAL HIGH (ref 65–105)
POC Glucose: 121 mg/dL — ABNORMAL HIGH (ref 65–105)
POC Glucose: 237 mg/dL — ABNORMAL HIGH (ref 65–105)
POC Glucose: 217 mg/dL — ABNORMAL HIGH (ref 65–105)

## 2018-02-25 LAB — MAGNESIUM
Magnesium: 1.7 mg/dl (ref 1.6–2.6)
Magnesium: 1.7 mg/dl (ref 1.6–2.6)
Magnesium: 1.5 mg/dl — ABNORMAL LOW (ref 1.6–2.6)
Magnesium: 1.5 mg/dl — ABNORMAL LOW (ref 1.6–2.6)
Magnesium: 2 mg/dl (ref 1.6–2.6)
Magnesium: 2 mg/dl (ref 1.6–2.6)
Magnesium: 1.8 mg/dl (ref 1.6–2.6)
Magnesium: 1.8 mg/dl (ref 1.6–2.6)

## 2018-02-25 LAB — POC BLOOD GAS + LACTIC ACID
BASE EXCESS: -4 mmol/L — ABNORMAL LOW (ref <=3)
BICARBONATE: 19.8 mmol/L (ref 18.0–26.0)
Base Excess: -4 mmol/L — ABNORMAL LOW (ref <=3)
CO2 Total: 21 mmol/L — ABNORMAL LOW (ref 24–29)
CO2, TOTAL: 21 mmol/L — ABNORMAL LOW (ref 24–29)
HCO3: 19.8 mmol/L (ref 18.0–26.0)
O2 SAT: 99 % (ref 90–100)
O2 Sat: 99 % (ref 90–100)
PCO2: 29.4 mm Hg — ABNORMAL LOW (ref 35.0–45.0)
PCO2: 29.4 mm Hg — ABNORMAL LOW (ref 35.0–45.0)
PO2: 116 mm Hg — ABNORMAL HIGH (ref 75–100)
PO2: 116 mm Hg — ABNORMAL HIGH (ref 75–100)
Patient temp.: 98.4
Patient temperature: 98.4
pH: 7.436 (ref 7.350–7.450)
pH: 7.436 (ref 7.350–7.450)

## 2018-02-25 LAB — HEMOGLOBIN A1C W/O EAG
Hemoglobin A1C: 9.2 % — ABNORMAL HIGH (ref 4.2–5.6)
Hemoglobin A1c: 9.2 % — ABNORMAL HIGH (ref 4.2–5.6)

## 2018-02-25 LAB — LIPASE
Lipase: 68 U/L — ABNORMAL LOW (ref 73–393)
Lipase: 68 U/L — ABNORMAL LOW (ref 73–393)

## 2018-02-25 LAB — METABOLIC PANEL, BASIC
Anion gap: 8 mmol/L (ref 5–15)
Anion gap: 11 mmol/L (ref 5–15)
Anion gap: 11 mmol/L (ref 5–15)
Anion gap: 11 mmol/L (ref 5–15)
BUN: 10 mg/dl (ref 7–25)
BUN: 11 mg/dl (ref 7–25)
BUN: 14 mg/dl (ref 7–25)
BUN: 14 mg/dl (ref 7–25)
CO2: 20 mEq/L — ABNORMAL LOW (ref 21–32)
CO2: 16 mEq/L — ABNORMAL LOW (ref 21–32)
CO2: 16 mEq/L — ABNORMAL LOW (ref 21–32)
CO2: 19 mEq/L — ABNORMAL LOW (ref 21–32)
Calcium: 8.5 mg/dl (ref 8.5–10.1)
Calcium: 8.2 mg/dl — ABNORMAL LOW (ref 8.5–10.1)
Calcium: 8.1 mg/dl — ABNORMAL LOW (ref 8.5–10.1)
Calcium: 8.4 mg/dl — ABNORMAL LOW (ref 8.5–10.1)
Chloride: 106 mEq/L (ref 98–107)
Chloride: 107 mEq/L (ref 98–107)
Chloride: 111 mEq/L — ABNORMAL HIGH (ref 98–107)
Chloride: 106 mEq/L (ref 98–107)
Creatinine: 0.9 mg/dl (ref 0.6–1.3)
Creatinine: 1.1 mg/dl (ref 0.6–1.3)
Creatinine: 1.1 mg/dl (ref 0.6–1.3)
Creatinine: 1.1 mg/dl (ref 0.6–1.3)
GFR est AA: >60
GFR est AA: >60
GFR est AA: >60
GFR est AA: >60
GFR est non-AA: >60
GFR est non-AA: >60
GFR est non-AA: >60
GFR est non-AA: >60
Glucose: 271 mg/dl — ABNORMAL HIGH (ref 74–106)
Glucose: 268 mg/dl — ABNORMAL HIGH (ref 74–106)
Glucose: 87 mg/dl (ref 74–106)
Glucose: 205 mg/dl — ABNORMAL HIGH (ref 74–106)
Potassium: 3.7 mEq/L (ref 3.5–5.1)
Potassium: 4.2 mEq/L (ref 3.5–5.1)
Potassium: 3.8 mEq/L (ref 3.5–5.1)
Potassium: 3.7 mEq/L (ref 3.5–5.1)
Sodium: 134 mEq/L — ABNORMAL LOW (ref 136–145)
Sodium: 134 mEq/L — ABNORMAL LOW (ref 136–145)
Sodium: 138 mEq/L (ref 136–145)
Sodium: 136 mEq/L (ref 136–145)

## 2018-02-25 LAB — CBC WITH AUTOMATED DIFF
BASOPHILS: 0.4 % (ref 0–3)
BASOPHILS: 0.1 % (ref 0–3)
EOSINOPHILS: 0 % (ref 0–5)
EOSINOPHILS: 0 % (ref 0–5)
HCT: 34.8 % — ABNORMAL LOW (ref 37.0–50.0)
HCT: 34.9 % — ABNORMAL LOW (ref 37.0–50.0)
HGB: 11.3 gm/dl — ABNORMAL LOW (ref 13.0–17.2)
HGB: 11.2 gm/dl — ABNORMAL LOW (ref 13.0–17.2)
IMMATURE GRANULOCYTES: 0.4 % (ref 0.0–3.0)
IMMATURE GRANULOCYTES: 0.6 % (ref 0.0–3.0)
LYMPHOCYTES: 11.6 % — ABNORMAL LOW (ref 28–48)
LYMPHOCYTES: 6.5 % — ABNORMAL LOW (ref 28–48)
MCH: 26.7 pg (ref 25.4–34.6)
MCH: 26.9 pg (ref 25.4–34.6)
MCHC: 32.5 gm/dl (ref 30.0–36.0)
MCHC: 32.1 gm/dl (ref 30.0–36.0)
MCV: 82.1 fL (ref 80.0–98.0)
MCV: 83.7 fL (ref 80.0–98.0)
MONOCYTES: 2.4 % (ref 1–13)
MONOCYTES: 0.9 % — ABNORMAL LOW (ref 1–13)
MPV: 13.5 fL — ABNORMAL HIGH (ref 6.0–10.0)
MPV: 12.7 fL — ABNORMAL HIGH (ref 6.0–10.0)
NEUTROPHILS: 85.2 % — ABNORMAL HIGH (ref 34–64)
NEUTROPHILS: 91.9 % — ABNORMAL HIGH (ref 34–64)
NRBC: 0 (ref 0–0)
NRBC: 0 (ref 0–0)
PLATELET: 267 10*3/uL (ref 140–450)
PLATELET: 246 10*3/uL (ref 140–450)
RBC: 4.24 M/uL (ref 3.60–5.20)
RBC: 4.17 M/uL (ref 3.60–5.20)
RDW-SD: 42 (ref 36.4–46.3)
RDW-SD: 44.4 (ref 36.4–46.3)
WBC: 16.4 10*3/uL — ABNORMAL HIGH (ref 4.0–11.0)
WBC: 23.4 10*3/uL — ABNORMAL HIGH (ref 4.0–11.0)

## 2018-02-25 LAB — METABOLIC PANEL, COMPREHENSIVE
ALT (SGPT): 26 U/L (ref 12–78)
AST (SGOT): 42 U/L — ABNORMAL HIGH (ref 15–37)
Albumin: 3.8 gm/dl (ref 3.4–5.0)
Alk. phosphatase: 102 U/L (ref 45–117)
Anion gap: 10 mmol/L (ref 5–15)
BUN: 11 mg/dl (ref 7–25)
Bilirubin, total: 0.9 mg/dl (ref 0.2–1.0)
CO2: 19 mEq/L — ABNORMAL LOW (ref 21–32)
Calcium: 9 mg/dl (ref 8.5–10.1)
Chloride: 104 mEq/L (ref 98–107)
Creatinine: 1.2 mg/dl (ref 0.6–1.3)
GFR est AA: >60
GFR est non-AA: 55
Glucose: 298 mg/dl — ABNORMAL HIGH (ref 74–106)
Potassium: 4.5 mEq/L (ref 3.5–5.1)
Protein, total: 8.2 gm/dl (ref 6.4–8.2)
Sodium: 133 mEq/L — ABNORMAL LOW (ref 136–145)

## 2018-02-25 LAB — GLUCOSE, POC
Glucose (POC): 272 mg/dL — ABNORMAL HIGH (ref 65–105)
Glucose (POC): 195 mg/dL — ABNORMAL HIGH (ref 65–105)
Glucose (POC): 250 mg/dL — ABNORMAL HIGH (ref 65–105)
Glucose (POC): 315 mg/dL — ABNORMAL HIGH (ref 65–105)
Glucose (POC): 121 mg/dL — ABNORMAL HIGH (ref 65–105)
Glucose (POC): 237 mg/dL — ABNORMAL HIGH (ref 65–105)
Glucose (POC): 217 mg/dL — ABNORMAL HIGH (ref 65–105)

## 2018-02-25 MED ORDER — PROMETHAZINE IN NS 25 MG/50 ML IV PIGGY BAG
25 mg/50 ml | INTRAVENOUS | Status: AC
Start: 2018-02-25 — End: 2018-02-24
  Administered 2018-02-25: 01:00:00 via INTRAVENOUS

## 2018-02-25 MED ORDER — METOCLOPRAMIDE 5 MG/ML IJ SOLN
5 mg/mL | INTRAMUSCULAR | Status: AC
Start: 2018-02-25 — End: 2018-02-25
  Administered 2018-02-25: 06:00:00 via INTRAVENOUS

## 2018-02-25 MED ORDER — DEXTROSE 50% IN WATER (D50W) IV
INTRAVENOUS | Status: DC | PRN
Start: 2018-02-25 — End: 2018-02-25

## 2018-02-25 MED ORDER — ONDANSETRON (PF) 4 MG/2 ML INJECTION
4 mg/2 mL | Freq: Once | INTRAMUSCULAR | Status: AC
Start: 2018-02-25 — End: 2018-02-24
  Administered 2018-02-25: 01:00:00 via INTRAVENOUS

## 2018-02-25 MED ORDER — LABETALOL 5 MG/ML IV SOLN
5 mg/mL | INTRAVENOUS | Status: AC
Start: 2018-02-25 — End: 2018-02-25
  Administered 2018-02-25: 06:00:00 via INTRAVENOUS

## 2018-02-25 MED ORDER — ENOXAPARIN 40 MG/0.4 ML SUB-Q SYRINGE
40 mg/0.4 mL | SUBCUTANEOUS | Status: DC
Start: 2018-02-25 — End: 2018-02-27

## 2018-02-25 MED ORDER — SODIUM CHLORIDE 0.9% BOLUS IV
0.9 % | Freq: Once | INTRAVENOUS | Status: AC
Start: 2018-02-25 — End: 2018-02-25
  Administered 2018-02-25: 06:00:00 via INTRAVENOUS

## 2018-02-25 MED ORDER — ONDANSETRON (PF) 4 MG/2 ML INJECTION
4 mg/2 mL | INTRAMUSCULAR | Status: DC | PRN
Start: 2018-02-25 — End: 2018-02-27

## 2018-02-25 MED ORDER — INSULIN GLARGINE 100 UNIT/ML INJECTION
100 unit/mL | Freq: Every evening | SUBCUTANEOUS | Status: DC
Start: 2018-02-25 — End: 2018-02-27
  Administered 2018-02-26 – 2018-02-27 (×2): via SUBCUTANEOUS

## 2018-02-25 MED ORDER — SODIUM CHLORIDE 0.9 % IJ SYRG
INTRAMUSCULAR | Status: DC | PRN
Start: 2018-02-25 — End: 2018-02-27

## 2018-02-25 MED ORDER — HALOPERIDOL LACTATE 5 MG/ML IJ SOLN
5 mg/mL | Freq: Once | INTRAMUSCULAR | Status: AC
Start: 2018-02-25 — End: 2018-02-24
  Administered 2018-02-25: 01:00:00 via INTRAVENOUS

## 2018-02-25 MED ORDER — NALOXONE 0.4 MG/ML INJECTION
0.4 mg/mL | INTRAMUSCULAR | Status: DC | PRN
Start: 2018-02-25 — End: 2018-02-27

## 2018-02-25 MED ORDER — LISINOPRIL 20 MG TAB
20 mg | Freq: Every day | ORAL | Status: DC
Start: 2018-02-25 — End: 2018-02-27
  Administered 2018-02-25 – 2018-02-27 (×2): via ORAL

## 2018-02-25 MED ORDER — DIPHENHYDRAMINE HCL 50 MG/ML IJ SOLN
50 mg/mL | Freq: Once | INTRAMUSCULAR | Status: AC
Start: 2018-02-25 — End: 2018-02-24
  Administered 2018-02-25: 01:00:00 via INTRAVENOUS

## 2018-02-25 MED ORDER — SODIUM CHLORIDE 0.9 % IV
INTRAVENOUS | Status: DC
Start: 2018-02-25 — End: 2018-02-25
  Administered 2018-02-25: 11:00:00 via INTRAVENOUS

## 2018-02-25 MED ORDER — INSULIN LISPRO 100 UNIT/ML INJECTION
100 unit/mL | Freq: Four times a day (QID) | SUBCUTANEOUS | Status: DC
Start: 2018-02-25 — End: 2018-02-27
  Administered 2018-02-26 – 2018-02-27 (×4): via SUBCUTANEOUS

## 2018-02-25 MED ORDER — DEXTROSE 50% IN WATER (D50W) IV
INTRAVENOUS | Status: DC | PRN
Start: 2018-02-25 — End: 2018-02-27

## 2018-02-25 MED ORDER — SODIUM CHLORIDE 0.9% BOLUS IV
0.9 % | INTRAVENOUS | Status: AC
Start: 2018-02-25 — End: 2018-02-24
  Administered 2018-02-25: 01:00:00 via INTRAVENOUS

## 2018-02-25 MED ORDER — SODIUM CHLORIDE 0.9 % IJ SYRG
Freq: Three times a day (TID) | INTRAMUSCULAR | Status: DC
Start: 2018-02-25 — End: 2018-02-27
  Administered 2018-02-25 – 2018-02-27 (×8): via INTRAVENOUS

## 2018-02-25 MED ORDER — SODIUM CHLORIDE 0.9 % INJECTION
40 mg | Freq: Two times a day (BID) | INTRAMUSCULAR | Status: DC
Start: 2018-02-25 — End: 2018-02-26
  Administered 2018-02-25 – 2018-02-26 (×3): via INTRAVENOUS

## 2018-02-25 MED ORDER — GLUCAGON 1 MG INJECTION
1 mg | INTRAMUSCULAR | Status: DC | PRN
Start: 2018-02-25 — End: 2018-02-27

## 2018-02-25 MED ORDER — INSULIN LISPRO 100 UNIT/ML INJECTION
100 unit/mL | SUBCUTANEOUS | Status: DC | PRN
Start: 2018-02-25 — End: 2018-02-27
  Administered 2018-02-25 – 2018-02-27 (×5): via SUBCUTANEOUS

## 2018-02-25 MED ORDER — INSULIN REGULAR 100 UNIT/100 ML (1 UNIT/ML) IN 0.9 % NS INFUSION
100 unit/ mL (1 unit/mL) | INTRAVENOUS | Status: DC
Start: 2018-02-25 — End: 2018-02-25
  Administered 2018-02-25: 03:00:00 via INTRAVENOUS

## 2018-02-25 MED ORDER — ACETAMINOPHEN 1,000 MG/100 ML (10 MG/ML) IV
1000 mg/100 mL (10 mg/mL) | Freq: Once | INTRAVENOUS | Status: AC
Start: 2018-02-25 — End: 2018-02-25
  Administered 2018-02-25: 11:00:00 via INTRAVENOUS

## 2018-02-25 MED ORDER — SODIUM CHLORIDE 0.9 % IV
INTRAVENOUS | Status: DC
Start: 2018-02-25 — End: 2018-02-26
  Administered 2018-02-25 – 2018-02-26 (×4): via INTRAVENOUS

## 2018-02-25 MED ORDER — PROMETHAZINE IN NS 25 MG/50 ML IV PIGGY BAG
25 mg/50 ml | Freq: Four times a day (QID) | INTRAVENOUS | Status: AC | PRN
Start: 2018-02-25 — End: 2018-02-26
  Administered 2018-02-25 – 2018-02-26 (×2): via INTRAVENOUS

## 2018-02-25 MED ORDER — GLUCAGON 1 MG INJECTION
1 mg | INTRAMUSCULAR | Status: DC | PRN
Start: 2018-02-25 — End: 2018-02-25

## 2018-02-25 MED ORDER — PHARMACY INFORMATION NOTE
Status: AC
Start: 2018-02-25 — End: 2018-02-24

## 2018-02-25 MED FILL — BD POSIFLUSH NORMAL SALINE 0.9 % INJECTION SYRINGE: INTRAMUSCULAR | Qty: 10

## 2018-02-25 MED FILL — LABETALOL 5 MG/ML IV SOLN: 5 mg/mL | INTRAVENOUS | Qty: 20

## 2018-02-25 MED FILL — DIPHENHYDRAMINE HCL 50 MG/ML IJ SOLN: 50 mg/mL | INTRAMUSCULAR | Qty: 1

## 2018-02-25 MED FILL — OFIRMEV 1,000 MG/100 ML (10 MG/ML) INTRAVENOUS SOLUTION: 1000 mg/100 mL (10 mg/mL) | INTRAVENOUS | Qty: 100

## 2018-02-25 MED FILL — ONDANSETRON (PF) 4 MG/2 ML INJECTION: 4 mg/2 mL | INTRAMUSCULAR | Qty: 2

## 2018-02-25 MED FILL — MYXREDLIN (REGULAR INSULIN) 100 UNIT/100 ML (1 UNIT/ML) IV SOLUTION: 100 unit/ mL (1 unit/mL) | INTRAVENOUS | Qty: 100

## 2018-02-25 MED FILL — SODIUM CHLORIDE 0.9 % IV: INTRAVENOUS | Qty: 1000

## 2018-02-25 MED FILL — PHARMACY INFORMATION NOTE: Qty: 1

## 2018-02-25 MED FILL — HALOPERIDOL LACTATE 5 MG/ML IJ SOLN: 5 mg/mL | INTRAMUSCULAR | Qty: 1

## 2018-02-25 MED FILL — PANTOPRAZOLE 40 MG IV SOLR: 40 mg | INTRAVENOUS | Qty: 40

## 2018-02-25 MED FILL — METOCLOPRAMIDE 5 MG/ML IJ SOLN: 5 mg/mL | INTRAMUSCULAR | Qty: 2

## 2018-02-25 MED FILL — ENOXAPARIN 40 MG/0.4 ML SUB-Q SYRINGE: 40 mg/0.4 mL | SUBCUTANEOUS | Qty: 0.4

## 2018-02-25 MED FILL — PROMETHAZINE IN NS 25 MG/50 ML IV PIGGY BAG: 25 mg/50 ml | INTRAVENOUS | Qty: 50

## 2018-02-25 MED FILL — LISINOPRIL 20 MG TAB: 20 mg | ORAL | Qty: 1

## 2018-02-25 NOTE — H&P (Signed)
Chart reviewed.  Patient seen and examined on 02/25/2018.  Complete H&P to follow.

## 2018-02-25 NOTE — H&P (Signed)
H&P by Knox Saliva, MD at 02/25/18 [phone]                Author: Knox Saliva, MD  Service: Hospitalist  Author Type: Physician       Filed: 02/25/18 1001  Date of Service: 02/25/18 0043  Status: Signed          Editor: Knox Saliva, MD (Physician)                       History and Physical      DOS: February 25, 2018      Patient: [name] [name]               Sex: female          DOA: 02/24/2018         Date of Birth:  August 25, 1985      Age:  33 y.o.        LOS:  LOS: 0 days         MRN: [id number]            Impression/Plan                 33 y.o. female with hx of HTN, IDDM, Gastroparesis, and Cyclic Vomiting syndrome with the following:           1.  Intractable nausea and vomiting    2.  Cyclic vomiting syndrome    3.  IDDM    4.  Diabetic gastroparesis    5.  HTN         PLAN:       Admit to OBS, tele   O2 as needed.  GI and DVT prophylaxis.   Replace electrolytes as needed.     Symptomatic treatment for nausea and vomiting.   IV Protonix 40 mg twice daily.   Monitor blood glucose.  Glucomander per protocol.   Consider GI consult in a.m.   Hydrate with IV fluids and monitor to avoid fluid overload.   Continue home medications when possible.   Further recommendations based on test results, response to treatment, and clinical course.        Chief Complaint:          Chief Complaint       Patient presents with        ?  Abdominal Pain     ?  Vomiting        ?  Nausea             HPI:           [name] [name] is a 33 y.o.  female with hx of HTN, IDDM, Gastroparesis, and Cyclic Vomiting syndrome with c/o persistent intractable nausea and vomiting since around 12 noon.  Patient denies any fever or chills.  She states that  she was seen in the ED for hypoglycemia and subsequently discharged home.  Her symptoms began after she returned home.  She has had abdominal pain which she describes as stabbing in nature.  Zofran per EMS did not provide relief.  Patient was initially  placed on IV insulin drip per  protocol with plans to  transition to SQ insulin with improvement of her overall condition.              Past Medical History:        Diagnosis  Date         ?  Cyclical  vomiting  07/09/2017     ?  Diabetes ([place])       ?  Diabetic ketoacidosis without coma associated with type 1 diabetes mellitus ([place])  11/05/2015     ?  Drug-seeking behavior       ?  Essential hypertension  03/27/2016          Amlodipine, carvedilol, lisinopril.         ?  Gastroparesis       ?  Gastroparesis       ?  Hypertension       ?  Insulin long-term use ([place])  11/20/2016     ?  Slow transit constipation  08/08/2017     ?  Type 1 diabetes mellitus ([place])  02/17/2009          Diagnosed at age 28. Had insulin pump at one time, but developed keloids at needle insertion sites. Was supposed to establish with [facility].             Past Surgical History:         Procedure  Laterality  Date          ?  ABDOMEN SURGERY PROC UNLISTED    05/2015          Gastric stimulator pump           ?  HX CESAREAN SECTION                 Family History         Problem  Relation  Age of Onset          ?  Diabetes  Father       ?  Stroke  Other                great grandmother             Social History          Socioeconomic History         ?  Marital status:  SINGLE              Spouse name:  Not on file         ?  Number of children:  Not on file     ?  Years of education:  Not on file     ?  Highest education level:  Not on file       Tobacco Use         ?  Smoking status:  Never Smoker     ?  Smokeless tobacco:  Never Used       Substance and Sexual Activity         ?  Alcohol use:  No     ?  Drug use:  Yes              Types:  Marijuana             Comment: last use 02/02/2018         ?  Sexual activity:  Yes              Birth control/protection:  Injection             Prior to Admission medications             Medication  Sig  Start Date  End Date  Taking?  Authorizing Provider            LISINOPRIL PO  Take  by mouth daily.      Yes  Other, Phys, MD      insulin aspart U-100 (NOVOLOG FLEXPEN U-100 INSULIN) 100 unit/mL (3 mL) inpn  Before breakfast, lunch, dinner and at bedtime. Inject subcutaneously per sliding scale 3 times a day.      Yes  Other, Phys, MD            insulin glargine (LANTUS,BASAGLAR) 100 unit/mL (3 mL) inpn  28 Units by SubCUTAneous route every evening.      Yes  Other, Phys, MD             Allergies        Allergen  Reactions         ?  Morphine  Itching             Pt denies allergy         ?  Robaxin [Methocarbamol]  Other (comments)             Body shaking, nausea         ?  Penicillins  Hives         ?  Zithromax [Azithromycin]  Hives           Review of Systems:      1) See above. 2) A 12 point review of systems has been obtained. ROS is otherwise noncontributory.           Physical Exam:         Visit Vitals      BP  (!) 211/97 (BP 1 Location: Right arm, BP Patient Position: Head of bed elevated (Comment degrees))     Pulse  91     Temp  98.4 ??F (36.9 ??C)     Resp  13     Ht  '5\' 6"'  (1.676 m)     Wt  51.7 kg (114 lb)     SpO2  99%        BMI  18.40 kg/m??              Intake/Output Summary (Last 24 hours) at 02/25/2018 0043   Last data filed at 02/24/2018 2035     Gross per 24 hour        Intake  1000 ml        Output  --        Net  1000 ml           Physical Exam:      HEENT: NC/AT, PERRLA, EOMI, dry mucous membranes   Neck: No JVD no bruits, supple, nontender, no significant LAD   LYMPH: No supraclavicular or cervical or axillary nodes on both sides   Cardiovascular: Heart, RRR, no M, R, G   Lungs: Decreased breath sounds at bases, coarse upper airway sounds, no wheezes or crackles   Abd: Soft, diffuse discomfort to palpation,  not distended, No guarding, No rigidity, BS normal   NEURO:  Non focal, normal strength. CN II - XII intact bilaterally    Extrm: no leg edema    Skin: No rashes or lesions            Labs Reviewed:          Recent Results (from the past 24 hour(s))     GLUCOSE, POC          Collection Time: 02/24/18  8:47 AM          Result  Value  Ref Range  Glucose (POC)  81  65 - 105 mg/dL       POC URINE MACROSCOPIC          Collection Time: 02/24/18  9:27 AM         Result  Value  Ref Range            Glucose  500 (A)  NEGATIVE,Negative mg/dl       Bilirubin  Negative  NEGATIVE,Negative         Ketone  Negative  NEGATIVE,Negative mg/dl       Specific gravity  1.015  1.005 - 1.030         Blood  Negative  NEGATIVE,Negative         pH (UA)  6.5  5 - 9         Protein  30 (A)  NEGATIVE,Negative mg/dl       Urobilinogen  0.2  0.0 - 1.0 EU/dl       Nitrites  Negative  NEGATIVE,Negative         Leukocyte Esterase  Negative  NEGATIVE,Negative         Color  Light yellow          Appearance  Slightly Cloudy          POC HCG,URINE          Collection Time: 02/24/18  9:32 AM         Result  Value  Ref Range            HCG urine, QL  negative  NEGATIVE,Negative,negative         CBC WITH AUTOMATED DIFF          Collection Time: 02/24/18  9:32 AM         Result  Value  Ref Range            WBC  12.2 (H)  4.0 - 11.0 1000/mm3       RBC  4.50  3.60 - 5.20 M/uL       HGB  12.1 (L)  13.0 - 17.2 gm/dl       HCT  37.8  37.0 - 50.0 %       MCV  84.0  80.0 - 98.0 fL       MCH  26.9  25.4 - 34.6 pg       MCHC  32.0  30.0 - 36.0 gm/dl       PLATELET  277  140 - 450 1000/mm3       MPV  12.2 (H)  6.0 - 10.0 fL       RDW-SD  43.6  36.4 - 46.3         NRBC  0  0 - 0         IMMATURE GRANULOCYTES  0.3  0.0 - 3.0 %       NEUTROPHILS  83.3 (H)  34 - 64 %       LYMPHOCYTES  13.2 (L)  28 - 48 %       MONOCYTES  2.8  1 - 13 %       EOSINOPHILS  0.0  0 - 5 %       BASOPHILS  0.4  0 - 3 %       METABOLIC PANEL, BASIC          Collection Time: 02/24/18  9:32 AM         Result  Value  Ref Range  Sodium  135 (L)  136 - 145 mEq/L       Potassium  3.6  3.5 - 5.1 mEq/L       Chloride  104  98 - 107 mEq/L       CO2  26  21 - 32 mEq/L       Glucose  159 (H)  74 - 106 mg/dl       BUN  11  7 - 25 mg/dl       Creatinine  1.1  0.6 - 1.3 mg/dl       GFR est AA  >60.0           GFR est non-AA  >60          Calcium  8.6  8.5 - 10.1 mg/dl       Anion gap  5  5 - 15 mmol/L       CBC WITH AUTOMATED DIFF          Collection Time: 02/24/18  7:15 PM         Result  Value  Ref Range            WBC  16.4 (H)  4.0 - 11.0 1000/mm3            RBC  4.24  3.60 - 5.20 M/uL       HGB  11.3 (L)  13.0 - 17.2 gm/dl       HCT  34.8 (L)  37.0 - 50.0 %       MCV  82.1  80.0 - 98.0 fL       MCH  26.7  25.4 - 34.6 pg       MCHC  32.5  30.0 - 36.0 gm/dl       PLATELET  267  140 - 450 1000/mm3       MPV  13.5 (H)  6.0 - 10.0 fL       RDW-SD  42.0  36.4 - 46.3         NRBC  0  0 - 0         IMMATURE GRANULOCYTES  0.4  0.0 - 3.0 %       NEUTROPHILS  85.2 (H)  34 - 64 %       LYMPHOCYTES  11.6 (L)  28 - 48 %       MONOCYTES  2.4  1 - 13 %       EOSINOPHILS  0.0  0 - 5 %       BASOPHILS  0.4  0 - 3 %       METABOLIC PANEL, COMPREHENSIVE          Collection Time: 02/24/18  7:15 PM         Result  Value  Ref Range            Sodium  133 (L)  136 - 145 mEq/L       Potassium  4.5  3.5 - 5.1 mEq/L       Chloride  104  98 - 107 mEq/L       CO2  19 (L)  21 - 32 mEq/L       Glucose  298 (H)  74 - 106 mg/dl       BUN  11  7 - 25 mg/dl       Creatinine  1.2  0.6 - 1.3 mg/dl       GFR est  AA  >60.0          GFR est non-AA  55          Calcium  9.0  8.5 - 10.1 mg/dl       AST (SGOT)  42 (H)  15 - 37 U/L       ALT (SGPT)  26  12 - 78 U/L       Alk. phosphatase  102  45 - 117 U/L       Bilirubin, total  0.9  0.2 - 1.0 mg/dl       Protein, total  8.2  6.4 - 8.2 gm/dl       Albumin  3.8  3.4 - 5.0 gm/dl       Anion gap  10  5 - 15 mmol/L       LIPASE          Collection Time: 02/24/18  7:15 PM         Result  Value  Ref Range            Lipase  68 (L)  73 - 393 U/L       METABOLIC PANEL, BASIC          Collection Time: 02/24/18  9:34 PM         Result  Value  Ref Range            Sodium  134 (L)  136 - 145 mEq/L       Potassium  3.7  3.5 - 5.1 mEq/L       Chloride  106  98 - 107 mEq/L       CO2  20 (L)  21 - 32 mEq/L        Glucose  271 (H)  74 - 106 mg/dl       BUN  10  7 - 25 mg/dl       Creatinine  0.9  0.6 - 1.3 mg/dl       GFR est AA  >60.0          GFR est non-AA  >60          Calcium  8.5  8.5 - 10.1 mg/dl       Anion gap  8  5 - 15 mmol/L       MAGNESIUM          Collection Time: 02/24/18  9:34 PM         Result  Value  Ref Range            Magnesium  1.7  1.6 - 2.6 mg/dl       GLUCOSE, POC          Collection Time: 02/24/18  9:38 PM         Result  Value  Ref Range            Glucose (POC)  272 (H)  65 - 105 mg/dL       POC BLOOD GAS + LACTIC ACID          Collection Time: 02/24/18  9:48 PM         Result  Value  Ref Range            pH  7.436  7.350 - 7.450         PCO2  29.4 (L)  35.0 - 45.0 mm Hg       PO2  116.0 (H)  75 -  100 mm Hg       BICARBONATE  19.8  18.0 - 26.0 mmol/L       O2 SAT  99.0  90 - 100 %       CO2, TOTAL  21.0 (L)  24 - 29 mmol/L       BASE EXCESS  -4 (L)  -2 - 3 mmol/L       Patient temp.  98.4 F          Sample type  Art          SITE  R Radial          DEVICE  Room Air          ALLENS TEST  Pass          GLUCOSE, POC          Collection Time: 02/24/18 10:57 PM         Result  Value  Ref Range            Glucose (POC)  195 (H)  65 - 105 mg/dL                   Diagnostic:           Dragon medical dictation software was used for portions of this report.  Efforts have been made to edit the dictations, but occasionally words are mis-transcribed.      Knox Saliva, MD   02/25/2018   12:43 AM

## 2018-02-25 NOTE — Progress Notes (Signed)
NUTRITION GLYCEMIC CONTROL EVALUATION    Education history:   []  No prior education  []  Inpatient education  [x]  Outpatient education  []  Other:   []  Unknown     Current diet order: DIET DIABETIC CLEAR LIQUID    Diet history: 2-3 meals/day, per pt she counts carbs- pt unwilling to provide much information at this time    Compliance history:    []  Compliant     []  Inconsistent/limited compliance     []  Noncompliant  [x]  Unknown     Biochemical data:   BS at hospital: 131-315 on glucommander   HgbA1c:   Lab Results   Component Value Date/Time    Hemoglobin A1c 9.2 (H) 02/25/2018 05:31 AM    Hemoglobin A1c 9.1 (H) 08/08/2017 09:28 AM    Hemoglobin A1c 10.1 (H) 10/12/2014 01:40 PM       Average BS at home: 120-180    Current meds affecting BS:   []  Insulin (drip)  [x]  Insulin (subq)  []  Insulin (pump)  []  Oral meds  []  Steroids  []  IV dextrose fluids  []  Other:    Home meds affecting BS:   []  Insulin (long acting)  [x]  Insulin (short/intermediate acting)  []  Insulin (pump)  []  Oral meds  []  Steroids  []  Other:    Education needed:  Pt refused  ______________________________________________________________________    Comments: Pt refused education.  Provided handout to patient and notified pt available as needed.       Adonis Huguenin, RD  02/25/18

## 2018-02-25 NOTE — Progress Notes (Signed)
PAGER ID: [phone]   MESSAGE: [phone]; [name] [name] Dx Intractable vomiting; HTN (attending [place]) Pt BP is elevated, could be due to pain. Pt is requesting something for pain. Allergies to morphine, robaxin, PCN and Zithromax. Char Ext [phone]    @ [phone] Received callback for 1x dose of tylenol 100mg  IV. From Dr. Langston Masker.    @ 1324 Current BP 171/89 HR 108

## 2018-02-25 NOTE — Progress Notes (Signed)
Problem: Falls - Risk of  Goal: *Absence of Falls  Description: Document Schmid Fall Risk and appropriate interventions in the flowsheet.  Outcome: Progressing Towards Goal  Note: Fall Risk Interventions:            Medication Interventions: Patient to call before getting OOB, Teach patient to arise slowly                   Problem: Patient Education: Go to Patient Education Activity  Goal: Patient/Family Education  Outcome: Progressing Towards Goal

## 2018-02-25 NOTE — ED Notes (Signed)
Pt's best friend [place] would like to be contacted when pt moved to IP bed. Brittany's phone number is [phone].   Per [place] time is not a factor as she may be reached at any time.

## 2018-02-25 NOTE — Group Note (Signed)
 Ordered IP-consult for diabetes educator complete.  Patient with multiple hospital ED visits and admissions for DM1 and gastroparesis complications.  Reviewed prior discharge summary from [facility] admitted for DKA earlier this month from 1/6-1/8 2020.  Pt states she sees Silvio Ranger MD, Endocrinologist for diabetes care. Pt reports she has an appt at the beginning of March.    Attempted to review pt's home regimen.  Pt answering some questions then zoning out for a few seconds and I had to repeatedly ask the question before I got an answer.  She apologized and stated she was very tired. Pt reports taking 28 units Basaglar (prescribed 26 at last discharge) and she uses a sliding scale for mealtimes typically taking 4 or 5 units before meals. She states her scale starts at a BG of 120.  She believes the 28 units of Basaglar is causing her frequent low BG. When asked if she lowered her Basaglar dose at all because of the low BG episodes she said no. She reports she has no Glucagon in her house for family to use.    Pt on Glucommander (GM) while admitted for BG control. Orders placed in GM this morning using sensitive/high target settings equalling a TDD of 17 units with 8 units recommended for tonight's Lantus dose. She has no long acting insulin  circulating at this time. Pt may be better off being discharged with her last Lantus dose used here at [facility] then she can titrate up until in her desired target range based on fasting BG readings at home.  Pt wanting to eat her bkfst.  Pt pleasant but no very forthcoming with information. She has no questions for me at this time.

## 2018-02-25 NOTE — Progress Notes (Signed)
Hospitalist Progress Note  Kristen Loader, MD  [facility]    Daily Progress Note: 02/25/2018    Assessment/Plan:     Intractable nausea and vomiting due to cyclic vomiting syndrome and diabetic gastroparesis  Likely due to her diabetic gastroparesis.    Patient does have a positive test on urine drug screen for marijuana  Patient know that she has not had any marijuana in about 3 weeks  Patient has a gastric stimulator in place  We will continue Zofran PRN  Continue IVF    DM type I  Patient initially planned for on insulin drip but then transitioned to Glucomander SQ  Glucose levels controlled  Advance diet    DM gastroparesis   S/p gastric stimulator  Patient has Zofran  May consider Reglan on top of the stimulator  Patient says she can feel the stimulator shocking her sometimes    HTN  Resume lisinopril 20 mg daily    DVT prophylaxis  SCDs    CODE STATUS.  Full code.    Disposition.  Patient appears to be improving we will see how she does overnight and with the addition of regular feeds.  Hopefully home soon.    ------------------    36 minutes were spent on the patient of which more than 50% was spent in coordination of care and counseling (time spent with patient/family face to face, physical exam, reviewing laboratory and imaging investigations, speaking with physicians and nursing staff involved in this patient's care)    ------------------  Physical exam:  General:  Alert, NAD, pleasant.  Cooperative. Thin. Many Tattoos  HEENT: Sclera anicteric, PERRL, OM moist, throat clear.  Chest:  CTA B, no wheeze, no rales, no rhonchi.  Good air movement.  CV: RRR, S1/S2 were normal, no murmur  Abdomen: NTND, soft, NABS, no masses were noted.  No rebound, no guarding.  Extremities: No edema.    Subjective:   Pt noted feeling better but with abd pain. We discussed DM gastroparesis and her gastric stimulator.  We discussed dosing a single dose of Dilaudid to help the abdominal pain.  We also  discussed advancing her diet      Objective:     Visit Vitals  BP (!) 175/97 (BP 1 Location: Right arm, BP Patient Position: Sitting)   Pulse (!) 104   Temp 98.8 ??F (37.1 ??C)   Resp 18   Ht '5\' 6"'  (1.676 m)   Wt 51.7 kg (114 lb)   LMP 01/30/2018 (Approximate)   SpO2 100%   Breastfeeding No   BMI 18.40 kg/m??      O2 Device: Room air    Temp (24hrs), Avg:98.7 ??F (37.1 ??C), Min:98.2 ??F (36.8 ??C), Max:99.1 ??F (37.3 ??C)    01/30 0701 - 01/30 1900  In: 840 [P.O.:840]  Out: 910 [Urine:910]   01/28 1901 - 01/30 0700  In: 3454.2 [I.V.:3454.2]  Out: -       Data Review:       Recent Days:  Recent Labs     02/25/18  0531 02/24/18  1915 02/24/18  0932   WBC 23.4* 16.4* 12.2*   HGB 11.2* 11.3* 12.1*   HCT 34.9* 34.8* 37.8   PLT 246 267 277     Recent Labs     02/25/18  1548 02/25/18  0902 02/25/18  0531  02/24/18  1915   NA 136 138 134*   < > 133*   K 3.7 3.8 4.2   < > 4.5  CL 106 111* 107   < > 104   CO2 19* 16* 16*   < > 19*   GLU 205* 87 268*   < > 298*   BUN '14 14 11   ' < > 11   CREA 1.1 1.1 1.1   < > 1.2   CA 8.4* 8.1* 8.2*   < > 9.0   MG 1.8 2.0 1.5*   < >  --    ALB  --   --   --   --  3.8   TBILI  --   --   --   --  0.9   SGOT  --   --   --   --  42*   ALT  --   --   --   --  26    < > = values in this interval not displayed.     Recent Labs     02/24/18  2148   PH 7.436   PCO2 29.4*   PO2 116.0*   HCO3 19.8       24 Hour Results:  Recent Results (from the past 24 hour(s))   CBC WITH AUTOMATED DIFF    Collection Time: 02/24/18  7:15 PM   Result Value Ref Range    WBC 16.4 (H) 4.0 - 11.0 1000/mm3    RBC 4.24 3.60 - 5.20 M/uL    HGB 11.3 (L) 13.0 - 17.2 gm/dl    HCT 34.8 (L) 37.0 - 50.0 %    MCV 82.1 80.0 - 98.0 fL    MCH 26.7 25.4 - 34.6 pg    MCHC 32.5 30.0 - 36.0 gm/dl    PLATELET 267 140 - 450 1000/mm3    MPV 13.5 (H) 6.0 - 10.0 fL    RDW-SD 42.0 36.4 - 46.3      NRBC 0 0 - 0      IMMATURE GRANULOCYTES 0.4 0.0 - 3.0 %    NEUTROPHILS 85.2 (H) 34 - 64 %    LYMPHOCYTES 11.6 (L) 28 - 48 %    MONOCYTES 2.4 1 - 13 %     EOSINOPHILS 0.0 0 - 5 %    BASOPHILS 0.4 0 - 3 %   METABOLIC PANEL, COMPREHENSIVE    Collection Time: 02/24/18  7:15 PM   Result Value Ref Range    Sodium 133 (L) 136 - 145 mEq/L    Potassium 4.5 3.5 - 5.1 mEq/L    Chloride 104 98 - 107 mEq/L    CO2 19 (L) 21 - 32 mEq/L    Glucose 298 (H) 74 - 106 mg/dl    BUN 11 7 - 25 mg/dl    Creatinine 1.2 0.6 - 1.3 mg/dl    GFR est AA >60.0      GFR est non-AA 55      Calcium 9.0 8.5 - 10.1 mg/dl    AST (SGOT) 42 (H) 15 - 37 U/L    ALT (SGPT) 26 12 - 78 U/L    Alk. phosphatase 102 45 - 117 U/L    Bilirubin, total 0.9 0.2 - 1.0 mg/dl    Protein, total 8.2 6.4 - 8.2 gm/dl    Albumin 3.8 3.4 - 5.0 gm/dl    Anion gap 10 5 - 15 mmol/L   LIPASE    Collection Time: 02/24/18  7:15 PM   Result Value Ref Range    Lipase 68 (L) 73 - 323 U/L   METABOLIC PANEL, BASIC  Collection Time: 02/24/18  9:34 PM   Result Value Ref Range    Sodium 134 (L) 136 - 145 mEq/L    Potassium 3.7 3.5 - 5.1 mEq/L    Chloride 106 98 - 107 mEq/L    CO2 20 (L) 21 - 32 mEq/L    Glucose 271 (H) 74 - 106 mg/dl    BUN 10 7 - 25 mg/dl    Creatinine 0.9 0.6 - 1.3 mg/dl    GFR est AA >60.0      GFR est non-AA >60      Calcium 8.5 8.5 - 10.1 mg/dl    Anion gap 8 5 - 15 mmol/L   MAGNESIUM    Collection Time: 02/24/18  9:34 PM   Result Value Ref Range    Magnesium 1.7 1.6 - 2.6 mg/dl   GLUCOSE, POC    Collection Time: 02/24/18  9:38 PM   Result Value Ref Range    Glucose (POC) 272 (H) 65 - 105 mg/dL   POC BLOOD GAS + LACTIC ACID    Collection Time: 02/24/18  9:48 PM   Result Value Ref Range    pH 7.436 7.350 - 7.450      PCO2 29.4 (L) 35.0 - 45.0 mm Hg    PO2 116.0 (H) 75 - 100 mm Hg    BICARBONATE 19.8 18.0 - 26.0 mmol/L    O2 SAT 99.0 90 - 100 %    CO2, TOTAL 21.0 (L) 24 - 29 mmol/L    BASE EXCESS -4 (L) -2 - 3 mmol/L    Patient temp. 98.4 F      Sample type Art      SITE R Radial      DEVICE Room Air      ALLENS TEST Pass     GLUCOSE, POC    Collection Time: 02/24/18 10:57 PM   Result Value Ref Range    Glucose (POC)  195 (H) 65 - 105 mg/dL   GLUCOSE, POC    Collection Time: 02/25/18  3:53 AM   Result Value Ref Range    Glucose (POC) 250 (H) 65 - 105 mg/dL   GLUCOSE, POC    Collection Time: 02/25/18  5:29 AM   Result Value Ref Range    Glucose (POC) 315 (H) 65 - 098 mg/dL   METABOLIC PANEL, BASIC    Collection Time: 02/25/18  5:31 AM   Result Value Ref Range    Sodium 134 (L) 136 - 145 mEq/L    Potassium 4.2 3.5 - 5.1 mEq/L    Chloride 107 98 - 107 mEq/L    CO2 16 (L) 21 - 32 mEq/L    Glucose 268 (H) 74 - 106 mg/dl    BUN 11 7 - 25 mg/dl    Creatinine 1.1 0.6 - 1.3 mg/dl    GFR est AA >60.0      GFR est non-AA >60      Calcium 8.2 (L) 8.5 - 10.1 mg/dl    Anion gap 11 5 - 15 mmol/L   MAGNESIUM    Collection Time: 02/25/18  5:31 AM   Result Value Ref Range    Magnesium 1.5 (L) 1.6 - 2.6 mg/dl   HEMOGLOBIN A1C W/O EAG    Collection Time: 02/25/18  5:31 AM   Result Value Ref Range    Hemoglobin A1c 9.2 (H) 4.2 - 5.6 %   CBC WITH AUTOMATED DIFF    Collection Time: 02/25/18  5:31 AM  Result Value Ref Range    WBC 23.4 (H) 4.0 - 11.0 1000/mm3    RBC 4.17 3.60 - 5.20 M/uL    HGB 11.2 (L) 13.0 - 17.2 gm/dl    HCT 34.9 (L) 37.0 - 50.0 %    MCV 83.7 80.0 - 98.0 fL    MCH 26.9 25.4 - 34.6 pg    MCHC 32.1 30.0 - 36.0 gm/dl    PLATELET 246 140 - 450 1000/mm3    MPV 12.7 (H) 6.0 - 10.0 fL    RDW-SD 44.4 36.4 - 46.3      NRBC 0 0 - 0      IMMATURE GRANULOCYTES 0.6 0.0 - 3.0 %    NEUTROPHILS 91.9 (H) 34 - 64 %    LYMPHOCYTES 6.5 (L) 28 - 48 %    MONOCYTES 0.9 (L) 1 - 13 %    EOSINOPHILS 0.0 0 - 5 %    BASOPHILS 0.1 0 - 3 %   GLUCOSE, POC    Collection Time: 02/25/18  7:41 AM   Result Value Ref Range    Glucose (POC) 121 (H) 65 - 409 mg/dL   METABOLIC PANEL, BASIC    Collection Time: 02/25/18  9:02 AM   Result Value Ref Range    Sodium 138 136 - 145 mEq/L    Potassium 3.8 3.5 - 5.1 mEq/L    Chloride 111 (H) 98 - 107 mEq/L    CO2 16 (L) 21 - 32 mEq/L    Glucose 87 74 - 106 mg/dl    BUN 14 7 - 25 mg/dl    Creatinine 1.1 0.6 - 1.3 mg/dl    GFR est  AA >60.0      GFR est non-AA >60      Calcium 8.1 (L) 8.5 - 10.1 mg/dl    Anion gap 11 5 - 15 mmol/L   MAGNESIUM    Collection Time: 02/25/18  9:02 AM   Result Value Ref Range    Magnesium 2.0 1.6 - 2.6 mg/dl   GLUCOSE, POC    Collection Time: 02/25/18 11:51 AM   Result Value Ref Range    Glucose (POC) 237 (H) 65 - 811 mg/dL   METABOLIC PANEL, BASIC    Collection Time: 02/25/18  3:48 PM   Result Value Ref Range    Sodium 136 136 - 145 mEq/L    Potassium 3.7 3.5 - 5.1 mEq/L    Chloride 106 98 - 107 mEq/L    CO2 19 (L) 21 - 32 mEq/L    Glucose 205 (H) 74 - 106 mg/dl    BUN 14 7 - 25 mg/dl    Creatinine 1.1 0.6 - 1.3 mg/dl    GFR est AA >60.0      GFR est non-AA >60      Calcium 8.4 (L) 8.5 - 10.1 mg/dl    Anion gap 11 5 - 15 mmol/L   MAGNESIUM    Collection Time: 02/25/18  3:48 PM   Result Value Ref Range    Magnesium 1.8 1.6 - 2.6 mg/dl   GLUCOSE, POC    Collection Time: 02/25/18  4:15 PM   Result Value Ref Range    Glucose (POC) 217 (H) 65 - 105 mg/dL   DRUG SCREEN, URINE    Collection Time: 02/25/18  4:20 PM   Result Value Ref Range    Amphetamine NEGATIVE NEGATIVE      Barbiturates NEGATIVE NEGATIVE      Benzodiazepines NEGATIVE NEGATIVE  Cocaine NEGATIVE NEGATIVE      Marijuana POSITIVE (A) NEGATIVE      Methadone NEGATIVE NEGATIVE      Opiates NEGATIVE NEGATIVE      Phencyclidine NEGATIVE NEGATIVE         No results found.    Microbiology:  All Micro Results     None           Cardiology:  Results for orders placed or performed during the hospital encounter of 04/20/16   EKG, 12 LEAD, INITIAL   Result Value Ref Range    Ventricular Rate 93 BPM    Atrial Rate 93 BPM    P-R Interval 114 ms    QRS Duration 82 ms    Q-T Interval 370 ms    QTC Calculation (Bezet) 460 ms    Calculated P Axis 75 degrees    Calculated R Axis 71 degrees    Calculated T Axis 58 degrees    Diagnosis       Normal sinus rhythm    When compared with ECG of 05-May-2014 09:08,  Nonspecific T wave abnormality no longer evident in Inferior  leads  Confirmed by Judie Grieve, M.D., [place] ([id number]) on 04/21/2016 7:30:54 AM           Problem List:  Problem List as of 02/25/2018 Date Reviewed: Feb 23, 2015          Codes Class Noted - Resolved    Intractable vomiting ICD-10-CM: R11.10  ICD-9-CM: 536.2  08/08/2017 - Present        Gastritis ICD-10-CM: K29.70  ICD-9-CM: 535.50  08/08/2017 - Present        Slow transit constipation ICD-10-CM: K59.01  ICD-9-CM: 564.01  08/08/2017 - Present        Marijuana use ICD-10-CM: F12.90  ICD-9-CM: 305.20  08/08/2017 - Present        Cyclical vomiting WNU-27-OZ: R11.15  ICD-9-CM: 536.2  07/09/2017 - Present        Leukocytosis ICD-10-CM: D72.829  ICD-9-CM: 288.60  12/30/2016 - Present        Insulin long-term use ([place]) ICD-10-CM: Z79.4  ICD-9-CM: V58.67  11/20/2016 - Present        Essential hypertension ICD-10-CM: I10  ICD-9-CM: 401.9  03/27/2016 - Present    Overview Signed 08/08/2017  7:22 PM by Hilton Sinclair, PA-C     Amlodipine, carvedilol, lisinopril.             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 Was supposed to follow-up with implanting gastroenterologist and [facility].             Intractable nausea and vomiting ICD-10-CM: R11.2  ICD-9-CM: 536.2  06/02/2014 - Present        Type 1 diabetes mellitus ([place]) ICD-10-CM: E10.9  ICD-9-CM: 250.01  02/17/2009 - Present    Overview Signed 08/08/2017  7:22 PM by Hilton Sinclair, PA-C     Diagnosed  at age 74. Had insulin pump at one time, but developed keloids at needle insertion sites.  Was supposed to establish with [facility].             RESOLVED: Diabetic ketoacidosis ([place]) ICD-10-CM: E11.10  ICD-9-CM: 250.10  05/03/2014 - 07/09/2014        RESOLVED: DKA (diabetic ketoacidoses) ([place]) ICD-10-CM: E11.10  ICD-9-CM: 250.10  04/05/2014 - 07/09/2014              Medications reviewed  Current Facility-Administered Medications   Medication Dose Route Frequency   ??? promethazine (PHENERGAN) 25 mg in NS IVPB  25 mg IntraVENous Q6H PRN   ??? ondansetron (ZOFRAN) injection 4 mg  4 mg IntraVENous Q4H PRN   ??? dextrose (D50) infusion 5-25 g  10-50 mL IntraVENous PRN   ??? glucagon (GLUCAGEN) injection 1 mg  1 mg IntraMUSCular PRN   ??? insulin glargine (LANTUS) injection 1-100 Units  1-100 Units SubCUTAneous QHS   ??? insulin lispro (HUMALOG) injection   SubCUTAneous AC&HS   ??? insulin lispro (HUMALOG) injection 1-100 Units  1-100 Units SubCUTAneous PRN   ??? sodium chloride (NS) flush 5-10 mL  5-10 mL IntraVENous Q8H   ??? sodium chloride (NS) flush 5-10 mL  5-10 mL IntraVENous PRN   ??? naloxone (NARCAN) injection 0.1 mg  0.1 mg IntraVENous PRN   ??? enoxaparin (LOVENOX) injection 40 mg  40 mg SubCUTAneous Q24H   ??? pantoprazole (PROTONIX) 40 mg in 0.9% sodium chloride 10 mL injection  40 mg IntraVENous Q12H   ??? 0.9% sodium chloride infusion  100 mL/hr IntraVENous CONTINUOUS   ??? lisinopril (PRINIVIL, ZESTRIL) tablet 20 mg  20 mg Oral DAILY         Kristen Loader, MD

## 2018-02-25 NOTE — Progress Notes (Signed)
Pt was seen walking up and down the hall. Pt has requested something for pain 8/10. 1x dose of dilaudid was given. Pt is now on a diabetic diet and is requesting something to eat. Pt is tolerating a [place] and diet ginger rale. Will cont to monitor.      Tele monitoring has been d/c.

## 2018-02-25 NOTE — ED Notes (Signed)
 TRANSFER - OUT REPORT:    Verbal report given to Char, RN on [name] [name]  being transferred to 2W for routine progression of care       Report consisted of patient's Situation, Background, Assessment and   Recommendations(SBAR).     Information from the following report(s) SBAR was reviewed with the receiving nurse.    Lines:   Peripheral IV 02/24/18 Right;Upper Arm (Active)   Site Assessment Clean, dry, & intact 02/24/2018  7:44 PM   Phlebitis Assessment 0 02/24/2018  7:44 PM   Infiltration Assessment 0 02/24/2018  7:44 PM   Dressing Status Clean, dry, & intact 02/24/2018  7:44 PM   Dressing Type Transparent 02/24/2018  7:44 PM   Hub Color/Line Status Pink 02/24/2018  7:44 PM   Alcohol Cap Used Yes 02/24/2018  7:44 PM       Peripheral IV 02/24/18 Left;Upper Cephalic (Active)   Site Assessment Clean, dry, & intact 02/24/2018  7:45 PM   Phlebitis Assessment 0 02/24/2018  7:45 PM   Infiltration Assessment 0 02/24/2018  7:45 PM   Dressing Status Clean, dry, & intact 02/24/2018  7:45 PM   Dressing Type Transparent;Tape 02/24/2018  7:45 PM   Hub Color/Line Status Flushed 02/24/2018  7:45 PM        Opportunity for questions and clarification was provided.      Patient transported with:   Registered Nurse

## 2018-02-25 NOTE — Progress Notes (Signed)
Problem: Falls - Risk of  Goal: *Absence of Falls  Description  Document Schmid Fall Risk and appropriate interventions in the flowsheet.  Outcome: Progressing Towards Goal  Note: Fall Risk Interventions:            Medication Interventions: Patient to call before getting OOB, Teach patient to arise slowly

## 2018-02-25 NOTE — Other (Signed)
Bedside and Verbal shift change report given to Char, Charity fundraiser (Cabin crew) by Malachi Carl RN (offgoing nurse). Report included the following information SBAR, MAR, Recent Results and Cardiac Rhythm Sinus Tach Box #[id number].

## 2018-02-25 NOTE — Progress Notes (Addendum)
PAGER ID: [phone]   MESSAGE: [phone]; Gehret, Patra Dx Intractable vomiting; HTN (attending Morris) Pt BP is elevated, could be due to pain. Pt is requesting something for pain. Allergies to morphine, robaxin, PCN and Zithromax. Char Ext [phone]    @ 0607 Received callback for 1x dose of tylenol 100mg IV. From Dr. Morris.    @ 0652 Current BP 171/89 HR 108

## 2018-02-25 NOTE — Other (Addendum)
Pt B/P has been running high, latest 176/83.  Pt usually takes Lisinopril, but no dosage listed in PTA meds.  RN asked pt to verify dosage; she believes she takes 20 or 25mg  daily.  Pt stated nausea has been getting better, feels she will be able to tolerate PO meds.

## 2018-02-25 NOTE — Progress Notes (Addendum)
Pt was seen walking up and down the hall. Pt has requested something for pain 8/10. 1x dose of dilaudid was given. Pt is now on a diabetic diet and is requesting something to eat. Pt is tolerating a turkey and diet ginger rale. Will cont to monitor.      Tele monitoring has been d/c.

## 2018-02-25 NOTE — ED Notes (Signed)
Pt's best friend Brittany Creekmore would like to be contacted when pt moved to IP bed. Brittany's phone number is [phone].   Per Brittany time is not a factor as she may be reached at any time.

## 2018-02-25 NOTE — Progress Notes (Signed)
Hospitalist Progress Note  Kristen Loader, MD  [facility]    Daily Progress Note: 02/25/2018    Assessment/Plan:     Intractable nausea and vomiting due to cyclic vomiting syndrome and diabetic gastroparesis  Likely due to her diabetic gastroparesis.    Patient does have a positive test on urine drug screen for marijuana  Patient know that she has not had any marijuana in about 3 weeks  Patient has a gastric stimulator in place  We will continue Zofran PRN  Continue IVF    DM type I  Patient initially planned for on insulin drip but then transitioned to Glucomander SQ  Glucose levels controlled  Advance diet    DM gastroparesis   S/p gastric stimulator  Patient has Zofran  May consider Reglan on top of the stimulator  Patient says she can feel the stimulator shocking her sometimes    HTN  Resume lisinopril 20 mg daily    DVT prophylaxis  SCDs    CODE STATUS.  Full code.    Disposition.  Patient appears to be improving we will see how she does overnight and with the addition of regular feeds.  Hopefully home soon.    ------------------    36 minutes were spent on the patient of which more than 50% was spent in coordination of care and counseling (time spent with patient/family face to face, physical exam, reviewing laboratory and imaging investigations, speaking with physicians and nursing staff involved in this patient's care)    ------------------  Physical exam:  General:  Alert, NAD, pleasant.  Cooperative. Thin. Many Tattoos  HEENT: Sclera anicteric, PERRL, OM moist, throat clear.  Chest:  CTA B, no wheeze, no rales, no rhonchi.  Good air movement.  CV: RRR, S1/S2 were normal, no murmur  Abdomen: NTND, soft, NABS, no masses were noted.  No rebound, no guarding.  Extremities: No edema.    Subjective:   Pt noted feeling better but with abd pain. We discussed DM gastroparesis and her gastric stimulator.  We discussed dosing a single dose of Dilaudid  to help the abdominal pain.  We also discussed advancing her diet      Objective:     Visit Vitals  BP (!) 175/97 (BP 1 Location: Right arm, BP Patient Position: Sitting)   Pulse (!) 104   Temp 98.8 ??F (37.1 ??C)   Resp 18   Ht '5\' 6"'$  (1.676 m)   Wt 51.7 kg (114 lb)   LMP 01/30/2018 (Approximate)   SpO2 100%   Breastfeeding No   BMI 18.40 kg/m??      O2 Device: Room air    Temp (24hrs), Avg:98.7 ??F (37.1 ??C), Min:98.2 ??F (36.8 ??C), Max:99.1 ??F (37.3 ??C)    01/30 0701 - 01/30 1900  In: 840 [P.O.:840]  Out: 910 [Urine:910]   01/28 1901 - 01/30 0700  In: 3454.2 [I.V.:3454.2]  Out: -       Data Review:       Recent Days:  Recent Labs     02/25/18  0531 02/24/18  1915 02/24/18  0932   WBC 23.4* 16.4* 12.2*   HGB 11.2* 11.3* 12.1*   HCT 34.9* 34.8* 37.8   PLT 246 267 277     Recent Labs     02/25/18  1548 02/25/18  0902 02/25/18  0531  02/24/18  1915   NA 136 138 134*   < > 133*   K 3.7 3.8 4.2   < > 4.5  CL 106 111* 107   < > 104   CO2 19* 16* 16*   < > 19*   GLU 205* 87 268*   < > 298*   BUN '14 14 11   '$ < > 11   CREA 1.1 1.1 1.1   < > 1.2   CA 8.4* 8.1* 8.2*   < > 9.0   MG 1.8 2.0 1.5*   < >  --    ALB  --   --   --   --  3.8   TBILI  --   --   --   --  0.9   SGOT  --   --   --   --  42*   ALT  --   --   --   --  26    < > = values in this interval not displayed.     Recent Labs     02/24/18  2148   PH 7.436   PCO2 29.4*   PO2 116.0*   HCO3 19.8       24 Hour Results:  Recent Results (from the past 24 hour(s))   CBC WITH AUTOMATED DIFF    Collection Time: 02/24/18  7:15 PM   Result Value Ref Range    WBC 16.4 (H) 4.0 - 11.0 1000/mm3    RBC 4.24 3.60 - 5.20 M/uL    HGB 11.3 (L) 13.0 - 17.2 gm/dl    HCT 34.8 (L) 37.0 - 50.0 %    MCV 82.1 80.0 - 98.0 fL    MCH 26.7 25.4 - 34.6 pg    MCHC 32.5 30.0 - 36.0 gm/dl    PLATELET 267 140 - 450 1000/mm3    MPV 13.5 (H) 6.0 - 10.0 fL    RDW-SD 42.0 36.4 - 46.3      NRBC 0 0 - 0      IMMATURE GRANULOCYTES 0.4 0.0 - 3.0 %    NEUTROPHILS 85.2 (H) 34 - 64 %     LYMPHOCYTES 11.6 (L) 28 - 48 %    MONOCYTES 2.4 1 - 13 %    EOSINOPHILS 0.0 0 - 5 %    BASOPHILS 0.4 0 - 3 %   METABOLIC PANEL, COMPREHENSIVE    Collection Time: 02/24/18  7:15 PM   Result Value Ref Range    Sodium 133 (L) 136 - 145 mEq/L    Potassium 4.5 3.5 - 5.1 mEq/L    Chloride 104 98 - 107 mEq/L    CO2 19 (L) 21 - 32 mEq/L    Glucose 298 (H) 74 - 106 mg/dl    BUN 11 7 - 25 mg/dl    Creatinine 1.2 0.6 - 1.3 mg/dl    GFR est AA >60.0      GFR est non-AA 55      Calcium 9.0 8.5 - 10.1 mg/dl    AST (SGOT) 42 (H) 15 - 37 U/L    ALT (SGPT) 26 12 - 78 U/L    Alk. phosphatase 102 45 - 117 U/L    Bilirubin, total 0.9 0.2 - 1.0 mg/dl    Protein, total 8.2 6.4 - 8.2 gm/dl    Albumin 3.8 3.4 - 5.0 gm/dl    Anion gap 10 5 - 15 mmol/L   LIPASE    Collection Time: 02/24/18  7:15 PM   Result Value Ref Range    Lipase 68 (L) 73 - 784 U/L   METABOLIC PANEL, BASIC  Collection Time: 02/24/18  9:34 PM   Result Value Ref Range    Sodium 134 (L) 136 - 145 mEq/L    Potassium 3.7 3.5 - 5.1 mEq/L    Chloride 106 98 - 107 mEq/L    CO2 20 (L) 21 - 32 mEq/L    Glucose 271 (H) 74 - 106 mg/dl    BUN 10 7 - 25 mg/dl    Creatinine 0.9 0.6 - 1.3 mg/dl    GFR est AA >60.0      GFR est non-AA >60      Calcium 8.5 8.5 - 10.1 mg/dl    Anion gap 8 5 - 15 mmol/L   MAGNESIUM    Collection Time: 02/24/18  9:34 PM   Result Value Ref Range    Magnesium 1.7 1.6 - 2.6 mg/dl   GLUCOSE, POC    Collection Time: 02/24/18  9:38 PM   Result Value Ref Range    Glucose (POC) 272 (H) 65 - 105 mg/dL   POC BLOOD GAS + LACTIC ACID    Collection Time: 02/24/18  9:48 PM   Result Value Ref Range    pH 7.436 7.350 - 7.450      PCO2 29.4 (L) 35.0 - 45.0 mm Hg    PO2 116.0 (H) 75 - 100 mm Hg    BICARBONATE 19.8 18.0 - 26.0 mmol/L    O2 SAT 99.0 90 - 100 %    CO2, TOTAL 21.0 (L) 24 - 29 mmol/L    BASE EXCESS -4 (L) -2 - 3 mmol/L    Patient temp. 98.4 F      Sample type Art      SITE R Radial      DEVICE Room Air      ALLENS TEST Pass     GLUCOSE, POC     Collection Time: 02/24/18 10:57 PM   Result Value Ref Range    Glucose (POC) 195 (H) 65 - 105 mg/dL   GLUCOSE, POC    Collection Time: 02/25/18  3:53 AM   Result Value Ref Range    Glucose (POC) 250 (H) 65 - 105 mg/dL   GLUCOSE, POC    Collection Time: 02/25/18  5:29 AM   Result Value Ref Range    Glucose (POC) 315 (H) 65 - 562 mg/dL   METABOLIC PANEL, BASIC    Collection Time: 02/25/18  5:31 AM   Result Value Ref Range    Sodium 134 (L) 136 - 145 mEq/L    Potassium 4.2 3.5 - 5.1 mEq/L    Chloride 107 98 - 107 mEq/L    CO2 16 (L) 21 - 32 mEq/L    Glucose 268 (H) 74 - 106 mg/dl    BUN 11 7 - 25 mg/dl    Creatinine 1.1 0.6 - 1.3 mg/dl    GFR est AA >60.0      GFR est non-AA >60      Calcium 8.2 (L) 8.5 - 10.1 mg/dl    Anion gap 11 5 - 15 mmol/L   MAGNESIUM    Collection Time: 02/25/18  5:31 AM   Result Value Ref Range    Magnesium 1.5 (L) 1.6 - 2.6 mg/dl   HEMOGLOBIN A1C W/O EAG    Collection Time: 02/25/18  5:31 AM   Result Value Ref Range    Hemoglobin A1c 9.2 (H) 4.2 - 5.6 %   CBC WITH AUTOMATED DIFF    Collection Time: 02/25/18  5:31 AM  Result Value Ref Range    WBC 23.4 (H) 4.0 - 11.0 1000/mm3    RBC 4.17 3.60 - 5.20 M/uL    HGB 11.2 (L) 13.0 - 17.2 gm/dl    HCT 34.9 (L) 37.0 - 50.0 %    MCV 83.7 80.0 - 98.0 fL    MCH 26.9 25.4 - 34.6 pg    MCHC 32.1 30.0 - 36.0 gm/dl    PLATELET 246 140 - 450 1000/mm3    MPV 12.7 (H) 6.0 - 10.0 fL    RDW-SD 44.4 36.4 - 46.3      NRBC 0 0 - 0      IMMATURE GRANULOCYTES 0.6 0.0 - 3.0 %    NEUTROPHILS 91.9 (H) 34 - 64 %    LYMPHOCYTES 6.5 (L) 28 - 48 %    MONOCYTES 0.9 (L) 1 - 13 %    EOSINOPHILS 0.0 0 - 5 %    BASOPHILS 0.1 0 - 3 %   GLUCOSE, POC    Collection Time: 02/25/18  7:41 AM   Result Value Ref Range    Glucose (POC) 121 (H) 65 - 607 mg/dL   METABOLIC PANEL, BASIC    Collection Time: 02/25/18  9:02 AM   Result Value Ref Range    Sodium 138 136 - 145 mEq/L    Potassium 3.8 3.5 - 5.1 mEq/L    Chloride 111 (H) 98 - 107 mEq/L    CO2 16 (L) 21 - 32 mEq/L     Glucose 87 74 - 106 mg/dl    BUN 14 7 - 25 mg/dl    Creatinine 1.1 0.6 - 1.3 mg/dl    GFR est AA >60.0      GFR est non-AA >60      Calcium 8.1 (L) 8.5 - 10.1 mg/dl    Anion gap 11 5 - 15 mmol/L   MAGNESIUM    Collection Time: 02/25/18  9:02 AM   Result Value Ref Range    Magnesium 2.0 1.6 - 2.6 mg/dl   GLUCOSE, POC    Collection Time: 02/25/18 11:51 AM   Result Value Ref Range    Glucose (POC) 237 (H) 65 - 371 mg/dL   METABOLIC PANEL, BASIC    Collection Time: 02/25/18  3:48 PM   Result Value Ref Range    Sodium 136 136 - 145 mEq/L    Potassium 3.7 3.5 - 5.1 mEq/L    Chloride 106 98 - 107 mEq/L    CO2 19 (L) 21 - 32 mEq/L    Glucose 205 (H) 74 - 106 mg/dl    BUN 14 7 - 25 mg/dl    Creatinine 1.1 0.6 - 1.3 mg/dl    GFR est AA >60.0      GFR est non-AA >60      Calcium 8.4 (L) 8.5 - 10.1 mg/dl    Anion gap 11 5 - 15 mmol/L   MAGNESIUM    Collection Time: 02/25/18  3:48 PM   Result Value Ref Range    Magnesium 1.8 1.6 - 2.6 mg/dl   GLUCOSE, POC    Collection Time: 02/25/18  4:15 PM   Result Value Ref Range    Glucose (POC) 217 (H) 65 - 105 mg/dL   DRUG SCREEN, URINE    Collection Time: 02/25/18  4:20 PM   Result Value Ref Range    Amphetamine NEGATIVE NEGATIVE      Barbiturates NEGATIVE NEGATIVE      Benzodiazepines NEGATIVE NEGATIVE  Cocaine NEGATIVE NEGATIVE      Marijuana POSITIVE (A) NEGATIVE      Methadone NEGATIVE NEGATIVE      Opiates NEGATIVE NEGATIVE      Phencyclidine NEGATIVE NEGATIVE         No results found.    Microbiology:  All Micro Results     None           Cardiology:  Results for orders placed or performed during the hospital encounter of 04/20/16   EKG, 12 LEAD, INITIAL   Result Value Ref Range    Ventricular Rate 93 BPM    Atrial Rate 93 BPM    P-R Interval 114 ms    QRS Duration 82 ms    Q-T Interval 370 ms    QTC Calculation (Bezet) 460 ms    Calculated P Axis 75 degrees    Calculated R Axis 71 degrees    Calculated T Axis 58 degrees    Diagnosis       Normal sinus rhythm     When compared with ECG of 05-May-2014 09:08,  Nonspecific T wave abnormality no longer evident in Inferior leads  Confirmed by Judie Grieve, M.D., [place] ([id number]) on 04/21/2016 7:30:54 AM           Problem List:  Problem List as of 02/25/2018 Date Reviewed: 03/02/2015          Codes Class Noted - Resolved    Intractable vomiting ICD-10-CM: R11.10  ICD-9-CM: 536.2  08/08/2017 - Present        Gastritis ICD-10-CM: K29.70  ICD-9-CM: 535.50  08/08/2017 - Present        Slow transit constipation ICD-10-CM: K59.01  ICD-9-CM: 564.01  08/08/2017 - Present        Marijuana use ICD-10-CM: F12.90  ICD-9-CM: 305.20  08/08/2017 - Present        Cyclical vomiting CBJ-62-GB: R11.15  ICD-9-CM: 536.2  07/09/2017 - Present        Leukocytosis ICD-10-CM: D72.829  ICD-9-CM: 288.60  12/30/2016 - Present        Insulin long-term use ([place]) ICD-10-CM: Z79.4  ICD-9-CM: V58.67  11/20/2016 - Present        Essential hypertension ICD-10-CM: I10  ICD-9-CM: 401.9  03/27/2016 - Present    Overview Signed 08/08/2017  7:22 PM by Hilton Sinclair, PA-C     Amlodipine, carvedilol, lisinopril.             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 Was supposed to follow-up with implanting gastroenterologist and [facility].             Intractable nausea and vomiting ICD-10-CM: R11.2  ICD-9-CM: 536.2  06/02/2014 - Present        Type 1 diabetes mellitus ([place]) ICD-10-CM: E10.9  ICD-9-CM: 250.01  02/17/2009 - Present     Overview Signed 08/08/2017  7:22 PM by Hilton Sinclair, PA-C  Diagnosed at age 62. Had insulin pump at one time, but developed keloids at needle insertion sites. Was supposed to establish with [facility].             RESOLVED: Diabetic ketoacidosis ([place]) ICD-10-CM: E11.10  ICD-9-CM: 250.10  05/03/2014 - 07/09/2014        RESOLVED: DKA (diabetic ketoacidoses) ([place]) ICD-10-CM: E11.10  ICD-9-CM: 250.10  04/05/2014 - 07/09/2014              Medications reviewed  Current Facility-Administered Medications   Medication Dose Route Frequency   ??? promethazine (PHENERGAN) 25 mg in NS IVPB  25 mg IntraVENous Q6H PRN   ??? ondansetron (ZOFRAN) injection 4 mg  4 mg IntraVENous Q4H PRN   ??? dextrose (D50) infusion 5-25 g  10-50 mL IntraVENous PRN   ??? glucagon (GLUCAGEN) injection 1 mg  1 mg IntraMUSCular PRN   ??? insulin glargine (LANTUS) injection 1-100 Units  1-100 Units SubCUTAneous QHS   ??? insulin lispro (HUMALOG) injection   SubCUTAneous AC&HS   ??? insulin lispro (HUMALOG) injection 1-100 Units  1-100 Units SubCUTAneous PRN   ??? sodium chloride (NS) flush 5-10 mL  5-10 mL IntraVENous Q8H   ??? sodium chloride (NS) flush 5-10 mL  5-10 mL IntraVENous PRN   ??? naloxone (NARCAN) injection 0.1 mg  0.1 mg IntraVENous PRN   ??? enoxaparin (LOVENOX) injection 40 mg  40 mg SubCUTAneous Q24H   ??? pantoprazole (PROTONIX) 40 mg in 0.9% sodium chloride 10 mL injection  40 mg IntraVENous Q12H   ??? 0.9% sodium chloride infusion  100 mL/hr IntraVENous CONTINUOUS   ??? lisinopril (PRINIVIL, ZESTRIL) tablet 20 mg  20 mg Oral DAILY         Kristen Loader, MD

## 2018-02-25 NOTE — Progress Notes (Signed)
NUTRITION GLYCEMIC CONTROL EVALUATION    Education history:   [] No prior education  [] Inpatient education  [x] Outpatient education  [] Other:   [] Unknown     Current diet order: DIET DIABETIC CLEAR LIQUID    Diet history: 2-3 meals/day, per pt she counts carbs- pt unwilling to provide much information at this time    Compliance history:    [] Compliant     [] Inconsistent/limited compliance     [] Noncompliant  [x] Unknown     Biochemical data:  ?? BS at hospital: 131-315 on glucommander   HgbA1c:   Lab Results   Component Value Date/Time    Hemoglobin A1c 9.2 (H) 02/25/2018 05:31 AM    Hemoglobin A1c 9.1 (H) 08/08/2017 09:28 AM    Hemoglobin A1c 10.1 (H) 10/12/2014 01:40 PM   ??   ?? Average BS at home: 120-180    Current meds affecting BS:   [] Insulin (drip)  [x] Insulin (subq)  [] Insulin (pump)  [] Oral meds  [] Steroids  [] IV dextrose fluids  [] Other:    Home meds affecting BS:   [] Insulin (long acting)  [x] Insulin (short/intermediate acting)  [] Insulin (pump)  [] Oral meds  [] Steroids  [] Other:    Education needed:  Pt refused  ______________________________________________________________________    Comments: Pt refused education.  Provided handout to patient and notified pt available as needed.       Rachel Sheldon, RD  02/25/18

## 2018-02-25 NOTE — Other (Signed)
Hat and specimen cup placed in bathroom; pt notified of urine sample needed.

## 2018-02-25 NOTE — H&P (Signed)
History and Physical    DOS: February 25, 2018    Patient: [name] [name]               Sex: female          DOA: 02/24/2018       Date of Birth:  05-Aug-1985      Age:  33 y.o.        LOS:  LOS: 0 days       MRN: [id number]       Impression/Plan           33 y.o. female with hx of HTN, IDDM, Gastroparesis, and Cyclic Vomiting syndrome with the following:        1.  Intractable nausea and vomiting   2.  Cyclic vomiting syndrome   3.  IDDM   4.  Diabetic gastroparesis   5.  HTN      PLAN:     Admit to OBS, tele  O2 as needed.  GI and DVT prophylaxis.  Replace electrolytes as needed.    Symptomatic treatment for nausea and vomiting.  IV Protonix 40 mg twice daily.  Monitor blood glucose.  Glucomander per protocol.  Consider GI consult in a.m.  Hydrate with IV fluids and monitor to avoid fluid overload.  Continue home medications when possible.  Further recommendations based on test results, response to treatment, and clinical course.    Chief Complaint:     Chief Complaint   Patient presents with   ??? Abdominal Pain   ??? Vomiting   ??? Nausea       HPI:       [name] [name] is a 33 y.o. female with hx of HTN, IDDM, Gastroparesis, and Cyclic Vomiting syndrome with c/o persistent intractable nausea and vomiting since around 12 noon.  Patient denies any fever or chills.  She states that she was seen in the ED for hypoglycemia and subsequently discharged home.  Her symptoms began after she returned home.  She has had abdominal pain which she describes as stabbing in nature.  Zofran per EMS did not provide relief.  Patient was initially placed on IV insulin drip per protocol with plans to  transition to SQ insulin with improvement of her overall condition.         Past Medical History:   Diagnosis Date   ??? Cyclical vomiting 8/65/7846   ??? Diabetes ([place])    ??? Diabetic ketoacidosis without coma associated with type 1 diabetes mellitus ([place]) 11/05/2015   ??? Drug-seeking behavior    ??? Essential hypertension 03/27/2016     Amlodipine, carvedilol, lisinopril.   ??? Gastroparesis    ??? Gastroparesis    ??? Hypertension    ??? Insulin long-term use ([place]) 11/20/2016   ??? Slow transit constipation 08/08/2017   ??? Type 1 diabetes mellitus ([place]) 02/17/2009    Diagnosed at age 8. Had insulin pump at one time, but developed keloids at needle insertion sites. Was supposed to establish with [facility].       Past Surgical History:   Procedure Laterality Date   ??? ABDOMEN SURGERY PROC UNLISTED  05/2015    Gastric stimulator pump    ??? HX CESAREAN SECTION         Family History   Problem Relation Age of Onset   ??? Diabetes Father    ??? Stroke Other         great grandmother       Social History  Socioeconomic History   ??? Marital status: SINGLE     Spouse name: Not on file   ??? Number of children: Not on file   ??? Years of education: Not on file   ??? Highest education level: Not on file   Tobacco Use   ??? Smoking status: Never Smoker   ??? Smokeless tobacco: Never Used   Substance and Sexual Activity   ??? Alcohol use: No   ??? Drug use: Yes     Types: Marijuana     Comment: last use 02/02/2018   ??? Sexual activity: Yes     Birth control/protection: Injection       Prior to Admission medications    Medication Sig Start Date End Date Taking? Authorizing Provider   LISINOPRIL PO Take  by mouth daily.   Yes Other, Phys, MD   insulin aspart U-100 (NOVOLOG FLEXPEN U-100 INSULIN) 100 unit/mL (3 mL) inpn Before breakfast, lunch, dinner and at bedtime. Inject subcutaneously per sliding scale 3 times a day.   Yes Other, Phys, MD   insulin glargine (LANTUS,BASAGLAR) 100 unit/mL (3 mL) inpn 28 Units by SubCUTAneous route every evening.   Yes Other, Phys, MD       Allergies   Allergen Reactions   ??? Morphine Itching     Pt denies allergy   ??? Robaxin [Methocarbamol] Other (comments)     Body shaking, nausea   ??? Penicillins Hives   ??? Zithromax [Azithromycin] Hives       Review of Systems:    1) See above. 2) A 12 point review of systems has been obtained. ROS is  otherwise noncontributory.      Physical Exam:      Visit Vitals  BP (!) 211/97 (BP 1 Location: Right arm, BP Patient Position: Head of bed elevated (Comment degrees))   Pulse 91   Temp 98.4 ??F (36.9 ??C)   Resp 13   Ht '5\' 6"'$  (1.676 m)   Wt 51.7 kg (114 lb)   SpO2 99%   BMI 18.40 kg/m??         Intake/Output Summary (Last 24 hours) at 02/25/2018 0043  Last data filed at 02/24/2018 2035  Gross per 24 hour   Intake 1000 ml   Output ???   Net 1000 ml       Physical Exam:    HEENT: NC/AT, PERRLA, EOMI, dry mucous membranes  Neck: No JVD no bruits, supple, nontender, no significant LAD  LYMPH: No supraclavicular or cervical or axillary nodes on both sides  Cardiovascular: Heart, RRR, no M, R, G  Lungs: Decreased breath sounds at bases, coarse upper airway sounds, no wheezes or crackles  Abd: Soft, diffuse discomfort to palpation,  not distended, No guarding, No rigidity, BS normal  NEURO:  Non focal, normal strength. CN II - XII intact bilaterally   Extrm: no leg edema   Skin: No rashes or lesions       Labs Reviewed:     Recent Results (from the past 24 hour(s))   GLUCOSE, POC    Collection Time: 02/24/18  8:47 AM   Result Value Ref Range    Glucose (POC) 81 65 - 105 mg/dL   POC URINE MACROSCOPIC    Collection Time: 02/24/18  9:27 AM   Result Value Ref Range    Glucose 500 (A) NEGATIVE,Negative mg/dl    Bilirubin Negative NEGATIVE,Negative      Ketone Negative NEGATIVE,Negative mg/dl    Specific gravity 1.015 1.005 - 1.030  Blood Negative NEGATIVE,Negative      pH (UA) 6.5 5 - 9      Protein 30 (A) NEGATIVE,Negative mg/dl    Urobilinogen 0.2 0.0 - 1.0 EU/dl    Nitrites Negative NEGATIVE,Negative      Leukocyte Esterase Negative NEGATIVE,Negative      Color Light yellow      Appearance Slightly Cloudy     POC HCG,URINE    Collection Time: 02/24/18  9:32 AM   Result Value Ref Range    HCG urine, QL negative NEGATIVE,Negative,negative     CBC WITH AUTOMATED DIFF    Collection Time: 02/24/18  9:32 AM    Result Value Ref Range    WBC 12.2 (H) 4.0 - 11.0 1000/mm3    RBC 4.50 3.60 - 5.20 M/uL    HGB 12.1 (L) 13.0 - 17.2 gm/dl    HCT 37.8 37.0 - 50.0 %    MCV 84.0 80.0 - 98.0 fL    MCH 26.9 25.4 - 34.6 pg    MCHC 32.0 30.0 - 36.0 gm/dl    PLATELET 277 140 - 450 1000/mm3    MPV 12.2 (H) 6.0 - 10.0 fL    RDW-SD 43.6 36.4 - 46.3      NRBC 0 0 - 0      IMMATURE GRANULOCYTES 0.3 0.0 - 3.0 %    NEUTROPHILS 83.3 (H) 34 - 64 %    LYMPHOCYTES 13.2 (L) 28 - 48 %    MONOCYTES 2.8 1 - 13 %    EOSINOPHILS 0.0 0 - 5 %    BASOPHILS 0.4 0 - 3 %   METABOLIC PANEL, BASIC    Collection Time: 02/24/18  9:32 AM   Result Value Ref Range    Sodium 135 (L) 136 - 145 mEq/L    Potassium 3.6 3.5 - 5.1 mEq/L    Chloride 104 98 - 107 mEq/L    CO2 26 21 - 32 mEq/L    Glucose 159 (H) 74 - 106 mg/dl    BUN 11 7 - 25 mg/dl    Creatinine 1.1 0.6 - 1.3 mg/dl    GFR est AA >60.0      GFR est non-AA >60      Calcium 8.6 8.5 - 10.1 mg/dl    Anion gap 5 5 - 15 mmol/L   CBC WITH AUTOMATED DIFF    Collection Time: 02/24/18  7:15 PM   Result Value Ref Range    WBC 16.4 (H) 4.0 - 11.0 1000/mm3    RBC 4.24 3.60 - 5.20 M/uL    HGB 11.3 (L) 13.0 - 17.2 gm/dl    HCT 34.8 (L) 37.0 - 50.0 %    MCV 82.1 80.0 - 98.0 fL    MCH 26.7 25.4 - 34.6 pg    MCHC 32.5 30.0 - 36.0 gm/dl    PLATELET 267 140 - 450 1000/mm3    MPV 13.5 (H) 6.0 - 10.0 fL    RDW-SD 42.0 36.4 - 46.3      NRBC 0 0 - 0      IMMATURE GRANULOCYTES 0.4 0.0 - 3.0 %    NEUTROPHILS 85.2 (H) 34 - 64 %    LYMPHOCYTES 11.6 (L) 28 - 48 %    MONOCYTES 2.4 1 - 13 %    EOSINOPHILS 0.0 0 - 5 %    BASOPHILS 0.4 0 - 3 %   METABOLIC PANEL, COMPREHENSIVE    Collection Time: 02/24/18  7:15 PM   Result  Value Ref Range    Sodium 133 (L) 136 - 145 mEq/L    Potassium 4.5 3.5 - 5.1 mEq/L    Chloride 104 98 - 107 mEq/L    CO2 19 (L) 21 - 32 mEq/L    Glucose 298 (H) 74 - 106 mg/dl    BUN 11 7 - 25 mg/dl    Creatinine 1.2 0.6 - 1.3 mg/dl    GFR est AA >60.0      GFR est non-AA 55      Calcium 9.0 8.5 - 10.1 mg/dl     AST (SGOT) 42 (H) 15 - 37 U/L    ALT (SGPT) 26 12 - 78 U/L    Alk. phosphatase 102 45 - 117 U/L    Bilirubin, total 0.9 0.2 - 1.0 mg/dl    Protein, total 8.2 6.4 - 8.2 gm/dl    Albumin 3.8 3.4 - 5.0 gm/dl    Anion gap 10 5 - 15 mmol/L   LIPASE    Collection Time: 02/24/18  7:15 PM   Result Value Ref Range    Lipase 68 (L) 73 - 716 U/L   METABOLIC PANEL, BASIC    Collection Time: 02/24/18  9:34 PM   Result Value Ref Range    Sodium 134 (L) 136 - 145 mEq/L    Potassium 3.7 3.5 - 5.1 mEq/L    Chloride 106 98 - 107 mEq/L    CO2 20 (L) 21 - 32 mEq/L    Glucose 271 (H) 74 - 106 mg/dl    BUN 10 7 - 25 mg/dl    Creatinine 0.9 0.6 - 1.3 mg/dl    GFR est AA >60.0      GFR est non-AA >60      Calcium 8.5 8.5 - 10.1 mg/dl    Anion gap 8 5 - 15 mmol/L   MAGNESIUM    Collection Time: 02/24/18  9:34 PM   Result Value Ref Range    Magnesium 1.7 1.6 - 2.6 mg/dl   GLUCOSE, POC    Collection Time: 02/24/18  9:38 PM   Result Value Ref Range    Glucose (POC) 272 (H) 65 - 105 mg/dL   POC BLOOD GAS + LACTIC ACID    Collection Time: 02/24/18  9:48 PM   Result Value Ref Range    pH 7.436 7.350 - 7.450      PCO2 29.4 (L) 35.0 - 45.0 mm Hg    PO2 116.0 (H) 75 - 100 mm Hg    BICARBONATE 19.8 18.0 - 26.0 mmol/L    O2 SAT 99.0 90 - 100 %    CO2, TOTAL 21.0 (L) 24 - 29 mmol/L    BASE EXCESS -4 (L) -2 - 3 mmol/L    Patient temp. 98.4 F      Sample type Art      SITE R Radial      DEVICE Room Air      ALLENS TEST Pass     GLUCOSE, POC    Collection Time: 02/24/18 10:57 PM   Result Value Ref Range    Glucose (POC) 195 (H) 65 - 105 mg/dL           Diagnostic:       Dragon medical dictation software was used for portions of this report.  Efforts have been made to edit the dictations, but occasionally words are mis-transcribed.    Knox Saliva, MD  02/25/2018  12:43 AM

## 2018-02-25 NOTE — Other (Signed)
Ordered IP-consult for diabetes educator complete.  Patient with multiple hospital ED visits and admissions for DM1 and gastroparesis complications.  Reviewed prior discharge summary from [facility] admitted for DKA earlier this month from 1/6-1/8 2020.  Pt states she sees Bindya Magoon MD, Endocrinologist for diabetes care. Pt reports she has an appt at the beginning of March.    Attempted to review pt's home regimen.  Pt answering some questions then "zoning out" for a few seconds and I had to repeatedly ask the question before I got an answer.  She apologized and stated she was very tired. Pt reports taking 28 units Basaglar (prescribed 26 at last discharge) and she uses a sliding scale for mealtimes typically taking 4 or 5 units before meals. She states her scale starts at a BG of 120.  She believes the 28 units of Basaglar is causing her frequent low BG. When asked if she lowered her Basaglar dose at all because of the low BG episodes she said no. She reports she has no Glucagon in her house for family to use.    Pt on Glucommander (GM) while admitted for BG control. Orders placed in GM this morning using sensitive/high target settings equalling a TDD of 17 units with 8 units recommended for tonight's Lantus dose. She has no long acting insulin circulating at this time. Pt may be better off being discharged with her last Lantus dose used here at [facility] then she can titrate up until in her desired target range based on fasting BG readings at home.  Pt wanting to eat her bkfst.  Pt pleasant but no very forthcoming with information. She has no questions for me at this time.

## 2018-02-25 NOTE — ED Notes (Signed)
TRANSFER - OUT REPORT:    Verbal report given to Char, RN on Dwan S Scherman  being transferred to 2W for routine progression of care       Report consisted of patient???s Situation, Background, Assessment and   Recommendations(SBAR).     Information from the following report(s) SBAR was reviewed with the receiving nurse.    Lines:   Peripheral IV 02/24/18 Right;Upper Arm (Active)   Site Assessment Clean, dry, & intact 02/24/2018  7:44 PM   Phlebitis Assessment 0 02/24/2018  7:44 PM   Infiltration Assessment 0 02/24/2018  7:44 PM   Dressing Status Clean, dry, & intact 02/24/2018  7:44 PM   Dressing Type Transparent 02/24/2018  7:44 PM   Hub Color/Line Status Pink 02/24/2018  7:44 PM   Alcohol Cap Used Yes 02/24/2018  7:44 PM       Peripheral IV 02/24/18 Left;Upper Cephalic (Active)   Site Assessment Clean, dry, & intact 02/24/2018  7:45 PM   Phlebitis Assessment 0 02/24/2018  7:45 PM   Infiltration Assessment 0 02/24/2018  7:45 PM   Dressing Status Clean, dry, & intact 02/24/2018  7:45 PM   Dressing Type Transparent;Tape 02/24/2018  7:45 PM   Hub Color/Line Status Flushed 02/24/2018  7:45 PM        Opportunity for questions and clarification was provided.      Patient transported with:   Registered Nurse

## 2018-02-25 NOTE — Progress Notes (Signed)
Problem: Falls - Risk of  Goal: *Absence of Falls  Description  Document Schmid Fall Risk and appropriate interventions in the flowsheet.  Outcome: Progressing Towards Goal  Note: Fall Risk Interventions:            Medication Interventions: Patient to call before getting OOB, Teach patient to arise slowly                   Problem: Patient Education: Go to Patient Education Activity  Goal: Patient/Family Education  Outcome: Progressing Towards Goal

## 2018-02-25 NOTE — Other (Signed)
Urine sample collected, sent to lab

## 2018-02-25 NOTE — Other (Signed)
TRANSFER - IN REPORT:    Verbal report received from Ta'Hesha, RN(name) on [name] [name]  being received from Emergency Department (unit) for routine progression of care      Report consisted of patient???s Situation, Background, Assessment and   Recommendations(SBAR).     Information from the following report(s) SBAR, Intake/Output, MAR and Recent Results was reviewed with the receiving nurse.    Opportunity for questions and clarification was provided.      Assessment completed upon patient???s arrival to unit and care assumed.     Tele Box Applied and verified. Sinus Tach HR 115-116 Pt is alert and oriented x4. Currently complaining of a headache and stomach pain. Will page hospitalist.

## 2018-02-25 NOTE — Other (Signed)
Pt c/o nausea, but refused prns available, stating that "they don't really work".  Pt not c/o any active abdominal pains, just tenderness, no PO intake other than some sips of juice.  No emesis noted today.      Pt appeared to be coming out of the bathroom when RN went in there.  RN asked if urine sample was available to collect, pt stated she did not pee yet.  RN reminded pt about sample, pt verbalized understanding

## 2018-02-26 LAB — CBC WITH AUTO DIFFERENTIAL
Basophils %: 0.4 % (ref 0–3)
Eosinophils %: 0.2 % (ref 0–5)
Hematocrit: 30.1 % — ABNORMAL LOW (ref 37.0–50.0)
Hemoglobin: 9.7 gm/dl — ABNORMAL LOW (ref 13.0–17.2)
Immature Granulocytes: 0.3 % (ref 0.0–3.0)
Lymphocytes %: 22.5 % — ABNORMAL LOW (ref 28–48)
MCH: 26.7 pg (ref 25.4–34.6)
MCHC: 32.2 gm/dl (ref 30.0–36.0)
MCV: 82.9 fL (ref 80.0–98.0)
MPV: 12 fL — ABNORMAL HIGH (ref 6.0–10.0)
Monocytes %: 4.3 % (ref 1–13)
Neutrophils %: 72.3 % — ABNORMAL HIGH (ref 34–64)
Nucleated RBCs: 0 (ref 0–0)
Platelets: 266 10*3/uL (ref 140–450)
RBC: 3.63 M/uL (ref 3.60–5.20)
RDW-SD: 45.1 (ref 36.4–46.3)
WBC: 12.3 10*3/uL — ABNORMAL HIGH (ref 4.0–11.0)

## 2018-02-26 LAB — RENAL FUNCTION PANEL
Albumin: 3.3 gm/dl — ABNORMAL LOW (ref 3.4–5.0)
Albumin: 3.3 gm/dl — ABNORMAL LOW (ref 3.4–5.0)
Anion Gap: 8 mmol/L (ref 5–15)
Anion gap: 8 mmol/L (ref 5–15)
BUN: 15 mg/dl (ref 7–25)
BUN: 15 mg/dl (ref 7–25)
CO2: 21 mEq/L (ref 21–32)
CO2: 21 mEq/L (ref 21–32)
Calcium: 8.1 mg/dl — ABNORMAL LOW (ref 8.5–10.1)
Calcium: 8.1 mg/dl — ABNORMAL LOW (ref 8.5–10.1)
Chloride: 111 mEq/L — ABNORMAL HIGH (ref 98–107)
Chloride: 111 mEq/L — ABNORMAL HIGH (ref 98–107)
Creatinine: 1.2 mg/dl (ref 0.6–1.3)
Creatinine: 1.2 mg/dl (ref 0.6–1.3)
EGFR IF NonAfrican American: 55
GFR African American: >60
GFR est AA: >60
GFR est non-AA: 55
Glucose: 123 mg/dl — ABNORMAL HIGH (ref 74–106)
Glucose: 123 mg/dl — ABNORMAL HIGH (ref 74–106)
Phosphorus: 2.3 mg/dl — ABNORMAL LOW (ref 2.5–4.9)
Phosphorus: 2.3 mg/dl — ABNORMAL LOW (ref 2.5–4.9)
Potassium: 3.5 mEq/L (ref 3.5–5.1)
Potassium: 3.5 mEq/L (ref 3.5–5.1)
Sodium: 139 mEq/L (ref 136–145)
Sodium: 139 mEq/L (ref 136–145)

## 2018-02-26 LAB — POCT GLUCOSE
POC Glucose: 248 mg/dL — ABNORMAL HIGH (ref 65–105)
POC Glucose: 98 mg/dL (ref 65–105)
POC Glucose: 179 mg/dL — ABNORMAL HIGH (ref 65–105)
POC Glucose: 152 mg/dL — ABNORMAL HIGH (ref 65–105)

## 2018-02-26 LAB — MAGNESIUM
Magnesium: 1.8 mg/dl (ref 1.6–2.6)
Magnesium: 1.8 mg/dl (ref 1.6–2.6)

## 2018-02-26 LAB — CBC WITH AUTOMATED DIFF
BASOPHILS: 0.4 % (ref 0–3)
EOSINOPHILS: 0.2 % (ref 0–5)
HCT: 30.1 % — ABNORMAL LOW (ref 37.0–50.0)
HGB: 9.7 gm/dl — ABNORMAL LOW (ref 13.0–17.2)
IMMATURE GRANULOCYTES: 0.3 % (ref 0.0–3.0)
LYMPHOCYTES: 22.5 % — ABNORMAL LOW (ref 28–48)
MCH: 26.7 pg (ref 25.4–34.6)
MCHC: 32.2 gm/dl (ref 30.0–36.0)
MCV: 82.9 fL (ref 80.0–98.0)
MONOCYTES: 4.3 % (ref 1–13)
MPV: 12 fL — ABNORMAL HIGH (ref 6.0–10.0)
NEUTROPHILS: 72.3 % — ABNORMAL HIGH (ref 34–64)
NRBC: 0 (ref 0–0)
PLATELET: 266 10*3/uL (ref 140–450)
RBC: 3.63 M/uL (ref 3.60–5.20)
RDW-SD: 45.1 (ref 36.4–46.3)
WBC: 12.3 10*3/uL — ABNORMAL HIGH (ref 4.0–11.0)

## 2018-02-26 LAB — GLUCOSE, POC
Glucose (POC): 248 mg/dL — ABNORMAL HIGH (ref 65–105)
Glucose (POC): 98 mg/dL (ref 65–105)
Glucose (POC): 179 mg/dL — ABNORMAL HIGH (ref 65–105)
Glucose (POC): 152 mg/dL — ABNORMAL HIGH (ref 65–105)

## 2018-02-26 MED ORDER — HYDROMORPHONE 1 MG/ML INJECTION SOLUTION
1 mg/mL | Freq: Once | INTRAMUSCULAR | Status: AC
Start: 2018-02-26 — End: 2018-02-25
  Administered 2018-02-26: 01:00:00 via INTRAVENOUS

## 2018-02-26 MED ORDER — HYDROMORPHONE 2 MG TAB
2 mg | Freq: Once | ORAL | Status: AC
Start: 2018-02-26 — End: 2018-02-26
  Administered 2018-02-27: 01:00:00 via ORAL

## 2018-02-26 MED ORDER — CLONIDINE 0.1 MG TAB
0.1 mg | Freq: Four times a day (QID) | ORAL | Status: DC | PRN
Start: 2018-02-26 — End: 2018-02-27
  Administered 2018-02-26 – 2018-02-27 (×2): via ORAL

## 2018-02-26 MED ORDER — LANSOPRAZOLE 30 MG RAPID DISSOLVE TAB, DELAYED RELEASE
30 mg | Freq: Every day | ORAL | Status: DC
Start: 2018-02-26 — End: 2018-02-27
  Administered 2018-02-27: 14:00:00 via ORAL

## 2018-02-26 MED ORDER — PROMETHAZINE IN NS 25 MG/50 ML IV PIGGY BAG
25 mg/50 ml | Freq: Four times a day (QID) | INTRAVENOUS | Status: DC | PRN
Start: 2018-02-26 — End: 2018-02-27

## 2018-02-26 MED ORDER — CLONIDINE 0.1 MG/24 HR WEEKLY TRANSDERM PATCH
0.1 mg/24 hr | TRANSDERMAL | Status: DC
Start: 2018-02-26 — End: 2018-02-27

## 2018-02-26 MED ORDER — HYDROMORPHONE 1 MG/ML INJECTION SOLUTION
1 mg/mL | Freq: Once | INTRAMUSCULAR | Status: AC
Start: 2018-02-26 — End: 2018-02-26
  Administered 2018-02-26: 14:00:00 via INTRAVENOUS

## 2018-02-26 MED ORDER — HYDROMORPHONE 2 MG TAB
2 mg | Freq: Three times a day (TID) | ORAL | Status: DC | PRN
Start: 2018-02-26 — End: 2018-02-27
  Administered 2018-02-27: 12:00:00 via ORAL

## 2018-02-26 MED ORDER — HYDRALAZINE 20 MG/ML IJ SOLN
20 mg/mL | Freq: Four times a day (QID) | INTRAMUSCULAR | Status: DC | PRN
Start: 2018-02-26 — End: 2018-02-27
  Administered 2018-02-26: 14:00:00 via INTRAVENOUS

## 2018-02-26 MED ORDER — HYDROCODONE-ACETAMINOPHEN 5 MG-325 MG TAB
5-325 mg | Freq: Once | ORAL | Status: AC
Start: 2018-02-26 — End: 2018-02-26
  Administered 2018-02-26: 11:00:00 via ORAL

## 2018-02-26 MED ORDER — CELECOXIB 200 MG CAP
200 mg | Freq: Every day | ORAL | Status: DC
Start: 2018-02-26 — End: 2018-02-27
  Administered 2018-02-26 – 2018-02-27 (×2): via ORAL

## 2018-02-26 MED ORDER — DIPHENHYDRAMINE 25 MG CAP
25 mg | Freq: Four times a day (QID) | ORAL | Status: DC | PRN
Start: 2018-02-26 — End: 2018-02-27

## 2018-02-26 MED ORDER — LANSOPRAZOLE 30 MG RAPID DISSOLVE TAB, DELAYED RELEASE
30 mg | Freq: Every day | ORAL | Status: DC
Start: 2018-02-26 — End: 2018-02-26

## 2018-02-26 MED FILL — BD POSIFLUSH NORMAL SALINE 0.9 % INJECTION SYRINGE: INTRAMUSCULAR | Qty: 10

## 2018-02-26 MED FILL — PROMETHAZINE IN NS 25 MG/50 ML IV PIGGY BAG: 25 mg/50 ml | INTRAVENOUS | Qty: 50

## 2018-02-26 MED FILL — HYDRALAZINE 20 MG/ML IJ SOLN: 20 mg/mL | INTRAMUSCULAR | Qty: 1

## 2018-02-26 MED FILL — PANTOPRAZOLE 40 MG IV SOLR: 40 mg | INTRAVENOUS | Qty: 40

## 2018-02-26 MED FILL — CLONIDINE 0.1 MG TAB: 0.1 mg | ORAL | Qty: 1

## 2018-02-26 MED FILL — LISINOPRIL 20 MG TAB: 20 mg | ORAL | Qty: 1

## 2018-02-26 MED FILL — HYDROCODONE-ACETAMINOPHEN 5 MG-325 MG TAB: 5-325 mg | ORAL | Qty: 1

## 2018-02-26 MED FILL — CLONIDINE 0.1 MG/24 HR WEEKLY TRANSDERM PATCH: 0.1 mg/24 hr | TRANSDERMAL | Qty: 1

## 2018-02-26 MED FILL — CELECOXIB 200 MG CAP: 200 mg | ORAL | Qty: 1

## 2018-02-26 MED FILL — HYDROMORPHONE 1 MG/ML INJECTION SOLUTION: 1 mg/mL | INTRAMUSCULAR | Qty: 1

## 2018-02-26 NOTE — Progress Notes (Signed)
Hospitalist Progress Note  Alvy Bimler, MD  [facility]    Daily Progress Note: 02/26/2018    Assessment/Plan:     Intractable nausea and vomiting due to cyclic vomiting syndrome and diabetic gastroparesis  Likely due to her diabetic gastroparesis.    Patient does have a positive test on urine drug screen for marijuana  Patient know that she has not had any marijuana in about 3 weeks  Patient has a gastric stimulator in place  We will continue Zofran PRN  D/c IVF to see how she does on her own    DM type I  Patient initially planned for on insulin drip but then transitioned to Glucomander SQ  Glucose levels controlled  Advance diet    DM gastroparesis   S/p gastric stimulator  Patient has Zofran  May consider Reglan on top of the stimulator  Patient says she can feel the stimulator shocking her sometimes    Abdominal pain  Noted it is located behind her gastric stimulator.  She tried Lidocaine patches, and gabapentin which did not help  Ibuprofen helps a little per pt.  Discussed adding Celebrex 200mg  daily LAnsoprazole (for GI prophylaxis with NSAID)  And Dilaudid 2mg  PO TID PRN    HTN  Resume lisinopril 20 mg daily  Added Clonidine patch as pt is sometimes unable to take PO    DVT prophylaxis  SCDs    CODE STATUS.  Full code.    Disposition.  Pt with very high BP and abd pain still an issue.  Meds adjusted    ------------------    36 minutes were spent on the patient of which more than 50% was spent in coordination of care and counseling (time spent with patient/family face to face, physical exam, reviewing laboratory and imaging investigations, speaking with physicians and nursing staff involved in this patient's care)    ------------------  Physical exam:  General:  Alert, NAD, pleasant.  Cooperative. Thin. Many Tattoos  HEENT: Sclera anicteric, PERRL, OM moist, throat clear.  Chest:  CTA B, no wheeze, no rales, no rhonchi.  Good air movement.  CV: RRR, S1/S2 were normal, no  murmur  Abdomen: NTND, soft, NABS, no masses were noted.  No rebound, no guarding.  Extremities: No edema.    Subjective:   Pt noted feeling better after Dilaudid (took it 9am and it was 5pm with only minimal pain with nop additional pain meds). We discussed DM gastroparesis and her gastric stimulator.  We discussed trying other meds to help and PO dilaudid      Objective:     Visit Vitals  BP 145/71 (BP 1 Location: Right arm, BP Patient Position: Supine)   Pulse 84   Temp 99 ??F (37.2 ??C)   Resp 18   Ht 5\' 6"  (1.676 m)   Wt 57.4 kg (126 lb 8.7 oz)   LMP 01/30/2018 (Approximate)   SpO2 100%   Breastfeeding No   BMI 20.42 kg/m??      O2 Device: Room air    Temp (24hrs), Avg:98.5 ??F (36.9 ??C), Min:98 ??F (36.7 ??C), Max:99 ??F (37.2 ??C)    No intake/output data recorded.   01/29 1901 - 01/31 0700  In: 6482.5 [P.O.:840; I.V.:5642.5]  Out: 1210 [Urine:1210]      Data Review:       Recent Days:  Recent Labs     02/26/18  0606 02/25/18  0531 02/24/18  1915   WBC 12.3* 23.4* 16.4*   HGB 9.7* 11.2* 11.3*  HCT 30.1* 34.9* 34.8*   PLT 266 246 267     Recent Labs     02/26/18  0606 02/25/18  1548 02/25/18  0902  02/24/18  1915   NA 139 136 138   < > 133*   K 3.5 3.7 3.8   < > 4.5   CL 111* 106 111*   < > 104   CO2 21 19* 16*   < > 19*   GLU 123* 205* 87   < > 298*   BUN 15 14 14    < > 11   CREA 1.2 1.1 1.1   < > 1.2   CA 8.1* 8.4* 8.1*   < > 9.0   MG 1.8 1.8 2.0   < >  --    PHOS 2.3*  --   --   --   --    ALB 3.3*  --   --   --  3.8   TBILI  --   --   --   --  0.9   SGOT  --   --   --   --  42*   ALT  --   --   --   --  26    < > = values in this interval not displayed.     Recent Labs     02/24/18  2148   PH 7.436   PCO2 29.4*   PO2 116.0*   HCO3 19.8       24 Hour Results:  Recent Results (from the past 24 hour(s))   GLUCOSE, POC    Collection Time: 02/25/18  9:22 PM   Result Value Ref Range    Glucose (POC) 248 (H) 65 - 105 mg/dL   RENAL FUNCTION PANEL    Collection Time: 02/26/18  6:06 AM   Result Value Ref Range    Sodium  139 136 - 145 mEq/L    Potassium 3.5 3.5 - 5.1 mEq/L    Chloride 111 (H) 98 - 107 mEq/L    CO2 21 21 - 32 mEq/L    Glucose 123 (H) 74 - 106 mg/dl    BUN 15 7 - 25 mg/dl    Creatinine 1.2 0.6 - 1.3 mg/dl    GFR est AA >16.1>60.0      GFR est non-AA 55      Calcium 8.1 (L) 8.5 - 10.1 mg/dl    Albumin 3.3 (L) 3.4 - 5.0 gm/dl    Phosphorus 2.3 (L) 2.5 - 4.9 mg/dl    Anion gap 8 5 - 15 mmol/L   MAGNESIUM    Collection Time: 02/26/18  6:06 AM   Result Value Ref Range    Magnesium 1.8 1.6 - 2.6 mg/dl   CBC WITH AUTOMATED DIFF    Collection Time: 02/26/18  6:06 AM   Result Value Ref Range    WBC 12.3 (H) 4.0 - 11.0 1000/mm3    RBC 3.63 3.60 - 5.20 M/uL    HGB 9.7 (L) 13.0 - 17.2 gm/dl    HCT 09.630.1 (L) 04.537.0 - 50.0 %    MCV 82.9 80.0 - 98.0 fL    MCH 26.7 25.4 - 34.6 pg    MCHC 32.2 30.0 - 36.0 gm/dl    PLATELET 409266 811140 - 914450 1000/mm3    MPV 12.0 (H) 6.0 - 10.0 fL    RDW-SD 45.1 36.4 - 46.3      NRBC 0 0 - 0  IMMATURE GRANULOCYTES 0.3 0.0 - 3.0 %    NEUTROPHILS 72.3 (H) 34 - 64 %    LYMPHOCYTES 22.5 (L) 28 - 48 %    MONOCYTES 4.3 1 - 13 %    EOSINOPHILS 0.2 0 - 5 %    BASOPHILS 0.4 0 - 3 %   GLUCOSE, POC    Collection Time: 02/26/18  8:04 AM   Result Value Ref Range    Glucose (POC) 98 65 - 105 mg/dL   GLUCOSE, POC    Collection Time: 02/26/18 11:59 AM   Result Value Ref Range    Glucose (POC) 179 (H) 65 - 105 mg/dL   GLUCOSE, POC    Collection Time: 02/26/18  4:37 PM   Result Value Ref Range    Glucose (POC) 152 (H) 65 - 105 mg/dL       No results found.    Microbiology:  All Micro Results     None           Cardiology:  Results for orders placed or performed during the hospital encounter of 04/20/16   EKG, 12 LEAD, INITIAL   Result Value Ref Range    Ventricular Rate 93 BPM    Atrial Rate 93 BPM    P-R Interval 114 ms    QRS Duration 82 ms    Q-T Interval 370 ms    QTC Calculation (Bezet) 460 ms    Calculated P Axis 75 degrees    Calculated R Axis 71 degrees    Calculated T Axis 58 degrees    Diagnosis       Normal sinus  rhythm    When compared with ECG of 05-May-2014 09:08,  Nonspecific T wave abnormality no longer evident in Inferior leads  Confirmed by Rozell Searing, M.D., Venkat ([id number]) on 04/21/2016 7:30:54 AM           Problem List:  Problem List as of 02/26/2018 Date Reviewed: 02/21/15          Codes Class Noted - Resolved    Intractable vomiting ICD-10-CM: R11.10  ICD-9-CM: 536.2  08/08/2017 - Present        Gastritis ICD-10-CM: K29.70  ICD-9-CM: 535.50  08/08/2017 - Present        Slow transit constipation ICD-10-CM: K59.01  ICD-9-CM: 564.01  08/08/2017 - Present        Marijuana use ICD-10-CM: F12.90  ICD-9-CM: 305.20  08/08/2017 - Present        Cyclical vomiting ICD-10-CM: R11.15  ICD-9-CM: 536.2  07/09/2017 - Present        Leukocytosis ICD-10-CM: D72.829  ICD-9-CM: 288.60  12/30/2016 - Present        Insulin long-term use (HCC) ICD-10-CM: Z79.4  ICD-9-CM: V58.67  11/20/2016 - Present        Essential hypertension ICD-10-CM: I10  ICD-9-CM: 401.9  03/27/2016 - Present    Overview Signed 08/08/2017  7:22 PM by Blima Rich, PA-C     Amlodipine, carvedilol, lisinopril.             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 Was supposed to follow-up with implanting gastroenterologist and [facility].             Intractable nausea and vomiting ICD-10-CM: R11.2  ICD-9-CM: 536.2  06/02/2014 - Present        Type 1 diabetes mellitus (HCC) ICD-10-CM: E10.9  ICD-9-CM: 250.01  02/17/2009 - Present    Overview Signed 08/08/2017  7:22 PM by Blima Rich, PA-C     Diagnosed at age 63. Had insulin pump at one time, but developed keloids at needle insertion sites. Was supposed to establish with [facility].             RESOLVED: Diabetic ketoacidosis (HCC) ICD-10-CM: E11.10  ICD-9-CM: 250.10  05/03/2014 - 07/09/2014        RESOLVED: DKA (diabetic ketoacidoses) (HCC) ICD-10-CM: E11.10  ICD-9-CM: 250.10  04/05/2014 - 07/09/2014              Medications reviewed  Current Facility-Administered Medications   Medication Dose Route Frequency   ??? diphenhydrAMINE (BENADRYL) capsule 25 mg  25 mg Oral Q6H PRN   ??? hydrALAZINE (APRESOLINE) 20 mg/mL injection 10 mg  10 mg IntraVENous Q6H PRN   ??? cloNIDine HCL (CATAPRES) tablet 0.1 mg  0.1 mg Oral Q6H PRN   ??? promethazine (PHENERGAN) 25 mg in NS IVPB  25 mg IntraVENous Q6H PRN   ??? ondansetron (ZOFRAN) injection 4 mg  4 mg IntraVENous Q4H PRN   ??? dextrose (D50) infusion 5-25 g  10-50 mL IntraVENous PRN   ??? glucagon (GLUCAGEN) injection 1 mg  1 mg IntraMUSCular PRN   ??? insulin glargine (LANTUS) injection 1-100 Units  1-100 Units SubCUTAneous QHS   ??? insulin lispro (HUMALOG) injection   SubCUTAneous AC&HS   ??? insulin lispro (HUMALOG) injection 1-100 Units  1-100 Units SubCUTAneous PRN   ??? sodium chloride (NS) flush 5-10 mL  5-10 mL IntraVENous Q8H   ??? sodium chloride (NS) flush 5-10 mL  5-10 mL IntraVENous PRN   ??? naloxone (NARCAN) injection 0.1 mg  0.1 mg IntraVENous PRN   ??? enoxaparin (LOVENOX) injection 40 mg  40 mg SubCUTAneous Q24H   ??? pantoprazole (PROTONIX) 40 mg in 0.9% sodium chloride 10 mL injection  40 mg IntraVENous Q12H   ??? 0.9% sodium chloride infusion  100 mL/hr IntraVENous CONTINUOUS   ??? lisinopril (PRINIVIL, ZESTRIL) tablet 20 mg  20 mg Oral DAILY         Alvy Bimler, MD

## 2018-02-26 NOTE — Progress Notes (Signed)
Problem: Falls - Risk of  Goal: *Absence of Falls  Description  Document Schmid Fall Risk and appropriate interventions in the flowsheet.  Outcome: Progressing Towards Goal  Note: Fall Risk Interventions:            Medication Interventions: Patient to call before getting OOB, Teach patient to arise slowly

## 2018-02-26 NOTE — Progress Notes (Signed)
PAGER ID: [phone]   MESSAGE: [phone] [name] [name] Dx. Intractable vomiting (Attending Maryelizabeth Kaufmann, Judie Petit.) Pt is requesting something for pain for menstrual cramping. Char Ext. [phone]    @ [id number] Received callback from Dr. Langston Masker. Telephone order for 1x dose of 1 tab Norco 5-325mg  PO.     Will cont to monitor.

## 2018-02-26 NOTE — Progress Notes (Signed)
 Current discharge plan is home with family assistance.  Patient chose [facility] if home health needed.  Continue to monitor.      Patient admitted on 02/24/2018 from home with   Chief Complaint   Patient presents with   . Abdominal Pain   . Vomiting   . Nausea          The patient is being treated for    PMH:   Past Medical History:   Diagnosis Date   . Cyclical vomiting 07/09/2017   . Diabetes (HCC)    . Diabetic ketoacidosis without coma associated with type 1 diabetes mellitus (HCC) 11/05/2015   . Drug-seeking behavior    . Essential hypertension 03/27/2016    Amlodipine, carvedilol, lisinopril.   . Gastroparesis    . Gastroparesis    . Hypertension    . Insulin  long-term use (HCC) 11/20/2016   . Slow transit constipation 08/08/2017   . Type 1 diabetes mellitus (HCC) 02/17/2009    Diagnosed at age 83. Had insulin  pump at one time, but developed keloids at needle insertion sites. Was supposed to establish with [facility].        Treatment Team: Treatment Team: Attending Provider: Lorriane Oliva BRAVO, MD; Consulting Provider: Dannielle Curtistine LABOR, MD; Hospitalist: Lorriane Oliva BRAVO, MD; Care Manager: Eric Breck DEL, RN      The patient has been admitted to the hospital 1 times in the past 12 months.    Previous 4 Admission Dates Admission and Discharge Diagnosis Interventions Barriers Disposition   08/08/2017 - 08/09/2017 Intractable N/V, HTN, marijuana use   Home with no needs                          Patient and Family/Caregivers Goals of Care:   Home with family assistance    Caregivers Participating in Plan of Care/Discharge Plan with the patient:   Yes - mother - Kaley Jutras    Tentative dc plan:   Home with family assistance    Anticipated DME needs for discharge:   None    Does the patient have appropriate clothing available to be worn at discharge?   yes      The patient and care participants are willing to travel N/A area for discharge facility.  The patient and plan of care participants have been provided  with a list of all available Rehab Facilities or Home Health agencies as applicable. CM will follow up with a list of facilities or agencies that are offer acceptance.    CM has disclosed any financial interest that [facility] may have with any facility or agency.    Anticipated Discharge Date:   02/27/2018    The plan of care and discharge plan has been discussed with Lorriane Oliva BRAVO, MD and all other appropriate providers and adjusted per interdisciplinary team recommendations and in discussion with the patient and the patient designated Care Plan Participants.    Barriers to Healthcare Success/ Readmission Risk Factors:   none    Consults:  Palliative Care Consult Recommended:   no  Transitional Care Clinic Referral:   no  Transitional Nurse Navigator Referral:   no  Oncology Navigator Referral:   no  SW consulted:   yes  [facility] Consulted:   N/A  Outside Hospital/Community Resources Referrals and Collaboration:   None    Food/Nutrition Needs:   yes  Dietician Consulted:   yes    RRAT Score: Low Risk            12       Total Score        3 Has Seen PCP in Last 6 Months (Yes=3, No=0)    4 IP Visits Last 12 Months (1-3=4, 4=9, >4=11)    5 Pt. Coverage (Medicare=5 , Medicaid, or Self-Pay=4)        Criteria that do not apply:    Married. Living with Significant Other. Assisted Living. LTAC. SNF. or   Rehab    Patient Length of Stay (>5 days = 3)    Charlson Comorbidity Score (Age + Comorbid Conditions)           PCP: Rosi Josette DEL, MD . How do you get to your doctor appointments?    Specialists:   None    Dialysis Unit:   N/A    [facility] [address]. Are there any medications that you have trouble paying for? Any difficulty getting your medications?  None    DME available at Home:  None    Home O2 L Flow:  none            Home O2 Provider: N/A    Home Environment and Prior Level of Function: Lives at [address]  [place]   [address] @HOMEPHONE @. Lives  with mother and dependent child in a multistory home.  There are no steps outside the home.    Responsibilities at home include   ADLs and child care    Prior to admission open services:   None    Home Health Agency-  N/A  Personal Care Agency-  N/A    Extended Emergency Contact Information  Primary Emergency Contact: Haymond,Veronica D  Address: [address]           [place], [place] [address] [place]  Home Phone: [phone]  Relation: Mother     Transportation: family will transport home    Therapy Recommendations:    OT =   N/A    PT = N/A    SLP =  N/A     RT Home O2 Evaluation =  N/A    Wound Care =  N/A    Case Management Assessment    ABUSE/NEGLECT SCREENING   Physical Abuse/Neglect: Denies   Sexual Abuse: Denies   Sexual Abuse: Denies   Other Abuse/Issues: Denies          PRIMARY DECISION MAKER                                   CARE MANAGEMENT INTERVENTIONS   Readmission Interview Completed: Not Applicable   PCP Verified by CM: Yes(Dr. Kristen Jalbert)           Mode of Transport at Discharge: Other (see comment)(family will pick up patient for discharge)       Transition of Care Consult (CM Consult): Other(patient chose [facility] if [facility] needed)               Discharge Durable Medical Equipment: Yes               Current Support Network: Other(lives with mother and dependent child)   Reason for Referral: DCP Rounds   History Provided By: Patient   Patient Orientation: Sedated       Support System Response: Unavailable  Previous Living Arrangement: Lives with Family Independent   Home Accessibility: Multi Level Home, Steps(no steps outside - 1/2 bath on the first floor)   Prior Functional Level: Independent in ADLs/IADLs   Current Functional Level: Independent in ADLs/IADLs       Can patient return to prior living arrangement: Yes   Ability to make needs known:: Good   Family able to assist with home care needs:: Yes   Pets: Dog   Needs help with pets while hospitalized: No                Confirm Follow Up Transport: Self   Confirm Transport and Arrange: No              DISCHARGE LOCATION   Discharge Placement: Home with family assistance

## 2018-02-26 NOTE — Progress Notes (Signed)
 Pt was given dose of Norco @ 0531. After giving patient pain med pt states I wish I could have something that works faster than this because with the cramping and other pain I cannot take this any longer.     Pt was able to tolerate eating a [place] sandwich last night with no complaint of nausea or vomiting.     It is now 0608 and pt calls to state that she has vomit the PO pain med that was given to her. Pt understands that with her condition that she is unable to tolerate certain medications. Pt does not want just tylenol  she requests something that is stronger and works faster.    Stated to pt that she can have a dose of phenergan for nausea, pt agreed.    Will cont to monitor.

## 2018-02-26 NOTE — Progress Notes (Addendum)
PAGER ID: [phone]   MESSAGE: [phone], Kritika Dx. Intractable vomiting (Attending Munoz, M.) Pt is requesting something for pain for menstrual cramping. Char Ext. [phone]    @ 0509 Received callback from Dr. Morris. Telephone order for 1x dose of 1 tab Norco 5-325mg PO.     Will cont to monitor.

## 2018-02-26 NOTE — Progress Notes (Signed)
Pt was given dose of Norco @ 0531. After giving patient pain med pt states "I wish I could have something that works faster than this because with the cramping and other pain I cannot take this any longer."     Pt was able to tolerate eating a turkey sandwich last night with no complaint of nausea or vomiting.     It is now 0608 and pt calls to state that she has vomit the PO pain med that was given to her. Pt understands that with her condition that she is unable to tolerate certain medications. Pt does not want "just tylenol" she requests "something that is stronger and works faster."    Stated to pt that she can have a dose of phenergan for nausea, pt agreed.    Will cont to monitor.

## 2018-02-26 NOTE — Other (Signed)
Bedside and Verbal shift change report given to Levell July, RN   (oncoming nurse) by Kathlene November, RN (offgoing nurse). Report included the following information SBAR, Kardex and MAR Tele dc'd .

## 2018-02-26 NOTE — Other (Signed)
BP 214/96   Dr. Maryelizabeth Kaufmann notified. Will place orders.

## 2018-02-26 NOTE — Progress Notes (Signed)
Current discharge plan is home with family assistance.  Patient chose [facility] if home health needed.  Continue to monitor.      Patient admitted on 02/24/2018 from home with   Chief Complaint   Patient presents with   ??? Abdominal Pain   ??? Vomiting   ??? Nausea          The patient is being treated for    PMH:   Past Medical History:   Diagnosis Date   ??? Cyclical vomiting 07/09/2017   ??? Diabetes (HCC)    ??? Diabetic ketoacidosis without coma associated with type 1 diabetes mellitus (HCC) 11/05/2015   ??? Drug-seeking behavior    ??? Essential hypertension 03/27/2016    Amlodipine, carvedilol, lisinopril.   ??? Gastroparesis    ??? Gastroparesis    ??? Hypertension    ??? Insulin long-term use (HCC) 11/20/2016   ??? Slow transit constipation 08/08/2017   ??? Type 1 diabetes mellitus (HCC) 02/17/2009    Diagnosed at age 84. Had insulin pump at one time, but developed keloids at needle insertion sites. Was supposed to establish with [facility].        Treatment Team: Treatment Team: Attending Provider: Alvy Bimler, MD; Consulting Provider: Otilio Carpen, MD; Hospitalist: Alvy Bimler, MD; Care Manager: Salome Spotted, RN      The patient has been admitted to the hospital 1 times in the past 12 months.    Previous 4 Admission Dates Admission and Discharge Diagnosis Interventions Barriers Disposition   08/08/2017 - 08/09/2017 Intractable N/V, HTN, marijuana use   Home with no needs                          Patient and Family/Caregivers Goals of Care:   Home with family assistance    Caregivers Participating in Plan of Care/Discharge Plan with the patient:   Yes - mother - Jovanna Jarrin    Tentative dc plan:   Home with family assistance    Anticipated DME needs for discharge:   None    Does the patient have appropriate clothing available to be worn at discharge?   yes      The patient and care participants are willing to travel N/A area for discharge facility.   The patient and plan of care participants have been provided with a list of all available Rehab Facilities or Home Health agencies as applicable. CM will follow up with a list of facilities or agencies that are offer acceptance.    CM has disclosed any financial interest that [facility] may have with any facility or agency.    Anticipated Discharge Date:   02/27/2018    The plan of care and discharge plan has been discussed with Alvy Bimler, MD and all other appropriate providers and adjusted per interdisciplinary team recommendations and in discussion with the patient and the patient designated Care Plan Participants.    Barriers to Healthcare Success/ Readmission Risk Factors:   none    Consults:  Palliative Care Consult Recommended:   no  Transitional Care Clinic Referral:   no  Transitional Nurse Navigator Referral:   no  Oncology Navigator Referral:   no  SW consulted:   yes  [facility] Consulted:   N/A  Outside Hospital/Community Resources Referrals and Collaboration:   None    Food/Nutrition Needs:   yes  Dietician Consulted:   yes    RRAT Score: Low Risk            12       Total Score        3 Has Seen PCP in Last 6 Months (Yes=3, No=0)    4 IP Visits Last 12 Months (1-3=4, 4=9, >4=11)    5 Pt. Coverage (Medicare=5 , Medicaid, or Self-Pay=4)        Criteria that do not apply:    Married. Living with Significant Other. Assisted Living. LTAC. SNF. or   Rehab    Patient Length of Stay (>5 days = 3)    Charlson Comorbidity Score (Age + Comorbid Conditions)           PCP: Lyndon CodeJalbert, Kristen H, MD . How do you get to your doctor appointments?    Specialists:   None    Dialysis Unit:   N/A    [facility] [address]. Are there any medications that you have trouble paying for? Any difficulty getting your medications?  None    DME available at Home:  None    Home O2 L Flow:  none            Home O2 Provider: N/A     Home Environment and Prior Level of Function: [address] [place] [address]  [place] [id number] @HOMEPHONE @. Lives with mother and dependent child in a multistory home.  There are no steps outside the home.    Responsibilities at home include   ADLs and child care    Prior to admission open services:   None    Home Health Agency-  N/A  Personal Care Agency-  N/A    Extended Emergency Contact Information  Primary Emergency Contact: Gallaher,Veronica D  Address: [address]           [place], [place] [id number] [place]  Home Phone: [phone]  Relation: Mother     Transportation: family will transport home    Therapy Recommendations:    OT =   N/A    PT = N/A    SLP =  N/A     RT Home O2 Evaluation =  N/A    Wound Care =  N/A    Case Management Assessment    ABUSE/NEGLECT SCREENING   Physical Abuse/Neglect: Denies   Sexual Abuse: Denies   Sexual Abuse: Denies   Other Abuse/Issues: Denies          PRIMARY DECISION MAKER                                   CARE MANAGEMENT INTERVENTIONS   Readmission Interview Completed: Not Applicable   PCP Verified by CM: Yes(Dr. Liz MaladyKristen Jalbert)           Mode of Transport at Discharge: Other (see comment)(family will pick up patient for discharge)       Transition of Care Consult (CM Consult): Other([facility]               Discharge Durable Medical Equipment: Yes               Current Support Network: Other(lives with mother and dependent child)   Reason for Referral: DCP Rounds   History Provided By: Patient   Patient Orientation: Sedated       Support System Response: Unavailable  Previous Living Arrangement: Lives with Family Independent   Home Accessibility: Multi Level Home, Steps(no steps outside - 1/2 bath on the first floor)   Prior Functional Level: Independent in ADLs/IADLs   Current Functional Level: Independent in ADLs/IADLs       Can patient return to prior living arrangement: Yes   Ability to make needs known:: Good    Family able to assist with home care needs:: Yes   Pets: Dog   Needs help with pets while hospitalized: No               Confirm Follow Up Transport: Self   Confirm Transport and Arrange: No              DISCHARGE LOCATION   Discharge Placement: Home with family assistance

## 2018-02-26 NOTE — Progress Notes (Signed)
Hospitalist Progress Note  Alvy Bimler, MD  [facility] Hospitalists    Daily Progress Note: 02/26/2018    Assessment/Plan:     Intractable nausea and vomiting due to cyclic vomiting syndrome and diabetic gastroparesis  Likely due to her diabetic gastroparesis.    Patient does have a positive test on urine drug screen for marijuana  Patient know that she has not had any marijuana in about 3 weeks  Patient has a gastric stimulator in place  We will continue Zofran PRN  D/c IVF to see how she does on her own    DM type I  Patient initially planned for on insulin drip but then transitioned to Glucomander SQ  Glucose levels controlled  Advance diet    DM gastroparesis   S/p gastric stimulator  Patient has Zofran  May consider Reglan on top of the stimulator  Patient says she can feel the stimulator shocking her sometimes    Abdominal pain  Noted it is located behind her gastric stimulator.  She tried Lidocaine patches, and gabapentin which did not help  Ibuprofen helps a little per pt.  Discussed adding Celebrex 200mg  daily LAnsoprazole (for GI prophylaxis with NSAID)  And Dilaudid 2mg  PO TID PRN    HTN  Resume lisinopril 20 mg daily  Added Clonidine patch as pt is sometimes unable to take PO    DVT prophylaxis  SCDs    CODE STATUS.  Full code.    Disposition.  Pt with very high BP and abd pain still an issue.  Meds adjusted    ------------------    36 minutes were spent on the patient of which more than 50% was spent in coordination of care and counseling (time spent with patient/family face to face, physical exam, reviewing laboratory and imaging investigations, speaking with physicians and nursing staff involved in this patient's care)    ------------------  Physical exam:  General:  Alert, NAD, pleasant.  Cooperative. Thin. Many Tattoos  HEENT: Sclera anicteric, PERRL, OM moist, throat clear.  Chest:  CTA B, no wheeze, no rales, no rhonchi.  Good air movement.  CV: RRR, S1/S2 were normal, no murmur   Abdomen: NTND, soft, NABS, no masses were noted.  No rebound, no guarding.  Extremities: No edema.    Subjective:   Pt noted feeling better after Dilaudid (took it 9am and it was 5pm with only minimal pain with nop additional pain meds). We discussed DM gastroparesis and her gastric stimulator.  We discussed trying other meds to help and PO dilaudid      Objective:     Visit Vitals  BP 145/71 (BP 1 Location: Right arm, BP Patient Position: Supine)   Pulse 84   Temp 99 ??F (37.2 ??C)   Resp 18   Ht 5\' 6"  (1.676 m)   Wt 57.4 kg (126 lb 8.7 oz)   LMP 01/30/2018 (Approximate)   SpO2 100%   Breastfeeding No   BMI 20.42 kg/m??      O2 Device: Room air    Temp (24hrs), Avg:98.5 ??F (36.9 ??C), Min:98 ??F (36.7 ??C), Max:99 ??F (37.2 ??C)    No intake/output data recorded.   01/29 1901 - 01/31 0700  In: 6482.5 [P.O.:840; I.V.:5642.5]  Out: 1210 [Urine:1210]      Data Review:       Recent Days:  Recent Labs     02/26/18  0606 02/25/18  0531 02/24/18  1915   WBC 12.3* 23.4* 16.4*   HGB 9.7* 11.2* 11.3*  HCT 30.1* 34.9* 34.8*   PLT 266 246 267     Recent Labs     02/26/18  0606 02/25/18  1548 02/25/18  0902  02/24/18  1915   NA 139 136 138   < > 133*   K 3.5 3.7 3.8   < > 4.5   CL 111* 106 111*   < > 104   CO2 21 19* 16*   < > 19*   GLU 123* 205* 87   < > 298*   BUN 15 14 14    < > 11   CREA 1.2 1.1 1.1   < > 1.2   CA 8.1* 8.4* 8.1*   < > 9.0   MG 1.8 1.8 2.0   < >  --    PHOS 2.3*  --   --   --   --    ALB 3.3*  --   --   --  3.8   TBILI  --   --   --   --  0.9   SGOT  --   --   --   --  42*   ALT  --   --   --   --  26    < > = values in this interval not displayed.     Recent Labs     02/24/18  2148   PH 7.436   PCO2 29.4*   PO2 116.0*   HCO3 19.8       24 Hour Results:  Recent Results (from the past 24 hour(s))   GLUCOSE, POC    Collection Time: 02/25/18  9:22 PM   Result Value Ref Range    Glucose (POC) 248 (H) 65 - 105 mg/dL   RENAL FUNCTION PANEL    Collection Time: 02/26/18  6:06 AM   Result Value Ref Range     Sodium 139 136 - 145 mEq/L    Potassium 3.5 3.5 - 5.1 mEq/L    Chloride 111 (H) 98 - 107 mEq/L    CO2 21 21 - 32 mEq/L    Glucose 123 (H) 74 - 106 mg/dl    BUN 15 7 - 25 mg/dl    Creatinine 1.2 0.6 - 1.3 mg/dl    GFR est AA >16.1      GFR est non-AA 55      Calcium 8.1 (L) 8.5 - 10.1 mg/dl    Albumin 3.3 (L) 3.4 - 5.0 gm/dl    Phosphorus 2.3 (L) 2.5 - 4.9 mg/dl    Anion gap 8 5 - 15 mmol/L   MAGNESIUM    Collection Time: 02/26/18  6:06 AM   Result Value Ref Range    Magnesium 1.8 1.6 - 2.6 mg/dl   CBC WITH AUTOMATED DIFF    Collection Time: 02/26/18  6:06 AM   Result Value Ref Range    WBC 12.3 (H) 4.0 - 11.0 1000/mm3    RBC 3.63 3.60 - 5.20 M/uL    HGB 9.7 (L) 13.0 - 17.2 gm/dl    HCT 09.6 (L) 04.5 - 50.0 %    MCV 82.9 80.0 - 98.0 fL    MCH 26.7 25.4 - 34.6 pg    MCHC 32.2 30.0 - 36.0 gm/dl    PLATELET 409 811 - 914 1000/mm3    MPV 12.0 (H) 6.0 - 10.0 fL    RDW-SD 45.1 36.4 - 46.3      NRBC 0 0 - 0  IMMATURE GRANULOCYTES 0.3 0.0 - 3.0 %    NEUTROPHILS 72.3 (H) 34 - 64 %    LYMPHOCYTES 22.5 (L) 28 - 48 %    MONOCYTES 4.3 1 - 13 %    EOSINOPHILS 0.2 0 - 5 %    BASOPHILS 0.4 0 - 3 %   GLUCOSE, POC    Collection Time: 02/26/18  8:04 AM   Result Value Ref Range    Glucose (POC) 98 65 - 105 mg/dL   GLUCOSE, POC    Collection Time: 02/26/18 11:59 AM   Result Value Ref Range    Glucose (POC) 179 (H) 65 - 105 mg/dL   GLUCOSE, POC    Collection Time: 02/26/18  4:37 PM   Result Value Ref Range    Glucose (POC) 152 (H) 65 - 105 mg/dL       No results found.    Microbiology:  All Micro Results     None           Cardiology:  Results for orders placed or performed during the hospital encounter of 04/20/16   EKG, 12 LEAD, INITIAL   Result Value Ref Range    Ventricular Rate 93 BPM    Atrial Rate 93 BPM    P-R Interval 114 ms    QRS Duration 82 ms    Q-T Interval 370 ms    QTC Calculation (Bezet) 460 ms    Calculated P Axis 75 degrees    Calculated R Axis 71 degrees    Calculated T Axis 58 degrees    Diagnosis        Normal sinus rhythm    When compared with ECG of 05-May-2014 09:08,  Nonspecific T wave abnormality no longer evident in Inferior leads  Confirmed by Rozell SearingIyer, M.D., Venkat ([id number]) on 04/21/2016 7:30:54 AM           Problem List:  Problem List as of 02/26/2018 Date Reviewed: 02/07/2015          Codes Class Noted - Resolved    Intractable vomiting ICD-10-CM: R11.10  ICD-9-CM: 536.2  08/08/2017 - Present        Gastritis ICD-10-CM: K29.70  ICD-9-CM: 535.50  08/08/2017 - Present        Slow transit constipation ICD-10-CM: K59.01  ICD-9-CM: 564.01  08/08/2017 - Present        Marijuana use ICD-10-CM: F12.90  ICD-9-CM: 305.20  08/08/2017 - Present        Cyclical vomiting ICD-10-CM: R11.15  ICD-9-CM: 536.2  07/09/2017 - Present        Leukocytosis ICD-10-CM: D72.829  ICD-9-CM: 288.60  12/30/2016 - Present        Insulin long-term use (HCC) ICD-10-CM: Z79.4  ICD-9-CM: V58.67  11/20/2016 - Present        Essential hypertension ICD-10-CM: I10  ICD-9-CM: 401.9  03/27/2016 - Present    Overview Signed 08/08/2017  7:22 PM by Blima RichHarter, Sarah C, PA-C     Amlodipine, carvedilol, lisinopril.             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 Was supposed to follow-up with implanting gastroenterologist and [facility].             Intractable nausea and vomiting ICD-10-CM: R11.2  ICD-9-CM: 536.2  06/02/2014 - Present        Type 1 diabetes mellitus (HCC) ICD-10-CM: E10.9  ICD-9-CM: 250.01  02/17/2009 - Present     Overview Signed 08/08/2017  7:22 PM by Blima Rich, PA-C     Diagnosed at age 79. Had insulin pump at one time, but developed keloids at needle insertion sites. Was supposed to establish with [facility].             RESOLVED: Diabetic ketoacidosis (HCC) ICD-10-CM: E11.10  ICD-9-CM: 250.10  05/03/2014 - 07/09/2014        RESOLVED: DKA (diabetic ketoacidoses) (HCC) ICD-10-CM: E11.10  ICD-9-CM: 250.10  04/05/2014 - 07/09/2014              Medications reviewed  Current Facility-Administered Medications   Medication Dose Route Frequency   ??? diphenhydrAMINE (BENADRYL) capsule 25 mg  25 mg Oral Q6H PRN   ??? hydrALAZINE (APRESOLINE) 20 mg/mL injection 10 mg  10 mg IntraVENous Q6H PRN   ??? cloNIDine HCL (CATAPRES) tablet 0.1 mg  0.1 mg Oral Q6H PRN   ??? promethazine (PHENERGAN) 25 mg in NS IVPB  25 mg IntraVENous Q6H PRN   ??? ondansetron (ZOFRAN) injection 4 mg  4 mg IntraVENous Q4H PRN   ??? dextrose (D50) infusion 5-25 g  10-50 mL IntraVENous PRN   ??? glucagon (GLUCAGEN) injection 1 mg  1 mg IntraMUSCular PRN   ??? insulin glargine (LANTUS) injection 1-100 Units  1-100 Units SubCUTAneous QHS   ??? insulin lispro (HUMALOG) injection   SubCUTAneous AC&HS   ??? insulin lispro (HUMALOG) injection 1-100 Units  1-100 Units SubCUTAneous PRN   ??? sodium chloride (NS) flush 5-10 mL  5-10 mL IntraVENous Q8H   ??? sodium chloride (NS) flush 5-10 mL  5-10 mL IntraVENous PRN   ??? naloxone (NARCAN) injection 0.1 mg  0.1 mg IntraVENous PRN   ??? enoxaparin (LOVENOX) injection 40 mg  40 mg SubCUTAneous Q24H   ??? pantoprazole (PROTONIX) 40 mg in 0.9% sodium chloride 10 mL injection  40 mg IntraVENous Q12H   ??? 0.9% sodium chloride infusion  100 mL/hr IntraVENous CONTINUOUS   ??? lisinopril (PRINIVIL, ZESTRIL) tablet 20 mg  20 mg Oral DAILY         Alvy Bimler, MD

## 2018-02-27 LAB — RENAL FUNCTION PANEL
Albumin: 3.2 gm/dl — ABNORMAL LOW (ref 3.4–5.0)
Albumin: 3.2 gm/dl — ABNORMAL LOW (ref 3.4–5.0)
Anion Gap: 9 mmol/L (ref 5–15)
Anion gap: 9 mmol/L (ref 5–15)
BUN: 13 mg/dl (ref 7–25)
BUN: 13 mg/dl (ref 7–25)
CO2: 21 mEq/L (ref 21–32)
CO2: 21 mEq/L (ref 21–32)
Calcium: 8.5 mg/dl (ref 8.5–10.1)
Calcium: 8.5 mg/dl (ref 8.5–10.1)
Chloride: 103 mEq/L (ref 98–107)
Chloride: 103 mEq/L (ref 98–107)
Creatinine: 1.1 mg/dl (ref 0.6–1.3)
Creatinine: 1.1 mg/dl (ref 0.6–1.3)
EGFR IF NonAfrican American: >60
GFR African American: >60
GFR est AA: >60
GFR est non-AA: >60
Glucose: 191 mg/dl — ABNORMAL HIGH (ref 74–106)
Glucose: 191 mg/dl — ABNORMAL HIGH (ref 74–106)
Phosphorus: 2 mg/dl — ABNORMAL LOW (ref 2.5–4.9)
Phosphorus: 2 mg/dl — ABNORMAL LOW (ref 2.5–4.9)
Potassium: 3.3 mEq/L — ABNORMAL LOW (ref 3.5–5.1)
Potassium: 3.3 mEq/L — ABNORMAL LOW (ref 3.5–5.1)
Sodium: 133 mEq/L — ABNORMAL LOW (ref 136–145)
Sodium: 133 mEq/L — ABNORMAL LOW (ref 136–145)

## 2018-02-27 LAB — POCT GLUCOSE
POC Glucose: 230 mg/dL — ABNORMAL HIGH (ref 65–105)
POC Glucose: 263 mg/dL — ABNORMAL HIGH (ref 65–105)

## 2018-02-27 LAB — MAGNESIUM
Magnesium: 2.3 mg/dl (ref 1.6–2.6)
Magnesium: 2.3 mg/dl (ref 1.6–2.6)

## 2018-02-27 LAB — GLUCOSE, POC
Glucose (POC): 230 mg/dL — ABNORMAL HIGH (ref 65–105)
Glucose (POC): 263 mg/dL — ABNORMAL HIGH (ref 65–105)

## 2018-02-27 MED ORDER — AMITRIPTYLINE 25 MG TAB
25 mg | ORAL_TABLET | Freq: Every evening | ORAL | 0 refills | Status: AC
Start: 2018-02-27 — End: ?

## 2018-02-27 MED ORDER — PROMETHAZINE 12.5 MG TAB
12.5 mg | ORAL_TABLET | Freq: Four times a day (QID) | ORAL | 0 refills | Status: AC | PRN
Start: 2018-02-27 — End: ?

## 2018-02-27 MED ORDER — AMITRIPTYLINE 25 MG TAB
25 mg | Freq: Every evening | ORAL | Status: DC
Start: 2018-02-27 — End: 2018-02-27

## 2018-02-27 MED ORDER — CLONIDINE 0.1 MG/24 HR WEEKLY TRANSDERM PATCH
0.1 mg/24 hr | MEDICATED_PATCH | TRANSDERMAL | 0 refills | Status: AC
Start: 2018-02-27 — End: ?

## 2018-02-27 MED ORDER — ACETAMINOPHEN 325 MG TABLET
325 mg | Freq: Four times a day (QID) | ORAL | Status: DC | PRN
Start: 2018-02-27 — End: 2018-02-27
  Administered 2018-02-27: 06:00:00 via ORAL

## 2018-02-27 MED ORDER — MAGNESIUM CITRATE ORAL SOLN
Freq: Every day | ORAL | Status: DC | PRN
Start: 2018-02-27 — End: 2018-02-27
  Administered 2018-02-27: 15:00:00 via ORAL

## 2018-02-27 MED ORDER — LISINOPRIL 20 MG TAB
20 mg | ORAL_TABLET | Freq: Every day | ORAL | 0 refills | Status: AC
Start: 2018-02-27 — End: ?

## 2018-02-27 MED ORDER — PROMETHAZINE 12.5 MG RECTAL SUPPOSITORY
12.5 mg | Freq: Four times a day (QID) | RECTAL | 0 refills | Status: AC | PRN
Start: 2018-02-27 — End: 2018-03-06

## 2018-02-27 MED ORDER — LANSOPRAZOLE 30 MG RAPID DISSOLVE TAB, DELAYED RELEASE
30 mg | ORAL_TABLET | Freq: Every day | ORAL | 0 refills | Status: AC
Start: 2018-02-27 — End: ?

## 2018-02-27 MED FILL — MAGNESIUM CITRATE ORAL SOLN: ORAL | Qty: 296

## 2018-02-27 MED FILL — BD POSIFLUSH NORMAL SALINE 0.9 % INJECTION SYRINGE: INTRAMUSCULAR | Qty: 10

## 2018-02-27 MED FILL — LISINOPRIL 20 MG TAB: 20 mg | ORAL | Qty: 1

## 2018-02-27 MED FILL — HYDROMORPHONE 2 MG TAB: 2 mg | ORAL | Qty: 1

## 2018-02-27 MED FILL — ENOXAPARIN 40 MG/0.4 ML SUB-Q SYRINGE: 40 mg/0.4 mL | SUBCUTANEOUS | Qty: 0.4

## 2018-02-27 MED FILL — CLONIDINE 0.1 MG TAB: 0.1 mg | ORAL | Qty: 1

## 2018-02-27 MED FILL — PREVACID SOLUTAB 30 MG DELAYED RELEASE,DISINTEGRATING TABLET: 30 mg | ORAL | Qty: 1

## 2018-02-27 MED FILL — CELECOXIB 200 MG CAP: 200 mg | ORAL | Qty: 1

## 2018-02-27 MED FILL — BD POSIFLUSH NORMAL SALINE 0.9 % INJECTION SYRINGE: INTRAMUSCULAR | Qty: 20

## 2018-02-27 MED FILL — ACETAMINOPHEN 325 MG TABLET: 325 mg | ORAL | Qty: 2

## 2018-02-27 NOTE — Progress Notes (Signed)
Problem: Falls - Risk of  Goal: *Absence of Falls  Description  Document Schmid Fall Risk and appropriate interventions in the flowsheet.  Outcome: Progressing Towards Goal  Note: Fall Risk Interventions:            Medication Interventions: Bed/chair exit alarm, Evaluate medications/consider consulting pharmacy, Patient to call before getting OOB, Teach patient to arise slowly                   Problem: Patient Education: Go to Patient Education Activity  Goal: Patient/Family Education  Outcome: Progressing Towards Goal

## 2018-02-27 NOTE — Progress Notes (Signed)
Progress Notes by Salley Slaughter, MD at 02/27/18 1156                Author: Salley Slaughter, MD  Service: Hospitalist  Author Type: Physician       Filed: 02/27/18 1157  Date of Service: 02/27/18 1156  Status: Signed          Editor: Salley Slaughter, MD (Physician)                          NOTIFICATION RETURN  TO WORK       February 27, 2018, 11:56 AM      Thera Flake         To Whom It May Concern:         BOOTS EMMERICH was admitted to [facility] on 02/24/2018  6:46 PM        Adamarys DORETHEA WEXLER was discharged from [facility] on 02/27/2018      She may return to work on 03/01/2018      If there are questions or concerns please have the patient contact our office.            Sincerely,         Tia Masker, MD   [facility]

## 2018-02-27 NOTE — Progress Notes (Signed)
PAGER ID: [phone]   MESSAGE: [name] [name] , 54yrs. old, room 2221, admitted for abd pain, vomit & nausea, pt. of Dr. Maryelizabeth Kaufmann, needs Tylenol for temp of 99.3. #[id number], Thanks, Fayrene Fearing. Dr. Langston Masker ordered tylenol 650 mg po q6 prn.

## 2018-02-27 NOTE — Discharge Summary (Signed)
Discharge Summary by Salley Slaughter, MD at 02/27/18 1148                Author: Salley Slaughter, MD  Service: Hospitalist  Author Type: Physician       Filed: 02/27/18 1156  Date of Service: 02/27/18 1148  Status: Signed          Editor: Salley Slaughter, MD (Physician)                  Discharge Summary                   Patient ID:   [name] [name], 33 y.o.,  female   DOB: 1985/08/14      Admit Date: 02/24/2018   Discharge Date:  02/27/2018   Length of stay: 0 day(s)      PCP:  Lyndon Code, MD        Chief Complaint       Patient presents with        ?  Abdominal Pain     ?  Vomiting        ?  Nausea              Hospital Problems   Date Reviewed:  02/07/2015                         Codes  Class  Noted  POA              Intractable vomiting  ICD-10-CM: R11.10   ICD-9-CM: 536.2    08/08/2017  Unknown                          Discharge Diagnosis:    1.  Intractable nausea vomiting due to cyclic vomiting syndrome and diabetic gastroparesis- improving   2.  Type 1 diabetes mellitus uncontrolled with hyperglycemia A1c 9.2   3.  Diabetic gastroparesis status post gastric stimulator   4.  Chronic abdominal pain   5.  Hypertension-uncontrolled   6.  Marijuana use - occasional      Hospital course      Ms. Meenach was admitted to medical floor. She was started on supportive treatment, IV fluids, antiemetics. She was given subcutaneous insulin.   She complained of abdominal pain at the site of gastric stimulator. This pain is not new per patient. She is going to follow up with her gastroenterologist at [facility] to address the stimulator.   Her blood pressure was uncontrolled. Mostly her abdominal pain was driving the elevated blood pressure. She was started on clonidine patch. Lisinopril was continued.   Her nausea eventually improved. She is tolerating regular diabetic diet. She is requesting to be discharged home on 02/27/2018   I discussed with patient that we can try amitriptyline 25 mg at bedtime for  cyclical vomiting syndrome as she has recurrent symptoms all the time. She agreed to try this.         Discharge instructions:     CBC BMP in one week - script provided to patient. She will follow up with her PCP   Discussed with patient to check blood pressure regularly at home      Follow-up:   PCP Dr. Elvis Coil -one week   Outpatient gastroenterologist in [place] - as scheduled         HPI on Admission (per admitting physician Dr. Langston Masker):   [name] Bison  [name] is a 34 y.o. female with hx of HTN, IDDM, Gastroparesis, and Cyclic Vomiting syndrome with c/o persistent intractable nausea and vomiting since around 12 noon.   Patient denies any fever or chills.  She states that she was seen in the ED for hypoglycemia and subsequently discharged home.  Her symptoms began after she returned home.  She has had abdominal pain which she describes as stabbing in nature.  Zofran  per EMS did not provide relief.  Patient was initially placed on IV insulin drip per protocol with plans to  transition to SQ insulin with improvement of her overall condition.            Condition at discharge:   stable      Disposition: home      Code status : full code            Physical Exam on Discharge:   Visit Vitals      BP  (!) 154/92 (BP 1 Location: Left arm, BP Patient Position: Supine)     Pulse  86     Temp  98.6 ??F (37 ??C)     Resp  19     Ht  5\' 6"  (1.676 m)     Wt  57.4 kg (126 lb 8.7 oz)     LMP  01/30/2018 (Approximate)     SpO2  100%     Breastfeeding  No        BMI  20.42 kg/m??        Gen - AAOx3, not in distress, cheerful   HEENT - , mucous membranes moist   Neck - supple, no JVD   Cardiac - RRR, S1, S2, no murmurs   Chest/Lungs - clear to auscultation without wheezes or rhonchi   Abdomen - soft, mild tenderness on deep palpation around gastric stimulator site   Extremities - no clubbing/ cyanosis/ edema   Neuro - CN 2-12 intact. No motor or sensory deficit.   Skin - no rashes or lesions      Discharge medications :     Current  Discharge Medication List              START taking these medications          Details        amitriptyline (ELAVIL) 25 mg tablet  Take 1 Tab by mouth nightly.   Qty: 30 Tab, Refills:  0               cloNIDine (CATAPRES) 0.1 mg/24 hr ptwk  1 Patch by TransDERmal route every seven (7) days.   Qty: 4 Patch, Refills:  0               lansoprazole (PREVACID SOLUTAB) 30 mg disintegrating tablet  Take 1 Tab by mouth Daily (before breakfast).   Qty: 30 Tab, Refills:  0               promethazine (PHENERGAN) 12.5 mg tablet  Take 1 Tab by mouth every six (6) hours as needed for Nausea.   Qty: 30 Tab, Refills:  0               promethazine (PHENERGAN) 12.5 mg suppository  Insert 1 Suppository into rectum every six (6) hours as needed for Nausea for up to 7 days.   Qty: 28 Suppository, Refills:  0  CONTINUE these medications which have CHANGED          Details        lisinopril (PRINIVIL, ZESTRIL) 20 mg tablet  Take 1 Tab by mouth daily.   Qty: 30 Tab, Refills:  0                     CONTINUE these medications which have NOT CHANGED          Details        insulin aspart U-100 (NOVOLOG FLEXPEN U-100 INSULIN) 100 unit/mL (3 mL) inpn  Before breakfast, lunch, dinner and at bedtime. Inject subcutaneously per sliding scale 3 times a day.               insulin glargine (LANTUS,BASAGLAR) 100 unit/mL (3 mL) inpn  28 Units by SubCUTAneous route every evening.                               Most Recent Labs:     Recent Results (from the past 24 hour(s))     GLUCOSE, POC          Collection Time: 02/26/18 11:59 AM         Result  Value  Ref Range            Glucose (POC)  179 (H)  65 - 105 mg/dL       GLUCOSE, POC          Collection Time: 02/26/18  4:37 PM         Result  Value  Ref Range            Glucose (POC)  152 (H)  65 - 105 mg/dL       GLUCOSE, POC          Collection Time: 02/26/18  9:09 PM         Result  Value  Ref Range            Glucose (POC)  230 (H)  65 - 105 mg/dL       GLUCOSE, POC           Collection Time: 02/27/18  7:55 AM         Result  Value  Ref Range            Glucose (POC)  263 (H)  65 - 105 mg/dL              XR Results:     Results from Hospital Encounter encounter on 04/20/16     XR CHEST PA LAT           Narrative  Indication: Chest pain   PA and lateral chest        The heart is normal in size. Lungs are clear and the bony thorax is intact. No   effusions or  other abnormalities are seen.              Impression  Impression: Normal study.              CT Results:     Results from Hospital Encounter encounter on 12/24/17     CT HEAD WO CONT           Narrative  INDICATION:   intermittent HA elevated bp.          COMPARISON:   08/25/2013      TECHNIQUE:  The CT scan of the head was performed without contrast. Coronal and sagittal   reconstructions were obtained.   DICOM format image data is available to non-affiliated external healthcare   facilities or entities on a secure, media free, reciprocally searchable basis   with patient authorization for 12 months following the date of the study.      FINDINGS:   The ventricular system shows normal dimensions and morphology. The white and   gray matter is well differentiated. No focal areas of abnormal density are seen.   There is no intracranial bleed or evidence of an acute infarct.  No   identification of a fracture.  Normal sinuses and mastoids.                       Impression  IMPRESSION:    Negative unenhanced CT head scan.                    MRI Results:   No results found for this or any previous visit.      Nuclear Medicine Results:     Results from Hospital Encounter encounter on 04/20/15     NM GASTRIC EMPTY STDY           Narrative  Indication: Diabetic gastroparesis, nausea      Nuclide: Technetium sulfur colloid 1 mCi by mouth in orange juice      The T1 half for gastric emptying is calculated to be 87 minutes with normal 10   to 40 minutes.              Impression  IMPRESSION: Prolonged gastric emptying suggesting gastroparesis.  Partial gastric   outlet obstruction can also have this appearance in the appropriate clinical   setting.              US Results:     Results from Hospital Encounter encounter on 12/25/14     US BREAST BI COMPLETE           Narrative  - US BREAST BI COMPLETE   ULTRASOUND OF BOTH BREASTS: 12/25/2014   CLINICAL: Palpable lumps both breasts.        Comparison is made to exam dated:  [facility]/2014 [facility].     Real-time ultrasound of both breasts was performed.  Gray scale images of the    real-time examination were reviewed.      Complete whole breast ultrasound was performed bilaterally including all four    quadrants, the retroareolar region and the axilla.  No suspicious findings were    seen.  Mammogram not performed due to patient age.        IMPRESSION: BENIGN - FOLLOW-UP RECOMMENDED      There is no sonographic evidence of malignancy.  There are no sonographic    abnormalities seen in either breast to correspond with the palpable    nodularities, however, clinical correlation and clinical followup are    recommended.        The patient will be sent the results by mail.  Findings were discussed with the    patient at the time of exam. The patient was advised to return sooner for    evaluation if symptoms worsen.        Wynelle FannyNATALIE SIMMONS M.D.     ns/:12/25/2014 10:30:10           letter sent: Clinical Recommended     Ultrasound BI-RADS: 2 Benign  IR Results:   No results found for this or any previous visit.      VAS/US Results:   No results found for this or any previous visit.         Total discharge time 40 minutes.         Tia Masker, MD   [facility]   February 27, 2018   11:48 AM

## 2018-02-27 NOTE — Progress Notes (Addendum)
PAGER ID: [phone]   MESSAGE: [name] [name] , 32yrs. old, room 2221, admitted for abd pain, vomit & nausea, pt. of Dr. Munoz, needs Tylenol for temp of 99.3. #[id number], Thanks, James. Dr. Morris ordered tylenol 650 mg po q6 prn.

## 2018-02-27 NOTE — Other (Addendum)
----------  DocumentID: [id number]------------------------------------------------              [facility]                       Patient Education Report         Name: [name] [name], [name] [name]                  Date: 02/25/2018    MRN: [id number]                    Time: 5:25:56 AM         Patient ordered video: 'Patient Safety: Stay Safe While you are in the Hospital'    from [id number]_[email or web address]_[id number] via phone number: [phone] at 5:25:56 AM    Description: This program outlines some of the precautions patients can take to ensure a speedy recovery without extra complications. The video emphasizes the importance of communicating with the healthcare team.    ----------DocumentID: [id number]------------------------------------------------                       [facility]          Patient Education Report - Discharge Summary        Date: 02/27/2018   Time: 2:13:40 PM   Name: [name] [name], [name] [name]   MRN: [id number]      Account Number: [id number]      Education History:        Patient ordered video: 'Patient Safety: Stay Safe While you are in the Hospital' from [id number]_1 on 02/25/2018 05:25:56 AM

## 2018-02-27 NOTE — Progress Notes (Signed)
NOTIFICATION RETURN TO WORK     February 27, 2018, 11:56 AM    [name] [name]      To Whom It May Concern:      [name] [name] was admitted to [facility] on 02/24/2018  6:46 PM      [name] [name] was discharged from [facility] on 02/27/2018    She may return to work on 03/01/2018    If there are questions or concerns please have the patient contact our office.        Sincerely,      Tia Masker, MD  [facility]

## 2018-02-27 NOTE — Discharge Summary (Signed)
Discharge Summary           Patient ID:  [name], 33 y.o., female  DOB: [birth year]    Admit Date: 02/24/2018  Discharge Date:  02/27/2018  Length of stay: 0 day(s)    PCP:  [name] Code, MD    Chief Complaint   Patient presents with   ??? Abdominal Pain   ??? Vomiting   ??? Nausea       Hospital Problems  Date Reviewed: 03/09/2015          Codes Class Noted POA    Intractable vomiting ICD-10-CM: R11.10  ICD-9-CM: 536.2  08/08/2017 Unknown              Discharge Diagnosis:   1. Intractable nausea vomiting due to cyclic vomiting syndrome and diabetic gastroparesis??? improving  2. Type 1 diabetes mellitus uncontrolled with hyperglycemia A1c 9.2  3. Diabetic gastroparesis status post gastric stimulator  4. Chronic abdominal pain  5. Hypertension???uncontrolled  6. Marijuana use - occasional    Hospital course    [name] was admitted to medical floor. She was started on supportive treatment, IV fluids, antiemetics. She was given subcutaneous insulin.  She complained of abdominal pain at the site of gastric stimulator. This pain is not new per patient. She is going to follow up with her gastroenterologist at [name] [name] to address the stimulator.  Her blood pressure was uncontrolled. Mostly her abdominal pain was driving the elevated blood pressure. She was started on clonidine patch. Lisinopril was continued.  Her nausea eventually improved. She is tolerating regular diabetic diet. She is requesting to be discharged home on 02/27/2018  I discussed with patient that we can try amitriptyline 25 mg at bedtime for cyclical vomiting syndrome as she has recurrent symptoms all the time. She agreed to try this.      Discharge instructions:    CBC BMP in one week - script provided to patient. She will follow up with her PCP  Discussed with patient to check blood pressure regularly at home    Follow-up:  PCP Dr. Elvis [name] -one week  Outpatient gastroenterologist in [name] - as scheduled       HPI on Admission (per admitting physician Dr. Langston [name]):  [name] is a 32 y.o. female with hx of HTN, IDDM, Gastroparesis, and Cyclic Vomiting syndrome with c/o persistent intractable nausea and vomiting since around 12 noon.  Patient denies any fever or chills.  She states that she was seen in the ED for hypoglycemia and subsequently discharged home.  Her symptoms began after she returned home.  She has had abdominal pain which she describes as stabbing in nature.  Zofran per EMS did not provide relief.  Patient was initially placed on IV insulin drip per protocol with plans to  transition to SQ insulin with improvement of her overall condition.        Condition at discharge:  stable    Disposition: home    [name] status : full [name]        Physical Exam on Discharge:  Visit Vitals  BP (!) 154/92 (BP 1 Location: Left arm, BP Patient Position: Supine)   Pulse 86   Temp 98.6 ??F (37 ??C)   Resp 19   Ht 5\' 6"  (1.676 m)   Wt 57.4 kg (126 lb 8.7 oz)   LMP 01/30/2018 (Approximate)   SpO2 100%   Breastfeeding No   BMI 20.42 kg/m??     Gen - AAOx3, not in  distress, cheerful  HEENT - , mucous membranes moist  Neck - supple, no JVD  Cardiac - RRR, S1, S2, no murmurs  Chest/Lungs - clear to auscultation without wheezes or rhonchi  Abdomen - soft, mild tenderness on deep palpation around gastric stimulator site  Extremities - no clubbing/ cyanosis/ edema  Neuro - CN 2-12 intact. No motor or sensory deficit.  Skin - no rashes or lesions    Discharge medications :  Current Discharge Medication List      START taking these medications    Details   amitriptyline (ELAVIL) 25 mg tablet Take 1 Tab by mouth nightly.  Qty: 30 Tab, Refills: 0      cloNIDine (CATAPRES) 0.1 mg/24 hr ptwk 1 Patch by TransDERmal route every seven (7) days.  Qty: 4 Patch, Refills: 0      lansoprazole (PREVACID SOLUTAB) 30 mg disintegrating tablet Take 1 Tab by mouth Daily (before breakfast).  Qty: 30 Tab, Refills: 0       promethazine (PHENERGAN) 12.5 mg tablet Take 1 Tab by mouth every six (6) hours as needed for Nausea.  Qty: 30 Tab, Refills: 0      promethazine (PHENERGAN) 12.5 mg suppository Insert 1 Suppository into rectum every six (6) hours as needed for Nausea for up to 7 days.  Qty: 28 Suppository, Refills: 0         CONTINUE these medications which have CHANGED    Details   lisinopril (PRINIVIL, ZESTRIL) 20 mg tablet Take 1 Tab by mouth daily.  Qty: 30 Tab, Refills: 0         CONTINUE these medications which have NOT CHANGED    Details   insulin aspart U-100 (NOVOLOG FLEXPEN U-100 INSULIN) 100 unit/mL (3 mL) inpn Before breakfast, lunch, dinner and at bedtime. Inject subcutaneously per sliding scale 3 times a day.      insulin glargine (LANTUS,BASAGLAR) 100 unit/mL (3 mL) inpn 28 Units by SubCUTAneous route every evening.                 Most Recent Labs:  Recent Results (from the past 24 hour(s))   GLUCOSE, POC    Collection Time: 02/26/18 11:59 AM   Result Value Ref Range    Glucose (POC) 179 (H) 65 - 105 mg/dL   GLUCOSE, POC    Collection Time: 02/26/18  4:37 PM   Result Value Ref Range    Glucose (POC) 152 (H) 65 - 105 mg/dL   GLUCOSE, POC    Collection Time: 02/26/18  9:09 PM   Result Value Ref Range    Glucose (POC) 230 (H) 65 - 105 mg/dL   GLUCOSE, POC    Collection Time: 02/27/18  7:55 AM   Result Value Ref Range    Glucose (POC) 263 (H) 65 - 105 mg/dL         XR Results:  Results from Hospital Encounter encounter on 04/20/16   XR CHEST PA LAT    Narrative Indication: Chest pain  PA and lateral chest      The heart is normal in size. Lungs are clear and the bony thorax is intact. No  effusions or  other abnormalities are seen.      Impression Impression: Normal study.         CT Results:  Results from Hospital Encounter encounter on 12/24/17   CT HEAD WO CONT    Narrative INDICATION:  intermittent HA elevated bp.        COMPARISON:  08/25/2013    TECHNIQUE:   The CT scan of the head was performed without contrast. Coronal and sagittal  reconstructions were obtained.  DICOM format image data is available to non-affiliated external healthcare  facilities or entities on a secure, media free, reciprocally searchable basis  with patient authorization for 12 months following the date of the study.    FINDINGS:  The ventricular system shows normal dimensions and morphology. The white and  gray matter is well differentiated. No focal areas of abnormal density are seen.  There is no intracranial bleed or evidence of an acute infarct.  No  identification of a fracture.  Normal sinuses and mastoids.             Impression IMPRESSION:   Negative unenhanced CT head scan.              MRI Results:  No results found for this or any previous visit.    Nuclear Medicine Results:  Results from Hospital Encounter encounter on 04/20/15   NM GASTRIC EMPTY STDY    Narrative Indication: Diabetic gastroparesis, nausea    Nuclide: Technetium sulfur colloid 1 mCi by mouth in orange juice    The T1 half for gastric emptying is calculated to be 87 minutes with normal 10  to 40 minutes.      Impression IMPRESSION: Prolonged gastric emptying suggesting gastroparesis. Partial gastric  outlet obstruction can also have this appearance in the appropriate clinical  setting.         [place] Results:  Results from Hospital Encounter encounter on 12/25/14   US BREAST BI COMPLETE    Narrative - US BREAST BI COMPLETE  ULTRASOUND OF BOTH BREASTS: 12/25/2014  CLINICAL: Palpable lumps both breasts.      Comparison is made to exam dated:  02/21/2014 [facility].    Real-time ultrasound of both breasts was performed.  Gray scale images of the   real-time examination were reviewed.     Complete whole breast ultrasound was performed bilaterally including all four   quadrants, the retroareolar region and the axilla.  No suspicious findings were   seen.  Mammogram not performed due to patient age.       IMPRESSION: BENIGN - FOLLOW-UP RECOMMENDED    There is no sonographic evidence of malignancy.  There are no sonographic   abnormalities seen in either breast to correspond with the palpable   nodularities, however, clinical correlation and clinical followup are   recommended.      The patient will be sent the results by mail.  Findings were discussed with the   patient at the time of exam. The patient was advised to return sooner for   evaluation if symptoms worsen.      Dorene Grebe SIMMONS M.D.    ns/:12/25/2014 10:30:10        letter sent: Clinical Recommended    Ultrasound BI-RADS: 2 Benign       IR Results:  No results found for this or any previous visit.    VAS/US Results:  No results found for this or any previous visit.      Total discharge time 40 minutes.      Tia Masker, MD  [facility]  February 27, 2018  11:48 AM

## 2018-03-22 DIAGNOSIS — R112 Nausea with vomiting, unspecified: Secondary | ICD-10-CM

## 2018-03-22 NOTE — ED Notes (Signed)
Brought in by EMS for abdominal pain and nausea

## 2018-03-22 NOTE — ED Notes (Signed)
No vomiting since arrival. Family member at bedside. I have reviewed discharge instructions with the patient.  The patient verbalized understanding.

## 2018-03-22 NOTE — ED Provider Notes (Signed)
ED Provider Notes by Clerance Lav, [place] at 03/22/18 2018                Author: Clerance Lav, [place]  Service: Emergency Medicine  Author Type: Physician Assistant       Filed: 03/22/18 2238  Date of Service: 03/22/18 2018  Status: Attested Addendum          Editor: Clerance Lav, [place] (Physician Assistant)       Related Notes: Original Note by Clerance Lav, [place] (Physician Assistant) filed at 03/22/18  2220          Cosigner: Lennox Laity, MD at 03/22/18 2252          Attestation signed by Lennox Laity, MD at 03/22/18 2252          I, Lennox Laity, MD , have personally seen and examined this patient; I have fully participated in the care of this patient with the advanced practice provider.   I have reviewed and agree with all pertinent clinical information including history, physical exam, studies and the plan.  I have also reviewed and agree with the medications, allergies and past medical history sections for this patient.      Lennox Laity, MD   March 22, 2018   Recheck her symptoms are improved.  She is tolerating oral fluids.  Had no further vomiting after being hydrated here.                                 [place]   Emergency Department Treatment Report          Patient: [name]  Age: 33 y.o.  Sex: female          Date of Birth: 08-07-1985  Admit Date: 03/22/2018  PCP: Pecola Lawless, MD     MRN: [id number]   CSN: [id number]   MD: Lennox Laity, MD         Room: [phone]  Time Dictated: 8:18 PM  APP: Lastacia Solum           Chief Complaint    Abdominal pain, vomiting     History of Present Illness     33 y.o. female  with history of diabetic gastroparesis, has gastric stimulator pump and cyclic vomiting syndrome, hypertension, diabetes type 1, presenting with generalized abdominal pain, nausea and too numerous to count episodes of vomiting.  Her pain began at 8 AM.   She describes it as all over my belly and severe, 9/10.  She denies fever, diarrhea, vaginal complaints  or discharge.  Patient was [facility] at [facility] on 2/12 for similar symptoms, treated symptomatically and sent home.  She has been admitted in the past  due to DKA.  She is followed by GI doctor in [place].  She denies alcohol or drug use including marijuana which she has used in the past.  States she checked her blood sugar and it was 154 this morning.  She has not eaten anything all day.        Review of Systems     Review of Systems    Constitutional: Negative for chills, diaphoresis and fever.    Respiratory: Negative for shortness of breath.     Cardiovascular: Negative for chest pain.    Gastrointestinal: Positive for abdominal pain, nausea  and vomiting. Negative for blood in stool, constipation and diarrhea.    Genitourinary: Negative  for dysuria and hematuria.    Musculoskeletal: Negative for back pain.    Skin: Negative for rash.    Neurological: Negative for sensory change, focal weakness, loss of consciousness and headaches.    Psychiatric/Behavioral: Negative for substance abuse.        All other systems reviewed and are negative.     Past Medical/Surgical History          Past Medical History:        Diagnosis  Date         ?  Cyclical vomiting  04/28/270     ?  Diabetes ([place])       ?  Diabetic ketoacidosis without coma associated with type 1 diabetes mellitus ([place])  11/05/2015     ?  Drug-seeking behavior       ?  Essential hypertension  03/27/2016          Amlodipine, carvedilol, lisinopril.         ?  Gastroparesis       ?  Gastroparesis       ?  Hypertension       ?  Insulin long-term use ([place])  11/20/2016     ?  Slow transit constipation  08/08/2017     ?  Type 1 diabetes mellitus ([place])  02/17/2009          Diagnosed at age 37. Had insulin pump at one time, but developed keloids at needle insertion sites. Was supposed to establish with [facility].          Past Surgical History:         Procedure  Laterality  Date          ?  ABDOMEN SURGERY PROC UNLISTED    05/2015          Gastric stimulator pump            ?  HX CESAREAN SECTION                 Social History          Social History          Socioeconomic History         ?  Marital status:  SINGLE              Spouse name:  Not on file         ?  Number of children:  Not on file     ?  Years of education:  Not on file     ?  Highest education level:  Not on file       Tobacco Use         ?  Smoking status:  Never Smoker     ?  Smokeless tobacco:  Never Used       Substance and Sexual Activity         ?  Alcohol use:  No     ?  Drug use:  Yes              Types:  Marijuana             Comment: last use 02/02/2018         ?  Sexual activity:  Yes              Birth control/protection:  Injection             Family History  Family History         Problem  Relation  Age of Onset          ?  Diabetes  Father       ?  Stroke  Other                great grandmother             Current Medications          Current Facility-Administered Medications             Medication  Dose  Route  Frequency  Provider  Last Rate  Last Dose              ?  cloNIDine (CATAPRES) 0.1 mg/24 hr patch 1 Patch   1 Patch  TransDERmal  NOW  Lennox Laity, MD     1 Patch at 03/22/18 2106              ?  lisinopril (PRINIVIL, ZESTRIL) tablet 20 mg   20 mg  Oral  NOW  Aymen Widrig, [place], [place]                Current Outpatient Medications          Medication  Sig  Dispense  Refill           ?  promethazine (PHENERGAN) 12.5 mg tablet  Take 1 Tab by mouth every six (6) hours as needed for Nausea.  15 Tab  0     ?  promethazine (PHENERGAN) 25 mg suppository  Insert 1 Suppository into rectum every six (6) hours as needed for Nausea for up to 7 days.  12 Suppository  0     ?  lisinopril (PRINIVIL, ZESTRIL) 20 mg tablet  Take 1 Tab by mouth daily.  30 Tab  0     ?  amitriptyline (ELAVIL) 25 mg tablet  Take 1 Tab by mouth nightly.  30 Tab  0     ?  cloNIDine (CATAPRES) 0.1 mg/24 hr ptwk  1 Patch by TransDERmal route every seven (7) days.  4 Patch  0     ?  lansoprazole (PREVACID SOLUTAB) 30 mg  disintegrating tablet  Take 1 Tab by mouth Daily (before breakfast).  30 Tab  0     ?  promethazine (PHENERGAN) 12.5 mg tablet  Take 1 Tab by mouth every six (6) hours as needed for Nausea.  30 Tab  0     ?  insulin aspart U-100 (NOVOLOG FLEXPEN U-100 INSULIN) 100 unit/mL (3 mL) inpn  Before breakfast, lunch, dinner and at bedtime. Inject subcutaneously per sliding scale 3 times a day.               ?  insulin glargine (LANTUS,BASAGLAR) 100 unit/mL (3 mL) inpn  28 Units by SubCUTAneous route every evening.                 Allergies          Allergies        Allergen  Reactions         ?  Morphine  Itching             Pt denies allergy         ?  Robaxin [Methocarbamol]  Other (comments)             Body shaking, nausea         ?  Penicillins  Hives         ?  Zithromax [Azithromycin]  Hives             Physical Exam        Visit Vitals      BP  (!) 194/99 (BP 1 Location: Right arm, BP Patient Position: At rest;Supine)     Pulse  90     Temp  98.4 ??F (36.9 ??C)     Resp  16     Ht  '5\' 6"'  (1.676 m)     Wt  57.6 kg (127 lb)     SpO2  100%        BMI  20.50 kg/m??        Physical Exam   Vitals signs reviewed.   Constitutional:        Appearance: She is not toxic-appearing or diaphoretic.      Comments: Rocking in bed   HENT:       Head: Normocephalic and atraumatic.   Eyes :       Conjunctiva/sclera: Conjunctivae normal.      Pupils: Pupils are equal, round, and reactive to light.    Neck:       Musculoskeletal: Normal range of motion and neck supple.   Cardiovascular :       Rate and Rhythm: Normal rate and regular rhythm.      Heart sounds: Normal heart sounds.    Pulmonary:       Effort: Pulmonary effort is normal.      Breath sounds: Normal breath sounds. No wheezing.    Abdominal:      General: Bowel sounds are normal.      Palpations: Abdomen is soft.      Tenderness: There is no abdominal tenderness. There is no guarding.     Musculoskeletal: Normal range of motion.          General: No deformity.      Lymphadenopathy:       Cervical: No cervical adenopathy.   Skin :      General: Skin is warm and dry.      Findings: No rash.    Neurological:       Mental Status: She is alert and oriented to person, place, and time.      Gait: Gait is intact.    Psychiatric:         Cognition and Memory: Memory normal.               Impression and Management Plan     33 year old female with history of diabetic gastroparesis and cyclic vomiting presenting with generalized abdominal pain and vomiting.  Her abdomen is soft without  guarding.  She is not vomiting on my examination.  She is hypertensive, blood pressure 182/124.  Apparently was placed on clonidine patch, will order clonidine patch in ED.   Will obtain CBC, CMP, lipase and urine dip and pregnancy   We will give Haldol, Ativan, Benadryl, Reglan and fluids     Diagnostic Studies     Lab:      Recent Results (from the past 12 hour(s))     CBC WITH AUTOMATED DIFF          Collection Time: 03/22/18  8:05 PM         Result  Value  Ref Range            WBC  16.1 (H)  4.0 - 11.0 1000/mm3  RBC  4.35  3.60 - 5.20 M/uL       HGB  11.5 (L)  13.0 - 17.2 gm/dl       HCT  35.3 (L)  37.0 - 50.0 %       MCV  81.1  80.0 - 98.0 fL       MCH  26.4  25.4 - 34.6 pg       MCHC  32.6  30.0 - 36.0 gm/dl       PLATELET  250  140 - 450 1000/mm3       MPV  13.3 (H)  6.0 - 10.0 fL       RDW-SD  44.0  36.4 - 46.3         NRBC  0  0 - 0         IMMATURE GRANULOCYTES  0.4  0.0 - 3.0 %       NEUTROPHILS  81.0 (H)  34 - 64 %       LYMPHOCYTES  14.2 (L)  28 - 48 %       MONOCYTES  4.0  1 - 13 %       EOSINOPHILS  0.0  0 - 5 %       BASOPHILS  0.4  0 - 3 %       METABOLIC PANEL, COMPREHENSIVE          Collection Time: 03/22/18  8:05 PM         Result  Value  Ref Range            Sodium  136  136 - 145 mEq/L       Potassium  3.5  3.5 - 5.1 mEq/L       Chloride  104  98 - 107 mEq/L       CO2  25  21 - 32 mEq/L       Glucose  116 (H)  74 - 106 mg/dl       BUN  10  7 - 25 mg/dl       Creatinine  1.0  0.6  - 1.3 mg/dl       GFR est AA  >60.0          GFR est non-AA  >60          Calcium  9.5  8.5 - 10.1 mg/dl       AST (SGOT)  21  15 - 37 U/L       ALT (SGPT)  24  12 - 78 U/L       Alk. phosphatase  94  45 - 117 U/L       Bilirubin, total  0.4  0.2 - 1.0 mg/dl       Protein, total  8.2  6.4 - 8.2 gm/dl       Albumin  4.0  3.4 - 5.0 gm/dl       Anion gap  8  5 - 15 mmol/L       LIPASE          Collection Time: 03/22/18  8:05 PM         Result  Value  Ref Range            Lipase  84  73 - 393 U/L           Imaging:     No results found.        Medical Decision Making/ED Course  Patient had no vomiting while in ED. Albumin and protein normal. Her systolic blood pressure went from 182-194 after giving clonidine patch, diastolic improved.   She has not taken any of her blood pressure medications today due to vomiting.  Will give lisinopril 20 mg which she takes daily.  She denies chest pain, shortness of breath, concerning symptoms for hypertensive urgency or emergency.  Discussed with patient  to continue her medications at home.  We will give her Phenergan suppositories and have her monitor blood pressure at home.  Patient remained clinically stable throughout the Emergency Department visit.  Vitals were unremarkable and the patient's condition  required no further ED intervention.  The patient is stable for outpatient management with appropriate symptomatic treatment, return precautions, and was advised of the importance of outpatient follow up for ongoing care        Medications       cloNIDine (CATAPRES) 0.1 mg/24 hr patch 1 Patch (1 Patch TransDERmal Apply Patch 03/22/18 2106)     lisinopril (PRINIVIL, ZESTRIL) tablet 20 mg (has no administration in time range)     sodium chloride (NS) flush 5-10 mL (10 mL IntraVENous Given 03/22/18 2106)     haloperidol lactate (HALDOL) injection 2.5 mg (2.5 mg IntraVENous Given 03/22/18 2025)     diphenhydrAMINE (BENADRYL) injection 25 mg (25 mg IntraVENous Given 03/22/18 2025)      LORazepam (ATIVAN) injection 1 mg (1 mg IntraVENous Given 03/22/18 2105)       metoclopramide HCl (REGLAN) injection 10 mg (10 mg IntraVENous Given 03/22/18 2027)          Final Diagnosis                 ICD-10-CM  ICD-9-CM          1.  History of diabetic gastroparesis  Z86.39  V12.29     2.  Hypertension, unspecified type  I10  401.9          3.  Non-intractable vomiting with nausea, unspecified vomiting type  R11.2  787.01          Disposition        home        Current Discharge Medication List              START taking these medications          Details        !! promethazine (PHENERGAN) 12.5 mg tablet  Take 1 Tab by mouth every six (6) hours as needed for Nausea.   Qty: 15 Tab, Refills:  0               promethazine (PHENERGAN) 25 mg suppository  Insert 1 Suppository into rectum every six (6) hours as needed for Nausea for up to 7 days.   Qty: 12 Suppository, Refills:  0               !! - Potential duplicate medications found. Please discuss with provider.              CONTINUE these medications which have NOT CHANGED          Details        !! promethazine (PHENERGAN) 12.5 mg tablet  Take 1 Tab by mouth every six (6) hours as needed for Nausea.   Qty: 30 Tab, Refills:  0               !! - Potential duplicate medications found. Please discuss  with provider.               Patient was discharged home in stable condition with discharge instructions on the same.       Return to the ER if condition worsens or new symptoms develop.    Follow up with GI as discussed.       The patient was personally evaluated by myself and  KISA, Pete Pelt, MD who agrees with the above assessment and plan.      Clerance Lav, PA-C   March 22, 2018         *Portions of this electronic record were dictated using Systems analyst.  Unintended errors in translation may occur.      My signature above authenticates this document and my orders, the final ??   diagnosis (es), discharge prescription (s), and instructions in  the Epic ??   record.   If you have any questions please contact [phone].   ??   Nursing notes have been reviewed by the physician/ advanced practice ??   Clinician.

## 2018-03-22 NOTE — ED Notes (Signed)
No vomiting since arrival. Family member at bedside. I have reviewed discharge instructions with the patient.  The patient verbalized understanding.

## 2018-03-22 NOTE — ED Provider Notes (Addendum)
[facility]  Emergency Department Treatment Report    Patient: [name] [name] Age: 33 y.o. Sex: female    Date of Birth: 02/16/1985 Admit Date: 03/22/2018 PCP: Pecola Lawless, MD   MRN: [id number]  CSN: [id number]  MD: Lennox Laity, MD   Room: [phone] Time Dictated: 8:18 PM APP: Denis Koppel       Chief Complaint   Abdominal pain, vomiting  History of Present Illness   33 y.o. female with history of diabetic gastroparesis, has gastric stimulator pump and cyclic vomiting syndrome, hypertension, diabetes type 1, presenting with generalized abdominal pain, nausea and too numerous to count episodes of vomiting.  Her pain began at 8 AM.  She describes it as all over my belly and severe, 9/10.  She denies fever, diarrhea, vaginal complaints or discharge.  Patient was [facility] at [facility] on 2/12 for similar symptoms, treated symptomatically and sent home.  She has been admitted in the past due to DKA.  She is followed by GI doctor in [place].  She denies alcohol or drug use including marijuana which she has used in the past.  States she checked her blood sugar and it was 154 this morning.  She has not eaten anything all day.    Review of Systems   Review of Systems   Constitutional: Negative for chills, diaphoresis and fever.   Respiratory: Negative for shortness of breath.    Cardiovascular: Negative for chest pain.   Gastrointestinal: Positive for abdominal pain, nausea and vomiting. Negative for blood in stool, constipation and diarrhea.   Genitourinary: Negative for dysuria and hematuria.   Musculoskeletal: Negative for back pain.   Skin: Negative for rash.   Neurological: Negative for sensory change, focal weakness, loss of consciousness and headaches.   Psychiatric/Behavioral: Negative for substance abuse.      All other systems reviewed and are negative.  Past Medical/Surgical History     Past Medical History:   Diagnosis Date   ??? Cyclical vomiting 0/35/0093   ??? Diabetes ([place])     ??? Diabetic ketoacidosis without coma associated with type 1 diabetes mellitus ([place]) 11/05/2015   ??? Drug-seeking behavior    ??? Essential hypertension 03/27/2016    Amlodipine, carvedilol, lisinopril.   ??? Gastroparesis    ??? Gastroparesis    ??? Hypertension    ??? Insulin long-term use ([place]) 11/20/2016   ??? Slow transit constipation 08/08/2017   ??? Type 1 diabetes mellitus ([place]) 02/17/2009    Diagnosed at age 37. Had insulin pump at one time, but developed keloids at needle insertion sites. Was supposed to establish with [facility].     Past Surgical History:   Procedure Laterality Date   ??? ABDOMEN SURGERY PROC UNLISTED  05/2015    Gastric stimulator pump    ??? HX CESAREAN SECTION         Social History     Social History     Socioeconomic History   ??? Marital status: SINGLE     Spouse name: Not on file   ??? Number of children: Not on file   ??? Years of education: Not on file   ??? Highest education level: Not on file   Tobacco Use   ??? Smoking status: Never Smoker   ??? Smokeless tobacco: Never Used   Substance and Sexual Activity   ??? Alcohol use: No   ??? Drug use: Yes     Types: Marijuana     Comment: last use 02/02/2018   ???  Sexual activity: Yes     Birth control/protection: Injection       Family History     Family History   Problem Relation Age of Onset   ??? Diabetes Father    ??? Stroke Other         great grandmother       Current Medications     Current Facility-Administered Medications   Medication Dose Route Frequency Provider Last Rate Last Dose   ??? cloNIDine (CATAPRES) 0.1 mg/24 hr patch 1 Patch  1 Patch TransDERmal NOW Lennox Laity, MD   1 Patch at 03/22/18 2106   ??? lisinopril (PRINIVIL, ZESTRIL) tablet 20 mg  20 mg Oral NOW [place], [place], [place]         Current Outpatient Medications   Medication Sig Dispense Refill   ??? promethazine (PHENERGAN) 12.5 mg tablet Take 1 Tab by mouth every six (6) hours as needed for Nausea. 15 Tab 0   ??? promethazine (PHENERGAN) 25 mg suppository Insert 1 Suppository into  rectum every six (6) hours as needed for Nausea for up to 7 days. 12 Suppository 0   ??? lisinopril (PRINIVIL, ZESTRIL) 20 mg tablet Take 1 Tab by mouth daily. 30 Tab 0   ??? amitriptyline (ELAVIL) 25 mg tablet Take 1 Tab by mouth nightly. 30 Tab 0   ??? cloNIDine (CATAPRES) 0.1 mg/24 hr ptwk 1 Patch by TransDERmal route every seven (7) days. 4 Patch 0   ??? lansoprazole (PREVACID SOLUTAB) 30 mg disintegrating tablet Take 1 Tab by mouth Daily (before breakfast). 30 Tab 0   ??? promethazine (PHENERGAN) 12.5 mg tablet Take 1 Tab by mouth every six (6) hours as needed for Nausea. 30 Tab 0   ??? insulin aspart U-100 (NOVOLOG FLEXPEN U-100 INSULIN) 100 unit/mL (3 mL) inpn Before breakfast, lunch, dinner and at bedtime. Inject subcutaneously per sliding scale 3 times a day.     ??? insulin glargine (LANTUS,BASAGLAR) 100 unit/mL (3 mL) inpn 28 Units by SubCUTAneous route every evening.         Allergies     Allergies   Allergen Reactions   ??? Morphine Itching     Pt denies allergy   ??? Robaxin [Methocarbamol] Other (comments)     Body shaking, nausea   ??? Penicillins Hives   ??? Zithromax [Azithromycin] Hives       Physical Exam     Visit Vitals  BP (!) 194/99 (BP 1 Location: Right arm, BP Patient Position: At rest;Supine)   Pulse 90   Temp 98.4 ??F (36.9 ??C)   Resp 16   Ht '5\' 6"'$  (1.676 m)   Wt 57.6 kg (127 lb)   SpO2 100%   BMI 20.50 kg/m??     Physical Exam  Vitals signs reviewed.   Constitutional:       Appearance: She is not toxic-appearing or diaphoretic.      Comments: Rocking in bed   HENT:      Head: Normocephalic and atraumatic.   Eyes:      Conjunctiva/sclera: Conjunctivae normal.      Pupils: Pupils are equal, round, and reactive to light.   Neck:      Musculoskeletal: Normal range of motion and neck supple.   Cardiovascular:      Rate and Rhythm: Normal rate and regular rhythm.      Heart sounds: Normal heart sounds.   Pulmonary:      Effort: Pulmonary effort is normal.      Breath sounds:  Normal breath sounds. No wheezing.    Abdominal:      General: Bowel sounds are normal.      Palpations: Abdomen is soft.      Tenderness: There is no abdominal tenderness. There is no guarding.   Musculoskeletal: Normal range of motion.         General: No deformity.   Lymphadenopathy:      Cervical: No cervical adenopathy.   Skin:     General: Skin is warm and dry.      Findings: No rash.   Neurological:      Mental Status: She is alert and oriented to person, place, and time.      Gait: Gait is intact.   Psychiatric:         Cognition and Memory: Memory normal.         Impression and Management Plan   33 year old female with history of diabetic gastroparesis and cyclic vomiting presenting with generalized abdominal pain and vomiting.  Her abdomen is soft without guarding.  She is not vomiting on my examination.  She is hypertensive, blood pressure 182/124.  Apparently was placed on clonidine patch, will order clonidine patch in ED.  Will obtain CBC, CMP, lipase and urine dip and pregnancy  We will give Haldol, Ativan, Benadryl, Reglan and fluids  Diagnostic Studies   Lab:   Recent Results (from the past 12 hour(s))   CBC WITH AUTOMATED DIFF    Collection Time: 03/22/18  8:05 PM   Result Value Ref Range    WBC 16.1 (H) 4.0 - 11.0 1000/mm3    RBC 4.35 3.60 - 5.20 M/uL    HGB 11.5 (L) 13.0 - 17.2 gm/dl    HCT 35.3 (L) 37.0 - 50.0 %    MCV 81.1 80.0 - 98.0 fL    MCH 26.4 25.4 - 34.6 pg    MCHC 32.6 30.0 - 36.0 gm/dl    PLATELET 250 140 - 450 1000/mm3    MPV 13.3 (H) 6.0 - 10.0 fL    RDW-SD 44.0 36.4 - 46.3      NRBC 0 0 - 0      IMMATURE GRANULOCYTES 0.4 0.0 - 3.0 %    NEUTROPHILS 81.0 (H) 34 - 64 %    LYMPHOCYTES 14.2 (L) 28 - 48 %    MONOCYTES 4.0 1 - 13 %    EOSINOPHILS 0.0 0 - 5 %    BASOPHILS 0.4 0 - 3 %   METABOLIC PANEL, COMPREHENSIVE    Collection Time: 03/22/18  8:05 PM   Result Value Ref Range    Sodium 136 136 - 145 mEq/L    Potassium 3.5 3.5 - 5.1 mEq/L    Chloride 104 98 - 107 mEq/L    CO2 25 21 - 32 mEq/L    Glucose 116 (H) 74 - 106 mg/dl     BUN 10 7 - 25 mg/dl    Creatinine 1.0 0.6 - 1.3 mg/dl    GFR est AA >60.0      GFR est non-AA >60      Calcium 9.5 8.5 - 10.1 mg/dl    AST (SGOT) 21 15 - 37 U/L    ALT (SGPT) 24 12 - 78 U/L    Alk. phosphatase 94 45 - 117 U/L    Bilirubin, total 0.4 0.2 - 1.0 mg/dl    Protein, total 8.2 6.4 - 8.2 gm/dl    Albumin 4.0 3.4 - 5.0 gm/dl    Anion gap  8 5 - 15 mmol/L   LIPASE    Collection Time: 03/22/18  8:05 PM   Result Value Ref Range    Lipase 84 73 - 393 U/L       Imaging:    No results found.    Medical Decision Making/ED Course   Patient had no vomiting while in ED. Albumin and protein normal. Her systolic blood pressure went from 182-194 after giving clonidine patch, diastolic improved.  She has not taken any of her blood pressure medications today due to vomiting.  Will give lisinopril 20 mg which she takes daily.  She denies chest pain, shortness of breath, concerning symptoms for hypertensive urgency or emergency.  Discussed with patient to continue her medications at home.  We will give her Phenergan suppositories and have her monitor blood pressure at home.  Patient remained clinically stable throughout the Emergency Department visit.  Vitals were unremarkable and the patient's condition required no further ED intervention.  The patient is stable for outpatient management with appropriate symptomatic treatment, return precautions, and was advised of the importance of outpatient follow up for ongoing care    Medications   cloNIDine (CATAPRES) 0.1 mg/24 hr patch 1 Patch (1 Patch TransDERmal Apply Patch 03/22/18 2106)   lisinopril (PRINIVIL, ZESTRIL) tablet 20 mg (has no administration in time range)   sodium chloride (NS) flush 5-10 mL (10 mL IntraVENous Given 03/22/18 2106)   haloperidol lactate (HALDOL) injection 2.5 mg (2.5 mg IntraVENous Given 03/22/18 2025)   diphenhydrAMINE (BENADRYL) injection 25 mg (25 mg IntraVENous Given 03/22/18 2025)    LORazepam (ATIVAN) injection 1 mg (1 mg IntraVENous Given 03/22/18 2105)   metoclopramide HCl (REGLAN) injection 10 mg (10 mg IntraVENous Given 03/22/18 2027)     Final Diagnosis       ICD-10-CM ICD-9-CM   1. History of diabetic gastroparesis Z86.39 V12.29   2. Hypertension, unspecified type I10 401.9   3. Non-intractable vomiting with nausea, unspecified vomiting type R11.2 787.01     Disposition     home    Current Discharge Medication List      START taking these medications    Details   !! promethazine (PHENERGAN) 12.5 mg tablet Take 1 Tab by mouth every six (6) hours as needed for Nausea.  Qty: 15 Tab, Refills: 0      promethazine (PHENERGAN) 25 mg suppository Insert 1 Suppository into rectum every six (6) hours as needed for Nausea for up to 7 days.  Qty: 12 Suppository, Refills: 0       !! - Potential duplicate medications found. Please discuss with provider.      CONTINUE these medications which have NOT CHANGED    Details   !! promethazine (PHENERGAN) 12.5 mg tablet Take 1 Tab by mouth every six (6) hours as needed for Nausea.  Qty: 30 Tab, Refills: 0       !! - Potential duplicate medications found. Please discuss with provider.        Patient was discharged home in stable condition with discharge instructions on the same.     Return to the ER if condition worsens or new symptoms develop.   Follow up with GI as discussed.     The patient was personally evaluated by myself and  KISA, Pete Pelt, MD who agrees with the above assessment and plan.    Clerance Lav, PA-C  March 22, 2018      *Portions of this electronic record were dictated using Dragon voice recognition  software.  Unintended errors in translation may occur.    My signature above authenticates this document and my orders, the final ??  diagnosis (es), discharge prescription (s), and instructions in the Epic ??  record.  If you have any questions please contact [phone].  ??   Nursing notes have been reviewed by the physician/ advanced practice ??  Clinician.

## 2018-03-22 NOTE — ED Triage Notes (Signed)
Brought in by EMS for abdominal pain and nausea

## 2018-03-23 ENCOUNTER — Inpatient Hospital Stay
Admit: 2018-03-23 | Discharge: 2018-03-23 | Disposition: A | Discharge status: Home / Self Care | Payer: MEDICARE | Attending: Emergency Medicine

## 2018-03-23 LAB — COMPREHENSIVE METABOLIC PANEL
ALT: 24 U/L (ref 12–78)
AST: 21 U/L (ref 15–37)
Albumin: 4 gm/dl (ref 3.4–5.0)
Alkaline Phosphatase: 94 U/L (ref 45–117)
Anion Gap: 8 mmol/L (ref 5–15)
BUN: 10 mg/dl (ref 7–25)
CO2: 25 mEq/L (ref 21–32)
Calcium: 9.5 mg/dl (ref 8.5–10.1)
Chloride: 104 mEq/L (ref 98–107)
Creatinine: 1 mg/dl (ref 0.6–1.3)
EGFR IF NonAfrican American: >60
GFR African American: >60
Glucose: 116 mg/dl — ABNORMAL HIGH (ref 74–106)
Potassium: 3.5 mEq/L (ref 3.5–5.1)
Sodium: 136 mEq/L (ref 136–145)
Total Bilirubin: 0.4 mg/dl (ref 0.2–1.0)
Total Protein: 8.2 gm/dl (ref 6.4–8.2)

## 2018-03-23 LAB — CBC WITH AUTO DIFFERENTIAL
Basophils %: 0.4 % (ref 0–3)
Eosinophils %: 0 % (ref 0–5)
Hematocrit: 35.3 % — ABNORMAL LOW (ref 37.0–50.0)
Hemoglobin: 11.5 gm/dl — ABNORMAL LOW (ref 13.0–17.2)
Immature Granulocytes: 0.4 % (ref 0.0–3.0)
Lymphocytes %: 14.2 % — ABNORMAL LOW (ref 28–48)
MCH: 26.4 pg (ref 25.4–34.6)
MCHC: 32.6 gm/dl (ref 30.0–36.0)
MCV: 81.1 fL (ref 80.0–98.0)
MPV: 13.3 fL — ABNORMAL HIGH (ref 6.0–10.0)
Monocytes %: 4 % (ref 1–13)
Neutrophils %: 81 % — ABNORMAL HIGH (ref 34–64)
Nucleated RBCs: 0 (ref 0–0)
Platelets: 250 10*3/uL (ref 140–450)
RBC: 4.35 M/uL (ref 3.60–5.20)
RDW-SD: 44 (ref 36.4–46.3)
WBC: 16.1 10*3/uL — ABNORMAL HIGH (ref 4.0–11.0)

## 2018-03-23 LAB — LIPASE
Lipase: 84 U/L (ref 73–393)
Lipase: 84 U/L (ref 73–393)

## 2018-03-23 LAB — METABOLIC PANEL, COMPREHENSIVE
ALT (SGPT): 24 U/L (ref 12–78)
AST (SGOT): 21 U/L (ref 15–37)
Albumin: 4 gm/dl (ref 3.4–5.0)
Alk. phosphatase: 94 U/L (ref 45–117)
Anion gap: 8 mmol/L (ref 5–15)
BUN: 10 mg/dl (ref 7–25)
Bilirubin, total: 0.4 mg/dl (ref 0.2–1.0)
CO2: 25 mEq/L (ref 21–32)
Calcium: 9.5 mg/dl (ref 8.5–10.1)
Chloride: 104 mEq/L (ref 98–107)
Creatinine: 1 mg/dl (ref 0.6–1.3)
GFR est AA: >60
GFR est non-AA: >60
Glucose: 116 mg/dl — ABNORMAL HIGH (ref 74–106)
Potassium: 3.5 mEq/L (ref 3.5–5.1)
Protein, total: 8.2 gm/dl (ref 6.4–8.2)
Sodium: 136 mEq/L (ref 136–145)

## 2018-03-23 LAB — CBC WITH AUTOMATED DIFF
BASOPHILS: 0.4 % (ref 0–3)
EOSINOPHILS: 0 % (ref 0–5)
HCT: 35.3 % — ABNORMAL LOW (ref 37.0–50.0)
HGB: 11.5 gm/dl — ABNORMAL LOW (ref 13.0–17.2)
IMMATURE GRANULOCYTES: 0.4 % (ref 0.0–3.0)
LYMPHOCYTES: 14.2 % — ABNORMAL LOW (ref 28–48)
MCH: 26.4 pg (ref 25.4–34.6)
MCHC: 32.6 gm/dl (ref 30.0–36.0)
MCV: 81.1 fL (ref 80.0–98.0)
MONOCYTES: 4 % (ref 1–13)
MPV: 13.3 fL — ABNORMAL HIGH (ref 6.0–10.0)
NEUTROPHILS: 81 % — ABNORMAL HIGH (ref 34–64)
NRBC: 0 (ref 0–0)
PLATELET: 250 10*3/uL (ref 140–450)
RBC: 4.35 M/uL (ref 3.60–5.20)
RDW-SD: 44 (ref 36.4–46.3)
WBC: 16.1 10*3/uL — ABNORMAL HIGH (ref 4.0–11.0)

## 2018-03-23 MED ORDER — PROMETHAZINE 25 MG RECTAL SUPPOSITORY
25 mg | Freq: Four times a day (QID) | RECTAL | 0 refills | Status: AC | PRN
Start: 2018-03-23 — End: 2018-03-29

## 2018-03-23 MED ORDER — DIPHENHYDRAMINE HCL 50 MG/ML IJ SOLN
50 mg/mL | INTRAMUSCULAR | Status: AC
Start: 2018-03-23 — End: 2018-03-22
  Administered 2018-03-23: 01:00:00 via INTRAVENOUS

## 2018-03-23 MED ORDER — METOCLOPRAMIDE 5 MG/ML IJ SOLN
5 mg/mL | INTRAMUSCULAR | Status: AC
Start: 2018-03-23 — End: 2018-03-22
  Administered 2018-03-23: 01:00:00 via INTRAVENOUS

## 2018-03-23 MED ORDER — SODIUM CHLORIDE 0.9 % IJ SYRG
Freq: Once | INTRAMUSCULAR | Status: AC
Start: 2018-03-23 — End: 2018-03-22
  Administered 2018-03-23: 02:00:00 via INTRAVENOUS

## 2018-03-23 MED ORDER — HALOPERIDOL LACTATE 5 MG/ML IJ SOLN
5 mg/mL | INTRAMUSCULAR | Status: AC
Start: 2018-03-23 — End: 2018-03-22
  Administered 2018-03-23: 01:00:00 via INTRAVENOUS

## 2018-03-23 MED ORDER — PROMETHAZINE 12.5 MG TAB
12.5 mg | ORAL_TABLET | Freq: Four times a day (QID) | ORAL | 0 refills | Status: AC | PRN
Start: 2018-03-23 — End: ?

## 2018-03-23 MED ORDER — CLONIDINE 0.1 MG/24 HR WEEKLY TRANSDERM PATCH
0.1 mg/24 hr | TRANSDERMAL | Status: DC
Start: 2018-03-23 — End: 2018-03-23

## 2018-03-23 MED ORDER — LORAZEPAM 2 MG/ML IJ SOLN
2 mg/mL | Freq: Once | INTRAMUSCULAR | Status: AC
Start: 2018-03-23 — End: 2018-03-22
  Administered 2018-03-23: 02:00:00 via INTRAVENOUS

## 2018-03-23 MED ORDER — ONDANSETRON (PF) 4 MG/2 ML INJECTION
4 mg/2 mL | Freq: Once | INTRAMUSCULAR | Status: DC
Start: 2018-03-23 — End: 2018-03-22

## 2018-03-23 MED ORDER — LISINOPRIL 20 MG TAB
20 mg | ORAL | Status: AC
Start: 2018-03-23 — End: 2018-03-22
  Administered 2018-03-23: 04:00:00 via ORAL

## 2018-03-23 MED FILL — CLONIDINE 0.1 MG/24 HR WEEKLY TRANSDERM PATCH: 0.1 mg/24 hr | TRANSDERMAL | Qty: 1

## 2018-03-23 MED FILL — METOCLOPRAMIDE 5 MG/ML IJ SOLN: 5 mg/mL | INTRAMUSCULAR | Qty: 2

## 2018-03-23 MED FILL — LISINOPRIL 20 MG TAB: 20 mg | ORAL | Qty: 1

## 2018-03-23 MED FILL — DIPHENHYDRAMINE HCL 50 MG/ML IJ SOLN: 50 mg/mL | INTRAMUSCULAR | Qty: 1

## 2018-03-23 MED FILL — BD POSIFLUSH NORMAL SALINE 0.9 % INJECTION SYRINGE: INTRAMUSCULAR | Qty: 10

## 2018-03-23 MED FILL — LORAZEPAM 2 MG/ML IJ SOLN: 2 mg/mL | INTRAMUSCULAR | Qty: 1

## 2018-03-23 MED FILL — ONDANSETRON (PF) 4 MG/2 ML INJECTION: 4 mg/2 mL | INTRAMUSCULAR | Qty: 2

## 2018-03-23 MED FILL — HALOPERIDOL LACTATE 5 MG/ML IJ SOLN: 5 mg/mL | INTRAMUSCULAR | Qty: 1

## 2019-03-04 ENCOUNTER — Encounter: Payer: Self-pay | Admitting: Internal Medicine

## 2019-04-04 ENCOUNTER — Other Ambulatory Visit: Payer: Self-pay

## 2019-04-04 ENCOUNTER — Encounter (HOSPITAL_COMMUNITY): Payer: Self-pay

## 2019-04-04 ENCOUNTER — Emergency Department (HOSPITAL_COMMUNITY)
Admission: EM | Admit: 2019-04-04 | Discharge: 2019-04-04 | Disposition: A | Discharge status: Home / Self Care | Payer: Medicare Other | Attending: Emergency Medicine | Admitting: Emergency Medicine

## 2019-04-04 DIAGNOSIS — Z3492 Encounter for supervision of normal pregnancy, unspecified, second trimester: Secondary | ICD-10-CM

## 2019-04-04 DIAGNOSIS — Z3A24 24 weeks gestation of pregnancy: Secondary | ICD-10-CM | POA: Insufficient documentation

## 2019-04-04 DIAGNOSIS — O24419 Gestational diabetes mellitus in pregnancy, unspecified control: Secondary | ICD-10-CM | POA: Diagnosis not present

## 2019-04-04 DIAGNOSIS — T383X5A Adverse effect of insulin and oral hypoglycemic [antidiabetic] drugs, initial encounter: Secondary | ICD-10-CM

## 2019-04-04 DIAGNOSIS — I10 Essential (primary) hypertension: Secondary | ICD-10-CM | POA: Insufficient documentation

## 2019-04-04 DIAGNOSIS — E160 Drug-induced hypoglycemia without coma: Secondary | ICD-10-CM

## 2019-04-04 DIAGNOSIS — K120 Recurrent oral aphthae: Secondary | ICD-10-CM

## 2019-04-04 HISTORY — DX: Disorder of kidney and ureter, unspecified: N28.9

## 2019-04-04 HISTORY — DX: Disorder of kidney and ureter, unspecified: O26.839

## 2019-04-04 HISTORY — DX: Type 2 diabetes mellitus without complications: E11.9

## 2019-04-04 HISTORY — DX: Essential (primary) hypertension: I10

## 2019-04-04 HISTORY — DX: Gastroparesis: K31.84

## 2019-04-04 LAB — CBG MONITORING, ED
Glucose-Capillary: 55 mg/dL — ABNORMAL LOW (ref 70–99)
Glucose-Capillary: 78 mg/dL (ref 70–99)
Glucose-Capillary: 166 mg/dL — ABNORMAL HIGH (ref 70–99)
Glucose-Capillary: 114 mg/dL — ABNORMAL HIGH (ref 70–99)

## 2019-04-04 NOTE — ED Notes (Addendum)
Pt was given apple juice, graham crackers, and a [place] sandwich. Pt has eaten half of the sandwich and everything else given to her. CBG is now 78. Patient is alert and oriented x 4 and has no complaints at this time

## 2019-04-04 NOTE — ED Notes (Signed)
Pt given apple juice due to blood sugar 55

## 2019-04-04 NOTE — Discharge Instructions (Signed)
It is very important that you eat regularly to prevent low blood sugar. Low blood sugar harms your baby.  Follow up with you obstetrician and your endocrinologist.

## 2019-04-04 NOTE — Progress Notes (Signed)
OB RR RN called to assess [redacted]w[redacted]d G2P1 presenting to ED with low blood sugar.  FHR 120 by doppler, pt reports good fetal movement earlier in the day but not as of recent tonight. Denies bleeding, lof, contractions, and discharge.  Pt reports that she has a prenatal visit today with her provider at [facility].

## 2019-04-04 NOTE — ED Notes (Signed)
Pt was assessed by [facility] nurse Baxter Hire. RN states the fetal heart rate was 120 and the OB assessment was WDL.

## 2019-04-04 NOTE — ED Provider Notes (Signed)
[facility] EMERGENCY DEPARTMENT Provider Note   CSN: [id number] Arrival date & time: 04/04/19  0326   History Chief Complaint: Hypoglycemia  Tamara Lane is a 34 y.o. female.  The history is provided by the patient.  She has history of hypertension, diabetes, renal insufficiency and is currently pregnant at [redacted] weeks and comes in because of low blood sugar.  She states that she had low blood sugar yesterday and again tonight.  EMS arrived and noted glucose of 45 treated with glucagon and then D10.  CBG improved to 55.  She states that she was not eating because her tongue is sore.  Past Medical History:  Diagnosis Date  . Diabetes mellitus without complication (HCC)   . Gastroparesis   . Hypertension   . Renal insufficiency affecting pregnancy     There are no problems to display for this patient.   Past Surgical History:  Procedure Laterality Date  . CESAREAN SECTION    . GASTRIC STIMULATOR IMPLANT SURGERY  2016     OB History    Gravida  2   Para  1   Term      Preterm  1   AB      Living  1     SAB      TAB      Ectopic      Multiple      Live Births  1           No family history on file.  Social History   Tobacco Use  . Smoking status: Never Smoker  . Smokeless tobacco: Never Used  Substance Use Topics  . Alcohol use: Not Currently  . Drug use: Not Currently    Home Medications Prior to Admission medications   Not on File    Allergies    Patient has no allergy information on record.  Review of Systems   Review of Systems  All other systems reviewed and are negative.   Physical Exam Updated Vital Signs Pulse 91   Temp (!) 97.5 F (36.4 C) (Oral)   Resp (!) 21   Ht 5\' 6"  (1.676 m)   Wt 61.7 kg   SpO2 100%   BMI 21.95 kg/m   Physical Exam Vitals and nursing note reviewed.   34 year old female, resting comfortably and in no acute distress. Vital signs are normal. Oxygen saturation is 100%, which is  normal. Head is normocephalic and atraumatic. PERRLA, EOMI. tongue has several aphthous ulcers present. Neck is nontender and supple without adenopathy or JVD. Back is nontender and there is no CVA tenderness. Lungs are clear without rales, wheezes, or rhonchi. Chest is nontender. Heart has regular rate and rhythm without murmur. Abdomen has gravid uterus present with size consistent with dates.  No abdominal tenderness present.  Peristalsis is present. Extremities have no cyanosis or edema, full range of motion is present. Skin is warm and dry without rash. Neurologic: Mental status is normal, cranial nerves are intact, there are no motor or sensory deficits.  ED Results / Procedures / Treatments   Labs (all labs ordered are listed, but only abnormal results are displayed) Labs Reviewed  CBG MONITORING, ED - Abnormal; Notable for the following components:      Result Value   Glucose-Capillary 55 (*)    All other components within normal limits  CBG MONITORING, ED - Abnormal; Notable for the following components:   Glucose-Capillary 166 (*)    All other components  within normal limits  CBG MONITORING, ED - Abnormal; Notable for the following components:   Glucose-Capillary 114 (*)    All other components within normal limits  CBG MONITORING, ED    Procedures Procedures   Medications Ordered in ED Medications - No data to display  ED Course  I have reviewed the triage vital signs and the nursing notes.  Pertinent lab results that were available during my care of the patient were reviewed by me and considered in my medical decision making (see chart for details).  MDM Rules/Calculators/A&P Hypoglycemia.  Second trimester pregnancy.  She is given juice and a sandwich.  Glucose has increased to 78.  Will observe in the ED to make sure her glucose is stable.  She will need to work with her endocrinologist and obstetrician regarding appropriate medications for her diabetes.  Old  records are reviewed confirming management of pregnancy through [facility].  Glucose has risen to over 100 and has been stable over several hours.  She is felt to be safe for discharge.  She states she is scheduled to see her endocrinologist in the next several weeks to be placed on an insulin pump.  In the meantime, she is encouraged to make sure she does not miss meals to prevent hypoglycemia.  Final Clinical Impression(s) / ED Diagnoses Final diagnoses:  Hypoglycemia due to insulin  Second trimester pregnancy  Aphthous ulcer of tongue    Rx / DC Orders ED Discharge Orders    None       Delora Fuel, MD 74/12/87 [phone]

## 2019-04-04 NOTE — ED Triage Notes (Signed)
Patient arrived from home with a chief complaint of hypoglycemia. The patient's family called EMS stated her blood sugar dropped and she was unresponsive for the second time today. EMS reports an initial CBG of 45. 1 mg of glucagon was administered and 10 grams of D10 was administered and she pulled put her IV. Pt is alert and oriented x 3, CBG 55.

## 2020-01-13 ENCOUNTER — Emergency Department (HOSPITAL_COMMUNITY): Payer: Medicare Other

## 2020-01-13 ENCOUNTER — Encounter (HOSPITAL_COMMUNITY): Payer: Self-pay | Admitting: Pharmacy Technician

## 2020-01-13 ENCOUNTER — Inpatient Hospital Stay (HOSPITAL_COMMUNITY)
Admission: EM | Admit: 2020-01-13 | Discharge: 2020-01-16 | DRG: 690 | Disposition: A | Discharge status: Home / Self Care | Payer: Medicare Other | Attending: Internal Medicine | Admitting: Internal Medicine

## 2020-01-13 DIAGNOSIS — N3090 Cystitis, unspecified without hematuria: Secondary | ICD-10-CM | POA: Diagnosis not present

## 2020-01-13 DIAGNOSIS — N12 Tubulo-interstitial nephritis, not specified as acute or chronic: Secondary | ICD-10-CM | POA: Diagnosis present

## 2020-01-13 DIAGNOSIS — I10 Essential (primary) hypertension: Secondary | ICD-10-CM | POA: Diagnosis present

## 2020-01-13 DIAGNOSIS — Z20822 Contact with and (suspected) exposure to covid-19: Secondary | ICD-10-CM | POA: Diagnosis present

## 2020-01-13 DIAGNOSIS — B9620 Unspecified Escherichia coli [E. coli] as the cause of diseases classified elsewhere: Secondary | ICD-10-CM | POA: Diagnosis present

## 2020-01-13 DIAGNOSIS — Z888 Allergy status to other drugs, medicaments and biological substances status: Secondary | ICD-10-CM

## 2020-01-13 DIAGNOSIS — N189 Chronic kidney disease, unspecified: Secondary | ICD-10-CM | POA: Diagnosis present

## 2020-01-13 DIAGNOSIS — Z794 Long term (current) use of insulin: Secondary | ICD-10-CM

## 2020-01-13 DIAGNOSIS — K3184 Gastroparesis: Secondary | ICD-10-CM | POA: Diagnosis present

## 2020-01-13 DIAGNOSIS — N10 Acute tubulo-interstitial nephritis: Secondary | ICD-10-CM

## 2020-01-13 DIAGNOSIS — Z881 Allergy status to other antibiotic agents status: Secondary | ICD-10-CM

## 2020-01-13 DIAGNOSIS — N179 Acute kidney failure, unspecified: Secondary | ICD-10-CM | POA: Diagnosis present

## 2020-01-13 DIAGNOSIS — N136 Pyonephrosis: Principal | ICD-10-CM | POA: Diagnosis present

## 2020-01-13 DIAGNOSIS — Z9641 Presence of insulin pump (external) (internal): Secondary | ICD-10-CM | POA: Diagnosis present

## 2020-01-13 DIAGNOSIS — E1065 Type 1 diabetes mellitus with hyperglycemia: Secondary | ICD-10-CM | POA: Diagnosis present

## 2020-01-13 DIAGNOSIS — E1143 Type 2 diabetes mellitus with diabetic autonomic (poly)neuropathy: Secondary | ICD-10-CM | POA: Diagnosis present

## 2020-01-13 DIAGNOSIS — E860 Dehydration: Secondary | ICD-10-CM | POA: Diagnosis present

## 2020-01-13 DIAGNOSIS — Z880 Allergy status to penicillin: Secondary | ICD-10-CM

## 2020-01-13 DIAGNOSIS — I129 Hypertensive chronic kidney disease with stage 1 through stage 4 chronic kidney disease, or unspecified chronic kidney disease: Secondary | ICD-10-CM | POA: Diagnosis present

## 2020-01-13 DIAGNOSIS — E1022 Type 1 diabetes mellitus with diabetic chronic kidney disease: Secondary | ICD-10-CM | POA: Diagnosis present

## 2020-01-13 DIAGNOSIS — F1210 Cannabis abuse, uncomplicated: Secondary | ICD-10-CM

## 2020-01-13 DIAGNOSIS — E1043 Type 1 diabetes mellitus with diabetic autonomic (poly)neuropathy: Secondary | ICD-10-CM | POA: Diagnosis present

## 2020-01-13 DIAGNOSIS — R112 Nausea with vomiting, unspecified: Secondary | ICD-10-CM

## 2020-01-13 HISTORY — DX: Cannabis abuse, uncomplicated: F12.10

## 2020-01-13 LAB — HEMOGLOBIN A1C
Hgb A1c MFr Bld: 8.2 % — ABNORMAL HIGH (ref 4.8–5.6)
Mean Plasma Glucose: 188.64 mg/dL

## 2020-01-13 LAB — CBC
HCT: 36.3 % (ref 36.0–46.0)
Hemoglobin: 11.9 g/dL — ABNORMAL LOW (ref 12.0–15.0)
MCH: 26.9 pg (ref 26.0–34.0)
MCHC: 32.8 g/dL (ref 30.0–36.0)
MCV: 81.9 fL (ref 80.0–100.0)
Platelets: 258 10*3/uL (ref 150–400)
RBC: 4.43 MIL/uL (ref 3.87–5.11)
RDW: 14.9 % (ref 11.5–15.5)
WBC: 12.3 10*3/uL — ABNORMAL HIGH (ref 4.0–10.5)
nRBC: 0 % (ref 0.0–0.2)

## 2020-01-13 LAB — RESP PANEL BY RT-PCR (FLU A&B, COVID) ARPGX2
Influenza A by PCR: NEGATIVE
Influenza B by PCR: NEGATIVE
SARS Coronavirus 2 by RT PCR: NEGATIVE

## 2020-01-13 LAB — URINALYSIS, ROUTINE W REFLEX MICROSCOPIC
Bilirubin Urine: NEGATIVE
Glucose, UA: >=500 mg/dL — AB
Ketones, ur: 20 mg/dL — AB
Nitrite: POSITIVE — AB
Protein, ur: >=300 mg/dL — AB
Specific Gravity, Urine: 1.023 (ref 1.005–1.030)
WBC, UA: >50 WBC/hpf — ABNORMAL HIGH (ref 0–5)
pH: 7 (ref 5.0–8.0)

## 2020-01-13 LAB — I-STAT BETA HCG BLOOD, ED (MC, WL, AP ONLY): I-stat hCG, quantitative: <5 m[IU]/mL (ref <5)

## 2020-01-13 LAB — COMPREHENSIVE METABOLIC PANEL
ALT: 13 U/L (ref 0–44)
AST: 18 U/L (ref 15–41)
Albumin: 3.8 g/dL (ref 3.5–5.0)
Alkaline Phosphatase: 88 U/L (ref 38–126)
Anion gap: 14 (ref 5–15)
BUN: 15 mg/dL (ref 6–20)
CO2: 20 mmol/L — ABNORMAL LOW (ref 22–32)
Calcium: 8.9 mg/dL (ref 8.9–10.3)
Chloride: 100 mmol/L (ref 98–111)
Creatinine, Ser: 1.21 mg/dL — ABNORMAL HIGH (ref 0.44–1.00)
GFR, Estimated: >60 mL/min (ref >60)
Glucose, Bld: 304 mg/dL — ABNORMAL HIGH (ref 70–99)
Potassium: 3.7 mmol/L (ref 3.5–5.1)
Sodium: 134 mmol/L — ABNORMAL LOW (ref 135–145)
Total Bilirubin: 0.7 mg/dL (ref 0.3–1.2)
Total Protein: 7.1 g/dL (ref 6.5–8.1)

## 2020-01-13 LAB — GLUCOSE, CAPILLARY: Glucose-Capillary: 29 mg/dL — CL (ref 70–99)

## 2020-01-13 LAB — TSH: TSH: 0.568 u[IU]/mL (ref 0.350–4.500)

## 2020-01-13 LAB — CBG MONITORING, ED
Glucose-Capillary: 258 mg/dL — ABNORMAL HIGH (ref 70–99)
Glucose-Capillary: 270 mg/dL — ABNORMAL HIGH (ref 70–99)

## 2020-01-13 LAB — LIPASE, BLOOD: Lipase: 22 U/L (ref 11–51)

## 2020-01-13 LAB — HIV ANTIBODY (ROUTINE TESTING W REFLEX): HIV Screen 4th Generation wRfx: NONREACTIVE

## 2020-01-13 MED ORDER — HYDROMORPHONE HCL 1 MG/ML IJ SOLN
0.5000 mg | Freq: Once | INTRAMUSCULAR | Status: AC
  Administered 2020-01-13: 0.5 mg via INTRAVENOUS
  Filled 2020-01-13: qty 1

## 2020-01-13 MED ORDER — INSULIN ASPART 100 UNIT/ML ~~LOC~~ SOLN
3.0000 [IU] | Freq: Three times a day (TID) | SUBCUTANEOUS | Status: DC
  Administered 2020-01-13 – 2020-01-14 (×2): 3 [IU] via SUBCUTANEOUS

## 2020-01-13 MED ORDER — POLYETHYLENE GLYCOL 3350 17 G PO PACK: 17.0000 g | PACK | Freq: Every day | ORAL | Status: DC | PRN

## 2020-01-13 MED ORDER — DEXTROSE 5 % IV SOLN: INTRAVENOUS | Status: DC

## 2020-01-13 MED ORDER — HYDROMORPHONE HCL 1 MG/ML IJ SOLN
1.0000 mg | Freq: Once | INTRAMUSCULAR | Status: AC
  Administered 2020-01-13: 1 mg via INTRAVENOUS
  Filled 2020-01-13: qty 1

## 2020-01-13 MED ORDER — SODIUM CHLORIDE 0.9 % IV SOLN
1.0000 g | INTRAVENOUS | Status: DC
  Administered 2020-01-14: 1 g via INTRAVENOUS
  Filled 2020-01-13: qty 10
  Filled 2020-01-13: qty 1

## 2020-01-13 MED ORDER — HYDRALAZINE HCL 20 MG/ML IJ SOLN: 5.0000 mg | INTRAMUSCULAR | Status: DC | PRN

## 2020-01-13 MED ORDER — LACTATED RINGERS IV SOLN: INTRAVENOUS | Status: DC

## 2020-01-13 MED ORDER — MORPHINE SULFATE (PF) 2 MG/ML IV SOLN
2.0000 mg | INTRAVENOUS | Status: DC | PRN
  Administered 2020-01-14 – 2020-01-16 (×11): 2 mg via INTRAVENOUS
  Filled 2020-01-13 (×11): qty 1

## 2020-01-13 MED ORDER — SODIUM CHLORIDE 0.9 % IV SOLN: INTRAVENOUS | Status: DC

## 2020-01-13 MED ORDER — SODIUM CHLORIDE 0.9 % IV BOLUS
1000.0000 mL | Freq: Once | INTRAVENOUS | Status: AC
  Administered 2020-01-13: 1000 mL via INTRAVENOUS

## 2020-01-13 MED ORDER — IOHEXOL 300 MG/ML  SOLN
100.0000 mL | Freq: Once | INTRAMUSCULAR | Status: AC | PRN
  Administered 2020-01-13: 100 mL via INTRAVENOUS

## 2020-01-13 MED ORDER — INSULIN GLARGINE 100 UNIT/ML ~~LOC~~ SOLN
10.0000 [IU] | Freq: Once | SUBCUTANEOUS | Status: AC
  Administered 2020-01-13: 10 [IU] via SUBCUTANEOUS
  Filled 2020-01-13: qty 0.1

## 2020-01-13 MED ORDER — ONDANSETRON 4 MG PO TBDP
4.0000 mg | ORAL_TABLET | Freq: Once | ORAL | Status: AC | PRN
  Administered 2020-01-13: 4 mg via ORAL
  Filled 2020-01-13: qty 1

## 2020-01-13 MED ORDER — LACTATED RINGERS IV BOLUS: 1000.0000 mL | Freq: Once | INTRAVENOUS | Status: DC

## 2020-01-13 MED ORDER — INSULIN ASPART 100 UNIT/ML ~~LOC~~ SOLN: 0.0000 [IU] | Freq: Every day | SUBCUTANEOUS | Status: DC

## 2020-01-13 MED ORDER — LABETALOL HCL 100 MG PO TABS
600.0000 mg | ORAL_TABLET | Freq: Three times a day (TID) | ORAL | Status: DC
  Administered 2020-01-13 – 2020-01-16 (×9): 600 mg via ORAL
  Filled 2020-01-13: qty 6
  Filled 2020-01-13: qty 3
  Filled 2020-01-13 (×7): qty 6

## 2020-01-13 MED ORDER — LABETALOL HCL 5 MG/ML IV SOLN
20.0000 mg | Freq: Once | INTRAVENOUS | Status: AC
  Administered 2020-01-13: 20 mg via INTRAVENOUS
  Filled 2020-01-13: qty 4

## 2020-01-13 MED ORDER — ONDANSETRON HCL 4 MG/2ML IJ SOLN
4.0000 mg | Freq: Four times a day (QID) | INTRAMUSCULAR | Status: DC | PRN
  Administered 2020-01-14 (×2): 4 mg via INTRAVENOUS
  Filled 2020-01-13 (×2): qty 2

## 2020-01-13 MED ORDER — BISACODYL 5 MG PO TBEC: 5.0000 mg | DELAYED_RELEASE_TABLET | Freq: Every day | ORAL | Status: DC | PRN

## 2020-01-13 MED ORDER — INSULIN ASPART 100 UNIT/ML ~~LOC~~ SOLN
0.0000 [IU] | Freq: Three times a day (TID) | SUBCUTANEOUS | Status: DC
  Administered 2020-01-13: 5 [IU] via SUBCUTANEOUS
  Administered 2020-01-14: 7 [IU] via SUBCUTANEOUS

## 2020-01-13 MED ORDER — ONDANSETRON HCL 4 MG/2ML IJ SOLN
4.0000 mg | Freq: Once | INTRAMUSCULAR | Status: AC
  Administered 2020-01-13: 4 mg via INTRAVENOUS
  Filled 2020-01-13: qty 2

## 2020-01-13 MED ORDER — ONDANSETRON HCL 4 MG PO TABS: 4.0000 mg | ORAL_TABLET | Freq: Four times a day (QID) | ORAL | Status: DC | PRN

## 2020-01-13 MED ORDER — HYDROCODONE-ACETAMINOPHEN 5-325 MG PO TABS
1.0000 | ORAL_TABLET | ORAL | Status: DC | PRN
  Administered 2020-01-14 (×2): 2 via ORAL
  Filled 2020-01-13 (×3): qty 2

## 2020-01-13 MED ORDER — SODIUM CHLORIDE 0.9 % IV SOLN
1.0000 g | Freq: Once | INTRAVENOUS | Status: AC
  Administered 2020-01-13: 1 g via INTRAVENOUS
  Filled 2020-01-13: qty 10

## 2020-01-13 MED ORDER — ACETAMINOPHEN 650 MG RE SUPP: 650.0000 mg | Freq: Four times a day (QID) | RECTAL | Status: DC | PRN

## 2020-01-13 MED ORDER — DOCUSATE SODIUM 100 MG PO CAPS
100.0000 mg | ORAL_CAPSULE | Freq: Two times a day (BID) | ORAL | Status: DC
  Administered 2020-01-14 – 2020-01-16 (×5): 100 mg via ORAL
  Filled 2020-01-13 (×5): qty 1

## 2020-01-13 MED ORDER — ENOXAPARIN SODIUM 40 MG/0.4ML ~~LOC~~ SOLN
40.0000 mg | SUBCUTANEOUS | Status: DC
  Administered 2020-01-13: 40 mg via SUBCUTANEOUS
  Filled 2020-01-13 (×4): qty 0.4

## 2020-01-13 MED ORDER — CAPSAICIN 0.025 % EX CREA
TOPICAL_CREAM | Freq: Two times a day (BID) | CUTANEOUS | Status: DC
  Filled 2020-01-13 (×2): qty 60

## 2020-01-13 MED ORDER — DIPHENHYDRAMINE HCL 50 MG/ML IJ SOLN
12.5000 mg | Freq: Once | INTRAMUSCULAR | Status: AC
  Administered 2020-01-13: 12.5 mg via INTRAVENOUS
  Filled 2020-01-13: qty 1

## 2020-01-13 MED ORDER — METOCLOPRAMIDE HCL 5 MG/ML IJ SOLN: 10.0000 mg | Freq: Four times a day (QID) | INTRAMUSCULAR | Status: DC | PRN

## 2020-01-13 MED ORDER — ACETAMINOPHEN 325 MG PO TABS: 650.0000 mg | ORAL_TABLET | Freq: Four times a day (QID) | ORAL | Status: DC | PRN

## 2020-01-13 NOTE — Progress Notes (Signed)
Hypoglycemic Event  CBG: 29  Treatment:4 oz apple juice  Symptoms: none  Follow-up CBG: Time:1201 CBG Result:51  Possible Reasons for Event:  Comments/MD notified: [place],NP, Text paged     Khristin Keleher A Sebert Stollings

## 2020-01-13 NOTE — H&P (Signed)
History and Physical    Verley Pariseau DUK:[id number] DOB: 22-Aug-1985 DOA: 01/13/2020  PCP: Patient, No Pcp Per Consultants:  Lily Peer - surgery; [facility] - endocrinology Patient coming from:  Home - lives with daughter and niece; NOK: Elizebeth Brooking, [phone]  Chief Complaint: N/V  HPI: Jillien Yakel is a 34 y.o. female with medical history significant of DM with gastroparesis s/p gastric stimulator that was removed in June; HTN; and CKD presenting with n/v.  She started with a headache, trying to sleep it off.  She woke up and vomited and then her headache worsened and stomach felt like it was spasming.  She had polyuria, no dysuria but suprapubic pain.  No flank pain.  No fever, did have chills.  Pump is not on currently, uses Omnipod and left it at home.  Sugars have been generally in 200 range.  She suffered fetal demise at 7 months last year and also recently lost her mom.  Denies depression.    ED Course:   DM - on pump but off (left phone at home and it won't work without phone).  Came in with pyelo - flank pain, nausea.  Feels different than usual gastroparesis.  Given Rocephin, IVF, pain meds.  Feels yucky, not tolerating PO but not vomiting.  Likely needs overnight obs.  Review of Systems: As per HPI; otherwise review of systems reviewed and negative.   Ambulatory Status:  Ambulates without assistance  COVID Vaccine Status:   Complete  Past Medical History:  Diagnosis Date  . Diabetes mellitus without complication (HCC)   . Gastroparesis   . Hypertension   . Marijuana abuse 01/13/2020  . Renal insufficiency affecting pregnancy     Past Surgical History:  Procedure Laterality Date  . CESAREAN SECTION    . GASTRIC STIMULATOR IMPLANT SURGERY  2016    Social History   Socioeconomic History  . Marital status: Single    Spouse name: Not on file  . Number of children: Not on file  . Years of education: Not on file  . Highest education level: Not on file   Occupational History  . Occupation: server/bartender  Tobacco Use  . Smoking status: Never Smoker  . Smokeless tobacco: Never Used  Substance and Sexual Activity  . Alcohol use: Not Currently  . Drug use: Yes    Types: Marijuana    Comment: intermittent use  . Sexual activity: Not on file  Other Topics Concern  . Not on file  Social History Narrative  . Not on file   Social Determinants of Health   Financial Resource Strain: Not on file  Food Insecurity: Not on file  Transportation Needs: Not on file  Physical Activity: Not on file  Stress: Not on file  Social Connections: Not on file  Intimate Partner Violence: Not on file    Allergies  Allergen Reactions  . Penicillins Hives    Did it involve swelling of the face/tongue/throat, SOB, or low BP? N Did it involve sudden or severe rash/hives, skin peeling, or any reaction on the inside of your mouth or nose? Y Did you need to seek medical attention at a hospital or doctor's office? Unknown When did it last happen?childhood If all above answers are "NO", may proceed with cephalosporin use.   . Methocarbamol Nausea And Vomiting and Other (See Comments)    Body shaking Body shaking, nausea   . Azithromycin Hives    History reviewed. No pertinent family history.  Prior to Admission medications  Medication Sig Start Date End Date Taking? Authorizing Provider  insulin aspart (NOVOLOG) 100 UNIT/ML FlexPen Inject 1-10 Units into the skin 4 (four) times daily -  with meals and at bedtime. Sliding scale 06/02/18   [provider]  Insulin Glargine (BASAGLAR KWIKPEN) 100 UNIT/ML Inject 14 Units into the skin every evening. 03/02/19 03/01/20  [provider]  labetalol (NORMODYNE) 100 MG tablet Take 300 mg by mouth in the morning, at noon, and at bedtime. 03/29/19   [provider]  NIFEdipine (PROCARDIA XL/NIFEDICAL XL) 60 MG 24 hr tablet Take 60 mg by mouth 2 (two) times daily. 03/29/19   [provider]    Physical Exam: Vitals:   01/13/20 1230 01/13/20 1330 01/13/20 1400 01/13/20 1430  BP: (!) 184/102 (!) 222/201 (!) 177/95 (!) 185/90  Pulse: 87 92 86 88  Resp: 14 20 (!) 7 11  Temp:      TempSrc:      SpO2: 100% 100% 98% 98%  Weight:      Height:         . General:  Appears calm and comfortable and is in NAD . Eyes:  PERRL, EOMI, normal lids, iris . ENT:  grossly normal hearing, lips & tongue, mmm; appropriate dentition . Neck:  no LAD, masses or thyromegaly . Cardiovascular:  RRR, no m/r/g. No LE edema.  Marland Kitchen Respiratory:   CTA bilaterally with no wheezes/rales/rhonchi.  Normal respiratory effort. . Abdomen:  soft, mild suprapubic TTP, ND, NABS . Back:   normal alignment, no CVAT . Skin:  no rash or induration seen on limited exam . Musculoskeletal:  grossly normal tone BUE/BLE, good ROM, no bony abnormality . Psychiatric:  grossly normal mood and affect, speech fluent and appropriate, AOx3 . Neurologic:  CN 2-12 grossly intact, moves all extremities in coordinated fashion    Radiological Exams on Admission: Independently reviewed - see discussion in A/P where applicable  CT ABDOMEN PELVIS W CONTRAST  Result Date: 01/13/2020 CLINICAL DATA:  Abdominal pain, nausea, vomiting and headache. Hypertension. History of gastroparesis. EXAM: CT ABDOMEN AND PELVIS WITH CONTRAST TECHNIQUE: Multidetector CT imaging of the abdomen and pelvis was performed using the standard protocol following bolus administration of intravenous contrast. CONTRAST:  100 mL OMNIPAQUE IOHEXOL 300 MG/ML  SOLN COMPARISON:  None. FINDINGS: Lower chest: Lung bases are clear. No pleural or pericardial effusion. Hepatobiliary: No focal liver abnormality is seen. No gallstones, gallbladder wall thickening, or biliary dilatation. Pancreas: Unremarkable. No pancreatic ductal dilatation or surrounding inflammatory changes. Spleen: Normal in size without focal abnormality. Adrenals/Urinary Tract: The adrenal  glands appear normal. The patient has mild right hydronephrosis. No urinary tract stones are identified. Tiny cyst in the upper pole of the right kidney is noted. The left kidney appears normal. The urinary bladder is incompletely distended and unremarkable in appearance. Stomach/Bowel: Stomach is within normal limits. Gastric stimulator noted. The appendix is not seen but no evidence of appendicitis is identified. No evidence of bowel wall thickening, distention, or inflammatory changes. Vascular/Lymphatic: No significant vascular findings are present. No enlarged abdominal or pelvic lymph nodes. Reproductive: Uterus and bilateral adnexa are unremarkable. Other: Small volume of free pelvic fluid is greater than typically seen in physiologic change. Musculoskeletal: Negative. IMPRESSION: Mild right hydronephrosis without stone or other obstructing lesion identified. Small volume of free pelvic fluid is greater than typically seen in physiologic change but the cause for the fluid is not identified. Electronically Signed   By: Drusilla Kanner M.D.   On:  01/13/2020 15:13    EKG: Independently reviewed.  NSR with rate 81; LVH; nonspecific ST changes with no evidence of acute ischemia   Labs on Admission: I have personally reviewed the available labs and imaging studies at the time of the admission.  Pertinent labs:   CO2 20 Glucose 304 BUN 15/Creatinine 1.21/GFR >60 WBC 12.3 Hgb 11.9 HCG <5 UA: >500 glucose; 20 ketones; small Hgb; small LE; +nitrite; ?300 protein; many bacteria; >50 WBC   Assessment/Plan Principal Problem:   Cystitis Active Problems:   Type 1 diabetes mellitus with hyperglycemia, with long-term current use of insulin (HCC)   Diabetic gastroparesis (HCC)   Essential hypertension   Marijuana abuse    Cystitis -Patient presenting without sepsis physiology but with symptoms concerning for UTI - suprapubic pain, urinary frequency -Today's UA appears to demonstrate  infection -CT with R hydronephrosis without apparent stone or obstruction; no clinical pyelo -Will continue treatment with Rocephin (received first dose in the ER) -Continue gentle IVF hydration overnight -Will observe overnight, anticipate discharge to home 12/18 unless new issues arise  DM -Wears insulin pump but did not bring electronics with her and so not functional -Will check A1c -hold Glucophage -Cover with sensitive-scale SSI including AC and qhs coverage; will add 10 units of basal insulin (Lantus)  Gastroparesis -Previously with stimulator but this has been removed -She reports doing better in this regard -UTI likely exacerbated gastroparesis -Will add Reglan for now, 10mg  IV qh prn -Will also add Zostrix as needed  HTN -Continue labetalol -Consider addition of ACE/ARB given DM  Marijuana abuse -Cessation encouraged; this should be encouraged on an ongoing basis -UDS ordered    Note: This patient has been tested and is pending for the novel coronavirus COVID-19. The patient has been fully vaccinated against COVID-19.    DVT prophylaxis:   Lovenox  Code Status:  Full  Family Communication: None present Disposition Plan:  The patient is from: home  Anticipated d/c is to: home without [facility] services  Anticipated d/c date will depend on clinical response to treatment, but possibly as early as tomorrow if she has excellent response to treatment  Patient is currently: acutely ill Consults called: None Admission status:  It is my clinical opinion that referral for OBSERVATION is reasonable and necessary in this patient based on the above information provided. The aforementioned taken together are felt to place the patient at high risk for further clinical deterioration. However it is anticipated that the patient may be medically stable for discharge from the hospital within 24 to 48 hours.    [facility] MD [facility]   How to contact the [facility] Attending or  Consulting provider 7A - 7P or covering provider during after hours 7P -7A, for this patient?  1. Check the care team in [facility] and look for a) attending/consulting TR[facility] provider listed and b) the [facility] team listed 2. Log into [email or web address] and use [place]'s universal password to access. If you do not have the password, please contact the hospital operator. 3. Locate the [facility] provider you are looking for under [facility] and page to a number that you can be directly reached. 4. If you still have difficulty reaching the provider, please page the [facility] (Director on Call) for the Hospitalists listed on amion for assistance.   01/13/2020, 5:23 PM

## 2020-01-13 NOTE — Progress Notes (Signed)
Hypoglycemic Event  CBG: 19  Treatment: 4oz orange juice  Symptoms: Hungry, sweating   Follow-up CBG: Time:2333 CBG Result:*14  Possible Reasons for Event: Pt did not eat dinner Comments/MD notified: Blount,NP    Tamara Lane

## 2020-01-13 NOTE — ED Notes (Signed)
Call to lab to add urine culture.

## 2020-01-13 NOTE — ED Triage Notes (Signed)
Pt bib ems with reports of abd pain, NV and headache. Pt refused IV with ems. 190/100, HR 80. Hx gastroparesis.

## 2020-01-13 NOTE — ED Provider Notes (Signed)
[facility] EMERGENCY DEPARTMENT Provider Note   CSN: [id number] Arrival date & time: 01/13/20  0750     History Chief Complaint  Patient presents with  . Abdominal Pain    Tamara Lane is a 34 y.o. female.  The history is provided by the patient.  Abdominal Pain Pain location:  LLQ Pain quality: aching, cramping and gnawing   Pain radiates to:  Does not radiate Pain severity:  Severe Onset quality:  Gradual Duration:  1 day Timing:  Constant Progression:  Worsening Chronicity:  Recurrent Context comment:  Patient with a history of diabetes, gastroparesis who had a gastric stimulator that was removed in June Relieved by:  Nothing Worsened by:  Palpation and eating Associated symptoms: anorexia, nausea and vomiting   Associated symptoms: no chills, no cough, no diarrhea, no fever, no hematemesis and no shortness of breath   Associated symptoms comment:  Also having some urinary frequency Risk factors comment:  Diabetes, hypertension, gastroparesis.  Has not had blood pressure medications today      Past Medical History:  Diagnosis Date  . Diabetes mellitus without complication (HCC)   . Gastroparesis   . Hypertension   . Renal insufficiency affecting pregnancy     There are no problems to display for this patient.   Past Surgical History:  Procedure Laterality Date  . CESAREAN SECTION    . GASTRIC STIMULATOR IMPLANT SURGERY  2016     OB History    Gravida  2   Para  1   Term      Preterm  1   AB      Living  1     SAB      IAB      Ectopic      Multiple      Live Births  1           No family history on file.  Social History   Tobacco Use  . Smoking status: Never Smoker  . Smokeless tobacco: Never Used  Substance Use Topics  . Alcohol use: Not Currently  . Drug use: Not Currently    Home Medications Prior to Admission medications   Medication Sig Start Date End Date Taking? Authorizing Provider   insulin aspart (NOVOLOG) 100 UNIT/ML FlexPen Inject 1-10 Units into the skin 4 (four) times daily -  with meals and at bedtime. Sliding scale 06/02/18   [provider]  Insulin Glargine (BASAGLAR KWIKPEN) 100 UNIT/ML Inject 14 Units into the skin every evening. 03/02/19 03/01/20  [provider]  labetalol (NORMODYNE) 100 MG tablet Take 300 mg by mouth in the morning, at noon, and at bedtime. 03/29/19   [provider]  NIFEdipine (PROCARDIA XL/NIFEDICAL XL) 60 MG 24 hr tablet Take 60 mg by mouth 2 (two) times daily. 03/29/19   [provider]    Allergies    Penicillins, Methocarbamol, and Azithromycin  Review of Systems   Review of Systems  Constitutional: Negative for chills and fever.  Respiratory: Negative for cough and shortness of breath.   Gastrointestinal: Positive for abdominal pain, anorexia, nausea and vomiting. Negative for diarrhea and hematemesis.  All other systems reviewed and are negative.   Physical Exam Updated Vital Signs BP (!) 184/102   Pulse 87   Temp 98.1 F (36.7 C) (Oral)   Resp 14   Ht 5\' 5"  (1.651 m)   Wt 56.2 kg   SpO2 100%   BMI 20.63 kg/m   Physical  Exam Vitals and nursing note reviewed.  Constitutional:      General: She is in acute distress.     Appearance: She is well-developed and well-nourished.     Comments: Appears uncomfortable, rocking in the bed  HENT:     Head: Normocephalic and atraumatic.     Mouth/Throat:     Mouth: Mucous membranes are dry.  Eyes:     Extraocular Movements: EOM normal.     Pupils: Pupils are equal, round, and reactive to light.  Cardiovascular:     Rate and Rhythm: Normal rate and regular rhythm.     Pulses: Intact distal pulses.     Heart sounds: Normal heart sounds. No murmur heard. No friction rub.  Pulmonary:     Effort: Pulmonary effort is normal.     Breath sounds: Normal breath sounds. No wheezing or rales.  Abdominal:     General: Abdomen is flat. Bowel sounds are  normal. There is no distension.     Palpations: Abdomen is soft.     Tenderness: There is abdominal tenderness in the left lower quadrant. There is no guarding or rebound.     Comments: Multiple well-healed surgical scars.  No hernias present.  Insulin pump present in the right lower quadrant  Musculoskeletal:        General: No tenderness. Normal range of motion.     Comments: No edema  Skin:    General: Skin is warm and dry.     Findings: No rash.  Neurological:     General: No focal deficit present.     Mental Status: She is alert and oriented to person, place, and time. Mental status is at baseline.     Cranial Nerves: No cranial nerve deficit.     Gait: Gait normal.  Psychiatric:        Mood and Affect: Mood and affect and mood normal.        Behavior: Behavior normal.        Thought Content: Thought content normal.     ED Results / Procedures / Treatments   Labs (all labs ordered are listed, but only abnormal results are displayed) Labs Reviewed  COMPREHENSIVE METABOLIC PANEL - Abnormal; Notable for the following components:      Result Value   Sodium 134 (*)    CO2 20 (*)    Glucose, Bld 304 (*)    Creatinine, Ser 1.21 (*)    All other components within normal limits  CBC - Abnormal; Notable for the following components:   WBC 12.3 (*)    Hemoglobin 11.9 (*)    All other components within normal limits  URINALYSIS, ROUTINE W REFLEX MICROSCOPIC - Abnormal; Notable for the following components:   APPearance HAZY (*)    Glucose, UA >=500 (*)    Hgb urine dipstick SMALL (*)    Ketones, ur 20 (*)    Protein, ur >=300 (*)    Nitrite POSITIVE (*)    Leukocytes,Ua SMALL (*)    WBC, UA >50 (*)    Bacteria, UA MANY (*)    All other components within normal limits  URINE CULTURE  LIPASE, BLOOD  I-STAT BETA HCG BLOOD, ED ([facility], [facility], [facility] ONLY)    EKG None  Radiology No results found.  Procedures Procedures (including critical care time)  Medications Ordered in  ED Medications  HYDROmorphone (DILAUDID) injection 1 mg (has no administration in time range)  ondansetron (ZOFRAN) injection 4 mg (has no administration in time range)  labetalol (NORMODYNE) injection 20 mg (has no administration in time range)  ondansetron (ZOFRAN-ODT) disintegrating tablet 4 mg (4 mg Oral Given 01/13/20 1133)    ED Course  I have reviewed the triage vital signs and the nursing notes.  Pertinent labs & imaging results that were available during my care of the patient were reviewed by me and considered in my medical decision making (see chart for details).    MDM Rules/Calculators/A&P                          34 year old female with a history of type 1 diabetes currently on insulin pump, gastroparesis and hypertension presenting today with worsening abdominal pain and vomiting.  Patient did have a gastric stimulator but it was removed in June because she reported she was having more bad side effects than improvement.  She reports she was in her normal state of health until last night when she started having severe abdominal cramping and pain.  She has had intermittent vomiting since and symptoms are not improving.  She reports she does not have severe nausea at this time but is still having severe left lower quadrant abdominal pain.  She does report that when she has the pain this is usually where it hurts.  She denies any hematemesis, fevers or respiratory complaints.  She is hypertensive here but otherwise heart rate and oxygen saturation are within normal limits.  Suspect exacerbation of known chronic problems.  Low suspicion for appendicitis and gastroparesis as patient does not have an anion gap at this time blood sugar is 300.  She reports she did forget her phone at home so her pump will not work right now and will continue to monitor that to ensure she does not develop DKA.  She denies any urinary symptoms.  Creatinine is only slightly above baseline at 1.2, LFTs and lipase  are within normal limits and CBC with mild leukocytosis of 12.  hCG is negative.  Will give patient IV fluids, pain and nausea medication.  Will reevaluate.  2:12 PM Today patient also has urine that is nitrite positive, leukocyte positive with greater than 50 white blood cells and many bacteria concern for possible pyelonephritis versus a retained kidney stone.  Patient reports she used to get urinary tract infections while pregnant but does otherwise not get them regularly.  Will give IV Rocephin.  CT to further evaluate pending.  After initial round of medications patient is feeling better.  Final Clinical Impression(s) / ED Diagnoses Final diagnoses:  None    Rx / DC Orders ED Discharge Orders    None       Gwyneth Sprout, MD 01/16/20 1507

## 2020-01-13 NOTE — ED Notes (Signed)
RN attempted to call report to [facility] RN. Secretary took this RN's work phone number & was told that 6N RN will call back within 10 minutes for report

## 2020-01-14 ENCOUNTER — Other Ambulatory Visit: Payer: Self-pay

## 2020-01-14 DIAGNOSIS — Z9641 Presence of insulin pump (external) (internal): Secondary | ICD-10-CM | POA: Diagnosis present

## 2020-01-14 DIAGNOSIS — Z880 Allergy status to penicillin: Secondary | ICD-10-CM | POA: Diagnosis not present

## 2020-01-14 DIAGNOSIS — Z888 Allergy status to other drugs, medicaments and biological substances status: Secondary | ICD-10-CM | POA: Diagnosis not present

## 2020-01-14 DIAGNOSIS — N3090 Cystitis, unspecified without hematuria: Secondary | ICD-10-CM | POA: Diagnosis not present

## 2020-01-14 DIAGNOSIS — N189 Chronic kidney disease, unspecified: Secondary | ICD-10-CM | POA: Diagnosis present

## 2020-01-14 DIAGNOSIS — E1022 Type 1 diabetes mellitus with diabetic chronic kidney disease: Secondary | ICD-10-CM | POA: Diagnosis present

## 2020-01-14 DIAGNOSIS — E1043 Type 1 diabetes mellitus with diabetic autonomic (poly)neuropathy: Secondary | ICD-10-CM | POA: Diagnosis present

## 2020-01-14 DIAGNOSIS — I10 Essential (primary) hypertension: Secondary | ICD-10-CM | POA: Diagnosis present

## 2020-01-14 DIAGNOSIS — N136 Pyonephrosis: Secondary | ICD-10-CM | POA: Diagnosis present

## 2020-01-14 DIAGNOSIS — B9620 Unspecified Escherichia coli [E. coli] as the cause of diseases classified elsewhere: Secondary | ICD-10-CM | POA: Diagnosis present

## 2020-01-14 DIAGNOSIS — Z20822 Contact with and (suspected) exposure to covid-19: Secondary | ICD-10-CM | POA: Diagnosis present

## 2020-01-14 DIAGNOSIS — N179 Acute kidney failure, unspecified: Secondary | ICD-10-CM | POA: Diagnosis present

## 2020-01-14 DIAGNOSIS — Z881 Allergy status to other antibiotic agents status: Secondary | ICD-10-CM | POA: Diagnosis not present

## 2020-01-14 DIAGNOSIS — E860 Dehydration: Secondary | ICD-10-CM | POA: Diagnosis present

## 2020-01-14 DIAGNOSIS — F1210 Cannabis abuse, uncomplicated: Secondary | ICD-10-CM | POA: Diagnosis present

## 2020-01-14 DIAGNOSIS — Z794 Long term (current) use of insulin: Secondary | ICD-10-CM | POA: Diagnosis not present

## 2020-01-14 DIAGNOSIS — N12 Tubulo-interstitial nephritis, not specified as acute or chronic: Secondary | ICD-10-CM | POA: Diagnosis present

## 2020-01-14 DIAGNOSIS — K3184 Gastroparesis: Secondary | ICD-10-CM | POA: Diagnosis present

## 2020-01-14 DIAGNOSIS — I129 Hypertensive chronic kidney disease with stage 1 through stage 4 chronic kidney disease, or unspecified chronic kidney disease: Secondary | ICD-10-CM | POA: Diagnosis present

## 2020-01-14 DIAGNOSIS — E1065 Type 1 diabetes mellitus with hyperglycemia: Secondary | ICD-10-CM | POA: Diagnosis present

## 2020-01-14 LAB — CBC
HCT: 30.2 % — ABNORMAL LOW (ref 36.0–46.0)
Hemoglobin: 10.3 g/dL — ABNORMAL LOW (ref 12.0–15.0)
MCH: 27.3 pg (ref 26.0–34.0)
MCHC: 34.1 g/dL (ref 30.0–36.0)
MCV: 80.1 fL (ref 80.0–100.0)
Platelets: 205 10*3/uL (ref 150–400)
RBC: 3.77 MIL/uL — ABNORMAL LOW (ref 3.87–5.11)
RDW: 15.1 % (ref 11.5–15.5)
WBC: 13.3 10*3/uL — ABNORMAL HIGH (ref 4.0–10.5)
nRBC: 0 % (ref 0.0–0.2)

## 2020-01-14 LAB — BASIC METABOLIC PANEL
Anion gap: 10 (ref 5–15)
Anion gap: 11 (ref 5–15)
BUN: 22 mg/dL — ABNORMAL HIGH (ref 6–20)
BUN: 23 mg/dL — ABNORMAL HIGH (ref 6–20)
CO2: 19 mmol/L — ABNORMAL LOW (ref 22–32)
CO2: 20 mmol/L — ABNORMAL LOW (ref 22–32)
Calcium: 8.2 mg/dL — ABNORMAL LOW (ref 8.9–10.3)
Calcium: 8.4 mg/dL — ABNORMAL LOW (ref 8.9–10.3)
Chloride: 98 mmol/L (ref 98–111)
Chloride: 102 mmol/L (ref 98–111)
Creatinine, Ser: 1.52 mg/dL — ABNORMAL HIGH (ref 0.44–1.00)
Creatinine, Ser: 1.58 mg/dL — ABNORMAL HIGH (ref 0.44–1.00)
GFR, Estimated: 46 mL/min — ABNORMAL LOW (ref >60)
GFR, Estimated: 44 mL/min — ABNORMAL LOW (ref >60)
Glucose, Bld: 548 mg/dL (ref 70–99)
Glucose, Bld: 166 mg/dL — ABNORMAL HIGH (ref 70–99)
Potassium: 4.5 mmol/L (ref 3.5–5.1)
Potassium: 3.9 mmol/L (ref 3.5–5.1)
Sodium: 127 mmol/L — ABNORMAL LOW (ref 135–145)
Sodium: 133 mmol/L — ABNORMAL LOW (ref 135–145)

## 2020-01-14 LAB — GLUCOSE, CAPILLARY
Glucose-Capillary: 238 mg/dL — ABNORMAL HIGH (ref 70–99)
Glucose-Capillary: 350 mg/dL — ABNORMAL HIGH (ref 70–99)
Glucose-Capillary: 42 mg/dL — CL (ref 70–99)
Glucose-Capillary: 441 mg/dL — ABNORMAL HIGH (ref 70–99)
Glucose-Capillary: 444 mg/dL — ABNORMAL HIGH (ref 70–99)
Glucose-Capillary: 51 mg/dL — ABNORMAL LOW (ref 70–99)
Glucose-Capillary: 89 mg/dL (ref 70–99)
Glucose-Capillary: 94 mg/dL (ref 70–99)
Glucose-Capillary: 99 mg/dL (ref 70–99)

## 2020-01-14 LAB — RAPID URINE DRUG SCREEN, HOSP PERFORMED
Amphetamines: NOT DETECTED
Barbiturates: NOT DETECTED
Benzodiazepines: NOT DETECTED
Cocaine: NOT DETECTED
Opiates: NOT DETECTED
Tetrahydrocannabinol: POSITIVE — AB

## 2020-01-14 MED ORDER — INSULIN ASPART 100 UNIT/ML ~~LOC~~ SOLN
9.0000 [IU] | Freq: Once | SUBCUTANEOUS | Status: AC
  Administered 2020-01-14: 9 [IU] via SUBCUTANEOUS

## 2020-01-14 MED ORDER — INSULIN GLARGINE 100 UNIT/ML ~~LOC~~ SOLN
10.0000 [IU] | Freq: Every day | SUBCUTANEOUS | Status: DC
  Administered 2020-01-14 – 2020-01-15 (×2): 10 [IU] via SUBCUTANEOUS
  Filled 2020-01-14 (×4): qty 0.1

## 2020-01-14 MED ORDER — INSULIN ASPART 100 UNIT/ML ~~LOC~~ SOLN
0.0000 [IU] | Freq: Three times a day (TID) | SUBCUTANEOUS | Status: DC
  Administered 2020-01-14: 2 [IU] via SUBCUTANEOUS
  Administered 2020-01-15: 3 [IU] via SUBCUTANEOUS
  Administered 2020-01-15 – 2020-01-16 (×2): 1 [IU] via SUBCUTANEOUS
  Administered 2020-01-16: 3 [IU] via SUBCUTANEOUS

## 2020-01-14 MED ORDER — INSULIN ASPART 100 UNIT/ML ~~LOC~~ SOLN
0.0000 [IU] | Freq: Every day | SUBCUTANEOUS | Status: DC
  Administered 2020-01-14: 2 [IU] via SUBCUTANEOUS
  Administered 2020-01-15: 3 [IU] via SUBCUTANEOUS

## 2020-01-14 MED ORDER — DIPHENHYDRAMINE HCL 50 MG/ML IJ SOLN
25.0000 mg | Freq: Four times a day (QID) | INTRAMUSCULAR | Status: DC | PRN
  Administered 2020-01-14 – 2020-01-16 (×5): 25 mg via INTRAVENOUS
  Filled 2020-01-14 (×5): qty 1

## 2020-01-14 MED ORDER — DEXTROSE 50 % IV SOLN
INTRAVENOUS | Status: AC
  Administered 2020-01-14: 50 mL
  Filled 2020-01-14: qty 50

## 2020-01-14 MED ORDER — DEXTROSE 50 % IV SOLN
25.0000 mL | Freq: Once | INTRAVENOUS | Status: AC
  Administered 2020-01-14: 25 mL via INTRAVENOUS

## 2020-01-14 NOTE — Progress Notes (Signed)
Lab called to add on urine drug screen.  °

## 2020-01-14 NOTE — Progress Notes (Signed)
CRITICAL VALUE ALERT  Critical Value:  Glucose 548  Date & Time Notied:  01/14/20 0455   Provider Notified: Blount,NP  Orders Received/Actions taken: Text paged

## 2020-01-14 NOTE — Progress Notes (Signed)
CRITICAL VALUE ALERT  Critical Value: CBG 444  Date & Time Notied:  01/14/20 0240  Provider Notified: Blount,NP  Orders Received/Actions taken: Text paged NP , orders to decrease rate

## 2020-01-14 NOTE — Care Management Obs Status (Signed)
MEDICARE OBSERVATION STATUS NOTIFICATION   Patient Details  Name: Tamara Lane MRN: [id number] Date of Birth: 1985-03-15   Medicare Observation Status Notification Given:  Yes    Bess Kinds, RN 01/14/2020, 1:46 PM

## 2020-01-14 NOTE — Progress Notes (Signed)
Text paged NP for a call back. NP called back , verified with NP if she wanted the IV Fluids rate decreased, she verbalized  "decrease rate" .

## 2020-01-14 NOTE — Progress Notes (Signed)
CBG RESULTS   01/13/20 23:19   CBG 19   01/13/20 23:33 CBG 14  01/13/20 2346   CBG 29  Glucometer Docked- not transferred to [facility]

## 2020-01-14 NOTE — Progress Notes (Signed)
Progress Note    Tamara Lane  NWG:[id number] DOB: 27-Jan-1986  DOA: 01/13/2020 PCP: Patient, No Pcp Per    Brief Narrative:     Medical records reviewed and are as summarized below:  Tamara Lane is an 34 y.o. female with medical history significant of DM with gastroparesis s/p gastric stimulator that was removed in June; HTN; and CKD presenting with n/v.  Found to have a UTI  Assessment/Plan:   Principal Problem:   Cystitis Active Problems:   Type 1 diabetes mellitus with hyperglycemia, with long-term current use of insulin (HCC)   Diabetic gastroparesis (HCC)   Essential hypertension   Marijuana abuse   Gram negative UTI -Patient presenting without sepsis physiology but with symptoms concerning for UTI - suprapubic pain, urinary frequency -Today's UA appears to demonstrate infection -CT with R hydronephrosis without apparent stone or obstruction; no clinical pyelo -IV abx -Continue gentle IVF hydration overnight -N/V improved  AKI -unclear -recheck BMP  psuedo-hyponatremia -recheck BMP at 4PM -due to elevated blood sugars  DM- type 1 -Wears insulin pump but did not bring electronics with her and so not functional -Cover with very sensitive-scale SSI including AC and qhs coverage;  10 units of basal insulin (Lantus)  Gastroparesis -Previously with stimulator but this has been removed -She reports doing better in this regard -UTI likely exacerbated gastroparesis -Will add Reglan for now, 10mg  IV qh prn -Will also add Zostrix as needed  HTN -Continue labetalol -Consider addition of ACE/ARB given DM but currently with AKI so defer to outpatient  Marijuana abuse -Cessation encouraged; this should be encouraged on an ongoing basis    Family Communication/Anticipated D/C date and plan/Code Status   Code Status: Full Code.  Disposition Plan: Status is: Observation  The patient will require care spanning > 2 midnights and should be moved to  inpatient because: IV treatments appropriate due to intensity of illness or inability to take PO  Dispo: The patient is from: Home              Anticipated d/c is to: Home              Anticipated d/c date is: 1 day              Patient currently is not medically stable to d/c.         Medical Consultants:    None.     Subjective:   Able to eat Our blood sugar check shows: 96 and her meter shows 148  Objective:    Vitals:   01/13/20 2315 01/14/20 0226 01/14/20 0410 01/14/20 0946  BP: 140/77 120/62 (!) 163/83 123/66  Pulse: 89 89 91 93  Resp: 15 17  16   Temp: 98.7 F (37.1 C) 98.1 F (36.7 C) 98 F (36.7 C) 98.6 F (37 C)  TempSrc: Oral Oral Oral Oral  SpO2: 100%  100% 95%  Weight:      Height:        Intake/Output Summary (Last 24 hours) at 01/14/2020 1245 Last data filed at 01/14/2020 0300 Gross per 24 hour  Intake 240 ml  Output --  Net 240 ml   Filed Weights   01/13/20 0755  Weight: 56.2 kg    Exam:  General: Appearance:    Well developed, well nourished female in no acute distress   NO CVA tenderness  Lungs:     Clear to auscultation bilaterally, respirations unlabored  Heart:    Normal heart rate. Normal rhythm. No  murmurs, rubs, or gallops.   MS:   All extremities are intact.   Neurologic:   Awake, alert, oriented x 3. No apparent focal neurological           defect.     Data Reviewed:   I have personally reviewed following labs and imaging studies:  Labs: Labs show the following:   Basic Metabolic Panel: Recent Labs  Lab 01/13/20 0754 01/14/20 0401  NA 134* 127*  K 3.7 4.5  CL 100 98  CO2 20* 19*  GLUCOSE 304* 548*  BUN 15 22*  CREATININE 1.21* 1.52*  CALCIUM 8.9 8.2*   GFR Estimated Creatinine Clearance: 46.3 mL/min (A) (by C-G formula based on SCr of 1.52 mg/dL (H)). Liver Function Tests: Recent Labs  Lab 01/13/20 0754  AST 18  ALT 13  ALKPHOS 88  BILITOT 0.7  PROT 7.1  ALBUMIN 3.8   Recent Labs  Lab  01/13/20 0754  LIPASE 22   No results for input(s): AMMONIA in the last 168 hours. Coagulation profile No results for input(s): INR, PROTIME in the last 168 hours.  CBC: Recent Labs  Lab 01/13/20 0754 01/14/20 0401  WBC 12.3* 13.3*  HGB 11.9* 10.3*  HCT 36.3 30.2*  MCV 81.9 80.1  PLT 258 205   Cardiac Enzymes: No results for input(s): CKTOTAL, CKMB, CKMBINDEX, TROPONINI in the last 168 hours. BNP (last 3 results) No results for input(s): PROBNP in the last 8760 hours. CBG: Recent Labs  Lab 01/14/20 0605 01/14/20 0735 01/14/20 0901 01/14/20 1137 01/14/20 1224  GLUCAP 441* 350* 99 42* 89   D-Dimer: No results for input(s): DDIMER in the last 72 hours. Hgb A1c: Recent Labs    01/13/20 1727  HGBA1C 8.2*   Lipid Profile: No results for input(s): CHOL, HDL, LDLCALC, TRIG, CHOLHDL, LDLDIRECT in the last 72 hours. Thyroid function studies: Recent Labs    01/13/20 1727  TSH 0.568   Anemia work up: No results for input(s): VITAMINB12, FOLATE, FERRITIN, TIBC, IRON, RETICCTPCT in the last 72 hours. Sepsis Labs: Recent Labs  Lab 01/13/20 0754 01/14/20 0401  WBC 12.3* 13.3*    Microbiology Recent Results (from the past 240 hour(s))  Urine Culture     Status: Abnormal (Preliminary result)   Collection Time: 01/13/20  1:53 PM   Specimen: Urine, Random  Result Value Ref Range Status   Specimen Description URINE, RANDOM  Final   Special Requests NONE  Final   Culture (A)  Final    >=100,000 COLONIES/mL GRAM NEGATIVE RODS SUSCEPTIBILITIES TO FOLLOW Performed at [facility], [address] [place], [place] [address]    Report Status PENDING  Incomplete  Resp Panel by RT-PCR (Flu A&B, Covid) Nasopharyngeal Swab     Status: None   Collection Time: 01/13/20  9:32 PM   Specimen: Nasopharyngeal Swab; Nasopharyngeal(NP) swabs in vial transport medium  Result Value Ref Range Status   SARS Coronavirus 2 by RT PCR NEGATIVE NEGATIVE Final    Comment:  (NOTE) SARS-CoV-2 target nucleic acids are NOT DETECTED.  The SARS-CoV-2 RNA is generally detectable in upper respiratory specimens during the acute phase of infection. The lowest concentration of SARS-CoV-2 viral copies this assay can detect is 138 copies/mL. A negative result does not preclude SARS-Cov-2 infection and should not be used as the sole basis for treatment or other patient management decisions. A negative result may occur with  improper specimen collection/handling, submission of specimen other than nasopharyngeal swab, presence of viral mutation(s) within the  areas targeted by this assay, and inadequate number of viral copies(<138 copies/mL). A negative result must be combined with clinical observations, patient history, and epidemiological information. The expected result is Negative.  Fact Sheet for Patients:  BloggerCourse.com  Fact Sheet for Healthcare Providers:  SeriousBroker.it  This test is no t yet approved or cleared by the [place] FDA and  has been authorized for detection and/or diagnosis of SARS-CoV-2 by FDA under an Emergency Use Authorization (EUA). This EUA will remain  in effect (meaning this test can be used) for the duration of the COVID-19 declaration under Section 564(b)(1) of the Act, 21 U.S.C.section 360bbb-3(b)(1), unless the authorization is terminated  or revoked sooner.       Influenza A by PCR NEGATIVE NEGATIVE Final   Influenza B by PCR NEGATIVE NEGATIVE Final    Comment: (NOTE) The Xpert Xpress SARS-CoV-2/FLU/RSV plus assay is intended as an aid in the diagnosis of influenza from Nasopharyngeal swab specimens and should not be used as a sole basis for treatment. Nasal washings and aspirates are unacceptable for Xpert Xpress SARS-CoV-2/FLU/RSV testing.  Fact Sheet for Patients: BloggerCourse.com  Fact Sheet for Healthcare  Providers: SeriousBroker.it  This test is not yet approved or cleared by the [place] FDA and has been authorized for detection and/or diagnosis of SARS-CoV-2 by FDA under an Emergency Use Authorization (EUA). This EUA will remain in effect (meaning this test can be used) for the duration of the COVID-19 declaration under Section 564(b)(1) of the Act, 21 U.S.C. section 360bbb-3(b)(1), unless the authorization is terminated or revoked.  Performed at [facility], [address] [place], [place] [address]     Procedures and diagnostic studies:  CT ABDOMEN PELVIS W CONTRAST  Result Date: 01/13/2020 CLINICAL DATA:  Abdominal pain, nausea, vomiting and headache. Hypertension. History of gastroparesis. EXAM: CT ABDOMEN AND PELVIS WITH CONTRAST TECHNIQUE: Multidetector CT imaging of the abdomen and pelvis was performed using the standard protocol following bolus administration of intravenous contrast. CONTRAST:  100 mL OMNIPAQUE IOHEXOL 300 MG/ML  SOLN COMPARISON:  None. FINDINGS: Lower chest: Lung bases are clear. No pleural or pericardial effusion. Hepatobiliary: No focal liver abnormality is seen. No gallstones, gallbladder wall thickening, or biliary dilatation. Pancreas: Unremarkable. No pancreatic ductal dilatation or surrounding inflammatory changes. Spleen: Normal in size without focal abnormality. Adrenals/Urinary Tract: The adrenal glands appear normal. The patient has mild right hydronephrosis. No urinary tract stones are identified. Tiny cyst in the upper pole of the right kidney is noted. The left kidney appears normal. The urinary bladder is incompletely distended and unremarkable in appearance. Stomach/Bowel: Stomach is within normal limits. Gastric stimulator noted. The appendix is not seen but no evidence of appendicitis is identified. No evidence of bowel wall thickening, distention, or inflammatory changes. Vascular/Lymphatic: No significant  vascular findings are present. No enlarged abdominal or pelvic lymph nodes. Reproductive: Uterus and bilateral adnexa are unremarkable. Other: Small volume of free pelvic fluid is greater than typically seen in physiologic change. Musculoskeletal: Negative. IMPRESSION: Mild right hydronephrosis without stone or other obstructing lesion identified. Small volume of free pelvic fluid is greater than typically seen in physiologic change but the cause for the fluid is not identified. Electronically Signed   By: Drusilla Kanner M.D.   On: 01/13/2020 15:13    Medications:   . capsaicin   Topical BID  . docusate sodium  100 mg Oral BID  . enoxaparin (LOVENOX) injection  40 mg Subcutaneous Q24H  . insulin aspart  0-5  Units Subcutaneous QHS  . insulin aspart  0-6 Units Subcutaneous TID WC  . insulin glargine  10 Units Subcutaneous QHS  . labetalol  600 mg Oral TID   Continuous Infusions: . cefTRIAXone (ROCEPHIN)  IV 1 g (01/14/20 1203)  . lactated ringers Stopped (01/14/20 0002)     LOS: 0 days   Joseph Art  [facility]   How to contact the [facility] Attending or Consulting provider 7A - 7P or covering provider during after hours 7P -7A, for this patient?  1. Check the care team in [facility] and look for a) attending/consulting TR[facility] provider listed and b) the [facility] team listed 2. Log into [email or web address] and use [place]'s universal password to access. If you do not have the password, please contact the hospital operator. 3. Locate the [facility] provider you are looking for under [facility] and page to a number that you can be directly reached. 4. If you still have difficulty reaching the provider, please page the [facility] (Director on Call) for the Hospitalists listed on amion for assistance.  01/14/2020, 12:45 PM

## 2020-01-14 NOTE — TOC Initial Note (Signed)
Transition of Care [facility]) - Initial/Assessment Note    Patient Details  Name: Tamara Lane MRN: [id number] Date of Birth: 15-Nov-1985  [facility]) CM/SW Contact:    Bess Kinds, RN Phone Number: [phone] 01/14/2020, 1:48 PM  Clinical Narrative:                  Spoke with patient at the bedside. She anticipates discharge home tomorrow. Stated that she will have transportation home. Has PCP who is part of [facility] system. Encouraged to make follow up appointment. Verbalized understanding. TOC following for transition needs.   Expected Discharge Plan: Home/Self Care Barriers to Discharge: Continued Medical Work up   Patient Goals and CMS Choice Patient states their goals for this hospitalization and ongoing recovery are:: return home CMS Medicare.gov Compare Post Acute Care list provided to:: Patient Choice offered to / list presented to : NA  Expected Discharge Plan and Services Expected Discharge Plan: Home/Self Care   Discharge Planning Services: CM Consult Post Acute Care Choice: NA                   DME Arranged: N/A DME Agency: NA       HH Arranged: NA HH Agency: NA        Prior Living Arrangements/Services     Patient language and need for interpreter reviewed:: Yes        Need for Family Participation in Patient Care: No (Comment)     Criminal Activity/Legal Involvement Pertinent to Current Situation/Hospitalization: No - Comment as needed  Activities of Daily Living Home Assistive Devices/Equipment: CBG Meter,Insulin Pump ADL Screening (condition at time of admission) Patient's cognitive ability adequate to safely complete daily activities?: Yes Is the patient deaf or have difficulty hearing?: No Does the patient have difficulty seeing, even when wearing glasses/contacts?: No Does the patient have difficulty concentrating, remembering, or making decisions?: No Patient able to express need for assistance with ADLs?: Yes Does the  patient have difficulty dressing or bathing?: No Independently performs ADLs?: Yes (appropriate for developmental age) Does the patient have difficulty walking or climbing stairs?: No Weakness of Legs: None Weakness of Arms/Hands: None  Permission Sought/Granted                  Emotional Assessment Appearance:: Appears stated age Attitude/Demeanor/Rapport: Engaged Affect (typically observed): Accepting Orientation: : Oriented to Self,Oriented to Place,Oriented to  Time,Oriented to Situation Alcohol / Substance Use: Not Applicable Psych Involvement: No (comment)  Admission diagnosis:  Pyelonephritis [N12] Patient Active Problem List   Diagnosis Date Noted  . Cystitis 01/13/2020  . Type 1 diabetes mellitus with hyperglycemia, with long-term current use of insulin (HCC) 01/13/2020  . Diabetic gastroparesis (HCC) 01/13/2020  . Essential hypertension 01/13/2020  . Marijuana abuse 01/13/2020   PCP:  Patient, No Pcp Per Pharmacy:   [facility] [phone] [address] [address], [place] - [address] [place] [address] Phone: [phone] Fax: [phone]     Social Determinants of Health (SDOH) Interventions    Readmission Risk Interventions No flowsheet data found.

## 2020-01-15 LAB — CBC
HCT: 33.1 % — ABNORMAL LOW (ref 36.0–46.0)
Hemoglobin: 10.4 g/dL — ABNORMAL LOW (ref 12.0–15.0)
MCH: 25.9 pg — ABNORMAL LOW (ref 26.0–34.0)
MCHC: 31.4 g/dL (ref 30.0–36.0)
MCV: 82.5 fL (ref 80.0–100.0)
Platelets: 171 10*3/uL (ref 150–400)
RBC: 4.01 MIL/uL (ref 3.87–5.11)
RDW: 15.1 % (ref 11.5–15.5)
WBC: 9.3 10*3/uL (ref 4.0–10.5)
nRBC: 0 % (ref 0.0–0.2)

## 2020-01-15 LAB — BASIC METABOLIC PANEL
Anion gap: 10 (ref 5–15)
BUN: 16 mg/dL (ref 6–20)
CO2: 21 mmol/L — ABNORMAL LOW (ref 22–32)
Calcium: 8.4 mg/dL — ABNORMAL LOW (ref 8.9–10.3)
Chloride: 106 mmol/L (ref 98–111)
Creatinine, Ser: 1.34 mg/dL — ABNORMAL HIGH (ref 0.44–1.00)
GFR, Estimated: 53 mL/min — ABNORMAL LOW (ref >60)
Glucose, Bld: 100 mg/dL — ABNORMAL HIGH (ref 70–99)
Potassium: 3.9 mmol/L (ref 3.5–5.1)
Sodium: 137 mmol/L (ref 135–145)

## 2020-01-15 LAB — URINE CULTURE: Culture: >=100000 — AB

## 2020-01-15 LAB — GLUCOSE, CAPILLARY
Glucose-Capillary: 71 mg/dL (ref 70–99)
Glucose-Capillary: 127 mg/dL — ABNORMAL HIGH (ref 70–99)
Glucose-Capillary: 103 mg/dL — ABNORMAL HIGH (ref 70–99)
Glucose-Capillary: 288 mg/dL — ABNORMAL HIGH (ref 70–99)
Glucose-Capillary: 265 mg/dL — ABNORMAL HIGH (ref 70–99)

## 2020-01-15 MED ORDER — GLUCERNA SHAKE PO LIQD
237.0000 mL | Freq: Two times a day (BID) | ORAL | Status: DC
  Administered 2020-01-15: 237 mL via ORAL

## 2020-01-15 MED ORDER — CEFAZOLIN SODIUM-DEXTROSE 1-4 GM/50ML-% IV SOLN
1.0000 g | Freq: Three times a day (TID) | INTRAVENOUS | Status: DC
  Administered 2020-01-15 – 2020-01-16 (×3): 1 g via INTRAVENOUS
  Filled 2020-01-15 (×4): qty 50

## 2020-01-15 NOTE — Progress Notes (Signed)
Pt has been refusing Lovenox. Educated and encouraged to ambulate. MD notified.

## 2020-01-15 NOTE — Progress Notes (Signed)
Initial Nutrition Assessment  DOCUMENTATION CODES:   Not applicable  INTERVENTION:  Provide Glucerna Shake po BID, each supplement provides 220 kcal and 10 grams of protein.  Encourage adequate PO intake.   NUTRITION DIAGNOSIS:   Increased nutrient needs related to acute illness as evidenced by estimated needs.  GOAL:   Patient will meet greater than or equal to 90% of their needs  MONITOR:   PO intake,Supplement acceptance,Skin,Weight trends,Labs,I & O's  REASON FOR ASSESSMENT:   Consult Assessment of nutrition requirement/status  ASSESSMENT:   34 y.o. female with medical history significant of DM with gastroparesis s/p gastric stimulator that was removed in June; HTN; and CKD presenting with n/v.  Found to have a UTI  Pt unavailable during attempted time of contact. Per MD, pt history of gastroparesis has improved. RD to order nutritional supplements to aid in adequate caloric and protein needs. Pt with history of Type 1 diabetes mellitus and wears insulin pump, however did not bring pump on admission. Plans for SSI coverage while inpatient. Unable to complete Nutrition-Focused physical exam at this time.   Labs and medications reviewed.   Diet Order:   Diet Order            Diet Carb Modified Fluid consistency: Thin; Room service appropriate? Yes  Diet effective now                 EDUCATION NEEDS:   Not appropriate for education at this time  Skin:  Skin Assessment: Reviewed RN Assessment  Last BM:  Unknown  Height:   Ht Readings from Last 1 Encounters:  01/13/20 5\' 5"  (1.651 m)    Weight:   Wt Readings from Last 1 Encounters:  01/13/20 56.2 kg   BMI:  Body mass index is 20.63 kg/m.  Estimated Nutritional Needs:   Kcal:  1750-1900  Protein:  80-95 grams  Fluid:  >/= 1.7 L/day  01/15/20, MS, RD, LDN RD pager number/after hours weekend pager number on Amion.

## 2020-01-15 NOTE — Progress Notes (Signed)
Pharmacy Antibiotic Note  Tamara Lane is a 34 y.o. female admitted on 01/13/2020 with UTI.  Pharmacy has been consulted for cefazolin dosing. Patient has been on ceftriaxone; however, UCx positive for E. Coli sensitive to cefazolin. Will dose cefazolin 1g q8hr and switch to oral antimicrobials once patient is tolerating full oral diet.  Plan: initiate cefazolin 1g q8hr  Height: 5\' 5"  (165.1 cm) Weight: 56.2 kg (124 lb) IBW/kg (Calculated) : 57  Temp (24hrs), Avg:98.3 F (36.8 C), Min:97.6 F (36.4 C), Max:98.9 F (37.2 C)  Recent Labs  Lab 01/13/20 0754 01/14/20 0401 01/14/20 1522 01/15/20 0913  WBC 12.3* 13.3*  --  9.3  CREATININE 1.21* 1.52* 1.58* 1.34*    Estimated Creatinine Clearance: 52.5 mL/min (A) (by C-G formula based on SCr of 1.34 mg/dL (H)).    Allergies  Allergen Reactions  . Penicillins Hives    Did it involve swelling of the face/tongue/throat, SOB, or low BP? N Did it involve sudden or severe rash/hives, skin peeling, or any reaction on the inside of your mouth or nose? Y Did you need to seek medical attention at a hospital or doctor's office? Unknown When did it last happen?childhood If all above answers are "NO", may proceed with cephalosporin use.   . Methocarbamol Nausea And Vomiting and Other (See Comments)    Body shaking Body shaking, nausea   . Azithromycin Hives    Antimicrobials this admission: 12/17 ceftriaxone >> 12/19 12/19 cefazolin >>   Microbiology results: 12/17 Ucx E. Coli (R ampicillin, I amp/sulb)  Thank you for allowing pharmacy to be a part of this patient's care.  1/18, PharmD PGY2 ID Pharmacy Resident Phone between 7 am - 3:30 pm: Margarite Gouge  Please check AMION for all [facility] Pharmacy phone numbers After 10:00 PM, call Main Pharmacy [phone]   01/15/2020 11:49 AM

## 2020-01-15 NOTE — Progress Notes (Signed)
Progress Note    Tamara Lane  ZOX:[id number] DOB: July 02, 1985  DOA: 01/13/2020 PCP: Patient, No Pcp Per    Brief Narrative:     Medical records reviewed and are as summarized below:  Tamara Lane is an 34 y.o. female with medical history significant of DM with gastroparesis s/p gastric stimulator that was removed in June; HTN; and CKD presenting with n/v.  Found to have a UTI  Assessment/Plan:   Principal Problem:   Cystitis Active Problems:   Type 1 diabetes mellitus with hyperglycemia, with long-term current use of insulin (HCC)   Diabetic gastroparesis (HCC)   Essential hypertension   Marijuana abuse   Pyelonephritis   Gram negative UTI -Patient presenting without sepsis physiology but with symptoms concerning for UTI - suprapubic pain, urinary frequency -Today's UA appears to demonstrate infection -CT with R hydronephrosis without apparent stone or obstruction; no clinical pyelo -IV abx  AKI -IVF -due to dehydration  psuedo-hyponatremia -recheck BMP at 4PM -due to elevated blood sugars  DM- type 1 -Wears insulin pump but did not bring electronics with her and so not functional -Cover with very sensitive-scale SSI including AC and qhs coverage;  10 units of basal insulin (Lantus)  Gastroparesis -UTI likely exacerbated gastroparesis - Reglan for now, 10mg  IV qh prn -Zostrix as needed  HTN -Continue labetalol -Consider addition of ACE/ARB given DM but currently with AKI so defer to outpatient  Marijuana abuse -Cessation encouraged; this should be encouraged on an ongoing basis    Family Communication/Anticipated D/C date and plan/Code Status   Code Status: Full Code.  Disposition Plan: Status is: inpt    Dispo: The patient is from: Home              Anticipated d/c is to: Home              Anticipated d/c date is: 1 day              Patient currently is not medically stable to d/c. needs to be hydrated and able to tolerate PO w/o  vomiting         Medical Consultants:    None.     Subjective:   Threw up yesterday Having pain and nausea today  Objective:    Vitals:   01/14/20 0410 01/14/20 0946 01/14/20 2034 01/15/20 0450  BP: (!) 163/83 123/66 137/68 (!) 147/67  Pulse: 91 93 86 88  Resp:  16 16 20   Temp: 98 F (36.7 C) 98.6 F (37 C) 97.6 F (36.4 C) 98.9 F (37.2 C)  TempSrc: Oral Oral  Oral  SpO2: 100% 95% 100% 100%  Weight:      Height:        Intake/Output Summary (Last 24 hours) at 01/15/2020 1009 Last data filed at 01/15/2020 01/17/2020 Gross per 24 hour  Intake 1091.7 ml  Output -  Net 1091.7 ml   Filed Weights   01/13/20 0755  Weight: 56.2 kg    Exam:  General: Appearance:    Well developed, well nourished female who appears mildly uncomfortable   Now with CVA tenderness  Lungs:     respirations unlabored  Heart:    Normal heart rate. Normal rhythm. No murmurs, rubs, or gallops.   MS:   All extremities are intact.   Neurologic:   Awake, alert, oriented x 3. No apparent focal neurological           defect.      Data Reviewed:  I have personally reviewed following labs and imaging studies:  Labs: Labs show the following:   Basic Metabolic Panel: Recent Labs  Lab 01/13/20 0754 01/14/20 0401 01/14/20 1522 01/15/20 0913  NA 134* 127* 133* 137  K 3.7 4.5 3.9 3.9  CL 100 98 102 106  CO2 20* 19* 20* 21*  GLUCOSE 304* 548* 166* 100*  BUN 15 22* 23* 16  CREATININE 1.21* 1.52* 1.58* 1.34*  CALCIUM 8.9 8.2* 8.4* 8.4*   GFR Estimated Creatinine Clearance: 52.5 mL/min (A) (by C-G formula based on SCr of 1.34 mg/dL (H)). Liver Function Tests: Recent Labs  Lab 01/13/20 0754  AST 18  ALT 13  ALKPHOS 88  BILITOT 0.7  PROT 7.1  ALBUMIN 3.8   Recent Labs  Lab 01/13/20 0754  LIPASE 22   No results for input(s): AMMONIA in the last 168 hours. Coagulation profile No results for input(s): INR, PROTIME in the last 168 hours.  CBC: Recent Labs  Lab  01/13/20 0754 01/14/20 0401 01/15/20 0913  WBC 12.3* 13.3* 9.3  HGB 11.9* 10.3* 10.4*  HCT 36.3 30.2* 33.1*  MCV 81.9 80.1 82.5  PLT 258 205 171   Cardiac Enzymes: No results for input(s): CKTOTAL, CKMB, CKMBINDEX, TROPONINI in the last 168 hours. BNP (last 3 results) No results for input(s): PROBNP in the last 8760 hours. CBG: Recent Labs  Lab 01/14/20 1224 01/14/20 2129 01/15/20 0357 01/15/20 0805 01/15/20 0855  GLUCAP 89 238* 71 127* 103*   D-Dimer: No results for input(s): DDIMER in the last 72 hours. Hgb A1c: Recent Labs    01/13/20 1727  HGBA1C 8.2*   Lipid Profile: No results for input(s): CHOL, HDL, LDLCALC, TRIG, CHOLHDL, LDLDIRECT in the last 72 hours. Thyroid function studies: Recent Labs    01/13/20 1727  TSH 0.568   Anemia work up: No results for input(s): VITAMINB12, FOLATE, FERRITIN, TIBC, IRON, RETICCTPCT in the last 72 hours. Sepsis Labs: Recent Labs  Lab 01/13/20 0754 01/14/20 0401 01/15/20 0913  WBC 12.3* 13.3* 9.3    Microbiology Recent Results (from the past 240 hour(s))  Urine Culture     Status: Abnormal   Collection Time: 01/13/20  1:53 PM   Specimen: Urine, Random  Result Value Ref Range Status   Specimen Description URINE, RANDOM  Final   Special Requests   Final    NONE Performed at [facility], [address], [place], [place] [address]    Culture >=100,000 COLONIES/mL ESCHERICHIA COLI (A)  Final   Report Status 01/15/2020 FINAL  Final   Organism ID, Bacteria ESCHERICHIA COLI (A)  Final      Susceptibility   Escherichia coli - MIC*    AMPICILLIN >=32 RESISTANT Resistant     CEFAZOLIN <=4 SENSITIVE Sensitive     CEFEPIME <=0.12 SENSITIVE Sensitive     CEFTRIAXONE <=0.25 SENSITIVE Sensitive     CIPROFLOXACIN <=0.25 SENSITIVE Sensitive     GENTAMICIN <=1 SENSITIVE Sensitive     IMIPENEM <=0.25 SENSITIVE Sensitive     NITROFURANTOIN <=16 SENSITIVE Sensitive     TRIMETH/SULFA <=20 SENSITIVE Sensitive      AMPICILLIN/SULBACTAM 16 INTERMEDIATE Intermediate     PIP/TAZO <=4 SENSITIVE Sensitive     * >=100,000 COLONIES/mL ESCHERICHIA COLI  Resp Panel by RT-PCR (Flu A&B, Covid) Nasopharyngeal Swab     Status: None   Collection Time: 01/13/20  9:32 PM   Specimen: Nasopharyngeal Swab; Nasopharyngeal(NP) swabs in vial transport medium  Result Value Ref Range Status   SARS  Coronavirus 2 by RT PCR NEGATIVE NEGATIVE Final    Comment: (NOTE) SARS-CoV-2 target nucleic acids are NOT DETECTED.  The SARS-CoV-2 RNA is generally detectable in upper respiratory specimens during the acute phase of infection. The lowest concentration of SARS-CoV-2 viral copies this assay can detect is 138 copies/mL. A negative result does not preclude SARS-Cov-2 infection and should not be used as the sole basis for treatment or other patient management decisions. A negative result may occur with  improper specimen collection/handling, submission of specimen other than nasopharyngeal swab, presence of viral mutation(s) within the areas targeted by this assay, and inadequate number of viral copies(<138 copies/mL). A negative result must be combined with clinical observations, patient history, and epidemiological information. The expected result is Negative.  Fact Sheet for Patients:  BloggerCourse.comhttps://www.fda.gov/media/152166/download  Fact Sheet for Healthcare Providers:  SeriousBroker.ithttps://www.fda.gov/media/152162/download  This test is no t yet approved or cleared by the [place] FDA and  has been authorized for detection and/or diagnosis of SARS-CoV-2 by FDA under an Emergency Use Authorization (EUA). This EUA will remain  in effect (meaning this test can be used) for the duration of the COVID-19 declaration under Section 564(b)(1) of the Act, 21 U.S.C.section 360bbb-3(b)(1), unless the authorization is terminated  or revoked sooner.       Influenza A by PCR NEGATIVE NEGATIVE Final   Influenza B by PCR NEGATIVE NEGATIVE Final     Comment: (NOTE) The Xpert Xpress SARS-CoV-2/FLU/RSV plus assay is intended as an aid in the diagnosis of influenza from Nasopharyngeal swab specimens and should not be used as a sole basis for treatment. Nasal washings and aspirates are unacceptable for Xpert Xpress SARS-CoV-2/FLU/RSV testing.  Fact Sheet for Patients: BloggerCourse.comhttps://www.fda.gov/media/152166/download  Fact Sheet for Healthcare Providers: SeriousBroker.ithttps://www.fda.gov/media/152162/download  This test is not yet approved or cleared by the [place] FDA and has been authorized for detection and/or diagnosis of SARS-CoV-2 by FDA under an Emergency Use Authorization (EUA). This EUA will remain in effect (meaning this test can be used) for the duration of the COVID-19 declaration under Section 564(b)(1) of the Act, 21 U.S.C. section 360bbb-3(b)(1), unless the authorization is terminated or revoked.  Performed at [facility], [address] [place], [place] [address]     Procedures and diagnostic studies:  CT ABDOMEN PELVIS W CONTRAST  Result Date: 01/13/2020 CLINICAL DATA:  Abdominal pain, nausea, vomiting and headache. Hypertension. History of gastroparesis. EXAM: CT ABDOMEN AND PELVIS WITH CONTRAST TECHNIQUE: Multidetector CT imaging of the abdomen and pelvis was performed using the standard protocol following bolus administration of intravenous contrast. CONTRAST:  100 mL OMNIPAQUE IOHEXOL 300 MG/ML  SOLN COMPARISON:  None. FINDINGS: Lower chest: Lung bases are clear. No pleural or pericardial effusion. Hepatobiliary: No focal liver abnormality is seen. No gallstones, gallbladder wall thickening, or biliary dilatation. Pancreas: Unremarkable. No pancreatic ductal dilatation or surrounding inflammatory changes. Spleen: Normal in size without focal abnormality. Adrenals/Urinary Tract: The adrenal glands appear normal. The patient has mild right hydronephrosis. No urinary tract stones are identified. Tiny cyst in the upper  pole of the right kidney is noted. The left kidney appears normal. The urinary bladder is incompletely distended and unremarkable in appearance. Stomach/Bowel: Stomach is within normal limits. Gastric stimulator noted. The appendix is not seen but no evidence of appendicitis is identified. No evidence of bowel wall thickening, distention, or inflammatory changes. Vascular/Lymphatic: No significant vascular findings are present. No enlarged abdominal or pelvic lymph nodes. Reproductive: Uterus and bilateral adnexa are unremarkable. Other: Small volume of  free pelvic fluid is greater than typically seen in physiologic change. Musculoskeletal: Negative. IMPRESSION: Mild right hydronephrosis without stone or other obstructing lesion identified. Small volume of free pelvic fluid is greater than typically seen in physiologic change but the cause for the fluid is not identified. Electronically Signed   By: Drusilla Kanner M.D.   On: 01/13/2020 15:13    Medications:   . capsaicin   Topical BID  . docusate sodium  100 mg Oral BID  . enoxaparin (LOVENOX) injection  40 mg Subcutaneous Q24H  . feeding supplement (GLUCERNA SHAKE)  237 mL Oral BID WC  . insulin aspart  0-5 Units Subcutaneous QHS  . insulin aspart  0-6 Units Subcutaneous TID WC  . insulin glargine  10 Units Subcutaneous QHS  . labetalol  600 mg Oral TID   Continuous Infusions: . cefTRIAXone (ROCEPHIN)  IV 1 g (01/14/20 1203)  . lactated ringers 75 mL/hr at 01/15/20 0309     LOS: 1 day   Joseph Art  [facility]   How to contact the [facility] Attending or Consulting provider 7A - 7P or covering provider during after hours 7P -7A, for this patient?  1. Check the care team in [facility] and look for a) attending/consulting [facility] provider listed and b) the [facility] team listed 2. Log into [email or web address] and use [place]'s universal password to access. If you do not have the password, please contact the hospital operator. 3. Locate the [facility] provider  you are looking for under [facility] and page to a number that you can be directly reached. 4. If you still have difficulty reaching the provider, please page the [facility] (Director on Call) for the Hospitalists listed on amion for assistance.  01/15/2020, 10:09 AM

## 2020-01-16 LAB — GLUCOSE, CAPILLARY
Glucose-Capillary: 27 mg/dL — CL (ref 70–99)
Glucose-Capillary: 60 mg/dL — ABNORMAL LOW (ref 70–99)
Glucose-Capillary: 119 mg/dL — ABNORMAL HIGH (ref 70–99)
Glucose-Capillary: 257 mg/dL — ABNORMAL HIGH (ref 70–99)
Glucose-Capillary: 14 mg/dL — CL (ref 70–99)
Glucose-Capillary: 163 mg/dL — ABNORMAL HIGH (ref 70–99)
Glucose-Capillary: 19 mg/dL — CL (ref 70–99)
Glucose-Capillary: 218 mg/dL — ABNORMAL HIGH (ref 70–99)
Glucose-Capillary: 231 mg/dL — ABNORMAL HIGH (ref 70–99)

## 2020-01-16 LAB — BASIC METABOLIC PANEL
Anion gap: 8 (ref 5–15)
BUN: 11 mg/dL (ref 6–20)
CO2: 24 mmol/L (ref 22–32)
Calcium: 8.4 mg/dL — ABNORMAL LOW (ref 8.9–10.3)
Chloride: 105 mmol/L (ref 98–111)
Creatinine, Ser: 1.26 mg/dL — ABNORMAL HIGH (ref 0.44–1.00)
GFR, Estimated: 57 mL/min — ABNORMAL LOW (ref >60)
Glucose, Bld: 126 mg/dL — ABNORMAL HIGH (ref 70–99)
Potassium: 3.7 mmol/L (ref 3.5–5.1)
Sodium: 137 mmol/L (ref 135–145)

## 2020-01-16 MED ORDER — DEXTROSE 50 % IV SOLN
25.0000 g | Freq: Once | INTRAVENOUS | Status: AC
  Administered 2020-01-16: 25 g via INTRAVENOUS

## 2020-01-16 MED ORDER — ACETAMINOPHEN 325 MG PO TABS: 650.0000 mg | ORAL_TABLET | Freq: Four times a day (QID) | ORAL | Status: AC | PRN

## 2020-01-16 MED ORDER — HYDROCODONE-ACETAMINOPHEN 5-325 MG PO TABS: 1.0000 | ORAL_TABLET | Freq: Four times a day (QID) | ORAL | 0 refills | Status: DC | PRN

## 2020-01-16 MED ORDER — CEFDINIR 300 MG PO CAPS
300.0000 mg | ORAL_CAPSULE | Freq: Two times a day (BID) | ORAL | Status: DC
  Administered 2020-01-16: 300 mg via ORAL
  Filled 2020-01-16 (×2): qty 1

## 2020-01-16 MED ORDER — ONDANSETRON HCL 4 MG PO TABS: 4.0000 mg | ORAL_TABLET | Freq: Four times a day (QID) | ORAL | 0 refills | Status: DC | PRN

## 2020-01-16 MED ORDER — CEFDINIR 300 MG PO CAPS: 300.0000 mg | ORAL_CAPSULE | Freq: Two times a day (BID) | ORAL | 0 refills | Status: DC

## 2020-01-16 NOTE — Progress Notes (Signed)
Hypoglycemic Event  CBG: 27 at 0128  Treatment: 1 can of soda, ice cream and crackers  Symptoms: hungry and sweaty  Follow-up CBG: Time:0147 CBG Result:60  Possible Reasons for Event: low food intake  Comments/MD notified: Marikay Alar   0147: Pt's blood sugar at 60, offered pt to eat some more per, protocol, pt refused to eat more. Notified MD on call.   0200 Blood sugar went down to 35, D50 25g given per protocol. Notified MD on call.  0257 Blood sugar is up now to 119. Pt remains stable.      Trudee Grip D Ferdie Bakken

## 2020-01-16 NOTE — Discharge Summary (Signed)
Physician Discharge Summary  Tamara Lane ZOX:[id number] DOB: 12-21-1985 DOA: 01/13/2020  PCP: Patient, No Pcp Per  Admit date: 01/13/2020 Discharge date: 01/16/2020  Admitted From: home Discharge disposition: home   Recommendations for Outpatient Follow-Up:   1. marijuana cessation 2. consider addition of ACE as an outpateint 3. BMP 1 week   Discharge Diagnosis:   Principal Problem:   Cystitis Active Problems:   Type 1 diabetes mellitus with hyperglycemia, with long-term current use of insulin (HCC)   Diabetic gastroparesis (HCC)   Essential hypertension   Marijuana abuse   Pyelonephritis    Discharge Condition: Improved.  Diet recommendation: Low sodium, heart healthy.  Carbohydrate-modified.   Wound care: None.  Code status: Full.   History of Present Illness:   Tamara KaufmannDuvale Skarda is a 34 y.o. female with medical history significant of DM with gastroparesis s/p gastric stimulator that was removed in June; HTN; and CKD presenting with n/v.  She started with a headache, trying to sleep it off.  She woke up and vomited and then her headache worsened and stomach felt like it was spasming.  She had polyuria, no dysuria but suprapubic pain.  No flank pain.  No fever, did have chills.  Pump is not on currently, uses Omnipod and left it at home.  Sugars have been generally in 200 range.  She suffered fetal demise at 7 months last year and also recently lost her mom.  Denies depression.   Hospital Course by Problem:   Gram negative UTI- ecoli -Patient presenting without sepsis physiology but with symptoms concerning for UTI- suprapubic pain, urinary frequency -Today's UA appears to demonstrate infection -CT with R hydronephrosis without apparent stone or obstruction; no clinical pyelo -IV abx changed to PO to treat for 7 days  AKI -improved  psuedo-hyponatremia -resolved  DM- type 1 -resume home meds  Gastroparesis -UTI likely exacerbated  gastroparesis  HTN -Continue labetalol -Consider addition of ACE/ARB given DM but currently with AKI so defer to outpatient  Marijuana abuse -Cessation encouraged; this should be encouraged on an ongoing basis    Medical Consultants:      Discharge Exam:   Vitals:   01/16/20 0215 01/16/20 1001  BP: (!) 150/95 (!) 179/77  Pulse: 95 86  Resp: 18   Temp: 98.8 F (37.1 C)   SpO2: 98%    Vitals:   01/15/20 1955 01/15/20 2200 01/16/20 0215 01/16/20 1001  BP: (!) 172/87 (!) 155/85 (!) 150/95 (!) 179/77  Pulse: 90 90 95 86  Resp: 18  18   Temp: 98.8 F (37.1 C)  98.8 F (37.1 C)   TempSrc: Oral     SpO2: 100%  98%   Weight:      Height:        General exam: Appears calm and comfortable.   The results of significant diagnostics from this hospitalization (including imaging, microbiology, ancillary and laboratory) are listed below for reference.     Procedures and Diagnostic Studies:   CT ABDOMEN PELVIS W CONTRAST  Result Date: 01/13/2020 CLINICAL DATA:  Abdominal pain, nausea, vomiting and headache. Hypertension. History of gastroparesis. EXAM: CT ABDOMEN AND PELVIS WITH CONTRAST TECHNIQUE: Multidetector CT imaging of the abdomen and pelvis was performed using the standard protocol following bolus administration of intravenous contrast. CONTRAST:  100 mL OMNIPAQUE IOHEXOL 300 MG/ML  SOLN COMPARISON:  None. FINDINGS: Lower chest: Lung bases are clear. No pleural or pericardial effusion. Hepatobiliary: No focal liver abnormality is seen. No gallstones, gallbladder wall  thickening, or biliary dilatation. Pancreas: Unremarkable. No pancreatic ductal dilatation or surrounding inflammatory changes. Spleen: Normal in size without focal abnormality. Adrenals/Urinary Tract: The adrenal glands appear normal. The patient has mild right hydronephrosis. No urinary tract stones are identified. Tiny cyst in the upper pole of the right kidney is noted. The left kidney appears normal. The  urinary bladder is incompletely distended and unremarkable in appearance. Stomach/Bowel: Stomach is within normal limits. Gastric stimulator noted. The appendix is not seen but no evidence of appendicitis is identified. No evidence of bowel wall thickening, distention, or inflammatory changes. Vascular/Lymphatic: No significant vascular findings are present. No enlarged abdominal or pelvic lymph nodes. Reproductive: Uterus and bilateral adnexa are unremarkable. Other: Small volume of free pelvic fluid is greater than typically seen in physiologic change. Musculoskeletal: Negative. IMPRESSION: Mild right hydronephrosis without stone or other obstructing lesion identified. Small volume of free pelvic fluid is greater than typically seen in physiologic change but the cause for the fluid is not identified. Electronically Signed   By: Drusilla Kanner M.D.   On: 01/13/2020 15:13     Labs:   Basic Metabolic Panel: Recent Labs  Lab 01/13/20 0754 01/14/20 0401 01/14/20 1522 01/15/20 0913 01/16/20 0026  NA 134* 127* 133* 137 137  K 3.7 4.5 3.9 3.9 3.7  CL 100 98 102 106 105  CO2 20* 19* 20* 21* 24  GLUCOSE 304* 548* 166* 100* 126*  BUN 15 22* 23* 16 11  CREATININE 1.21* 1.52* 1.58* 1.34* 1.26*  CALCIUM 8.9 8.2* 8.4* 8.4* 8.4*   GFR Estimated Creatinine Clearance: 55.8 mL/min (A) (by C-G formula based on SCr of 1.26 mg/dL (H)). Liver Function Tests: Recent Labs  Lab 01/13/20 0754  AST 18  ALT 13  ALKPHOS 88  BILITOT 0.7  PROT 7.1  ALBUMIN 3.8   Recent Labs  Lab 01/13/20 0754  LIPASE 22   No results for input(s): AMMONIA in the last 168 hours. Coagulation profile No results for input(s): INR, PROTIME in the last 168 hours.  CBC: Recent Labs  Lab 01/13/20 0754 01/14/20 0401 01/15/20 0913  WBC 12.3* 13.3* 9.3  HGB 11.9* 10.3* 10.4*  HCT 36.3 30.2* 33.1*  MCV 81.9 80.1 82.5  PLT 258 205 171   Cardiac Enzymes: No results for input(s): CKTOTAL, CKMB, CKMBINDEX, TROPONINI in  the last 168 hours. BNP: Invalid input(s): POCBNP CBG: Recent Labs  Lab 01/16/20 0128 01/16/20 0147 01/16/20 0257 01/16/20 0954 01/16/20 1150  GLUCAP 27* 60* 119* 257* 163*   D-Dimer No results for input(s): DDIMER in the last 72 hours. Hgb A1c Recent Labs    01/13/20 1727  HGBA1C 8.2*   Lipid Profile No results for input(s): CHOL, HDL, LDLCALC, TRIG, CHOLHDL, LDLDIRECT in the last 72 hours. Thyroid function studies Recent Labs    01/13/20 1727  TSH 0.568   Anemia work up No results for input(s): VITAMINB12, FOLATE, FERRITIN, TIBC, IRON, RETICCTPCT in the last 72 hours. Microbiology Recent Results (from the past 240 hour(s))  Urine Culture     Status: Abnormal   Collection Time: 01/13/20  1:53 PM   Specimen: Urine, Random  Result Value Ref Range Status   Specimen Description URINE, RANDOM  Final   Special Requests   Final    NONE Performed at [facility], [address] [place], [place] [address]    Culture >=100,000 COLONIES/mL ESCHERICHIA COLI (A)  Final   Report Status 01/15/2020 FINAL  Final   Organism ID, Bacteria ESCHERICHIA COLI (A)  Final      Susceptibility   Escherichia coli - MIC*    AMPICILLIN >=32 RESISTANT Resistant     CEFAZOLIN <=4 SENSITIVE Sensitive     CEFEPIME <=0.12 SENSITIVE Sensitive     CEFTRIAXONE <=0.25 SENSITIVE Sensitive     CIPROFLOXACIN <=0.25 SENSITIVE Sensitive     GENTAMICIN <=1 SENSITIVE Sensitive     IMIPENEM <=0.25 SENSITIVE Sensitive     NITROFURANTOIN <=16 SENSITIVE Sensitive     TRIMETH/SULFA <=20 SENSITIVE Sensitive     AMPICILLIN/SULBACTAM 16 INTERMEDIATE Intermediate     PIP/TAZO <=4 SENSITIVE Sensitive     * >=100,000 COLONIES/mL ESCHERICHIA COLI  Resp Panel by RT-PCR (Flu A&B, Covid) Nasopharyngeal Swab     Status: None   Collection Time: 01/13/20  9:32 PM   Specimen: Nasopharyngeal Swab; Nasopharyngeal(NP) swabs in vial transport medium  Result Value Ref Range Status   SARS Coronavirus 2 by RT PCR  NEGATIVE NEGATIVE Final    Comment: (NOTE) SARS-CoV-2 target nucleic acids are NOT DETECTED.  The SARS-CoV-2 RNA is generally detectable in upper respiratory specimens during the acute phase of infection. The lowest concentration of SARS-CoV-2 viral copies this assay can detect is 138 copies/mL. A negative result does not preclude SARS-Cov-2 infection and should not be used as the sole basis for treatment or other patient management decisions. A negative result may occur with  improper specimen collection/handling, submission of specimen other than nasopharyngeal swab, presence of viral mutation(s) within the areas targeted by this assay, and inadequate number of viral copies(<138 copies/mL). A negative result must be combined with clinical observations, patient history, and epidemiological information. The expected result is Negative.  Fact Sheet for Patients:  BloggerCourse.com  Fact Sheet for Healthcare Providers:  SeriousBroker.it  This test is no t yet approved or cleared by the [place] FDA and  has been authorized for detection and/or diagnosis of SARS-CoV-2 by FDA under an Emergency Use Authorization (EUA). This EUA will remain  in effect (meaning this test can be used) for the duration of the COVID-19 declaration under Section 564(b)(1) of the Act, 21 U.S.C.section 360bbb-3(b)(1), unless the authorization is terminated  or revoked sooner.       Influenza A by PCR NEGATIVE NEGATIVE Final   Influenza B by PCR NEGATIVE NEGATIVE Final    Comment: (NOTE) The Xpert Xpress SARS-CoV-2/FLU/RSV plus assay is intended as an aid in the diagnosis of influenza from Nasopharyngeal swab specimens and should not be used as a sole basis for treatment. Nasal washings and aspirates are unacceptable for Xpert Xpress SARS-CoV-2/FLU/RSV testing.  Fact Sheet for Patients: BloggerCourse.com  Fact Sheet for  Healthcare Providers: SeriousBroker.it  This test is not yet approved or cleared by the [place] FDA and has been authorized for detection and/or diagnosis of SARS-CoV-2 by FDA under an Emergency Use Authorization (EUA). This EUA will remain in effect (meaning this test can be used) for the duration of the COVID-19 declaration under Section 564(b)(1) of the Act, 21 U.S.C. section 360bbb-3(b)(1), unless the authorization is terminated or revoked.  Performed at [facility], [address], [place], [place] [address]      Discharge Instructions:    Allergies as of 01/16/2020      Reactions   Penicillins Hives   Did it involve swelling of the face/tongue/throat, SOB, or low BP? N Did it involve sudden or severe rash/hives, skin peeling, or any reaction on the inside of your mouth or nose? Y Did you need to seek medical  attention at a hospital or doctor's office? Unknown When did it last happen?childhood If all above answers are "NO", may proceed with cephalosporin use.   Methocarbamol Nausea And Vomiting, Other (See Comments)   Body shaking Body shaking, nausea   Azithromycin Hives      Medication List    STOP taking these medications   ibuprofen 200 MG tablet Commonly known as: ADVIL     TAKE these medications   acetaminophen 325 MG tablet Commonly known as: TYLENOL Take 2 tablets (650 mg total) by mouth every 6 (six) hours as needed for mild pain (or Fever >/= 101).   cefdinir 300 MG capsule Commonly known as: OMNICEF Take 1 capsule (300 mg total) by mouth every 12 (twelve) hours.   HYDROcodone-acetaminophen 5-325 MG tablet Commonly known as: NORCO/VICODIN Take 1 tablet by mouth every 6 (six) hours as needed for moderate pain.   insulin aspart 100 UNIT/ML FlexPen Commonly known as: NOVOLOG Inject 1-10 Units into the skin 4 (four) times daily -  with meals and at bedtime. Sliding scale   labetalol 300 MG  tablet Commonly known as: NORMODYNE Take 600 mg by mouth 3 (three) times daily.   ondansetron 4 MG tablet Commonly known as: ZOFRAN Take 1 tablet (4 mg total) by mouth every 6 (six) hours as needed for nausea.         Time coordinating discharge: 35 min  Signed:  Joseph Art DO  [facility] 01/16/2020, 1:46 PM

## 2020-01-16 NOTE — Plan of Care (Signed)
Problem: Education: Goal: Knowledge of General Education information will improve Description: Including pain rating scale, medication(s)/side effects and non-pharmacologic comfort measures 01/16/2020 1133 by Sandford Craze, RN Outcome: Progressing 01/16/2020 1133 by Sandford Craze, RN Outcome: Progressing   Problem: Health Behavior/Discharge Planning: Goal: Ability to manage health-related needs will improve 01/16/2020 1133 by Sandford Craze, RN Outcome: Progressing 01/16/2020 1133 by Sandford Craze, RN Outcome: Progressing   Problem: Clinical Measurements: Goal: Ability to maintain clinical measurements within normal limits will improve 01/16/2020 1133 by Sandford Craze, RN Outcome: Progressing 01/16/2020 1133 by Sandford Craze, RN Outcome: Progressing Goal: Will remain free from infection 01/16/2020 1133 by Sandford Craze, RN Outcome: Progressing 01/16/2020 1133 by Sandford Craze, RN Outcome: Progressing Goal: Diagnostic test results will improve 01/16/2020 1133 by Sandford Craze, RN Outcome: Progressing 01/16/2020 1133 by Sandford Craze, RN Outcome: Progressing Goal: Respiratory complications will improve 01/16/2020 1133 by Sandford Craze, RN Outcome: Progressing 01/16/2020 1133 by Sandford Craze, RN Outcome: Progressing Goal: Cardiovascular complication will be avoided 01/16/2020 1133 by Sandford Craze, RN Outcome: Progressing 01/16/2020 1133 by Sandford Craze, RN Outcome: Progressing   Problem: Activity: Goal: Risk for activity intolerance will decrease 01/16/2020 1133 by Sandford Craze, RN Outcome: Progressing 01/16/2020 1133 by Sandford Craze, RN Outcome: Progressing   Problem: Nutrition: Goal: Adequate nutrition will be maintained 01/16/2020 1133 by Sandford Craze, RN Outcome: Progressing 01/16/2020 1133 by Sandford Craze, RN Outcome: Progressing   Problem: Coping: Goal: Level of anxiety will decrease 01/16/2020 1133 by Sandford Craze, RN Outcome: Progressing 01/16/2020 1133 by Sandford Craze, RN Outcome: Progressing   Problem: Elimination: Goal: Will not experience complications related to bowel motility 01/16/2020 1133 by Sandford Craze, RN Outcome: Progressing 01/16/2020 1133 by Sandford Craze, RN Outcome: Progressing Goal: Will not experience complications related to urinary retention 01/16/2020 1133 by Sandford Craze, RN Outcome: Progressing 01/16/2020 1133 by Sandford Craze, RN Outcome: Progressing   Problem: Pain Managment: Goal: General experience of comfort will improve 01/16/2020 1133 by Sandford Craze, RN Outcome: Progressing 01/16/2020 1133 by Sandford Craze, RN Outcome: Progressing   Problem: Safety: Goal: Ability to remain free from injury will improve 01/16/2020 1133 by Sandford Craze, RN Outcome: Progressing 01/16/2020 1133 by Sandford Craze, RN Outcome: Progressing   Problem: Skin Integrity: Goal: Risk for impaired skin integrity will decrease 01/16/2020 1133 by Sandford Craze, RN Outcome: Progressing 01/16/2020 1133 by Sandford Craze, RN Outcome: Progressing   Problem: Education: Goal: Ability to describe self-care measures that may prevent or decrease complications (Diabetes Survival Skills Education) will improve Outcome: Progressing Goal: Individualized Educational Video(s) Outcome: Progressing   Problem: Coping: Goal: Ability to adjust to condition or change in health will improve Outcome: Progressing   Problem: Fluid Volume: Goal: Ability to maintain a balanced intake and output will improve Outcome: Progressing   Problem: Health Behavior/Discharge Planning: Goal: Ability to identify and utilize available resources and services will improve Outcome: Progressing Goal: Ability to manage health-related needs will improve Outcome: Progressing   Problem: Metabolic: Goal: Ability to maintain appropriate glucose levels will improve Outcome:  Progressing   Problem: Nutritional: Goal: Maintenance of adequate nutrition will improve Outcome: Progressing Goal: Progress toward achieving an optimal weight will improve Outcome: Progressing   Problem: Skin Integrity: Goal: Risk for impaired skin integrity will decrease Outcome: Progressing   Problem: Tissue Perfusion: Goal: Adequacy of tissue perfusion will improve Outcome:  Progressing

## 2020-01-17 LAB — GLUCOSE, CAPILLARY: Glucose-Capillary: 200 mg/dL — ABNORMAL HIGH (ref 70–99)

## 2020-02-23 ENCOUNTER — Inpatient Hospital Stay (HOSPITAL_COMMUNITY)
Admission: EM | Admit: 2020-02-23 | Discharge: 2020-02-25 | DRG: 638 | Disposition: A | Discharge status: Home / Self Care | Payer: Medicare Other | Attending: Internal Medicine | Admitting: Internal Medicine

## 2020-02-23 ENCOUNTER — Other Ambulatory Visit: Payer: Self-pay

## 2020-02-23 DIAGNOSIS — K3184 Gastroparesis: Secondary | ICD-10-CM | POA: Diagnosis not present

## 2020-02-23 DIAGNOSIS — N1831 Chronic kidney disease, stage 3a: Secondary | ICD-10-CM | POA: Diagnosis present

## 2020-02-23 DIAGNOSIS — E1143 Type 2 diabetes mellitus with diabetic autonomic (poly)neuropathy: Secondary | ICD-10-CM | POA: Diagnosis not present

## 2020-02-23 DIAGNOSIS — Z833 Family history of diabetes mellitus: Secondary | ICD-10-CM | POA: Diagnosis not present

## 2020-02-23 DIAGNOSIS — I129 Hypertensive chronic kidney disease with stage 1 through stage 4 chronic kidney disease, or unspecified chronic kidney disease: Secondary | ICD-10-CM | POA: Diagnosis present

## 2020-02-23 DIAGNOSIS — N179 Acute kidney failure, unspecified: Secondary | ICD-10-CM

## 2020-02-23 DIAGNOSIS — E1065 Type 1 diabetes mellitus with hyperglycemia: Secondary | ICD-10-CM | POA: Diagnosis present

## 2020-02-23 DIAGNOSIS — D6489 Other specified anemias: Secondary | ICD-10-CM | POA: Diagnosis present

## 2020-02-23 DIAGNOSIS — Z794 Long term (current) use of insulin: Secondary | ICD-10-CM

## 2020-02-23 DIAGNOSIS — E10649 Type 1 diabetes mellitus with hypoglycemia without coma: Secondary | ICD-10-CM | POA: Diagnosis not present

## 2020-02-23 DIAGNOSIS — D72828 Other elevated white blood cell count: Secondary | ICD-10-CM | POA: Diagnosis present

## 2020-02-23 DIAGNOSIS — E1010 Type 1 diabetes mellitus with ketoacidosis without coma: Principal | ICD-10-CM

## 2020-02-23 DIAGNOSIS — D72829 Elevated white blood cell count, unspecified: Secondary | ICD-10-CM

## 2020-02-23 DIAGNOSIS — Z20822 Contact with and (suspected) exposure to covid-19: Secondary | ICD-10-CM | POA: Diagnosis present

## 2020-02-23 DIAGNOSIS — E1043 Type 1 diabetes mellitus with diabetic autonomic (poly)neuropathy: Secondary | ICD-10-CM | POA: Diagnosis present

## 2020-02-23 DIAGNOSIS — I161 Hypertensive emergency: Secondary | ICD-10-CM | POA: Diagnosis not present

## 2020-02-23 DIAGNOSIS — F1210 Cannabis abuse, uncomplicated: Secondary | ICD-10-CM | POA: Diagnosis not present

## 2020-02-23 DIAGNOSIS — E860 Dehydration: Secondary | ICD-10-CM | POA: Diagnosis present

## 2020-02-23 DIAGNOSIS — E1022 Type 1 diabetes mellitus with diabetic chronic kidney disease: Secondary | ICD-10-CM | POA: Diagnosis present

## 2020-02-23 LAB — BASIC METABOLIC PANEL
Anion gap: 15 (ref 5–15)
Anion gap: 10 (ref 5–15)
Anion gap: 10 (ref 5–15)
Anion gap: 12 (ref 5–15)
Anion gap: 9 (ref 5–15)
BUN: 23 mg/dL — ABNORMAL HIGH (ref 6–20)
BUN: 19 mg/dL (ref 6–20)
BUN: 24 mg/dL — ABNORMAL HIGH (ref 6–20)
BUN: 27 mg/dL — ABNORMAL HIGH (ref 6–20)
BUN: 27 mg/dL — ABNORMAL HIGH (ref 6–20)
CO2: 20 mmol/L — ABNORMAL LOW (ref 22–32)
CO2: 23 mmol/L (ref 22–32)
CO2: 23 mmol/L (ref 22–32)
CO2: 19 mmol/L — ABNORMAL LOW (ref 22–32)
CO2: 21 mmol/L — ABNORMAL LOW (ref 22–32)
Calcium: 9.4 mg/dL (ref 8.9–10.3)
Calcium: 9.1 mg/dL (ref 8.9–10.3)
Calcium: 8.9 mg/dL (ref 8.9–10.3)
Calcium: 8.8 mg/dL — ABNORMAL LOW (ref 8.9–10.3)
Calcium: 8.8 mg/dL — ABNORMAL LOW (ref 8.9–10.3)
Chloride: 98 mmol/L (ref 98–111)
Chloride: 100 mmol/L (ref 98–111)
Chloride: 102 mmol/L (ref 98–111)
Chloride: 102 mmol/L (ref 98–111)
Chloride: 103 mmol/L (ref 98–111)
Creatinine, Ser: 1.52 mg/dL — ABNORMAL HIGH (ref 0.44–1.00)
Creatinine, Ser: 1.31 mg/dL — ABNORMAL HIGH (ref 0.44–1.00)
Creatinine, Ser: 1.92 mg/dL — ABNORMAL HIGH (ref 0.44–1.00)
Creatinine, Ser: 2.11 mg/dL — ABNORMAL HIGH (ref 0.44–1.00)
Creatinine, Ser: 2.16 mg/dL — ABNORMAL HIGH (ref 0.44–1.00)
GFR, Estimated: 46 mL/min — ABNORMAL LOW (ref >60)
GFR, Estimated: 55 mL/min — ABNORMAL LOW (ref >60)
GFR, Estimated: 35 mL/min — ABNORMAL LOW (ref >60)
GFR, Estimated: 30 mL/min — ABNORMAL LOW (ref >60)
GFR, Estimated: 31 mL/min — ABNORMAL LOW (ref >60)
Glucose, Bld: 405 mg/dL — ABNORMAL HIGH (ref 70–99)
Glucose, Bld: 251 mg/dL — ABNORMAL HIGH (ref 70–99)
Glucose, Bld: 212 mg/dL — ABNORMAL HIGH (ref 70–99)
Glucose, Bld: 279 mg/dL — ABNORMAL HIGH (ref 70–99)
Glucose, Bld: 284 mg/dL — ABNORMAL HIGH (ref 70–99)
Potassium: 4.1 mmol/L (ref 3.5–5.1)
Potassium: 4.2 mmol/L (ref 3.5–5.1)
Potassium: 4 mmol/L (ref 3.5–5.1)
Potassium: 3.9 mmol/L (ref 3.5–5.1)
Potassium: 3.9 mmol/L (ref 3.5–5.1)
Sodium: 133 mmol/L — ABNORMAL LOW (ref 135–145)
Sodium: 133 mmol/L — ABNORMAL LOW (ref 135–145)
Sodium: 135 mmol/L (ref 135–145)
Sodium: 133 mmol/L — ABNORMAL LOW (ref 135–145)
Sodium: 133 mmol/L — ABNORMAL LOW (ref 135–145)

## 2020-02-23 LAB — URINALYSIS, ROUTINE W REFLEX MICROSCOPIC
Bacteria, UA: NONE SEEN
Bilirubin Urine: NEGATIVE
Glucose, UA: >=500 mg/dL — AB
Hgb urine dipstick: NEGATIVE
Ketones, ur: 20 mg/dL — AB
Leukocytes,Ua: NEGATIVE
Nitrite: NEGATIVE
Protein, ur: 30 mg/dL — AB
Specific Gravity, Urine: 1.013 (ref 1.005–1.030)
pH: 7 (ref 5.0–8.0)

## 2020-02-23 LAB — BETA-HYDROXYBUTYRIC ACID
Beta-Hydroxybutyric Acid: 2.33 mmol/L — ABNORMAL HIGH (ref 0.05–0.27)
Beta-Hydroxybutyric Acid: 2 mmol/L — ABNORMAL HIGH (ref 0.05–0.27)
Beta-Hydroxybutyric Acid: 0.06 mmol/L (ref 0.05–0.27)
Beta-Hydroxybutyric Acid: 0.1 mmol/L (ref 0.05–0.27)

## 2020-02-23 LAB — GLUCOSE, CAPILLARY
Glucose-Capillary: 208 mg/dL — ABNORMAL HIGH (ref 70–99)
Glucose-Capillary: 213 mg/dL — ABNORMAL HIGH (ref 70–99)
Glucose-Capillary: 260 mg/dL — ABNORMAL HIGH (ref 70–99)

## 2020-02-23 LAB — CBC WITH DIFFERENTIAL/PLATELET
Abs Immature Granulocytes: 0.05 10*3/uL (ref 0.00–0.07)
Abs Immature Granulocytes: 0.09 10*3/uL — ABNORMAL HIGH (ref 0.00–0.07)
Basophils Absolute: 0.1 10*3/uL (ref 0.0–0.1)
Basophils Absolute: 0.1 10*3/uL (ref 0.0–0.1)
Basophils Relative: 0 %
Basophils Relative: 0 %
Eosinophils Absolute: 0 10*3/uL (ref 0.0–0.5)
Eosinophils Absolute: 0 10*3/uL (ref 0.0–0.5)
Eosinophils Relative: 0 %
Eosinophils Relative: 0 %
HCT: 31.4 % — ABNORMAL LOW (ref 36.0–46.0)
HCT: 35 % — ABNORMAL LOW (ref 36.0–46.0)
Hemoglobin: 10.5 g/dL — ABNORMAL LOW (ref 12.0–15.0)
Hemoglobin: 11 g/dL — ABNORMAL LOW (ref 12.0–15.0)
Immature Granulocytes: 0 %
Immature Granulocytes: 0 %
Lymphocytes Relative: 11 %
Lymphocytes Relative: 5 %
Lymphs Abs: 1.9 10*3/uL (ref 0.7–4.0)
Lymphs Abs: 1 10*3/uL (ref 0.7–4.0)
MCH: 27.3 pg (ref 26.0–34.0)
MCH: 26.2 pg (ref 26.0–34.0)
MCHC: 33.4 g/dL (ref 30.0–36.0)
MCHC: 31.4 g/dL (ref 30.0–36.0)
MCV: 81.8 fL (ref 80.0–100.0)
MCV: 83.3 fL (ref 80.0–100.0)
Monocytes Absolute: 0.8 10*3/uL (ref 0.1–1.0)
Monocytes Absolute: 0.4 10*3/uL (ref 0.1–1.0)
Monocytes Relative: 5 %
Monocytes Relative: 2 %
Neutro Abs: 13.8 10*3/uL — ABNORMAL HIGH (ref 1.7–7.7)
Neutro Abs: 20.7 10*3/uL — ABNORMAL HIGH (ref 1.7–7.7)
Neutrophils Relative %: 84 %
Neutrophils Relative %: 93 %
Platelets: 215 10*3/uL (ref 150–400)
Platelets: 267 10*3/uL (ref 150–400)
RBC: 3.84 MIL/uL — ABNORMAL LOW (ref 3.87–5.11)
RBC: 4.2 MIL/uL (ref 3.87–5.11)
RDW: 14.4 % (ref 11.5–15.5)
RDW: 14.3 % (ref 11.5–15.5)
WBC: 16.6 10*3/uL — ABNORMAL HIGH (ref 4.0–10.5)
WBC: 22.3 10*3/uL — ABNORMAL HIGH (ref 4.0–10.5)
nRBC: 0 % (ref 0.0–0.2)
nRBC: 0 % (ref 0.0–0.2)

## 2020-02-23 LAB — I-STAT VENOUS BLOOD GAS, ED
Acid-Base Excess: 0 mmol/L (ref 0.0–2.0)
Bicarbonate: 21.8 mmol/L (ref 20.0–28.0)
Calcium, Ion: 1.05 mmol/L — ABNORMAL LOW (ref 1.15–1.40)
HCT: 35 % — ABNORMAL LOW (ref 36.0–46.0)
Hemoglobin: 11.9 g/dL — ABNORMAL LOW (ref 12.0–15.0)
O2 Saturation: 100 %
Potassium: 4.4 mmol/L (ref 3.5–5.1)
Sodium: 131 mmol/L — ABNORMAL LOW (ref 135–145)
TCO2: 23 mmol/L (ref 22–32)
pCO2, Ven: 26.5 mmHg — ABNORMAL LOW (ref 44.0–60.0)
pH, Ven: 7.524 — ABNORMAL HIGH (ref 7.250–7.430)
pO2, Ven: 151 mmHg — ABNORMAL HIGH (ref 32.0–45.0)

## 2020-02-23 LAB — CBG MONITORING, ED
Glucose-Capillary: 566 mg/dL (ref 70–99)
Glucose-Capillary: 428 mg/dL — ABNORMAL HIGH (ref 70–99)
Glucose-Capillary: 331 mg/dL — ABNORMAL HIGH (ref 70–99)
Glucose-Capillary: 232 mg/dL — ABNORMAL HIGH (ref 70–99)
Glucose-Capillary: 234 mg/dL — ABNORMAL HIGH (ref 70–99)
Glucose-Capillary: 244 mg/dL — ABNORMAL HIGH (ref 70–99)
Glucose-Capillary: 266 mg/dL — ABNORMAL HIGH (ref 70–99)
Glucose-Capillary: 247 mg/dL — ABNORMAL HIGH (ref 70–99)
Glucose-Capillary: 240 mg/dL — ABNORMAL HIGH (ref 70–99)
Glucose-Capillary: 241 mg/dL — ABNORMAL HIGH (ref 70–99)
Glucose-Capillary: 157 mg/dL — ABNORMAL HIGH (ref 70–99)
Glucose-Capillary: 163 mg/dL — ABNORMAL HIGH (ref 70–99)
Glucose-Capillary: 165 mg/dL — ABNORMAL HIGH (ref 70–99)
Glucose-Capillary: 250 mg/dL — ABNORMAL HIGH (ref 70–99)
Glucose-Capillary: 327 mg/dL — ABNORMAL HIGH (ref 70–99)

## 2020-02-23 LAB — COMPREHENSIVE METABOLIC PANEL
ALT: 16 U/L (ref 0–44)
AST: 23 U/L (ref 15–41)
Albumin: 3.9 g/dL (ref 3.5–5.0)
Alkaline Phosphatase: 97 U/L (ref 38–126)
Anion gap: 18 — ABNORMAL HIGH (ref 5–15)
BUN: 20 mg/dL (ref 6–20)
CO2: 16 mmol/L — ABNORMAL LOW (ref 22–32)
Calcium: 9 mg/dL (ref 8.9–10.3)
Chloride: 96 mmol/L — ABNORMAL LOW (ref 98–111)
Creatinine, Ser: 1.5 mg/dL — ABNORMAL HIGH (ref 0.44–1.00)
GFR, Estimated: 47 mL/min — ABNORMAL LOW (ref >60)
Glucose, Bld: 581 mg/dL (ref 70–99)
Potassium: 4.5 mmol/L (ref 3.5–5.1)
Sodium: 130 mmol/L — ABNORMAL LOW (ref 135–145)
Total Bilirubin: 0.9 mg/dL (ref 0.3–1.2)
Total Protein: 7 g/dL (ref 6.5–8.1)

## 2020-02-23 LAB — I-STAT BETA HCG BLOOD, ED (MC, WL, AP ONLY): I-stat hCG, quantitative: <5 m[IU]/mL (ref <5)

## 2020-02-23 LAB — SARS CORONAVIRUS 2 (TAT 6-24 HRS): SARS Coronavirus 2: NEGATIVE

## 2020-02-23 MED ORDER — DIPHENHYDRAMINE HCL 50 MG/ML IJ SOLN
25.0000 mg | Freq: Four times a day (QID) | INTRAMUSCULAR | Status: DC | PRN
  Administered 2020-02-23 – 2020-02-25 (×7): 25 mg via INTRAVENOUS
  Filled 2020-02-23 (×8): qty 1

## 2020-02-23 MED ORDER — HYDROMORPHONE HCL 1 MG/ML IJ SOLN
1.0000 mg | Freq: Once | INTRAMUSCULAR | Status: AC
Start: 2020-02-23 — End: 2020-02-23
  Administered 2020-02-23: 1 mg via INTRAVENOUS
  Filled 2020-02-23: qty 1

## 2020-02-23 MED ORDER — LACTATED RINGERS IV BOLUS
20.0000 mL/kg | Freq: Once | INTRAVENOUS | Status: AC
Start: 2020-02-23 — End: 2020-02-23
  Administered 2020-02-23: 1098 mL via INTRAVENOUS

## 2020-02-23 MED ORDER — OXYCODONE HCL 5 MG PO TABS: 5.0000 mg | ORAL_TABLET | ORAL | Status: DC | PRN

## 2020-02-23 MED ORDER — ONDANSETRON HCL 4 MG/2ML IJ SOLN
4.0000 mg | Freq: Four times a day (QID) | INTRAMUSCULAR | Status: DC
  Administered 2020-02-23 – 2020-02-24 (×3): 4 mg via INTRAVENOUS
  Filled 2020-02-23 (×6): qty 2

## 2020-02-23 MED ORDER — INSULIN REGULAR(HUMAN) IN NACL 100-0.9 UT/100ML-% IV SOLN
INTRAVENOUS | Status: DC
  Administered 2020-02-23: 6.5 [IU]/h via INTRAVENOUS
  Filled 2020-02-23 (×2): qty 100

## 2020-02-23 MED ORDER — SENNA 8.6 MG PO TABS
1.0000 | ORAL_TABLET | Freq: Every day | ORAL | Status: DC
  Administered 2020-02-24: 8.6 mg via ORAL
  Filled 2020-02-23 (×2): qty 1

## 2020-02-23 MED ORDER — LACTATED RINGERS IV SOLN: INTRAVENOUS | Status: DC

## 2020-02-23 MED ORDER — OXYCODONE HCL 5 MG PO TABS
5.0000 mg | ORAL_TABLET | Freq: Four times a day (QID) | ORAL | Status: DC | PRN
  Administered 2020-02-23: 5 mg via ORAL
  Filled 2020-02-23: qty 1

## 2020-02-23 MED ORDER — LACTATED RINGERS IV BOLUS
20.0000 mL/kg | Freq: Once | INTRAVENOUS | Status: AC
  Administered 2020-02-23: 1098 mL via INTRAVENOUS

## 2020-02-23 MED ORDER — DEXTROSE IN LACTATED RINGERS 5 % IV SOLN: INTRAVENOUS | Status: DC

## 2020-02-23 MED ORDER — DEXTROSE 50 % IV SOLN
0.0000 mL | INTRAVENOUS | Status: DC | PRN
  Administered 2020-02-24: 50 mL via INTRAVENOUS

## 2020-02-23 MED ORDER — MORPHINE SULFATE (PF) 4 MG/ML IV SOLN
4.0000 mg | Freq: Once | INTRAVENOUS | Status: AC
  Administered 2020-02-23: 4 mg via INTRAVENOUS
  Filled 2020-02-23: qty 1

## 2020-02-23 MED ORDER — ONDANSETRON HCL 4 MG/2ML IJ SOLN
4.0000 mg | Freq: Once | INTRAMUSCULAR | Status: AC
  Administered 2020-02-23: 4 mg via INTRAVENOUS
  Filled 2020-02-23: qty 2

## 2020-02-23 MED ORDER — HYDROMORPHONE HCL 1 MG/ML IJ SOLN
1.0000 mg | INTRAMUSCULAR | Status: DC | PRN
  Administered 2020-02-23: 1 mg via INTRAVENOUS
  Filled 2020-02-23: qty 1

## 2020-02-23 MED ORDER — ACETAMINOPHEN 325 MG PO TABS: 650.0000 mg | ORAL_TABLET | Freq: Four times a day (QID) | ORAL | Status: DC | PRN

## 2020-02-23 MED ORDER — ENOXAPARIN SODIUM 40 MG/0.4ML ~~LOC~~ SOLN
40.0000 mg | Freq: Every day | SUBCUTANEOUS | Status: DC
  Filled 2020-02-23 (×3): qty 0.4

## 2020-02-23 MED ORDER — HYDROMORPHONE HCL 1 MG/ML IJ SOLN
0.5000 mg | INTRAMUSCULAR | Status: DC | PRN
  Administered 2020-02-24 – 2020-02-25 (×6): 0.5 mg via INTRAVENOUS
  Filled 2020-02-23 (×6): qty 0.5

## 2020-02-23 MED ORDER — METOCLOPRAMIDE HCL 5 MG/ML IJ SOLN
10.0000 mg | Freq: Four times a day (QID) | INTRAMUSCULAR | Status: DC
  Administered 2020-02-23 – 2020-02-25 (×8): 10 mg via INTRAVENOUS
  Filled 2020-02-23 (×8): qty 2

## 2020-02-23 MED ORDER — METOPROLOL TARTRATE 5 MG/5ML IV SOLN
5.0000 mg | Freq: Two times a day (BID) | INTRAVENOUS | Status: DC | PRN
  Administered 2020-02-23: 5 mg via INTRAVENOUS
  Filled 2020-02-23: qty 5

## 2020-02-23 MED ORDER — POTASSIUM CHLORIDE 10 MEQ/100ML IV SOLN
10.0000 meq | INTRAVENOUS | Status: AC
  Administered 2020-02-23 (×2): 10 meq via INTRAVENOUS
  Filled 2020-02-23 (×2): qty 100

## 2020-02-23 MED ORDER — LABETALOL HCL 5 MG/ML IV SOLN
0.5000 mg/min | Status: DC
  Administered 2020-02-23: 0.5 mg/min via INTRAVENOUS
  Filled 2020-02-23: qty 80

## 2020-02-23 MED ORDER — LABETALOL HCL 200 MG PO TABS
600.0000 mg | ORAL_TABLET | Freq: Three times a day (TID) | ORAL | Status: DC
  Administered 2020-02-23 – 2020-02-25 (×8): 600 mg via ORAL
  Filled 2020-02-23 (×8): qty 3

## 2020-02-23 NOTE — ED Notes (Signed)
Report attempted and unit secretary states the room is not approved by CN.

## 2020-02-23 NOTE — ED Notes (Signed)
Pt's gab closed and provider notified, pt very drowsy at this time and unable to keep control of her insulin pump, order to continue insuline gtt running at 0.8 unit/hour gotten from [facility], admitting MD. Insulin gtt changed to 0.8 as ordered.

## 2020-02-23 NOTE — ED Notes (Signed)
CBG 566 

## 2020-02-23 NOTE — ED Notes (Signed)
Pt started given unit per Endo tool instead of 0.8 units ordered before due to pt's CBG increase to 250,  Admitting provider notified.

## 2020-02-23 NOTE — Progress Notes (Signed)
TRH short progress note  Patient evaluated and admitted by my colleage this AM.   H and P reveiwed.  Subjective: severe nausea, pain in lower and mid back which is severe. She thinks there was a problem with her insulin pump as she rarely develops DKA.   Exam: General exam: Appears very uncomfortable, rocking back and forth HEENT: PERRLA, oral mucosa moist, no sclera icterus or thrush Respiratory system: Clear to auscultation. Respiratory effort normal. Cardiovascular system: S1 & S2 heard, regular rate and rhythm Gastrointestinal system: Abdomen soft, non-tender, nondistended. Normal bowel sounds   Central nervous system: Alert and oriented. No focal neurological deficits. Extremities: No cyanosis, clubbing or edema Skin: No rashes or ulcers Psychiatry:  depressed   Principal Problem:   DKA, type 1 with renal disease - cont insulin infusion - she is asleep (per RN) - will see once she is more alert if we can transition her back to her pump now that the diabetes coordinator has given [place] new supplies- I prefer to do this over starting d/c long acting insulin   Active Problems:    Hypertensive emergency - partly due to pain and stress -  started Labetalol infusion as she is having difficulty with orals  CKD 3 - Cr has risen to 1.92 today- baseline is 1.5 - follow - cont IVF   Vomiting due to Diabetic gastroparesis - start QID Reglan and Zofran - advance diet slowly (NPO right now) - monitor overnight      Marijuana abuse - per RN, the patient's room smells of marijuana    Calvert Cantor, MD [facility] Pager: [email or web address]

## 2020-02-23 NOTE — H&P (Addendum)
History and Physical  Tamara Lane KNL:[id number] DOB: January 05, 1986 DOA: 02/23/2020  Referring physician: Abran Cantor, EDP PCP: Patient, No Pcp Per  Outpatient Specialists: Endocrinology, GI. Patient coming from: Home.  Chief Complaint: Nausea and vomiting.  HPI: Tamara Lane is a 35 y.o. female with medical history significant for type 1 diabetes, gastroparesis who presented to [facility] ED with complaints of intractable nausea and vomiting of 1 day duration.  Associated with hyperglycemia and back pain.  She states her blood sugar has been in the high 500s today and she suspects malfunctioning of her insulin pump.  She is still wearing her pump.  Vomiting exacerbates her back pain.  She denies having any fevers at home.  Associative symptoms include generalized weakness and poor appetite.  She has not been able to take her oral medications for 1 day due to nausea and vomiting.  Work-up in the ED revealed DKA type I.  EDP requested admission for management of DKA and gastroparesis.  ED Course: Afebrile.  BP not at goal.  Lab studies remarkable for elevated creatinine 1.5, baseline 1.2, serum bicarb 16, anion gap 18, VBG pH 7.5 with PCO2 of 26.  UA positive for ketonuria and glucosuria.  Review of Systems: Review of systems as noted in the HPI. All other systems reviewed and are negative.   Past Medical History:  Diagnosis Date  . Diabetes mellitus without complication (HCC)   . Gastroparesis   . Hypertension   . Marijuana abuse 01/13/2020  . Renal insufficiency affecting pregnancy    Past Surgical History:  Procedure Laterality Date  . CESAREAN SECTION    . GASTRIC STIMULATOR IMPLANT SURGERY  2016    Social History:  reports that she has never smoked. She has never used smokeless tobacco. She reports previous alcohol use. She reports current drug use. Drug: Marijuana.   Allergies  Allergen Reactions  . Penicillins Hives    Did it involve swelling of the face/tongue/throat, SOB, or low  BP? N Did it involve sudden or severe rash/hives, skin peeling, or any reaction on the inside of your mouth or nose? Y Did you need to seek medical attention at a hospital or doctor's office? Unknown When did it last happen?childhood If all above answers are "NO", may proceed with cephalosporin use.   . Methocarbamol Nausea And Vomiting and Other (See Comments)    Body shaking Body shaking, nausea   . Azithromycin Hives    Family history: Father with type 1 diabetes Mother with thyroid disease and lung cancer.  Prior to Admission medications   Medication Sig Start Date End Date Taking? Authorizing Provider  acetaminophen (TYLENOL) 325 MG tablet Take 2 tablets (650 mg total) by mouth every 6 (six) hours as needed for mild pain (or Fever >/= 101). 01/16/20   Joseph Art, DO  cefdinir (OMNICEF) 300 MG capsule Take 1 capsule (300 mg total) by mouth every 12 (twelve) hours. 01/16/20   Joseph Art, DO  HYDROcodone-acetaminophen (NORCO/VICODIN) 5-325 MG tablet Take 1 tablet by mouth every 6 (six) hours as needed for moderate pain. 01/16/20   Joseph Art, DO  insulin aspart (NOVOLOG) 100 UNIT/ML FlexPen Inject 1-10 Units into the skin 4 (four) times daily -  with meals and at bedtime. Sliding scale 06/02/18   [provider]  labetalol (NORMODYNE) 300 MG tablet Take 600 mg by mouth 3 (three) times daily.    [provider]  ondansetron (ZOFRAN) 4 MG tablet Take 1 tablet (4 mg total) by  mouth every 6 (six) hours as needed for nausea. 01/16/20   Joseph Art, DO    Physical Exam: BP (!) 197/104   Pulse 92   Temp (!) 97 F (36.1 C) (Temporal)   Resp (!) 24   Ht 5\' 6"  (1.676 m)   Wt 54.9 kg   SpO2 100%   BMI 19.53 kg/m   . General: 35 y.o. year-old female well developed well nourished in no acute distress.  Alert and oriented x3. . Cardiovascular: Regular rate and rhythm with no rubs or gallops.  No thyromegaly or JVD noted.  No lower extremity edema.  2/4 pulses in all 4 extremities. 20 Respiratory: Clear to auscultation with no wheezes or rales. Good inspiratory effort. . Abdomen: Soft nontender nondistended with normal bowel sounds x4 quadrants. . Muskuloskeletal: No cyanosis, clubbing or edema noted bilaterally . Neuro: CN II-XII intact, strength, sensation, reflexes . Skin: No ulcerative lesions noted or rashes . Psychiatry: Judgement and insight appear normal. Mood is appropriate for condition and setting          Labs on Admission:  Basic Metabolic Panel: Recent Labs  Lab 02/23/20 0229 02/23/20 0257  NA 130* 131*  K 4.5 4.4  CL 96*  --   CO2 16*  --   GLUCOSE 581*  --   BUN 20  --   CREATININE 1.50*  --   CALCIUM 9.0  --    Liver Function Tests: Recent Labs  Lab 02/23/20 0229  AST 23  ALT 16  ALKPHOS 97  BILITOT 0.9  PROT 7.0  ALBUMIN 3.9   No results for input(s): LIPASE, AMYLASE in the last 168 hours. No results for input(s): AMMONIA in the last 168 hours. CBC: Recent Labs  Lab 02/23/20 0229 02/23/20 0257  WBC 16.6*  --   NEUTROABS 13.8*  --   HGB 10.5* 11.9*  HCT 31.4* 35.0*  MCV 81.8  --   PLT 215  --    Cardiac Enzymes: No results for input(s): CKTOTAL, CKMB, CKMBINDEX, TROPONINI in the last 168 hours.  BNP (last 3 results) No results for input(s): BNP in the last 8760 hours.  ProBNP (last 3 results) No results for input(s): PROBNP in the last 8760 hours.  CBG: No results for input(s): GLUCAP in the last 168 hours.  Radiological Exams on Admission: No results found.  EKG: I independently viewed the EKG done and my findings are as followed: None at the time of this visit  Assessment/Plan Present on Admission: . DKA, type 1 (HCC)  Active Problems:   DKA, type 1 (HCC)  DKA, type I Presented with hyperglycemia with serum glucose 581, serum bicarb 16 anion gap of 18, UA positive for glycosuria and ketonuria. Suspected malfunctioning of insulin pump. Follows up with endocrinology  outpatient, has an upcoming appointment. Obtain beta hydroxybutyric acid level. Started on insulin drip and IV fluid hydration in the ED, continue. Obtain hemoglobin A1c N.p.o. except for ice chips, sips with meds until transition to long-acting insulin. BMP every 4 hours Will need to follow-up with her endocrinologist posthospitalization  AKI likely prerenal in the setting of dehydration from poor oral intake, nausea, vomiting, and likely polyuria Baseline creatinine appears to be 1.2 with GFR 57 Presented with creatinine of 1.5 with GFR 47 Avoid nephrotoxins, dehydration and hypotension Monitor urine output Repeat BMP in the morning.  Accelerated hypertension BP is not at goal She is on p.o. labetalol at home, resume when able to tolerate oral intake IV Lopressor  as needed 5 mg twice daily with parameters. Monitor vital signs  Intractable nausea and vomiting with history of gastroparesis IV Zofran as needed for nausea and vomiting IV Phenergan as needed for intractable nausea and vomiting Continue IV fluid hydration  Leukocytosis suspect reactive in the setting of DKA Presented with WBC 16.6K No evidence of active infective process Afebrile UA negative for pyuria Monitor and repeat CBC in the morning  Gastroparesis She follows with GI outpatient Treat as indicated  Back Pain possibly from retching Pain control Last CT abdomen/pelvis with contrast 01/13/20, negative musculoskeletal findings.  Chronic normocytic anemia Baseline hemoglobin appears to be 11 Stable, she is at her baseline. No overt bleeding.   DVT prophylaxis: Subcu Lovenox daily  Code Status: Full code  Family Communication: None at bedside.  Disposition Plan: Admit to progressive  Consults called: None.  Admission status: Inpatient status.  The patient will require at least 2 midnights for further evaluation and treatment of present condition.   Status is: Inpatient    Dispo:  Patient  From: Home  Planned Disposition: Home  Expected discharge date: 02/25/2020  Medically stable for discharge: No, ongoing management of DKA type I, gastroparesis and intractable nausea and vomiting.         Darlin Drop MD [facility] Pager [phone]  If 7PM-7AM, please contact night-coverage [email or web address] Password [email or web address]  02/23/2020, 4:11 AM

## 2020-02-23 NOTE — ED Notes (Signed)
Strong smell of weed noticed when entering to pt's room, pt more sleepy than at the beginning of the shift, this RN ask pt if she is taking any medication or drugs not prescribed on the hospital and she responded "no" I will continue to monitor pt by monitor, VS and GCS for any changes.

## 2020-02-23 NOTE — Progress Notes (Addendum)
Inpatient Diabetes Program Recommendations  AACE/ADA: New Consensus Statement on Inpatient Glycemic Control (2015)  Target Ranges:  Prepandial:   less than 140 mg/dL      Peak postprandial:   less than 180 mg/dL (1-2 hours)      Critically ill patients:  140 - 180 mg/dL   Lab Results  Component Value Date   GLUCAP 240 (H) 02/23/2020   HGBA1C 8.2 (H) 01/13/2020    Review of Glycemic Control  Diabetes history: DM type 1 Outpatient Diabetes medications: Omnipod + dexcom CGM Current Omnipod pump settings:  Basal rate: 0.55 Total: 13.2  ISF: 1:30 (sensitivity 1 unit drops glucose 30 points) ICR: 1:10 (carbohydrate ratio 1 unit for every 10 grams of carbs) BG target: 125 Active insulin time: 2 hours  Current orders for Inpatient glycemic control: Endotool IV insulin  DEXCOM CGM located left upper arm on pt on assessment.  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 Pt drowsy when speaking.  -  After next BMET/Beta hydroxybutyric acid may consider transition with insulin pump overlapping pump 1 hour with IV infusion. -  Will need ordered insulin pump order set if insulin pump is used.  Pt seems lethargic so may need to order SQ insulin based on her insulin pump settings listed above. - Lantus 14 units - Novolog "very sensitive scale" 0-6 units tid + hs - Novolog 4 units tid meal coverage if pt eats >50% of meals.  Sees Dr. Lou Miner, Endocrinology outpatient for DM management.  Thanks,  Tamara Deem RN, MSN, BC-ADM Inpatient Diabetes Coordinator Team Pager [phone] (8a-5p)

## 2020-02-23 NOTE — ED Provider Notes (Signed)
[facility] EMERGENCY DEPARTMENT Provider Note   CSN: [id number] Arrival date & time: 02/23/20  0127     History Chief Complaint  Patient presents with  . Hyperglycemia    Tamara Lane is a 35 y.o. female.  Patient presents to the emergency department with chief complaint of hyperglycemia, back pain, and vomiting.  She states that her symptoms started today.  She states that it feels like she is in DKA.  She states that her glucose has been in the high 500s.  She is still wearing her pump.  She reports associated vomiting which is causing her back pain.  She denies any fevers or chills.  Nothing makes the symptoms better.  Denies any other associated symptoms currently.  The history is provided by the patient. No language interpreter was used.       Past Medical History:  Diagnosis Date  . Diabetes mellitus without complication (HCC)   . Gastroparesis   . Hypertension   . Marijuana abuse 01/13/2020  . Renal insufficiency affecting pregnancy     Patient Active Problem List   Diagnosis Date Noted  . Pyelonephritis 01/14/2020  . Cystitis 01/13/2020  . Type 1 diabetes mellitus with hyperglycemia, with long-term current use of insulin (HCC) 01/13/2020  . Diabetic gastroparesis (HCC) 01/13/2020  . Essential hypertension 01/13/2020  . Marijuana abuse 01/13/2020    Past Surgical History:  Procedure Laterality Date  . CESAREAN SECTION    . GASTRIC STIMULATOR IMPLANT SURGERY  2016     OB History    Gravida  2   Para  1   Term      Preterm  1   AB      Living  1     SAB      IAB      Ectopic      Multiple      Live Births  1           No family history on file.  Social History   Tobacco Use  . Smoking status: Never Smoker  . Smokeless tobacco: Never Used  Substance Use Topics  . Alcohol use: Not Currently  . Drug use: Yes    Types: Marijuana    Comment: intermittent use    Home Medications Prior to Admission  medications   Medication Sig Start Date End Date Taking? Authorizing Provider  acetaminophen (TYLENOL) 325 MG tablet Take 2 tablets (650 mg total) by mouth every 6 (six) hours as needed for mild pain (or Fever >/= 101). 01/16/20   Joseph Art, DO  cefdinir (OMNICEF) 300 MG capsule Take 1 capsule (300 mg total) by mouth every 12 (twelve) hours. 01/16/20   Joseph Art, DO  HYDROcodone-acetaminophen (NORCO/VICODIN) 5-325 MG tablet Take 1 tablet by mouth every 6 (six) hours as needed for moderate pain. 01/16/20   Joseph Art, DO  insulin aspart (NOVOLOG) 100 UNIT/ML FlexPen Inject 1-10 Units into the skin 4 (four) times daily -  with meals and at bedtime. Sliding scale 06/02/18   [provider]  labetalol (NORMODYNE) 300 MG tablet Take 600 mg by mouth 3 (three) times daily.    [provider]  ondansetron (ZOFRAN) 4 MG tablet Take 1 tablet (4 mg total) by mouth every 6 (six) hours as needed for nausea. 01/16/20   Joseph Art, DO    Allergies    Penicillins, Methocarbamol, and Azithromycin  Review of Systems   Review of Systems  All other  systems reviewed and are negative.   Physical Exam Updated Vital Signs BP (!) 160/129 (BP Location: Right Arm)   Pulse 98   Temp (!) 97 F (36.1 C) (Temporal)   Resp 16   Ht 5\' 6"  (1.676 m)   Wt 54.9 kg   SpO2 100%   BMI 19.53 kg/m   Physical Exam Vitals and nursing note reviewed.  Constitutional:      General: She is not in acute distress.    Appearance: She is well-developed and well-nourished.     Comments: Retching  HENT:     Head: Normocephalic and atraumatic.  Eyes:     Conjunctiva/sclera: Conjunctivae normal.  Cardiovascular:     Rate and Rhythm: Regular rhythm. Tachycardia present.     Heart sounds: No murmur heard.   Pulmonary:     Effort: Pulmonary effort is normal. No respiratory distress.     Breath sounds: Normal breath sounds.  Abdominal:     Palpations: Abdomen is soft.     Tenderness:  There is no abdominal tenderness.  Musculoskeletal:        General: No edema. Normal range of motion.     Cervical back: Neck supple.  Skin:    General: Skin is warm and dry.  Neurological:     Mental Status: She is alert and oriented to person, place, and time.  Psychiatric:        Mood and Affect: Mood and affect normal.        Behavior: Behavior normal.     ED Results / Procedures / Treatments   Labs (all labs ordered are listed, but only abnormal results are displayed) Labs Reviewed  SARS CORONAVIRUS 2 (TAT 6-24 HRS)  CBC WITH DIFFERENTIAL/PLATELET  COMPREHENSIVE METABOLIC PANEL  URINALYSIS, ROUTINE W REFLEX MICROSCOPIC  CBG MONITORING, ED  I-STAT BETA HCG BLOOD, ED ([facility], [facility], [facility] ONLY)  I-STAT VENOUS BLOOD GAS, ED    EKG None  Radiology No results found.  Procedures .Critical Care Performed by: , PA-C Authorized by: Roxy Horseman, PA-C   Critical care provider statement:    Critical care time (minutes):  45   Critical care was necessary to treat or prevent imminent or life-threatening deterioration of the following conditions:  Metabolic crisis   Critical care was time spent personally by me on the following activities:  Discussions with consultants, evaluation of patient's response to treatment, examination of patient, ordering and performing treatments and interventions, ordering and review of laboratory studies, ordering and review of radiographic studies, pulse oximetry, re-evaluation of patient's condition, obtaining history from patient or surrogate and review of old charts     Medications Ordered in ED Medications  HYDROmorphone (DILAUDID) injection 1 mg (has no administration in time range)  ondansetron (ZOFRAN) injection 4 mg (has no administration in time range)    ED Course  I have reviewed the triage vital signs and the nursing notes.  Pertinent labs & imaging results that were available during my care of the patient were reviewed  by me and considered in my medical decision making (see chart for details).    MDM Rules/Calculators/A&P                          This patient complains of vomiting and hyperglycemia, this involves an extensive number of treatment options, and is a complaint that carries with it a high risk of complications and morbidity.    Differential Dx DKA, gastroparesis, hyperglycemia  Pertinent  Labs I ordered, reviewed, and interpreted labs, which included CBG is 588, anion gap 18, K 4.4, moderate leukocytosis.  Medications I ordered medication fluids, insulin, dilaudid, zofran for hyperglycemia, pain, and nausea.  Sources Previous records obtained and reviewed no recent admissions for DKA, was admitted in December for pyelo.   Critical Interventions  Insulin drip and fluids  Reassessments After the interventions stated above, I reevaluated the patient and found still nauseous and uncomfortable.  Consultants Dr. Margo Aye - [facility], who is appreciated for admitting.  Plan Admit    Final Clinical Impression(s) / ED Diagnoses Final diagnoses:  Diabetic ketoacidosis without coma associated with type 1 diabetes mellitus [facility])    Rx / DC Orders ED Discharge Orders    None       Roxy Horseman, PA-C 02/23/20 0410    Dione Booze, MD 02/23/20 [id number]    Dione Booze, MD 02/23/20 [phone]

## 2020-02-23 NOTE — ED Triage Notes (Signed)
Pt arrives from home via ems due to back pain. Pt states she has been having chronic back pain pt the pain has gotten worse. Upon arrival ems reports pt states her BS has been high all day. CBG with ems read high. Pt reports vomiting started tonight.

## 2020-02-24 LAB — GLUCOSE, CAPILLARY
Glucose-Capillary: 142 mg/dL — ABNORMAL HIGH (ref 70–99)
Glucose-Capillary: 156 mg/dL — ABNORMAL HIGH (ref 70–99)
Glucose-Capillary: 159 mg/dL — ABNORMAL HIGH (ref 70–99)
Glucose-Capillary: 161 mg/dL — ABNORMAL HIGH (ref 70–99)
Glucose-Capillary: 165 mg/dL — ABNORMAL HIGH (ref 70–99)
Glucose-Capillary: 166 mg/dL — ABNORMAL HIGH (ref 70–99)
Glucose-Capillary: 170 mg/dL — ABNORMAL HIGH (ref 70–99)
Glucose-Capillary: 191 mg/dL — ABNORMAL HIGH (ref 70–99)
Glucose-Capillary: 193 mg/dL — ABNORMAL HIGH (ref 70–99)
Glucose-Capillary: 217 mg/dL — ABNORMAL HIGH (ref 70–99)
Glucose-Capillary: 217 mg/dL — ABNORMAL HIGH (ref 70–99)
Glucose-Capillary: 220 mg/dL — ABNORMAL HIGH (ref 70–99)
Glucose-Capillary: 231 mg/dL — ABNORMAL HIGH (ref 70–99)
Glucose-Capillary: 25 mg/dL — CL (ref 70–99)
Glucose-Capillary: 341 mg/dL — ABNORMAL HIGH (ref 70–99)
Glucose-Capillary: 370 mg/dL — ABNORMAL HIGH (ref 70–99)
Glucose-Capillary: 416 mg/dL — ABNORMAL HIGH (ref 70–99)
Glucose-Capillary: 88 mg/dL (ref 70–99)

## 2020-02-24 MED ORDER — INSULIN ASPART 100 UNIT/ML ~~LOC~~ SOLN
3.0000 [IU] | Freq: Three times a day (TID) | SUBCUTANEOUS | Status: DC
  Administered 2020-02-24 – 2020-02-25 (×2): 3 [IU] via SUBCUTANEOUS

## 2020-02-24 MED ORDER — INSULIN ASPART 100 UNIT/ML ~~LOC~~ SOLN
2.0000 [IU] | SUBCUTANEOUS | Status: AC
  Administered 2020-02-24: 2 [IU] via SUBCUTANEOUS

## 2020-02-24 MED ORDER — INSULIN ASPART 100 UNIT/ML ~~LOC~~ SOLN
0.0000 [IU] | Freq: Every day | SUBCUTANEOUS | Status: DC
  Administered 2020-02-24: 4 [IU] via SUBCUTANEOUS

## 2020-02-24 MED ORDER — INSULIN ASPART 100 UNIT/ML ~~LOC~~ SOLN
6.0000 [IU] | Freq: Three times a day (TID) | SUBCUTANEOUS | Status: DC
  Administered 2020-02-24: 6 [IU] via SUBCUTANEOUS

## 2020-02-24 MED ORDER — DEXTROSE 50 % IV SOLN
INTRAVENOUS | Status: AC
  Filled 2020-02-24: qty 50

## 2020-02-24 MED ORDER — INSULIN ASPART 100 UNIT/ML ~~LOC~~ SOLN
0.0000 [IU] | Freq: Three times a day (TID) | SUBCUTANEOUS | Status: DC
  Administered 2020-02-25: 4 [IU] via SUBCUTANEOUS
  Administered 2020-02-25: 2 [IU] via SUBCUTANEOUS
  Administered 2020-02-25: 3 [IU] via SUBCUTANEOUS

## 2020-02-24 MED ORDER — INSULIN GLARGINE 100 UNIT/ML ~~LOC~~ SOLN
20.0000 [IU] | Freq: Every day | SUBCUTANEOUS | Status: DC
  Administered 2020-02-24 – 2020-02-25 (×2): 20 [IU] via SUBCUTANEOUS
  Filled 2020-02-24 (×2): qty 0.2

## 2020-02-24 MED ORDER — INSULIN ASPART 100 UNIT/ML ~~LOC~~ SOLN
0.0000 [IU] | Freq: Three times a day (TID) | SUBCUTANEOUS | Status: DC
  Administered 2020-02-24: 5 [IU] via SUBCUTANEOUS

## 2020-02-24 NOTE — Progress Notes (Signed)
Patients recent CBG levels have been in recommended 140-180 Endotool range.    Ref. Range 02/23/2020 21:04 02/23/2020 21:15 02/23/2020 22:33 02/23/2020 23:30 02/24/2020 00:30 02/24/2020 01:26 02/24/2020 02:40 02/24/2020 03:42  Glucose-Capillary Latest Ref Range: 70 - 99 mg/dL  115 (H) 520 (H) 802 (H) 191 (H) 156 (H) 170 (H) 166 (H)   Awaiting further instruction from MD about transitional phase. Per Endotool, the next CBG will need to be checked after 2 hours, at 0545.

## 2020-02-24 NOTE — Progress Notes (Signed)
Patient ID: Tamara Lane, female   DOB: 08-12-1985, 35 y.o.   MRN: [id number]  PROGRESS NOTE    Etola Mull  PFX:[id number] DOB: 1985-09-14 DOA: 02/23/2020 PCP: Patient, No Pcp Per    Brief Narrative:  Carman Auxier is a 35 y.o. female with medical history significant for type 1 diabetes, gastroparesis who presented to [facility] ED with complaints of intractable nausea and vomiting of 1 day duration.  Associated with hyperglycemia and back pain.  She states her blood sugar has been in the high 500s today and she suspects malfunctioning of her insulin pump.  She is still wearing her pump.  Vomiting exacerbates her back pain.  She denies having any fevers at home.  Associative symptoms include generalized weakness and poor appetite.  She has not been able to take her oral medications for 1 day due to nausea and vomiting.  Work-up in the ED revealed DKA type I.  EDP requested admission for management of DKA and gastroparesis.    Assessment & Plan:   Principal Problem:   DKA, type 1 (HCC) Active Problems:   Type 1 diabetes mellitus with hyperglycemia, with long-term current use of insulin (HCC)   Diabetic gastroparesis (HCC)   Marijuana abuse   Hypertensive emergency  DKA, type I  Suspected malfunctioning of insulin pump.  Patient reports she felt too unwell to fix her insulin pump.  It has been greater than 7 years since her last admission for DKA. Follows up with endocrinology outpatient, has an upcoming appointment.  Started on insulin drip and IV fluid hydration in the ED, continued for several hours until was discontinued around noon on 02/24/2020. Given long-acting insulin, with meal correction.  Then had episode of hypoglycemia possibly related to worsening kidney function I decreased her meal coverage and change sliding scale to very sensitive   AKI  Baseline creatinine is 1.2, in the setting of chronic kidney disease stage IIIa Avoid nephrotoxic agents  Accelerated hypertension BP  is not at goal She is on p.o. labetalol at home, resume when able to tolerate oral intake IV Lopressor as needed 5 mg twice daily with parameters. Monitor vital signs This is improved  Intractable nausea and vomiting with history of gastroparesis IV Zofran as needed for nausea and vomiting IV Phenergan as needed for intractable nausea and vomiting Continue IV fluid hydration Improved CBC this morning octinoxate  Leukocytosis suspect reactive in the setting of DKA Presented with WBC 16.6K No evidence of active infective process Afebrile UA negative for pyuria  Gastroparesis She follows with GI outpatient Treat as indicated  Back Pain possibly from retching Pain control Last CT abdomen/pelvis with contrast 01/13/20, negative musculoskeletal findings.  Chronic normocytic anemia Baseline hemoglobin appears to be 11 Stable, she is at her baseline. No overt bleeding.   DVT prophylaxis: Lovenox SQ Code Status: Full code  Family Communication: Patient at bedside Disposition Plan: Home   Consultants:   None  Procedures:  None   Antimicrobials: Anti-infectives (From admission, onward)   None       Subjective: Feels some better  Objective: Vitals:   02/24/20 0909 02/24/20 1016 02/24/20 1326 02/24/20 1658  BP: (!) 143/64 (!) 173/84 (!) 151/80 (!) 152/92  Pulse: 94 93 97 92  Resp: 16  19 19   Temp: 98.8 F (37.1 C)  98.6 F (37 C) 98.1 F (36.7 C)  TempSrc: Oral  Oral Oral  SpO2: 100%  100% 100%  Weight:      Height:  Intake/Output Summary (Last 24 hours) at 02/24/2020 1821 Last data filed at 02/24/2020 1754 Gross per 24 hour  Intake 1342.21 ml  Output --  Net 1342.21 ml   Filed Weights   02/23/20 0135 02/23/20 2133  Weight: 54.9 kg 55.3 kg    Examination:  General exam: Appears calm and comfortable  Respiratory system: Clear to auscultation. Respiratory effort normal. Cardiovascular system: S1 & S2 heard, RRR.  Gastrointestinal  system: Abdomen is nondistended, soft and nontender.  Central nervous system: Alert and oriented. No focal neurological deficits. Extremities: Symmetric  Skin: No rashes Psychiatry: Judgement and insight appear normal. Mood & affect appropriate.     Data Reviewed: I have personally reviewed following labs and imaging studies  CBC: Recent Labs  Lab 02/23/20 0229 02/23/20 0257 02/23/20 0515  WBC 16.6*  --  22.3*  NEUTROABS 13.8*  --  20.7*  HGB 10.5* 11.9* 11.0*  HCT 31.4* 35.0* 35.0*  MCV 81.8  --  83.3  PLT 215  --  267   Basic Metabolic Panel: Recent Labs  Lab 02/23/20 0229 02/23/20 0257 02/23/20 0515 02/23/20 0923 02/23/20 1344 02/23/20 2104  NA 130* 131* 133* 133* 135 133*  133*  K 4.5 4.4 4.1 4.2 4.0 3.9  3.9  CL 96*  --  98 100 102 103  102  CO2 16*  --  20* 23 23 21*  19*  GLUCOSE 581*  --  405* 251* 212* 284*  279*  BUN 20  --  23* 19 24* 27*  27*  CREATININE 1.50*  --  1.52* 1.31* 1.92* 2.16*  2.11*  CALCIUM 9.0  --  9.4 9.1 8.9 8.8*  8.8*   GFR: Estimated Creatinine Clearance: 32.8 mL/min (A) (by C-G formula based on SCr of 2.11 mg/dL (H)). Liver Function Tests: Recent Labs  Lab 02/23/20 0229  AST 23  ALT 16  ALKPHOS 97  BILITOT 0.9  PROT 7.0  ALBUMIN 3.9   CBG: Recent Labs  Lab 02/24/20 1125 02/24/20 1228 02/24/20 1555 02/24/20 1607 02/24/20 1635  GLUCAP 161* 88 25* 217* 142*     Recent Results (from the past 240 hour(s))  SARS CORONAVIRUS 2 (TAT 6-24 HRS) Nasopharyngeal Nasopharyngeal Swab     Status: None   Collection Time: 02/23/20  2:46 AM   Specimen: Nasopharyngeal Swab  Result Value Ref Range Status   SARS Coronavirus 2 NEGATIVE NEGATIVE Final    Comment: (NOTE) SARS-CoV-2 target nucleic acids are NOT DETECTED.  The SARS-CoV-2 RNA is generally detectable in upper and lower respiratory specimens during the acute phase of infection. Negative results do not preclude SARS-CoV-2 infection, do not rule out co-infections  with other pathogens, and should not be used as the sole basis for treatment or other patient management decisions. Negative results must be combined with clinical observations, patient history, and epidemiological information. The expected result is Negative.  Fact Sheet for Patients: HairSlick.no  Fact Sheet for Healthcare Providers: quierodirigir.com  This test is not yet approved or cleared by the [place] FDA and  has been authorized for detection and/or diagnosis of SARS-CoV-2 by FDA under an Emergency Use Authorization (EUA). This EUA will remain  in effect (meaning this test can be used) for the duration of the COVID-19 declaration under Se ction 564(b)(1) of the Act, 21 U.S.C. section 360bbb-3(b)(1), unless the authorization is terminated or revoked sooner.  Performed at [facility], [address], [place], [place] [address]       Radiology Studies: No  results found.   Scheduled Meds: . enoxaparin (LOVENOX) injection  40 mg Subcutaneous Daily  . insulin aspart  0-5 Units Subcutaneous QHS  . [START ON 02/25/2020] insulin aspart  0-6 Units Subcutaneous TID WC  . [START ON 02/25/2020] insulin aspart  3 Units Subcutaneous TID WC  . insulin glargine  20 Units Subcutaneous Daily  . labetalol  600 mg Oral TID  . metoCLOPramide (REGLAN) injection  10 mg Intravenous Q6H  . ondansetron (ZOFRAN) IV  4 mg Intravenous Q6H  . senna  1 tablet Oral QHS   Continuous Infusions: . labetalol (NORMODYNE) infusion 5 mg/mL Stopped (02/23/20 1524)  . lactated ringers Stopped (02/24/20 1330)     LOS: 1 day    Reva Bores, MD 02/24/2020 6:21 PM [phone] [facility] If 7PM-7AM, please contact night-coverage 02/24/2020, 6:21 PM

## 2020-02-24 NOTE — Progress Notes (Signed)
Inpatient Diabetes Program Recommendations  AACE/ADA: New Consensus Statement on Inpatient Glycemic Control (2015)  Target Ranges:  Prepandial:   less than 140 mg/dL      Peak postprandial:   less than 180 mg/dL (1-2 hours)      Critically ill patients:  140 - 180 mg/dL   Lab Results  Component Value Date   GLUCAP 142 (H) 02/24/2020   HGBA1C 8.2 (H) 01/13/2020    Review of Glycemic Control Results for Tamara Lane, Tamara Lane (MRN [id number]) as of 02/24/2020 16:41  Ref. Range 02/24/2020 12:28 02/24/2020 15:55 02/24/2020 16:07 02/24/2020 16:35  Glucose-Capillary Latest Ref Range: 70 - 99 mg/dL 88 25 (LL) 948 (H) 546 (H)   Inpatient Diabetes Program Recommendations:    Renal function worsening. Consider the following:  -   Decreasing meal coverage to 3 units tid.  -  Decreasing Novolog Correction to "very sensitive" scale 0-6 units Q4 hours.  Thanks,  Christena Deem RN, MSN, BC-ADM Inpatient Diabetes Coordinator Team Pager [phone] (8a-5p) n

## 2020-02-24 NOTE — Progress Notes (Signed)
HOSPITAL MEDICINE OVERNIGHT EVENT NOTE    Notified by nursing she has received ENDOTOOL alert that patient is to be transitioned off of insulin infusion.  Chart reviewed.  Gap has been closed since yesterday afternoon and beta-hydroxybutyrate level normalized.  We will place patient on basal bolus insulin therapy for now.  20 units of Lantus daily with 6 units of NovoLog before every meal.  Additional Accu-Cheks before every meal and nightly with sliding scale insulin will additionally be ordered.  Diabetic diet additionally started.  Insulin infusion to be stopped 2 hrs after administration of basal insulin administration.      Marinda Elk  MD [facility]

## 2020-02-24 NOTE — Progress Notes (Signed)
Inpatient Diabetes Program Recommendations  AACE/ADA: New Consensus Statement on Inpatient Glycemic Control (2015)  Target Ranges:  Prepandial:   less than 140 mg/dL      Peak postprandial:   less than 180 mg/dL (1-2 hours)      Critically ill patients:  140 - 180 mg/dL   Lab Results  Component Value Date   GLUCAP 161 (H) 02/24/2020   HGBA1C 8.2 (H) 01/13/2020    Review of Glycemic Control Results for Tamara Lane, Tamara Lane (MRN [id number]) as of 02/24/2020 12:26  Ref. Range 02/24/2020 07:57 02/24/2020 09:14 02/24/2020 10:19 02/24/2020 11:25  Glucose-Capillary Latest Ref Range: 70 - 99 mg/dL 818 (H) 299 (H) 371 (H) 161 (H)    Diabetes history: DM type 1 Outpatient Diabetes medications: Omnipod + dexcom CGM Current Omnipod pump settings:  Basal rate: 0.55 Total: 13.2  ISF: 1:30 (sensitivity 1 unit drops glucose 30 points) ICR: 1:10 (carbohydrate ratio 1 unit for every 10 grams of carbs) BG target: 125 Active insulin time: 2 hours  Current orders for Inpatient glycemic control: Endotool IV insulin to transition to Lantus 20 units QD, Novolog 0-15 units TID, Novolog 6 units TID  Spoke with patient regarding insulin pump. Does not have needed supplies with her in the hospital, does have multiple sites at home.  She reports being at work when she began noticing that her pump was not delivering insulin and blood sugars were increasing. She feels that it was related to area of placement which was close to a scar. Patient reports that she usually tries to avoid and plans to have extra insertion pods with her next time. When at home, patient would just change her site.  Reviewed with patient the next appropriate time for application of insulin pump based on previous administration of Lantus. Patient is in agreement and expressed understand. Has no further questions. Plans to see outpatient endocrinology in February.  Thanks, Lujean Rave, MSN, RNC-OB Diabetes Coordinator [phone] (8a-5p)

## 2020-02-24 NOTE — Progress Notes (Signed)
Pt was extremely lethargic and sweaty during the blood sugar check. Rapid response was called. Hypoglycemic protocol was activated. MD and diabetic coordinator made aware as well. Pt is now responding and answering questions appropriately. RN will continue to monitor

## 2020-02-25 DIAGNOSIS — K3184 Gastroparesis: Secondary | ICD-10-CM

## 2020-02-25 DIAGNOSIS — F1210 Cannabis abuse, uncomplicated: Secondary | ICD-10-CM | POA: Diagnosis not present

## 2020-02-25 DIAGNOSIS — E1065 Type 1 diabetes mellitus with hyperglycemia: Secondary | ICD-10-CM

## 2020-02-25 DIAGNOSIS — N179 Acute kidney failure, unspecified: Secondary | ICD-10-CM

## 2020-02-25 DIAGNOSIS — E1143 Type 2 diabetes mellitus with diabetic autonomic (poly)neuropathy: Secondary | ICD-10-CM

## 2020-02-25 DIAGNOSIS — I161 Hypertensive emergency: Secondary | ICD-10-CM

## 2020-02-25 DIAGNOSIS — E1010 Type 1 diabetes mellitus with ketoacidosis without coma: Secondary | ICD-10-CM | POA: Diagnosis not present

## 2020-02-25 LAB — BASIC METABOLIC PANEL
Anion gap: 8 (ref 5–15)
BUN: 17 mg/dL (ref 6–20)
CO2: 23 mmol/L (ref 22–32)
Calcium: 8.6 mg/dL — ABNORMAL LOW (ref 8.9–10.3)
Chloride: 104 mmol/L (ref 98–111)
Creatinine, Ser: 1.61 mg/dL — ABNORMAL HIGH (ref 0.44–1.00)
GFR, Estimated: 43 mL/min — ABNORMAL LOW (ref >60)
Glucose, Bld: 183 mg/dL — ABNORMAL HIGH (ref 70–99)
Potassium: 4.2 mmol/L (ref 3.5–5.1)
Sodium: 135 mmol/L (ref 135–145)

## 2020-02-25 LAB — GLUCOSE, CAPILLARY
Glucose-Capillary: 306 mg/dL — ABNORMAL HIGH (ref 70–99)
Glucose-Capillary: 294 mg/dL — ABNORMAL HIGH (ref 70–99)
Glucose-Capillary: 213 mg/dL — ABNORMAL HIGH (ref 70–99)

## 2020-02-25 NOTE — Discharge Summary (Signed)
Physician Discharge Summary  Tamara Lane MAU:[id number] DOB: 01/23/1986   PCP: Patient, No Pcp Per  Admit date: 02/23/2020 Discharge date: 02/25/2020 Length of Stay: 2 days   Code Status: Full Code  Admitted From:  Home Discharged to:   Home Home Health:  None  Equipment/Devices:  None Discharge Condition:  Stable  Recommendations for Outpatient Follow-up   1. Follow up with Endocrinology in 1 week 2. Follow up renal function, BMP ordered  3. Follow-up leukocytosis, CBC ordered 4. Hypertension management  Hospital Summary   This is a 35 year old female with a history of Type 1 diabetes with insulin pump, gastroparesis who presented to the ED on 1/27 with intractable nausea/vomiting x 1 day with hyperglycemia (500s at home). She initially thought that she had a malfunctioning insulin pump but later determined that she misplaced the pump over some scar tissue.  Found to be in DKA upon presentation and started on insulin drip and IV fluids in the ED and was discontinued around noon on 1/28 and was given long-acting insulin with meal correction.  She had an episode of hypoglycemia thought to be related to insulin in the setting of worsening renal function and so insulin was decreased.  Her glucose and renal function improved on 1/29.  Patient was discharged in stable condition on her usual home regimen per insulin pump and advised to get follow-up labs this week with endocrinology follow-up.  A & P   Principal Problem:   DKA, type 1 (HCC) Active Problems:   Type 1 diabetes mellitus with hyperglycemia, with long-term current use of insulin (HCC)   Diabetic gastroparesis (HCC)   Marijuana abuse   Hypertensive emergency   DKA in type I diabetic, resolved She believes this is due to the insulin pump being over scar tissue and subsequently malfunctioning Has supplies at home Continue home regimen HbA1c was not ordered upon presentation Follow-up BMP and endocrine follow-up this  week  AKI on CKD 3 a Baseline creatinine 1.2-1.3, peaked at 2.1 and currently 1.6 Follow-up BMP outpatient  Accelerated hypertension Continue home labetalol Outpatient follow-up  Intractable nausea and vomiting with history of gastroparesis, resolved Follow-up with GI outpatient for gastroparesis  Leukocytosis, likely reactive in the setting of DKA WBC peaked at 22 and was not rechecked since 1/27 She is afebrile and hemodynamically stable on room air without any signs of infection and is currently stable for discharge CBC this week with outpatient follow-up  Musculoskeletal back pain Advised to use heating pad and Tylenol if needed  Chronic normocytic anemia, stable   Consultants  . None  Procedures  . None  Antibiotics   Anti-infectives (From admission, onward)   None       Subjective  Patient seen and examined at bedside no acute distress and resting comfortably.  No events overnight.  Tolerating diet. In good spirits and anticipating discharge.   Denies any chest pain, shortness of breath, fever, nausea, vomiting, urinary or bowel complaints. Otherwise ROS negative    Objective   Discharge Exam: Vitals:   02/25/20 0833 02/25/20 1125  BP: (!) 174/93 (!) 169/76  Pulse: 95 98  Resp:  16  Temp:  98.6 F (37 C)  SpO2:     Vitals:   02/25/20 0315 02/25/20 0758 02/25/20 0833 02/25/20 1125  BP: (!) 164/89 (!) 174/93 (!) 174/93 (!) 169/76  Pulse:   95 98  Resp: 15 16  16   Temp: 99.2 F (37.3 C) 99.6 F (37.6 C)  98.6 F (  37 C)  TempSrc: Oral Oral  Oral  SpO2: 100% 100%    Weight:      Height:        Physical Exam Vitals and nursing note reviewed.  Constitutional:      Appearance: Normal appearance.  HENT:     Head: Normocephalic and atraumatic.  Eyes:     Conjunctiva/sclera: Conjunctivae normal.  Cardiovascular:     Rate and Rhythm: Normal rate and regular rhythm.  Pulmonary:     Effort: Pulmonary effort is normal.     Breath sounds:  Normal breath sounds.  Abdominal:     General: Abdomen is flat.     Palpations: Abdomen is soft.  Musculoskeletal:        General: No swelling or tenderness.  Skin:    Coloration: Skin is not jaundiced or pale.  Neurological:     Mental Status: She is alert. Mental status is at baseline.  Psychiatric:        Mood and Affect: Mood normal.        Behavior: Behavior normal.       The results of significant diagnostics from this hospitalization (including imaging, microbiology, ancillary and laboratory) are listed below for reference.     Microbiology: Recent Results (from the past 240 hour(s))  SARS CORONAVIRUS 2 (TAT 6-24 HRS) Nasopharyngeal Nasopharyngeal Swab     Status: None   Collection Time: 02/23/20  2:46 AM   Specimen: Nasopharyngeal Swab  Result Value Ref Range Status   SARS Coronavirus 2 NEGATIVE NEGATIVE Final    Comment: (NOTE) SARS-CoV-2 target nucleic acids are NOT DETECTED.  The SARS-CoV-2 RNA is generally detectable in upper and lower respiratory specimens during the acute phase of infection. Negative results do not preclude SARS-CoV-2 infection, do not rule out co-infections with other pathogens, and should not be used as the sole basis for treatment or other patient management decisions. Negative results must be combined with clinical observations, patient history, and epidemiological information. The expected result is Negative.  Fact Sheet for Patients: HairSlick.no  Fact Sheet for Healthcare Providers: quierodirigir.com  This test is not yet approved or cleared by the [place] FDA and  has been authorized for detection and/or diagnosis of SARS-CoV-2 by FDA under an Emergency Use Authorization (EUA). This EUA will remain  in effect (meaning this test can be used) for the duration of the COVID-19 declaration under Se ction 564(b)(1) of the Act, 21 U.S.C. section 360bbb-3(b)(1), unless the  authorization is terminated or revoked sooner.  Performed at [facility], [address], [place], [place] [address]      Labs: BNP (last 3 results) No results for input(s): BNP in the last 8760 hours. Basic Metabolic Panel: Recent Labs  Lab 02/23/20 0515 02/23/20 0923 02/23/20 1344 02/23/20 2104 02/25/20 0110  NA 133* 133* 135 133*  133* 135  K 4.1 4.2 4.0 3.9  3.9 4.2  CL 98 100 102 103  102 104  CO2 20* 23 23 21*  19* 23  GLUCOSE 405* 251* 212* 284*  279* 183*  BUN 23* 19 24* 27*  27* 17  CREATININE 1.52* 1.31* 1.92* 2.16*  2.11* 1.61*  CALCIUM 9.4 9.1 8.9 8.8*  8.8* 8.6*   Liver Function Tests: Recent Labs  Lab 02/23/20 0229  AST 23  ALT 16  ALKPHOS 97  BILITOT 0.9  PROT 7.0  ALBUMIN 3.9   No results for input(s): LIPASE, AMYLASE in the last 168 hours. No results for input(s): AMMONIA  in the last 168 hours. CBC: Recent Labs  Lab 02/23/20 0229 02/23/20 0257 02/23/20 0515  WBC 16.6*  --  22.3*  NEUTROABS 13.8*  --  20.7*  HGB 10.5* 11.9* 11.0*  HCT 31.4* 35.0* 35.0*  MCV 81.8  --  83.3  PLT 215  --  267   Cardiac Enzymes: No results for input(s): CKTOTAL, CKMB, CKMBINDEX, TROPONINI in the last 168 hours. BNP: Invalid input(s): POCBNP CBG: Recent Labs  Lab 02/24/20 1855 02/24/20 2136 02/24/20 2347 02/25/20 0622 02/25/20 1129  GLUCAP 416* 341* 217* 306* 294*   D-Dimer No results for input(s): DDIMER in the last 72 hours. Hgb A1c No results for input(s): HGBA1C in the last 72 hours. Lipid Profile No results for input(s): CHOL, HDL, LDLCALC, TRIG, CHOLHDL, LDLDIRECT in the last 72 hours. Thyroid function studies No results for input(s): TSH, T4TOTAL, T3FREE, THYROIDAB in the last 72 hours.  Invalid input(s): FREET3 Anemia work up No results for input(s): VITAMINB12, FOLATE, FERRITIN, TIBC, IRON, RETICCTPCT in the last 72 hours. Urinalysis    Component Value Date/Time   COLORURINE STRAW (A) 02/23/2020 0230   APPEARANCEUR  HAZY (A) 02/23/2020 0230   LABSPEC 1.013 02/23/2020 0230   PHURINE 7.0 02/23/2020 0230   GLUCOSEU >=500 (A) 02/23/2020 0230   HGBUR NEGATIVE 02/23/2020 0230   BILIRUBINUR NEGATIVE 02/23/2020 0230   KETONESUR 20 (A) 02/23/2020 0230   PROTEINUR 30 (A) 02/23/2020 0230   NITRITE NEGATIVE 02/23/2020 0230   LEUKOCYTESUR NEGATIVE 02/23/2020 0230   Sepsis Labs Invalid input(s): PROCALCITONIN,  WBC,  LACTICIDVEN Microbiology Recent Results (from the past 240 hour(s))  SARS CORONAVIRUS 2 (TAT 6-24 HRS) Nasopharyngeal Nasopharyngeal Swab     Status: None   Collection Time: 02/23/20  2:46 AM   Specimen: Nasopharyngeal Swab  Result Value Ref Range Status   SARS Coronavirus 2 NEGATIVE NEGATIVE Final    Comment: (NOTE) SARS-CoV-2 target nucleic acids are NOT DETECTED.  The SARS-CoV-2 RNA is generally detectable in upper and lower respiratory specimens during the acute phase of infection. Negative results do not preclude SARS-CoV-2 infection, do not rule out co-infections with other pathogens, and should not be used as the sole basis for treatment or other patient management decisions. Negative results must be combined with clinical observations, patient history, and epidemiological information. The expected result is Negative.  Fact Sheet for Patients: HairSlick.no  Fact Sheet for Healthcare Providers: quierodirigir.com  This test is not yet approved or cleared by the [place] FDA and  has been authorized for detection and/or diagnosis of SARS-CoV-2 by FDA under an Emergency Use Authorization (EUA). This EUA will remain  in effect (meaning this test can be used) for the duration of the COVID-19 declaration under Se ction 564(b)(1) of the Act, 21 U.S.C. section 360bbb-3(b)(1), unless the authorization is terminated or revoked sooner.  Performed at [facility], [address], [place], [place] [address]     Discharge  Instructions     Discharge Instructions    Diet - low sodium heart healthy   Complete by: As directed    Discharge instructions   Complete by: As directed    You were seen in the hospital for DKA.   Upon Discharge: - Continue your home Insulin - Get lab work early this week and follow up with your endocrinologist this week  If you have any significant change or worsening of your symptoms do not hesitate to return to the hospital.   Increase activity slowly   Complete by:  As directed      Allergies as of 02/25/2020      Reactions   Penicillins Hives   Did it involve swelling of the face/tongue/throat, SOB, or low BP? N Did it involve sudden or severe rash/hives, skin peeling, or any reaction on the inside of your mouth or nose? Y Did you need to seek medical attention at a hospital or doctor's office? Unknown When did it last happen?childhood If all above answers are "NO", may proceed with cephalosporin use.   Methocarbamol Nausea And Vomiting, Other (See Comments)   Body shaking, also   Azithromycin Hives      Medication List    STOP taking these medications   cefdinir 300 MG capsule Commonly known as: OMNICEF   HYDROcodone-acetaminophen 5-325 MG tablet Commonly known as: NORCO/VICODIN   ondansetron 4 MG tablet Commonly known as: ZOFRAN     TAKE these medications   acetaminophen 325 MG tablet Commonly known as: TYLENOL Take 2 tablets (650 mg total) by mouth every 6 (six) hours as needed for mild pain (or Fever >/= 101).   insulin aspart 100 UNIT/ML FlexPen Commonly known as: NOVOLOG Inject 50 Units into the skin See admin instructions. Maximum of 50 units/day, per insulin pump   insulin pump Soln Inject into the skin continuous.   labetalol 300 MG tablet Commonly known as: NORMODYNE Take 600 mg by mouth 3 (three) times daily.       Allergies  Allergen Reactions  . Penicillins Hives    Did it involve swelling of the face/tongue/throat, SOB, or  low BP? N Did it involve sudden or severe rash/hives, skin peeling, or any reaction on the inside of your mouth or nose? Y Did you need to seek medical attention at a hospital or doctor's office? Unknown When did it last happen?childhood If all above answers are "NO", may proceed with cephalosporin use.   . Methocarbamol Nausea And Vomiting and Other (See Comments)    Body shaking, also  . Azithromycin Hives    Dispo:  Patient From: Home  Planned Disposition: Home  Expected discharge date: 02/25/2020  Medically stable for discharge: No         Time coordinating discharge: Over 30 minutes   SIGNED:   Jae Dire, D.O. [facility] Pager: [phone]  02/25/2020, 1:34 PM

## 2020-02-25 NOTE — Progress Notes (Signed)
Pt wheeled off unit by tech. She had all her belongings in her lap. IV was removed and CCMD was notified upon removal from the monitor.

## 2020-02-27 LAB — GLUCOSE, CAPILLARY: Glucose-Capillary: 448 mg/dL — ABNORMAL HIGH (ref 70–99)

## 2020-03-11 ENCOUNTER — Encounter (HOSPITAL_COMMUNITY): Payer: Self-pay

## 2020-03-11 ENCOUNTER — Emergency Department (HOSPITAL_COMMUNITY)
Admission: EM | Admit: 2020-03-11 | Discharge: 2020-03-11 | Disposition: A | Discharge status: Home / Self Care | Payer: Medicare Other | Attending: Emergency Medicine | Admitting: Emergency Medicine

## 2020-03-11 DIAGNOSIS — Z79899 Other long term (current) drug therapy: Secondary | ICD-10-CM | POA: Diagnosis not present

## 2020-03-11 DIAGNOSIS — Z794 Long term (current) use of insulin: Secondary | ICD-10-CM | POA: Diagnosis not present

## 2020-03-11 DIAGNOSIS — E1010 Type 1 diabetes mellitus with ketoacidosis without coma: Secondary | ICD-10-CM | POA: Insufficient documentation

## 2020-03-11 DIAGNOSIS — I10 Essential (primary) hypertension: Secondary | ICD-10-CM | POA: Insufficient documentation

## 2020-03-11 DIAGNOSIS — R112 Nausea with vomiting, unspecified: Secondary | ICD-10-CM | POA: Insufficient documentation

## 2020-03-11 LAB — CBC WITH DIFFERENTIAL/PLATELET
Abs Immature Granulocytes: 0.16 10*3/uL — ABNORMAL HIGH (ref 0.00–0.07)
Basophils Absolute: 0.1 10*3/uL (ref 0.0–0.1)
Basophils Relative: 1 %
Eosinophils Absolute: 0 10*3/uL (ref 0.0–0.5)
Eosinophils Relative: 0 %
HCT: 34.1 % — ABNORMAL LOW (ref 36.0–46.0)
Hemoglobin: 11.3 g/dL — ABNORMAL LOW (ref 12.0–15.0)
Immature Granulocytes: 1 %
Lymphocytes Relative: 5 %
Lymphs Abs: 0.7 10*3/uL (ref 0.7–4.0)
MCH: 26.8 pg (ref 26.0–34.0)
MCHC: 33.1 g/dL (ref 30.0–36.0)
MCV: 80.8 fL (ref 80.0–100.0)
Monocytes Absolute: 0.3 10*3/uL (ref 0.1–1.0)
Monocytes Relative: 2 %
Neutro Abs: 15.3 10*3/uL — ABNORMAL HIGH (ref 1.7–7.7)
Neutrophils Relative %: 91 %
Platelets: 359 10*3/uL (ref 150–400)
RBC: 4.22 MIL/uL (ref 3.87–5.11)
RDW: 14.3 % (ref 11.5–15.5)
WBC: 16.5 10*3/uL — ABNORMAL HIGH (ref 4.0–10.5)
nRBC: 0 % (ref 0.0–0.2)

## 2020-03-11 LAB — URINALYSIS, ROUTINE W REFLEX MICROSCOPIC
Bilirubin Urine: NEGATIVE
Glucose, UA: >=500 mg/dL — AB
Ketones, ur: >80 mg/dL — AB
Leukocytes,Ua: NEGATIVE
Nitrite: NEGATIVE
Protein, ur: 100 mg/dL — AB
Specific Gravity, Urine: 1.02 (ref 1.005–1.030)
pH: 7.5 (ref 5.0–8.0)

## 2020-03-11 LAB — COMPREHENSIVE METABOLIC PANEL
ALT: 18 U/L (ref 0–44)
AST: 31 U/L (ref 15–41)
Albumin: 4.2 g/dL (ref 3.5–5.0)
Alkaline Phosphatase: 108 U/L (ref 38–126)
Anion gap: 16 — ABNORMAL HIGH (ref 5–15)
BUN: 13 mg/dL (ref 6–20)
CO2: 20 mmol/L — ABNORMAL LOW (ref 22–32)
Calcium: 9.5 mg/dL (ref 8.9–10.3)
Chloride: 100 mmol/L (ref 98–111)
Creatinine, Ser: 1.2 mg/dL — ABNORMAL HIGH (ref 0.44–1.00)
GFR, Estimated: >60 mL/min (ref >60)
Glucose, Bld: 339 mg/dL — ABNORMAL HIGH (ref 70–99)
Potassium: 4.1 mmol/L (ref 3.5–5.1)
Sodium: 136 mmol/L (ref 135–145)
Total Bilirubin: 0.9 mg/dL (ref 0.3–1.2)
Total Protein: 7.9 g/dL (ref 6.5–8.1)

## 2020-03-11 LAB — RAPID URINE DRUG SCREEN, HOSP PERFORMED
Amphetamines: NOT DETECTED
Barbiturates: NOT DETECTED
Benzodiazepines: NOT DETECTED
Cocaine: NOT DETECTED
Opiates: NOT DETECTED
Tetrahydrocannabinol: POSITIVE — AB

## 2020-03-11 LAB — I-STAT VENOUS BLOOD GAS, ED
Acid-Base Excess: 1 mmol/L (ref 0.0–2.0)
Bicarbonate: 23.5 mmol/L (ref 20.0–28.0)
Calcium, Ion: 1 mmol/L — ABNORMAL LOW (ref 1.15–1.40)
HCT: 38 % (ref 36.0–46.0)
Hemoglobin: 12.9 g/dL (ref 12.0–15.0)
O2 Saturation: 87 %
Potassium: 5.8 mmol/L — ABNORMAL HIGH (ref 3.5–5.1)
Sodium: 134 mmol/L — ABNORMAL LOW (ref 135–145)
TCO2: 24 mmol/L (ref 22–32)
pCO2, Ven: 29.1 mmHg — ABNORMAL LOW (ref 44.0–60.0)
pH, Ven: 7.516 — ABNORMAL HIGH (ref 7.250–7.430)
pO2, Ven: 46 mmHg — ABNORMAL HIGH (ref 32.0–45.0)

## 2020-03-11 LAB — CBG MONITORING, ED: Glucose-Capillary: 273 mg/dL — ABNORMAL HIGH (ref 70–99)

## 2020-03-11 LAB — URINALYSIS, MICROSCOPIC (REFLEX): Bacteria, UA: NONE SEEN

## 2020-03-11 LAB — BETA-HYDROXYBUTYRIC ACID: Beta-Hydroxybutyric Acid: 1.37 mmol/L — ABNORMAL HIGH (ref 0.05–0.27)

## 2020-03-11 LAB — LIPASE, BLOOD: Lipase: 19 U/L (ref 11–51)

## 2020-03-11 MED ORDER — LABETALOL HCL 5 MG/ML IV SOLN
10.0000 mg | Freq: Once | INTRAVENOUS | Status: AC
  Administered 2020-03-11: 10 mg via INTRAVENOUS
  Filled 2020-03-11: qty 4

## 2020-03-11 MED ORDER — LABETALOL HCL 200 MG PO TABS
600.0000 mg | ORAL_TABLET | Freq: Once | ORAL | Status: AC
  Administered 2020-03-11: 600 mg via ORAL
  Filled 2020-03-11: qty 3

## 2020-03-11 MED ORDER — LACTATED RINGERS IV BOLUS: 1000.0000 mL | Freq: Once | INTRAVENOUS | Status: DC

## 2020-03-11 MED ORDER — SODIUM CHLORIDE 0.9 % IV BOLUS
1000.0000 mL | Freq: Once | INTRAVENOUS | Status: AC
  Administered 2020-03-11: 1000 mL via INTRAVENOUS

## 2020-03-11 MED ORDER — DROPERIDOL 2.5 MG/ML IJ SOLN
1.2500 mg | Freq: Once | INTRAMUSCULAR | Status: AC
  Administered 2020-03-11: 1.25 mg via INTRAVENOUS
  Filled 2020-03-11: qty 2

## 2020-03-11 MED ORDER — FENTANYL CITRATE (PF) 100 MCG/2ML IJ SOLN
50.0000 ug | Freq: Once | INTRAMUSCULAR | Status: AC
  Administered 2020-03-11: 50 ug via INTRAVENOUS
  Filled 2020-03-11: qty 2

## 2020-03-11 MED ORDER — ONDANSETRON HCL 4 MG/2ML IJ SOLN
4.0000 mg | Freq: Once | INTRAMUSCULAR | Status: AC
  Administered 2020-03-11: 4 mg via INTRAVENOUS

## 2020-03-11 MED ORDER — KETOROLAC TROMETHAMINE 15 MG/ML IJ SOLN
15.0000 mg | Freq: Once | INTRAMUSCULAR | Status: DC
  Filled 2020-03-11: qty 1

## 2020-03-11 MED ORDER — ONDANSETRON 4 MG PO TBDP: 4.0000 mg | ORAL_TABLET | Freq: Three times a day (TID) | ORAL | 0 refills | Status: AC | PRN

## 2020-03-11 NOTE — ED Triage Notes (Signed)
Pt arrived via [facility] from home for c/o N/V started today. Per EMS pt told them she hasn't been able to get bs regulated since December and her insulin pump gave insulin once today and hasn't had any additional insulin. Pt is A&Ox4.Per EMS pt has peaked T waves and prolonged AT interval so they did not give anything.

## 2020-03-11 NOTE — ED Provider Notes (Signed)
[facility] EMERGENCY DEPARTMENT Provider Note   CSN: [id number] Arrival date & time: 03/11/20  1528     History Chief Complaint  Patient presents with  . Emesis    Tamara Lane is a 35 y.o. female with a h/o T1DM on insulin pump, gastroparesis, and HTN, and recent admission for DKA 2 weeks ago who presents to the ED via EMS for nausea and vomiting. Symptoms began this AM after patient woke up. Went to bed last night in her usual state of health. Glucose 100s earlier today. Patient states her insulin pump fell out while she was taking a shower and she did not put it back in place because she was feeling bad at that time. Vomiting continued during the day with associated low back pain, prompting patient to call EMS. EMS transported patient to [facility] ED for further evaluation. Glucose noted to be in 300s. Denies recent illnesses or suspicious food intake. Denies fever, chills, vision changes, headache, chest pain, SOB, abdominal pain, diarrhea, or urinary symptoms. She has not taken her BP meds today.  The history is provided by the patient and medical records.  Emesis Severity:  Moderate Duration:  1 day Timing:  Constant Number of daily episodes:  Multiple Quality:  Stomach contents Progression:  Unchanged Chronicity:  New Recent urination:  Normal Relieved by:  None tried Worsened by:  Nothing Ineffective treatments:  None tried Associated symptoms: no abdominal pain, no arthralgias, no chills, no cough, no diarrhea, no fever, no headaches, no sore throat and no URI   Risk factors: diabetes   Risk factors: no sick contacts and no travel to endemic areas        Past Medical History:  Diagnosis Date  . Diabetes mellitus without complication (HCC)   . Gastroparesis   . Hypertension   . Marijuana abuse 01/13/2020  . Renal insufficiency affecting pregnancy     Patient Active Problem List   Diagnosis Date Noted  . DKA, type 1 (HCC) 02/23/2020  .  Hypertensive emergency 02/23/2020  . Pyelonephritis 01/14/2020  . Cystitis 01/13/2020  . Type 1 diabetes mellitus with hyperglycemia, with long-term current use of insulin (HCC) 01/13/2020  . Diabetic gastroparesis (HCC) 01/13/2020  . Essential hypertension 01/13/2020  . Marijuana abuse 01/13/2020    Past Surgical History:  Procedure Laterality Date  . CESAREAN SECTION    . GASTRIC STIMULATOR IMPLANT SURGERY  2016     OB History    Gravida  2   Para  1   Term      Preterm  1   AB      Living  1     SAB      IAB      Ectopic      Multiple      Live Births  1           No family history on file.  Social History   Tobacco Use  . Smoking status: Never Smoker  . Smokeless tobacco: Never Used  Substance Use Topics  . Alcohol use: Not Currently  . Drug use: Yes    Types: Marijuana    Comment: intermittent use    Home Medications Prior to Admission medications   Medication Sig Start Date End Date Taking? Authorizing Provider  ondansetron (ZOFRAN ODT) 4 MG disintegrating tablet Take 1 tablet (4 mg total) by mouth every 8 (eight) hours as needed for up to 3 days for nausea or vomiting. 03/11/20 03/14/20 Yes Mellody Dance,  Joselyn Glassman, MD  acetaminophen (TYLENOL) 325 MG tablet Take 2 tablets (650 mg total) by mouth every 6 (six) hours as needed for mild pain (or Fever >/= 101). 01/16/20   Joseph Art, DO  insulin aspart (NOVOLOG) 100 UNIT/ML FlexPen Inject 50 Units into the skin See admin instructions. Maximum of 50 units/day, per insulin pump 06/02/18   [provider]  Insulin Human (INSULIN PUMP) SOLN Inject into the skin continuous.    [provider]  labetalol (NORMODYNE) 300 MG tablet Take 600 mg by mouth 3 (three) times daily.    [provider]    Allergies    Penicillins, Methocarbamol, and Azithromycin  Review of Systems   Review of Systems  Constitutional: Negative for chills and fever.  HENT: Negative for ear pain and sore  throat.   Eyes: Negative for pain and visual disturbance.  Respiratory: Negative for cough and shortness of breath.   Cardiovascular: Negative for chest pain and palpitations.  Gastrointestinal: Positive for nausea and vomiting. Negative for abdominal pain and diarrhea.  Genitourinary: Negative for dysuria and hematuria.  Musculoskeletal: Positive for back pain. Negative for arthralgias.  Skin: Negative for color change and rash.  Neurological: Negative for seizures, syncope and headaches.  All other systems reviewed and are negative.   Physical Exam Updated Vital Signs BP (!) 203/108   Pulse (!) 107   Temp 98.3 F (36.8 C) (Oral)   Resp 18   Ht 5\' 6"  (1.676 m)   Wt 55.3 kg   SpO2 100%   BMI 19.68 kg/m   Physical Exam Vitals and nursing note reviewed.  Constitutional:      General: She is not in acute distress.    Appearance: She is well-developed, normal weight and well-nourished. She is ill-appearing.  HENT:     Head: Normocephalic and atraumatic.     Right Ear: External ear normal.     Left Ear: External ear normal.     Nose: Nose normal.     Mouth/Throat:     Mouth: Mucous membranes are moist.     Pharynx: Oropharynx is clear. No oropharyngeal exudate or posterior oropharyngeal erythema.  Eyes:     General: No scleral icterus.       Right eye: No discharge.        Left eye: No discharge.     Conjunctiva/sclera: Conjunctivae normal.  Cardiovascular:     Rate and Rhythm: Normal rate and regular rhythm.     Heart sounds: No murmur heard.   Pulmonary:     Effort: Pulmonary effort is normal. No respiratory distress.     Breath sounds: Normal breath sounds.  Abdominal:     General: Abdomen is flat. There is no distension.     Palpations: Abdomen is soft.     Tenderness: There is no abdominal tenderness. There is no guarding or rebound.  Musculoskeletal:        General: No tenderness, signs of injury or edema.     Cervical back: Neck supple.  Skin:    General:  Skin is warm and dry.     Findings: No rash.  Neurological:     General: No focal deficit present.     Mental Status: She is alert and oriented to person, place, and time.     Sensory: No sensory deficit.     Motor: No weakness.  Psychiatric:        Mood and Affect: Mood and affect normal.     ED Results /  Procedures / Treatments   Labs (all labs ordered are listed, but only abnormal results are displayed) Labs Reviewed  CBC WITH DIFFERENTIAL/PLATELET - Abnormal; Notable for the following components:      Result Value   WBC 16.5 (*)    Hemoglobin 11.3 (*)    HCT 34.1 (*)    Neutro Abs 15.3 (*)    Abs Immature Granulocytes 0.16 (*)    All other components within normal limits  COMPREHENSIVE METABOLIC PANEL - Abnormal; Notable for the following components:   CO2 20 (*)    Glucose, Bld 339 (*)    Creatinine, Ser 1.20 (*)    Anion gap 16 (*)    All other components within normal limits  URINALYSIS, ROUTINE W REFLEX MICROSCOPIC - Abnormal; Notable for the following components:   Glucose, UA >=500 (*)    Hgb urine dipstick SMALL (*)    Ketones, ur >80 (*)    Protein, ur 100 (*)    All other components within normal limits  BETA-HYDROXYBUTYRIC ACID - Abnormal; Notable for the following components:   Beta-Hydroxybutyric Acid 1.37 (*)    All other components within normal limits  RAPID URINE DRUG SCREEN, HOSP PERFORMED - Abnormal; Notable for the following components:   Tetrahydrocannabinol POSITIVE (*)    All other components within normal limits  CBG MONITORING, ED - Abnormal; Notable for the following components:   Glucose-Capillary 273 (*)    All other components within normal limits  I-STAT VENOUS BLOOD GAS, ED - Abnormal; Notable for the following components:   pH, Ven 7.516 (*)    pCO2, Ven 29.1 (*)    pO2, Ven 46.0 (*)    Sodium 134 (*)    Potassium 5.8 (*)    Calcium, Ion 1.00 (*)    All other components within normal limits  LIPASE, BLOOD  URINALYSIS,  MICROSCOPIC (REFLEX)    EKG EKG Interpretation  Date/Time:  Sunday March 11 2020 15:41:41 EST Ventricular Rate:  91 PR Interval:    QRS Duration: 74 QT Interval:  397 QTC Calculation: 489 R Axis:   78 Text Interpretation: Sinus rhythm Left atrial enlargement Left ventricular hypertrophy Nonspecific T abnrm, anterolateral leads Confirmed by Virgina Norfolk [phone]) on 03/11/2020 4:04:56 PM   Radiology No results found.  Procedures Procedures  Medications Ordered in ED Medications  lactated ringers bolus 1,000 mL (1,000 mLs Intravenous Not Given 03/11/20 2005)  ketorolac (TORADOL) 15 MG/ML injection 15 mg (15 mg Intravenous Not Given 03/11/20 1948)  sodium chloride 0.9 % bolus 1,000 mL (0 mLs Intravenous Stopped 03/11/20 1954)  ondansetron (ZOFRAN) injection 4 mg (4 mg Intravenous Given 03/11/20 1634)  labetalol (NORMODYNE) injection 10 mg (10 mg Intravenous Given 03/11/20 1641)  droperidol (INAPSINE) 2.5 MG/ML injection 1.25 mg (1.25 mg Intravenous Given 03/11/20 1650)  fentaNYL (SUBLIMAZE) injection 50 mcg (50 mcg Intravenous Given 03/11/20 1752)  labetalol (NORMODYNE) injection 10 mg (10 mg Intravenous Given 03/11/20 1828)  labetalol (NORMODYNE) tablet 600 mg (600 mg Oral Given 03/11/20 2021)    ED Course  I have reviewed the triage vital signs and the nursing notes.  Pertinent labs & imaging results that were available during my care of the patient were reviewed by me and considered in my medical decision making (see chart for details).    MDM Rules/Calculators/A&P                          Patient is a 34yoF with a history and  physical as described above who presents to the ED for nausea and vomiting. Patient ill-appearing on exam and mentating appropriately. VS notable for hypertension, but otherwise HDS. Initial workup includes CBC, CMP, lipase, beta-hydroxy, UA, VBG, and ECG. Initial treatment includes IVF bolus, droperidol, Zofran, and fentanyl.  On reassessment, patient  states she feels significantly better and tolerating PO. Diagnostic workup not consistent with DKA, HHS, electrolyte derangement, sepsis, UTI, pyelonephritis, cholecystitis, or pancreatitis. Suspect presentation likely 2/2 gastroparesis vs cannabinoid hyperemesis. Elevated ketones and slightly decreased bicarb likely 2/2 starvation ketoacidosis in setting of vomiting and poor PO intake. Given significant improvement of symptoms, patient appropriate for discharge. Home-dose labetalol given for BP control.  Strict return precautions provided and discussed. Questions and concerns addressed. Recommend close follow up with PCP on outpatient basis. Patient verbalized understanding and amenable with discharge plan. Patient discharged in stable condition. Final Clinical Impression(s) / ED Diagnoses Final diagnoses:  Non-intractable vomiting with nausea, unspecified vomiting type    Rx / DC Orders ED Discharge Orders         Ordered    ondansetron (ZOFRAN ODT) 4 MG disintegrating tablet  Every 8 hours PRN        03/11/20 2020           Tonia Brooms, MD 03/12/20 0115    Virgina Norfolk, DO 03/12/20 2334

## 2020-03-11 NOTE — ED Provider Notes (Incomplete)
[facility] EMERGENCY DEPARTMENT Provider Note   CSN: [id number] Arrival date & time: 03/11/20  1528     History Chief Complaint  Patient presents with  . Emesis    Tamara Lane is a 35 y.o. female with a h/o T1DM on insulin pump, gastroparesis, and HTN, and recent admission for DKA 2 weeks ago who presents to the ED via EMS for nausea and vomiting. Symptoms began this AM after patient woke up. Went to bed last night in her usual state of health. Glucose 100s earlier today. Patient states her insulin pump fell out while she was taking a shower and she did not put it back in place because she was feeling bad at that time. Vomiting continued during the day with associated low back pain, prompting patient to call EMS. EMS transported patient to [facility] ED for further evaluation. Glucose noted to be in 300s. Denies recent illnesses or suspicious food intake. Denies fever, chills, vision changes, headache, chest pain, SOB, abdominal pain, diarrhea, or urinary symptoms. She has not taken her BP meds today.  The history is provided by the patient and medical records.  Emesis Severity:  Moderate Duration:  1 day Timing:  Constant Number of daily episodes:  Multiple Quality:  Stomach contents Progression:  Unchanged Chronicity:  New Recent urination:  Normal Relieved by:  None tried Worsened by:  Nothing Ineffective treatments:  None tried Associated symptoms: no abdominal pain, no arthralgias, no chills, no cough, no diarrhea, no fever, no headaches, no sore throat and no URI   Risk factors: diabetes   Risk factors: no sick contacts and no travel to endemic areas        Past Medical History:  Diagnosis Date  . Diabetes mellitus without complication (HCC)   . Gastroparesis   . Hypertension   . Marijuana abuse 01/13/2020  . Renal insufficiency affecting pregnancy     Patient Active Problem List   Diagnosis Date Noted  . DKA, type 1 (HCC) 02/23/2020  .  Hypertensive emergency 02/23/2020  . Pyelonephritis 01/14/2020  . Cystitis 01/13/2020  . Type 1 diabetes mellitus with hyperglycemia, with long-term current use of insulin (HCC) 01/13/2020  . Diabetic gastroparesis (HCC) 01/13/2020  . Essential hypertension 01/13/2020  . Marijuana abuse 01/13/2020    Past Surgical History:  Procedure Laterality Date  . CESAREAN SECTION    . GASTRIC STIMULATOR IMPLANT SURGERY  2016     OB History    Gravida  2   Para  1   Term      Preterm  1   AB      Living  1     SAB      IAB      Ectopic      Multiple      Live Births  1           No family history on file.  Social History   Tobacco Use  . Smoking status: Never Smoker  . Smokeless tobacco: Never Used  Substance Use Topics  . Alcohol use: Not Currently  . Drug use: Yes    Types: Marijuana    Comment: intermittent use    Home Medications Prior to Admission medications   Medication Sig Start Date End Date Taking? Authorizing Provider  ondansetron (ZOFRAN ODT) 4 MG disintegrating tablet Take 1 tablet (4 mg total) by mouth every 8 (eight) hours as needed for up to 3 days for nausea or vomiting. 03/11/20 03/14/20 Yes Mellody Dance,  Joselyn Glassman, MD  acetaminophen (TYLENOL) 325 MG tablet Take 2 tablets (650 mg total) by mouth every 6 (six) hours as needed for mild pain (or Fever >/= 101). 01/16/20   Joseph Art, DO  insulin aspart (NOVOLOG) 100 UNIT/ML FlexPen Inject 50 Units into the skin See admin instructions. Maximum of 50 units/day, per insulin pump 06/02/18   [provider]  Insulin Human (INSULIN PUMP) SOLN Inject into the skin continuous.    [provider]  labetalol (NORMODYNE) 300 MG tablet Take 600 mg by mouth 3 (three) times daily.    [provider]    Allergies    Penicillins, Methocarbamol, and Azithromycin  Review of Systems   Review of Systems  Constitutional: Negative for chills and fever.  HENT: Negative for ear pain and sore  throat.   Eyes: Negative for pain and visual disturbance.  Respiratory: Negative for cough and shortness of breath.   Cardiovascular: Negative for chest pain and palpitations.  Gastrointestinal: Positive for nausea and vomiting. Negative for abdominal pain and diarrhea.  Genitourinary: Negative for dysuria and hematuria.  Musculoskeletal: Positive for back pain. Negative for arthralgias.  Skin: Negative for color change and rash.  Neurological: Negative for seizures, syncope and headaches.  All other systems reviewed and are negative.   Physical Exam Updated Vital Signs BP (!) 186/103   Pulse 92   Temp 98.3 F (36.8 C) (Oral)   Resp 19   Ht 5\' 6"  (1.676 m)   Wt 55.3 kg   SpO2 99%   BMI 19.68 kg/m   Physical Exam Vitals and nursing note reviewed.  Constitutional:      General: She is not in acute distress.    Appearance: She is well-developed, normal weight and well-nourished. She is ill-appearing.  HENT:     Head: Normocephalic and atraumatic.     Right Ear: External ear normal.     Left Ear: External ear normal.     Nose: Nose normal.     Mouth/Throat:     Mouth: Mucous membranes are moist.     Pharynx: Oropharynx is clear. No oropharyngeal exudate or posterior oropharyngeal erythema.  Eyes:     General: No scleral icterus.       Right eye: No discharge.        Left eye: No discharge.     Conjunctiva/sclera: Conjunctivae normal.  Cardiovascular:     Rate and Rhythm: Normal rate and regular rhythm.     Heart sounds: No murmur heard.   Pulmonary:     Effort: Pulmonary effort is normal. No respiratory distress.     Breath sounds: Normal breath sounds.  Abdominal:     Palpations: Abdomen is soft.     Tenderness: There is no abdominal tenderness.  Musculoskeletal:        General: No edema.     Cervical back: Neck supple.  Skin:    General: Skin is warm and dry.  Neurological:     Mental Status: She is alert.  Psychiatric:        Mood and Affect: Mood and  affect normal.     ED Results / Procedures / Treatments   Labs (all labs ordered are listed, but only abnormal results are displayed) Labs Reviewed  CBC WITH DIFFERENTIAL/PLATELET - Abnormal; Notable for the following components:      Result Value   WBC 16.5 (*)    Hemoglobin 11.3 (*)    HCT 34.1 (*)    Neutro Abs 15.3 (*)  Abs Immature Granulocytes 0.16 (*)    All other components within normal limits  COMPREHENSIVE METABOLIC PANEL - Abnormal; Notable for the following components:   CO2 20 (*)    Glucose, Bld 339 (*)    Creatinine, Ser 1.20 (*)    Anion gap 16 (*)    All other components within normal limits  URINALYSIS, ROUTINE W REFLEX MICROSCOPIC - Abnormal; Notable for the following components:   Glucose, UA >=500 (*)    Hgb urine dipstick SMALL (*)    Ketones, ur >80 (*)    Protein, ur 100 (*)    All other components within normal limits  BETA-HYDROXYBUTYRIC ACID - Abnormal; Notable for the following components:   Beta-Hydroxybutyric Acid 1.37 (*)    All other components within normal limits  RAPID URINE DRUG SCREEN, HOSP PERFORMED - Abnormal; Notable for the following components:   Tetrahydrocannabinol POSITIVE (*)    All other components within normal limits  CBG MONITORING, ED - Abnormal; Notable for the following components:   Glucose-Capillary 273 (*)    All other components within normal limits  I-STAT VENOUS BLOOD GAS, ED - Abnormal; Notable for the following components:   pH, Ven 7.516 (*)    pCO2, Ven 29.1 (*)    pO2, Ven 46.0 (*)    Sodium 134 (*)    Potassium 5.8 (*)    Calcium, Ion 1.00 (*)    All other components within normal limits  LIPASE, BLOOD  URINALYSIS, MICROSCOPIC (REFLEX)    EKG EKG Interpretation  Date/Time:  Sunday March 11 2020 15:41:41 EST Ventricular Rate:  91 PR Interval:    QRS Duration: 74 QT Interval:  397 QTC Calculation: 489 R Axis:   78 Text Interpretation: Sinus rhythm Left atrial enlargement Left ventricular  hypertrophy Nonspecific T abnrm, anterolateral leads Confirmed by Virgina Norfolk [phone]) on 03/11/2020 4:04:56 PM   Radiology No results found.  Procedures Procedures {Remember to document critical care time when appropriate:1}  Medications Ordered in ED Medications  lactated ringers bolus 1,000 mL (1,000 mLs Intravenous Not Given 03/11/20 2005)  ketorolac (TORADOL) 15 MG/ML injection 15 mg (15 mg Intravenous Not Given 03/11/20 1948)  labetalol (NORMODYNE) tablet 600 mg (has no administration in time range)  sodium chloride 0.9 % bolus 1,000 mL (0 mLs Intravenous Stopped 03/11/20 1954)  ondansetron (ZOFRAN) injection 4 mg (4 mg Intravenous Given 03/11/20 1634)  labetalol (NORMODYNE) injection 10 mg (10 mg Intravenous Given 03/11/20 1641)  droperidol (INAPSINE) 2.5 MG/ML injection 1.25 mg (1.25 mg Intravenous Given 03/11/20 1650)  fentaNYL (SUBLIMAZE) injection 50 mcg (50 mcg Intravenous Given 03/11/20 1752)  labetalol (NORMODYNE) injection 10 mg (10 mg Intravenous Given 03/11/20 1828)    ED Course  I have reviewed the triage vital signs and the nursing notes.  Pertinent labs & imaging results that were available during my care of the patient were reviewed by me and considered in my medical decision making (see chart for details).    MDM Rules/Calculators/A&P                          Patient is a 34yoF with a history and physical as described above who presents to the ED for nausea and vomiting. Patient ill-a[ Final Clinical Impression(s) / ED Diagnoses Final diagnoses:  Non-intractable vomiting with nausea, unspecified vomiting type    Rx / DC Orders ED Discharge Orders         Ordered    ondansetron (ZOFRAN ODT)  4 MG disintegrating tablet  Every 8 hours PRN        03/11/20 2020

## 2020-03-11 NOTE — ED Provider Notes (Signed)
Medical screening examination/treatment/procedure(s) were performed by non-physician practitioner and as supervising physician I was immediately available for consultation/collaboration.  Patient with history of diabetes on insulin, gastroparesis who presents the ED with nausea and vomiting.  Patient unable to tolerate her medications.  Insulin pump fell off today and has not been very compliant with her insulin.  She has not been able to keep anything down.  She admits to occasional marijuana use.  Does not have any abdominal tenderness.  Overall suspect gastroparesis versus hyperemesis from marijuana use.  Could be DKA.  Will get lab work.  Will give fluid bolus and antiemetics and reevaluate.  Even if not in DKA may need admission for symptomatic care.  Low suspicion for appendicitis of obstruction at this time.  Please see resident note for further results, evaluation, disposition of patient  This chart was dictated using voice recognition software.  Despite best efforts to proofread,  errors can occur which can change the documentation meaning.     EKG Interpretation  Date/Time:  Sunday March 11 2020 15:41:41 EST Ventricular Rate:  91 PR Interval:    QRS Duration: 74 QT Interval:  397 QTC Calculation: 489 R Axis:   78 Text Interpretation: Sinus rhythm Left atrial enlargement Left ventricular hypertrophy Nonspecific T abnrm, anterolateral leads Confirmed by Virgina Norfolk [phone]) on 03/11/2020 4:04:56 PM           Virgina Norfolk, DO 03/11/20 1742

## 2020-04-27 ENCOUNTER — Encounter (HOSPITAL_COMMUNITY): Payer: Self-pay

## 2020-04-27 ENCOUNTER — Emergency Department (HOSPITAL_COMMUNITY): Payer: Medicare Other

## 2020-04-27 ENCOUNTER — Other Ambulatory Visit: Payer: Self-pay

## 2020-04-27 ENCOUNTER — Inpatient Hospital Stay (HOSPITAL_COMMUNITY)
Admission: EM | Admit: 2020-04-27 | Discharge: 2020-04-30 | DRG: 832 | Disposition: A | Discharge status: Home / Self Care | Payer: Medicare Other | Attending: Internal Medicine | Admitting: Internal Medicine

## 2020-04-27 DIAGNOSIS — N10 Acute tubulo-interstitial nephritis: Secondary | ICD-10-CM | POA: Insufficient documentation

## 2020-04-27 DIAGNOSIS — Z3201 Encounter for pregnancy test, result positive: Secondary | ICD-10-CM

## 2020-04-27 DIAGNOSIS — N390 Urinary tract infection, site not specified: Secondary | ICD-10-CM | POA: Diagnosis present

## 2020-04-27 DIAGNOSIS — O2301 Infections of kidney in pregnancy, first trimester: Secondary | ICD-10-CM | POA: Diagnosis present

## 2020-04-27 DIAGNOSIS — O10011 Pre-existing essential hypertension complicating pregnancy, first trimester: Secondary | ICD-10-CM | POA: Diagnosis present

## 2020-04-27 DIAGNOSIS — D631 Anemia in chronic kidney disease: Secondary | ICD-10-CM | POA: Diagnosis present

## 2020-04-27 DIAGNOSIS — R109 Unspecified abdominal pain: Secondary | ICD-10-CM

## 2020-04-27 DIAGNOSIS — Z3A01 Less than 8 weeks gestation of pregnancy: Secondary | ICD-10-CM

## 2020-04-27 DIAGNOSIS — E1143 Type 2 diabetes mellitus with diabetic autonomic (poly)neuropathy: Secondary | ICD-10-CM | POA: Diagnosis present

## 2020-04-27 DIAGNOSIS — Z794 Long term (current) use of insulin: Secondary | ICD-10-CM

## 2020-04-27 DIAGNOSIS — O24011 Pre-existing diabetes mellitus, type 1, in pregnancy, first trimester: Secondary | ICD-10-CM | POA: Diagnosis not present

## 2020-04-27 DIAGNOSIS — I10 Essential (primary) hypertension: Secondary | ICD-10-CM | POA: Diagnosis present

## 2020-04-27 DIAGNOSIS — R112 Nausea with vomiting, unspecified: Secondary | ICD-10-CM | POA: Diagnosis not present

## 2020-04-27 DIAGNOSIS — E1065 Type 1 diabetes mellitus with hyperglycemia: Secondary | ICD-10-CM | POA: Diagnosis present

## 2020-04-27 DIAGNOSIS — Z20822 Contact with and (suspected) exposure to covid-19: Secondary | ICD-10-CM | POA: Diagnosis present

## 2020-04-27 DIAGNOSIS — F1210 Cannabis abuse, uncomplicated: Secondary | ICD-10-CM | POA: Diagnosis present

## 2020-04-27 DIAGNOSIS — E871 Hypo-osmolality and hyponatremia: Secondary | ICD-10-CM | POA: Diagnosis present

## 2020-04-27 DIAGNOSIS — O26831 Pregnancy related renal disease, first trimester: Secondary | ICD-10-CM | POA: Diagnosis present

## 2020-04-27 DIAGNOSIS — O99321 Drug use complicating pregnancy, first trimester: Secondary | ICD-10-CM | POA: Diagnosis present

## 2020-04-27 DIAGNOSIS — K3184 Gastroparesis: Secondary | ICD-10-CM | POA: Diagnosis present

## 2020-04-27 DIAGNOSIS — N1831 Chronic kidney disease, stage 3a: Secondary | ICD-10-CM | POA: Diagnosis present

## 2020-04-27 DIAGNOSIS — E1043 Type 1 diabetes mellitus with diabetic autonomic (poly)neuropathy: Secondary | ICD-10-CM | POA: Diagnosis present

## 2020-04-27 DIAGNOSIS — E10649 Type 1 diabetes mellitus with hypoglycemia without coma: Secondary | ICD-10-CM | POA: Diagnosis not present

## 2020-04-27 DIAGNOSIS — N12 Tubulo-interstitial nephritis, not specified as acute or chronic: Secondary | ICD-10-CM

## 2020-04-27 DIAGNOSIS — E1022 Type 1 diabetes mellitus with diabetic chronic kidney disease: Secondary | ICD-10-CM | POA: Diagnosis present

## 2020-04-27 DIAGNOSIS — E876 Hypokalemia: Secondary | ICD-10-CM | POA: Diagnosis present

## 2020-04-27 DIAGNOSIS — E872 Acidosis: Secondary | ICD-10-CM | POA: Diagnosis present

## 2020-04-27 DIAGNOSIS — Z9641 Presence of insulin pump (external) (internal): Secondary | ICD-10-CM | POA: Diagnosis present

## 2020-04-27 DIAGNOSIS — O99281 Endocrine, nutritional and metabolic diseases complicating pregnancy, first trimester: Secondary | ICD-10-CM | POA: Diagnosis present

## 2020-04-27 DIAGNOSIS — I169 Hypertensive crisis, unspecified: Secondary | ICD-10-CM | POA: Diagnosis present

## 2020-04-27 DIAGNOSIS — I129 Hypertensive chronic kidney disease with stage 1 through stage 4 chronic kidney disease, or unspecified chronic kidney disease: Secondary | ICD-10-CM | POA: Diagnosis present

## 2020-04-27 LAB — URINALYSIS, ROUTINE W REFLEX MICROSCOPIC
Bilirubin Urine: NEGATIVE
Glucose, UA: >=500 mg/dL — AB
Hgb urine dipstick: NEGATIVE
Ketones, ur: 80 mg/dL — AB
Leukocytes,Ua: NEGATIVE
Nitrite: POSITIVE — AB
Protein, ur: 100 mg/dL — AB
Specific Gravity, Urine: 1.021 (ref 1.005–1.030)
pH: 6 (ref 5.0–8.0)

## 2020-04-27 LAB — HEMOGLOBIN A1C
Hgb A1c MFr Bld: 9.1 % — ABNORMAL HIGH (ref 4.8–5.6)
Mean Plasma Glucose: 214.47 mg/dL

## 2020-04-27 LAB — I-STAT BETA HCG BLOOD, ED (MC, WL, AP ONLY)
I-stat hCG, quantitative: 516.9 m[IU]/mL — ABNORMAL HIGH (ref <5)
I-stat hCG, quantitative: 575.3 m[IU]/mL — ABNORMAL HIGH (ref <5)

## 2020-04-27 LAB — COMPREHENSIVE METABOLIC PANEL
ALT: 10 U/L (ref 0–44)
AST: 18 U/L (ref 15–41)
Albumin: 3.9 g/dL (ref 3.5–5.0)
Alkaline Phosphatase: 78 U/L (ref 38–126)
Anion gap: 12 (ref 5–15)
BUN: 10 mg/dL (ref 6–20)
CO2: 18 mmol/L — ABNORMAL LOW (ref 22–32)
Calcium: 9 mg/dL (ref 8.9–10.3)
Chloride: 103 mmol/L (ref 98–111)
Creatinine, Ser: 1.24 mg/dL — ABNORMAL HIGH (ref 0.44–1.00)
GFR, Estimated: 59 mL/min — ABNORMAL LOW (ref >60)
Glucose, Bld: 252 mg/dL — ABNORMAL HIGH (ref 70–99)
Potassium: 3.8 mmol/L (ref 3.5–5.1)
Sodium: 133 mmol/L — ABNORMAL LOW (ref 135–145)
Total Bilirubin: 0.9 mg/dL (ref 0.3–1.2)
Total Protein: 7 g/dL (ref 6.5–8.1)

## 2020-04-27 LAB — CBC WITH DIFFERENTIAL/PLATELET
Abs Immature Granulocytes: 0.03 10*3/uL (ref 0.00–0.07)
Basophils Absolute: 0.1 10*3/uL (ref 0.0–0.1)
Basophils Relative: 1 %
Eosinophils Absolute: 0 10*3/uL (ref 0.0–0.5)
Eosinophils Relative: 0 %
HCT: 32.9 % — ABNORMAL LOW (ref 36.0–46.0)
Hemoglobin: 10.6 g/dL — ABNORMAL LOW (ref 12.0–15.0)
Immature Granulocytes: 0 %
Lymphocytes Relative: 24 %
Lymphs Abs: 2.8 10*3/uL (ref 0.7–4.0)
MCH: 26 pg (ref 26.0–34.0)
MCHC: 32.2 g/dL (ref 30.0–36.0)
MCV: 80.6 fL (ref 80.0–100.0)
Monocytes Absolute: 0.8 10*3/uL (ref 0.1–1.0)
Monocytes Relative: 6 %
Neutro Abs: 8.2 10*3/uL — ABNORMAL HIGH (ref 1.7–7.7)
Neutrophils Relative %: 69 %
Platelets: 280 10*3/uL (ref 150–400)
RBC: 4.08 MIL/uL (ref 3.87–5.11)
RDW: 15.3 % (ref 11.5–15.5)
WBC: 11.9 10*3/uL — ABNORMAL HIGH (ref 4.0–10.5)
nRBC: 0 % (ref 0.0–0.2)

## 2020-04-27 LAB — RAPID URINE DRUG SCREEN, HOSP PERFORMED
Amphetamines: NOT DETECTED
Barbiturates: NOT DETECTED
Benzodiazepines: NOT DETECTED
Cocaine: NOT DETECTED
Opiates: NOT DETECTED
Tetrahydrocannabinol: POSITIVE — AB

## 2020-04-27 LAB — BASIC METABOLIC PANEL
Anion gap: 10 (ref 5–15)
BUN: 11 mg/dL (ref 6–20)
CO2: 22 mmol/L (ref 22–32)
Calcium: 9.3 mg/dL (ref 8.9–10.3)
Chloride: 101 mmol/L (ref 98–111)
Creatinine, Ser: 1.14 mg/dL — ABNORMAL HIGH (ref 0.44–1.00)
GFR, Estimated: >60 mL/min (ref >60)
Glucose, Bld: 106 mg/dL — ABNORMAL HIGH (ref 70–99)
Potassium: 3.7 mmol/L (ref 3.5–5.1)
Sodium: 133 mmol/L — ABNORMAL LOW (ref 135–145)

## 2020-04-27 LAB — SARS CORONAVIRUS 2 (TAT 6-24 HRS): SARS Coronavirus 2: NEGATIVE

## 2020-04-27 LAB — GLUCOSE, CAPILLARY: Glucose-Capillary: 275 mg/dL — ABNORMAL HIGH (ref 70–99)

## 2020-04-27 LAB — CBG MONITORING, ED
Glucose-Capillary: 258 mg/dL — ABNORMAL HIGH (ref 70–99)
Glucose-Capillary: 325 mg/dL — ABNORMAL HIGH (ref 70–99)
Glucose-Capillary: 240 mg/dL — ABNORMAL HIGH (ref 70–99)
Glucose-Capillary: 101 mg/dL — ABNORMAL HIGH (ref 70–99)

## 2020-04-27 LAB — HCG, QUANTITATIVE, PREGNANCY: hCG, Beta Chain, Quant, S: 651 m[IU]/mL — ABNORMAL HIGH (ref <5)

## 2020-04-27 MED ORDER — LABETALOL HCL 300 MG PO TABS
600.0000 mg | ORAL_TABLET | Freq: Three times a day (TID) | ORAL | Status: DC
  Administered 2020-04-28 – 2020-04-30 (×8): 600 mg via ORAL
  Filled 2020-04-27 (×8): qty 2

## 2020-04-27 MED ORDER — ACETAMINOPHEN 325 MG PO TABS
650.0000 mg | ORAL_TABLET | Freq: Four times a day (QID) | ORAL | Status: DC | PRN
  Administered 2020-04-29: 650 mg via ORAL
  Filled 2020-04-27: qty 2

## 2020-04-27 MED ORDER — LACTATED RINGERS IV SOLN: INTRAVENOUS | Status: DC

## 2020-04-27 MED ORDER — INSULIN GLARGINE 100 UNIT/ML ~~LOC~~ SOLN
14.0000 [IU] | Freq: Once | SUBCUTANEOUS | Status: AC
  Administered 2020-04-27: 14 [IU] via SUBCUTANEOUS
  Filled 2020-04-27: qty 0.14

## 2020-04-27 MED ORDER — ACETAMINOPHEN 650 MG RE SUPP: 650.0000 mg | Freq: Four times a day (QID) | RECTAL | Status: DC | PRN

## 2020-04-27 MED ORDER — SODIUM CHLORIDE 0.9 % IV SOLN
2.0000 g | INTRAVENOUS | Status: DC
  Administered 2020-04-28 – 2020-04-29 (×2): 2 g via INTRAVENOUS
  Filled 2020-04-27 (×3): qty 20

## 2020-04-27 MED ORDER — DIPHENHYDRAMINE HCL 50 MG/ML IJ SOLN
12.5000 mg | Freq: Once | INTRAMUSCULAR | Status: AC
  Administered 2020-04-27: 12.5 mg via INTRAVENOUS
  Filled 2020-04-27: qty 1

## 2020-04-27 MED ORDER — LACTATED RINGERS IV BOLUS
1000.0000 mL | Freq: Once | INTRAVENOUS | Status: AC
  Administered 2020-04-27: 1000 mL via INTRAVENOUS

## 2020-04-27 MED ORDER — POLYETHYLENE GLYCOL 3350 17 G PO PACK: 17.0000 g | PACK | Freq: Every day | ORAL | Status: DC | PRN

## 2020-04-27 MED ORDER — LABETALOL HCL 5 MG/ML IV SOLN
10.0000 mg | INTRAVENOUS | Status: DC | PRN
  Administered 2020-04-27 – 2020-04-28 (×2): 10 mg via INTRAVENOUS
  Filled 2020-04-27 (×2): qty 4

## 2020-04-27 MED ORDER — INSULIN ASPART 100 UNIT/ML ~~LOC~~ SOLN
0.0000 [IU] | SUBCUTANEOUS | Status: DC
  Administered 2020-04-27 – 2020-04-28 (×3): 3 [IU] via SUBCUTANEOUS
  Administered 2020-04-28: 1 [IU] via SUBCUTANEOUS
  Administered 2020-04-28 (×2): 5 [IU] via SUBCUTANEOUS
  Administered 2020-04-28: 1 [IU] via SUBCUTANEOUS
  Administered 2020-04-28 – 2020-04-29 (×3): 2 [IU] via SUBCUTANEOUS
  Administered 2020-04-29: 3 [IU] via SUBCUTANEOUS
  Administered 2020-04-30 (×2): 1 [IU] via SUBCUTANEOUS

## 2020-04-27 MED ORDER — SODIUM CHLORIDE 0.9% FLUSH
3.0000 mL | Freq: Two times a day (BID) | INTRAVENOUS | Status: DC
  Administered 2020-04-28 – 2020-04-29 (×4): 3 mL via INTRAVENOUS

## 2020-04-27 MED ORDER — ONDANSETRON HCL 4 MG/2ML IJ SOLN
4.0000 mg | Freq: Once | INTRAMUSCULAR | Status: AC
  Administered 2020-04-27: 4 mg via INTRAVENOUS
  Filled 2020-04-27: qty 2

## 2020-04-27 MED ORDER — INSULIN GLARGINE 100 UNIT/ML ~~LOC~~ SOLN
14.0000 [IU] | Freq: Every day | SUBCUTANEOUS | Status: DC
  Administered 2020-04-28 – 2020-04-30 (×3): 14 [IU] via SUBCUTANEOUS
  Filled 2020-04-27 (×3): qty 0.14

## 2020-04-27 MED ORDER — SODIUM CHLORIDE 0.9 % IV SOLN
1.0000 g | Freq: Once | INTRAVENOUS | Status: AC
  Administered 2020-04-27: 1 g via INTRAVENOUS
  Filled 2020-04-27: qty 10

## 2020-04-27 MED ORDER — MORPHINE SULFATE (PF) 2 MG/ML IV SOLN
1.0000 mg | Freq: Three times a day (TID) | INTRAVENOUS | Status: DC | PRN
  Administered 2020-04-27 – 2020-04-30 (×8): 1 mg via INTRAVENOUS
  Filled 2020-04-27 (×8): qty 1

## 2020-04-27 MED ORDER — ALBUTEROL SULFATE (2.5 MG/3ML) 0.083% IN NEBU: 2.5000 mg | INHALATION_SOLUTION | RESPIRATORY_TRACT | Status: DC | PRN

## 2020-04-27 MED ORDER — ENOXAPARIN SODIUM 40 MG/0.4ML ~~LOC~~ SOLN
40.0000 mg | SUBCUTANEOUS | Status: DC
  Filled 2020-04-27 (×3): qty 0.4

## 2020-04-27 MED ORDER — FAMOTIDINE IN NACL 20-0.9 MG/50ML-% IV SOLN
20.0000 mg | INTRAVENOUS | Status: DC
  Administered 2020-04-27 – 2020-04-29 (×3): 20 mg via INTRAVENOUS
  Filled 2020-04-27 (×4): qty 50

## 2020-04-27 MED ORDER — ONDANSETRON 4 MG PO TBDP
4.0000 mg | ORAL_TABLET | Freq: Once | ORAL | Status: AC
  Administered 2020-04-27: 4 mg via ORAL
  Filled 2020-04-27: qty 1

## 2020-04-27 MED ORDER — SODIUM CHLORIDE 0.9 % IV SOLN
12.5000 mg | Freq: Three times a day (TID) | INTRAVENOUS | Status: DC | PRN
  Administered 2020-04-27: 12.5 mg via INTRAVENOUS
  Filled 2020-04-27 (×3): qty 0.5

## 2020-04-27 MED ORDER — ACETAMINOPHEN 10 MG/ML IV SOLN
1000.0000 mg | Freq: Once | INTRAVENOUS | Status: AC
  Administered 2020-04-27: 1000 mg via INTRAVENOUS
  Filled 2020-04-27: qty 100

## 2020-04-27 MED ORDER — INSULIN ASPART 100 UNIT/ML ~~LOC~~ SOLN: 0.0000 [IU] | SUBCUTANEOUS | Status: DC

## 2020-04-27 NOTE — Progress Notes (Signed)
Inpatient Diabetes Program Recommendations  AACE/ADA: New Consensus Statement on Inpatient Glycemic Control (2015)  Target Ranges:  Prepandial:   less than 140 mg/dL      Peak postprandial:   less than 180 mg/dL (1-2 hours)      Critically ill patients:  140 - 180 mg/dL   Lab Results  Component Value Date   GLUCAP 325 (H) 04/27/2020   HGBA1C 8.2 (H) 01/13/2020    Review of Glycemic Control  Diabetes history: type 1 Outpatient Diabetes medications: Omniopod insulin pump and Dexcom CGM Current orders for Inpatient glycemic control: Novolog 0-9 units correction scale every 4 hours  Inpatient Diabetes Program Recommendations:   Spoke with patient at the bedside. Patient states that her Omnipod insulin pump fell off on her way to the hospital.  Dexcom CGM is still inserted in her left upper arm.  Patient states that her basal rates and settings have not changed since last admission in January, 2022:   Omnipod + dexcom CGM:  Current Omnipod pump settings:  Basal rate: 0.55 Total units: 13.2  ISF: 1:30 (sensitivity 1 unit drops glucose 30 points) ICR: 1:10 (carbohydrate ratio 1 unit for every 10 grams of carbs) BG target: 125 Active insulin time: 2 hours  Spoke with Dr. Jodi Mourning about starting Lantus insulin. Recommend Lantus 14 units daily (to start now) and continue Novolog 0-9 units every 4 hours. Patient needs basal insulin due to Type 1 diabetes.  Will continue to monitor blood sugars while in the hospital.  Smith Mince RN BSN CDE Diabetes Coordinator Pager: [phone]  8am-5pm

## 2020-04-27 NOTE — ED Notes (Signed)
Pt started vomiting again. MD notified.

## 2020-04-27 NOTE — ED Notes (Signed)
MD aware of pt bp

## 2020-04-27 NOTE — ED Notes (Signed)
Lab called to add on hcg quantitative. Currently in process.

## 2020-04-27 NOTE — ED Provider Notes (Signed)
[facility] Provider Note   CSN: [id number] Arrival date & time: 04/27/20  1118     History Chief Complaint  Patient presents with  . Abdominal Pain    [name] Graber is a 35 y.o. female.  HPI   3 days of nausea and vomiting Patient reports that she has not been able to keep down any solids or liquids since that time Patient is a type I diabetic and states that her blood sugar has been very low because she has not been able to eat anything States that she has an insulin pump States that on her way here to the hospital she passed out in the car that was driven by her friend At that time, she thinks her insulin pump came out Prior to the last 3 days she has been feeling in her usual state of health Her blood sugar has been very well controlled She has a history of diabetic gastroparesis and is status post gastric stimulator States that she has not had an episode of vomiting like this in quite some time No sick contacts Has had one episode of diarrhea Also reports pain in her left lower back Denies any pain with urination or changes in urine Denies any fevers or chills Denies recent unprotected sex She is not on any form of contraception Marijuana 2 weeks ago Denies other drug use   Past Medical History:  Diagnosis Date  . Diabetes mellitus without complication (HCC)   . Gastroparesis   . Hypertension   . Marijuana abuse 01/13/2020  . Renal insufficiency affecting pregnancy     Patient Active Problem List   Diagnosis Date Noted  . DKA, type 1 (HCC) 02/23/2020  . Hypertensive emergency 02/23/2020  . Pyelonephritis 01/14/2020  . Cystitis 01/13/2020  . Type 1 diabetes mellitus with hyperglycemia, with long-term current use of insulin (HCC) 01/13/2020  . Diabetic gastroparesis (HCC) 01/13/2020  . Essential hypertension 01/13/2020  . Marijuana abuse 01/13/2020    Past Surgical History:  Procedure Laterality Date  . CESAREAN  SECTION    . GASTRIC STIMULATOR IMPLANT SURGERY  2016     OB History    Gravida  2   Para  1   Term      Preterm  1   AB      Living  1     SAB      IAB      Ectopic      Multiple      Live Births  1           History reviewed. No pertinent family history.  Social History   Tobacco Use  . Smoking status: Never Smoker  . Smokeless tobacco: Never Used  Substance Use Topics  . Alcohol use: Not Currently  . Drug use: Yes    Types: Marijuana    Comment: intermittent use    Home Medications Prior to Admission medications   Medication Sig Start Date End Date Taking? Authorizing Provider  acetaminophen (TYLENOL) 325 MG tablet Take 2 tablets (650 mg total) by mouth every 6 (six) hours as needed for mild pain (or Fever >/= 101). 01/16/20  Yes Vann, Jessica U, DO  insulin aspart (NOVOLOG) 100 UNIT/ML injection Inject 50 Units into the skin See admin instructions. Maximum of 50 units/day, per insulin pump 06/02/18  Yes [provider]  Insulin Human (INSULIN PUMP) SOLN Inject into the skin continuous.   Yes [provider]  labetalol (NORMODYNE) 300  MG tablet Take 600 mg by mouth 3 (three) times daily.   Yes [provider]    Allergies    Penicillins, Methocarbamol, and Azithromycin  Review of Systems   Review of Systems  Constitutional: Negative for chills, fatigue and fever.  HENT: Negative for congestion.   Respiratory: Negative for cough and shortness of breath.   Cardiovascular: Negative for chest pain and leg swelling.  Gastrointestinal: Positive for abdominal pain, diarrhea, nausea and vomiting. Negative for blood in stool and constipation.  Genitourinary: Negative for difficulty urinating, dysuria and hematuria.       Left sided low back pain  Neurological: Positive for dizziness and syncope.    Physical Exam Updated Vital Signs BP (!) 181/75 (BP Location: Right Arm)   Pulse (!) 105   Temp 99.1 F (37.3 C)   Resp 15    LMP 04/11/2020   SpO2 100%   Physical Exam Constitutional:      Appearance: She is ill-appearing.     Comments: Intermittently vomiting and writhing throughout encounter  HENT:     Head: Normocephalic and atraumatic.     Mouth/Throat:     Mouth: Mucous membranes are moist.  Cardiovascular:     Rate and Rhythm: Regular rhythm. Tachycardia present.     Heart sounds: No murmur heard. No friction rub. No gallop.   Pulmonary:     Effort: Pulmonary effort is normal.     Breath sounds: Normal breath sounds. No wheezing, rhonchi or rales.  Abdominal:     General: Abdomen is flat. Bowel sounds are normal.     Palpations: Abdomen is soft. There is no mass.     Tenderness: There is generalized abdominal tenderness.     Comments: Scars scattered on abdomen Left sided CVA tenderness  Skin:    General: Skin is warm and dry.     Findings: No rash.  Neurological:     General: No focal deficit present.     Mental Status: She is alert and oriented to person, place, and time.  Psychiatric:        Mood and Affect: Mood normal.        Behavior: Behavior normal.     ED Results / Procedures / Treatments   Labs (all labs ordered are listed, but only abnormal results are displayed) Labs Reviewed  COMPREHENSIVE METABOLIC PANEL - Abnormal; Notable for the following components:      Result Value   Sodium 133 (*)    CO2 18 (*)    Glucose, Bld 252 (*)    Creatinine, Ser 1.24 (*)    GFR, Estimated 59 (*)    All other components within normal limits  CBC WITH DIFFERENTIAL/PLATELET - Abnormal; Notable for the following components:   WBC 11.9 (*)    Hemoglobin 10.6 (*)    HCT 32.9 (*)    Neutro Abs 8.2 (*)    All other components within normal limits  URINALYSIS, ROUTINE W REFLEX MICROSCOPIC - Abnormal; Notable for the following components:   Glucose, UA >=500 (*)    Ketones, ur 80 (*)    Protein, ur 100 (*)    Nitrite POSITIVE (*)    Bacteria, UA RARE (*)    All other components within  normal limits  HCG, QUANTITATIVE, PREGNANCY - Abnormal; Notable for the following components:   hCG, Beta Chain, Quant, S 651 (*)    All other components within normal limits  I-STAT BETA HCG BLOOD, ED ([facility], [facility], [facility] ONLY) - Abnormal;  Notable for the following components:   I-stat hCG, quantitative 575.3 (*)    All other components within normal limits  CBG MONITORING, ED - Abnormal; Notable for the following components:   Glucose-Capillary 258 (*)    All other components within normal limits  I-STAT BETA HCG BLOOD, ED ([facility], [facility], [facility] ONLY) - Abnormal; Notable for the following components:   I-stat hCG, quantitative 516.9 (*)    All other components within normal limits  CBG MONITORING, ED - Abnormal; Notable for the following components:   Glucose-Capillary 325 (*)    All other components within normal limits    EKG EKG Interpretation  Date/Time:  Friday April 27 2020 11:21:06 EDT Ventricular Rate:  103 PR Interval:  118 QRS Duration: 72 QT Interval:  360 QTC Calculation: 471 R Axis:   80 Text Interpretation: Sinus tachycardia Left ventricular hypertrophy with repolarization abnormality ( Sokolow-Lyon ) Abnormal ECG Confirmed by Blane Ohara [phone]) on 04/27/2020 12:34:49 PM   Radiology No results found.  Procedures Procedures   Medications Ordered in ED Medications  lactated ringers infusion ( Intravenous New Bag/Given 04/27/20 1231)  insulin aspart (novoLOG) injection 0-9 Units (3 Units Subcutaneous Given 04/27/20 1354)  insulin glargine (LANTUS) injection 14 Units (has no administration in time range)  lactated ringers bolus 1,000 mL (has no administration in time range)  ondansetron (ZOFRAN-ODT) disintegrating tablet 4 mg (4 mg Oral Given 04/27/20 1130)  ondansetron (ZOFRAN) injection 4 mg (4 mg Intravenous Given 04/27/20 1231)  acetaminophen (OFIRMEV) IV 1,000 mg (0 mg Intravenous Stopped 04/27/20 1423)  ondansetron (ZOFRAN) injection 4 mg (4 mg Intravenous Given 04/27/20 1423)     ED Course  I have reviewed the triage vital signs and the nursing notes.  Pertinent labs & imaging results that were available during my care of the patient were reviewed by me and considered in my medical decision making (see chart for details).    MDM Rules/Calculators/A&P                          Patient is a 35 y.o. female with history of Type 1 DM and recurrent admissions for intractable vomiting who presents with 3 days of N/V and inability to tolerate PO, also with syncope today on way to ED and loss of insulin pump in transit.  Initial vitals tachycardic with hypertension.  Patient afebrile.  Actively vomiting.  Concern for DKA given T1DM and inability to tolerate PO.  Vomiting could be in setting of known diabetic gastroparesis, infectious etiology although less likely given history of no known sick contacts and only one episode of diarrhea, could also consider pregnancy as patient's i-STAT hCG elevated in the 500s.  We will confirm with quantitative hCG.  Given that she is having abdominal pain, will also order ultrasound to confirm location of pregnancy.  Serum hCG also positive.  Patient advised of pregnancy.  [place] pending.  No sign of DKA with labs, has hyperglycemia only.  Given 14u Lantus based on prior basal dose in hospital with insulin pump per diabetes coordinator.  Also placed on sliding scale q4h.  Giving LR @ 100cc/hr, will also ensure bolus is given.  Patient has continued to have vomiting and will likely require admission for inability to tolerate PO.  UA with concern for infection with positive nitrites and rare bacteria, concern for pyelonephritis in the setting of left CVA tenderness.  Has allergy to PCN but has had cephalosporins during hospitalization previously, therefore will  order CTX 1g x1.  Has been difficult to obtain accurate BP due to patient writhing in bed, but neurologically intact.  [place] with early intrauterine pregnancy.  No ectopic visualized.  Patient  continues to not tolerate PO.  Will need to admit for intractable nausea and vomiting in the setting of suspected pyelonephritis and Type 1 diabetes.  Paged OB/GYN to admit, spoke with Dr. Brayton El who states that given pregnancy is so early, recommend medicine admit and can re-consult OB if needed, but to proceed as patient is not pregnant.  Will page for unassigned admission.  Patient continues to complain of nausea and vomiting, continues to request IV benadryl as "this is the only thing that helps."  Given one time dose of benadryl 12.5mg , okay in pregnancy, category B.    [facility] agrees to admit.   Final Clinical Impression(s) / ED Diagnoses Final diagnoses:  Positive pregnancy test  Abdominal pain    Rx / DC Orders ED Discharge Orders    None       Unknown Jim, DO 04/27/20 1710    Blane Ohara, MD 04/28/20 1018

## 2020-04-27 NOTE — ED Notes (Signed)
Admitting MD at bedside at this time. Aware of pt current bp of 207/82. Alert and oriented x 4. Continuous cardiac monitoring in place.

## 2020-04-27 NOTE — ED Notes (Signed)
Wrong pt

## 2020-04-27 NOTE — ED Provider Notes (Signed)
MSE was initiated and I personally evaluated the patient and placed orders (if any) at  11:24 AM on April 27, 2020.  The patient appears stable so that the remainder of the MSE may be completed by another provider.   Patient placed in Quick Look pathway, seen and evaluated   Chief Complaint: nausea/vomiting, lightheadedness, syncope   HPI:  35 y.o. F with PMH/o DM 1 who presents for evaluation of nausea/vomiting that has been ongoing for the last 3 days. She reports she has also had episodes of lightheadedness/syncopal episodes. Last one was prior to triage. She has an insulin pump but it fell off her arm. She thinks her last dose of insulin was this morning. She reports associated upper abdominal pain. She had an episode of diarrhea initially but none since.   ROS: abdominal pain, nausea/vomiting, syncope   Physical Exam:   Gen: Appears uncomfortable   Neuro: Awake and Alert  Skin: Warm  Abd: abd is soft, non-distended. Mild tenderness noted to the upper abdomen.    11:25 AM: Community education officer that patient should be placed back in the main department as soon as possible.   Initiation of care has begun. The patient has been counseled on the process, plan, and necessity for staying for the completion/evaluation, and the remainder of the medical screening examination    Rosana Hoes 04/27/20 1128    Terald Sleeper, MD 04/27/20 1235

## 2020-04-27 NOTE — ED Notes (Signed)
Report given to rn [email or web address] 

## 2020-04-27 NOTE — ED Notes (Signed)
Pt is asking for pain meds, provider notified. Waiting for new orders.

## 2020-04-27 NOTE — ED Provider Notes (Signed)
ATTENDING SUPERVISORY NOTE I have personally viewed the imaging studies performed. I have personally seen and examined the patient, and discussed the plan of care with the resident.  I have reviewed the documentation of the resident and agree.  No diagnosis found.  Ultrasound ED Peripheral IV (Provider)  Date/Time: 04/27/2020 2:15 PM Performed by: Blane Ohara, MD Authorized by: Blane Ohara, MD   Procedure details:    Indications: multiple failed IV attempts     Skin Prep: chlorhexidine gluconate     Location:  Right AC   Angiocath:  20 G   Bedside Ultrasound Guided: Yes     Images: archived     Patient tolerated procedure without complications: Yes     Dressing applied: Yes   Ultrasound ED Peripheral IV (Provider)  Date/Time: 04/27/2020 2:16 PM Performed by: Blane Ohara, MD Authorized by: Blane Ohara, MD   Procedure details:    Indications: hydration and multiple failed IV attempts     Skin Prep: chlorhexidine gluconate     Location:  Right forearm   Angiocath:  20 G   Bedside Ultrasound Guided: Yes     Images: archived     Patient tolerated procedure without complications: Yes     Dressing applied: Yes        Blane Ohara, MD 04/28/20 1017

## 2020-04-27 NOTE — ED Notes (Signed)
Pt moving around and rocking while on bed while bp is taking. Pt redirected multiple times. Pt won't stop moving arm for bp. Reports 10/10 pain scale. MD notified.

## 2020-04-27 NOTE — ED Triage Notes (Signed)
Pt BIB POV due to abd pain , cp , sob. Pt reported she is unable to keep anything down.Pt is type 1 diabetic

## 2020-04-27 NOTE — H&P (Signed)
History and Physical  Dreana Britz VZC:[id number] DOB: 09-Jun-1985 DOA: 04/27/2020   Patient coming from: Home & is able to ambulate  Chief Complaint: Nausea/vomiting, abdominal pain  HPI: Tamara Lane is a 34 y.o. female with medical history significant for hypertension, type 1 diabetes mellitus on insulin pump, gastroparesis, CKD stage IIIa presents to the ED complaining of 3 days of intractable nausea and vomiting, unable to keep any solids or liquids down, also complained of generalized abdominal pain, 8/10, worse around the epigastric region ongoing for about 3 days as well.  Patient usually uses an insulin pump but reports fell out on her way coming here.  Patient has a history of gastroparesis and is status post gastric stimulator.  Patient also reports some dysuria that has been going on for the past 2 weeks intermittently, but did not seek any medical attention.  Patient denies any fever/chills, any sick contacts, denies any chest pain, shortness of breath.  Of note, patient reports using marijuana on a regular, but has not used it in 2 weeks, denies any other drug use.   ED Course:  In the ED, patient noted to have hypertensive crisis with SBP in the 200s, tachycardic, mild leukocytosis, hyponatremia.  Patient was noted to have an elevated beta-hCG, OB ultrasound done confirmed probable early intrauterine gestational sac towards the uterine fundus, but no yolk sac, fetal pole or cardiac activity yet visualized.  Of note, patient was not aware of the pregnancy, reports that she recently had a stillbirth in 03/2019.  Patient follows up with [facility] OB/gyn. Discussed with OB GYN on-call Dr. Debroah Loop, who recommended general management for patient and can be consulted as needed.  Recommended against any imaging due to radiation exposure at this time.    Review of Systems: Review of systems are otherwise negative   Past Medical History:  Diagnosis Date  . Diabetes mellitus without  complication (HCC)   . Gastroparesis   . Hypertension   . Marijuana abuse 01/13/2020  . Renal insufficiency affecting pregnancy    Past Surgical History:  Procedure Laterality Date  . CESAREAN SECTION    . GASTRIC STIMULATOR IMPLANT SURGERY  2016    Social History:  reports that she has never smoked. She has never used smokeless tobacco. She reports previous alcohol use. She reports current drug use. Drug: Marijuana.   Allergies  Allergen Reactions  . Penicillins Hives    Did it involve swelling of the face/tongue/throat, SOB, or low BP? N Did it involve sudden or severe rash/hives, skin peeling, or any reaction on the inside of your mouth or nose? Y Did you need to seek medical attention at a hospital or doctor's office? Unknown When did it last happen?childhood If all above answers are "NO", may proceed with cephalosporin use.   . Methocarbamol Nausea And Vomiting and Other (See Comments)    Body shaking, also  . Azithromycin Hives    History reviewed. No pertinent family history.   Prior to Admission medications   Medication Sig Start Date End Date Taking? Authorizing Provider  acetaminophen (TYLENOL) 325 MG tablet Take 2 tablets (650 mg total) by mouth every 6 (six) hours as needed for mild pain (or Fever >/= 101). 01/16/20  Yes Vann, Jessica U, DO  insulin aspart (NOVOLOG) 100 UNIT/ML injection Inject 50 Units into the skin See admin instructions. Maximum of 50 units/day, per insulin pump 06/02/18  Yes [provider]  Insulin Human (INSULIN PUMP) SOLN Inject into the skin continuous.  Yes [provider]  labetalol (NORMODYNE) 300 MG tablet Take 600 mg by mouth 3 (three) times daily.   Yes [provider]    Physical Exam: BP (!) 214/97 (BP Location: Left Arm)   Pulse (!) 106   Temp 98 F (36.7 C) (Oral)   Resp 15   LMP 04/11/2020   SpO2 100%   . General: In mild distress due to pain . Eyes: Normal . ENT: Normal . Neck:  Supple . Cardiovascular: S1, S2 present . Respiratory: CTA B . Abdomen: Soft, generalized tenderness especially at the epigastric region, nondistended, bowel sounds present . Skin: Normal . Musculoskeletal: No pedal edema noted bilaterally . Psychiatric: Normal mood . Neurologic: Strength equal in all extremities, no focal neurologic deficits noted          Labs on Admission:  Basic Metabolic Panel: Recent Labs  Lab 04/27/20 1142  NA 133*  K 3.8  CL 103  CO2 18*  GLUCOSE 252*  BUN 10  CREATININE 1.24*  CALCIUM 9.0   Liver Function Tests: Recent Labs  Lab 04/27/20 1142  AST 18  ALT 10  ALKPHOS 78  BILITOT 0.9  PROT 7.0  ALBUMIN 3.9   No results for input(s): LIPASE, AMYLASE in the last 168 hours. No results for input(s): AMMONIA in the last 168 hours. CBC: Recent Labs  Lab 04/27/20 1142  WBC 11.9*  NEUTROABS 8.2*  HGB 10.6*  HCT 32.9*  MCV 80.6  PLT 280   Cardiac Enzymes: No results for input(s): CKTOTAL, CKMB, CKMBINDEX, TROPONINI in the last 168 hours.  BNP (last 3 results) No results for input(s): BNP in the last 8760 hours.  ProBNP (last 3 results) No results for input(s): PROBNP in the last 8760 hours.  CBG: Recent Labs  Lab 04/27/20 1123 04/27/20 1351 04/27/20 1522  GLUCAP 258* 325* 240*    Radiological Exams on Admission: US OB LESS THAN 14 WEEKS WITH OB TRANSVAGINAL  Result Date: 04/27/2020 CLINICAL DATA:  Abdominal pain for 3 days with emesis, quantitative hCG = 651, unknown LMP EXAM: OBSTETRIC <14 WK [place] AND TRANSVAGINAL OB US TECHNIQUE: Both transabdominal and transvaginal ultrasound examinations were performed for complete evaluation of the gestation as well as the maternal uterus, adnexal regions, and pelvic cul-de-sac. Transvaginal technique was performed to assess early pregnancy. COMPARISON:  CT 01/13/2020 FINDINGS: Intrauterine gestational sac: Possible single intrauterine gestational sac towards the uterine fundus (36, 37/86) Yolk  sac:  Not Visualized. Embryo:  Not Visualized. Cardiac Activity: Not Visualized. MSD: 2.9 mm   5 w   0 d Subchorionic hemorrhage:  None visualized. Maternal uterus/adnexae: Anteverted maternal uterus with likely decidual reaction of the endometrium. Normal appearance of the right ovary. A slightly complex cystic focus is seen in the left ovary with peripheral color Doppler flow most compatible with a corpus luteum measuring up to 1.9 x 0.9 x 1.2 cm. Small volume anechoic free fluid is seen in the pelvis, nonspecific. IMPRESSION: Probable early intrauterine gestational sac towards the uterine fundus, but no yolk sac, fetal pole, or cardiac activity yet visualized. Recommend follow-up quantitative B-HCG levels and follow-up US in 14 days to assess viability. This recommendation follows SRU consensus guidelines: Diagnostic Criteria for Nonviable Pregnancy Early in the First Trimester. Malva Limes Med 2013; 702:6378-58. Small volume anechoic free fluid in the pelvis, nonspecific but can be a physiologic finding. Electronically Signed   By: Kreg Shropshire M.D.   On: 04/27/2020 15:26    EKG: Independently reviewed.  Sinus  tachycardia  Assessment/Plan Present on Admission: . Acute lower UTI . Diabetic gastroparesis (HCC) . Essential hypertension . Type 1 diabetes mellitus with hyperglycemia, with long-term current use of insulin (HCC) . Marijuana abuse  Principal Problem:   Acute lower UTI Active Problems:   Type 1 diabetes mellitus with hyperglycemia, with long-term current use of insulin (HCC)   Diabetic gastroparesis (HCC)   Essential hypertension   Marijuana abuse   UTI Afebrile, with mild leukocytosis UA showed positive nitrites, negative leukocytes, rare bacteria, glucose greater than 500 UC pending collection Continue IV ceftriaxone  Intractable nausea/vomiting History of gastroparesis Metabolic acidosis Clear liquid diet for now, advance as tolerated As needed Phenergan, morphine (okay to  use by OB/GYN) IV fluids  Hyponatremia Likely 2/2 above Continue IV fluids  Type 1 diabetes mellitus, uncontrolled Last A1c 8.2 on 01/13/20, repeat pending Diabetes coordinator on board, patient lost insulin pump Continue SSI, Lantus, CBGs every 4 hours, hypoglycemic protocol Monitor BMP closely, at risk for DKA  Hypertensive crisis Likely worsened due to pain and intractable nausea/vomiting IV labetalol as needed for now, once able to tolerate start p.o. labetalol Telemetry  Incidental early pregnancy Elevated hCG OB ultrasound showed confirmed probable early intrauterine gestational sac towards the uterine fundus, but no yolk sac, fetal pole or cardiac activity yet visualized History of stillbirth in 03/2019 Patient follows up with [facility] OB[facility], outpt follow up Discussed with OB GYN on-call Dr. Debroah Loop, who recommended general adult management for patient and can be consulted as needed.  Recommended against any imaging due to radiation exposure at this time.  CKD stage IIIa Baseline creatinine around 1.2-1.3 Daily BMP  Anemia of chronic kidney disease Hemoglobin at baseline Daily CBC  Marijuana abuse UDS pending Patient advised to quit        DVT prophylaxis: Lovenox  Code Status: Full  Family Communication: Discussed extensively with patient  Disposition Plan: Likely home  Consults called: Discussed with on-call OB/GYN Dr. Debroah Loop  Admission status: Observation      Briant Cedar MD [facility]   If 7PM-7AM, please contact night-coverage [email or web address]  04/27/2020, 7:02 PM

## 2020-04-28 DIAGNOSIS — O24011 Pre-existing diabetes mellitus, type 1, in pregnancy, first trimester: Secondary | ICD-10-CM | POA: Diagnosis present

## 2020-04-28 DIAGNOSIS — N390 Urinary tract infection, site not specified: Secondary | ICD-10-CM | POA: Diagnosis present

## 2020-04-28 DIAGNOSIS — O99281 Endocrine, nutritional and metabolic diseases complicating pregnancy, first trimester: Secondary | ICD-10-CM | POA: Diagnosis present

## 2020-04-28 DIAGNOSIS — O26831 Pregnancy related renal disease, first trimester: Secondary | ICD-10-CM | POA: Diagnosis present

## 2020-04-28 DIAGNOSIS — Z3A01 Less than 8 weeks gestation of pregnancy: Secondary | ICD-10-CM | POA: Diagnosis not present

## 2020-04-28 DIAGNOSIS — I169 Hypertensive crisis, unspecified: Secondary | ICD-10-CM | POA: Diagnosis present

## 2020-04-28 DIAGNOSIS — E1143 Type 2 diabetes mellitus with diabetic autonomic (poly)neuropathy: Secondary | ICD-10-CM | POA: Diagnosis not present

## 2020-04-28 DIAGNOSIS — I10 Essential (primary) hypertension: Secondary | ICD-10-CM | POA: Diagnosis not present

## 2020-04-28 DIAGNOSIS — K3184 Gastroparesis: Secondary | ICD-10-CM | POA: Diagnosis present

## 2020-04-28 DIAGNOSIS — I129 Hypertensive chronic kidney disease with stage 1 through stage 4 chronic kidney disease, or unspecified chronic kidney disease: Secondary | ICD-10-CM | POA: Diagnosis present

## 2020-04-28 DIAGNOSIS — E1043 Type 1 diabetes mellitus with diabetic autonomic (poly)neuropathy: Secondary | ICD-10-CM | POA: Diagnosis present

## 2020-04-28 DIAGNOSIS — E1022 Type 1 diabetes mellitus with diabetic chronic kidney disease: Secondary | ICD-10-CM | POA: Diagnosis present

## 2020-04-28 DIAGNOSIS — O10011 Pre-existing essential hypertension complicating pregnancy, first trimester: Secondary | ICD-10-CM | POA: Diagnosis present

## 2020-04-28 DIAGNOSIS — E1065 Type 1 diabetes mellitus with hyperglycemia: Secondary | ICD-10-CM | POA: Diagnosis present

## 2020-04-28 DIAGNOSIS — E872 Acidosis: Secondary | ICD-10-CM | POA: Diagnosis present

## 2020-04-28 DIAGNOSIS — N1831 Chronic kidney disease, stage 3a: Secondary | ICD-10-CM | POA: Diagnosis present

## 2020-04-28 DIAGNOSIS — R112 Nausea with vomiting, unspecified: Secondary | ICD-10-CM | POA: Diagnosis present

## 2020-04-28 DIAGNOSIS — E871 Hypo-osmolality and hyponatremia: Secondary | ICD-10-CM | POA: Diagnosis present

## 2020-04-28 DIAGNOSIS — O2301 Infections of kidney in pregnancy, first trimester: Secondary | ICD-10-CM | POA: Diagnosis present

## 2020-04-28 DIAGNOSIS — E10649 Type 1 diabetes mellitus with hypoglycemia without coma: Secondary | ICD-10-CM | POA: Diagnosis not present

## 2020-04-28 DIAGNOSIS — Z20822 Contact with and (suspected) exposure to covid-19: Secondary | ICD-10-CM | POA: Diagnosis present

## 2020-04-28 DIAGNOSIS — O99321 Drug use complicating pregnancy, first trimester: Secondary | ICD-10-CM | POA: Diagnosis present

## 2020-04-28 DIAGNOSIS — Z794 Long term (current) use of insulin: Secondary | ICD-10-CM | POA: Diagnosis not present

## 2020-04-28 DIAGNOSIS — Z9641 Presence of insulin pump (external) (internal): Secondary | ICD-10-CM | POA: Diagnosis present

## 2020-04-28 DIAGNOSIS — F1210 Cannabis abuse, uncomplicated: Secondary | ICD-10-CM | POA: Diagnosis present

## 2020-04-28 DIAGNOSIS — E876 Hypokalemia: Secondary | ICD-10-CM | POA: Diagnosis present

## 2020-04-28 LAB — CBC
HCT: 35.7 % — ABNORMAL LOW (ref 36.0–46.0)
Hemoglobin: 11.1 g/dL — ABNORMAL LOW (ref 12.0–15.0)
MCH: 25.3 pg — ABNORMAL LOW (ref 26.0–34.0)
MCHC: 31.1 g/dL (ref 30.0–36.0)
MCV: 81.3 fL (ref 80.0–100.0)
Platelets: 288 10*3/uL (ref 150–400)
RBC: 4.39 MIL/uL (ref 3.87–5.11)
RDW: 15.5 % (ref 11.5–15.5)
WBC: 17.4 10*3/uL — ABNORMAL HIGH (ref 4.0–10.5)
nRBC: 0 % (ref 0.0–0.2)

## 2020-04-28 LAB — BASIC METABOLIC PANEL
Anion gap: 12 (ref 5–15)
BUN: 14 mg/dL (ref 6–20)
CO2: 18 mmol/L — ABNORMAL LOW (ref 22–32)
Calcium: 9.1 mg/dL (ref 8.9–10.3)
Chloride: 101 mmol/L (ref 98–111)
Creatinine, Ser: 1.25 mg/dL — ABNORMAL HIGH (ref 0.44–1.00)
GFR, Estimated: 58 mL/min — ABNORMAL LOW (ref >60)
Glucose, Bld: 238 mg/dL — ABNORMAL HIGH (ref 70–99)
Potassium: 3.7 mmol/L (ref 3.5–5.1)
Sodium: 131 mmol/L — ABNORMAL LOW (ref 135–145)

## 2020-04-28 LAB — GLUCOSE, CAPILLARY
Glucose-Capillary: 264 mg/dL — ABNORMAL HIGH (ref 70–99)
Glucose-Capillary: 197 mg/dL — ABNORMAL HIGH (ref 70–99)
Glucose-Capillary: 147 mg/dL — ABNORMAL HIGH (ref 70–99)
Glucose-Capillary: 242 mg/dL — ABNORMAL HIGH (ref 70–99)
Glucose-Capillary: 123 mg/dL — ABNORMAL HIGH (ref 70–99)

## 2020-04-28 MED ORDER — LABETALOL HCL 5 MG/ML IV SOLN: 20.0000 mg | INTRAVENOUS | Status: DC | PRN

## 2020-04-28 MED ORDER — LABETALOL HCL 5 MG/ML IV SOLN
10.0000 mg | Freq: Once | INTRAVENOUS | Status: DC
  Filled 2020-04-28: qty 4

## 2020-04-28 MED ORDER — MORPHINE SULFATE (PF) 2 MG/ML IV SOLN
2.0000 mg | Freq: Once | INTRAVENOUS | Status: AC
Start: 2020-04-28 — End: 2020-04-28
  Administered 2020-04-28: 2 mg via INTRAVENOUS
  Filled 2020-04-28: qty 1

## 2020-04-28 MED ORDER — LABETALOL HCL 5 MG/ML IV SOLN: 10.0000 mg | INTRAVENOUS | Status: DC | PRN

## 2020-04-28 MED ORDER — DIPHENHYDRAMINE HCL 25 MG PO CAPS
25.0000 mg | ORAL_CAPSULE | Freq: Once | ORAL | Status: AC
  Administered 2020-04-28: 25 mg via ORAL
  Filled 2020-04-28: qty 1

## 2020-04-28 MED ORDER — DIPHENHYDRAMINE HCL 50 MG/ML IJ SOLN
12.5000 mg | Freq: Once | INTRAMUSCULAR | Status: AC
  Administered 2020-04-28: 12.5 mg via INTRAVENOUS
  Filled 2020-04-28: qty 1

## 2020-04-28 NOTE — Progress Notes (Signed)
04/27/2020 2345 Received pt to room 4E-08 from ED.  Tele monitor applied and CCMD notified.  CHG bath given.  Pt is A&O-C/O back pain 9/10 and itching, will not stay still-making it difficult to get an accurate set of vitals.  Will notify MD.  Oriented to room, call light and bed.  Call bell in reach. Kathryne Hitch

## 2020-04-28 NOTE — Progress Notes (Signed)
[facility] night shift.  The patient requested Benadryl through the nursing staff.  Diphenhydramine 25 mg p.o. x1 dose ordered.  Sanda Klein, MD

## 2020-04-28 NOTE — Progress Notes (Signed)
PROGRESS NOTE  Tamara Lane KGM:[id number] DOB: 1985/02/11 DOA: 04/27/2020 PCP: Patient, No Pcp Per (Inactive)  HPI/Recap of past 24 hours: Tamara Lane is a 35 y.o. female with medical history significant for hypertension, type 1 diabetes mellitus on insulin pump, gastroparesis, CKD stage IIIa presents to the ED complaining of 3 days of intractable nausea and vomiting, unable to keep any solids or liquids down, also complained of generalized abdominal pain, 8/10, worse around the epigastric region ongoing for about 3 days as well.  Patient usually uses an insulin pump but reports fell out on her way coming here.  Patient has a history of gastroparesis and is status post gastric stimulator.  Patient also reports some dysuria that has been going on for the past 2 weeks intermittently, but did not seek any medical attention.  Patient denies any fever/chills, any sick contacts, denies any chest pain, shortness of breath.  Of note, patient reports using marijuana on a regular, but has not used it in 2 weeks, denies any other drug use. In the ED, patient noted to have hypertensive crisis with SBP in the 200s, tachycardic, mild leukocytosis, hyponatremia.  Patient was noted to have an elevated beta-hCG, OB ultrasound done confirmed probable early intrauterine gestational sac towards the uterine fundus, but no yolk sac, fetal pole or cardiac activity yet visualized.  Of note, patient was not aware of the pregnancy, reports that she recently had a stillbirth in 03/2019.  Patient follows up with [facility] OB/gyn. Discussed with OB GYN on-call Dr. Debroah Loop, who recommended general management for patient and can be consulted as needed.  Recommended against any imaging due to radiation exposure at this time.    Today, patient still complaining of abdominal pain, worse on the left side radiating to the epigastric region, still feels nausea, but denies any vomiting, denies any  fever/chills.    Assessment/Plan: Principal Problem:   Acute lower UTI Active Problems:   Type 1 diabetes mellitus with hyperglycemia, with long-term current use of insulin (HCC)   Diabetic gastroparesis (HCC)   Essential hypertension   Marijuana abuse   UTI Afebrile, with leukocytosis UA showed positive nitrites, negative leukocytes, rare bacteria, glucose greater than 500 UC pending Continue IV ceftriaxone  Intractable nausea/vomiting History of gastroparesis Metabolic acidosis Clear liquid diet for now, advance as tolerated As needed Phenergan, morphine (okay to use by OB/GYN) IV fluids  Hyponatremia Likely 2/2 above Continue IV fluids  Type 1 diabetes mellitus, uncontrolled Last A1c 9.1 Diabetes coordinator on board, patient lost insulin pump Continue SSI, Lantus, CBGs every 4 hours, hypoglycemic protocol Diabetes coordinator consulted Monitor BMP closely, at risk for DKA  Hypertensive crisis Likely worsened due to pain and intractable nausea/vomiting IV labetalol as needed for now, restart home p.o. labetalol Telemetry  Incidental early pregnancy Elevated hCG OB ultrasound showed confirmed probable early intrauterine gestational sac towards the uterine fundus, but no yolk sac, fetal pole or cardiac activity yet visualized History of stillbirth in 03/2019 Patient follows up with [facility] OB/g[facility], outpt follow up Discussed with OB GYN on-call Dr. Debroah Loop, who recommended general adult management for patient and can be consulted as needed.  Recommended against any imaging due to radiation exposure at this time.  CKD stage IIIa Baseline creatinine around 1.2-1.3 Daily BMP  Anemia of chronic kidney disease Hemoglobin at baseline Daily CBC  Marijuana abuse UDS positive for marijuana  Patient advised to quit    Estimated body mass index is 19.68 kg/m as calculated from the following:  Height as of this encounter: 5\' 6"  (1.676 m).   Weight as  of 03/11/20: 55.3 kg.     Code Status: Full  Family Communication: Discussed with patient  Disposition Plan: Status is: Observation  The patient will require care spanning > 2 midnights and should be moved to inpatient because: Inpatient level of care appropriate due to severity of illness  Dispo: The patient is from: Home              Anticipated d/c is to: Home              Patient currently is not medically stable to d/c.   Difficult to place patient No    Consultants:  None  Procedures:  None  Antimicrobials:  Ceftriaxone  DVT prophylaxis: Lovenox   Objective: Vitals:   04/28/20 0100 04/28/20 0500 04/28/20 0700 04/28/20 1207  BP: (!) 141/81 (!) 147/77 (!) 160/83 (!) 173/89  Pulse: 100 (!) 105 96 (!) 103  Resp: 14 16 16 16   Temp: 99.5 F (37.5 C) 99 F (37.2 C) 99.2 F (37.3 C) 98.7 F (37.1 C)  TempSrc: Oral Oral Oral Oral  SpO2: 100% 100% 99% 100%  Height:        Intake/Output Summary (Last 24 hours) at 04/28/2020 1427 Last data filed at 04/28/2020 0031 Gross per 24 hour  Intake 1103 ml  Output --  Net 1103 ml   There were no vitals filed for this visit.  Exam:  General: NAD   Cardiovascular: S1, S2 present  Respiratory: CTAB  Abdomen: Soft, +tender, nondistended, bowel sounds present  Musculoskeletal: No bilateral pedal edema noted  Skin: Normal  Psychiatry: Fair mood    Data Reviewed: CBC: Recent Labs  Lab 04/27/20 1142 04/28/20 0123  WBC 11.9* 17.4*  NEUTROABS 8.2*  --   HGB 10.6* 11.1*  HCT 32.9* 35.7*  MCV 80.6 81.3  PLT 280 288   Basic Metabolic Panel: Recent Labs  Lab 04/27/20 1142 04/27/20 1859 04/28/20 0123  NA 133* 133* 131*  K 3.8 3.7 3.7  CL 103 101 101  CO2 18* 22 18*  GLUCOSE 252* 106* 238*  BUN 10 11 14   CREATININE 1.24* 1.14* 1.25*  CALCIUM 9.0 9.3 9.1   GFR: CrCl cannot be calculated (Unknown ideal weight.). Liver Function Tests: Recent Labs  Lab 04/27/20 1142  AST 18  ALT 10  ALKPHOS 78   BILITOT 0.9  PROT 7.0  ALBUMIN 3.9   No results for input(s): LIPASE, AMYLASE in the last 168 hours. No results for input(s): AMMONIA in the last 168 hours. Coagulation Profile: No results for input(s): INR, PROTIME in the last 168 hours. Cardiac Enzymes: No results for input(s): CKTOTAL, CKMB, CKMBINDEX, TROPONINI in the last 168 hours. BNP (last 3 results) No results for input(s): PROBNP in the last 8760 hours. HbA1C: Recent Labs    04/27/20 1909  HGBA1C 9.1*   CBG: Recent Labs  Lab 04/27/20 2032 04/27/20 2348 04/28/20 0504 04/28/20 0756 04/28/20 1205  GLUCAP 101* 275* 264* 197* 147*   Lipid Profile: No results for input(s): CHOL, HDL, LDLCALC, TRIG, CHOLHDL, LDLDIRECT in the last 72 hours. Thyroid Function Tests: No results for input(s): TSH, T4TOTAL, FREET4, T3FREE, THYROIDAB in the last 72 hours. Anemia Panel: No results for input(s): VITAMINB12, FOLATE, FERRITIN, TIBC, IRON, RETICCTPCT in the last 72 hours. Urine analysis:    Component Value Date/Time   COLORURINE YELLOW 04/27/2020 1200   APPEARANCEUR CLEAR 04/27/2020 1200   LABSPEC 1.021 04/27/2020 1200  PHURINE 6.0 04/27/2020 1200   GLUCOSEU >=500 (A) 04/27/2020 1200   HGBUR NEGATIVE 04/27/2020 1200   BILIRUBINUR NEGATIVE 04/27/2020 1200   KETONESUR 80 (A) 04/27/2020 1200   PROTEINUR 100 (A) 04/27/2020 1200   NITRITE POSITIVE (A) 04/27/2020 1200   LEUKOCYTESUR NEGATIVE 04/27/2020 1200   Sepsis Labs: @LABRCNTIP (procalcitonin:4,lacticidven:4)  ) Recent Results (from the past 240 hour(s))  SARS CORONAVIRUS 2 (TAT 6-24 HRS) Nasopharyngeal Nasopharyngeal Swab     Status: None   Collection Time: 04/27/20  3:40 PM   Specimen: Nasopharyngeal Swab  Result Value Ref Range Status   SARS Coronavirus 2 NEGATIVE NEGATIVE Final    Comment: (NOTE) SARS-CoV-2 target nucleic acids are NOT DETECTED.  The SARS-CoV-2 RNA is generally detectable in upper and lower respiratory specimens during the acute phase of  infection. Negative results do not preclude SARS-CoV-2 infection, do not rule out co-infections with other pathogens, and should not be used as the sole basis for treatment or other patient management decisions. Negative results must be combined with clinical observations, patient history, and epidemiological information. The expected result is Negative.  Fact Sheet for Patients: 06/27/20  Fact Sheet for Healthcare Providers: HairSlick.no  This test is not yet approved or cleared by the [place] FDA and  has been authorized for detection and/or diagnosis of SARS-CoV-2 by FDA under an Emergency Use Authorization (EUA). This EUA will remain  in effect (meaning this test can be used) for the duration of the COVID-19 declaration under Se ction 564(b)(1) of the Act, 21 U.S.C. section 360bbb-3(b)(1), unless the authorization is terminated or revoked sooner.  Performed at [facility], [address] [place], [place]       Studies: [address] OB LESS THAN 14 WEEKS WITH OB TRANSVAGINAL  Result Date: 04/27/2020 CLINICAL DATA:  Abdominal pain for 3 days with emesis, quantitative hCG = 651, unknown LMP EXAM: OBSTETRIC <14 WK 06/27/2020 AND TRANSVAGINAL OB US TECHNIQUE: Both transabdominal and transvaginal ultrasound examinations were performed for complete evaluation of the gestation as well as the maternal uterus, adnexal regions, and pelvic cul-de-sac. Transvaginal technique was performed to assess early pregnancy. COMPARISON:  CT 01/13/2020 FINDINGS: Intrauterine gestational sac: Possible single intrauterine gestational sac towards the uterine fundus (36, 37/86) Yolk sac:  Not Visualized. Embryo:  Not Visualized. Cardiac Activity: Not Visualized. MSD: 2.9 mm   5 w   0 d Subchorionic hemorrhage:  None visualized. Maternal uterus/adnexae: Anteverted maternal uterus with likely decidual reaction of the endometrium. Normal  appearance of the right ovary. A slightly complex cystic focus is seen in the left ovary with peripheral color Doppler flow most compatible with a corpus luteum measuring up to 1.9 x 0.9 x 1.2 cm. Small volume anechoic free fluid is seen in the pelvis, nonspecific. IMPRESSION: Probable early intrauterine gestational sac towards the uterine fundus, but no yolk sac, fetal pole, or cardiac activity yet visualized. Recommend follow-up quantitative B-HCG levels and follow-up 06-02-1991 in 14 days to assess viability. This recommendation follows SRU consensus guidelines: Diagnostic Criteria for Nonviable Pregnancy Early in the First Trimester. [place] Med 20132014. Small volume anechoic free fluid in the pelvis, nonspecific but can be a physiologic finding. Electronically Signed   By: ; [id number] M.D.   On: 04/27/2020 15:26    Scheduled Meds: . enoxaparin (LOVENOX) injection  40 mg Subcutaneous Q24H  . insulin aspart  0-9 Units Subcutaneous Q4H  . insulin glargine  14 Units Subcutaneous Daily  . labetalol  600 mg Oral  TID  . sodium chloride flush  3 mL Intravenous Q12H    Continuous Infusions: . cefTRIAXone (ROCEPHIN)  IV    . famotidine (PEPCID) IV Stopped (04/27/20 2116)  . lactated ringers 100 mL/hr at 04/28/20 0912  . promethazine (PHENERGAN) injection Stopped (04/27/20 2309)     LOS: 0 days     Briant Cedar, MD [facility]  If 7PM-7AM, please contact night-coverage [email or web address] 04/28/2020, 2:27 PM

## 2020-04-29 ENCOUNTER — Encounter (HOSPITAL_COMMUNITY): Payer: Self-pay | Admitting: Internal Medicine

## 2020-04-29 DIAGNOSIS — F1210 Cannabis abuse, uncomplicated: Secondary | ICD-10-CM | POA: Diagnosis not present

## 2020-04-29 DIAGNOSIS — N390 Urinary tract infection, site not specified: Secondary | ICD-10-CM | POA: Diagnosis not present

## 2020-04-29 DIAGNOSIS — I10 Essential (primary) hypertension: Secondary | ICD-10-CM | POA: Diagnosis not present

## 2020-04-29 DIAGNOSIS — E1143 Type 2 diabetes mellitus with diabetic autonomic (poly)neuropathy: Secondary | ICD-10-CM | POA: Diagnosis not present

## 2020-04-29 LAB — GLUCOSE, CAPILLARY
Glucose-Capillary: 113 mg/dL — ABNORMAL HIGH (ref 70–99)
Glucose-Capillary: 131 mg/dL — ABNORMAL HIGH (ref 70–99)
Glucose-Capillary: 151 mg/dL — ABNORMAL HIGH (ref 70–99)
Glucose-Capillary: 169 mg/dL — ABNORMAL HIGH (ref 70–99)
Glucose-Capillary: 212 mg/dL — ABNORMAL HIGH (ref 70–99)
Glucose-Capillary: 69 mg/dL — ABNORMAL LOW (ref 70–99)
Glucose-Capillary: 79 mg/dL (ref 70–99)

## 2020-04-29 LAB — URINE CULTURE: Culture: NO GROWTH

## 2020-04-29 LAB — CBC WITH DIFFERENTIAL/PLATELET
Abs Immature Granulocytes: 0.03 10*3/uL (ref 0.00–0.07)
Basophils Absolute: 0.1 10*3/uL (ref 0.0–0.1)
Basophils Relative: 0 %
Eosinophils Absolute: 0 10*3/uL (ref 0.0–0.5)
Eosinophils Relative: 0 %
HCT: 26.6 % — ABNORMAL LOW (ref 36.0–46.0)
Hemoglobin: 8.7 g/dL — ABNORMAL LOW (ref 12.0–15.0)
Immature Granulocytes: 0 %
Lymphocytes Relative: 30 %
Lymphs Abs: 3.6 10*3/uL (ref 0.7–4.0)
MCH: 25.8 pg — ABNORMAL LOW (ref 26.0–34.0)
MCHC: 32.7 g/dL (ref 30.0–36.0)
MCV: 78.9 fL — ABNORMAL LOW (ref 80.0–100.0)
Monocytes Absolute: 0.9 10*3/uL (ref 0.1–1.0)
Monocytes Relative: 7 %
Neutro Abs: 7.4 10*3/uL (ref 1.7–7.7)
Neutrophils Relative %: 63 %
Platelets: 241 10*3/uL (ref 150–400)
RBC: 3.37 MIL/uL — ABNORMAL LOW (ref 3.87–5.11)
RDW: 16 % — ABNORMAL HIGH (ref 11.5–15.5)
WBC: 12 10*3/uL — ABNORMAL HIGH (ref 4.0–10.5)
nRBC: 0 % (ref 0.0–0.2)

## 2020-04-29 LAB — BASIC METABOLIC PANEL
Anion gap: 6 (ref 5–15)
BUN: 13 mg/dL (ref 6–20)
CO2: 21 mmol/L — ABNORMAL LOW (ref 22–32)
Calcium: 8.3 mg/dL — ABNORMAL LOW (ref 8.9–10.3)
Chloride: 105 mmol/L (ref 98–111)
Creatinine, Ser: 1.21 mg/dL — ABNORMAL HIGH (ref 0.44–1.00)
GFR, Estimated: >60 mL/min (ref >60)
Glucose, Bld: 113 mg/dL — ABNORMAL HIGH (ref 70–99)
Potassium: 3.3 mmol/L — ABNORMAL LOW (ref 3.5–5.1)
Sodium: 132 mmol/L — ABNORMAL LOW (ref 135–145)

## 2020-04-29 LAB — MAGNESIUM: Magnesium: 1.6 mg/dL — ABNORMAL LOW (ref 1.7–2.4)

## 2020-04-29 MED ORDER — MAGNESIUM SULFATE 2 GM/50ML IV SOLN
2.0000 g | Freq: Once | INTRAVENOUS | Status: AC
  Administered 2020-04-29: 2 g via INTRAVENOUS
  Filled 2020-04-29: qty 50

## 2020-04-29 MED ORDER — FAMOTIDINE 20 MG PO TABS: 20.0000 mg | ORAL_TABLET | Freq: Two times a day (BID) | ORAL | 0 refills | Status: AC

## 2020-04-29 MED ORDER — HYDROCODONE-ACETAMINOPHEN 5-325 MG PO TABS
1.0000 | ORAL_TABLET | Freq: Four times a day (QID) | ORAL | Status: DC | PRN
Start: 2020-04-29 — End: 2020-04-30
  Administered 2020-04-29 (×3): 1 via ORAL
  Filled 2020-04-29 (×3): qty 1

## 2020-04-29 MED ORDER — POTASSIUM CHLORIDE CRYS ER 20 MEQ PO TBCR
40.0000 meq | EXTENDED_RELEASE_TABLET | Freq: Once | ORAL | Status: AC
  Administered 2020-04-29: 40 meq via ORAL
  Filled 2020-04-29: qty 2

## 2020-04-29 MED ORDER — HYDROCODONE-ACETAMINOPHEN 5-325 MG PO TABS: 1.0000 | ORAL_TABLET | Freq: Three times a day (TID) | ORAL | 0 refills | Status: AC | PRN

## 2020-04-29 MED ORDER — CEPHALEXIN 500 MG PO CAPS: 500.0000 mg | ORAL_CAPSULE | Freq: Two times a day (BID) | ORAL | 0 refills | Status: DC

## 2020-04-29 NOTE — Discharge Summary (Addendum)
Discharge Summary  Tamara KaufmannDuvale Lane ZOX:[id number] DOB: 10-25-1985  PCP: Patient, No Pcp Per (Inactive)  Admit date: 04/27/2020 Discharge date: 04/29/2020  Time spent: 40 mins  Recommendations for Outpatient Follow-up:  1. Follow-up with PCP in 1 week 2. Follow-up with OB/GYN  Discharge Diagnoses:  Active Hospital Problems   Diagnosis Date Noted  . Acute lower UTI 04/27/2020  . UTI (urinary tract infection) 04/28/2020  . Diabetic gastroparesis (HCC) 01/13/2020  . Essential hypertension 01/13/2020  . Type 1 diabetes mellitus with hyperglycemia, with long-term current use of insulin (HCC) 01/13/2020  . Marijuana abuse 01/13/2020    Resolved Hospital Problems  No resolved problems to display.    Discharge Condition: Stable  Diet recommendation: Regular diet as tolerated  Vitals:   04/29/20 0756 04/29/20 1100  BP: (!) 159/97   Pulse: 100   Resp: 20 19  Temp: 99 F (37.2 C)   SpO2: 100%     History of present illness:  Tamara Mooreis a 35 y.o.femalewith medical history significant forhypertension, type 1 diabetes mellitus on insulin pump,gastroparesis, CKD stage IIIapresents to the ED complaining of 3 days of intractable nausea and vomiting.Patient has a history of gastroparesis and is status post gastric stimulator which was removed in 2021, due to discomfort.Patient also reports some dysuria that has been going on for the past 2 weeks intermittently,but did not seek any medical attention. Of note, patient reports using marijuana on a regular, but has not used it in 2 weeks, denies any other drug use. In the ED, patient noted to have hypertensive crisis with SBP in the 200s, tachycardic,mild leukocytosis, hyponatremia.Patient was noted to have an elevated beta-hCG,OB ultrasound done confirmed probable early intrauterine gestational sac towards the uterine fundus, but no yolk sac, fetal pole or cardiac activity yet visualized.Of note, patient was not aware of the  pregnancy,reports that she recently had a stillbirth in 03/2019.[facility]. Discussed withOB GYN on-call Dr. Lavonna RuaArnold,who recommended general management for patient and can be consulted as needed. Recommended against any imaging due to radiation exposure at this time.    Today, patient denies any new complaints, reports feeling better, has been able to tolerate food. Discussed discharge plans with patient, to follow-up with PCP in 1 week and [facility], patient verbalized understanding.   Hospital Course:  Principal Problem:   Acute lower UTI Active Problems:   Type 1 diabetes mellitus with hyperglycemia, with long-term current use of insulin (HCC)   Diabetic gastroparesis (HCC)   Essential hypertension   Marijuana abuse   UTI (urinary tract infection)   UTI Afebrile, with resolved leukocytosis UA showed positive nitrites, negative leukocytes, rare bacteria,glucose greater than 500 UC no growth Switch IV ceftriaxone--> PO keflex to complete 7 days  Intractable nausea/vomiting History of gastroparesis Metabolic acidosis Improved Currently tolerating diet  Hyponatremia Likely 2/2 above S/P IV fluids  Type 1 diabetes mellitus,uncontrolled LastA1c 9.1 Continue insulin pump Follow up with endocrinology, PCP  Hypertensive crisis BP improved Continue home p.o. labetalol  Incidental early pregnancy Elevated hCG OB ultrasound showed confirmed probable early intrauterine gestational sac towards the uterine fundus, but no yolk sac, fetal pole or cardiac activity yet visualized History ofstillbirth in 03/2019 [facility], outpt follow up Discussed withOB GYN on-call Dr. Lavonna RuaArnold,who recommended generaladultmanagement for patient and can be consulted as needed. Recommended against any imaging due to radiation exposure at this time.  CKD stage IIIa Baseline creatinine around  1.2-1.3 Daily BMP  Anemia of chronic kidney disease Hemoglobin baseline around 10 No signs of bleeding, hx of melena or heavy periods Anemia panel showed iron 73, ferritin 9, sats 18, TIBC 412, vitamin B12 266 Follow-up with PCP/OB/GYN  Marijuana abuse UDS positive for marijuana  Patient advised to quit     Estimated body mass index is 20.27 kg/m as calculated from the following:   Height as of this encounter: 5\' 6"  (1.676 m).   Weight as of this encounter: 57 kg.    Procedures:  None  Consultations:  Discussed with OB/GYN  Discharge Exam: BP (!) 159/97 (BP Location: Left Arm)   Pulse 100   Temp 99 F (37.2 C) (Oral)   Resp 19   Ht 5\' 6"  (1.676 m)   Wt 57 kg   LMP 04/11/2020   SpO2 100%   BMI 20.27 kg/m   General: NAD Cardiovascular: S1, S2 present Respiratory: CTAB Abdomen: Soft, nontender, nondistended, bowel sounds present    Discharge Instructions You were cared for by a hospitalist during your hospital stay. If you have any questions about your discharge medications or the care you received while you were in the hospital after you are discharged, you can call the unit and asked to speak with the hospitalist on call if the hospitalist that took care of you is not available. Once you are discharged, your primary care physician will handle any further medical issues. Please note that NO REFILLS for any discharge medications will be authorized once you are discharged, as it is imperative that you return to your primary care physician (or establish a relationship with a primary care physician if you do not have one) for your aftercare needs so that they can reassess your need for medications and monitor your lab values.  Discharge Instructions    Diet - low sodium heart healthy   Complete by: As directed    Increase activity slowly   Complete by: As directed      Allergies as of 04/29/2020      Reactions   Penicillins Hives   Did it involve swelling of  the face/tongue/throat, SOB, or low BP? N Did it involve sudden or severe rash/hives, skin peeling, or any reaction on the inside of your mouth or nose? Y Did you need to seek medical attention at a hospital or doctor's office? Unknown When did it last happen?childhood If all above answers are "NO", may proceed with cephalosporin use.   Methocarbamol Nausea And Vomiting, Other (See Comments)   Body shaking, also   Azithromycin Hives      Medication List    TAKE these medications   acetaminophen 325 MG tablet Commonly known as: TYLENOL Take 2 tablets (650 mg total) by mouth every 6 (six) hours as needed for mild pain (or Fever >/= 101).   cephALEXin 500 MG capsule Commonly known as: KEFLEX Take 1 capsule (500 mg total) by mouth 2 (two) times daily for 2 days.   famotidine 20 MG tablet Commonly known as: PEPCID Take 1 tablet (20 mg total) by mouth 2 (two) times daily for 10 days.   HYDROcodone-acetaminophen 5-325 MG tablet Commonly known as: NORCO/VICODIN Take 1 tablet by mouth every 8 (eight) hours as needed for up to 2 days for moderate pain.   insulin aspart 100 UNIT/ML injection Commonly known as: novoLOG Inject 50 Units into the skin See admin instructions. Maximum of 50 units/day, per insulin pump   insulin pump Soln Inject into the skin continuous.   labetalol  300 MG tablet Commonly known as: NORMODYNE Take 600 mg by mouth 3 (three) times daily.      Allergies  Allergen Reactions  . Penicillins Hives    Did it involve swelling of the face/tongue/throat, SOB, or low BP? N Did it involve sudden or severe rash/hives, skin peeling, or any reaction on the inside of your mouth or nose? Y Did you need to seek medical attention at a hospital or doctor's office? Unknown When did it last happen?childhood If all above answers are "NO", may proceed with cephalosporin use.   . Methocarbamol Nausea And Vomiting and Other (See Comments)    Body shaking, also  .  Azithromycin Hives      The results of significant diagnostics from this hospitalization (including imaging, microbiology, ancillary and laboratory) are listed below for reference.    Significant Diagnostic Studies: US OB LESS THAN 14 WEEKS WITH OB TRANSVAGINAL  Result Date: 04/27/2020 CLINICAL DATA:  Abdominal pain for 3 days with emesis, quantitative hCG = 651, unknown LMP EXAM: OBSTETRIC <14 WK [place] AND TRANSVAGINAL OB US TECHNIQUE: Both transabdominal and transvaginal ultrasound examinations were performed for complete evaluation of the gestation as well as the maternal uterus, adnexal regions, and pelvic cul-de-sac. Transvaginal technique was performed to assess early pregnancy. COMPARISON:  CT 01/13/2020 FINDINGS: Intrauterine gestational sac: Possible single intrauterine gestational sac towards the uterine fundus (36, 37/86) Yolk sac:  Not Visualized. Embryo:  Not Visualized. Cardiac Activity: Not Visualized. MSD: 2.9 mm   5 w   0 d Subchorionic hemorrhage:  None visualized. Maternal uterus/adnexae: Anteverted maternal uterus with likely decidual reaction of the endometrium. Normal appearance of the right ovary. A slightly complex cystic focus is seen in the left ovary with peripheral color Doppler flow most compatible with a corpus luteum measuring up to 1.9 x 0.9 x 1.2 cm. Small volume anechoic free fluid is seen in the pelvis, nonspecific. IMPRESSION: Probable early intrauterine gestational sac towards the uterine fundus, but no yolk sac, fetal pole, or cardiac activity yet visualized. Recommend follow-up quantitative B-HCG levels and follow-up US in 14 days to assess viability. This recommendation follows SRU consensus guidelines: Diagnostic Criteria for Nonviable Pregnancy Early in the First Trimester. Malva Limes Med 2013; 354:6568-12. Small volume anechoic free fluid in the pelvis, nonspecific but can be a physiologic finding. Electronically Signed   By: Kreg Shropshire M.D.   On: 04/27/2020 15:26     Microbiology: Recent Results (from the past 240 hour(s))  Culture, Urine     Status: None   Collection Time: 04/27/20  7:11 AM   Specimen: Urine, Random  Result Value Ref Range Status   Specimen Description URINE, RANDOM  Final   Special Requests NONE  Final   Culture   Final    NO GROWTH Performed at [facility], [address] [place], [place] [address]    Report Status 04/29/2020 FINAL  Final  SARS CORONAVIRUS 2 (TAT 6-24 HRS) Nasopharyngeal Nasopharyngeal Swab     Status: None   Collection Time: 04/27/20  3:40 PM   Specimen: Nasopharyngeal Swab  Result Value Ref Range Status   SARS Coronavirus 2 NEGATIVE NEGATIVE Final    Comment: (NOTE) SARS-CoV-2 target nucleic acids are NOT DETECTED.  The SARS-CoV-2 RNA is generally detectable in upper and lower respiratory specimens during the acute phase of infection. Negative results do not preclude SARS-CoV-2 infection, do not rule out co-infections with other pathogens, and should not be used as the sole basis for  treatment or other patient management decisions. Negative results must be combined with clinical observations, patient history, and epidemiological information. The expected result is Negative.  Fact Sheet for Patients: HairSlick.no  Fact Sheet for Healthcare Providers: quierodirigir.com  This test is not yet approved or cleared by the [place] FDA and  has been authorized for detection and/or diagnosis of SARS-CoV-2 by FDA under an Emergency Use Authorization (EUA). This EUA will remain  in effect (meaning this test can be used) for the duration of the COVID-19 declaration under Se ction 564(b)(1) of the Act, 21 U.S.C. section 360bbb-3(b)(1), unless the authorization is terminated or revoked sooner.  Performed at [facility], [address] [place], [place] [address]      Labs: Basic Metabolic Panel: Recent Labs  Lab  04/27/20 1142 04/27/20 1859 04/28/20 0123 04/29/20 0141 04/29/20 0728  NA 133* 133* 131* 132*  --   K 3.8 3.7 3.7 3.3*  --   CL 103 101 101 105  --   CO2 18* 22 18* 21*  --   GLUCOSE 252* 106* 238* 113*  --   BUN 10 11 14 13   --   CREATININE 1.24* 1.14* 1.25* 1.21*  --   CALCIUM 9.0 9.3 9.1 8.3*  --   MG  --   --   --   --  1.6*   Liver Function Tests: Recent Labs  Lab 04/27/20 1142  AST 18  ALT 10  ALKPHOS 78  BILITOT 0.9  PROT 7.0  ALBUMIN 3.9   No results for input(s): LIPASE, AMYLASE in the last 168 hours. No results for input(s): AMMONIA in the last 168 hours. CBC: Recent Labs  Lab 04/27/20 1142 04/28/20 0123 04/29/20 0141  WBC 11.9* 17.4* 12.0*  NEUTROABS 8.2*  --  7.4  HGB 10.6* 11.1* 8.7*  HCT 32.9* 35.7* 26.6*  MCV 80.6 81.3 78.9*  PLT 280 288 241   Cardiac Enzymes: No results for input(s): CKTOTAL, CKMB, CKMBINDEX, TROPONINI in the last 168 hours. BNP: BNP (last 3 results) No results for input(s): BNP in the last 8760 hours.  ProBNP (last 3 results) No results for input(s): PROBNP in the last 8760 hours.  CBG: Recent Labs  Lab 04/28/20 1532 04/28/20 2003 04/29/20 0029 04/29/20 0427 04/29/20 0757  GLUCAP 242* 123* 113* 131* 212*       Signed:  06/29/20, MD [facility] 04/29/2020, 11:33 AM

## 2020-04-29 NOTE — Progress Notes (Addendum)
PROGRESS NOTE  Tamara Lane ERX:[id number] DOB: 08/05/1985 DOA: 04/27/2020 PCP: Patient, No Pcp Per (Inactive)  HPI/Recap of past 24 hours: Tamara Lane is a 35 y.o. female with medical history significant for hypertension, type 1 diabetes mellitus on insulin pump, gastroparesis, CKD stage IIIa presents to the ED complaining of 3 days of intractable nausea and vomiting, unable to keep any solids or liquids down, also complained of generalized abdominal pain, 8/10, worse around the epigastric region ongoing for about 3 days as well.  Patient usually uses an insulin pump but reports fell out on her way coming here.  Patient has a history of gastroparesis and is status post gastric stimulator.  Patient also reports some dysuria that has been going on for the past 2 weeks intermittently, but did not seek any medical attention.  Patient denies any fever/chills, any sick contacts, denies any chest pain, shortness of breath.  Of note, patient reports using marijuana on a regular, but has not used it in 2 weeks, denies any other drug use. In the ED, patient noted to have hypertensive crisis with SBP in the 200s, tachycardic, mild leukocytosis, hyponatremia.  Patient was noted to have an elevated beta-hCG, OB ultrasound done confirmed probable early intrauterine gestational sac towards the uterine fundus, but no yolk sac, fetal pole or cardiac activity yet visualized.  Of note, patient was not aware of the pregnancy, reports that she recently had a stillbirth in 03/2019.  Patient follows up with [facility] OB[facility]. Discussed with OB GYN on-call Dr. Debroah Loop, who recommended general management for patient and can be consulted as needed.  Recommended against any imaging due to radiation exposure at this time.    Today, patient still complaining of back pain radiating across her abdomen, patient reporting pain may be from muscle strain from recurrent vomiting.  Pain appears to be somewhat exaggerated.  Unfortunately  further imaging cannot be done at the moment due to pregnancy.  Currently tolerating her diet well.    Assessment/Plan: Principal Problem:   Acute lower UTI Active Problems:   Type 1 diabetes mellitus with hyperglycemia, with long-term current use of insulin (HCC)   Diabetic gastroparesis (HCC)   Essential hypertension   Marijuana abuse   UTI (urinary tract infection)   UTI Afebrile, with leukocytosis UA showed positive nitrites, negative leukocytes, rare bacteria, glucose greater than 500 UC shows no growth Continue IV ceftriaxone  Intractable nausea/vomiting History of gastroparesis Metabolic acidosis Soft diet As needed Phenergan, morphine (okay to use by OB/GYN) S/p IV fluids  Hyponatremia Likely 2/2 above S/p IV fluids  Hypokalemia/hypomagnesemia Replace as needed  Type 1 diabetes mellitus, uncontrolled Last A1c 9.1 Noted hypoglycemia on 04/29/2020 Diabetes coordinator on board, patient lost insulin pump Continue SSI, Lantus, CBGs every 4 hours, hypoglycemic protocol Monitor BMP closely, at risk for DKA  Hypertensive crisis BP improving IV labetalol as needed for now, continue home p.o. labetalol Telemetry  Incidental early pregnancy Elevated hCG OB ultrasound showed confirmed probable early intrauterine gestational sac towards the uterine fundus, but no yolk sac, fetal pole or cardiac activity yet visualized History of stillbirth in 03/2019 Patient follows up with [facility] OB/g[facility], outpt follow up Discussed with OB GYN on-call Dr. Debroah Loop, who recommended general adult management for patient and can be consulted as needed.  Recommended against any imaging due to radiation exposure at this time.  CKD stage IIIa Baseline creatinine around 1.2-1.3 Daily BMP  Anemia of chronic kidney disease Hemoglobin dropped, likely hemodilution Anemia panel pending Daily  CBC  Marijuana abuse UDS positive for marijuana  Patient advised to quit     Estimated body mass index is 20.27 kg/m as calculated from the following:   Height as of this encounter: 5\' 6"  (1.676 m).   Weight as of this encounter: 57 kg.     Code Status: Full  Family Communication: Discussed with patient  Disposition Plan: Status is: Inpatient  The patient will require care spanning > 2 midnights and should be moved to inpatient because: Inpatient level of care appropriate due to severity of illness  Dispo: The patient is from: Home              Anticipated d/c is to: Home              Patient currently is not medically stable to d/c.   Difficult to place patient No    Consultants:  Discussed with OB/GYN  Procedures:  None  Antimicrobials:  Ceftriaxone  DVT prophylaxis: Lovenox   Objective: Vitals:   04/29/20 0756 04/29/20 0900 04/29/20 1100 04/29/20 1143  BP: (!) 159/97   (!) 157/93  Pulse: 100   89  Resp: 20  19 20   Temp: 99 F (37.2 C)   99 F (37.2 C)  TempSrc: Oral   Oral  SpO2: 100%   100%  Weight:  57 kg 57 kg   Height:        Intake/Output Summary (Last 24 hours) at 04/29/2020 1217 Last data filed at 04/28/2020 2006 Gross per 24 hour  Intake 3 ml  Output --  Net 3 ml   Filed Weights   04/29/20 0900 04/29/20 1100  Weight: 57 kg 57 kg    Exam:  General: NAD   Cardiovascular: S1, S2 present  Respiratory: CTAB  Abdomen: Soft, nontender, nondistended, bowel sounds present  Musculoskeletal: No bilateral pedal edema noted, back pain  Skin: Normal  Psychiatry:  Normal mood    Data Reviewed: CBC: Recent Labs  Lab 04/27/20 1142 04/28/20 0123 04/29/20 0141  WBC 11.9* 17.4* 12.0*  NEUTROABS 8.2*  --  7.4  HGB 10.6* 11.1* 8.7*  HCT 32.9* 35.7* 26.6*  MCV 80.6 81.3 78.9*  PLT 280 288 241   Basic Metabolic Panel: Recent Labs  Lab 04/27/20 1142 04/27/20 1859 04/28/20 0123 04/29/20 0141 04/29/20 0728  NA 133* 133* 131* 132*  --   K 3.8 3.7 3.7 3.3*  --   CL 103 101 101 105  --   CO2 18* 22 18*  21*  --   GLUCOSE 252* 106* 238* 113*  --   BUN 10 11 14 13   --   CREATININE 1.24* 1.14* 1.25* 1.21*  --   CALCIUM 9.0 9.3 9.1 8.3*  --   MG  --   --   --   --  1.6*   GFR: Estimated Creatinine Clearance: 58.9 mL/min (A) (by C-G formula based on SCr of 1.21 mg/dL (H)). Liver Function Tests: Recent Labs  Lab 04/27/20 1142  AST 18  ALT 10  ALKPHOS 78  BILITOT 0.9  PROT 7.0  ALBUMIN 3.9   No results for input(s): LIPASE, AMYLASE in the last 168 hours. No results for input(s): AMMONIA in the last 168 hours. Coagulation Profile: No results for input(s): INR, PROTIME in the last 168 hours. Cardiac Enzymes: No results for input(s): CKTOTAL, CKMB, CKMBINDEX, TROPONINI in the last 168 hours. BNP (last 3 results) No results for input(s): PROBNP in the last 8760 hours. HbA1C: Recent Labs  04/27/20 1909  HGBA1C 9.1*   CBG: Recent Labs  Lab 04/28/20 2003 04/29/20 0029 04/29/20 0427 04/29/20 0757 04/29/20 1142  GLUCAP 123* 113* 131* 212* 69*   Lipid Profile: No results for input(s): CHOL, HDL, LDLCALC, TRIG, CHOLHDL, LDLDIRECT in the last 72 hours. Thyroid Function Tests: No results for input(s): TSH, T4TOTAL, FREET4, T3FREE, THYROIDAB in the last 72 hours. Anemia Panel: No results for input(s): VITAMINB12, FOLATE, FERRITIN, TIBC, IRON, RETICCTPCT in the last 72 hours. Urine analysis:    Component Value Date/Time   COLORURINE YELLOW 04/27/2020 1200   APPEARANCEUR CLEAR 04/27/2020 1200   LABSPEC 1.021 04/27/2020 1200   PHURINE 6.0 04/27/2020 1200   GLUCOSEU >=500 (A) 04/27/2020 1200   HGBUR NEGATIVE 04/27/2020 1200   BILIRUBINUR NEGATIVE 04/27/2020 1200   KETONESUR 80 (A) 04/27/2020 1200   PROTEINUR 100 (A) 04/27/2020 1200   NITRITE POSITIVE (A) 04/27/2020 1200   LEUKOCYTESUR NEGATIVE 04/27/2020 1200   Sepsis Labs: @LABRCNTIP (procalcitonin:4,lacticidven:4)  ) Recent Results (from the past 240 hour(s))  Culture, Urine     Status: None   Collection Time:  04/27/20  7:11 AM   Specimen: Urine, Random  Result Value Ref Range Status   Specimen Description URINE, RANDOM  Final   Special Requests NONE  Final   Culture   Final    NO GROWTH Performed at [facility], [address], [place], [place]    Report Status 04/29/2020 FINAL  Final  SARS CORONAVIRUS 2 (TAT 6-24 HRS) Nasopharyngeal Nasopharyngeal Swab     Status: None   Collection Time: 04/27/20  3:40 PM   Specimen: Nasopharyngeal Swab  Result Value Ref Range Status   SARS Coronavirus 2 NEGATIVE NEGATIVE Final    Comment: (NOTE) SARS-CoV-2 target nucleic acids are NOT DETECTED.  The SARS-CoV-2 RNA is generally detectable in upper and lower respiratory specimens during the acute phase of infection. Negative results do not preclude SARS-CoV-2 infection, do not rule out co-infections with other pathogens, and should not be used as the sole basis for treatment or other patient management decisions. Negative results must be combined with clinical observations, patient history, and epidemiological information. The expected result is Negative.  Fact Sheet for Patients: 06/27/20  Fact Sheet for Healthcare Providers: HairSlick.no  This test is not yet approved or cleared by the quierodirigir.com FDA and  has been authorized for detection and/or diagnosis of SARS-CoV-2 by FDA under an Emergency Use Authorization (EUA). This EUA will remain  in effect (meaning this test can be used) for the duration of the COVID-19 declaration under Se ction 564(b)(1) of the Act, 21 U.S.C. section 360bbb-3(b)(1), unless the authorization is terminated or revoked sooner.  Performed at [facility], [address], [place], [place]       Studies: No results found.  Scheduled Meds: . enoxaparin (LOVENOX) injection  40 mg Subcutaneous Q24H  . insulin aspart  0-9 Units Subcutaneous Q4H  . insulin glargine  14  Units Subcutaneous Daily  . labetalol  600 mg Oral TID  . sodium chloride flush  3 mL Intravenous Q12H    Continuous Infusions: . cefTRIAXone (ROCEPHIN)  IV 2 g (04/28/20 1534)  . famotidine (PEPCID) IV 20 mg (04/28/20 1828)  . magnesium sulfate bolus IVPB 2 g (04/29/20 1207)  . promethazine (PHENERGAN) injection Stopped (04/27/20 2309)     LOS: 1 day     06/27/20, MD [facility]  If 7PM-7AM, please contact night-coverage [email or web address] 04/29/2020, 12:17 PM

## 2020-04-30 LAB — VITAMIN B12: Vitamin B-12: 266 pg/mL (ref 180–914)

## 2020-04-30 LAB — CBC WITH DIFFERENTIAL/PLATELET
Abs Immature Granulocytes: 0.02 10*3/uL (ref 0.00–0.07)
Basophils Absolute: 0.1 10*3/uL (ref 0.0–0.1)
Basophils Relative: 1 %
Eosinophils Absolute: 0.1 10*3/uL (ref 0.0–0.5)
Eosinophils Relative: 1 %
HCT: 28.7 % — ABNORMAL LOW (ref 36.0–46.0)
Hemoglobin: 9.1 g/dL — ABNORMAL LOW (ref 12.0–15.0)
Immature Granulocytes: 0 %
Lymphocytes Relative: 34 %
Lymphs Abs: 3.3 10*3/uL (ref 0.7–4.0)
MCH: 26.2 pg (ref 26.0–34.0)
MCHC: 31.7 g/dL (ref 30.0–36.0)
MCV: 82.7 fL (ref 80.0–100.0)
Monocytes Absolute: 0.8 10*3/uL (ref 0.1–1.0)
Monocytes Relative: 8 %
Neutro Abs: 5.4 10*3/uL (ref 1.7–7.7)
Neutrophils Relative %: 56 %
Platelets: 229 10*3/uL (ref 150–400)
RBC: 3.47 MIL/uL — ABNORMAL LOW (ref 3.87–5.11)
RDW: 16.2 % — ABNORMAL HIGH (ref 11.5–15.5)
WBC: 9.7 10*3/uL (ref 4.0–10.5)
nRBC: 0 % (ref 0.0–0.2)

## 2020-04-30 LAB — IRON AND TIBC
Iron: 73 ug/dL (ref 28–170)
Saturation Ratios: 18 % (ref 10.4–31.8)
TIBC: 412 ug/dL (ref 250–450)
UIBC: 339 ug/dL

## 2020-04-30 LAB — GLUCOSE, CAPILLARY
Glucose-Capillary: 125 mg/dL — ABNORMAL HIGH (ref 70–99)
Glucose-Capillary: 125 mg/dL — ABNORMAL HIGH (ref 70–99)
Glucose-Capillary: 123 mg/dL — ABNORMAL HIGH (ref 70–99)
Glucose-Capillary: 147 mg/dL — ABNORMAL HIGH (ref 70–99)
Glucose-Capillary: 264 mg/dL — ABNORMAL HIGH (ref 70–99)

## 2020-04-30 LAB — BASIC METABOLIC PANEL
Anion gap: 6 (ref 5–15)
BUN: 7 mg/dL (ref 6–20)
CO2: 20 mmol/L — ABNORMAL LOW (ref 22–32)
Calcium: 8.2 mg/dL — ABNORMAL LOW (ref 8.9–10.3)
Chloride: 107 mmol/L (ref 98–111)
Creatinine, Ser: 1.17 mg/dL — ABNORMAL HIGH (ref 0.44–1.00)
GFR, Estimated: >60 mL/min (ref >60)
Glucose, Bld: 69 mg/dL — ABNORMAL LOW (ref 70–99)
Potassium: 3.4 mmol/L — ABNORMAL LOW (ref 3.5–5.1)
Sodium: 133 mmol/L — ABNORMAL LOW (ref 135–145)

## 2020-04-30 LAB — FOLATE: Folate: 13.2 ng/mL (ref >5.9)

## 2020-04-30 LAB — FERRITIN: Ferritin: 9 ng/mL — ABNORMAL LOW (ref 11–307)

## 2020-04-30 MED ORDER — DEXTROSE 50 % IV SOLN
25.0000 mL | Freq: Once | INTRAVENOUS | Status: AC
  Administered 2020-04-30: 25 mL via INTRAVENOUS

## 2020-04-30 MED ORDER — DEXTROSE 50 % IV SOLN
INTRAVENOUS | Status: AC
  Administered 2020-04-30: 25 mL via INTRAVENOUS
  Filled 2020-04-30: qty 50

## 2020-04-30 MED ORDER — VITAMIN B-12 1000 MCG PO TABS
1000.0000 ug | ORAL_TABLET | Freq: Every day | ORAL | Status: DC
  Administered 2020-04-30: 1000 ug via ORAL
  Filled 2020-04-30: qty 1

## 2020-04-30 MED ORDER — CEPHALEXIN 500 MG PO CAPS: 500.0000 mg | ORAL_CAPSULE | Freq: Two times a day (BID) | ORAL | 0 refills | Status: AC

## 2020-04-30 MED ORDER — CYANOCOBALAMIN 1000 MCG PO TABS: 1000.0000 ug | ORAL_TABLET | Freq: Every day | ORAL | 0 refills | Status: AC

## 2020-04-30 NOTE — Progress Notes (Signed)
Met with pt prior to discharge today to discuss extreme importance of good CBG control at home to maintain healthy pregnancy.  Pt had the covers up to her chin and would not keep her eyes open long enough to talk with me despite several efforts to wake pt and have conversation.  I did manage to strongly encourage pt to make follow up appt with her ENDO soon after d/c so that pt can maintain healthy CBGs during pregnancy.  Pt stated to me she was waiting for the ENDO office to call her back with appt date/time.  After this, she closed her eyes and immediately fell asleep.    --Will follow patient during hospitalization--  Wyn Quaker RN, MSN, CDE Diabetes Coordinator Inpatient Glycemic Control Team Team Pager: [phone] (8a-5p)

## 2020-05-27 ENCOUNTER — Other Ambulatory Visit: Payer: Self-pay

## 2020-05-27 ENCOUNTER — Encounter (HOSPITAL_COMMUNITY): Payer: Self-pay | Admitting: Obstetrics and Gynecology

## 2020-05-27 ENCOUNTER — Inpatient Hospital Stay (HOSPITAL_COMMUNITY)
Admission: AD | Admit: 2020-05-27 | Discharge: 2020-05-27 | Disposition: A | Discharge status: Home / Self Care | Payer: Medicare Other | Attending: Obstetrics and Gynecology | Admitting: Obstetrics and Gynecology

## 2020-05-27 ENCOUNTER — Inpatient Hospital Stay (HOSPITAL_COMMUNITY): Payer: Medicare Other

## 2020-05-27 DIAGNOSIS — O10011 Pre-existing essential hypertension complicating pregnancy, first trimester: Secondary | ICD-10-CM | POA: Diagnosis not present

## 2020-05-27 DIAGNOSIS — O209 Hemorrhage in early pregnancy, unspecified: Secondary | ICD-10-CM | POA: Insufficient documentation

## 2020-05-27 DIAGNOSIS — Z3491 Encounter for supervision of normal pregnancy, unspecified, first trimester: Secondary | ICD-10-CM

## 2020-05-27 DIAGNOSIS — Z3A09 9 weeks gestation of pregnancy: Secondary | ICD-10-CM | POA: Insufficient documentation

## 2020-05-27 DIAGNOSIS — O24011 Pre-existing diabetes mellitus, type 1, in pregnancy, first trimester: Secondary | ICD-10-CM | POA: Diagnosis not present

## 2020-05-27 DIAGNOSIS — Z794 Long term (current) use of insulin: Secondary | ICD-10-CM | POA: Insufficient documentation

## 2020-05-27 DIAGNOSIS — Z6790 Unspecified blood type, Rh positive: Secondary | ICD-10-CM

## 2020-05-27 DIAGNOSIS — O10911 Unspecified pre-existing hypertension complicating pregnancy, first trimester: Secondary | ICD-10-CM

## 2020-05-27 DIAGNOSIS — E109 Type 1 diabetes mellitus without complications: Secondary | ICD-10-CM | POA: Diagnosis not present

## 2020-05-27 LAB — URINALYSIS, ROUTINE W REFLEX MICROSCOPIC
Bacteria, UA: NONE SEEN
Bilirubin Urine: NEGATIVE
Glucose, UA: >=500 mg/dL — AB
Ketones, ur: 5 mg/dL — AB
Leukocytes,Ua: NEGATIVE
Nitrite: NEGATIVE
Protein, ur: 100 mg/dL — AB
Specific Gravity, Urine: 1.024 (ref 1.005–1.030)
pH: 6 (ref 5.0–8.0)

## 2020-05-27 LAB — CBC
HCT: 32.7 % — ABNORMAL LOW (ref 36.0–46.0)
Hemoglobin: 10.6 g/dL — ABNORMAL LOW (ref 12.0–15.0)
MCH: 26.4 pg (ref 26.0–34.0)
MCHC: 32.4 g/dL (ref 30.0–36.0)
MCV: 81.5 fL (ref 80.0–100.0)
Platelets: 325 10*3/uL (ref 150–400)
RBC: 4.01 MIL/uL (ref 3.87–5.11)
RDW: 16.3 % — ABNORMAL HIGH (ref 11.5–15.5)
WBC: 15.7 10*3/uL — ABNORMAL HIGH (ref 4.0–10.5)
nRBC: 0 % (ref 0.0–0.2)

## 2020-05-27 LAB — WET PREP, GENITAL
Sperm: NONE SEEN
Trich, Wet Prep: NONE SEEN
Yeast Wet Prep HPF POC: NONE SEEN

## 2020-05-27 LAB — HCG, QUANTITATIVE, PREGNANCY: hCG, Beta Chain, Quant, S: 201086 m[IU]/mL — ABNORMAL HIGH (ref <5)

## 2020-05-27 MED ORDER — LABETALOL HCL 100 MG PO TABS
600.0000 mg | ORAL_TABLET | Freq: Once | ORAL | Status: AC
  Administered 2020-05-27: 600 mg via ORAL
  Filled 2020-05-27: qty 6

## 2020-05-27 NOTE — MAU Provider Note (Signed)
Chief Complaint: Vaginal Bleeding   Event Date/Time   First Provider Initiated Contact with Patient 05/27/20 1818      SUBJECTIVE HPI: Tamara Lane is a 35 y.o. G3P0101 at [redacted]w[redacted]d by LMP with medical hx significant for Type I DM with hospital admission on 04/27/20 and CHTN on labetalol 600 mg TID, who presents to maternity admissions reporting onset of dark red vaginal bleeding today. The bleeding is like a period with medium sized clots, and requires a pad but she came in before a full pad was soaked.  There is no pain associated with the bleeding. She has not tried any treatments.   Pt reports she took 1 dose of labetalol 600 mg today but is due a dose now. It is prescribed TID.  She denies any h/a, shortness of breath, or chest pain.  She has a prenatal appointment with [facility] on 05/28/20 to begin care.   HPI  Past Medical History:  Diagnosis Date  . Diabetes mellitus without complication (HCC)   . Gastroparesis   . Hypertension   . Marijuana abuse 01/13/2020  . Renal insufficiency affecting pregnancy    Past Surgical History:  Procedure Laterality Date  . CESAREAN SECTION    . GASTRIC STIMULATOR IMPLANT SURGERY  2016   Social History   Socioeconomic History  . Marital status: Single    Spouse name: Not on file  . Number of children: Not on file  . Years of education: Not on file  . Highest education level: Not on file  Occupational History  . Occupation: server/bartender  Tobacco Use  . Smoking status: Never Smoker  . Smokeless tobacco: Never Used  Substance and Sexual Activity  . Alcohol use: Not Currently  . Drug use: Yes    Types: Marijuana    Comment: intermittent use  . Sexual activity: Not on file  Other Topics Concern  . Not on file  Social History Narrative  . Not on file   Social Determinants of Health   Financial Resource Strain: Not on file  Food Insecurity: Not on file  Transportation Needs: Not on file  Physical Activity: Not on file   Stress: Not on file  Social Connections: Not on file  Intimate Partner Violence: Not on file   No current facility-administered medications on file prior to encounter.   Current Outpatient Medications on File Prior to Encounter  Medication Sig Dispense Refill  . labetalol (NORMODYNE) 300 MG tablet Take 600 mg by mouth 3 (three) times daily.    Marland Kitchen acetaminophen (TYLENOL) 325 MG tablet Take 2 tablets (650 mg total) by mouth every 6 (six) hours as needed for mild pain (or Fever >/= 101).    . famotidine (PEPCID) 20 MG tablet Take 1 tablet (20 mg total) by mouth 2 (two) times daily for 10 days. 20 tablet 0  . insulin aspart (NOVOLOG) 100 UNIT/ML injection Inject 50 Units into the skin See admin instructions. Maximum of 50 units/day, per insulin pump    . Insulin Human (INSULIN PUMP) SOLN Inject into the skin continuous.    . vitamin B-12 1000 MCG tablet Take 1 tablet (1,000 mcg total) by mouth daily. 30 tablet 0   Allergies  Allergen Reactions  . Penicillins Hives    Did it involve swelling of the face/tongue/throat, SOB, or low BP? N Did it involve sudden or severe rash/hives, skin peeling, or any reaction on the inside of your mouth or nose? Y Did you need to seek medical attention at  a hospital or doctor's office? Unknown When did it last happen?childhood If all above answers are "NO", may proceed with cephalosporin use.   . Methocarbamol Nausea And Vomiting and Other (See Comments)    Body shaking, also  . Azithromycin Hives    ROS:  Review of Systems  Constitutional: Negative for chills, fatigue and fever.  Respiratory: Negative for shortness of breath.   Cardiovascular: Negative for chest pain.  Gastrointestinal: Negative for abdominal pain, nausea and vomiting.  Genitourinary: Positive for vaginal bleeding. Negative for difficulty urinating, dysuria, flank pain, pelvic pain, vaginal discharge and vaginal pain.  Neurological: Negative for dizziness and headaches.   Psychiatric/Behavioral: Negative.      I have reviewed patient's Past Medical Hx, Surgical Hx, Family Hx, Social Hx, medications and allergies.   Physical Exam   Patient Vitals for the past 24 hrs:  BP Temp Pulse Resp SpO2 Height Weight  05/27/20 2016 (!) 152/84 -- 94 -- -- -- --  05/27/20 2001 (!) 165/87 -- 93 -- -- -- --  05/27/20 1945 (!) 178/90 -- 92 -- -- -- --  05/27/20 1930 (!) 182/91 -- 89 -- -- -- --  05/27/20 1915 (!) 182/95 -- 91 -- -- -- --  05/27/20 1908 (!) 179/82 -- 88 -- -- -- --  05/27/20 1800 (!) 193/93 -- 91 -- -- -- --  05/27/20 1755 -- -- -- -- 100 % -- --  05/27/20 1750 -- -- -- -- 100 % -- --  05/27/20 1745 (!) 203/99 -- 91 -- 100 % -- --  05/27/20 1733 (!) 198/112 98.6 F (37 C) -- 15 100 % 5\' 6"  (1.676 m) 53 kg   Constitutional: Well-developed, well-nourished female in no acute distress.  HEART: normal rate, heart sounds, regular rhythm RESP: normal effort, lung sounds clear and equal bilaterally GI: Abd soft, non-tender. Pos BS x 4 MS: Extremities nontender, no edema, normal ROM Neurological - alert, oriented, normal speech, no focal findings or movement disorder noted, screening mental status exam normal, cranial nerves II through XII intact, DTR's normal and symmetric, normal muscle tone, no tremors, strength 5/5  GU: Neg CVAT.  PELVIC EXAM: Cervix pink, visually closed, without lesion, moderate amount of dark red bleeding with 1 fox swab used to visualize cervix, vaginal walls and external genitalia normal  LAB RESULTS Results for orders placed or performed during the hospital encounter of 05/27/20 (from the past 24 hour(s))  Urinalysis, Routine w reflex microscopic Urine, Clean Catch     Status: Abnormal   Collection Time: 05/27/20  5:29 PM  Result Value Ref Range   Color, Urine YELLOW YELLOW   APPearance HAZY (A) CLEAR   Specific Gravity, Urine 1.024 1.005 - 1.030   pH 6.0 5.0 - 8.0   Glucose, UA >=500 (A) NEGATIVE mg/dL   Hgb urine  dipstick MODERATE (A) NEGATIVE   Bilirubin Urine NEGATIVE NEGATIVE   Ketones, ur 5 (A) NEGATIVE mg/dL   Protein, ur 07/27/20 (A) NEGATIVE mg/dL   Nitrite NEGATIVE NEGATIVE   Leukocytes,Ua NEGATIVE NEGATIVE   RBC / HPF 21-50 0 - 5 RBC/hpf   WBC, UA 0-5 0 - 5 WBC/hpf   Bacteria, UA NONE SEEN NONE SEEN   Squamous Epithelial / LPF 0-5 0 - 5   Mucus PRESENT   hCG, quantitative, pregnancy     Status: Abnormal   Collection Time: 05/27/20  6:04 PM  Result Value Ref Range   hCG, Beta Chain, Quant, S 201,086 (H) <5 mIU/mL  ABO/Rh  Status: None   Collection Time: 05/27/20  6:04 PM  Result Value Ref Range   ABO/RH(D)      O POS Performed at [facility], [address] [place], [place] [address]   CBC     Status: Abnormal   Collection Time: 05/27/20  6:04 PM  Result Value Ref Range   WBC 15.7 (H) 4.0 - 10.5 K/uL   RBC 4.01 3.87 - 5.11 MIL/uL   Hemoglobin 10.6 (L) 12.0 - 15.0 g/dL   HCT 30.832.7 (L) 65.736.0 - 84.646.0 %   MCV 81.5 80.0 - 100.0 fL   MCH 26.4 26.0 - 34.0 pg   MCHC 32.4 30.0 - 36.0 g/dL   RDW 96.216.3 (H) 95.211.5 - 84.115.5 %   Platelets 325 150 - 400 K/uL   nRBC 0.0 0.0 - 0.2 %  Wet prep, genital     Status: Abnormal   Collection Time: 05/27/20  6:30 PM   Specimen: Genital  Result Value Ref Range   Yeast Wet Prep HPF POC NONE SEEN NONE SEEN   Trich, Wet Prep NONE SEEN NONE SEEN   Clue Cells Wet Prep HPF POC PRESENT (A) NONE SEEN   WBC, Wet Prep HPF POC FEW (A) NONE SEEN   Sperm NONE SEEN     --/--/O POS Performed at [facility], [address] [place], [place] [address]  [phone](05/01 1804)  IMAGING US OB Transvaginal  Result Date: 05/27/2020 CLINICAL DATA:  Vaginal bleeding EXAM: TRANSVAGINAL OB ULTRASOUND TECHNIQUE: Transvaginal ultrasound was performed for complete evaluation of the gestation as well as the maternal uterus, adnexal regions, and pelvic cul-de-sac. COMPARISON:  04/27/2020 FINDINGS: Intrauterine gestational sac: Single Yolk sac:  Visualized. Embryo:  Visualized.  Cardiac Activity: Visualized. Heart Rate: 178 bpm CRL:   19 mm   8 w 3 d                  US EDC: 01/03/2021 Subchorionic hemorrhage:  None visualized. Maternal uterus/adnexae: The ovaries are unremarkable. There is a small volume of pelvic free fluid. IMPRESSION: Single live IUP at 8 weeks and 3 days Electronically Signed   By: Katherine Mantlehristopher  Green M.D.   On: 05/27/2020 19:22    MAU Management/MDM: Orders Placed This Encounter  Procedures  . Wet prep, genital  . US OB Transvaginal  . Urinalysis, Routine w reflex microscopic Urine, Clean Catch  . hCG, quantitative, pregnancy  . CBC  . ABO/Rh  . Discharge patient    Meds ordered this encounter  Medications  . labetalol (NORMODYNE) tablet 600 mg    IUP noted on today's US. Pt with DM, but no s/sx of DKA today.  CHTN with severe range BPs, pt inconsistently taking labetalol.  Reports she needed a second medication in her last pregnancy. Pt without chest pain, h/a, or shortness of breath. No acute findings today, pt is well appearing.     Consult Dr Vergie LivingPickens with assessment and findings.  Follow up with BP check this week, begin prenatal care as soon as possible.  [facility], 05/[facility]/20, per pt.  Reviewed reasons to return to MAU for emergencies.    ASSESSMENT 1. Normal IUP (intrauterine pregnancy) on prenatal ultrasound, first trimester   2. Vaginal bleeding in pregnancy, first trimester   3. Type 1 diabetes mellitus affecting pregnancy in first trimester, antepartum   4. Chronic hypertension complicating or reason for care during pregnancy, first trimester   5. Blood type, Rh positive  PLAN Discharge home Allergies as of 05/27/2020      Reactions   Penicillins Hives   Did it involve swelling of the face/tongue/throat, SOB, or low BP? N Did it involve sudden or severe rash/hives, skin peeling, or any reaction on the inside of your mouth or nose? Y Did you need to seek medical attention at a hospital or  doctor's office? Unknown When did it last happen?childhood If all above answers are "NO", may proceed with cephalosporin use.   Methocarbamol Nausea And Vomiting, Other (See Comments)   Body shaking, also   Azithromycin Hives      Medication List    TAKE these medications   acetaminophen 325 MG tablet Commonly known as: TYLENOL Take 2 tablets (650 mg total) by mouth every 6 (six) hours as needed for mild pain (or Fever >/= 101).   cyanocobalamin 1000 MCG tablet Take 1 tablet (1,000 mcg total) by mouth daily.   famotidine 20 MG tablet Commonly known as: PEPCID Take 1 tablet (20 mg total) by mouth 2 (two) times daily for 10 days.   insulin aspart 100 UNIT/ML injection Commonly known as: novoLOG Inject 50 Units into the skin See admin instructions. Maximum of 50 units/day, per insulin pump   insulin pump Soln Inject into the skin continuous.   labetalol 300 MG tablet Commonly known as: NORMODYNE Take 600 mg by mouth 3 (three) times daily.        Sharen Counter Certified Nurse-Midwife 05/27/2020  8:48 PM

## 2020-05-27 NOTE — MAU Note (Signed)
.  Tamara Lane is a 35 y.o. at [redacted]w[redacted]d here in MAU reporting: vaginal bleeding that started around 1700 this afternoon. It is bright red. Patient is wearing a pad, has not passed any clots. No recent intercourse. Denies any pain. Seeks care in [facility], has appointment tomorrow.    Lab orders placed from triage: UA

## 2020-05-28 LAB — GC/CHLAMYDIA PROBE AMP (~~LOC~~) NOT AT ARMC
Chlamydia: NEGATIVE
Comment: NEGATIVE
Comment: NORMAL
Neisseria Gonorrhea: NEGATIVE

## 2020-05-28 LAB — ABO/RH: ABO/RH(D): O POS

## 2020-08-09 ENCOUNTER — Inpatient Hospital Stay: Admit: 2020-08-09 | Discharge: 2020-08-09 | Payer: MEDICARE

## 2020-08-09 DIAGNOSIS — Z3401 Encounter for supervision of normal first pregnancy, first trimester: Secondary | ICD-10-CM

## 2020-08-10 ENCOUNTER — Encounter

## 2020-08-14 LAB — QUAD SCREEN
AFP MoM: 0.94
AFP, Serum: 49.3 ng/mL
Age Risk Down Syndrome: 1:312 {titer}
Calculated Gestational Age: 17.9
Cigarette smoker (Y/N): NEGATIVE
Donor Age: Egg Retrieval: NEGATIVE
Donor Egg: NEGATIVE
Estriol MoM: 1.21
Estriol, Free: 1.79 ng/mL
Hx. of Neural Tube Defect(s): NEGATIVE
Inhibin A MoM: 2.25
Inhibin A, Dimeric: 358 pg/mL
Insulin-Dependent Diabetic: NEGATIVE
MSS Down Syndrome Risk: 1:141 {titer} — ABNORMAL HIGH
MSS Trisomy 18 Risk: <1:5000 {titer}
Maternal Weight: 130 [lb_av]
Number of Fetuses: 1
Pregnancy, result of IVF: NEGATIVE
Prev Pregnancy Down Syndrome: NEGATIVE
Repeat Specimen: NEGATIVE
Risk for ONTD: <1:5000 {titer}
hCG MoM: 2.03
hCG: 63.1 IU/mL

## 2020-08-15 ENCOUNTER — Inpatient Hospital Stay: Admit: 2020-08-15 | Payer: MEDICARE | Attending: Obstetrics & Gynecology

## 2020-08-15 ENCOUNTER — Inpatient Hospital Stay: Payer: MEDICARE

## 2020-08-15 DIAGNOSIS — Z3483 Encounter for supervision of other normal pregnancy, third trimester: Secondary | ICD-10-CM

## 2020-08-15 LAB — CBC WITH AUTO DIFFERENTIAL
Basophils %: 0.3 % (ref 0–3)
Eosinophils %: 0.1 % (ref 0–5)
Hematocrit: 25.9 % — ABNORMAL LOW (ref 37.0–50.0)
Hemoglobin: 8.6 gm/dl — ABNORMAL LOW (ref 13.0–17.2)
Immature Granulocytes: 0.6 % (ref 0.0–3.0)
Lymphocytes %: 17.5 % — ABNORMAL LOW (ref 28–48)
MCH: 30.2 pg (ref 25.4–34.6)
MCHC: 33.2 gm/dl (ref 30.0–36.0)
MCV: 90.9 fL (ref 80.0–98.0)
MPV: 11.8 fL — ABNORMAL HIGH (ref 6.0–10.0)
Monocytes %: 4.8 % (ref 1–13)
Neutrophils %: 76.7 % — ABNORMAL HIGH (ref 34–64)
Nucleated RBCs: 0 (ref 0–0)
Platelets: 269 10*3/uL (ref 140–450)
RBC: 2.85 M/uL — ABNORMAL LOW (ref 3.60–5.20)
RDW-SD: 49.7 — ABNORMAL HIGH (ref 36.4–46.3)
WBC: 19 10*3/uL — ABNORMAL HIGH (ref 4.0–11.0)

## 2020-08-15 LAB — COMPREHENSIVE METABOLIC PANEL
ALT: 8 U/L — ABNORMAL LOW (ref 10–49)
AST: 14 U/L (ref 0.0–33.9)
Albumin: 3.1 gm/dl — ABNORMAL LOW (ref 3.4–5.0)
Alkaline Phosphatase: 69 U/L (ref 46–116)
Anion Gap: 7 mmol/L (ref 5–15)
BUN: 10 mg/dl (ref 9–23)
CO2: 21 mEq/L (ref 20–31)
Calcium: 9.3 mg/dl (ref 8.7–10.4)
Chloride: 99 mEq/L (ref 98–107)
Creatinine: 1.32 mg/dl — ABNORMAL HIGH (ref 0.55–1.02)
EGFR IF NonAfrican American: 49
GFR African American: 59
Glucose: 307 mg/dl — ABNORMAL HIGH (ref 74–106)
Potassium: 4.3 mEq/L (ref 3.4–4.5)
Sodium: 127 mEq/L — ABNORMAL LOW (ref 136–145)
Total Bilirubin: 0.3 mg/dl (ref 0.30–1.20)
Total Protein: 6.5 gm/dl (ref 5.7–8.2)

## 2020-08-15 LAB — PHOSPHORUS
Phosphorus: 3 mg/dL (ref 2.4–5.1)
Phosphorus: 3 mg/dL (ref 2.4–5.1)

## 2020-08-15 LAB — T4, FREE
FREE T4, FT4T: 1.18 ng/dl (ref 0.89–1.76)
Free T4: 1.18 ng/dl (ref 0.89–1.76)

## 2020-08-15 LAB — MAGNESIUM
Magnesium: 1.5 mg/dL — ABNORMAL LOW (ref 1.6–2.6)
Magnesium: 1.5 mg/dL — ABNORMAL LOW (ref 1.6–2.6)

## 2020-08-15 LAB — TSH 3RD GENERATION
TSH: 1.405 u[IU]/mL (ref 0.550–4.780)
TSH: 1.405 u[IU]/mL (ref 0.550–4.780)

## 2020-08-15 LAB — METABOLIC PANEL, COMPREHENSIVE
ALT (SGPT): 8 U/L — ABNORMAL LOW (ref 10–49)
AST (SGOT): 14 U/L (ref 0.0–33.9)
Albumin: 3.1 gm/dl — ABNORMAL LOW (ref 3.4–5.0)
Alk. phosphatase: 69 U/L (ref 46–116)
Anion gap: 7 mmol/L (ref 5–15)
BUN: 10 mg/dl (ref 9–23)
Bilirubin, total: 0.3 mg/dl (ref 0.30–1.20)
CO2: 21 mEq/L (ref 20–31)
Calcium: 9.3 mg/dl (ref 8.7–10.4)
Chloride: 99 mEq/L (ref 98–107)
Creatinine: 1.32 mg/dl — ABNORMAL HIGH (ref 0.55–1.02)
GFR est AA: 59
GFR est non-AA: 49
Glucose: 307 mg/dl — ABNORMAL HIGH (ref 74–106)
Potassium: 4.3 mEq/L (ref 3.4–4.5)
Protein, total: 6.5 gm/dl (ref 5.7–8.2)
Sodium: 127 mEq/L — ABNORMAL LOW (ref 136–145)

## 2020-08-15 LAB — CBC WITH AUTOMATED DIFF
BASOPHILS: 0.3 % (ref 0–3)
EOSINOPHILS: 0.1 % (ref 0–5)
HCT: 25.9 % — ABNORMAL LOW (ref 37.0–50.0)
HGB: 8.6 gm/dl — ABNORMAL LOW (ref 13.0–17.2)
IMMATURE GRANULOCYTES: 0.6 % (ref 0.0–3.0)
LYMPHOCYTES: 17.5 % — ABNORMAL LOW (ref 28–48)
MCH: 30.2 pg (ref 25.4–34.6)
MCHC: 33.2 gm/dl (ref 30.0–36.0)
MCV: 90.9 fL (ref 80.0–98.0)
MONOCYTES: 4.8 % (ref 1–13)
MPV: 11.8 fL — ABNORMAL HIGH (ref 6.0–10.0)
NEUTROPHILS: 76.7 % — ABNORMAL HIGH (ref 34–64)
NRBC: 0 (ref 0–0)
PLATELET: 269 10*3/uL (ref 140–450)
RBC: 2.85 M/uL — ABNORMAL LOW (ref 3.60–5.20)
RDW-SD: 49.7 — ABNORMAL HIGH (ref 36.4–46.3)
WBC: 19 10*3/uL — ABNORMAL HIGH (ref 4.0–11.0)

## 2020-08-15 MED ORDER — LABETALOL 300 MG TAB
300 mg | Freq: Three times a day (TID) | ORAL | Status: DC
Start: 2020-08-15 — End: 2020-08-16
  Administered 2020-08-15: 23:00:00 via ORAL

## 2020-08-15 MED ORDER — ONDANSETRON (PF) 4 MG/2 ML INJECTION
4 mg/2 mL | INTRAMUSCULAR | Status: DC | PRN
Start: 2020-08-15 — End: 2020-08-16

## 2020-08-15 MED ORDER — INSULIN LISPRO 100 UNIT/ML INJECTION
100 unit/mL | SUBCUTANEOUS | Status: DC | PRN
Start: 2020-08-15 — End: 2020-08-16
  Administered 2020-08-16: 06:00:00 via SUBCUTANEOUS

## 2020-08-15 MED ORDER — NIFEDIPINE ER 30 MG 24 HR TAB
30 mg | Freq: Two times a day (BID) | ORAL | Status: DC
Start: 2020-08-15 — End: 2020-08-16
  Administered 2020-08-15: 23:00:00 via ORAL

## 2020-08-15 MED ORDER — CYANOCOBALAMIN (VITAMIN B-12) 2,500 MCG SUBLINGUAL TAB
2500 mcg | Freq: Every day | SUBLINGUAL | Status: DC
Start: 2020-08-15 — End: 2020-08-16
  Administered 2020-08-16: 13:00:00 via SUBLINGUAL

## 2020-08-15 MED ORDER — MAGNESIUM SULFATE 2 GRAM/50 ML IVPB
2 gram/50 mL (4 %) | Freq: Once | INTRAVENOUS | Status: AC
Start: 2020-08-15 — End: 2020-08-15
  Administered 2020-08-15: 23:00:00 via INTRAVENOUS

## 2020-08-15 MED ORDER — DEXTROSE 50% IN WATER (D50W) IV SYRG
INTRAVENOUS | Status: DC | PRN
Start: 2020-08-15 — End: 2020-08-16
  Administered 2020-08-16: 08:00:00 via INTRAVENOUS

## 2020-08-15 MED ORDER — LABETALOL 300 MG TAB
300 mg | Freq: Three times a day (TID) | ORAL | Status: DC
Start: 2020-08-15 — End: 2020-08-15

## 2020-08-15 MED ORDER — GLUCAGON 1 MG INJECTION
1 mg | INTRAMUSCULAR | Status: DC | PRN
Start: 2020-08-15 — End: 2020-08-16
  Administered 2020-08-16: 08:00:00 via INTRAMUSCULAR

## 2020-08-15 MED ORDER — FERROUS SULFATE 325 MG (65 MG ELEMENTAL IRON) TAB
325 mg (65 mg iron) | Freq: Every day | ORAL | Status: DC
Start: 2020-08-15 — End: 2020-08-16
  Administered 2020-08-16: 12:00:00 via ORAL

## 2020-08-15 MED ORDER — INSULIN LISPRO 100 UNIT/ML INJECTION
100 unit/mL | Freq: Four times a day (QID) | SUBCUTANEOUS | Status: DC
Start: 2020-08-15 — End: 2020-08-16
  Administered 2020-08-16: 04:00:00 via SUBCUTANEOUS

## 2020-08-15 MED ORDER — LABETALOL 5 MG/ML IV SOLN
5 mg/mL | Freq: Four times a day (QID) | INTRAVENOUS | Status: DC | PRN
Start: 2020-08-15 — End: 2020-08-16

## 2020-08-15 MED ORDER — INSULIN GLARGINE 100 UNIT/ML INJECTION
100 unit/mL | Freq: Every evening | SUBCUTANEOUS | Status: DC
Start: 2020-08-15 — End: 2020-08-16
  Administered 2020-08-16: 02:00:00 via SUBCUTANEOUS

## 2020-08-15 MED FILL — DEXTROSE 50% IN WATER (D50W) IV SYRG: INTRAVENOUS | Qty: 50

## 2020-08-15 MED FILL — INSULIN LISPRO 100 UNIT/ML INJECTION: 100 unit/mL | SUBCUTANEOUS | Qty: 1

## 2020-08-15 MED FILL — NIFEDIPINE ER 30 MG 24 HR TAB: 30 mg | ORAL | Qty: 3

## 2020-08-15 MED FILL — LABETALOL 300 MG TAB: 300 mg | ORAL | Qty: 3

## 2020-08-15 MED FILL — MAGNESIUM SULFATE 2 GRAM/50 ML IVPB: 2 gram/50 mL (4 %) | INTRAVENOUS | Qty: 50

## 2020-08-15 NOTE — Consults (Signed)
Consult Note    Patient: [name] [name]               Sex: female          DOA: 08/15/2020         Date of Birth:  [birth year]      Age:  35 y.o.        LOS:  LOS: 0 days              Consulting Physician: Janice Coffin, MD    Reason for Consult: Medical Management    HPI:     [name] [name] is a 35 y.o. female who has been seen for   Medical management of uncontrolled hypertension and  Type 1 diabetes.  Patient seen and examined patient was feeling fine denies any chest pain shortness of breath any headache.  She was seen for regular follow-up and her blood pressure was extremely high.  No nausea vomiting, no vaginal bleeding  Review of all other systems were negative.  12+ review of systems were done  Past Medical History:   Diagnosis Date    Cyclical vomiting 07/09/2017    Diabetes (HCC)     Diabetic ketoacidosis without coma associated with type 1 diabetes mellitus (HCC) 11/05/2015    Drug-seeking behavior     Essential hypertension 03/27/2016    Amlodipine, carvedilol, lisinopril.    Gastroparesis     Gastroparesis     Hypertension     Insulin long-term use (HCC) 11/20/2016    Slow transit constipation 08/08/2017    Type 1 diabetes mellitus (HCC) 02/17/2009    Diagnosed at age 82. Had insulin pump at one time, but developed keloids at needle insertion sites. Was supposed to establish with [facility].       Prior to Admission medications    Medication Sig Start Date End Date Taking? Authorizing Provider   promethazine (PHENERGAN) 12.5 mg tablet Take 1 Tab by mouth every six (6) hours as needed for Nausea. 03/22/18   Peoples, [place], [place]   lisinopril (PRINIVIL, ZESTRIL) 20 mg tablet Take 1 Tab by mouth daily. 02/27/18   Simoes, Reina Fuse, MD   amitriptyline (ELAVIL) 25 mg tablet Take 1 Tab by mouth nightly. 02/27/18   Simoes, Ruchita S, MD   cloNIDine (CATAPRES) 0.1 mg/24 hr ptwk 1 Patch by TransDERmal route every seven (7) days. 03/05/18   Simoes, Reina Fuse, MD   lansoprazole (PREVACID SOLUTAB) 30 mg disintegrating  tablet Take 1 Tab by mouth Daily (before breakfast). 02/28/18   Simoes, Reina Fuse, MD   promethazine (PHENERGAN) 12.5 mg tablet Take 1 Tab by mouth every six (6) hours as needed for Nausea. 02/27/18   Simoes, Ruchita S, MD   insulin aspart U-100 (NOVOLOG FLEXPEN U-100 INSULIN) 100 unit/mL (3 mL) inpn Before breakfast, lunch, dinner and at bedtime. Inject subcutaneously per sliding scale 3 times a day.    Other, Phys, MD   insulin glargine (LANTUS,BASAGLAR) 100 unit/mL (3 mL) inpn 28 Units by SubCUTAneous route every evening.    Other, Phys, MD       Allergies   Allergen Reactions    Morphine Itching     Pt denies allergy    Robaxin [Methocarbamol] Other (comments)     Body shaking, nausea    Penicillins Hives    Zithromax [Azithromycin] Hives       Past Surgical History:   Procedure Laterality Date    HX CESAREAN SECTION  PR ABDOMEN SURGERY PROC UNLISTED  05/2015    Gastric stimulator pump        Family History   Problem Relation Age of Onset    Diabetes Father     Stroke Other         great grandmother       Social History     Socioeconomic History    Marital status: SINGLE   Tobacco Use    Smoking status: Never    Smokeless tobacco: Never   Substance and Sexual Activity    Alcohol use: No    Drug use: Yes     Types: Marijuana     Comment: last use 02/02/2018    Sexual activity: Yes     Birth control/protection: Injection         Subjective:   No new complaints    Review of Systems:    CONSTITUTIONAL: No Fever, No chills, No weight loss, No Night sweats  HEENT: No nasal discharge, No PN drainage, No epistaxis, No diff in swallowing  CVS: No chest pain, No palpitations, No syncope, No peripheral edema,   RS: No shortness of breath, No cough, No hemoptysis, No pleuritic chest pain  GI: No abd pain, No Nausea, No vomitting, No diarrhea, No rectal bleeding, No acid reflux or heartburn  NEURO: No focal weakness, No headaches, No seizures  PSYCH: No anxiety, No depression  MUSCULOSKLETAL: No joint pain or swelling  GU:  No hematuria or dysuria  SKIN: No rash  SLEEP: No snoring, No witnessed apneas, No daytime somnolence or tiredness    Objective:   Visit Vitals  BP (!) 185/88   Pulse 82       Physical Exam:  General appearance: Very pleasant young female average built in no acute distress  Head: Normocephalic, without obvious abnormality, atraumatic  Eyes: Conjunctiva pink, PERLA, EOMI  Neck: supple, no thyromegaly, No JVD, no bruit  Lungs: Clear to auscultation  Heart: Regular rhythm, no murmur  Abdomen: soft, distended, nontender bowel sounds audible  Extremities:No edema, distal pulses palpable  Skin: Skin color, texture, turgor normal.   Neurological :Awake and alert  Answering questions appropriately  Intake and Output:  Current Shift:  No intake/output data recorded.  Last three shifts:  No intake/output data recorded.    Lab/Data Reviewed:  No results found for this or any previous visit (from the past 48 hour(s)).    Imaging:  No results found.    Medications Reviewed:  No current facility-administered medications for this encounter.         Assessments:  1.  Hypertensive urgency  2.  DM type I uncontrolled due to hyperglycemia  3.  Anemia due to iron and B12 deficiency  4.  High risk pregnancy  Recommendations:  1.  Patient was seen and examined.  Since patient is not very symptomatic.  Patient will be started on labetalol 900 mg p.o. 3 times a day first dose now and Procardia 90 mg twice daily first dose now.  If systolic blood pressure over 180 patient will be given IV labetalol.  For anemia patient was started on iron and B12 supplement.  I will check CBC complete metabolic profile magnesium phosphorus, thyroid function and urinalysis.  For glycemic control patient was started on Glucomander protocol.  Patient's previous medical record was reviewed in detail.  I want to thank Dr. Mayford Knife for allowing to participate in this patient's medical management will follow with you      Dragon medical  dictation software was used  for portions of this report. Unintended errors may occur.     Hazel Sams, MD  P# [phone]

## 2020-08-16 LAB — COMPREHENSIVE METABOLIC PANEL
ALT: <7 U/L — ABNORMAL LOW (ref 10–49)
AST: 11 U/L (ref 0.0–33.9)
Albumin: 3 gm/dl — ABNORMAL LOW (ref 3.4–5.0)
Alkaline Phosphatase: 66 U/L (ref 46–116)
Anion Gap: 4 mmol/L — ABNORMAL LOW (ref 5–15)
BUN: 8 mg/dl — ABNORMAL LOW (ref 9–23)
CO2: 21 mEq/L (ref 20–31)
Calcium: 8.9 mg/dl (ref 8.7–10.4)
Chloride: 110 mEq/L — ABNORMAL HIGH (ref 98–107)
Creatinine: 0.99 mg/dl (ref 0.55–1.02)
EGFR IF NonAfrican American: >60
GFR African American: >60
Glucose: 20 mg/dl — CL (ref 74–106)
Potassium: 3.4 mEq/L (ref 3.4–4.5)
Sodium: 135 mEq/L — ABNORMAL LOW (ref 136–145)
Total Bilirubin: 0.4 mg/dl (ref 0.30–1.20)
Total Protein: 6.4 gm/dl (ref 5.7–8.2)

## 2020-08-16 LAB — POCT GLUCOSE
POC Glucose: 205 mg/dL — ABNORMAL HIGH (ref 65–105)
POC Glucose: 314 mg/dL — ABNORMAL HIGH (ref 65–105)
POC Glucose: 251 mg/dL — ABNORMAL HIGH (ref 65–105)
POC Glucose: 27 mg/dL — CL (ref 65–105)
POC Glucose: 176 mg/dL — ABNORMAL HIGH (ref 65–105)
POC Glucose: 127 mg/dL — ABNORMAL HIGH (ref 65–105)

## 2020-08-16 LAB — CBC WITH AUTO DIFFERENTIAL
Basophils %: 0.3 % (ref 0–3)
Eosinophils %: 2.4 % (ref 0–5)
Hematocrit: 26.9 % — ABNORMAL LOW (ref 37.0–50.0)
Hemoglobin: 9.1 gm/dl — ABNORMAL LOW (ref 13.0–17.2)
Immature Granulocytes: 0.6 % (ref 0.0–3.0)
Lymphocytes %: 24 % — ABNORMAL LOW (ref 28–48)
MCH: 30.3 pg (ref 25.4–34.6)
MCHC: 33.8 gm/dl (ref 30.0–36.0)
MCV: 89.7 fL (ref 80.0–98.0)
MPV: 11.6 fL — ABNORMAL HIGH (ref 6.0–10.0)
Monocytes %: 6 % (ref 1–13)
Neutrophils %: 66.7 % — ABNORMAL HIGH (ref 34–64)
Nucleated RBCs: 0 (ref 0–0)
Platelets: 295 10*3/uL (ref 140–450)
RBC: 3 M/uL — ABNORMAL LOW (ref 3.60–5.20)
RDW-SD: 49.1 — ABNORMAL HIGH (ref 36.4–46.3)
WBC: 20.2 10*3/uL — ABNORMAL HIGH (ref 4.0–11.0)

## 2020-08-16 LAB — URINALYSIS W/ RFLX MICROSCOPIC
Bilirubin, Urine: NEGATIVE
Bilirubin: NEGATIVE
Blood, Urine: NEGATIVE
Blood: NEGATIVE
Glucose, Ur: 500 mg/dl — AB
Glucose: 500 mg/dl — AB
Ketone: NEGATIVE mg/dl
Ketones, Urine: NEGATIVE mg/dl
Leukocyte Esterase, Urine: NEGATIVE
Leukocyte Esterase: NEGATIVE
Nitrite, Urine: NEGATIVE
Nitrites: NEGATIVE
Protein, UA: 30 mg/dl — AB
Protein: 30 mg/dl — AB
Specific Gravity, UA: 1.025 (ref 1.005–1.030)
Specific gravity: 1.025 (ref 1.005–1.030)
Urobilinogen, UA, POCT: 0.2 mg/dl (ref 0.0–1.0)
Urobilinogen: 0.2 mg/dl (ref 0.0–1.0)
pH (UA): 7 (ref 5.0–9.0)
pH, UA: 7 (ref 5.0–9.0)

## 2020-08-16 LAB — ANTIBODY SCREEN
Antibody Screen: NEGATIVE
Antibody screen: NEGATIVE

## 2020-08-16 LAB — POC URINE MICROSCOPIC

## 2020-08-16 LAB — HEMOGLOBIN A1C
Estimated Avg Glucose, External: 173 mg/dL — ABNORMAL HIGH (ref 91–123)
Hemoglobin A1C, External: 7.7 % — ABNORMAL HIGH (ref 4.8–5.6)

## 2020-08-16 LAB — MAGNESIUM
Magnesium: 1.7 mg/dL (ref 1.6–2.6)
Magnesium: 1.7 mg/dL (ref 1.6–2.6)

## 2020-08-16 LAB — BLOOD TYPE, (ABO+RH)
ABO/Rh(D): O POS
ABO/Rh: O POS

## 2020-08-16 LAB — PHOSPHORUS
Phosphorus: 2.1 mg/dL — ABNORMAL LOW (ref 2.4–5.1)
Phosphorus: 2.1 mg/dL — ABNORMAL LOW (ref 2.4–5.1)

## 2020-08-16 LAB — CBC WITH AUTOMATED DIFF
BASOPHILS: 0.3 % (ref 0–3)
EOSINOPHILS: 2.4 % (ref 0–5)
HCT: 26.9 % — ABNORMAL LOW (ref 37.0–50.0)
HGB: 9.1 gm/dl — ABNORMAL LOW (ref 13.0–17.2)
IMMATURE GRANULOCYTES: 0.6 % (ref 0.0–3.0)
LYMPHOCYTES: 24 % — ABNORMAL LOW (ref 28–48)
MCH: 30.3 pg (ref 25.4–34.6)
MCHC: 33.8 gm/dl (ref 30.0–36.0)
MCV: 89.7 fL (ref 80.0–98.0)
MONOCYTES: 6 % (ref 1–13)
MPV: 11.6 fL — ABNORMAL HIGH (ref 6.0–10.0)
NEUTROPHILS: 66.7 % — ABNORMAL HIGH (ref 34–64)
NRBC: 0 (ref 0–0)
PLATELET: 295 10*3/uL (ref 140–450)
RBC: 3 M/uL — ABNORMAL LOW (ref 3.60–5.20)
RDW-SD: 49.1 — ABNORMAL HIGH (ref 36.4–46.3)
WBC: 20.2 10*3/uL — ABNORMAL HIGH (ref 4.0–11.0)

## 2020-08-16 LAB — GLUCOSE, POC
Glucose (POC): 205 mg/dL — ABNORMAL HIGH (ref 65–105)
Glucose (POC): 314 mg/dL — ABNORMAL HIGH (ref 65–105)
Glucose (POC): 251 mg/dL — ABNORMAL HIGH (ref 65–105)
Glucose (POC): 27 mg/dL — CL (ref 65–105)
Glucose (POC): 176 mg/dL — ABNORMAL HIGH (ref 65–105)
Glucose (POC): 127 mg/dL — ABNORMAL HIGH (ref 65–105)

## 2020-08-16 LAB — METABOLIC PANEL, COMPREHENSIVE
ALT (SGPT): <7 U/L — ABNORMAL LOW (ref 10–49)
AST (SGOT): 11 U/L (ref 0.0–33.9)
Albumin: 3 gm/dl — ABNORMAL LOW (ref 3.4–5.0)
Alk. phosphatase: 66 U/L (ref 46–116)
Anion gap: 4 mmol/L — ABNORMAL LOW (ref 5–15)
BUN: 8 mg/dl — ABNORMAL LOW (ref 9–23)
Bilirubin, total: 0.4 mg/dl (ref 0.30–1.20)
CO2: 21 mEq/L (ref 20–31)
Calcium: 8.9 mg/dl (ref 8.7–10.4)
Chloride: 110 mEq/L — ABNORMAL HIGH (ref 98–107)
Creatinine: 0.99 mg/dl (ref 0.55–1.02)
GFR est AA: >60
GFR est non-AA: >60
Glucose: 20 mg/dl — CL (ref 74–106)
Potassium: 3.4 mEq/L (ref 3.4–4.5)
Protein, total: 6.4 gm/dl (ref 5.7–8.2)
Sodium: 135 mEq/L — ABNORMAL LOW (ref 136–145)

## 2020-08-16 MED ORDER — LABETALOL 300 MG TAB
300 mg | Freq: Two times a day (BID) | ORAL | Status: DC
Start: 2020-08-16 — End: 2020-08-16

## 2020-08-16 MED ORDER — LABETALOL 300 MG TAB
300 mg | Freq: Three times a day (TID) | ORAL | Status: DC
Start: 2020-08-16 — End: 2020-08-16
  Administered 2020-08-16: 12:00:00 via ORAL

## 2020-08-16 MED ORDER — POTASSIUM & SODIUM PHOSPHATES 280 MG-160 MG-250 MG ORAL POWDER PACKET
280-160-250 mg | Freq: Two times a day (BID) | ORAL | Status: DC
Start: 2020-08-16 — End: 2020-08-16
  Administered 2020-08-16: 13:00:00 via ORAL

## 2020-08-16 MED ORDER — NIFEDIPINE ER 30 MG 24 HR TAB
30 mg | Freq: Two times a day (BID) | ORAL | Status: DC
Start: 2020-08-16 — End: 2020-08-16
  Administered 2020-08-16: 13:00:00 via ORAL

## 2020-08-16 MED FILL — VITAMIN B-12  2,500 MCG SUBLINGUAL TABLET: 2500 mcg | SUBLINGUAL | Qty: 1

## 2020-08-16 MED FILL — SEMGLEE U-100 INSULIN 100 UNIT/ML SUBCUTANEOUS SOLUTION: 100 unit/mL | SUBCUTANEOUS | Qty: 1

## 2020-08-16 MED FILL — GLUCAGEN DIAGNOSTIC KIT 1 MG/ML INJECTION: 1 mg/mL | INTRAMUSCULAR | Qty: 1

## 2020-08-16 MED FILL — NIFEDIPINE ER 30 MG 24 HR TAB: 30 mg | ORAL | Qty: 1

## 2020-08-16 MED FILL — PHOSPHOROUS SUPPLEMENT 280 MG-160 MG-250 MG ORAL POWDER PACKET: 280-160-250 mg | ORAL | Qty: 1

## 2020-08-16 MED FILL — LABETALOL 300 MG TAB: 300 mg | ORAL | Qty: 2

## 2020-08-16 MED FILL — FEROSUL 325 MG (65 MG IRON) TABLET: 325 mg (65 mg iron) | ORAL | Qty: 1

## 2020-08-16 NOTE — Progress Notes (Signed)
Progress Note    Patient: [name] [name]   Age:  35 y.o.  DOA: 08/15/2020   Admit Dx / CC: [redacted]w[redacted]d LOS:  LOS: 0 days     Active Problems   1.  Hypertensive urgency  2.  DM type I uncontrolled due to hyperglycemia and hypoglycemia with diabetic nephropathy  3.  Anemia due to iron and B12 deficiency  4.  High risk pregnancy  5.  Hypomagnesemia  6.  Hyponatremia  7.  Hypophosphatemia  8.  Leukocytosis likely reactive  Plan:   1.  Patient was seen and examined patient was feeling little better  Her blood pressure under good control.  We will recommend decreasing labetalol 600 mg twice daily and Procardia 30 mg twice daily.  Patient was advised to take her medicine as directed and check her blood pressure twice a day and keep a record  2.  For anemia patient was started on ferrous sulfate and B12 supplement  3.  For hypomagnesemia patient was given magnesium sulfate 2 g IV one-time dose repeat magnesium level has improved  4.  For hypophosphatemia patient was started on K-Phos 1 packet twice a day for couple days  5.  I will repeat CBC, complete metabolic profile magnesium and phosphorus in a.m.    Subjective:   Feeling much better according to patient she needs refill on her antihypertensive medication denies any chest pain shortness of breath no nausea vomiting no urinary complaints    Objective:   Visit Vitals  BP (!) 112/54   Pulse 82   Resp 16   Ht 5' 6" (1.676 m)   Wt 59 kg (130 lb)   SpO2 100%   BMI 20.98 kg/m??       Physical Exam:  General appearance: Young female small built lying comfortably no acute distress  Head: Conjunctive are pale  Lungs: CTA bilaterally  Heart: Regular rhythm, no murmur  Abdomen: soft, distended, non-tender. Bowel sounds Audible,  Extremities: No edema, distal pulses palpable  Skin: Warm and dry  Neurologic: Awake and alert  Answering questions appropriately  Intake and Output:  Current Shift:  No intake/output data recorded.  Last three shifts:  No intake/output data recorded.    Lab/Data  Reviewed:  Recent Results (from the past 48 hour(s))   CBC WITH AUTOMATED DIFF    Collection Time: 08/15/20  6:00 PM   Result Value Ref Range    WBC 19.0 (H) 4.0 - 11.0 1000/mm3    RBC 2.85 (L) 3.60 - 5.20 M/uL    HGB 8.6 (L) 13.0 - 17.2 gm/dl    HCT 25.9 (L) 37.0 - 50.0 %    MCV 90.9 80.0 - 98.0 fL    MCH 30.2 25.4 - 34.6 pg    MCHC 33.2 30.0 - 36.0 gm/dl    PLATELET 269 140 - 450 1000/mm3    MPV 11.8 (H) 6.0 - 10.0 fL    RDW-SD 49.7 (H) 36.4 - 46.3      NRBC 0 0 - 0      IMMATURE GRANULOCYTES 0.6 0.0 - 3.0 %    NEUTROPHILS 76.7 (H) 34 - 64 %    LYMPHOCYTES 17.5 (L) 28 - 48 %    MONOCYTES 4.8 1 - 13 %    EOSINOPHILS 0.1 0 - 5 %    BASOPHILS 0.3 0 - 3 %   BLOOD TYPE, (ABO+RH)    Collection Time: 08/15/20  6:00 PM   Result Value Ref Range  ABO/Rh(D) O Rh Positive     ANTIBODY SCREEN    Collection Time: 08/15/20  6:00 PM   Result Value Ref Range    Antibody screen NEG     METABOLIC PANEL, COMPREHENSIVE    Collection Time: 08/15/20  6:00 PM   Result Value Ref Range    Potassium 4.3 3.4 - 4.5 mEq/L    Chloride 99 98 - 107 mEq/L    Sodium 127 (L) 136 - 145 mEq/L    CO2 21 20 - 31 mEq/L    Glucose 307 (H) 74 - 106 mg/dl    BUN 10 9 - 23 mg/dl    Creatinine 1.32 (H) 0.55 - 1.02 mg/dl    GFR est AA 59.0      GFR est non-AA 49      Calcium 9.3 8.7 - 10.4 mg/dl    Anion gap 7 5 - 15 mmol/L    AST (SGOT) 14.0 0.0 - 33.9 U/L    ALT (SGPT) 8 (L) 10 - 49 U/L    Alk. phosphatase 69 46 - 116 U/L    Bilirubin, total 0.30 0.30 - 1.20 mg/dl    Protein, total 6.5 5.7 - 8.2 gm/dl    Albumin 3.1 (L) 3.4 - 5.0 gm/dl   MAGNESIUM    Collection Time: 08/15/20  6:00 PM   Result Value Ref Range    Magnesium 1.5 (L) 1.6 - 2.6 mg/dL   PHOSPHORUS    Collection Time: 08/15/20  6:00 PM   Result Value Ref Range    Phosphorus 3.0 2.4 - 5.1 mg/dL   TSH 3RD GENERATION    Collection Time: 08/15/20  6:00 PM   Result Value Ref Range    TSH 1.405 0.550 - 4.780 uIU/mL   T4, FREE    Collection Time: 08/15/20  6:00 PM   Result Value Ref Range    Free T4  1.18 0.89 - 1.76 ng/dl   GLUCOSE, POC    Collection Time: 08/15/20  9:23 PM   Result Value Ref Range    Glucose (POC) 205 (H) 65 - 105 mg/dL   URINALYSIS W/ RFLX MICROSCOPIC    Collection Time: 08/15/20 10:00 PM   Result Value Ref Range    Color YELLOW YELLOW,STRAW      Appearance CLEAR CLEAR      Glucose 500 (A) NEGATIVE,Negative mg/dl    Bilirubin NEGATIVE NEGATIVE,Negative      Ketone NEGATIVE NEGATIVE,Negative mg/dl    Specific gravity 1.025 1.005 - 1.030      Blood NEGATIVE NEGATIVE,Negative      pH (UA) 7.0 5.0 - 9.0      Protein 30 (A) NEGATIVE,Negative mg/dl    Urobilinogen 0.2 0.0 - 1.0 mg/dl    Nitrites NEGATIVE NEGATIVE,Negative      Leukocyte Esterase NEGATIVE NEGATIVE,Negative     POC URINE MICROSCOPIC    Collection Time: 08/15/20 10:00 PM   Result Value Ref Range    Epithelial cells, squamous OCCASIONAL /LPF   GLUCOSE, POC    Collection Time: 08/16/20  1:21 AM   Result Value Ref Range    Glucose (POC) 314 (H) 65 - 105 mg/dL   GLUCOSE, POC    Collection Time: 08/16/20  3:39 AM   Result Value Ref Range    Glucose (POC) 27 (LL) 65 - 105 mg/dL   CBC WITH AUTOMATED DIFF    Collection Time: 08/16/20  3:50 AM   Result Value Ref Range    WBC 20.2 (H) 4.0 -  11.0 1000/mm3    RBC 3.00 (L) 3.60 - 5.20 M/uL    HGB 9.1 (L) 13.0 - 17.2 gm/dl    HCT 26.9 (L) 37.0 - 50.0 %    MCV 89.7 80.0 - 98.0 fL    MCH 30.3 25.4 - 34.6 pg    MCHC 33.8 30.0 - 36.0 gm/dl    PLATELET 295 140 - 450 1000/mm3    MPV 11.6 (H) 6.0 - 10.0 fL    RDW-SD 49.1 (H) 36.4 - 46.3      NRBC 0 0 - 0      IMMATURE GRANULOCYTES 0.6 0.0 - 3.0 %    NEUTROPHILS 66.7 (H) 34 - 64 %    LYMPHOCYTES 24.0 (L) 28 - 48 %    MONOCYTES 6.0 1 - 13 %    EOSINOPHILS 2.4 0 - 5 %    BASOPHILS 0.3 0 - 3 %   METABOLIC PANEL, COMPREHENSIVE    Collection Time: 08/16/20  3:50 AM   Result Value Ref Range    Potassium 3.4 3.4 - 4.5 mEq/L    Chloride 110 (H) 98 - 107 mEq/L    Sodium 135 (L) 136 - 145 mEq/L    CO2 21 20 - 31 mEq/L    Glucose 20 (LL) 74 - 106 mg/dl    BUN 8 (L) 9  - 23 mg/dl    Creatinine 0.99 0.55 - 1.02 mg/dl    GFR est AA >60.0      GFR est non-AA >60      Calcium 8.9 8.7 - 10.4 mg/dl    Anion gap 4 (L) 5 - 15 mmol/L    AST (SGOT) 11.0 0.0 - 33.9 U/L    ALT (SGPT) <7 (L) 10 - 49 U/L    Alk. phosphatase 66 46 - 116 U/L    Bilirubin, total 0.40 0.30 - 1.20 mg/dl    Protein, total 6.4 5.7 - 8.2 gm/dl    Albumin 3.0 (L) 3.4 - 5.0 gm/dl   MAGNESIUM    Collection Time: 08/16/20  3:50 AM   Result Value Ref Range    Magnesium 1.7 1.6 - 2.6 mg/dL   PHOSPHORUS    Collection Time: 08/16/20  3:50 AM   Result Value Ref Range    Phosphorus 2.1 (L) 2.4 - 5.1 mg/dL   GLUCOSE, POC    Collection Time: 08/16/20  4:03 AM   Result Value Ref Range    Glucose (POC) 251 (H) 65 - 105 mg/dL   GLUCOSE, POC    Collection Time: 08/16/20  6:22 AM   Result Value Ref Range    Glucose (POC) 176 (H) 65 - 105 mg/dL   GLUCOSE, POC    Collection Time: 08/16/20 11:34 AM   Result Value Ref Range    Glucose (POC) 127 (H) 65 - 105 mg/dL       Imaging:  [place] [employer]    Result Date: 08/16/2020  Indication: Cervix shortening. Pregnancy.     IMPRESSION: Living single intrauterine fetus. 4 mm cervix dilated to 1 cm. Comment: Limited sonography of the gravid uterus was performed. There is a single living intrauterine fetus in cephalic lie. The placenta is posterior marginal grade 1. The tip is 5 mm from the internal os. The fetal heart rate is 147 bpm. The AFI equals 17.1 cm with single deepest pocket 4.8 cm. The cervix measures 4 mm and is dilated to 1 cm. The scanning sonographer discussed this with Dr. Jimmye Norman. The procedure  was stopped and patient was taken to 3 E.      Medications Reviewed:  Current Facility-Administered Medications   Medication Dose Route Frequency    labetaloL (NORMODYNE) tablet 600 mg  600 mg Oral TID    potassium, sodium phosphates (NEUTRA-PHOS) packet 1 Packet  1 Packet Oral BID    NIFEdipine ER (PROCARDIA XL) tablet 30 mg  30 mg Oral BID    labetaloL (NORMODYNE;TRANDATE) injection 10 mg   10 mg IntraVENous Q6H PRN    dextrose (D50W) injection syrg 5-25 g  10-50 mL IntraVENous PRN    glucagon (GLUCAGEN) injection 1 mg  1 mg IntraMUSCular PRN    insulin glargine (LANTUS) injection 1-100 Units  1-100 Units SubCUTAneous QHS    insulin lispro (HUMALOG) injection 1-100 Units  1-100 Units SubCUTAneous AC&HS    insulin lispro (HUMALOG) injection 1-100 Units  1-100 Units SubCUTAneous PRN    ondansetron (ZOFRAN) injection 4 mg  4 mg IntraVENous Q4H PRN    ferrous sulfate tablet 325 mg  1 Tablet Oral DAILY WITH BREAKFAST    cyanocobalamin (VITAMIN B12) sublingual tablet 2,500 mcg  2,500 mcg SubLINGual DAILY         Dragon medical dictation software was used for portions of this report. Unintended errors may occur.   Elon Spanner, MD  P# [phone]  August 16, 2020

## 2020-08-16 NOTE — Progress Notes (Signed)
Patient called out at 0330 and stated that she felt like her sugar was dropping.  BS obtained and found to be 27.  D50 obtained however the patient stated that her IV had come out.  IM glucagen given, IV started and D50 given.  Patient stated that she was feeling better.  BS obtained and found to be 251.  Will continue to monitor.

## 2020-08-16 NOTE — Progress Notes (Signed)
Critical Result Notification    Received and verbally repeated the following test results Glucose, 20  from Lab (NAME OF TECHNICIAN) on 08/16/20 (DATE), at 0448 (TIME).     Dr Mayford Knife (NAME OF PROVIDER) was notified and provided a verbal readback of the results listed above on 08/16/20 (DATE) at 0509(TIME). Orders were not received at this time.    Additional comments:Dr Mathison covering at the moment, 2 pages and a cell phone call were made with no response.  Dr Mayford Knife called and notified of BS, glucose, and response to low sugar.    Melvern Sample, RN

## 2020-08-16 NOTE — Progress Notes (Signed)
7858: FHR 143   1310: Patient left unit via Transport.

## 2020-09-27 LAB — HEMOGLOBIN A1C
Estimated Avg Glucose, External: 150 mg/dL — ABNORMAL HIGH (ref 91–123)
Hemoglobin A1C, External: 6.9 % — ABNORMAL HIGH (ref 4.8–5.6)

## 2021-09-07 LAB — HEMOGLOBIN A1C
Estimated Avg Glucose, External: 219 mg/dL — ABNORMAL HIGH (ref 91–123)
Hemoglobin A1C, External: 9.3 % — ABNORMAL HIGH (ref 4.8–5.6)

## 2021-12-09 IMAGING — CT CT ABD-PELV W/ CM
2 of 4 series · 16 of 46 positions shown, 18 images · IV contrast (omnipaque)
Comparison: None.

CLINICAL DATA: Abdominal pain, nausea, vomiting and headache.
Hypertension. History of gastroparesis.

EXAM:
CT ABDOMEN AND PELVIS WITH CONTRAST
TECHNIQUE: Multidetector CT imaging of the abdomen and pelvis was performed
using the standard protocol following bolus administration of
intravenous contrast.
CONTRAST:  100 mL OMNIPAQUE IOHEXOL 300 MG/ML  SOLN

[Series 3: a/p w/ 5mm · axial · 0.78mm/px · z∈[+790,+1220]mm · 13 of 94 slices shown, 15 images]
[im 4/94  soft-tissue]
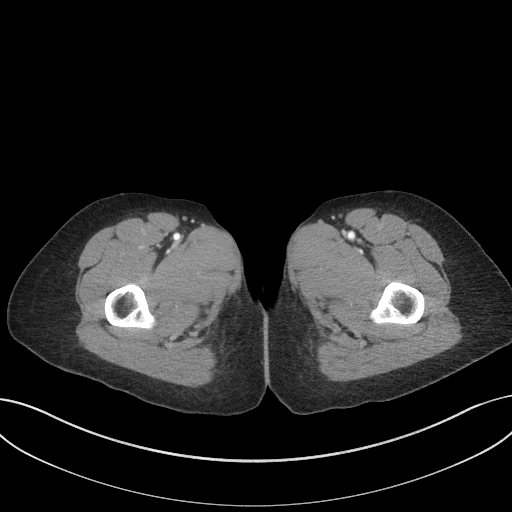
[im 4/94  bone]
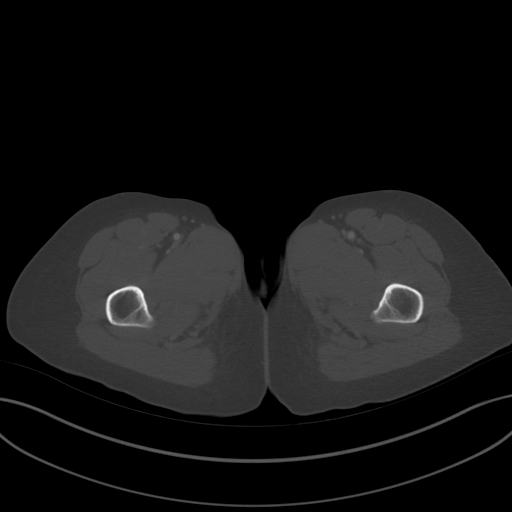
[im 12/94  soft-tissue]
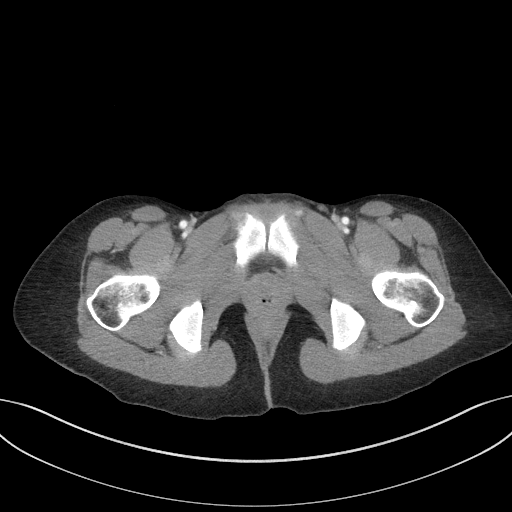
[im 20/94  soft-tissue]
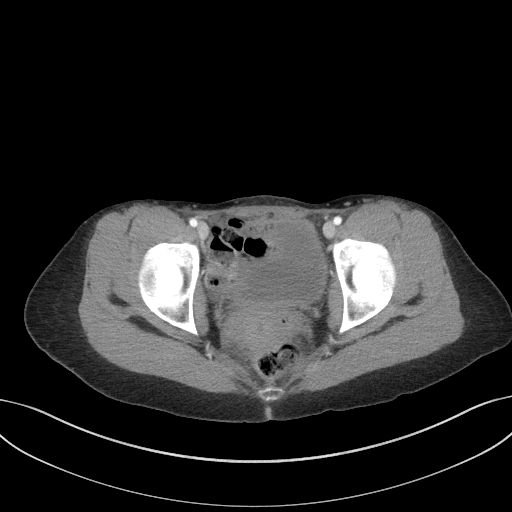
[im 28/94  soft-tissue]
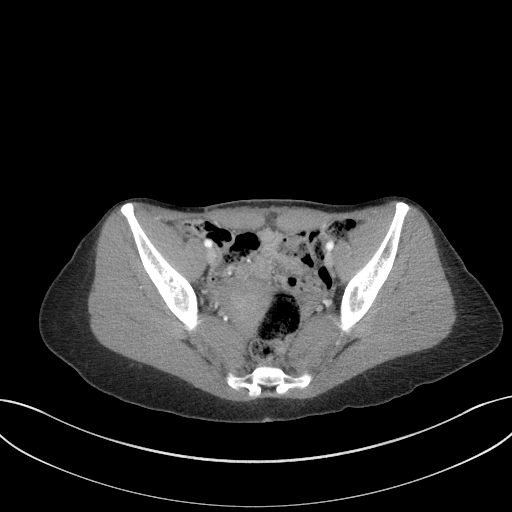
[im 32/94  soft-tissue]
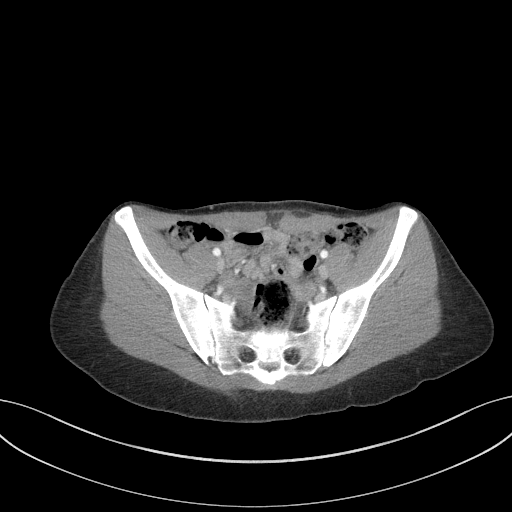
[im 39/94  soft-tissue]
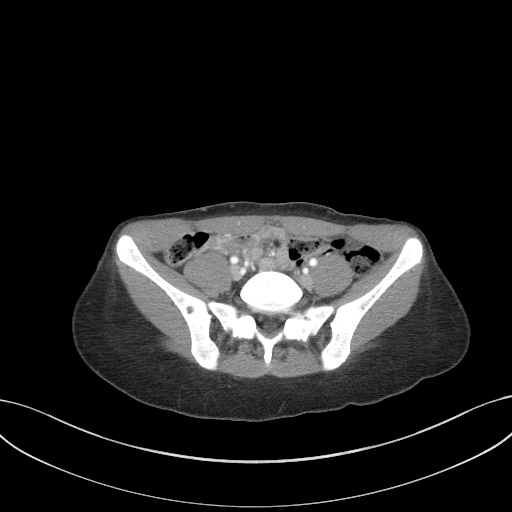
[im 47/94  soft-tissue]
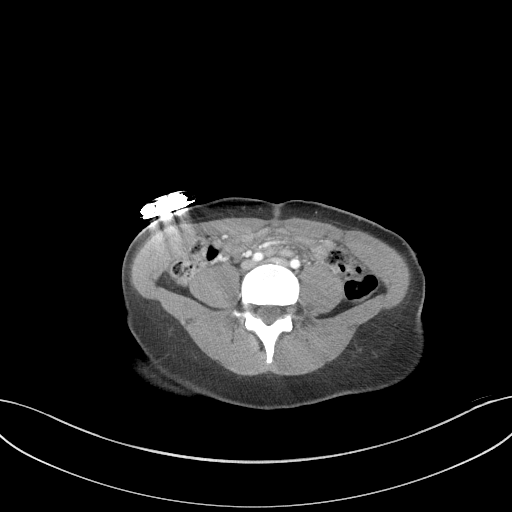
[im 55/94  soft-tissue]
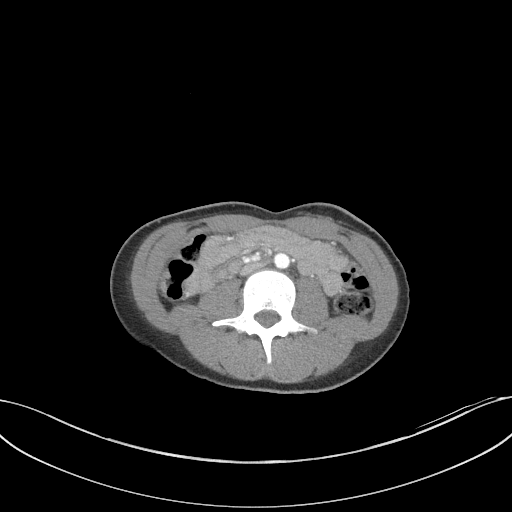
[im 63/94  soft-tissue]
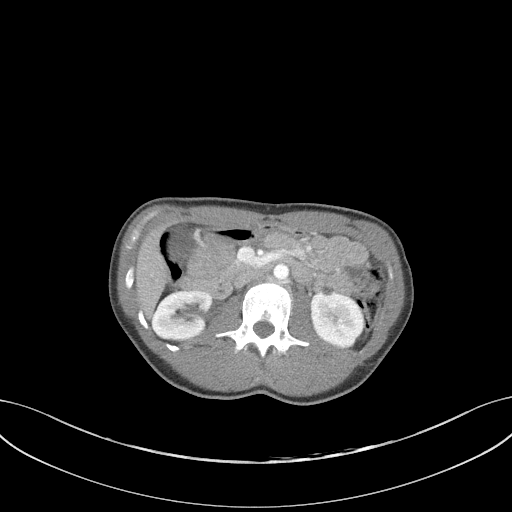
[im 63/94  bone]
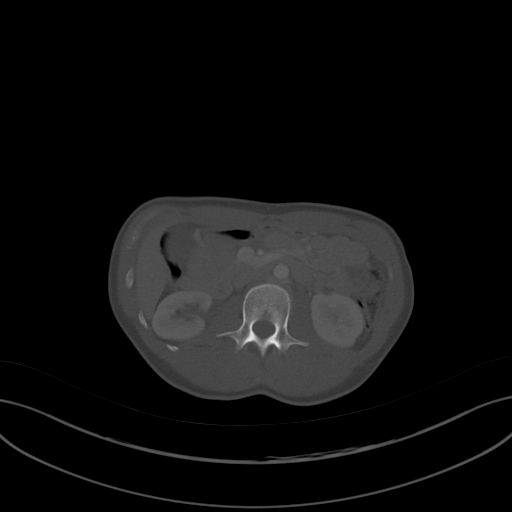
[im 66/94  soft-tissue]
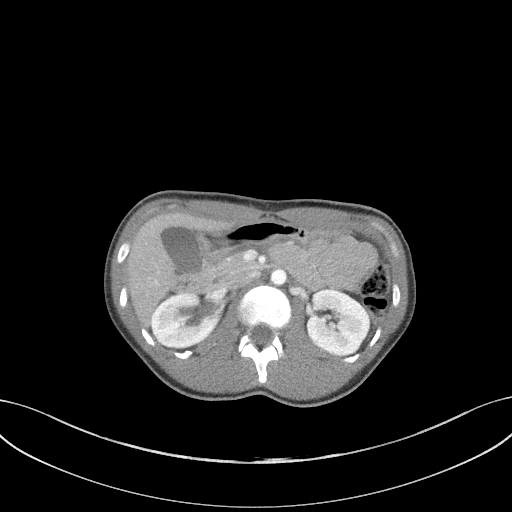
[im 74/94  soft-tissue]
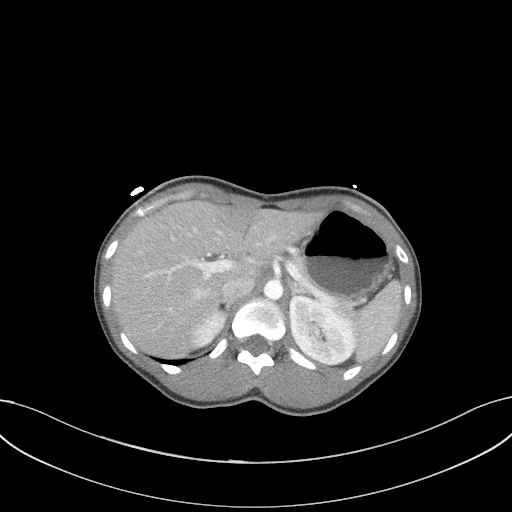
[im 82/94  soft-tissue]
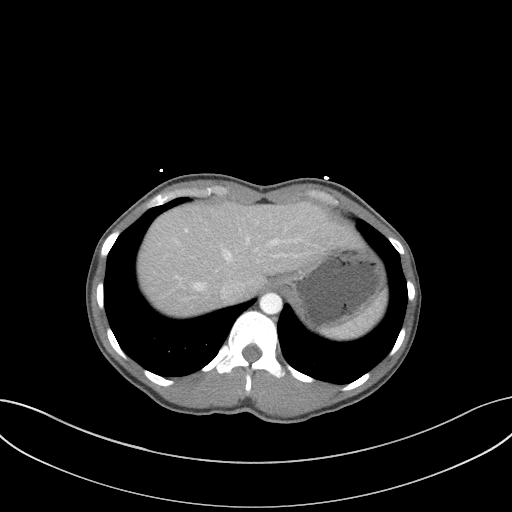
[im 90/94  soft-tissue]
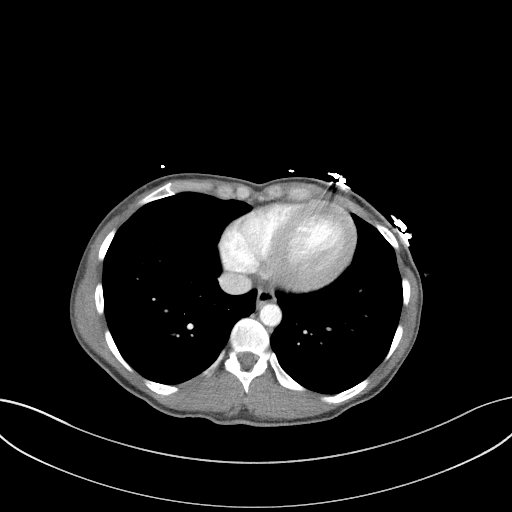

[Series 6: a/p w/ cor · coronal · 0.82mm/px · 3 of 107 slices shown]
[im 36/107  soft-tissue]
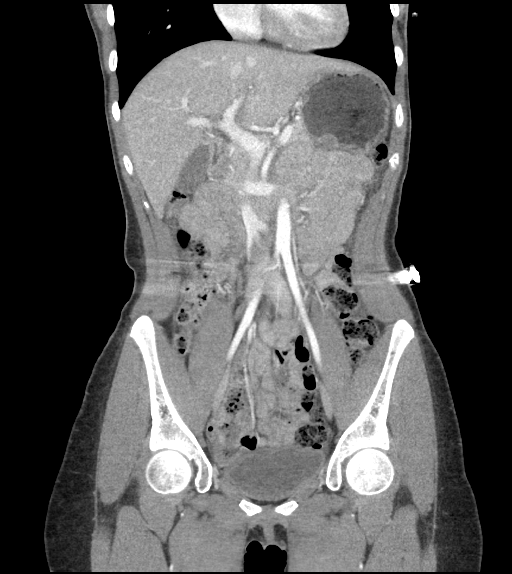
[im 48/107  soft-tissue]
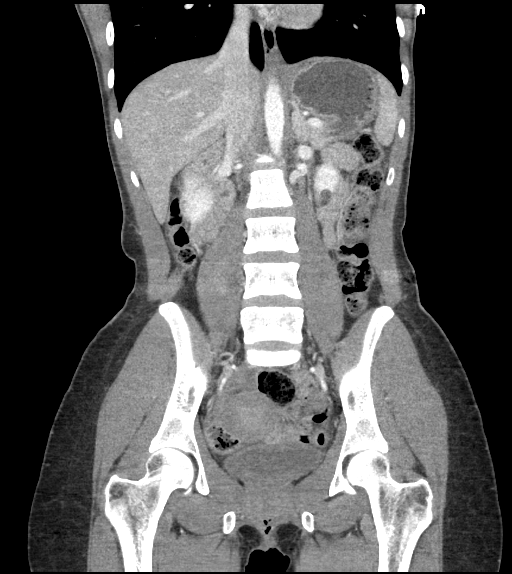
[im 59/107  soft-tissue]
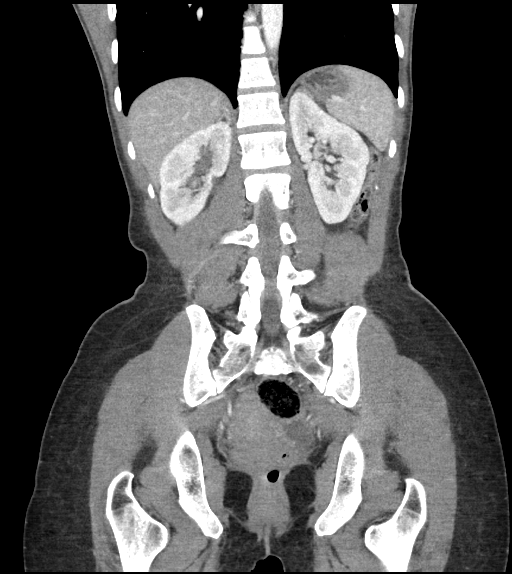

[16 of 46 positions shown; findings below may reference images not displayed]

FINDINGS: Lower chest: Lung bases are clear. No pleural or pericardial
effusion.

Hepatobiliary: No focal liver abnormality is seen. No gallstones,
gallbladder wall thickening, or biliary dilatation.

Pancreas: Unremarkable. No pancreatic ductal dilatation or
surrounding inflammatory changes.

Spleen: Normal in size without focal abnormality.

Adrenals/Urinary Tract: The adrenal glands appear normal.

The patient has mild right hydronephrosis. No urinary tract stones
are identified. Tiny cyst in the upper pole of the right kidney is
noted. The left kidney appears normal. The urinary bladder is
incompletely distended and unremarkable in appearance.

Stomach/Bowel: Stomach is within normal limits. Gastric stimulator
noted. The appendix is not seen but no evidence of appendicitis is
identified. No evidence of bowel wall thickening, distention, or
inflammatory changes.

Vascular/Lymphatic: No significant vascular findings are present. No
enlarged abdominal or pelvic lymph nodes.

Reproductive: Uterus and bilateral adnexa are unremarkable.

Other: Small volume of free pelvic fluid is greater than typically
seen in physiologic change.

Musculoskeletal: Negative.
IMPRESSION: Mild right hydronephrosis without stone or other obstructing lesion
identified.

Small volume of free pelvic fluid is greater than typically seen in
physiologic change but the cause for the fluid is not identified.

## 2021-12-11 LAB — HEMOGLOBIN A1C
Estimated Avg Glucose, External: 190 mg/dL — ABNORMAL HIGH (ref 91–123)
Hemoglobin A1C, External: 8.3 % — ABNORMAL HIGH (ref 4.8–5.6)

## 2021-12-18 LAB — HEMOGLOBIN A1C
Estimated Avg Glucose, External: 183 mg/dL — ABNORMAL HIGH (ref 91–123)
Hemoglobin A1C, External: 8 % — ABNORMAL HIGH (ref 4.8–5.6)

## 2022-03-28 LAB — HEMOGLOBIN A1C
Estimated Avg Glucose, External: 227 mg/dL — ABNORMAL HIGH (ref 91–123)
Hemoglobin A1C, External: 9.5 % — ABNORMAL HIGH (ref 4.8–5.6)

## 2022-09-21 LAB — HEMOGLOBIN A1C
Estimated Avg Glucose, External: 193 mg/dL — ABNORMAL HIGH (ref 91–123)
Hemoglobin A1C, External: 8.3 % — ABNORMAL HIGH (ref 4.8–5.6)

## 2022-09-22 LAB — HEMOGLOBIN A1C
Estimated Avg Glucose, External: 197 mg/dL — ABNORMAL HIGH (ref 91–123)
Hemoglobin A1C, External: 8.5 % — ABNORMAL HIGH (ref 4.8–5.6)

## 2022-11-17 NOTE — Other (Signed)
I have contacted the patient identified as [name] [name] DOB: 1985-12-08 to schedule PSAT appointment for upcoming procedure.    []   No answer--Left VM with callback number.    [x]  No answer--VM full / no VM option / invalid number    []  Pt refused PSAT appt and surgeon's office has been notified.    Electronically signed by Soundra Pilon Cj Edgell on 11/17/22 at 2:44 PM EDT

## 2022-11-25 ENCOUNTER — Inpatient Hospital Stay: Admit: 2022-11-25 | Payer: MEDICARE | Primary: Family Medicine

## 2022-11-25 DIAGNOSIS — Z01812 Encounter for preprocedural laboratory examination: Secondary | ICD-10-CM

## 2022-11-25 LAB — CBC WITH AUTO DIFFERENTIAL
Basophils: 0.4 % (ref 0–3)
Eosinophils: 2.5 % (ref 0–5)
Hematocrit: 42 % (ref 35.0–47.0)
Hemoglobin: 13.5 g/dL (ref 11.0–16.0)
Immature Granulocytes %: 0.4 % (ref 0.0–3.0)
Lymphocytes: 19.9 % — ABNORMAL LOW (ref 28–48)
MCH: 27.8 pg (ref 25.4–34.6)
MCHC: 32.1 g/dL (ref 30.0–36.0)
MCV: 86.6 fL (ref 80.0–98.0)
MPV: 14 fL — ABNORMAL HIGH (ref 6.0–10.0)
Monocytes: 5.9 % (ref 1–13)
Neutrophils Segmented: 70.9 % — ABNORMAL HIGH (ref 34–64)
Nucleated RBCs: 0 (ref 0–0)
Platelets: 275 10*3/uL (ref 140–450)
RBC: 4.85 M/uL (ref 3.60–5.20)
RDW: 45.9 (ref 36.4–46.3)
WBC: 11.4 10*3/uL — ABNORMAL HIGH (ref 4.0–11.0)

## 2022-11-25 LAB — PREGNANCY, URINE: Pregnancy, Urine: NEGATIVE

## 2022-11-25 LAB — ANTIBODY SCREEN: Antibody Screen: NEGATIVE

## 2022-11-25 LAB — ABO/RH: ABO/Rh: O POS

## 2022-11-26 NOTE — Progress Notes (Signed)
PREOPERATIVE INSTRUCTIONS  Please Read Carefully    [x]  Your procedure is scheduled on: 11/28/22     [x]  The day before your surgery, call the surgeon's office to check on the surgery time and the time you should arrive.     [x]  On the day of your surgery, arrive at the time given to you by your surgeon and check in at the first floor registration desk.    [x]  Standard NPO Guidelines per Anesthesia:  [x]  DO NOT eat anything after midnight before your surgery. This includes gum, mints or hard candy.    [x]  May have water, black coffee (NO CREAM) or regular Gatorade (not sugar free) up to 3 hours prior to surgery start time, but no more than 6 ounces per hour (approx.  cup).    []  GLP-1 Medication NPO instructions per Anesthesia:  []  DO NOT eat anything after 5:00pm the night before your surgery. This includes gum, mints or hard candy.      []  May have Sips of water, black coffee (NO CREAM) or regular Gatorade (not sugar free) from 5:00pm to Midnight the night before your surgery.     []  DO NOT eat or drink anything after Midnight the night before your surgery.    []  Cardiac surgery NPO instructions per Anesthesia:  []  Please consume your Ensure PreSurgery drink no later than two hours prior to surgery start time.      []  Entire drink should be consumed within 5-10 minutes of starting.      [x]  You may brush your teeth the morning of surgery, however DO NOT swallow any water.    []  No smoking after midnight.  Smoking should be reduced a few days prior to surgery.    [x]  If you are having an outpatient procedure, you must have a responsible adult bring you to the hospital.  They must remain in the surgical waiting area for the duration of your procedure and provide you with transportation home.  You may not drive yourself home.  We also recommend that you arrange for a responsible person to stay with you for 24 hours following your procedure.  Failure to comply with these instructions may result in  cancellation of the procedure.    []  A parent or legal guardian must accompany minors (under the age of 92) to the hospital.  LEGAL GUARDIANS MUST bring custody papers with them the day of surgery.  A parent/legal guardian will be required to remain at the hospital throughout the minor's duration of stay--To include over night, if minor is admitted to the hospital.      [x]  If the lab gives you a blue blood ID band, do not throw it away.  Bring it with you on the day of surgery.      []  Please return to preop surgical testing no more than 14 days before surgery date to have your type and screen done prior to surgery.    []  If you have been diagnosed with Sleep Apnea and are using a CPAP, BiPAP, and/or Bi-Flex machine, please bring it with you the day of surgery.    []  Endoscopy patients, please follow the instructions provided by the doctors office.    [] If crutches are ordered, you must get them and be instructed on proper use before the day of surgery.  Please bring them with you the day of surgery.      PREPARING THE SKIN BEFORE SURGERY Can Reduce the Risk of Infection  []   You have been given a Chlorhexidine skin preparation packet with instructions to bathe the night before surgery and the morning of surgery prior to arrival.    [x]  If allergic to Chlorhexidine, use Dial (antibacterial) soap to bathe your skin the night before surgery and the morning of surgery.    [x]  Do not shave your face, underarms, legs or any part of your body at least 48 hours before surgery.  Any clipping needed for surgery will be done at the hospital.      PREPARING TO COME TO THE HOSPITAL  [x]  Wear casual, loose fitting comfortable clothes the day of surgery.    [x]  Remove ALL jewelry, including body piercings (failure to remove jewelry/piercings/dermal implants prior to surgery may contribute to electrical burns, infections, skin tears, airway obstruction, loss of circulation/oxygenation).  Leave jewelry and all valuables at home.   Leave suitcases and/or overnight bag at home or in the car for a family member to bring when you get a room.    [x]  DO NOT wear any makeup, deodorant, body lotion, aftershave or contact lenses the day of surgery.   Please bring your glasses and glasses case with you the day of surgery.    [x]  DO NOT wear any fingernail polish, artificial fingernails, or fingernail decoration (nothing other than your natural nails).  The fingernail is used to measure oxygen delivery to the body, these items inhibit the ability to measure this--surgery cannot be performed without the ability to accurately measure oxygen delivery to the body.    [x]  Bring any additional paperwork, such as doctors orders, consent form, POA (power of attorney), and/or advanced directives with you the day of surgery.    [x]  Please notify your surgeon of any changes in your condition such as fever, sore throat or rash as soon as possible before your surgery date.  After office hours, call your surgeon's answering service.    [x] Bring picture identification and insurance cards with you the day of surgery.      MEDICATIONS:  [x]  Stop taking aspirin and aspirin products/NSAIDs (non-steroidal anti-inflammatory drugs such as Motrin, Aleve, Advil, ibuprofen and BC Powder), vitamins and herbal pills 2 weeks before surgery OR as instructed by your doctor.  However, if you take a daily aspirin, please contact your primary care provider (PCP), for instructions prior to surgery.    []  Contact your doctor regarding any blood thinner medications you take (such as Coumadin, Plavix, etc.) for instructions about what to do prior to surgery.    [] If you have diabetes, hold oral diabetic medications the night before surgery and the morning of surgery.  Hold insulin the morning of surgery unless told otherwise by your doctor.    []  If you have asthma or use inhalers please bring them the day of surgery.    [x] Take the following medications (oral medications with a sip of  water)  the day of surgery:   amlodipine        If needed:            Prior to Visit Medications    Medication Sig Taking? Authorizing Provider   lisinopril-hydroCHLOROthiazide (PRINZIDE;ZESTORETIC) 10-12.5 MG per tablet Take 1 tablet by mouth daily Yes [provider]   Insulin Aspart, w/Niacinamide, (FIASP PUMPCART SC) Inject into the skin Yes [provider]   amLODIPine (NORVASC) 5 MG tablet   [provider]   Glucagon (GVOKE HYPOPEN 2-PACK) 1 MG/0.2ML SOAJ AS DIRECTED IN THE EVENT OF UNCONCIOUS HYPOGLYCEMIA DX E10.65  [provider]   insulin glulisine (APIDRA) 100 UNIT/ML injection pen Inject into the skin  [provider]   medroxyPROGESTERone (DEPO-PROVERA) 150 MG/ML injection Inject 1 mL into the muscle  [provider]   amitriptyline (ELAVIL) 25 MG tablet Take 25 mg by mouth  Automatic Reconciliation, Ar   insulin glargine (LANTUS SOLOSTAR) 100 UNIT/ML injection pen Inject 28 Units into the skin every evening  Automatic Reconciliation, Ar   lansoprazole (PREVACID SOLUTAB) 30 MG disintegrating tablet Take 30 mg by mouth every morning (before breakfast)  Automatic Reconciliation, Ar   promethazine (PHENERGAN) 12.5 MG tablet Take 12.5 mg by mouth every 6 hours as needed  Automatic Reconciliation, Ar        *Visitor Policy-Surgical/Procedural Areas: Two people may escort the patient to the department's waiting room and may stay there during the procedure.  One visitor must be 73 years of age or older and the patient must arrive with their post procedure transportation home before procedure start time. The post-procedure transporting party must remain within the surgical waiting room for the duration of the procedure. Failure may result in the cancellation of the procedure*  Surgical Admitting Unit (SAU)/Phase II/Post Anesthesia Care Unit (PACU): Patients are allowed one visitor at a time. Visitors must be 16 or older. Note: If the patient's time in PACU  is expected to be less than one hour, visitation may occur in Phase II Recovery or the patient's room. Visitation is generally limited to 5 - 10 minutes. Pediatric patients are allowed two visitors to accommodate both parents if the patient is hemodynamically stable.    We want you to have a positive experience at [facility].  If any of these these instructions are not met, it is possible that your surgery will be canceled.  If you have any questions regarding your surgery, please call [facility] at [phone] or your surgeon's office for further assistance.           [facility]    [facility] Anesthesia     There are many ways to perform anesthesia for surgery.  The 2 techniques are generally classified as General Anesthesia and Regional Anesthesia.  While both techniques are very safe, they are distinctly different.  As with any anesthesia, there are risks, which may be increased if you already have heart disease, chronic lung conditions, or other serious medical problems.    General anesthesia puts you to sleep for your surgery.  It acts on your brain and nerves, and affects your entire body.  It may be administered by an injection, or through inhaling medication.  After you are asleep, a breathing tube may be placed in your windpipe to help you breathe during surgery.  General anesthesia may be more appropriate for longer or more involved surgery, especially if the position you will be in surgery is uncomfortable.  With general anesthesia you may experience a sore throat and hoarse voice for a few days, headache, nausea/vomiting, drowsiness, or blood pressure and breathing problems.    Regional anesthesia involves blocking the nerves to a specific area of the body with local anesthesia medication ("numbing medicine").  It is usually given in conjunction with varying degrees of twilight sedation.  When at all possible, the recommended type of anesthesia for both total knee replacement (TKA)  and total hip replacement (THA) is a regional anesthesia technique.  This can be achieved utilizing a spinal block technique, or third placement of an epidural catheter.  In addition, your  anesthesiologist may recommend that you have a peripheral nerve block placed to help with pain after the procedure.    -Spinal block-In a spinal block, local anesthetic (i.e. numbing medicine) is injected into the fluid that baths the spinal cord in the lower part of your back.  This produces a rapid numbing effect that wears off in a few hours.  After meeting the anesthesiologist and discussing her medical conditions and history, the anesthesiologist may decide to place a long-acting pain medicine as well.  There are some medical conditions and home medications that may preclude a spinal block.    -Epidural block-An epidural block uses a catheter inserted into your lower back to deliver numbing medicine.  The epidural block in the spinal block are administered in a very similar location and technique; however, the epidural catheter is placed in a slightly different area around the spine as compared to a spinal block.    -Peripheral nerve block (PNB)-For TKA, the anesthesiologist may discuss performing a PNB.  This technique places local anesthetic directly around the major nerves in your thigh, and one or multiple locations.  These blocks numb only the leg that is injected, and do not affect the other leg.  PNB's are used in addition to another anesthesia technique, and are for postoperative pain relief only.    The advantages of regional anesthesia for joint replacement surgery are considered to be significant.  There is a significant reduction in blood loss and blood transfusions, less nausea/vomiting, less drowsiness, improved pain control after surgery, better diabetes control, and less association with infection.  Additionally, there also are reduced risks (as compared to general anesthesia techniques) with serious medical  complications such as heart attack, stroke, pneumonia, respiratory depression, or development of a blood clot in your legs that can go to your lungs.  Like with any technique, there are risks.  With regional anesthesia, there is a low incidence of headache and nerve damage.  Most commonly though, if the regional technique proves challenging, it is a difficulty in getting the numbing medicine in close proximity to the nerves perform the nerve block.  Extremely rarely (1 in 200,000), there can be a collection of blood from around the nerves.  Some patients may experience difficulty voiding (passing urine) for a period of time after a regional technique.

## 2022-11-27 NOTE — H&P (Signed)
Gynecology History and Physical    Name: [name] [name] MRN: [id number] SSN: [id number]    Date of Birth: Aug 31, 1985  Age: 37 y.o.  Sex: female              Devaeh is a 37 y.o. African American female with a history of requested sterilization. Previous Workup included No previous treatment measures.. Previous treatment measures included observation. She is admitted for Procedure(s) (LRB):  LAPAROSCOPIC BILATERAL SALPINGECTOMY (Bilateral).    The current method of family planning is none.    OB History       Gravida   3    Para        Term   1    Preterm   1    AB   0    Living   0         SAB        IAB        Ectopic        Molar        Multiple        Live Births                  Past Medical History:   Diagnosis Date    Cyclical vomiting 07/09/2017    Diabetic ketoacidosis without coma associated with type 1 diabetes mellitus (HCC) 11/05/2015    Drug-seeking behavior     Essential hypertension 03/27/2016    Amlodipine, carvedilol, lisinopril.    Gastroparesis     Insulin long-term use (HCC) 11/20/2016    Slow transit constipation 08/08/2017    Type 1 diabetes mellitus (HCC) 02/17/2009    Diagnosed at age 74. Had insulin pump at one time, but developed keloids at needle insertion sites. Was supposed to establish with [facility].     Past Surgical History:   Procedure Laterality Date    CESAREAN SECTION      PR UNLISTED PROCEDURE ABDOMEN PERITONEUM & OMENTUM  05/2015    Gastric stimulator pump remover in 2021     Social History     Occupational History    Not on file   Tobacco Use    Smoking status: Never    Smokeless tobacco: Never   Vaping Use    Vaping status: Never Used   Substance and Sexual Activity    Alcohol use: No    Drug use: Not Currently     Types: Marijuana Sheran Fava)    Sexual activity: Not on file     Family History   Problem Relation Age of Onset    Stroke Other         great grandmother    Diabetes Father         Allergies   Allergen Reactions    Methocarbamol Other (See Comments)     Body shaking,  nausea    Morphine Itching     Pt denies allergy    Azithromycin Hives    Penicillins Hives     Prior to Admission medications    Medication Sig Start Date End Date Taking? Authorizing Provider   lisinopril-hydroCHLOROthiazide (PRINZIDE;ZESTORETIC) 10-12.5 MG per tablet Take 1 tablet by mouth daily 10/20/22  Yes [provider]   Insulin Aspart, w/Niacinamide, (FIASP PUMPCART SC) Inject into the skin   Yes [provider]   amLODIPine (NORVASC) 5 MG tablet     [provider]   Glucagon (GVOKE HYPOPEN 2-PACK) 1 MG/0.2ML SOAJ AS DIRECTED IN THE EVENT OF Jerelene Redden  HYPOGLYCEMIA DX E10.65    [provider]   insulin glulisine (APIDRA) 100 UNIT/ML injection pen Inject into the skin    [provider]   medroxyPROGESTERone (DEPO-PROVERA) 150 MG/ML injection Inject 1 mL into the muscle    [provider]   amitriptyline (ELAVIL) 25 MG tablet Take 25 mg by mouth 02/27/18   Automatic Reconciliation, Ar   insulin glargine (LANTUS SOLOSTAR) 100 UNIT/ML injection pen Inject 28 Units into the skin every evening    Automatic Reconciliation, Ar   lansoprazole (PREVACID SOLUTAB) 30 MG disintegrating tablet Take 30 mg by mouth every morning (before breakfast) 02/28/18   Automatic Reconciliation, Ar   promethazine (PHENERGAN) 12.5 MG tablet Take 12.5 mg by mouth every 6 hours as needed 02/27/18   Automatic Reconciliation, Ar        Review of Systems:  Constitutional: negative  Eyes: negative  Ears, Nose, Mouth, Throat, and Face: negative  Respiratory: negative  Cardiovascular: negative  Gastrointestinal: negative  Genitourinary:negative  Integument/Breast: negative  Hematologic/Lymphatic: negative  Musculoskeletal:negative  Neurological: negative  Behavioral/Psychiatric: negative  Endocrine: negative  Allergic/Immunologic: negative     Objective:     Vitals:    11/26/22 1005   Weight: 57.6 kg (127 lb)   Height: 1.67 m (5' 5.75")       Physical Exam:  Heart-rate regular with normal  S1-S2  Lungs-clear to auscultation bilaterally without wheezes  Assessment:     Requested sterilization     Plan:     Procedure(s) (LRB):  LAPAROSCOPIC BILATERAL SALPINGECTOMY (Bilateral)  Discussed the risks of surgery including the risks of bleeding, infection, deep vein thrombosis, and surgical injuries to internal organs including but not limited to the bowels, bladder, rectum, and female reproductive organs. The patient understands the risks; any and all questions were answered to the patient's satisfaction.    Signed By:  Doloris Hall, MD     November 27, 2022

## 2022-11-28 ENCOUNTER — Inpatient Hospital Stay
Admit: 2022-11-28 | Discharge: 2022-11-29 | Disposition: A | Discharge status: Home / Self Care | Payer: MEDICARE | Attending: Emergency Medicine

## 2022-11-28 ENCOUNTER — Ambulatory Visit: Admit: 2022-11-28 | Payer: MEDICARE | Primary: Family Medicine

## 2022-11-28 ENCOUNTER — Emergency Department: Admit: 2022-11-28 | Payer: MEDICARE | Primary: Family Medicine

## 2022-11-28 ENCOUNTER — Inpatient Hospital Stay: Payer: MEDICARE | Attending: Obstetrics & Gynecology

## 2022-11-28 DIAGNOSIS — S301XXA Contusion of abdominal wall, initial encounter: Secondary | ICD-10-CM

## 2022-11-28 LAB — POC CHEM 8
BUN: 17 mg/dL (ref 7–25)
Calcium, Ionized: 4.1 mg/dL — ABNORMAL LOW (ref 4.40–5.40)
Chloride: 101 meq/L (ref 98–107)
Creatinine: 1.1 mg/dL (ref 0.6–1.3)
Glucose: 359 mg/dL — ABNORMAL HIGH (ref 74–106)
Hematocrit: 45 % (ref 38–45)
Hemoglobin: 15.3 g/dL (ref 13.0–17.2)
Potassium: 4.7 meq/L (ref 3.5–4.9)
Sodium: 133 meq/L — ABNORMAL LOW (ref 136–145)
Total CO2: 22 mmol/L (ref 21–32)

## 2022-11-28 LAB — URINE DRUG SCREEN
Amphetamine: NEGATIVE
Barbiturates, Urine: NEGATIVE
Benzodiazepines, Urine: NEGATIVE
Cocaine: NEGATIVE
Marijuana: POSITIVE — AB
Methadone Screen, Urine: NEGATIVE
Opiates, Urine: NEGATIVE
Phencyclidine: NEGATIVE

## 2022-11-28 LAB — POC GLUCOSE FINGERSTICK
POC Glucose: 295 mg/dL — ABNORMAL HIGH (ref 65–105)
POC Glucose: 222 mg/dL — ABNORMAL HIGH (ref 65–105)
POC Glucose: 356 mg/dL — ABNORMAL HIGH (ref 65–105)

## 2022-11-28 MED ORDER — ALBUMIN HUMAN 5 % IV SOLN
5 | Freq: Once | INTRAVENOUS | Status: DC | PRN
Start: 2022-11-28 — End: 2022-11-28
  Administered 2022-11-28: 14:00:00 12.5 via INTRAVENOUS

## 2022-11-28 MED ORDER — DIPHENHYDRAMINE HCL 50 MG/ML IJ SOLN
50 | INTRAMUSCULAR | Status: AC
Start: 2022-11-28 — End: 2022-11-28
  Administered 2022-11-28: 23:00:00 25 mg via INTRAVENOUS

## 2022-11-28 MED ORDER — MIDAZOLAM HCL 2 MG/2ML IJ SOLN
2 | Freq: Once | INTRAMUSCULAR | Status: DC | PRN
Start: 2022-11-28 — End: 2022-11-28
  Administered 2022-11-28: 13:00:00 2 via INTRAVENOUS

## 2022-11-28 MED ORDER — SODIUM CHLORIDE 0.9 % IV BOLUS
0.9 | Freq: Once | INTRAVENOUS | Status: AC
Start: 2022-11-28 — End: 2022-11-29
  Administered 2022-11-28: 23:00:00 1000 mL via INTRAVENOUS

## 2022-11-28 MED ORDER — HYDRALAZINE HCL 20 MG/ML IJ SOLN
20 | INTRAMUSCULAR | Status: AC
Start: 2022-11-28 — End: 2022-11-28
  Administered 2022-11-28: 18:00:00 5 via INTRAVENOUS

## 2022-11-28 MED ORDER — HYDROMORPHONE HCL 1 MG/ML IJ SOLN
1 | INTRAMUSCULAR | Status: AC | PRN
Start: 2022-11-28 — End: 2022-11-28
  Administered 2022-11-28 (×4): 0.25 mg via INTRAVENOUS

## 2022-11-28 MED ORDER — CEFAZOLIN SODIUM 1 G IJ SOLR
1 | Freq: Once | INTRAMUSCULAR | Status: DC | PRN
Start: 2022-11-28 — End: 2022-11-28
  Administered 2022-11-28: 14:00:00 2 via INTRAVENOUS

## 2022-11-28 MED ORDER — SODIUM CHLORIDE (PF) 0.9 % IJ SOLN
0.9 | INTRAMUSCULAR | Status: DC | PRN
Start: 2022-11-28 — End: 2022-11-28

## 2022-11-28 MED ORDER — KETOROLAC TROMETHAMINE 30 MG/ML IJ SOLN
30 | Freq: Once | INTRAMUSCULAR | Status: DC | PRN
Start: 2022-11-28 — End: 2022-11-28
  Administered 2022-11-28: 15:00:00 15 via INTRAVENOUS

## 2022-11-28 MED ORDER — LACTATED RINGERS IV SOLN
INTRAVENOUS | Status: DC | PRN
Start: 2022-11-28 — End: 2022-11-28
  Administered 2022-11-28: 13:00:00 via INTRAVENOUS

## 2022-11-28 MED ORDER — SODIUM CHLORIDE (PF) 0.9 % IJ SOLN
0.9 | Freq: Once | INTRAMUSCULAR | Status: AC
Start: 2022-11-28 — End: 2022-11-28
  Administered 2022-11-28: 12:00:00 10 mL via INTRAVENOUS

## 2022-11-28 MED ORDER — ACETAMINOPHEN 10 MG/ML IV SOLN
10 | Freq: Once | INTRAVENOUS | Status: DC | PRN
Start: 2022-11-28 — End: 2022-11-28
  Administered 2022-11-28: 14:00:00 1000 via INTRAVENOUS

## 2022-11-28 MED ORDER — FENTANYL CITRATE (PF) 100 MCG/2ML IJ SOLN
100 | INTRAMUSCULAR | Status: DC | PRN
Start: 2022-11-28 — End: 2022-11-28
  Administered 2022-11-28 (×3): 50 ug via INTRAVENOUS

## 2022-11-28 MED ORDER — ONDANSETRON HCL 4 MG/2ML IJ SOLN
4 | Freq: Once | INTRAMUSCULAR | Status: AC
Start: 2022-11-28 — End: 2022-11-28
  Administered 2022-11-28: 22:00:00 4 mg via INTRAVENOUS

## 2022-11-28 MED ORDER — LABETALOL HCL 5 MG/ML IV SOLN
5 | Freq: Once | INTRAVENOUS | Status: AC
Start: 2022-11-28 — End: 2022-11-28
  Administered 2022-11-28: 16:00:00 10 mg via INTRAVENOUS

## 2022-11-28 MED ORDER — NORMAL SALINE FLUSH 0.9 % IV SOLN
0.9 | INTRAVENOUS | Status: DC | PRN
Start: 2022-11-28 — End: 2022-11-28

## 2022-11-28 MED ORDER — MORPHINE SULFATE 4 MG/ML IJ SOLN
4 | INTRAMUSCULAR | Status: AC
Start: 2022-11-28 — End: 2022-11-28
  Administered 2022-11-28: 22:00:00 4 mg via INTRAVENOUS

## 2022-11-28 MED ORDER — ONDANSETRON HCL 4 MG/2ML IJ SOLN
4 | Freq: Once | INTRAMUSCULAR | Status: AC | PRN
Start: 2022-11-28 — End: 2022-11-28
  Administered 2022-11-28: 19:00:00 4 mg via INTRAVENOUS

## 2022-11-28 MED ORDER — BUPIVACAINE HCL (PF) 0.5 % IJ SOLN
0.5 | INTRAMUSCULAR | Status: DC | PRN
Start: 2022-11-28 — End: 2022-11-28
  Administered 2022-11-28: 13:00:00 20 via INTRADERMAL

## 2022-11-28 MED ORDER — DIPHENHYDRAMINE HCL 50 MG/ML IJ SOLN
50 | Freq: Once | INTRAMUSCULAR | Status: AC
Start: 2022-11-28 — End: 2022-11-28
  Administered 2022-11-28: 19:00:00 25 mg via INTRAVENOUS

## 2022-11-28 MED ORDER — LABETALOL HCL 5 MG/ML IV SOLN
5 | Freq: Once | INTRAVENOUS | Status: DC | PRN
Start: 2022-11-28 — End: 2022-11-28
  Administered 2022-11-28: 15:00:00 5 via INTRAVENOUS

## 2022-11-28 MED ORDER — HYDRALAZINE HCL 20 MG/ML IJ SOLN
20 | Freq: Once | INTRAMUSCULAR | Status: AC
Start: 2022-11-28 — End: 2022-11-28

## 2022-11-28 MED ORDER — BUPIVACAINE HCL (PF) 0.5 % IJ SOLN
0.5 | INTRAMUSCULAR | Status: AC
Start: 2022-11-28 — End: ?

## 2022-11-28 MED ORDER — ONDANSETRON HCL 4 MG/2ML IJ SOLN
4 | Freq: Once | INTRAMUSCULAR | Status: DC | PRN
Start: 2022-11-28 — End: 2022-11-28
  Administered 2022-11-28: 14:00:00 4 via INTRAVENOUS

## 2022-11-28 MED ORDER — HYDRALAZINE HCL 20 MG/ML IJ SOLN
20 | INTRAMUSCULAR | Status: AC
Start: 2022-11-28 — End: 2022-11-28
  Administered 2022-11-28: 19:00:00 5 mg via INTRAVENOUS

## 2022-11-28 MED ORDER — PROPOFOL 200 MG/20ML IV EMUL
200 | Freq: Once | INTRAVENOUS | Status: DC | PRN
Start: 2022-11-28 — End: 2022-11-28
  Administered 2022-11-28 (×2): 50 via INTRAVENOUS

## 2022-11-28 MED ORDER — NORMAL SALINE FLUSH 0.9 % IV SOLN
0.9 | Freq: Two times a day (BID) | INTRAVENOUS | Status: DC
Start: 2022-11-28 — End: 2022-11-28

## 2022-11-28 MED ORDER — ROCURONIUM BROMIDE 50 MG/5ML IV SOLN
50 | Freq: Once | INTRAVENOUS | Status: DC | PRN
Start: 2022-11-28 — End: 2022-11-28
  Administered 2022-11-28: 14:00:00 50 via INTRAVENOUS

## 2022-11-28 MED ORDER — ONDANSETRON HCL 4 MG/2ML IJ SOLN
4 | Freq: Once | INTRAMUSCULAR | Status: AC
Start: 2022-11-28 — End: 2022-11-28
  Administered 2022-11-28: 23:00:00 4 mg via INTRAVENOUS

## 2022-11-28 MED ORDER — FENTANYL 0.05 MG/ML SOLN (MIXTURES ONLY)
0.05 | Freq: Once | Status: DC | PRN
Start: 2022-11-28 — End: 2022-11-28
  Administered 2022-11-28 (×2): 50 via INTRAVENOUS

## 2022-11-28 MED ORDER — LIDOCAINE HCL (PF) 2 % IJ SOLN
2 | Freq: Once | INTRAMUSCULAR | Status: DC | PRN
Start: 2022-11-28 — End: 2022-11-28
  Administered 2022-11-28: 14:00:00 60 via INTRAVENOUS

## 2022-11-28 MED ORDER — PHENYLEPHRINE HCL 10 MG/ML SOLN (MIXTURES ONLY)
10 | Freq: Once | Status: DC | PRN
Start: 2022-11-28 — End: 2022-11-28
  Administered 2022-11-28 (×2): 50 via INTRAVENOUS

## 2022-11-28 MED ORDER — INSULIN REGULAR HUMAN 100 UNIT/ML IJ SOLN
100 | Freq: Once | INTRAMUSCULAR | Status: AC
Start: 2022-11-28 — End: 2022-11-28
  Administered 2022-11-28: 23:00:00 5 [IU] via INTRAVENOUS

## 2022-11-28 MED ORDER — MIDAZOLAM HCL 2 MG/2ML IJ SOLN
2 | INTRAMUSCULAR | Status: AC
Start: 2022-11-28 — End: ?

## 2022-11-28 MED ORDER — LABETALOL HCL 5 MG/ML IV SOLN
5 | Freq: Once | INTRAVENOUS | Status: AC
Start: 2022-11-28 — End: 2022-11-28
  Administered 2022-11-28: 17:00:00 5 mg via INTRAVENOUS

## 2022-11-28 MED ORDER — OXYCODONE-ACETAMINOPHEN 5-325 MG PO TABS
5-325 | ORAL | Status: DC | PRN
Start: 2022-11-28 — End: 2022-11-28

## 2022-11-28 MED ORDER — FENTANYL CITRATE (PF) 100 MCG/2ML IJ SOLN
100 | INTRAMUSCULAR | Status: AC
Start: 2022-11-28 — End: ?

## 2022-11-28 MED ORDER — DIPHENHYDRAMINE HCL 50 MG/ML IJ SOLN
50 | Freq: Once | INTRAMUSCULAR | Status: AC | PRN
Start: 2022-11-28 — End: 2022-11-28
  Administered 2022-11-28: 16:00:00 12.5 mg via INTRAVENOUS

## 2022-11-28 MED ORDER — SUGAMMADEX SODIUM 200 MG/2ML IV SOLN
200 | Freq: Once | INTRAVENOUS | Status: DC | PRN
Start: 2022-11-28 — End: 2022-11-28
  Administered 2022-11-28: 15:00:00 200 via INTRAVENOUS

## 2022-11-28 MED ORDER — SODIUM CHLORIDE 0.9 % IV SOLN
0.9 | INTRAVENOUS | Status: DC | PRN
Start: 2022-11-28 — End: 2022-11-28

## 2022-11-28 MED ORDER — OXYCODONE HCL 5 MG PO TABS
5 | Freq: Once | ORAL | Status: DC | PRN
Start: 2022-11-28 — End: 2022-11-28

## 2022-11-28 MED ORDER — LIDOCAINE HCL (PF) 1 % IJ SOLN
1 | Freq: Once | INTRAMUSCULAR | Status: AC
Start: 2022-11-28 — End: 2022-11-28
  Administered 2022-11-28: 12:00:00 1 mL via INTRADERMAL

## 2022-11-28 MED ORDER — PROCHLORPERAZINE EDISYLATE 10 MG/2ML IJ SOLN
10 | Freq: Once | INTRAMUSCULAR | Status: DC | PRN
Start: 2022-11-28 — End: 2022-11-28

## 2022-11-28 MED ORDER — OXYCODONE-ACETAMINOPHEN 5-325 MG PO TABS
5-325 | ORAL_TABLET | ORAL | 0 refills | 5.00000 days | Status: DC | PRN
Start: 2022-11-28 — End: 2023-10-29

## 2022-11-28 MED FILL — MARCAINE PRESERVATIVE FREE 0.5 % IJ SOLN: 0.5 % | INTRAMUSCULAR | Qty: 30

## 2022-11-28 MED FILL — DIPHENHYDRAMINE HCL 50 MG/ML IJ SOLN: 50 MG/ML | INTRAMUSCULAR | Qty: 1

## 2022-11-28 MED FILL — ONDANSETRON HCL 4 MG/2ML IJ SOLN: 4 MG/2ML | INTRAMUSCULAR | Qty: 2

## 2022-11-28 MED FILL — FENTANYL CITRATE (PF) 100 MCG/2ML IJ SOLN: 100 MCG/2ML | INTRAMUSCULAR | Qty: 2

## 2022-11-28 MED FILL — HYDROMORPHONE HCL 1 MG/ML IJ SOLN: 1 MG/ML | INTRAMUSCULAR | Qty: 1

## 2022-11-28 MED FILL — MIDAZOLAM HCL 2 MG/2ML IJ SOLN: 2 MG/ML | INTRAMUSCULAR | Qty: 2

## 2022-11-28 MED FILL — HUMULIN R 100 UNIT/ML IJ SOLN: 100 UNIT/ML | INTRAMUSCULAR | Qty: 5

## 2022-11-28 MED FILL — LABETALOL HCL 5 MG/ML IV SOLN: 5 MG/ML | INTRAVENOUS | Qty: 4

## 2022-11-28 MED FILL — HYDRALAZINE HCL 20 MG/ML IJ SOLN: 20 MG/ML | INTRAMUSCULAR | Qty: 1

## 2022-11-28 MED FILL — MORPHINE SULFATE 4 MG/ML IJ SOLN: 4 mg/mL | INTRAMUSCULAR | Qty: 1

## 2022-11-28 NOTE — Discharge Instructions (Addendum)
Results for orders placed or performed during the hospital encounter of 11/28/22   US ABDOMEN LIMITED    Narrative    US ABDOMEN LIMITED    INDICATION: RLQ hematoma. S/p Salpingectomy.    COMPARISON: Same-day ultrasound    TECHNIQUE: The examination was performed using grayscale and color Doppler.    FINDINGS:    Decreasing size of right lower quadrant hematoma, currently measuring 4.7 x 2.6  x 2.4 cm. On earlier ultrasound it measured 5.7 x 3.0 x 5.2 cm. No vascularity  associated with this collection.      Impression    IMPRESSION:    Decreasing size of right lower quadrant hematoma.    Electronically signed by: Linus Galas, DO 11/28/2022 6:53 PM EDT            Workstation ID: [id number]     POC CHEM 8   Result Value Ref Range    Sodium 133 (L) 136 - 145 mEq/L    Potassium 4.7 3.5 - 4.9 mEq/L    Chloride 101 98 - 107 mEq/L    Total CO2 22 21 - 32 mmol/L    Glucose 359 (H) 74 - 106 mg/dL    BUN 17 7 - 25 mg/dl    Creatinine 1.1 0.6 - 1.3 mg/dl    Hematocrit 45 38 - 45 %    Hemoglobin 15.3 13.0 - 17.2 gm/dl    Calcium, Ionized 2.53 (L) 4.40 - 5.40 mg/dL   POC Glucose Fingerstick   Result Value Ref Range    POC Glucose 356 (H) 65 - 105 mg/dL   Results for orders placed or performed during the hospital encounter of 11/28/22   US ABDOMEN LIMITED    Narrative    History: post op [place] of RLQ r/o hematoma..      Impression    Impression: Sonography of the right lower quadrant reveals a complex avascular  fluid collection measuring 5.7 x 3.0 x 5.2 cm compatible with a hematoma.  Correlate clinically.    Electronically signed by: Dirk Dress III, MD 11/28/2022 1:34 PM EDT            Workstation ID: [id number]     Urine Drug Screen   Result Value Ref Range    Amphetamine NEGATIVE NEGATIVE      Barbiturates, Urine NEGATIVE NEGATIVE      Benzodiazepines, Urine NEGATIVE NEGATIVE      Cocaine NEGATIVE NEGATIVE      Marijuana POSITIVE (A) NEGATIVE      Methadone Screen, Urine NEGATIVE NEGATIVE      Opiates, Urine NEGATIVE  NEGATIVE      Phencyclidine NEGATIVE NEGATIVE     POC Glucose Fingerstick   Result Value Ref Range    POC Glucose 295 (H) 65 - 105 mg/dL   POC Glucose Fingerstick   Result Value Ref Range    POC Glucose 222 (H) 65 - 105 mg/dL      Return to the ED if develop worsening of abdominal pain, uncontrolled vomiting, fever, bleeding from the wound site.  Follow-up with Dr. Mayford Knife on Monday.

## 2022-11-28 NOTE — Anesthesia Pre-Procedure Evaluation (Signed)
Department of Anesthesiology  Preprocedure Note       Name:  [name] [name]   Age:  37 y.o.  DOB:  [birth year]                                          MRN:  [id number]         Date:  11/28/2022      Surgeon: Moishe Spice):  Janice Coffin, MD    Procedure: Procedure(s):  LAPAROSCOPIC BILATERAL SALPINGECTOMY    Medications prior to admission:   Prior to Admission medications    Medication Sig Start Date End Date Taking? Authorizing Provider   lisinopril-hydroCHLOROthiazide (PRINZIDE;ZESTORETIC) 10-12.5 MG per tablet Take 1 tablet by mouth daily 10/20/22  Yes [provider]   amLODIPine (NORVASC) 5 MG tablet    Yes [provider]   Insulin Aspart, w/Niacinamide, (FIASP PUMPCART SC) Inject into the skin   Yes [provider]   Glucagon (GVOKE HYPOPEN 2-PACK) 1 MG/0.2ML SOAJ AS DIRECTED IN THE EVENT OF UNCONCIOUS HYPOGLYCEMIA DX E10.65    [provider]   insulin glulisine (APIDRA) 100 UNIT/ML injection pen Inject into the skin    [provider]   medroxyPROGESTERone (DEPO-PROVERA) 150 MG/ML injection Inject 1 mL into the muscle    [provider]   amitriptyline (ELAVIL) 25 MG tablet Take 25 mg by mouth  Patient not taking: Reported on 11/28/2022 02/27/18   Automatic Reconciliation, Ar   insulin glargine (LANTUS SOLOSTAR) 100 UNIT/ML injection pen Inject 28 Units into the skin every evening    Automatic Reconciliation, Ar   lansoprazole (PREVACID SOLUTAB) 30 MG disintegrating tablet Take 30 mg by mouth every morning (before breakfast)  Patient not taking: Reported on 11/28/2022 02/28/18   Automatic Reconciliation, Ar   promethazine (PHENERGAN) 12.5 MG tablet Take 12.5 mg by mouth every 6 hours as needed  Patient not taking: Reported on 11/28/2022 02/27/18   Automatic Reconciliation, Ar       Current medications:    Current Facility-Administered Medications   Medication Dose Route Frequency Provider Last Rate Last Admin   . sodium chloride (PF) 0.9 % injection 10 mL  10 mL  IntraVENous Once Doloris Hall A, MD       . lidocaine PF 1 % injection 1 mL  1 mL IntraDERmal Once Janice Coffin, MD           Allergies:    Allergies   Allergen Reactions   . Methocarbamol Other (See Comments)     Body shaking, nausea   . Morphine Itching     Pt denies allergy   . Azithromycin Hives   . Penicillins Hives       Problem List:    Patient Active Problem List   Diagnosis Code   . Insulin long-term use (HCC) Z79.4   . Slow transit constipation K59.01   . Marijuana use F12.90   . Intractable nausea and vomiting R11.2   . Leukocytosis D72.829   . DKA, type 1 (HCC) E10.10   . Cyclical vomiting R11.15   . Essential hypertension I10   . Diabetic gastroparesis (HCC) E11.43, K31.84   . Intractable vomiting R11.10   . Moderate protein-calorie malnutrition (HCC) E44.0   . Diabetic ketoacidosis without coma associated with type 1 diabetes mellitus (HCC) E10.10   . Type 1 diabetes mellitus (HCC) E10.9   . Gastritis  K29.70       Past Medical History:        Diagnosis Date   . Cyclical vomiting 07/09/2017   . Diabetic ketoacidosis without coma associated with type 1 diabetes mellitus (HCC) 11/05/2015   . Drug-seeking behavior    . Essential hypertension 03/27/2016    Amlodipine, carvedilol, lisinopril.   . Gastroparesis    . Insulin long-term use (HCC) 11/20/2016   . Slow transit constipation 08/08/2017   . Type 1 diabetes mellitus (HCC) 02/17/2009    Diagnosed at age 34. Had insulin pump at one time, but developed keloids at needle insertion sites. Was supposed to establish with [facility].       Past Surgical History:        Procedure Laterality Date   . CESAREAN SECTION     . PR UNLISTED PROCEDURE ABDOMEN PERITONEUM & OMENTUM  05/2015    Gastric stimulator pump remover in 2021       Social History:    Social History     Tobacco Use   . Smoking status: Never   . Smokeless tobacco: Never   Substance Use Topics   . Alcohol use: No                                Counseling given: Not Answered      Vital Signs  (Current):   Vitals:    11/26/22 1005 11/28/22 0712   BP:  (!) 196/92   Pulse:  90   Resp:  18   Temp:  98.4 F (36.9 C)   TempSrc:  Oral   SpO2:  100%   Weight: 57.6 kg (127 lb) 57.4 kg (126 lb 8.7 oz)   Height: 1.67 m (5' 5.75") 1.676 m (5\' 6" )                                              BP Readings from Last 3 Encounters:   11/28/22 (!) 196/92       NPO Status: Time of last liquid consumption: 2330                        Time of last solid consumption: 2330                        Date of last liquid consumption: 11/27/22                        Date of last solid food consumption: 11/27/22    BMI:   Wt Readings from Last 3 Encounters:   11/28/22 57.4 kg (126 lb 8.7 oz)     Body mass index is 20.42 kg/m.    CBC:   Lab Results   Component Value Date/Time    WBC 11.4 11/25/2022 09:52 AM    RBC 4.85 11/25/2022 09:52 AM    HGB 13.5 11/25/2022 09:52 AM    HCT 42.0 11/25/2022 09:52 AM    MCV 86.6 11/25/2022 09:52 AM    RDW 45.9 11/25/2022 09:52 AM    PLT 275 11/25/2022 09:52 AM       CMP:   Lab Results   Component Value Date/Time    NA 135 08/16/2020 03:50 AM  K 3.4 08/16/2020 03:50 AM    CL 110 08/16/2020 03:50 AM    CO2 21 08/16/2020 03:50 AM    CO2 21.0 02/24/2018 09:48 PM    BUN 8 08/16/2020 03:50 AM    CREATININE 0.99 08/16/2020 03:50 AM    GFRAA >60.0 08/16/2020 03:50 AM    GLUCOSE 20 08/16/2020 03:50 AM    CALCIUM 8.9 08/16/2020 03:50 AM    BILITOT 0.40 08/16/2020 03:50 AM    ALKPHOS 66 08/16/2020 03:50 AM    AST 11.0 08/16/2020 03:50 AM    ALT <7 08/16/2020 03:50 AM       POC Tests: No results for input(s): "POCGLU", "POCNA", "POCK", "POCCL", "POCBUN", "POCHEMO", "POCHCT" in the last 72 hours.    Coags: No results found for: "PROTIME", "INR", "APTT"    HCG (If Applicable):   Lab Results   Component Value Date    PREGTESTUR NEGATIVE 11/25/2022        ABGs: No results found for: "PHART", "PO2ART", "PCO2ART", "HCO3ART", "BEART", "O2SATART"     Type & Screen (If Applicable):  Lab Results   Component Value Date     ABORH O Rh Positive 11/25/2022    LABANTI NEG 11/25/2022       Drug/Infectious Status (If Applicable):  No results found for: "HIV", "HEPCAB"    COVID-19 Screening (If Applicable): No results found for: "COVID19"        Anesthesia Evaluation  Patient summary reviewed  Airway: Mallampati: II  TM distance: >3 FB     Mouth opening: > = 3 FB   Dental:    (+) implants  Comment: Upper dental implant.    Pulmonary:normal exam    (+)           current smoker (Smokes marijuana occasionally.)                           Cardiovascular:  Exercise tolerance: good (>4 METS)  (+) hypertension:                  Neuro/Psych:                ROS comment: Drug-seeking behavior. GI/Hepatic/Renal:            ROS comment: Gastroparesis..   Endo/Other:    (+) Diabetes (On insulin pump. H/o DKA.)Type I DM.                 Abdominal:             Vascular: negative vascular ROS.         Other Findings:       Anesthesia Plan      general     ASA 3       Induction: intravenous.    MIPS: Postoperative opioids intended and Prophylactic antiemetics administered.  Anesthetic plan and risks discussed with patient.      Plan discussed with CRNA.                Linton Rump, MD   11/28/2022

## 2022-11-28 NOTE — Op Note (Signed)
[address] [facility]  Operation Report  NAME:  [name] [name], [name] [name]  SEX:   F  DATE: 11/28/2022  DOB: Jul 13, 1985  MR#    [id number]  ROOM:  PL  ACCT#  [id number]    cc: Doloris Hall MD      PREOPERATIVE DIAGNOSIS:   Undesired fertility.     POSTOPERATIVE DIAGNOSES:   1.  Undesired fertility.  2.  Pelvic adhesive disease.     PROCEDURE:   Laparoscopic bilateral salpingectomy.     SURGEON:   Agnes Probert A. Mayford Knife, MD.     ASSISTANTMaryclare Labrador Mayo, SA.     ANESTHESIA:   General endotracheal by Donella Stade, MD.      COMPLICATIONS:   None.     FINDINGS:  1.  Mesenteric adhesions to anterior abdominal wall.  2.  Normal-appearing appendix.  3.  Normal bowel surfaces.  4.  Normal uterus, tubes and ovaries.   5.  Mesenteric adhesions to [name] anterior abdominal wall.     INDICATIONS FOR SURGERY:   [name] [name] is a 37 year old desirous of permanent sterilization.  She was counseled regarding [name] risks of surgery including damage to internal organs, bleeding possibly requiring transfusion, pelvic infection and even death and she agreed to proceed.     DESCRIPTION OF PROCEDURE:   [name] [name] was taken to [name] operating room where general anesthesia was found to be adequate.  She was placed in [name] dorsal lithotomy position and prepped and draped in normal sterile fashion.  A red Robinson catheter was introduced into her bladder through [name] urethra that produced approximately 50 mL of clear urine.  A weighted speculum was placed posteriorly and a Sims retractor placed anteriorly, exposing [name] anterior cervix which was grasped with a single-tooth tenaculum.  [name] uterus was then sounded to approximately 6 cm and a HUMI uterine manipulator was then introduced into [name] uterus without difficulty.  [name] single-tooth tenaculum was removed from [name] anterior cervix, inspected and noted to be hemostatic.      Attention was then turned to [name] abdomen where a 0.5 cm incision was made with a scalpel at [name] level of [name] umbilicus.  A Veress needle was  tested for proper functioning and then introduced into this incision without difficulty.  Proper placement of [name] Veress needle was confirmed using [name] water drop test.  [name] abdomen was then insufflated with approximately 2.5 liters of carbon dioxide gas.  Proper placement of insufflated gas was confirmed with percussion over [name] liver surfaces.     [name] Veress needle was then removed and a 5 mm trocar was introduced through [name] incision without difficulty.     [name] trocar blade was then removed, a 5 mm laparoscope was placed through [name] trocar sleeve and [name] entire pelvis was surveyed with [name] above-noted findings.     Attention was then turned to [name] left and right lower quadrants where 5 and 8 mm incisions were made in [name] right and left lower quadrants, respectively.     [name] [name] was placed in Trendelenburg position and [name] left fallopian tube was elevated out of [name] pelvis using laparoscopic graspers.  [name] Voyant device was then used to clamp, fulgurate and divide [name] tube from its blood supply and uterine attachment.  [name] operative site was inspected and noted to be hemostatic.  [name] right fallopian tube was then taken out of [name] pelvis through [name] 8 mm trocar sleeve.  Attention was then turned to [name] left fallopian tube where it was  divided from its blood supply and uterine attachment in a similar fashion.  This operative site was inspected and noted to be hemostatic.  [name] [name] was then taken out of Trendelenburg and all 3 trocar sleeves were removed under direct visualization using [name] laparoscope.  All [name] carbon dioxide gas was allowed to escape.  [name] skin was then closed using 4-0 Monocryl in a subcuticular fashion.  Attention was then turned vaginally as [name] HUMI uterine manipulator was removed from [name] intrauterine cavity and once again [name] anterior cervix was inspected and noted to be hemostatic.  All instrument and lap counts were correct times 2.     DISPOSITION:   [name] [name] tolerated [name]  procedure and was taken to [name] recovery room in good condition.     SPECIMENS:   Right and left fallopian tubes sent to pathology.      ___________________  Janice Coffin MD   Dictated UE:AVWU A. Mayford Knife, MD  [facility]  D: 11/28/2022 11:08:59  T: 11/28/2022 12:00:40  [phone]

## 2022-11-28 NOTE — Progress Notes (Signed)
Ride-DaughterEual Fines Hollomon [phone]

## 2022-11-28 NOTE — Progress Notes (Signed)
Dr. Tressia Danas notified of evelavated b/p.  Order for 5mg  Labetelol

## 2022-11-28 NOTE — ED Provider Notes (Signed)
[facility]  Emergency Department      Patient: [name] [name] Age: 37 y.o. Sex: female    Date of Birth: 06-27-1985 Admit Date: 11/28/2022 PCP: Lyndon Code, MD   MRN: [id number]  CSN: [id number]     Room: H11/H11 Time Dictated: 12:57 AM        Chief Complaint   Chief Complaint   Patient presents with    Post-op Problem       History of Present Illness   This is a 37 y.o. female with history of diabetes, hypertension, cyclic vomiting syndrome, gastroparesis, status post bilateral salpingectomy today by Dr. Mayford Knife who comes to the ED due to bleeding from the incision site associated to diffuse abdominal pain, nausea and vomiting.  Patient states that she was discharged 1 hour ago and noticed bleeding from the incision site and decided to come back to the hospital.  She also reports diffuse abdominal pain, nausea and vomiting since she had the surgery.    Review of Systems   Review of Systems   Constitutional: Negative.  Negative for fever.   HENT: Negative.     Eyes: Negative.    Respiratory: Negative.  Negative for shortness of breath.    Cardiovascular: Negative.  Negative for chest pain.   Gastrointestinal:  Positive for abdominal pain, nausea and vomiting. Negative for blood in stool.   Genitourinary: Negative.    Musculoskeletal: Negative.    Skin:  Positive for wound.   Neurological: Negative.          Past Medical/Surgical History     Past Medical History:   Diagnosis Date    Cyclical vomiting 07/09/2017    Diabetic ketoacidosis without coma associated with type 1 diabetes mellitus (HCC) 11/05/2015    Drug-seeking behavior     Essential hypertension 03/27/2016    Amlodipine, carvedilol, lisinopril.    Gastroparesis     Insulin long-term use (HCC) 11/20/2016    Slow transit constipation 08/08/2017    Type 1 diabetes mellitus (HCC) 02/17/2009    Diagnosed at age 31. Had insulin pump at one time, but developed keloids at needle insertion sites. Was supposed to establish with [facility].      Past Surgical History:   Procedure Laterality Date    CESAREAN SECTION      PR UNLISTED PROCEDURE ABDOMEN PERITONEUM & OMENTUM  05/2015    Gastric stimulator pump remover in 2021       Social History     Social History     Socioeconomic History    Marital status: Single     Spouse name: Not on file    Number of children: Not on file    Years of education: Not on file    Highest education level: Not on file   Occupational History    Not on file   Tobacco Use    Smoking status: Never    Smokeless tobacco: Never   Vaping Use    Vaping status: Never Used   Substance and Sexual Activity    Alcohol use: No    Drug use: Not Currently     Types: Marijuana Sheran Fava)    Sexual activity: Not on file   Other Topics Concern    Not on file   Social History Narrative    Not on file     Social Determinants of Health     Financial Resource Strain: Not on file   Food Insecurity: No Food Insecurity (08/22/2019)  Received from [facility] visits prior to 03/29/2022.    Hunger Vital Sign     Worried About Programme researcher, broadcasting/film/video in the Last Year: Never true     Ran Out of Food in the Last Year: Never true   Transportation Needs: Not on file   Physical Activity: Not on file   Stress: Not on file   Social Connections: Not on file   Intimate Partner Violence: Not on file   Housing Stability: Not on file       Family History     Family History   Problem Relation Age of Onset    Stroke Other         great grandmother    Diabetes Father        Current Medications     Current Facility-Administered Medications   Medication Dose Route Frequency Provider Last Rate Last Admin    oxyCODONE (ROXICODONE) immediate release tablet 5 mg  5 mg Oral Q4H PRN Loa Socks, MD   5 mg at 11/28/22 2123     Current Outpatient Medications   Medication Sig Dispense Refill    ondansetron (ZOFRAN-ODT) 4 MG disintegrating tablet Take 1 tablet by mouth every 8 hours as needed for Nausea or Vomiting 15 tablet 0    oxyCODONE-acetaminophen  (PERCOCET) 5-325 MG per tablet Take 1 tablet by mouth every 4 hours as needed for Pain for up to 3 days. Max Daily Amount: 6 tablets 20 tablet 0    lisinopril-hydroCHLOROthiazide (PRINZIDE;ZESTORETIC) 10-12.5 MG per tablet Take 1 tablet by mouth daily      amLODIPine (NORVASC) 5 MG tablet       Glucagon (GVOKE HYPOPEN 2-PACK) 1 MG/0.2ML SOAJ AS DIRECTED IN THE EVENT OF UNCONCIOUS HYPOGLYCEMIA DX E10.65      insulin glulisine (APIDRA) 100 UNIT/ML injection pen Inject into the skin      medroxyPROGESTERone (DEPO-PROVERA) 150 MG/ML injection Inject 1 mL into the muscle      Insulin Aspart, w/Niacinamide, (FIASP PUMPCART SC) Inject into the skin      insulin glargine (LANTUS SOLOSTAR) 100 UNIT/ML injection pen Inject 28 Units into the skin every evening         Allergies     Allergies   Allergen Reactions    Methocarbamol Other (See Comments)     Body shaking, nausea    Azithromycin Hives    Penicillins Hives       Physical Exam   Patient Vitals for the past 24 hrs:   Temp Pulse Resp BP SpO2   11/28/22 2221 -- 95 15 (!) 181/88 100 %   11/28/22 1610 98.4 F (36.9 C) (!) 106 20 (!) 188/107 100 %     Physical Exam  Vitals and nursing note reviewed.   Constitutional:       General: She is not in acute distress.     Appearance: Normal appearance. She is normal weight. She is not ill-appearing, toxic-appearing or diaphoretic.      Comments: In pain   HENT:      Head: Normocephalic and atraumatic.      Nose: Nose normal.      Mouth/Throat:      Mouth: Mucous membranes are moist.   Eyes:      Extraocular Movements: Extraocular movements intact.      Conjunctiva/sclera: Conjunctivae normal.      Pupils: Pupils are equal, round, and reactive to light.   Cardiovascular:      Rate and  Rhythm: Regular rhythm. Tachycardia present.      Pulses: Normal pulses.           Dorsalis pedis pulses are 2+ on the right side and 2+ on the left side.      Heart sounds: Normal heart sounds.   Pulmonary:      Effort: Pulmonary effort is normal.       Breath sounds: Normal breath sounds.   Abdominal:      General: Abdomen is flat. Bowel sounds are normal. There is no distension.      Palpations: Abdomen is soft.      Tenderness: There is generalized abdominal tenderness. There is guarding (Voluntary). There is no rebound. Negative signs include Murphy's sign, Rovsing's sign and McBurney's sign.          Comments: Oozing blood from the right lower quadrant surgical incision site with hematoma associated.   Musculoskeletal:         General: Normal range of motion.      Cervical back: Normal range of motion and neck supple.      Right lower leg: No edema.      Left lower leg: No edema.   Skin:     General: Skin is warm and dry.      Capillary Refill: Capillary refill takes less than 2 seconds.   Neurological:      General: No focal deficit present.      Mental Status: She is alert and oriented to person, place, and time.           Impression and Management Plan   37 year old female with history of diabetes, hypertension, gastroparesis, status post bilateral salpingectomy today who came to the ED due to bleeding from the surgical site associated to diffuse abdominal pain nausea and vomiting.  Differential diagnoses postoperative pain, hematoma    Diagnostic Studies   Lab:   Results for orders placed or performed during the hospital encounter of 11/28/22   US ABDOMEN LIMITED    Narrative    US ABDOMEN LIMITED    INDICATION: RLQ hematoma. S/p Salpingectomy.    COMPARISON: Same-day ultrasound    TECHNIQUE: The examination was performed using grayscale and color Doppler.    FINDINGS:    Decreasing size of right lower quadrant hematoma, currently measuring 4.7 x 2.6  x 2.4 cm. On earlier ultrasound it measured 5.7 x 3.0 x 5.2 cm. No vascularity  associated with this collection.      Impression    IMPRESSION:    Decreasing size of right lower quadrant hematoma.    Electronically signed by: Linus Galas, DO 11/28/2022 6:53 PM EDT            Workstation ID:  [id number]     POC CHEM 8   Result Value Ref Range    Sodium 133 (L) 136 - 145 mEq/L    Potassium 4.7 3.5 - 4.9 mEq/L    Chloride 101 98 - 107 mEq/L    Total CO2 22 21 - 32 mmol/L    Glucose 359 (H) 74 - 106 mg/dL    BUN 17 7 - 25 mg/dl    Creatinine 1.1 0.6 - 1.3 mg/dl    Hematocrit 45 38 - 45 %    Hemoglobin 15.3 13.0 - 17.2 gm/dl    Calcium, Ionized 6.96 (L) 4.40 - 5.40 mg/dL   POC Glucose Fingerstick   Result Value Ref Range    POC Glucose 356 (H) 65 - 105 mg/dL  POC Glucose Fingerstick   Result Value Ref Range    POC Glucose 333 (H) 65 - 105 mg/dL       AG: 10  ED Course           EXTERNAL RECORDS REVIEWED:  I reviewed the patient's previous records here at [facility] and available outside facilities and note that patient had bilateral salpingectomy today by Dr. Mayford Knife.  During recovery patient developed right lower quadrant hematoma.    Dr. Mayford Knife at bedside to ck on pt. Hematoma/swelling site at RLQ appears to be slightly decreased. Remains noticeable and firm. Pt to be d/c home and follow up in MD office. Pt verbalized understanding.        [facility]  Operation Report  NAME:  TANA, TREFRY  SEX:   F  DATE: 11/28/2022  DOB: [birth year]  MR#    [id number]  ROOM:  PL  ACCT#  [id number]     cc: Doloris Hall MD        PREOPERATIVE DIAGNOSIS:   Undesired fertility.     POSTOPERATIVE DIAGNOSES:   1.  Undesired fertility.  2.  Pelvic adhesive disease.     PROCEDURE:   Laparoscopic bilateral salpingectomy.     SURGEON:   Fred A. Mayford Knife, MD.     ASSISTANTMaryclare Labrador Mayo, SA.     ANESTHESIA:   General endotracheal by Donella Stade, MD.      COMPLICATIONS:   None.     FINDINGS:  1.  Mesenteric adhesions to anterior abdominal wall.  2.  Normal-appearing appendix.  3.  Normal bowel surfaces.  4.  Normal uterus, tubes and ovaries.   5.  Mesenteric adhesions to the anterior abdominal wall.     INDICATIONS FOR SURGERY:   The patient is a 37 year old desirous of permanent sterilization.  She was counseled  regarding the risks of surgery including damage to internal organs, bleeding possibly requiring transfusion, pelvic infection and even death and she agreed to proceed.     DESCRIPTION OF PROCEDURE:   The patient was taken to the operating room where general anesthesia was found to be adequate.  She was placed in the dorsal lithotomy position and prepped and draped in normal sterile fashion.  A red Robinson catheter was introduced into her bladder through the urethra that produced approximately 50 mL of clear urine.  A weighted speculum was placed posteriorly and a Sims retractor placed anteriorly, exposing the anterior cervix which was grasped with a single-tooth tenaculum.  The uterus was then sounded to approximately 6 cm and a HUMI uterine manipulator was then introduced into the uterus without difficulty.  The single-tooth tenaculum was removed from the anterior cervix, inspected and noted to be hemostatic.      Attention was then turned to the abdomen where a 0.5 cm incision was made with a scalpel at the level of the umbilicus.  A Veress needle was tested for proper functioning and then introduced into this incision without difficulty.  Proper placement of the Veress needle was confirmed using the water drop test.  The abdomen was then insufflated with approximately 2.5 liters of carbon dioxide gas.  Proper placement of insufflated gas was confirmed with percussion over the liver surfaces.     The Veress needle was then removed and a 5 mm trocar was introduced through the incision without difficulty.     The trocar blade was then removed, a 5 mm laparoscope was placed through the trocar sleeve and the entire  pelvis was surveyed with the above-noted findings.     Attention was then turned to the left and right lower quadrants where 5 and 8 mm incisions were made in the right and left lower quadrants, respectively.     The patient was placed in Trendelenburg position and the left fallopian tube was elevated out  of the pelvis using laparoscopic graspers.  The Voyant device was then used to clamp, fulgurate and divide the tube from its blood supply and uterine attachment.  The operative site was inspected and noted to be hemostatic.  The right fallopian tube was then taken out of the pelvis through the 8 mm trocar sleeve.  Attention was then turned to the left fallopian tube where it was divided from its blood supply and uterine attachment in a similar fashion.  This operative site was inspected and noted to be hemostatic.  The patient was then taken out of Trendelenburg and all 3 trocar sleeves were removed under direct visualization using the laparoscope.  All the carbon dioxide gas was allowed to escape.  The skin was then closed using 4-0 Monocryl in a subcuticular fashion.  Attention was then turned vaginally as the HUMI uterine manipulator was removed from the intrauterine cavity and once again the anterior cervix was inspected and noted to be hemostatic.  All instrument and lap counts were correct times 2.     DISPOSITION:   The patient tolerated the procedure and was taken to the recovery room in good condition.     SPECIMENS:   Right and left fallopian tubes sent to pathology.        ___________________  Janice Coffin MD   Dictated ZO:XWRU A. Mayford Knife, MD  [facility]  D: 11/28/2022 11:08:59  T: 11/28/2022 12:00:40  [id number]        PREVIOUS RESULTS REVIEWED:   Results for orders placed or performed during the hospital encounter of 11/28/22   US ABDOMEN LIMITED    Narrative    US ABDOMEN LIMITED    INDICATION: RLQ hematoma. S/p Salpingectomy.    COMPARISON: Same-day ultrasound    TECHNIQUE: The examination was performed using grayscale and color Doppler.    FINDINGS:    Decreasing size of right lower quadrant hematoma, currently measuring 4.7 x 2.6  x 2.4 cm. On earlier ultrasound it measured 5.7 x 3.0 x 5.2 cm. No vascularity  associated with this collection.      Impression    IMPRESSION:    Decreasing size of right  lower quadrant hematoma.    Electronically signed by: Linus Galas, DO 11/28/2022 6:53 PM EDT            Workstation ID: [id number]     POC CHEM 8   Result Value Ref Range    Sodium 133 (L) 136 - 145 mEq/L    Potassium 4.7 3.5 - 4.9 mEq/L    Chloride 101 98 - 107 mEq/L    Total CO2 22 21 - 32 mmol/L    Glucose 359 (H) 74 - 106 mg/dL    BUN 17 7 - 25 mg/dl    Creatinine 1.1 0.6 - 1.3 mg/dl    Hematocrit 45 38 - 45 %    Hemoglobin 15.3 13.0 - 17.2 gm/dl    Calcium, Ionized 1.30 (L) 4.40 - 5.40 mg/dL   POC Glucose Fingerstick   Result Value Ref Range    POC Glucose 356 (H) 65 - 105 mg/dL   POC Glucose Fingerstick   Result Value Ref Range  POC Glucose 333 (H) 65 - 105 mg/dL            Severe exacerbation or progression of chronic illness: No      CONDITION POSING Threat to body FUNCTION, LIFE, OR LIMB without evaluation and management: Integumentary, genitourinary      Comorbidities impacting Evaluation and Management: Hypertension, diabetes        Medications   oxyCODONE (ROXICODONE) immediate release tablet 5 mg (5 mg Oral Given 11/28/22 2123)   morphine injection 4 mg (4 mg IntraVENous Given 11/28/22 1743)   ondansetron (ZOFRAN) injection 4 mg (4 mg IntraVENous Given 11/28/22 1743)   diphenhydrAMINE (BENADRYL) injection 25 mg (25 mg IntraVENous Given 11/28/22 1840)   ondansetron (ZOFRAN) injection 4 mg (4 mg IntraVENous Given 11/28/22 1840)   sodium chloride 0.9 % bolus 1,000 mL (0 mLs IntraVENous Stopped 11/29/22 0025)   insulin regular (HumuLIN R;NovoLIN R) injection 5 Units (5 Units IntraVENous Given 11/28/22 1851)   metoclopramide (REGLAN) injection 10 mg (10 mg IntraVENous Given 11/28/22 2122)   diphenhydrAMINE (BENADRYL) injection 25 mg (25 mg IntraVENous Given 11/28/22 2123)         NARRATIVE:  5:32 PM Quick clot dressing applied. Paged Dr. Mayford Knife.      5:41 PM  D/w Dr. Mayford Knife who requested to repeat the ultrasound and call him back.    7:29 PM Abdominal ultrasound shows decreasing size of hematoma.  Blood  sugar improved after IV fluids and insulin.  Patient reports feeling better after treatment.  I will discuss final disposition with Dr. Mayford Knife.    7:32 PM D/w Dr. Mayford Knife who recommended discharge home with follow-up at the office on Monday.  Patient was informed of the recommendations and agreed with the plan.    9:12 PM I was notified by the nurse that patient is still getting IV fluids but is requesting more pain medication and another dose of antiemetic medication.  I ordered Reglan, Benadryl and Roxicodone.    12:20 AM Upon reevaluation patient is without nausea or vomiting and has been sleeping.  Patient feels that she can go home.  Strict return precautions were discussed.    Medical Decision Making   Patient is comfortable in no distress and nontoxic.  No evidence of DKA, expanding hematoma, anemia.  No suspicion of acute abdomen, acute pancreatitis.  Patient will be discharged home with instructions to follow with the OB/GYN and to return to the ED if no improvement or worsening of the condition.  Patient understood and agreed     Final Diagnosis     1. Hematoma of abdominal wall, initial encounter    2. Postoperative pain    3. Hyperglycemia due to type 1 diabetes mellitus (HCC)    4. Nausea and vomiting in adult         Disposition   Discharge Home    Loa Socks, MD   November 29, 2022  12:57 AM     My signature above authenticates this document and my orders, the final    diagnosis (es), discharge prescription (s), and instructions in the Epic    record.  If you have any questions please contact [phone].     Nursing notes have been reviewed by the physician/ advanced practice    Clinician.                            Loa Socks, MD  11/29/22 [phone]

## 2022-11-28 NOTE — ED Notes (Addendum)
Dimas Aguas [phone] (pts sister)       Nickola Major, LPN  29/52/84 1324

## 2022-11-28 NOTE — Progress Notes (Signed)
Dr. Mayford Knife notified of [place] results.

## 2022-11-28 NOTE — Anesthesia Post-Procedure Evaluation (Signed)
Department of Anesthesiology  Postprocedure Note    Patient: [name] [name]  MRN: [id number]  Birthdate: 10-Jan-1986  Date of evaluation: 11/28/2022    Procedure Summary       Date: 11/28/22 Room / Location: [facility] MAIN 02 / [facility] MAIN OR    Anesthesia Start: 0927 Anesthesia Stop: 1100    Procedure: LAPAROSCOPIC BILATERAL SALPINGECTOMY (Bilateral: Abdomen) Diagnosis:       Sterilization      (Sterilization [Z30.2])    Surgeons: Janice Coffin, MD Responsible Provider: Donella Stade, MD    Anesthesia Type: General ASA Status: 3            Anesthesia Type: General    Aldrete Phase I: Aldrete Score: 10    Aldrete Phase II:      Anesthesia Post Evaluation    Patient location during evaluation: PACU  Patient participation: complete - patient participated  Level of consciousness: awake  Airway patency: patent  Nausea & Vomiting: no vomiting  Cardiovascular status: blood pressure returned to baseline  Respiratory status: acceptable  Hydration status: euvolemic  Multimodal analgesia pain management approach  Pain management: adequate    No notable events documented.

## 2022-11-28 NOTE — Other (Signed)
Patient ok to discharge per Dr. Mayford Knife. Wheeled out by PACU tech to car. PACU tech stated patient was helped into car with daughters and departed.    Surgical front desk clerical staff notified me patient returned and went to the ED. Dr. Mayford Knife called and notified.

## 2022-11-28 NOTE — Progress Notes (Signed)
100ml urine output straight cath

## 2022-11-28 NOTE — Progress Notes (Signed)
Dr. Tressia Danas notified pt urticaria after Dilaudid admin

## 2022-11-28 NOTE — ED Notes (Signed)
BP not SF appropriate      Nickola Major, LPN  21/30/86 [id number]

## 2022-11-28 NOTE — Progress Notes (Signed)
US tech at bedside now.

## 2022-11-28 NOTE — Progress Notes (Signed)
Dr. Mayford Knife at bedside to ck on pt.  Hematoma/swelling site at RLQ appears to be slightly decreased.  Remains noticeable and firm.  Pt to be d/c home and follow up in MD office.  Pt verbalized understanding.

## 2022-11-28 NOTE — Progress Notes (Signed)
Dr. Mayford Knife notified of moderate bulge on RLQ of abdomen proximal to trocar/incision site.  Tender to touch slightly firm.

## 2022-11-28 NOTE — ED Triage Notes (Signed)
Pt is s/p tubal ligation 1 hour PTA one of her sites is bleeding

## 2022-11-28 NOTE — Progress Notes (Signed)
Awaiting arrival of family to d/c pt.  Attempting to contact daughter numerous times.  Will "be there soon"

## 2022-11-28 NOTE — Progress Notes (Signed)
Dr. Tressia Danas at bedside to ck on pt.  No new orders

## 2022-11-28 NOTE — Discharge Instructions (Addendum)
Outpatient Surgery Discharge Instructions      General Anesthesia/ Sedation or Local Anesthesia    Do not drive or operate machinery for 24 hours. You are advised to go directly home from the hospital. Also do not drive until cleared by the surgeon and until not taking narcotic pain medication.  Do not consume alcohol, tranquilizers, sleeping medications or any non-prescribed medication for 24 hours.   Do not make important decisions or sign any important papers in the next 24 Hours.  You should have someone with you tonight at home.  Children may appear flushed for several hours after surgery.    Activity     Restrict your activities and rest for a day. Resume light to normal activity tomorrow. No strenuous activity No Heavy lifting No activities that will put strain on the incision sites until cleared by surgeon.    Fluids and Diet    Begin with clear liquids, bouillon, dry toast, soda crackers. If not nauseated, you may go to a regular diet when you desire. Greasy and spicy foods are not advised.  Special diet instructions: May resume normal diet as tolerated    Medications    Use all medications as directed. When taking pain medications, you may  experience dizziness or drowsiness. Do not drink alcohol or drive when taking pain medications.    You may take a non-prescription "headache" remedy type medication that you normally use, if your surgeon permits, and preferably one that does not contain aspirin.    You may resume your normal daily prescription medication schedule    Your last dose of the following medications was taken at:    Narcotic pain pill medication:  _____________________    Tylenol/Acetaminophen: _________________________    Motrin/Ibuprofen/Toradol/Celebrex: ________________    Zofran/Ondansetron: ____________________________    Prescriptions     Name of Prescriptions given: Percocet     Operative Site    Keep dressing clean and dry. May wash over incision in shower.    Dermabond  Your incision  is covered with dermabond (aka skin glue). You may shower in 24 hrs if your doctor okays it, but no tub baths/swimming. Do not scrub/rub the incision and pat the incisions dry.  Do not put any kind of ointment, cream, or lotion over the incision. Leave the skin glue on your skin until it falls off on its own, do not attempt to remove, pick at, or rub the adhesive.  If the doctor told you to use a bandage, put in a new bandage after cleaning the incisions or if the bandage gets wet or dirty.      It is important to wash your hands properly. This is the single best way to prevent the spread of infections. Hand-washing can help keep you from getting sick. It is easy, doesn't cost much, and it works.   Make sure that you and your caregivers follow safe hand-washing routines.     Extremities: Arms, Hands, Legs, Feet    Keep operative extremity elevated as much as possible to lessen swelling and discomfort.    Gynecological Procedures    No tampons, douching, tub baths, or intercourse until: Released by Dr. Cloria Spring al.  [facility] and laparoscopic patients may have varying amounts of vaginal drainage for a few days  Laparoscopic patients may develop shoulder pain in first 24 hours from residual gas    Call your doctor or come back to the emergency room if you have increasing vaginal bleeding, pass large blood clots or  saturate a peripad in one hour x2 consecutive hours.    Abdominal Surgical Procedures    Laparoscopic patients may develop shoulder pain int he first 24 hours due to residual gas in the abdomen.      Return to Work     You may return to work when cleared by the Careers adviser. Contact the surgeon's office for a work note if needed.     Call your surgeon if you have problems that concern you.  After office hours, you can reach your physician through their answering service.  IF YOU NEED IMMEDIATE ATTENTION, PLEASE GO TO THE NEAREST EMERGENCY DEPARTMENT.  Our [facility] number is  [phone]    SPECIFIC COMPLICATIONS TO WATCH FOR:     - Fever over 101 F, by mouth   - Numb, tingling, or cold fingers or toes   - Swelling around operative area   - Pain not relieved by pain medication ordered   - Increased redness, warmth, hardness, around operative area   - Blood soaked dressing (Small amount of drainage may be normal)   - Increasing and progressive drainage from surgical area or exam site   - Inability to urinate   - Uncontrolled Nausea/Vomiting

## 2022-11-29 LAB — POC GLUCOSE FINGERSTICK: POC Glucose: 333 mg/dL — ABNORMAL HIGH (ref 65–105)

## 2022-11-29 MED ORDER — OXYCODONE HCL 5 MG PO TABS
5 | ORAL | Status: DC | PRN
Start: 2022-11-29 — End: 2022-11-29
  Administered 2022-11-29: 01:00:00 5 mg via ORAL

## 2022-11-29 MED ORDER — METOCLOPRAMIDE HCL 5 MG/ML IJ SOLN
5 | INTRAMUSCULAR | Status: AC
Start: 2022-11-29 — End: 2022-11-28
  Administered 2022-11-29: 01:00:00 10 mg via INTRAVENOUS

## 2022-11-29 MED ORDER — ONDANSETRON 4 MG PO TBDP
4 | ORAL_TABLET | Freq: Three times a day (TID) | ORAL | 0 refills | Status: AC | PRN
Start: 2022-11-29 — End: 2022-12-03

## 2022-11-29 MED ORDER — DIPHENHYDRAMINE HCL 50 MG/ML IJ SOLN
50 | INTRAMUSCULAR | Status: AC
Start: 2022-11-29 — End: 2022-11-28
  Administered 2022-11-29: 01:00:00 25 mg via INTRAVENOUS

## 2022-11-29 MED FILL — OXYCODONE HCL 5 MG PO TABS: 5 MG | ORAL | Qty: 1

## 2022-11-29 MED FILL — METOCLOPRAMIDE HCL 5 MG/ML IJ SOLN: 5 MG/ML | INTRAMUSCULAR | Qty: 2

## 2022-11-29 MED FILL — DIPHENHYDRAMINE HCL 50 MG/ML IJ SOLN: 50 MG/ML | INTRAMUSCULAR | Qty: 1

## 2022-11-29 NOTE — ED Notes (Signed)
12:28 AM  11/29/22     Discharge instructions given to [name] [name] (name) with verbalization of understanding. Patient accompanied by patient.  Patient discharged with the following prescriptions      Medication List        START taking these medications      ondansetron 4 MG disintegrating tablet  Commonly known as: ZOFRAN-ODT  Take 1 tablet by mouth every 8 hours as needed for Nausea or Vomiting            ASK your doctor about these medications      amLODIPine 5 MG tablet  Commonly known as: NORVASC     FIASP PUMPCART SC     Gvoke HypoPen 2-Pack 1 MG/0.2ML Soaj  Generic drug: Glucagon     insulin glulisine 100 UNIT/ML injection pen  Commonly known as: APIDRA     Lantus SoloStar 100 UNIT/ML injection pen  Generic drug: insulin glargine     lisinopril-hydroCHLOROthiazide 10-12.5 MG per tablet  Commonly known as: PRINZIDE;ZESTORETIC     medroxyPROGESTERone 150 MG/ML injection  Commonly known as: DEPO-PROVERA     oxyCODONE-acetaminophen 5-325 MG per tablet  Commonly known as: PERCOCET  Take 1 tablet by mouth every 4 hours as needed for Pain for up to 3 days. Max Daily Amount: 6 tablets               Where to Get Your Medications        Information about where to get these medications is not yet available    Ask your nurse or doctor about these medications  ondansetron 4 MG disintegrating tablet     . Patient discharged to Home.      Lavinia Sharps, RN      Lavinia Sharps, RN  11/29/22 0028

## 2023-01-09 LAB — HEMOGLOBIN A1C
Estimated Avg Glucose, External: 209 mg/dL — ABNORMAL HIGH (ref 91–123)
Hemoglobin A1C, External: 8.9 % — ABNORMAL HIGH (ref 4.8–5.6)

## 2023-01-10 LAB — HEMOGLOBIN A1C
Estimated Avg Glucose, External: 206 mg/dL — ABNORMAL HIGH (ref 91–123)
Hemoglobin A1C, External: 8.8 % — ABNORMAL HIGH (ref 4.8–5.6)

## 2023-10-01 ENCOUNTER — Inpatient Hospital Stay: Payer: MEDICARE | Attending: Obstetrics & Gynecology | Primary: Family Medicine

## 2023-10-16 LAB — HEMOGLOBIN A1C: Hemoglobin A1C, External: 7.8 % — AB (ref 4.8–5.6)

## 2023-10-22 ENCOUNTER — Inpatient Hospital Stay: Payer: MEDICARE | Attending: Obstetrics & Gynecology | Primary: Family Medicine

## 2023-10-28 ENCOUNTER — Emergency Department: Admit: 2023-10-29 | Payer: MEDICARE | Primary: Family Medicine

## 2023-10-28 ENCOUNTER — Inpatient Hospital Stay
Admission: EM | Admit: 2023-10-28 | Discharge: 2023-11-01 | Disposition: A | Discharge status: Home / Self Care | Payer: MEDICARE | Admitting: Family Medicine

## 2023-10-28 ENCOUNTER — Emergency Department: Admit: 2023-10-28 | Payer: MEDICARE | Primary: Family Medicine

## 2023-10-28 DIAGNOSIS — I214 Non-ST elevation (NSTEMI) myocardial infarction: Principal | ICD-10-CM

## 2023-10-28 DIAGNOSIS — I161 Hypertensive emergency: Secondary | ICD-10-CM

## 2023-10-28 NOTE — ED Provider Notes (Cosign Needed)
 [facility]  Emergency Department Treatment Report        Patient: [name] [name] Age: 38 y.o. Sex: female    Date of Birth: 1985/07/15 Admit Date: 10/28/2023 PCP: [name] Josette DEL, MD   MRN: [id number]  CSN: [id number]  [name] Ozell BRAVO, MD;Jas*   Room: ER11/ER11 Time Dictated: 12:44 AM [name] [name]       Chief Complaint   Chief Complaint   Patient presents with    Fall    Loss of Consciousness       History of Present Illness   This is a 38 y.o. female with history of DKA, cyclical vomiting, hypertension, gastroparesis, type 1 diabetes who presents to the ED complaining of syncope and fall.  Patient states that she was going down the steps today and just blacked out causing her to trip and fall on her right side down 4 steps and I slid down.  She denies any her head but states that she did hurt the right side of her back.  Also is unsure why she blacked out.  States that her blood pressure has been high all week in the 190s systolic.  She has not been eating the last couple of days due to generalized abdominal pain and I am tired of hurting.  She denies any shortness of breath or vomiting or neck pain or headache or blurry vision or dizziness.  Denies anticoagulants.  Does report midsternal chest pain earlier today that has resolved.    Blood glucose average is 201 this week.    Review of Systems   Review of Systems  As per HPI    Past Medical/Surgical History     Past Medical History:   Diagnosis Date    Cyclical vomiting 07/09/2017    Diabetic ketoacidosis without coma associated with type 1 diabetes mellitus (HCC) 11/05/2015    Drug-seeking behavior     Essential hypertension 03/27/2016    Amlodipine, carvedilol, lisinopril.    Gastroparesis     Insulin  long-term use (HCC) 11/20/2016    Slow transit constipation 08/08/2017    Type 1 diabetes mellitus (HCC) 02/17/2009    Diagnosed at age 1. Had insulin  pump at one time, but developed keloids at needle insertion sites. Was supposed to establish with  [facility].     Past Surgical History:   Procedure Laterality Date    CESAREAN SECTION      PR UNLISTED PROCEDURE ABDOMEN PERITONEUM & OMENTUM  05/2015    Gastric stimulator pump remover in 2021    SALPINGECTOMY Bilateral 11/28/2022    LAPAROSCOPIC BILATERAL SALPINGECTOMY performed by [name] [name] LABOR, MD at [facility] MAIN OR       Social History     Social History     Socioeconomic History    Marital status: Single     Spouse name: Not on file    Number of children: Not on file    Years of education: Not on file    Highest education level: Not on file   Occupational History    Not on file   Tobacco Use    Smoking status: Never    Smokeless tobacco: Never   Vaping Use    Vaping status: Never Used   Substance and Sexual Activity    Alcohol use: No    Drug use: Not Currently     Types: Marijuana Oda)    Sexual activity: Not on file   Other Topics Concern    Not on file  Social History Narrative    Not on file     Social Drivers of Health     Financial Resource Strain: Not on file   Food Insecurity: No Food Insecurity (01/09/2023)    Received from [facility]    Hunger Vital Sign     Within the past 12 months, you worried that your food would run out before you got the money to buy more.: Never true     Within the past 12 months, the food you bought just didn't last and you didn't have money to get more.: Never true   Transportation Needs: No Transportation Needs (01/09/2023)    Received from [facility] - Transportation     Lack of Transportation (Medical): No     Lack of Transportation (Non-Medical): No   Physical Activity: Not on file   Stress: Not on file   Social Connections: Not on file   Intimate Partner Violence: Not At Risk (01/09/2023)    Received from [facility]    Humiliation, Afraid, Rape, and Kick questionnaire     Within the last year, have you been afraid of your partner or ex-partner?: No     Within the last year, have you been humiliated or emotionally abused in other ways by  your partner or ex-partner?: No     Within the last year, have you been kicked, hit, slapped, or otherwise physically hurt by your partner or ex-partner?: No     Within the last year, have you been raped or forced to have any kind of sexual activity by your partner or ex-partner?: No   Housing Stability: Low Risk  (01/09/2023)    Received from [facility] Stability Vital Sign     In the last 12 months, was there a time when you were not able to pay the mortgage or rent on time?: No     In the past 12 months, how many times have you moved where you were living?: 0     At any time in the past 12 months, were you homeless or living in a shelter (including now)?: No       Family History     Family History   Problem Relation Age of Onset    Stroke Other         great grandmother    Diabetes Father        Current Medications     Prior to Admission Medications   Prescriptions Last Dose Informant Patient Reported? Taking?   Glucagon (GVOKE HYPOPEN 2-PACK) 1 MG/0.2ML SOAJ   Yes No   Sig: AS DIRECTED IN THE EVENT OF UNCONCIOUS HYPOGLYCEMIA DX E10.65   Insulin  Aspart, w/Niacinamide, (FIASP PUMPCART SC)   Yes No   Sig: Inject into the skin   amLODIPine (NORVASC) 5 MG tablet   Yes No   insulin  glargine (LANTUS SOLOSTAR) 100 UNIT/ML injection pen   Yes No   Sig: Inject 28 Units into the skin every evening   insulin  glulisine (APIDRA) 100 UNIT/ML injection pen   Yes No   Sig: Inject into the skin   lisinopril-hydroCHLOROthiazide (PRINZIDE;ZESTORETIC) 10-12.5 MG per tablet   Yes No   Sig: Take 1 tablet by mouth daily   medroxyPROGESTERone (DEPO-PROVERA) 150 MG/ML injection   Yes No   Sig: Inject 1 mL into the muscle   oxyCODONE -acetaminophen  (PERCOCET) 5-325 MG per tablet   No No   Sig: Take 1 tablet by mouth  every 4 hours as needed for Pain for up to 3 days. Max Daily Amount: 6 tablets      Facility-Administered Medications: None       Allergies     Allergies   Allergen Reactions    Methocarbamol Other (See Comments)      Body shaking, nausea    Azithromycin Hives    Penicillins Hives       Physical Exam     ED Triage Vitals   Encounter Vitals Group      BP 10/28/23 1721 (!) 185/112      Girls Systolic BP Percentile --       Girls Diastolic BP Percentile --       Boys Systolic BP Percentile --       Boys Diastolic BP Percentile --       Pulse 10/28/23 1721 99      Respirations 10/28/23 1721 20      Temp 10/28/23 1721 98.8 F (37.1 C)      Temp Source 10/28/23 1721 Temporal      SpO2 10/28/23 1721 100 %      Weight - Scale 10/28/23 1739 72.6 kg (160 lb)      Height 10/28/23 1739 1.651 m (5' 5)      Head Circumference --       Peak Flow --       Pain Score --       Pain Loc --       Pain Education --       Exclude from Growth Chart --      Physical Exam  Vitals reviewed.   Constitutional:       Appearance: She is normal weight. She is ill-appearing (chronically).   HENT:      Mouth/Throat:      Mouth: Mucous membranes are dry.   Cardiovascular:      Rate and Rhythm: Normal rate and regular rhythm.      Pulses: Normal pulses.      Heart sounds: Normal heart sounds.   Pulmonary:      Effort: Pulmonary effort is normal.      Breath sounds: Normal breath sounds.   Abdominal:      General: There is no distension.      Palpations: Abdomen is soft.      Tenderness: There is abdominal tenderness (generalized). There is no guarding.   Musculoskeletal:      Right lower leg: No edema.      Left lower leg: No edema.      Comments: Back: No C-spine tenderness.  Mild tenderness to upper lumbar spine mostly to right side thoracic,lumbar back.  Worse with truncal rotation and flexion.  No obvious step-offs or deformities.  Motor strength 5 out of 5 bilateral upper and lower extremities with sensation intact throughout.   Neurological:      General: No focal deficit present.      Motor: No weakness.      Coordination: Coordination normal.           Impression and Management Plan   38 year old female with history of type 1 diabetes, gastroparesis,  hypertension presents to the ED complaining of right mid back pain after a fall and syncopal episode.  States that she blacked out, fell down onto 4 steps.  Reports pain with movement of back.  Denies hitting her head.  No neck pain.  Elevated blood pressure in the ED and patient states she took her antihypertensives  this morning.  Also reports chest pain earlier today.  Will obtain basic labs as well as troponin, CT head and reassess.  X-ray studies were ordered prior to my evaluation.  Will treat symptomatically.    Differential Diagnosis: Ddx is but not limited to: Muscular strain, contusion, fracture, dehydration, symptomatic hypertension, ACS/CAD/MI, ICH    Procedures    Diagnostic Studies   Lab:   Labs Reviewed   CBC WITH AUTO DIFFERENTIAL - Abnormal; Notable for the following components:       Result Value    WBC 11.9 (*)     All other components within normal limits   COMPREHENSIVE METABOLIC PANEL - Abnormal; Notable for the following components:    Potassium 3.4 (*)     Sodium 135 (*)     Glucose 203 (*)     Creatinine 1.46 (*)     ALT <7 (*)     All other components within normal limits   TROPONIN - Abnormal; Notable for the following components:    Troponin, High Sensitivity 122 (*)     All other components within normal limits   PROBNP, N-TERMINAL - Abnormal; Notable for the following components:    NT Pro-BNP 1,771.0 (*)     All other components within normal limits   T4, FREE   TSH   APTT   TROPONIN   TROPONIN   APTT   APTT   MAGNESIUM       Results for orders placed or performed during the hospital encounter of 10/28/23   XR CHEST (2 VW)    Narrative    INTERPRETATION:  XR CHEST (2 VW).    INDICATION: rib pain.    The cardiac silhouette is normal in size. The mediastinal contour is  unremarkable. There is no parenchymal consolidation, pleural effusion or  pneumothorax. Visualized osseous structures are unremarkable.      Impression    IMPRESSION:  No evidence for an acute cardiopulmonary  process.    Electronically signed by: Alm Smiles, MD 10/28/2023 6:57 PM EDT            Workstation ID: [id number]     XR LUMBAR SPINE (2-3 VIEWS)    Narrative    HISTORY: Fall pain    3 images    No gross fracture or dislocation      Impression    IMPRESSION: No acute osseous injury    Electronically signed by: Alm Smiles, MD 10/28/2023 6:55 PM EDT            Workstation ID: [id number]     CT Head W/O Contrast    Narrative    Examination: CT head without contrast.    INDICATION: syncopal episode    COMPARISON: 2019.     TECHNIQUE: CT head without contrast. DICOM format image data is available to  non-affiliated external healthcare facilities or entities on a secure, media  free, reciprocally searchable basis with patient authorization for 12 months  following the date of the study.    All CT exams at this facility use one or more dose reduction techniques  including automatic exposure control, mA/kV adjustment per patient's size, or  iterative reconstruction technique.    WORKSTATION ID: [id number]    FINDINGS:    No focal mass, midline shift or acute intracranial hemorrhage.    The ventricles, cisterns and other CSF-containing spaces are normal in size,  shape and position for the patient's age. Mild scattered chronic small vessel  ischemic change. Small chronic infarct  involving the right caudate, extending to  the right gangliocapsular region.    Paranasal sinuses, mastoid air cells and middle ear cavities grossly  unremarkable.      Impression    IMPRESSION:  1. No acute intracranial abnormalities.  2. 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    Electronically signed by: Chevis Kid, MD 10/29/2023 12:03 AM EDT            Workstation ID: [id number]     CTA Chest Pulmonary Embolism W Contrast    Narrative    Examination:  CTA Chest with contrast.     INDICATION: Chest Pain    COMPARISON: 11/01/2013.    WORKSTATION ID: [id number]    TECHNIQUE: CTA of  the chest was performed following nonionic intravenous  contrast administration. Multiplanar 3-dimensional maximum intensity projection  reconstructions were performed and reviewed.    DICOM format image data is available to non-affiliated external healthcare  facilities or entities on a secure, media free, reciprocally searchable basis  with patient authorization for 12 months following the date of the study    All CT exams at this facility use one or more dose reduction techniques  including automatic exposure control, mA/kV adjustment per patient's size, or  iterative reconstruction technique.    FINDINGS: No filling defects within the pulmonary arteries. No evidence of  aneurysmal dilatation or dissection of the aorta. Heart normal size. No  significant lymphadenopathy. Lungs clear. Bones intact.      Impression    IMPRESSION:.  1. No pulmonary emboli.  2. No acute abnormalities.    Electronically signed by: Chevis Kid, MD 10/29/2023 12:09 AM EDT            Workstation ID: [id number]     CBC with Auto Differential   Result Value Ref Range    WBC 11.9 (H) 4.0 - 11.0 1000/mm3    RBC 4.25 3.60 - 5.20 M/uL    Hemoglobin 12.1 11.0 - 16.0 gm/dl    Hematocrit 64.3 64.9 - 47.0 %    MCV 83.8 80.0 - 98.0 fL    MCH 28.5 25.4 - 34.6 pg    MCHC 34.0 30.0 - 36.0 gm/dl    Platelets 711 859 - 450 1000/mm3    MPV 13.4 6.0 - 10.0 fL    RDW 40.7 36.4 - 46.3      Nucleated RBCs 0 0 - 0      Immature Granulocytes % 0.2 0.0 - 3.0 %    Neutrophils Segmented 60.8 34 - 64 %    Lymphocytes 31.6 28 - 48 %    Monocytes 6.6 1 - 13 %    Eosinophils 0.2 0 - 5 %    Basophils 0.6 0 - 3 %   CMP   Result Value Ref Range    Potassium 3.4 (L) 3.5 - 5.1 mEq/L    Chloride 102 98 - 107 mEq/L    Sodium 135 (L) 136 - 145 mEq/L    CO2 24 20 - 31 mEq/L    Glucose 203 (H) 74 - 106 mg/dl    BUN 12 9 - 23 mg/dl    Creatinine 8.53 (H) 0.55 - 1.02 mg/dl    GFR African American 52.0      GFR Non-African American 43      Calcium 9.2 8.7 - 10.4 mg/dl     Anion Gap 9 5 - 15 mmol/L    AST 15.0 0.0 - 33.9 U/L  ALT <7 (L) 10 - 49 U/L    Alkaline Phosphatase 75 46 - 116 U/L    Total Bilirubin 0.90 0.30 - 1.20 mg/dl    Total Protein 7.3 5.7 - 8.2 gm/dl    Albumin  4.1 3.4 - 5.0 gm/dl   Troponin   Result Value Ref Range    Troponin, High Sensitivity 122 (HH) 0 - 34 ng/L   proBNP, N-TERMINAL   Result Value Ref Range    NT Pro-BNP 1,771.0 (H) 0.0 - 125.0 pg/ml   T4, Free   Result Value Ref Range    T4 Free 1.59 0.89 - 1.76 ng/dl   TSH   Result Value Ref Range    TSH, High Sensitivity 1.46 0.55 - 4.78 uIU/mL   APTT   Result Value Ref Range    aPTT 29.8 25.1 - 36.5 seconds     ECG: Interpreted by myself and [name] Ozell BRAVO, MD. My interpretation is normal sinus rhythm with ventricular rate 100 bpm no acute ST-T wave changes, prolonged QT.    Imaging studies: Independently interpreted by myself and [name] Ozell BRAVO, MD. My interpretation is chest x-ray and x-ray lumbar spine show no bony abnormalities.  Chest CTA shows no pulmonary embolism.  CT head shows no acute intracranial abnormalities.    Medical Decision Making/ ED Course         NARRATIVE: CBC shows minimal leukocytosis 11.9, no anemia.  CMP shows electrolytes within normal limits.  Mildly elevated glucose of 203 which patient states is her averages.  Creatinine 1.46 also consistent with her recent averages.  Normal LFTs.  Troponin critically elevated at 122.  Review of patient's charts from here and [facility], the last troponin that was obtained was in 2024 which was negative.  Patient denies any chest pain at this time but states that when she takes a deep breath she intermittently has midsternal chest pain that is sharp.  Will obtain CTA chest to rule out PE.  Blood pressure continues to be elevated and she is mildly tachycardic.  Heparin ordered as well as IV antihypertensives.     CT head and CTA chest showed no acute abnormalities.  No pulmonary embolism.  Patient's blood pressure continues to be elevated now  currently 213/107.  Pulse has reduced to 88.  Will start patient on Cardene drip.  Awaiting cardiology and hospitalist consultation.    Consult:  Discussed care with Dr. Sherill, Specialty: Cardiology standard discussion; including history of patient's chief complaint, available diagnostic results, and treatment course.  Agrees with plan and cardiology will see in the morning.  12:44 AM, [name] JONELLE Chute, PA-C     Consult:  Discussed care with Dr. Mathias Goodrich, Specialty: Hospitalist standard discussion; including history of patient's chief complaint, available diagnostic results, and treatment course.  Will admit patient to stepdown.  12:45 AM, [name] JONELLE Chute, PA-C       INTERNAL/EXTERNAL RECORDS REVIEWED: I reviewed the patient's previous records here at [facility] and available outside facilities and note that patient was seen in [facility] ED 10/22/2023 with the following MDM and ED course:  Final diagnoses:   [R11.2] Nausea and vomiting, unspecified vomiting type (Primary)   [R10.13] Abdominal pain, epigastric     Medical Decision Making:     Differential/Questionable Diagnosis:   Patient presents with lower abdominal pain.    Differential diagnosis considered includes: appendicitis, nephroureterolithiasis, pyelonephritis, cystitis, constipation, ovarian cyst, intrauterine pregnancy, and ectopic pregnancy. Also considered, but unlikely given history and exam: small bowel  obstruction, intestinal volvulus, intussusception, hernia, ovarian torsion, PID.    Due to the patient's presentation, a broad based workup including CBC, CMP, lipase, urinalysis, UPT, and pelvic ultrasound was obtained for evaluation. Medical records, nursing notes, and vital signs reviewed.    ED Course:   Patient was seen by myself in the emergency department. Laboratory results were largely unremarkable. Creatinine of 1.4 which is up from baseline of 1.2. Mild AKI. Slight leukocytosis as well. Urinalysis showed 21-50 red  blood cells but no signs of infection. Urinalysis and pregnancy test were negative. EKG showed normal sinus rhythm without ST elevation or ischemic changes. No significant change from prior tracing. Based on pelvic exam, patient did have right adnexal and cervical motion tenderness. Pelvic ultrasound pending. Patient received Zofran , Dilaudid  and IV fluids.    Severe exacerbation or progression of chronic illness: Persistent hypertension.     Threat to body function without evaluation and management: Cardiovascular, neuro, MSK    Social Determinants impacting E&M: Overcrowding in the ED, access to care, health literacy    Comorbidities impacting Evaluation and Management: Type 1 diabetes    Critical Care Time: Critical Care Time  Critical care time excluding procedures, but including direct patient care, reviewing medical records, evaluating results of diagnostic testing, discussions with family members, and consulting with physicians:37 minutes    Additional Providers Consulted: Cardiology, hospitalist    Final Diagnosis       ICD-10-CM    1. NSTEMI (non-ST elevated myocardial infarction) (HCC)  I21.4       2. Syncope and collapse  R55       3. Acute right-sided low back pain, unspecified whether sciatica present  M54.50       4. Elevated troponin  R79.89       5. Hypertension, unspecified type  I10             Disposition     Disposition and plan:  Patient was admitted to the hospital for NSTEMI, blood pressure elevation, syncopal episode.       Medication List        ASK your doctor about these medications      amLODIPine 5 MG tablet  Commonly known as: NORVASC     FIASP PUMPCART SC     Gvoke HypoPen 2-Pack 1 MG/0.2ML Soaj  Generic drug: Glucagon     insulin  glulisine 100 UNIT/ML injection pen  Commonly known as: APIDRA     Lantus SoloStar 100 UNIT/ML injection pen  Generic drug: insulin  glargine     lisinopril-hydroCHLOROthiazide 10-12.5 MG per tablet  Commonly known as: PRINZIDE;ZESTORETIC      medroxyPROGESTERone 150 MG/ML injection  Commonly known as: DEPO-PROVERA     oxyCODONE -acetaminophen  5-325 MG per tablet  Commonly known as: PERCOCET  Take 1 tablet by mouth every 4 hours as needed for Pain for up to 3 days. Max Daily Amount: 6 tablets                The patient was personally evaluated by myself and discussed with [name] Ozell BRAVO, MD;Jas* who agrees with the above assessment and plan.    Dragon medical dictation software was used for portions of this report. Unintended errors may occur.     [name] JONELLE Chute, PA-C  October 29, 2023    My signature above authenticates this document and my orders, the final    diagnosis (es), discharge prescription (s), and instructions in the Epic    record.  If you have any  questions please contact [phone].     Nursing notes have been reviewed by the physician/ advanced practice    Clinician.       Kelsa Jaworowski R, PA-C  10/29/23 [phone]

## 2023-10-28 NOTE — ED Notes (Signed)
 Report given to Gerlene, RN.      Eliza Jakob, RN  10/28/23 [phone]

## 2023-10-28 NOTE — ED Notes (Signed)
 Patient ambulated to bathroom with a steady gait. Patient transported to CT by transport team at this time.      Eliza Jakob, RN  10/28/23 [phone]

## 2023-10-28 NOTE — ED Triage Notes (Signed)
 Patient arrived via [employer]   EMS Interventions: N/A    Mechanism: slip and fall  Patient fell from standing, walking down stairs  Ground Level:No  Height: < 5 ft  Narrative Patient with complaint of back pain/right side pain s/p slip and fall around 4pm today. Was walking down steps, slipped and fell onto side, slid down 4 steps. Denies hitting head but believes she may have had LOC, recent HTN and lightheadedness contributing to fall   LOC: Unknown  Blood Thinners: No    Chief Complaint: fall, back and side pain  C/spine Tenderness: No   C-collar indicated and applied: NO    GCS on Arrival: 15    Event Narrative: see above

## 2023-10-29 LAB — EKG 12-LEAD
Atrial Rate: 100 {beats}/min
Calculated P Axis: 72 degrees
Calculated R Axis: 83 degrees
Calculated T Axis: -80 degrees
DIAGNOSIS, 93000: NORMAL
P-R Interval: 118 ms
Q-T Interval: 358 ms
QRS Duration: 86 ms
QTC Calculation (Bezet): 461 ms
Ventricular Rate: 100 {beats}/min

## 2023-10-29 LAB — COMPREHENSIVE METABOLIC PANEL
ALT: <7 U/L — ABNORMAL LOW (ref 10–49)
AST: 15 U/L (ref 0.0–33.9)
Albumin: 4.1 g/dL (ref 3.4–5.0)
Alkaline Phosphatase: 75 U/L (ref 46–116)
Anion Gap: 9 mmol/L (ref 5–15)
BUN: 12 mg/dL (ref 9–23)
CO2: 24 meq/L (ref 20–31)
Calcium: 9.2 mg/dL (ref 8.7–10.4)
Chloride: 102 meq/L (ref 98–107)
Creatinine: 1.46 mg/dL — ABNORMAL HIGH (ref 0.55–1.02)
GFR African American: 52
GFR Non-African American: 43
Glucose: 203 mg/dL — ABNORMAL HIGH (ref 74–106)
Potassium: 3.4 meq/L — ABNORMAL LOW (ref 3.5–5.1)
Sodium: 135 meq/L — ABNORMAL LOW (ref 136–145)
Total Bilirubin: 0.9 mg/dL (ref 0.30–1.20)
Total Protein: 7.3 g/dL (ref 5.7–8.2)

## 2023-10-29 LAB — CBC WITH AUTO DIFFERENTIAL
Basophils: 0.6 % (ref 0–3)
Eosinophils: 0.2 % (ref 0–5)
Hematocrit: 35.6 % (ref 35.0–47.0)
Hemoglobin: 12.1 g/dL (ref 11.0–16.0)
Immature Granulocytes %: 0.2 % (ref 0.0–3.0)
Lymphocytes: 31.6 % (ref 28–48)
MCH: 28.5 pg (ref 25.4–34.6)
MCHC: 34 g/dL (ref 30.0–36.0)
MCV: 83.8 fL (ref 80.0–98.0)
MPV: 13.4 fL (ref 6.0–10.0)
Monocytes: 6.6 % (ref 1–13)
Neutrophils Segmented: 60.8 % (ref 34–64)
Nucleated RBCs: 0 (ref 0–0)
Platelets: 288 1000/mm3 (ref 140–450)
RBC: 4.25 M/uL (ref 3.60–5.20)
RDW: 40.7 (ref 36.4–46.3)
WBC: 11.9 1000/mm3 — ABNORMAL HIGH (ref 4.0–11.0)

## 2023-10-29 LAB — POCT GLUCOSE
POC Glucose: 236 mg/dL — ABNORMAL HIGH (ref 65–105)
POC Glucose: 133 mg/dL — ABNORMAL HIGH (ref 65–105)
POC Glucose: 435 mg/dL (ref 65–105)
POC Glucose: 516 mg/dL (ref 65–105)

## 2023-10-29 LAB — TROPONIN
Troponin, High Sensitivity: 122 ng/L (ref 0–34)
Troponin, High Sensitivity: 101 ng/L (ref 0–34)
Troponin, High Sensitivity: 114 ng/L (ref 0–34)

## 2023-10-29 LAB — PROBNP, N-TERMINAL: NT Pro-BNP: 1771 pg/mL — ABNORMAL HIGH (ref 0.0–125.0)

## 2023-10-29 LAB — APTT
aPTT: 29.8 s (ref 25.1–36.5)
aPTT: 183 s (ref 25.1–36.5)
aPTT: 27.8 s (ref 25.1–36.5)
aPTT: >250 s (ref 25.1–36.5)

## 2023-10-29 LAB — T4, FREE: T4 Free: 1.59 ng/dL (ref 0.89–1.76)

## 2023-10-29 LAB — TSH: TSH, High Sensitivity: 1.46 u[IU]/mL (ref 0.55–4.78)

## 2023-10-29 LAB — MAGNESIUM: Magnesium: 1.6 mg/dL (ref 1.6–2.6)

## 2023-10-29 MED ORDER — HEPARIN SODIUM (PORCINE) 1000 UNIT/ML IJ SOLN
1000 | INTRAMUSCULAR | Status: DC | PRN
Start: 2023-10-29 — End: 2023-10-30
  Administered 2023-10-29: 20:00:00 4000 [IU] via INTRAVENOUS

## 2023-10-29 MED ORDER — LISINOPRIL 20 MG PO TABS
20 | Freq: Every day | ORAL | Status: DC
Start: 2023-10-29 — End: 2023-11-01
  Administered 2023-10-29 – 2023-10-30 (×2): 20 mg via ORAL

## 2023-10-29 MED ORDER — POLYETHYLENE GLYCOL 3350 17 G PO PACK
17 | Freq: Every day | ORAL | Status: DC | PRN
Start: 2023-10-29 — End: 2023-11-01
  Administered 2023-10-30: 23:00:00 17 g via ORAL

## 2023-10-29 MED ORDER — MORPHINE SULFATE (PF) 2 MG/ML IV SOLN
2 | INTRAVENOUS | Status: DC | PRN
Start: 2023-10-29 — End: 2023-11-01
  Administered 2023-10-29 – 2023-11-01 (×10): 2 mg via INTRAVENOUS

## 2023-10-29 MED ORDER — SODIUM CHLORIDE 0.9 % IV SOLN
0.9 | INTRAVENOUS | Status: DC | PRN
Start: 2023-10-29 — End: 2023-11-01

## 2023-10-29 MED ORDER — INSULIN LISPRO 100 UNIT/ML IJ SOLN
100 | Freq: Four times a day (QID) | INTRAMUSCULAR | Status: DC
Start: 2023-10-29 — End: 2023-10-30
  Administered 2023-10-29: 23:00:00 4 [IU] via SUBCUTANEOUS
  Administered 2023-10-29: 13:00:00 1 [IU] via SUBCUTANEOUS
  Administered 2023-10-30: 10:00:00 5 [IU] via SUBCUTANEOUS

## 2023-10-29 MED ORDER — HEPARIN SODIUM (PORCINE) 1000 UNIT/ML IJ SOLN
1000 | Freq: Once | INTRAMUSCULAR | Status: AC
Start: 2023-10-29 — End: 2023-10-28
  Administered 2023-10-29: 03:00:00 4000 [IU] via INTRAVENOUS

## 2023-10-29 MED ORDER — NORMAL SALINE FLUSH 0.9 % IV SOLN
0.9 | Freq: Two times a day (BID) | INTRAVENOUS | Status: DC
Start: 2023-10-29 — End: 2023-11-01
  Administered 2023-10-29 – 2023-11-01 (×6): 10 mL via INTRAVENOUS

## 2023-10-29 MED ORDER — DEXTROSE 50 % IV SOLN
50 | INTRAVENOUS | Status: DC | PRN
Start: 2023-10-29 — End: 2023-10-31

## 2023-10-29 MED ORDER — INSULIN GLARGINE 100 UNIT/ML SC SOLN
100 | Freq: Every evening | SUBCUTANEOUS | Status: DC
Start: 2023-10-29 — End: 2023-10-30
  Administered 2023-10-30: 03:00:00 11 [IU] via SUBCUTANEOUS

## 2023-10-29 MED ORDER — ONDANSETRON 4 MG PO TBDP
4 | Freq: Three times a day (TID) | ORAL | Status: DC | PRN
Start: 2023-10-29 — End: 2023-11-01

## 2023-10-29 MED ORDER — OXYCODONE-ACETAMINOPHEN 5-325 MG PO TABS
5-325 | ORAL | Status: DC
Start: 2023-10-29 — End: 2023-10-28

## 2023-10-29 MED ORDER — DIPHENHYDRAMINE HCL 50 MG/ML IJ SOLN
50 | INTRAMUSCULAR | Status: AC
Start: 2023-10-29 — End: 2023-10-29
  Administered 2023-10-29: 06:00:00 12.5 mg via INTRAVENOUS

## 2023-10-29 MED ORDER — AMLODIPINE BESYLATE 5 MG PO TABS
5 | Freq: Every day | ORAL | Status: DC
Start: 2023-10-29 — End: 2023-11-01
  Administered 2023-10-29 – 2023-11-01 (×4): 10 mg via ORAL

## 2023-10-29 MED ORDER — MORPHINE SULFATE (PF) 2 MG/ML IV SOLN
2 | INTRAVENOUS | Status: AC
Start: 2023-10-29 — End: 2023-10-28
  Administered 2023-10-29: 02:00:00 2 mg via INTRAVENOUS

## 2023-10-29 MED ORDER — SODIUM CHLORIDE 0.9 % IV SOLN
0.9 | INTRAVENOUS | Status: DC
Start: 2023-10-29 — End: 2023-11-01
  Administered 2023-10-30 – 2023-11-01 (×5): via INTRAVENOUS

## 2023-10-29 MED ORDER — INSULIN LISPRO 100 UNIT/ML IJ SOLN
100 | INTRAMUSCULAR | Status: DC | PRN
Start: 2023-10-29 — End: 2023-10-31

## 2023-10-29 MED ORDER — NICARDIPINE HCL IN NACL 20-0.9 MG/200ML-% IV SOLN
20-0.9 | INTRAVENOUS | Status: DC
Start: 2023-10-29 — End: 2023-10-31
  Administered 2023-10-29: 13:00:00 10 mg/h via INTRAVENOUS
  Administered 2023-10-29 (×2): 5 mg/h via INTRAVENOUS
  Administered 2023-10-29: 16:00:00 7.5 mg/h via INTRAVENOUS

## 2023-10-29 MED ORDER — SODIUM CHLORIDE 0.9 % IV BOLUS
0.9 | Freq: Once | INTRAVENOUS | Status: AC
Start: 2023-10-29 — End: 2023-10-29
  Administered 2023-10-29: 02:00:00 1000 mL via INTRAVENOUS

## 2023-10-29 MED ORDER — OXYCODONE-ACETAMINOPHEN 5-325 MG PO TABS
5-325 | ORAL | Status: DC | PRN
Start: 2023-10-29 — End: 2023-11-01

## 2023-10-29 MED ORDER — ACETAMINOPHEN 650 MG RE SUPP
650 | Freq: Four times a day (QID) | RECTAL | Status: DC | PRN
Start: 2023-10-29 — End: 2023-11-01

## 2023-10-29 MED ORDER — HEPARIN SODIUM (PORCINE) 1000 UNIT/ML IJ SOLN
1000 | INTRAMUSCULAR | Status: DC | PRN
Start: 2023-10-29 — End: 2023-10-30

## 2023-10-29 MED ORDER — NORMAL SALINE FLUSH 0.9 % IV SOLN
0.9 | INTRAVENOUS | Status: DC | PRN
Start: 2023-10-29 — End: 2023-11-01

## 2023-10-29 MED ORDER — DIPHENHYDRAMINE HCL 50 MG/ML IJ SOLN
50 | Freq: Four times a day (QID) | INTRAMUSCULAR | Status: DC | PRN
Start: 2023-10-29 — End: 2023-11-01
  Administered 2023-10-29 – 2023-11-01 (×10): 25 mg via INTRAVENOUS

## 2023-10-29 MED ORDER — POTASSIUM CHLORIDE CRYS ER 20 MEQ PO TBCR
20 | ORAL | Status: DC | PRN
Start: 2023-10-29 — End: 2023-11-01
  Administered 2023-10-30: 01:00:00 40 meq via ORAL

## 2023-10-29 MED ORDER — HYDROCHLOROTHIAZIDE 25 MG PO TABS
25 | Freq: Every day | ORAL | Status: DC
Start: 2023-10-29 — End: 2023-11-01
  Administered 2023-10-29 – 2023-10-30 (×2): 12.5 mg via ORAL

## 2023-10-29 MED ORDER — ONDANSETRON HCL 4 MG/2ML IJ SOLN
4 | Freq: Four times a day (QID) | INTRAMUSCULAR | Status: DC | PRN
Start: 2023-10-29 — End: 2023-11-01
  Administered 2023-10-29: 12:00:00 4 mg via INTRAVENOUS

## 2023-10-29 MED ORDER — MAGNESIUM SULFATE 2000 MG/50 ML IVPB PREMIX
2 | INTRAVENOUS | Status: DC | PRN
Start: 2023-10-29 — End: 2023-11-01

## 2023-10-29 MED ORDER — METOPROLOL TARTRATE 5 MG/5ML IV SOLN
5 | Freq: Once | INTRAVENOUS | Status: AC
Start: 2023-10-29 — End: 2023-10-28
  Administered 2023-10-29: 03:00:00 5 mg via INTRAVENOUS

## 2023-10-29 MED ORDER — DIPHENHYDRAMINE HCL 50 MG/ML IJ SOLN
50 | INTRAMUSCULAR | Status: AC
Start: 2023-10-29 — End: 2023-10-28
  Administered 2023-10-29: 02:00:00 25 mg via INTRAVENOUS

## 2023-10-29 MED ORDER — IOPAMIDOL 76 % IV SOLN
76 | Freq: Once | INTRAVENOUS | Status: AC | PRN
Start: 2023-10-29 — End: 2023-10-28
  Administered 2023-10-29: 04:00:00 70 mL via INTRAVENOUS

## 2023-10-29 MED ORDER — HEPARIN SOD (PORCINE) IN D5W 100 UNIT/ML IV SOLN
100 | INTRAVENOUS | Status: DC
Start: 2023-10-29 — End: 2023-10-30
  Administered 2023-10-29: 03:00:00 12 [IU]/kg/h via INTRAVENOUS
  Administered 2023-10-30: 01:00:00 10 [IU]/kg/h via INTRAVENOUS

## 2023-10-29 MED ORDER — ACETAMINOPHEN 10 MG/ML IV SOLN
10 | Freq: Four times a day (QID) | INTRAVENOUS | Status: AC | PRN
Start: 2023-10-29 — End: 2023-10-29
  Administered 2023-10-29: 09:00:00 1000 mg via INTRAVENOUS

## 2023-10-29 MED ORDER — POTASSIUM CHLORIDE 20 MEQ/15ML (10%) PO SOLN
20 | ORAL | Status: DC | PRN
Start: 2023-10-29 — End: 2023-11-01

## 2023-10-29 MED ORDER — GLUCAGON (RDNA) 1 MG IJ KIT
1 | INTRAMUSCULAR | Status: DC | PRN
Start: 2023-10-29 — End: 2023-10-31

## 2023-10-29 MED ORDER — POTASSIUM CHLORIDE 10 MEQ/100ML IV SOLN
10 | INTRAVENOUS | Status: DC | PRN
Start: 2023-10-29 — End: 2023-11-01

## 2023-10-29 MED ORDER — ACETAMINOPHEN 325 MG PO TABS
325 | Freq: Four times a day (QID) | ORAL | Status: DC | PRN
Start: 2023-10-29 — End: 2023-11-01

## 2023-10-29 MED ORDER — MORPHINE SULFATE (PF) 2 MG/ML IV SOLN
2 | INTRAVENOUS | Status: AC
Start: 2023-10-29 — End: 2023-10-29
  Administered 2023-10-29: 06:00:00 2 mg via INTRAVENOUS

## 2023-10-29 MED ORDER — POTASSIUM CHLORIDE 10 MEQ/100ML IV SOLN
10 | INTRAVENOUS | Status: AC
Start: 2023-10-29 — End: 2023-10-29

## 2023-10-29 MED FILL — MORPHINE SULFATE 2 MG/ML IJ SOLN: 2 mg/mL | INTRAMUSCULAR | Qty: 1 | Fill #0

## 2023-10-29 MED FILL — AMLODIPINE BESYLATE 5 MG PO TABS: 5 mg | ORAL | Qty: 2 | Fill #0

## 2023-10-29 MED FILL — HYDROCHLOROTHIAZIDE 25 MG PO TABS: 25 mg | ORAL | Qty: 1 | Fill #0

## 2023-10-29 MED FILL — LISINOPRIL 20 MG PO TABS: 20 mg | ORAL | Qty: 1 | Fill #0

## 2023-10-29 MED FILL — NICARDIPINE HCL IN NACL 20-0.9 MG/200ML-% IV SOLN: 20-0.9 MG/200ML-% | INTRAVENOUS | Qty: 200 | Fill #0

## 2023-10-29 MED FILL — ISOVUE-370 76 % IV SOLN: 76 % | INTRAVENOUS | Qty: 80 | Fill #0

## 2023-10-29 MED FILL — DIPHENHYDRAMINE HCL 50 MG/ML IJ SOLN: 50 mg/mL | INTRAMUSCULAR | Qty: 1 | Fill #0

## 2023-10-29 MED FILL — HEPARIN SODIUM (PORCINE) 1000 UNIT/ML IJ SOLN: 1000 [IU]/mL | INTRAMUSCULAR | Qty: 40 | Fill #0

## 2023-10-29 MED FILL — ONDANSETRON HCL 4 MG/2ML IJ SOLN: 4 MG/2ML | INTRAMUSCULAR | Qty: 2 | Fill #0

## 2023-10-29 MED FILL — ADMELOG 100 UNIT/ML IJ SOLN: 100 [IU]/mL | INTRAMUSCULAR | Qty: 4 | Fill #0

## 2023-10-29 MED FILL — OXYCODONE-ACETAMINOPHEN 5-325 MG PO TABS: 5-325 mg | ORAL | Qty: 1 | Fill #0

## 2023-10-29 MED FILL — HEPARIN SODIUM (PORCINE) 1000 UNIT/ML IJ SOLN: 1000 [IU]/mL | INTRAMUSCULAR | Qty: 10 | Fill #0

## 2023-10-29 MED FILL — ADMELOG 100 UNIT/ML IJ SOLN: 100 [IU]/mL | INTRAMUSCULAR | Qty: 1 | Fill #0

## 2023-10-29 MED FILL — ACETAMINOPHEN 10 MG/ML IV SOLN: 10 mg/mL | INTRAVENOUS | Qty: 100 | Fill #0

## 2023-10-29 MED FILL — HEPARIN SOD (PORCINE) IN D5W 100 UNIT/ML IV SOLN: 100 [IU]/mL | INTRAVENOUS | Qty: 250 | Fill #0

## 2023-10-29 MED FILL — METOPROLOL TARTRATE 5 MG/5ML IV SOLN: 5 mg/mL | INTRAVENOUS | Qty: 5 | Fill #0

## 2023-10-29 NOTE — ED Notes (Deleted)
 2028 Pt noted with Agonal breathing, rescue breathing with ambu-bag initiated.  O2 Sat at 88%     Delman Han, RN  10/29/23 2221

## 2023-10-29 NOTE — ED Notes (Signed)
 Report received from [place]. Rounded, pt boards updated.      Gretel Jayson DEL, RN  10/29/23 0730

## 2023-10-29 NOTE — ED Notes (Signed)
 Patient is alert.  Aware of plan of care.   Resting at this time.  No complaints.  Call light within reach.  Awaiting orders and disposition.       Gretel Jayson DEL, RN  10/29/23 1023

## 2023-10-29 NOTE — Consults (Signed)
 [facility]  [facility]  Consult Note    Patient: [name] [name] Age: 38 y.o. Sex: female    Date of Birth: Mar 04, 1985 Admit Date: 10/28/2023 PCP: Rosi Josette DEL, MD   MRN: [id number]  CSN: [id number]        Requesting physician: Dr. Sean  Outpatient cardiologist: none    [name] [name] is a 38 y.o. female who is being seen on consult for syncope, elevated troponin, hypertensive emergency.    Impression:     Syncope  - Patient presented to ER 10/28/2023 with syncope. She states she was going down the stairs when she blacked out. Fell down stairs. Denies head trauma. Endorses BP in 190's systolic all week.   - CT head 10/29/2023:  No acute intracranial abnormalities.  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 HR 100 QTcB 461   Hypertensive heart disease   - NTproBNP 1771  AKI   - Scr 1.46   Hx of cyclical vomiting  DM type 1  Gastroparesis   Marijuana use       Cardiac testing:   Echo 09/28/2020: EF 61%. Mod thickened septal wall and severely thickened posterior wall. No VHD.   Echo 10/29/2023: EF 69%. Mod cLVH. No WMA.     Recommendations:     Syncope  - Continue BP control.   - Consider holter monitor at DC    Elevated troponin  - likely demand in setting of ongoing uncontrolled BP   -  Consider NST once more clinically stable if indicated   - no chest pain or dyspnea.     Hypertensive Emergency  - Continue Cardene gtt for BP control; transition to PO medications as tolerable.   - If gastroparesis and cyclical vomiting continues to be ongoing issue consider clonidine patch for BP control prior to DC     Final recommendations per cardiology attending    Thank you for the consult; We will follow    HPI:  Pt with PMHx as noted above presented to  ED with c/o syncope. Patient presented to ER 10/28/2023 with syncope. She states she was going down the stairs when she blacked out and fell down stairs. She denies any head trauma but stated her R side of her back had been hurting. She endorsed her BP in 190's systolic all week. In ER BP 220/102, hs troponin 122 > 101 > 114, Scr 1.46, EKG with NSR with LVH and inferolateral ST/TW abnormalities and anterior mild ST elevation and CXR unremarkable. At present patient is in bed resting. She states only concern at this time is she is tired and can't get comfortable. She states still smoking marijuana and cyclic vomiting has been overall controlled. She endorses compliance with her home BP medications that include: Lisinopril, HCTZ, and Norvasc.  She denies any chest pain, dyspnea, orthopnea, and peripheral edema.    Chief Complaint   Patient presents with    Fall    Loss of Consciousness       CXR:  XR CHEST (2 VW) 10/28/2023    Narrative  INTERPRETATION:  XR CHEST (2 VW).    INDICATION: rib pain.    The cardiac silhouette is normal in size. The mediastinal contour is  unremarkable. There  is no parenchymal consolidation, pleural effusion or  pneumothorax. Visualized osseous structures are unremarkable.    Impression  IMPRESSION:  No evidence for an acute cardiopulmonary process.    Electronically signed by: Alm Smiles, MD 10/28/2023 6:57 PM EDT  Workstation ID: [id number]         EKG:  Personal Review NSR with LVH and inferolateral ST/TW abnormalities and anterior mild ST elevation. HR 100 QTcB 461         Admission diagnosis: Type 2 myocardial infarction due to hypertension [facility])  Patient Active Problem List   Diagnosis    Insulin  long-term use (HCC)    Slow transit constipation    Marijuana use    Intractable nausea and vomiting    Leukocytosis    DKA, type 1 (HCC)    Cyclical vomiting    Essential hypertension    Diabetic gastroparesis (HCC)    Intractable vomiting    Moderate protein-calorie malnutrition     Diabetic ketoacidosis without coma associated with type 1 diabetes mellitus (HCC)    Type 1 diabetes mellitus (HCC)    Gastritis    Type 2 myocardial infarction due to hypertension [facility])       Past Medical History:   Diagnosis Date    Cyclical vomiting 07/09/2017    Diabetic ketoacidosis without coma associated with type 1 diabetes mellitus (HCC) 11/05/2015    Drug-seeking behavior     Essential hypertension 03/27/2016    Amlodipine, carvedilol, lisinopril.    Gastroparesis     Insulin  long-term use (HCC) 11/20/2016    Slow transit constipation 08/08/2017    Type 1 diabetes mellitus (HCC) 02/17/2009    Diagnosed at age 89. Had insulin  pump at one time, but developed keloids at needle insertion sites. Was supposed to establish with [facility].     Past Surgical History:   Procedure Laterality Date    CESAREAN SECTION      PR UNLISTED PROCEDURE ABDOMEN PERITONEUM & OMENTUM  05/2015    Gastric stimulator pump remover in 2021    SALPINGECTOMY Bilateral 11/28/2022    LAPAROSCOPIC BILATERAL SALPINGECTOMY performed by Trudy Prentice LABOR, MD at [facility] MAIN OR     Social History     Socioeconomic History    Marital status: Single     Spouse name: Not on file    Number of children: Not on file    Years of education: Not on file    Highest education level: Not on file   Occupational History    Not on file   Tobacco Use    Smoking status: Never    Smokeless tobacco: Never   Vaping Use    Vaping status: Never Used   Substance and Sexual Activity    Alcohol use: No    Drug use: Not Currently     Types: Marijuana Oda)    Sexual activity: Not on file   Other Topics Concern    Not on file   Social History Narrative    Not on file     Social Drivers of Health     Financial Resource Strain: Not on file   Food Insecurity: No Food Insecurity (01/09/2023)    Received [facility]    Hunger Vital Sign     Within the past 12 months, you worried that your food would run out before you got the money to buy more.: Never true     Within  the past 12 months, the food you bought just didn't last  and you didn't have money to get more.: Never true   Transportation Needs: No Transportation Needs (01/09/2023)    Received from [facility] - [facility]     Lack of Transportation (Medical): No     Lack of Transportation (Non-Medical): No   Physical Activity: Not on file   Stress: Not on file   Social Connections: Not on file   Intimate Partner Violence: Not At Risk (01/09/2023)    Received from [facility]    Humiliation, Afraid, Rape, and Kick questionnaire     Within the last year, have you been afraid of your partner or ex-partner?: No     Within the last year, have you been humiliated or emotionally abused in other ways by your partner or ex-partner?: No     Within the last year, have you been kicked, hit, slapped, or otherwise physically hurt by your partner or ex-partner?: No     Within the last year, have you been raped or forced to have any kind of sexual activity by your partner or ex-partner?: No   Housing Stability: Low Risk  (01/09/2023)    Received from [facility] Stability Vital Sign     In the last 12 months, was there a time when you were not able to pay the mortgage or rent on time?: No     In the past 12 months, how many times have you moved where you were living?: 0     At any time in the past 12 months, were you homeless or living in a shelter (including now)?: No     Family History   Problem Relation Age of Onset    Stroke Other         great grandmother    Diabetes Father        Allergies   Allergen Reactions    Methocarbamol Other (See Comments)     Body shaking, nausea    Azithromycin Hives    Penicillins Hives       Home Medications:     Prior to Admission medications   Medication Sig Start Date End Date Taking? Authorizing Provider   oxyCODONE -acetaminophen  (PERCOCET) 5-325 MG per tablet Take 1 tablet by mouth every 4 hours as needed for Pain for up to 3 days. Max Daily Amount: 6 tablets 11/28/22  12/01/22  Trudy Prentice LABOR, MD   lisinopril-hydroCHLOROthiazide (PRINZIDE;ZESTORETIC) 10-12.5 MG per tablet Take 1 tablet by mouth daily 10/20/22   [provider]   amLODIPine (NORVASC) 5 MG tablet     [provider]   Glucagon (GVOKE HYPOPEN 2-PACK) 1 MG/0.2ML SOAJ AS DIRECTED IN THE EVENT OF UNCONCIOUS HYPOGLYCEMIA DX E10.65    [provider]   insulin  glulisine (APIDRA) 100 UNIT/ML injection pen Inject into the skin    [provider]   medroxyPROGESTERone (DEPO-PROVERA) 150 MG/ML injection Inject 1 mL into the muscle    [provider]   Insulin  Aspart, w/Niacinamide, (FIASP PUMPCART SC) Inject into the skin    [provider]   insulin  glargine (LANTUS SOLOSTAR) 100 UNIT/ML injection pen Inject 28 Units into the skin every evening    Automatic Reconciliation, Ar       Inpt meds:   sodium chloride  flush  5-40 mL IntraVENous 2 times per day    insulin  glargine  1-100 Units SubCUTAneous QHS    insulin  lispro  1-100 Units SubCUTAneous 4 times per day  Review of Systems:     Positives in bold    Constitutional: fever, chills, weight loss, weakness, fatigue   Eyes: blurred vision, double vision  ENT: sore throat, congestion, ear pain  Respiratory: cough, shortness of breath, orthopnea, PND, wheezing  Cardiovascular: chest pain/pressure, palpitations, syncope  Gastrointestinal: abdominal pain, nausea, vomiting, diarrhea, melena, hematochezia, hematemesis  Genitourinary: painful urination, frequency, urgency  Musculoskeletal: joint pain, swelling  Integumentary: rashes, wounds  Endocrine: increased polydipsia, polyuria, polyphagia, cold/heat intolerance  Neurological: headache, dizziness, syncope, extremity weakness, paresthesias  Psychological: depression, anxiety, substance abuse    Physical Assessment:     BP (!) 139/109   Pulse (!) 121   Temp 98.2 F (36.8 C) (Oral)   Resp 17   Ht 1.651 m (5' 5)   Wt 72.6 kg (160 lb)   SpO2 100%   BMI 26.63 kg/m      GEN: NAD, conversant, well nourished, well developed  HEENT:  conjunctiva not injected, sclera anicteric, PERRL  Neck: No JVD, supple, trachea midline  Resp: Clear to auscultation bilaterally; No wheezes or rales  CV: tachycardia, regular rhythm normal s1and s2; No murmurs or rubs  Abd: Soft, Nontender; ND  Ext: No clubbing, cyanosis or edema  Neuro: Alert and oriented, moves all four extremities  Skin: Warm, Dry, well perfused  Psyc: mood is good, appropriate affect    Labs:    GFR: Estimated Creatinine Clearance: 52 mL/min (A) (based on SCr of 1.46 mg/dL (H)).     Lab Results   Component Value Date/Time    WBC 11.9 (H) 10/28/2023 09:37 PM    RBC 4.25 10/28/2023 09:37 PM    HGB 12.1 10/28/2023 09:37 PM    HCT 35.6 10/28/2023 09:37 PM    MCV 83.8 10/28/2023 09:37 PM    MCH 28.5 10/28/2023 09:37 PM    MCHC 34.0 10/28/2023 09:37 PM    RDW 40.7 10/28/2023 09:37 PM    PLT 288 10/28/2023 09:37 PM    MPV 13.4 10/28/2023 09:37 PM       Lab Results   Component Value Date/Time    MONOS 6.6 10/28/2023 09:37 PM    BASOS 0.6 10/28/2023 09:37 PM       Lab Results   Component Value Date    NA 135 (L) 10/28/2023    K 3.4 (L) 10/28/2023    CL 102 10/28/2023    CO2 24 10/28/2023    BUN 12 10/28/2023    MG 1.6 10/29/2023    PHOS 2.1 (L) 08/16/2020      No results found for: CPK, CKMB, BNP     Lab Results   Component Value Date/Time    TSH 1.405 08/15/2020 06:00 PM        Lab Results   Component Value Date    APTT 183.0 (HH) 10/29/2023       No results found for: HBA1C, HBA1CPOC    No results found for: CHOL, CHOLPOCT, CHLST, CHOLV, HDL, HDLPOC, HDLC, LDL, LDLC, VLDLC, VLDL     Lab Results   Component Value Date    ALT <7 (L) 10/28/2023          I have personally and independently reviewed all imaging, diagnostic medical testing, and laboratory data to date.  I have integrated these findings into my Assessment and Plan outlined above.    ELIDA JONELLE AID, PA-C  [facility]  [facility]  October 29, 2023 9:25 AM

## 2023-10-29 NOTE — ED Notes (Signed)
 Called lab at this time to get an update on APTT result.     [name] [name], [place]  10/29/23 [phone]

## 2023-10-29 NOTE — ED Notes (Signed)
 Patient provided with ice packs for head per request.        Garwin Corean SAILOR, RN  10/29/23 [phone]

## 2023-10-29 NOTE — ED Notes (Signed)
 TRANSFER - OUT REPORT:    Verbal report given to SDU RN on [name] [name] being transferred to [id number] for routine progression of patient care       Report consisted of patient's Situation, Background, Assessment and   Recommendations(SBAR).     Information from the following report(s) Nurse Handoff Report, Index, ED SBAR, Adult Overview, and MAR was reviewed with the receiving nurse.  Kinder Assessment: Presents to emergency department  because of falls (Syncope, seizure, or loss of consciousness): No, Age > 70: No, Altered Mental Status, Intoxication with alcohol or substance confusion (Disorientation, impaired judgment, poor safety awaremess, or inability to follow instructions): No, Impaired Mobility: Ambulates or transfers with assistive devices or assistance; Unable to ambulate or transer.: No, Nursing Judgement: No  Lines:   Peripheral IV 10/28/23 Left;Anterior Forearm (Active)       Peripheral IV 10/28/23 Right Forearm (Active)   Site Assessment Clean, dry & intact 10/28/23 2310   Line Status Flushed;Specimen collected;Blood return noted 10/28/23 2310   Line Care Cap changed 10/28/23 2310   Phlebitis Assessment No symptoms 10/28/23 2310   Infiltration Assessment 0 10/28/23 2310   Alcohol Cap Used No 10/28/23 2310   Dressing Status New dressing applied 10/28/23 2310   Dressing Type Transparent 10/28/23 2310   Dressing Intervention New 10/28/23 2310        Medications sent with patient from pharmacy and placed in tamper-evident security bag: None to send  Patient belongings: Belongings sent to floor    Opportunity for questions and clarification was provided.      Patient transported with:  Registered Nurse          Delman Han, RN  10/29/23 2258

## 2023-10-29 NOTE — ED Notes (Signed)
 Approached pt to start new IV, pt declined and requested IV team to place 2nd IV     Maresa Morash, RN  10/29/23 [phone]

## 2023-10-29 NOTE — ED Notes (Signed)
 Patient is alert.  Aware of plan of care.   Resting at this time.  Complains of legs itching due to morphine .  Call light within reach.  Awaiting orders and disposition.       Gretel Jayson DEL, RN  10/29/23 [phone]

## 2023-10-29 NOTE — ED Notes (Signed)
 Report given to Vernell OBIE Garwin Corean LOISE, RN  10/29/23 [phone]

## 2023-10-29 NOTE — ED Notes (Signed)
 Received care report from [facility].  Pt in bed, easily awakened by voice,.  Denies c/o pain at this time.    Bed locked in lowest position, side rails up and call bell within reach     Delman Han, RN  10/29/23 2018

## 2023-10-29 NOTE — ED Notes (Signed)
 Patient is alert.  Aware of plan of care.   Resting at this time.  No complaints.  Call light within reach.  Awaiting orders disposition.       Gretel Jayson DEL, RN  10/29/23 [phone]

## 2023-10-29 NOTE — Plan of Care (Signed)
 Problem: Safety - Adult  Goal: Free from fall injury  Outcome: Progressing

## 2023-10-29 NOTE — H&P (Signed)
 Medicine History and Physical    Patient: [name] [name] Age: 38 y.o. Sex: female    Date of Birth: 23-Apr-1985 Admit Date: 10/28/2023 PCP: Rosi Josette DEL, MD   MRN: [id number]  CSN: [id number]         Assessment   Syncope   NSTEMI  HTN emergency   Acute Renal insufficiency, Cr. 1.46  Hypokalemia, 3.4  Acute right sided low back pain   DM 1 with hyperglycemia 203  Hx of gastroparesis       Plan   Heparin drip   Nicardipine drip   Trending troponin, tele monitoring   OS Vital signs   Trending Cr, avoiding nephrotoxic meds, UA   Cardiology consulted  Diet NPO pending cardiology evaluation   DVT PPX on heparin drip   ACP: CODE STATUS FULL CODE     CT head shows:   1. No acute intracranial abnormalities.  2. 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  CTA chest shows:   1. No pulmonary emboli.  2. No acute abnormalities.      Chief Complaint:  Chief Complaint   Patient presents with    Fall    Loss of Consciousness         HPI:   [name] [name] is a 38 y.o. year old female with PMH as noted above who presents after a syncopal episode.    Noted to have severely elevated BP. Patient takes Hctz, lisinopril amlodipine and SBP over 200 mmHg. Nicardipine gtt started.    Patient was going down stairs and blacked out falling down stairs injuring right lower back. Noted to have elevated troponin. ECG ant leads TWI and cardiology consulted reccommending Heparin drip.        Review of Systems - 12 Point ROS -ve except what is noted in the HPI.     Past Medical History:  Past Medical History:   Diagnosis Date    Cyclical vomiting 07/09/2017    Diabetic ketoacidosis without coma associated with type 1 diabetes mellitus (HCC) 11/05/2015    Drug-seeking behavior     Essential hypertension 03/27/2016    Amlodipine, carvedilol, lisinopril.    Gastroparesis     Insulin  long-term use (HCC) 11/20/2016    Slow transit constipation 08/08/2017    Type 1 diabetes mellitus (HCC) 02/17/2009    Diagnosed at  age 24. Had insulin  pump at one time, but developed keloids at needle insertion sites. Was supposed to establish with [facility].       Past Surgical History:  Past Surgical History:   Procedure Laterality Date    CESAREAN SECTION      PR UNLISTED PROCEDURE ABDOMEN PERITONEUM & OMENTUM  05/2015    Gastric stimulator pump remover in 2021    SALPINGECTOMY Bilateral 11/28/2022    LAPAROSCOPIC BILATERAL SALPINGECTOMY performed by Trudy Prentice LABOR, MD at [facility] MAIN OR       Family History:  Family History   Problem Relation Age of Onset    Stroke Other         great grandmother    Diabetes Father        Social History:  Social History     Socioeconomic History    Marital status: Single   Tobacco Use    Smoking status: Never    Smokeless tobacco: Never   Vaping Use    Vaping status: Never Used   Substance and Sexual Activity    Alcohol use: No  Drug use: Not Currently     Types: Marijuana Oda)     Social Drivers of Health     Food Insecurity: No Food Insecurity (01/09/2023)    Received from [facility]    Hunger Vital Sign     Within the past 12 months, you worried that your food would run out before you got the money to buy more.: Never true     Within the past 12 months, the food you bought just didn't last and you didn't have money to get more.: Never true   Transportation Needs: No Transportation Needs (01/09/2023)    Received from [facility] - Transportation     Lack of Transportation (Medical): No     Lack of Transportation (Non-Medical): No   Intimate Partner Violence: Not At Risk (01/09/2023)    Received from [facility]    Humiliation, Afraid, Rape, and Kick questionnaire     Within the last year, have you been afraid of your partner or ex-partner?: No     Within the last year, have you been humiliated or emotionally abused in other ways by your partner or ex-partner?: No     Within the last year, have you been kicked, hit, slapped, or otherwise physically hurt by your partner or  ex-partner?: No     Within the last year, have you been raped or forced to have any kind of sexual activity by your partner or ex-partner?: No   Housing Stability: Low Risk  (01/09/2023)    Received from [facility] Stability Vital Sign     In the last 12 months, was there a time when you were not able to pay the mortgage or rent on time?: No     In the past 12 months, how many times have you moved where you were living?: 0     At any time in the past 12 months, were you homeless or living in a shelter (including now)?: No       Home Medications:  Prior to Admission medications   Medication Sig Start Date End Date Taking? Authorizing Provider   oxyCODONE -acetaminophen  (PERCOCET) 5-325 MG per tablet Take 1 tablet by mouth every 4 hours as needed for Pain for up to 3 days. Max Daily Amount: 6 tablets 11/28/22 12/01/22  Trudy Prentice LABOR, MD   lisinopril-hydroCHLOROthiazide (PRINZIDE;ZESTORETIC) 10-12.5 MG per tablet Take 1 tablet by mouth daily 10/20/22   [provider]   amLODIPine (NORVASC) 5 MG tablet     [provider]   Glucagon (GVOKE HYPOPEN 2-PACK) 1 MG/0.2ML SOAJ AS DIRECTED IN THE EVENT OF UNCONCIOUS HYPOGLYCEMIA DX E10.65    [provider]   insulin  glulisine (APIDRA) 100 UNIT/ML injection pen Inject into the skin    [provider]   medroxyPROGESTERone (DEPO-PROVERA) 150 MG/ML injection Inject 1 mL into the muscle    [provider]   Insulin  Aspart, w/Niacinamide, (FIASP PUMPCART SC) Inject into the skin    [provider]   insulin  glargine (LANTUS SOLOSTAR) 100 UNIT/ML injection pen Inject 28 Units into the skin every evening    Automatic Reconciliation, Ar       Allergies:  Allergies   Allergen Reactions    Methocarbamol Other (See Comments)     Body shaking, nausea    Azithromycin Hives    Penicillins Hives         Physical Exam:   Visit Vitals  BP (!) 220/102  Pulse 83   Temp 98.8 F (37.1 C) (Temporal)   Resp 18   Ht 1.651 m (5'  5)   Wt 72.6 kg (160 lb)   SpO2 98%   BMI 26.63 kg/m       Physical Exam:  General appearance: alert, cooperative, no distress, appears stated age  Head: Normocephalic, without obvious abnormality, atraumatic  Neck: supple, trachea midline  Lungs: clear to auscultation bilaterally  Heart: regular rate and rhythm, S1, S2 normal, no murmur, click, rub or gallop  Abdomen: soft, non-tender. Bowel sounds normal. No masses,  no organomegaly  Extremities: extremities normal, atraumatic, no cyanosis or edema  Skin: Skin color, texture, turgor normal. No rashes or lesions  Neurologic: Grossly normal      Intake and Output:  Current Shift:  No intake/output data recorded.  Last three shifts:  No intake/output data recorded.    Lab/Data Reviewed:  Lab:   Recent Results (from the past 12 hours)   CBC with Auto Differential    Collection Time: 10/28/23  9:37 PM   Result Value Ref Range    WBC 11.9 (H) 4.0 - 11.0 1000/mm3    RBC 4.25 3.60 - 5.20 M/uL    Hemoglobin 12.1 11.0 - 16.0 gm/dl    Hematocrit 64.3 64.9 - 47.0 %    MCV 83.8 80.0 - 98.0 fL    MCH 28.5 25.4 - 34.6 pg    MCHC 34.0 30.0 - 36.0 gm/dl    Platelets 711 859 - 450 1000/mm3    MPV 13.4 6.0 - 10.0 fL    RDW 40.7 36.4 - 46.3      Nucleated RBCs 0 0 - 0      Immature Granulocytes % 0.2 0.0 - 3.0 %    Neutrophils Segmented 60.8 34 - 64 %    Lymphocytes 31.6 28 - 48 %    Monocytes 6.6 1 - 13 %    Eosinophils 0.2 0 - 5 %    Basophils 0.6 0 - 3 %   CMP    Collection Time: 10/28/23  9:37 PM   Result Value Ref Range    Potassium 3.4 (L) 3.5 - 5.1 mEq/L    Chloride 102 98 - 107 mEq/L    Sodium 135 (L) 136 - 145 mEq/L    CO2 24 20 - 31 mEq/L    Glucose 203 (H) 74 - 106 mg/dl    BUN 12 9 - 23 mg/dl    Creatinine 8.53 (H) 0.55 - 1.02 mg/dl    GFR African American 52.0      GFR Non-African American 43      Calcium 9.2 8.7 - 10.4 mg/dl    Anion Gap 9 5 - 15 mmol/L    AST 15.0 0.0 - 33.9 U/L    ALT <7 (L) 10 - 49 U/L    Alkaline Phosphatase 75 46 - 116 U/L    Total Bilirubin 0.90  0.30 - 1.20 mg/dl    Total Protein 7.3 5.7 - 8.2 gm/dl    Albumin  4.1 3.4 - 5.0 gm/dl   Troponin    Collection Time: 10/28/23  9:37 PM   Result Value Ref Range    Troponin, High Sensitivity 122 (HH) 0 - 34 ng/L   proBNP, N-TERMINAL    Collection Time: 10/28/23  9:37 PM   Result Value Ref Range    NT Pro-BNP 1,771.0 (H) 0.0 - 125.0 pg/ml   T4, Free    Collection Time: 10/28/23  9:37  PM   Result Value Ref Range    T4 Free 1.59 0.89 - 1.76 ng/dl   TSH    Collection Time: 10/28/23  9:37 PM   Result Value Ref Range    TSH, High Sensitivity 1.46 0.55 - 4.78 uIU/mL   APTT    Collection Time: 10/28/23 10:53 PM   Result Value Ref Range    aPTT 29.8 25.1 - 36.5 seconds       Critical care time 42 minutes.   Mathias Goodrich, MD  October 29, 2023

## 2023-10-29 NOTE — ED Notes (Signed)
 Patient is alert.  Aware of plan of care.   Resting at this time.  No complaints.  Call light within reach.  Titrated Cardene per MAR.  Awaiting orders and disposition.       Gretel Jayson DEL, RN  10/29/23 1140

## 2023-10-29 NOTE — ED Notes (Deleted)
 Called to Pt's room    Pt unresponsive to voice, shaking or deep sternal rub. VS unstable with Bradycardia at 35.  BP 97/41, RR <10.    Pt was clinching teeth, breathing slow but labored.     Assistance requested.  Nursing manager informed, advised to call RRT due to pt's in-patient status.     RRT called, 2nd and 3rd nurse, tech, RT in for assist.        Delman Han, RN  10/29/23 2220

## 2023-10-29 NOTE — Progress Notes (Signed)
 2D echo completed - report to follow

## 2023-10-30 LAB — CBC
Hematocrit: 34.6 % — ABNORMAL LOW (ref 35.0–47.0)
Hemoglobin: 11.3 g/dL (ref 11.0–16.0)
MCH: 28.5 pg (ref 25.4–34.6)
MCHC: 32.7 g/dL (ref 30.0–36.0)
MCV: 87.2 fL (ref 80.0–98.0)
MPV: 13.4 fL — ABNORMAL HIGH (ref 6.0–10.0)
Platelets: 285 1000/mm3 (ref 140–450)
RBC: 3.97 M/uL (ref 3.60–5.20)
RDW: 44 (ref 36.4–46.3)
WBC: 20.4 1000/mm3 — ABNORMAL HIGH (ref 4.0–11.0)

## 2023-10-30 LAB — APTT
aPTT: 161.9 s (ref 25.1–36.5)
aPTT: 65 s — ABNORMAL HIGH (ref 25.1–36.5)
aPTT: 56 s — ABNORMAL HIGH (ref 25.1–36.5)
aPTT: 23 s — ABNORMAL LOW (ref 25.1–36.5)

## 2023-10-30 LAB — RENAL FUNCTION PANEL
Albumin: 3.7 g/dL (ref 3.4–5.0)
Anion Gap: 9 mmol/L (ref 5–15)
BUN: 11 mg/dL (ref 9–23)
CO2: 19 meq/L — ABNORMAL LOW (ref 20–31)
Calcium: 8.9 mg/dL (ref 8.7–10.4)
Chloride: 102 meq/L (ref 98–107)
Creatinine: 1.62 mg/dL — ABNORMAL HIGH (ref 0.55–1.02)
GFR African American: 46
GFR Non-African American: 38
Glucose: 376 mg/dL — ABNORMAL HIGH (ref 74–106)
Phosphorus: 3.4 mg/dL (ref 2.4–5.1)
Potassium: 5 meq/L (ref 3.5–5.1)
Sodium: 130 meq/L — ABNORMAL LOW (ref 136–145)

## 2023-10-30 LAB — POCT GLUCOSE
POC Glucose: 298 mg/dL — ABNORMAL HIGH (ref 65–105)
POC Glucose: 455 mg/dL (ref 65–105)
POC Glucose: 381 mg/dL — ABNORMAL HIGH (ref 65–105)
POC Glucose: 446 mg/dL (ref 65–105)
POC Glucose: 369 mg/dL — ABNORMAL HIGH (ref 65–105)
POC Glucose: 439 mg/dL (ref 65–105)

## 2023-10-30 LAB — MAGNESIUM: Magnesium: 1.9 mg/dL (ref 1.6–2.6)

## 2023-10-30 MED ORDER — DEXTROSE 50 % IV SOLN
50 | INTRAVENOUS | Status: DC | PRN
Start: 2023-10-30 — End: 2023-11-01

## 2023-10-30 MED ORDER — HYDRALAZINE HCL 20 MG/ML IJ SOLN
20 | Freq: Four times a day (QID) | INTRAMUSCULAR | Status: DC | PRN
Start: 2023-10-30 — End: 2023-11-01

## 2023-10-30 MED ORDER — INSULIN GLARGINE 100 UNIT/ML SC SOLN
100 | Freq: Every evening | SUBCUTANEOUS | Status: DC
Start: 2023-10-30 — End: 2023-10-30

## 2023-10-30 MED ORDER — INSULIN GLARGINE 100 UNIT/ML SC SOLN
100 | Freq: Every evening | SUBCUTANEOUS | Status: DC
Start: 2023-10-30 — End: 2023-11-01
  Administered 2023-11-01: 01:00:00 11 [IU] via SUBCUTANEOUS

## 2023-10-30 MED ORDER — GLUCAGON (RDNA) 1 MG IJ KIT
1 | INTRAMUSCULAR | Status: DC | PRN
Start: 2023-10-30 — End: 2023-11-01

## 2023-10-30 MED ORDER — INSULIN LISPRO 100 UNIT/ML IJ SOLN
100 | Freq: Once | INTRAMUSCULAR | Status: AC
Start: 2023-10-30 — End: 2023-10-30
  Administered 2023-10-30: 22:00:00 15 [IU] via SUBCUTANEOUS

## 2023-10-30 MED ORDER — INSULIN LISPRO 100 UNIT/ML IJ SOLN
100 | Freq: Four times a day (QID) | INTRAMUSCULAR | Status: DC
Start: 2023-10-30 — End: 2023-11-01
  Administered 2023-10-30: 16:00:00 5 [IU] via SUBCUTANEOUS
  Administered 2023-10-30: 23:00:00 7 [IU] via SUBCUTANEOUS
  Administered 2023-10-31: 22:00:00 3 [IU] via SUBCUTANEOUS
  Administered 2023-10-31: 16:00:00 5 [IU] via SUBCUTANEOUS
  Administered 2023-10-31: 13:00:00 3 [IU] via SUBCUTANEOUS
  Administered 2023-11-01 (×2): 2 [IU] via SUBCUTANEOUS

## 2023-10-30 MED ORDER — CARVEDILOL 6.25 MG PO TABS
6.25 | Freq: Two times a day (BID) | ORAL | Status: DC
Start: 2023-10-30 — End: 2023-11-01
  Administered 2023-10-30 – 2023-11-01 (×4): 12.5 mg via ORAL

## 2023-10-30 MED ORDER — INSULIN LISPRO 100 UNIT/ML IJ SOLN
100 | INTRAMUSCULAR | Status: DC | PRN
Start: 2023-10-30 — End: 2023-11-01

## 2023-10-30 MED FILL — HYDROCHLOROTHIAZIDE 25 MG PO TABS: 25 mg | ORAL | Qty: 1 | Fill #0

## 2023-10-30 MED FILL — MORPHINE SULFATE 2 MG/ML IJ SOLN: 2 mg/mL | INTRAMUSCULAR | Qty: 1 | Fill #0

## 2023-10-30 MED FILL — SODIUM CHLORIDE 0.9 % IV SOLN: 0.9 % | INTRAVENOUS | Qty: 1000 | Fill #0

## 2023-10-30 MED FILL — POLYETHYLENE GLYCOL 3350 17 G PO PACK: 17 g | ORAL | Qty: 1 | Fill #0

## 2023-10-30 MED FILL — AMLODIPINE BESYLATE 5 MG PO TABS: 5 mg | ORAL | Qty: 2 | Fill #0

## 2023-10-30 MED FILL — BD POSIFLUSH 0.9 % IV SOLN: 0.9 % | INTRAVENOUS | Qty: 40 | Fill #0

## 2023-10-30 MED FILL — DIPHENHYDRAMINE HCL 50 MG/ML IJ SOLN: 50 mg/mL | INTRAMUSCULAR | Qty: 1 | Fill #0

## 2023-10-30 MED FILL — LISINOPRIL 20 MG PO TABS: 20 mg | ORAL | Qty: 1 | Fill #0

## 2023-10-30 MED FILL — CARVEDILOL 6.25 MG PO TABS: 6.25 mg | ORAL | Qty: 2 | Fill #0

## 2023-10-30 MED FILL — LANTUS 100 UNIT/ML SC SOLN: 100 [IU]/mL | SUBCUTANEOUS | Qty: 1 | Fill #0

## 2023-10-30 MED FILL — HEPARIN SOD (PORCINE) IN D5W 100 UNIT/ML IV SOLN: 100 [IU]/mL | INTRAVENOUS | Qty: 250 | Fill #0

## 2023-10-30 MED FILL — KLOR-CON M20 20 MEQ PO TBCR: 20 meq | ORAL | Qty: 2 | Fill #0

## 2023-10-30 NOTE — Progress Notes (Signed)
"  Pt [name] [name], MRN [id number], RM 5323 PTT is 161.9, Heparin  has been stopped at this time. How would you like to proceed with management? Sharmaine,RN [phone]  "

## 2023-10-30 NOTE — Progress Notes (Signed)
"   [name] [name], MRN [id number], RM [phone], FYI pt is refusing IV access by charge nurse. Consult already placed for picc team. Awaiting transfer after IV access is obtained. Pt is difficult stick.1123   "

## 2023-10-30 NOTE — Progress Notes (Signed)
 "  Medicine Progress Note    Patient: [name] [name]   Age:  38 y.o.  DOA: 10/28/2023     LOS:  LOS: 1 day     Assessment/Plan   Syncope   NSTEMI  HTN emergency   Acute Renal insufficiency, Cr. 1.46  Hypokalemia, 3.4  DM 1 with hyperglycemia 203  Hx of gastroparesis     - Cardiology appreciated, echo reviewed LVEF 60%, no adverse findings, DC heparin  drip, ACS ruled  - BP controlled off nicardipine  drip, but with rising creatinine we will hold lisinopril  and HCTZ and replace with Coreg , continue amlodipine   - Increase IV fluid rate from 75-100, encourage p.o. intake of fluids.  Recheck creatinine in a.m.  - Increase Glucomander multiplier from 0.3->0.7  - Transfer out of SDU to telemetry floor  - Mobilize  -Anticipate if creatinine stabilized,  BP controlled and no new events should be appropriate for discharge soon    Subjective:   Patient seen and examined.  Aches and pains from fall but better than yesterday, eating and drinking without issue, No N/V, F/C, CP or SOB      Objective:   Visit Vitals  BP (!) 133/97   Pulse (!) 109   Temp 98.2 F (36.8 C) (Temporal)   Resp 14   Ht 1.651 m (5' 5)   Wt 72.6 kg (160 lb)   SpO2 100%   BMI 26.63 kg/m       Physical Exam:  General appearance: alert, cooperative, no distress, appears stated age      Medications Reviewed:  Current Facility-Administered Medications   Medication Dose Route Frequency    dextrose  50 % IV solution  10-50 mL IntraVENous PRN    glucagon  injection 1 mg  1 mg IntraMUSCular PRN    insulin  lispro (HUMALOG ,ADMELOG ) injection vial 1-100 Units  1-100 Units SubCUTAneous 4x Daily AC & HS    insulin  lispro (HUMALOG ,ADMELOG ) injection vial 1-100 Units  1-100 Units SubCUTAneous PRN    insulin  glargine (LANTUS ) injection vial 1-100 Units  1-100 Units SubCUTAneous QHS    carvedilol  (COREG ) tablet 12.5 mg  12.5 mg Oral BID WC    hydrALAZINE  (APRESOLINE ) injection 10 mg  10 mg IntraVENous Q6H PRN    niCARdipine  (CARDENE ) 20 mg in 0.9 % sodium chloride  200 mL  solution  0.5-15 mg/hr IntraVENous Continuous    sodium chloride  flush 0.9 % injection 5-40 mL  5-40 mL IntraVENous 2 times per day    sodium chloride  flush 0.9 % injection 5-40 mL  5-40 mL IntraVENous PRN    0.9 % sodium chloride  infusion   IntraVENous PRN    potassium chloride  (KLOR-CON  M) extended release tablet 40 mEq  40 mEq Oral PRN    Or    potassium chloride  20 MEQ/15ML (10%) oral solution 40 mEq  40 mEq Oral PRN    Or    potassium chloride  10 mEq/100 mL IVPB (Peripheral Line)  10 mEq IntraVENous PRN    magnesium  sulfate 2000 mg in 50 mL IVPB premix  2,000 mg IntraVENous PRN    ondansetron  (ZOFRAN -ODT) disintegrating tablet 4 mg  4 mg Oral Q8H PRN    Or    ondansetron  (ZOFRAN ) injection 4 mg  4 mg IntraVENous Q6H PRN    polyethylene glycol (GLYCOLAX ) packet 17 g  17 g Oral Daily PRN    acetaminophen  (TYLENOL ) tablet 650 mg  650 mg Oral Q6H PRN    Or    acetaminophen  (TYLENOL ) suppository 650 mg  650 mg Rectal Q6H PRN    dextrose  50 % IV solution  10-50 mL IntraVENous PRN    glucagon  injection 1 mg  1 mg IntraMUSCular PRN    insulin  lispro (HUMALOG ,ADMELOG ) injection vial 1-100 Units  1-100 Units SubCUTAneous PRN    oxyCODONE -acetaminophen  (PERCOCET) 5-325 MG per tablet 1 tablet  1 tablet Oral Q4H PRN    morphine  (PF) injection 2 mg  2 mg IntraVENous Q3H PRN    diphenhydrAMINE  (BENADRYL ) injection 25 mg  25 mg IntraVENous Q6H PRN    amLODIPine  (NORVASC ) tablet 10 mg  10 mg Oral Daily    [Held by provider] lisinopril  (PRINIVIL ;ZESTRIL ) tablet 20 mg  20 mg Oral Daily    [Held by provider] hydroCHLOROthiazide  (HYDRODIURIL ) tablet 12.5 mg  12.5 mg Oral Daily    0.9 % sodium chloride  infusion   IntraVENous Continuous         Intake and Output:  Current Shift:  No intake/output data recorded.  Last three shifts:  10/01 1901 - 10/03 0700  In: 1100   Out: -     Lab/Data Reviewed:  All lab and imaging  results for the last 24 hours reviewed.    GAITHER SHAUNNA ALY, MD  P# [phone]  October 30, 2023    [facility] medical  dictation software was used for portions of this report.  Unintended voice transcription errors may have occurred.      "

## 2023-10-30 NOTE — Progress Notes (Signed)
"  Chaplain Services  Unable to Visit Patient    Start Time: 1340  End Time: 1344    Volunteer Chaplain attempted to conduct a consultation and Spiritual Assessment for Shakea S Gruenberg, who is a 38 y.o.,female.  According to the patient's EMR Religious Affiliation is: Other.     Patient is asleep, and is not available to be assessed at this time.  No Family present.  Offered prayer remotely on patient's behalf.     Assessment:  Patient has no known religious/cultural needs that will affect patient's preferences in health care.  Patient has no known spiritual or religious issues which require intervention at this time.     Plan:  Chaplains will continue to follow and will provide pastoral care on an as needed/requested basis.  Volunteer Chaplain recommends bedside caregivers page Duty Chaplain if patient shows signs of acute spiritual or emotional distress.   Chaplain charted for [employer].    Sonny Hoit   Chaplain  Spiritual Care  [phone]  "

## 2023-10-30 NOTE — Progress Notes (Signed)
"  Lenoir Essick, MRN [id number], Rm 5323,Can we pause the transfer for now , pt bp flucuates 160 with Blood sugars uncontrolled ranging between 140-400, pt has no IV access and a difficult stick. 1123  "

## 2023-10-30 NOTE — Progress Notes (Signed)
"  [name] [name], MRN [id number], RM [phone], Returned call , Per protocol restarted heparin  andn decreased by 3u and currently running at [facility]. [phone]  "

## 2023-10-30 NOTE — Discharge Instructions (Addendum)
 [name] [name]

## 2023-10-30 NOTE — Progress Notes (Signed)
"  Pt [name] [name] , MRN [id number].Room 5323 . Given Morphine  for pain 8/10 . bp is 181/96 and HR 105. 8893  "

## 2023-10-30 NOTE — Progress Notes (Signed)
 "[facility] ([facility]   CARDIOLOGY PROGRESS NOTE  RECS:  TYPE 2 MI due to HTN             Troponin 122->101->114              Not ACS              D/C UFH              BP goal < 130/80 preferably 120/60               Amlodipine  10              HCTZ 12.5              Lisinopril  20              Off cardene  infusion              ECHO without RWMA       BLACKED OUT              No dysrhythmia       DM               Insulin  therapy to A1C <7.0                 455 and 381 this morning                      ASSESSMENT:  [name] [name] is a 38 y.o. female  with blacked out  Hospital Problems           Last Modified POA    * (Principal) Type 2 myocardial infarction due to hypertension (HCC) 10/29/2023 Yes    Overview Addendum 10/29/2023 12:21 PM by Irish Million, MD   10/28/23 ER ED complaining of syncope and fall. Patient states that she was going down the steps today and just blacked out causing her to trip and fall on her right side down 4 steps and I slid down. She denies any her head but states that she did hurt the right side of her back. Also is unsure why she blacked out. States that her blood pressure has been high all week in the 190s systolic. 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  EKG NSR LVH with strain vs IL ischemia  10/29/23 ECHO EF 69%, LV 20/32, S/PW 15/15, LVMI 93 gms/m2, LAVI 16, No MR/TR, No RWMA         Elevated troponin 10/29/2023 Yes    Overview Addendum 10/29/2023 10:40 AM by Junie Barnacle R, PA-C   09/28/2020 ECHO EF 61%. Mod thickened septal wall and severely thickened posterior wall. No VHD.   10/29/2023 ECHO EF 69%. Mod cLVH. No WMA.          Syncope 10/29/2023 Yes    Overview Addendum 10/29/2023  10:45 AM by Junie Barnacle SAUNDERS, PA-C   10/29/2023 Patient presented to ER 10/28/2023 with syncope. She states she was going down the stairs when she blacked out. Fell down stairs. Denies head trauma. Endorses BP in 190's systolic all week. BP 220/102. Hs troponin 122 > 101 > 114,  EKG  10/28/2023: NSR with LVH and inferolateral ST/TW abnormalities and anterior mild ST elevation. HR 100 QTcB 461. Scr 1.46, CXR unremarkable. CT head 10/29/2023:  No acute intracranial abnormalities. 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 Was supposed   to follow-up with implanting gastroenterologist and [facility].           Type 1 diabetes mellitus (HCC) 10/29/2023 Yes    Overview Signed 04/15/2020  5:08 AM by Edi, Convprob1   Diagnosed at age 23. Had insulin  pump at one time, but developed keloids   at needle insertion sites. Was supposed to establish with [facility].              SUBJECTIVE:  No CP or SOB    VS:   Vitals:    10/30/23 0000 10/30/23 0400 10/30/23 0436 10/30/23 0506   BP:       Pulse: 95 95     Resp: 20 25 25 20    Temp: 98.6 F (37 C) 98.6 F (37 C)     TempSrc:  Oral     SpO2: 99% 99%     Weight:       Height:           Body mass index is 26.63 kg/m.   No intake or output data in the 24 hours ending 10/30/23 0743  I/O last 3 completed shifts:  In: 1100 [IV Piggyback:1100]  Out: -   No intake/output data recorded.      TELE personally reviewed by me:  sinus    ROS: Positives BOLDED  GEN: fever, chills sweats  CVS: Chest pain, palpitations, DOE, PND, orthopnea, claudication  RESP: epistaxis, SOB, wheezing, productive cough  GI: melena, hematochezia, hematemesis, N/V, diarrhea, constipation  GU: hematuria, dysuria    EXAM:  General:  Skin  warm, perfusion adequate  Neck: JVD is absent, carotids without bruits  Lungs: clear, no rales, no rhonchi   Cardiac:  Regular Rhythm, 1/6 SE murmur, +S4 Gallops No Rubs  Abdomen: soft, nl bowel sounds   Ext: Edema is absent  Pulses: + 2 symmetrical  Neuro: Alert and oriented; Nonfocal defects    Labs:  Basic Metabolic Profile   Lab Results   Component Value Date    NA 130 (L) 10/30/2023    K 5.0 10/30/2023    CL 102 10/30/2023    CO2 19 (L) 10/30/2023    BUN 11 10/30/2023    CREATININE 1.62 (H) 10/30/2023    GLUCOSE 376 (H) 10/30/2023    CALCIUM  8.9 10/30/2023    BILITOT 0.90 10/28/2023    ALKPHOS 75 10/28/2023    AST 15.0 10/28/2023    ALT <7 (L) 10/28/2023    LABGLOM 38 10/30/2023    GFRAA 46.0 10/30/2023         Lab Results   Component Value Date/Time    NA 130 10/30/2023 04:20 AM    K 5.0 10/30/2023 04:20 AM    CL 102 10/30/2023 04:20 AM    CO2 19 10/30/2023 04:20 AM    CO2 21.0 02/24/2018 09:48 PM    BUN 11 10/30/2023 04:20 AM    CREATININE 1.62 10/30/2023 04:20 AM  GLUCOSE 376 10/30/2023 04:20 AM    CALCIUM  8.9 10/30/2023 04:20 AM    LABGLOM 38 10/30/2023 04:20 AM      Lab Results   Component Value Date    CREATININE 1.62 (H) 10/30/2023    CREATININE 1.46 (H) 10/28/2023    CREATININE 1.1 11/28/2022     Lab Results   Component Value Date/Time    MG 1.9 10/30/2023 04:20 AM        CBC w/Diff    Recent Labs     10/28/23  2137 10/30/23  0420   WBC 11.9* 20.4*   RBC 4.25 3.97   HCT 35.6 34.6*   MCV 83.8 87.2   MCH 28.5 28.5   MCHC 34.0 32.7   RDW 40.7 44.0   MPV 13.4 13.4*    Recent Labs     10/28/23  2137 10/30/23  0420   MONOS 6.6  --    BASOS 0.6  --    RDW 40.7 44.0      @LASTHABA1C @  No results found for: CHOL  No results found for: TRIG  No results found for: HDL  No results found for: LDL  No results found for: VLDL  No results found for: [facility]      Cardiac Enzymes   Lab Results   Component Value Date/Time    TROPHS 114 10/29/2023 05:23 AM    TROPHS 101 10/29/2023 01:41 AM    TROPHS 122  10/28/2023 09:37 PM     Lab Results   Component Value Date    TROPHS 114 (HH) 10/29/2023     @30606 @  No results found for: BNP         Coagulation   INR/Prothrombin Time   No results found for: IRON, TIBC, FERRITIN  Lab Results   Component Value Date    TSH 1.405 08/15/2020    T4FREE 1.59 10/28/2023       No results found for: LVEF, LVEFMODE          Medications:  @CMEDSCH @    ALVERNA QUILL, MD                        October 30, 2023                            7:43 AM    @Albeiro Trompeter  PHEBE QUILL, MD, [facility]  [facility], [facility]  Phone:  [phone](office)  Cell: [phone]  Pager: [phone]@          "

## 2023-10-30 NOTE — Progress Notes (Signed)
"  Tried returning page however unable to reach primary nurse [phone] nor floor [phone].   "

## 2023-10-30 NOTE — Care Coordination (Signed)
"  [facility] Manager Initial Assessment        Home/Support/Situation prior to admission: lives at home with 2 daughters, independent, able to drive    Current family/patient discharge plan or goals of care: receive treatment, feel better, return home        List DME prior to admission: none        Home address, phone number, insurance, emergency contacts verified:  yes        PCP:  Rosi Josette DEL, MD     PCP Confirmed Active: yes                         Agreeable to [facility] Pharmacy for DC medications: yes    [facility] [address]                Dialysis patient?  no     Active Medicaid?  no               Is patient planning to go straight LTC at DC?  no      Agreeable to [facility] and Supportive Care if [facility] is recommended at DC (list alternate agency if agreeable to outside agency):  no        Agreeable to post-acute care facility if recommended at DC? yes    Signed:   HADASSAH JONELLE BRACKETT , Case Manager  October 30, 2023  3:46 PM  Department Phone: [phone]         10/30/23 1544   Service Assessment   Information Provided By Patient   Patient Orientation Alert;Oriented   Cognition No Apparent Deficit   Primary Caregiver Self   Support Systems Children   Patient's Healthcare Decision Maker is: Legal Next of Kin   PCP Verified by CM Yes   Last Visit to PCP Within last 3 months   Prior Functional Level Independent in ADLs/IADLs   History of falls? 0   Current Functional Level at Time of Initial Assessment Independent in ADLs/IADLs   Can patient return to prior living arrangement Yes   Ability to make needs known: Good   Social/Functional History   Prior Level of Assist for ADLs Independent   Prior Level of Assist for Homemaking Independent   Homemaking Responsibilities Yes   Prior Level of Assist for Transfers Independent   Ambulation Assistance Independent   Active Driver Yes   Occupation Retired   [employer], [employer] Prior to Acute Admission  Apartment   Lives With Daughter   Current Services Prior To Admission None   Potential Assistance Needed N/A   Potential Assistance Obtaining Medications No   Patient expects to be discharged to: Home   Home Layout Two level   Home Access Level entry   Services At/After Discharge   [employer] Information Provided? No       "

## 2023-10-31 LAB — POCT GLUCOSE
POC Glucose: 87 mg/dL (ref 65–105)
POC Glucose: 169 mg/dL — ABNORMAL HIGH (ref 65–105)
POC Glucose: 282 mg/dL — ABNORMAL HIGH (ref 65–105)
POC Glucose: 298 mg/dL — ABNORMAL HIGH (ref 65–105)
POC Glucose: 144 mg/dL — ABNORMAL HIGH (ref 65–105)

## 2023-10-31 MED FILL — NICARDIPINE HCL IN NACL 20-0.9 MG/200ML-% IV SOLN: 20-0.9 MG/200ML-% | INTRAVENOUS | Qty: 200 | Fill #0

## 2023-10-31 MED FILL — DIPHENHYDRAMINE HCL 50 MG/ML IJ SOLN: 50 mg/mL | INTRAMUSCULAR | Qty: 1 | Fill #0

## 2023-10-31 MED FILL — MORPHINE SULFATE 2 MG/ML IJ SOLN: 2 mg/mL | INTRAMUSCULAR | Qty: 1 | Fill #0

## 2023-10-31 MED FILL — SODIUM CHLORIDE FLUSH 0.9 % IV SOLN: 0.9 % | INTRAVENOUS | Qty: 10 | Fill #0

## 2023-10-31 MED FILL — AMLODIPINE BESYLATE 5 MG PO TABS: 5 mg | ORAL | Qty: 2 | Fill #0

## 2023-10-31 MED FILL — CARVEDILOL 6.25 MG PO TABS: 6.25 mg | ORAL | Qty: 2 | Fill #0

## 2023-10-31 NOTE — Progress Notes (Addendum)
 "  [facility] MEDICINE   Progress Note  10/31/23     Patient: [name] [name]  Age: 38 y.o.     Sex: female    Date of Birth:DOB@  Admit Date: 10/28/2023 PCP: Rosi Josette DEL, MD    MRN: [id number]  CSN: [id number]       Significant Interval Events/Findings:  BG and BP still a bit erratic.      Subjective Patient Report:  No new complaints    ASSESSMENT  Type 2 MI secondary to hypertension  Stable. Troponin trend and clinical picture consistent with type 2 MI, not ACS. Heparin  discontinued per cardiology. No new ischemic symptoms.  Hypertensive emergency  Improving. BP improved on oral regimen but still up to SBP 170s; nicardipine  drip discontinued. Hydralazine  available PRN. HCTZ held due to renal status.  Acute renal insufficiency  HCTZ held. No new creatinine reported overnight.  Hypokalemia  Stable. Potassium replacement protocol ongoing.  Type 1 diabetes mellitus with hyperglycemia  Persistent hyperglycemia. Continued on Glucomander.  History of gastroparesis  Chronic. No current symptoms.    PLAN:  Continue amlodipine  10 mg and carvedilol  12.5 mg for blood pressure control   Hydralazine  10 mg PRN for breakthrough hypertension  Hold HCTZ due to renal insufficiency   Discontinued  nicardipine  drip per cardiology   Continue Glucomander for glycemic management  See if we can get insulin  for her pump.    Continue potassium replacement protocol   Pain management with PRN Percocet and morphine   Repeat BMP  Diet: ADULT DIET; Regular; 3 carb choices (45 gm/meal)   Mobility: Misc nursing order (specify)   DVT prophylaxis:  This patient does not have an active medication from one of the medication groupers. Patient young, mobile, low dvt risk.  Cont activity.  Code status:Full Code   Disposition: likely home tomorrow if we can get BP stable.         OBJECTIVE:  VS: BP (!) 158/74   Pulse 88   Temp 98.2 F (36.8 C) (Oral)   Resp 18   Ht 1.651 m (5' 5)   Wt 72.6 kg (160 lb)   SpO2 100%   BMI  26.63 kg/m   I&O:   Intake/Output Summary (Last 24 hours) at 10/31/2023 0828  Last data filed at 10/31/2023 0816  Gross per 24 hour   Intake 697 ml   Output 0 ml   Net 697 ml       Exam:  Physical Exam  Pleasant AAF in NAD  Patient resting in bed on phone conversation  Acyanotic  No signs of respiratory distress  No pallor  Talking without conversational dyspnea    Labs:  Recent Results (from the past 24 hours)   POCT Glucose    Collection Time: 10/30/23 12:02 PM   Result Value Ref Range    POC Glucose 446 (HH) 65 - 105 mg/dL   APTT    Collection Time: 10/30/23  1:10 PM   Result Value Ref Range    aPTT 23.0 (L) 25.1 - 36.5 seconds   POCT Glucose    Collection Time: 10/30/23  3:35 PM   Result Value Ref Range    POC Glucose 369 (H) 65 - 105 mg/dL   POCT Glucose    Collection Time: 10/30/23  5:27 PM   Result Value Ref Range    POC Glucose 439 (HH) 65 - 105 mg/dL   POCT Glucose    Collection Time: 10/30/23  9:10 PM  Result Value Ref Range    POC Glucose 87 65 - 105 mg/dL   POCT Glucose    Collection Time: 10/31/23  2:40 AM   Result Value Ref Range    POC Glucose 169 (H) 65 - 105 mg/dL   POCT Glucose    Collection Time: 10/31/23  6:08 AM   Result Value Ref Range    POC Glucose 282 (H) 65 - 105 mg/dL        Imaging:  CT Head W/O Contrast   Final Result   IMPRESSION:   1. No acute intracranial abnormalities.   2. 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      Electronically signed by: Chevis Kid, MD 10/29/2023 12:03 AM EDT             Workstation ID: [id number]         CTA Chest Pulmonary Embolism W Contrast   Final Result   IMPRESSION:.   1. No pulmonary emboli.   2. No acute abnormalities.      Electronically signed by: Chevis Kid, MD 10/29/2023 12:09 AM EDT             Workstation ID: [id number]         XR CHEST (2 VW)   Final Result   IMPRESSION:  No evidence for an acute cardiopulmonary process.      Electronically signed by: Alm Smiles, MD 10/28/2023 6:57  PM EDT             Workstation ID: [id number]         XR LUMBAR SPINE (2-3 VIEWS)   Final Result   IMPRESSION: No acute osseous injury      Electronically signed by: Alm Smiles, MD 10/28/2023 6:55 PM EDT             Workstation ID: [id number]              Medications:    Current Facility-Administered Medications:     dextrose  50 % IV solution, 10-50 mL, IntraVENous, PRN, MacGregor, Gaither SQUIBB, MD    glucagon  injection 1 mg, 1 mg, IntraMUSCular, PRN, MacGregor, Gaither SQUIBB, MD    insulin  lispro (HUMALOG ,ADMELOG ) injection vial 1-100 Units, 1-100 Units, SubCUTAneous, 4x Daily AC & HS, Sean Gaither SQUIBB, MD, 7 Units at 10/30/23 1848    insulin  lispro (HUMALOG ,ADMELOG ) injection vial 1-100 Units, 1-100 Units, SubCUTAneous, PRN, MacGregor, Roy P, MD    insulin  glargine (LANTUS ) injection vial 1-100 Units, 1-100 Units, SubCUTAneous, QHS, MacGregor, Gaither SQUIBB, MD    carvedilol  (COREG ) tablet 12.5 mg, 12.5 mg, Oral, BID WC, MacGregor, Roy P, MD, 12.5 mg at 10/30/23 1601    hydrALAZINE  (APRESOLINE ) injection 10 mg, 10 mg, IntraVENous, Q6H PRN, MacGregor, Roy P, MD    niCARdipine  (CARDENE ) 20 mg in 0.9 % sodium chloride  200 mL solution, 0.5-15 mg/hr, IntraVENous, Continuous, Pizarro, Daniella R, PA-C, Stopped at 10/29/23 1310    sodium chloride  flush 0.9 % injection 5-40 mL, 5-40 mL, IntraVENous, 2 times per day, Jasarevic, Muhamed, MD, 10 mL at 10/30/23 2100    sodium chloride  flush 0.9 % injection 5-40 mL, 5-40 mL, IntraVENous, PRN, Jasarevic, Muhamed, MD    0.9 % sodium chloride  infusion, , IntraVENous, PRN, Jasarevic, Muhamed, MD    potassium chloride  (KLOR-CON  M) extended release tablet 40 mEq, 40 mEq, Oral, PRN, 40 mEq at 10/29/23 2054 **OR** potassium chloride  20 MEQ/15ML (10%) oral solution 40 mEq, 40 mEq, Oral, PRN **OR** potassium chloride  10  mEq/100 mL IVPB (Peripheral Line), 10 mEq, IntraVENous, PRN, Jasarevic, Muhamed, MD    magnesium  sulfate 2000 mg in 50 mL IVPB premix, 2,000 mg, IntraVENous, PRN, Jasarevic, Muhamed,  MD    ondansetron  (ZOFRAN -ODT) disintegrating tablet 4 mg, 4 mg, Oral, Q8H PRN **OR** ondansetron  (ZOFRAN ) injection 4 mg, 4 mg, IntraVENous, Q6H PRN, Jasarevic, Muhamed, MD, 4 mg at 10/29/23 0755    polyethylene glycol (GLYCOLAX ) packet 17 g, 17 g, Oral, Daily PRN, Jasarevic, Muhamed, MD, 17 g at 10/30/23 1848    acetaminophen  (TYLENOL ) tablet 650 mg, 650 mg, Oral, Q6H PRN **OR** acetaminophen  (TYLENOL ) suppository 650 mg, 650 mg, Rectal, Q6H PRN, Jasarevic, Muhamed, MD    dextrose  50 % IV solution, 10-50 mL, IntraVENous, PRN, Jasarevic, Muhamed, MD    glucagon  injection 1 mg, 1 mg, IntraMUSCular, PRN, Jasarevic, Muhamed, MD    insulin  lispro (HUMALOG ,ADMELOG ) injection vial 1-100 Units, 1-100 Units, SubCUTAneous, PRN, Jasarevic, Muhamed, MD    oxyCODONE -acetaminophen  (PERCOCET) 5-325 MG per tablet 1 tablet, 1 tablet, Oral, Q4H PRN, MacGregor, Roy P, MD    morphine  (PF) injection 2 mg, 2 mg, IntraVENous, Q3H PRN, MacGregor, Roy P, MD, 2 mg at 10/31/23 0005    diphenhydrAMINE  (BENADRYL ) injection 25 mg, 25 mg, IntraVENous, Q6H PRN, MacGregor, Roy P, MD, 25 mg at 10/31/23 0002    amLODIPine  (NORVASC ) tablet 10 mg, 10 mg, Oral, Daily, Irish Million, MD, 10 mg at 10/30/23 1025    [Held by provider] lisinopril  (PRINIVIL ;ZESTRIL ) tablet 20 mg, 20 mg, Oral, Daily, Irish Million, MD, 20 mg at 10/30/23 1025    [Held by provider] hydroCHLOROthiazide  (HYDRODIURIL ) tablet 12.5 mg, 12.5 mg, Oral, Daily, Irish Million, MD, 12.5 mg at 10/30/23 1025    0.9 % sodium chloride  infusion, , IntraVENous, Continuous, Sean Gaither SQUIBB, MD, Last Rate: 100 mL/hr at 10/31/23 0225, New Bag at 10/31/23 0225       Sonny Simpers, MD  [facility]  Phone [phone]  Pager [phone]  Fax [phone]    Please use secure chat for non-urgent matters from 7am-7pm  When possible please assign scheduled nurse to secure chat for MD/RN communications.  For URGENT MATTERS: Please page [phone]        Billing:  Level of  Care/Complexity or Time spent in Chart Review, Patient Interview, Exam, Counseling, Chart Completion,Care Coordination:   Follow-up 99232 - 35 min, Mod       "

## 2023-10-31 NOTE — Progress Notes (Signed)
 Pt refusing bed alarm to be activated. Educated pt on fall prevention and safety with staff when ambulating. Pt continues to refuse despite teaching. Charge nurse made aware of refusal.

## 2023-10-31 NOTE — Progress Notes (Signed)
 "[facility] ([facility]   CARDIOLOGY PROGRESS NOTE  RECS:  TYPE 2 MI due to HTN             Troponin 122->101->114              Not ACS              BP goal < 130/80 preferably 120/60               Amlodipine  10              HCTZ 12.5 stopped              Carvedilol  12.5 BID              Lisinopril  20 held              ECHO without RWMA       BLACKED OUT              No dysrhythmia       DM              Saline infusion running                         Insulin  therapy to A1C <7.0                  282-298 this morning                  Was [phone] yesterday        ASSESSMENT:  [name] [name] is a 38 y.o. 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 Patient states that she was going down the steps today and just blacked out causing her to trip and fall on her right side down 4 steps and I slid down. She denies any her head but states that she did hurt the right side of her back. Also is unsure why she blacked out. States that her blood pressure has been high all week in the 190s systolic. 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  EKG NSR LVH with strain vs IL ischemia  10/29/23 ECHO EF 69%, LV 20/32, S/PW 15/15, LVMI 93 gms/m2, LAVI 16, No MR/TR, No RWMA         Elevated troponin 10/29/2023 Yes    Overview Addendum 10/29/2023 10:40 AM by Junie Barnacle R, PA-C   09/28/2020 ECHO EF 61%. Mod thickened septal wall and severely thickened posterior wall. No VHD.   10/29/2023 ECHO EF 69%. Mod cLVH. No WMA.           Syncope 10/29/2023 Yes    Overview Addendum 10/29/2023 10:45 AM by Junie Barnacle SAUNDERS, PA-C   10/29/2023 Patient presented to ER 10/28/2023 with syncope. She states she was going down the stairs when she blacked out. Fell down stairs. Denies head  trauma. Endorses BP in 190's systolic all week. BP 220/102. Hs troponin 122 > 101 > 114,  EKG 10/28/2023: NSR with LVH and inferolateral ST/TW abnormalities and anterior mild ST elevation. HR 100 QTcB 461. Scr 1.46, CXR unremarkable. CT head 10/29/2023:  No acute intracranial abnormalities. 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 Was supposed   to follow-up with implanting gastroenterologist and [facility].           Type 1 diabetes mellitus (HCC) 10/29/2023 Yes    Overview Signed 04/15/2020  5:08 AM by Edi, Convprob1   Diagnosed at age 64. Had insulin  pump at one time, but developed keloids   at needle insertion sites. Was supposed to establish with [facility].              SUBJECTIVE:  No CP or SOB    VS:   Vitals:    10/30/23 2037 10/31/23 0106 10/31/23 0217 10/31/23 0816   BP:   (!) 140/77 (!) 158/74   Pulse:   87 88   Resp:   18 18   Temp: 97.2 F (36.2 C) 98.7 F (37.1 C) 98.3 F (36.8 C) 98.2 F (36.8 C)   TempSrc:    Oral   SpO2:    100%   Weight:       Height:         Patient Vitals for the past 24 hrs:   BP Temp Temp src Pulse Resp SpO2   10/31/23 1230 (!) 154/88 98.2 F (36.8 C) Oral 88 18 100 %   10/31/23 0816 (!) 158/74 98.2 F (36.8 C) Oral 88 18 100 %   10/31/23 0217 (!) 140/77 98.3 F (36.8 C) -- 87 18 --   10/31/23 0106 -- 98.7 F (37.1 C) -- -- -- --   10/30/23 2037 -- 97.2 F (36.2 C) -- -- -- --   10/30/23 1800 (!) 155/78 -- -- 90 16 100 %   10/30/23 1630 --  98 F (36.7 C) Temporal -- -- --   10/30/23 1600 128/70 -- -- 89 17 100 %   10/30/23 1322 (!) 133/97 -- -- (!) 109 14 100 %   10/30/23 1300 (!) 176/100 -- -- (!) 106 (!) 33 99 %       Body mass index is 26.63 kg/m.     Intake/Output Summary (Last 24 hours) at 10/31/2023 1228  Last data filed at 10/31/2023 0816  Gross per 24 hour   Intake 697 ml   Output 0 ml   Net 697 ml     I/O last 3 completed shifts:  In: 577 [P.O.:577]  Out: 0   I/O this shift:  In: 120 [P.O.:120]  Out: 0       TELE personally reviewed by me:  na    ROS: Positives BOLDED  GEN: fever, chills sweats  CVS: Chest pain, palpitations, DOE, PND, orthopnea, claudication  RESP: epistaxis, SOB, wheezing, productive cough  GI: melena, hematochezia, hematemesis, N/V, diarrhea, constipation  GU: hematuria, dysuria    EXAM:  General:  Skin warm, perfusion adequate  Neck: JVD is absent, carotids without bruits  Lungs: clear, no rales, no rhonchi  Cardiac:  Regular Rhythm,1/6 SE murmur, No Gallops No Rubs  Abdomen: soft, nl bowel sounds   Ext: Edema is absent  Pulses: + 2 symmetrical  Neuro: Alert and oriented; Nonfocal defects    Labs:  Basic Metabolic Profile   Lab Results   Component Value Date    NA 130 (L) 10/30/2023    K 5.0 10/30/2023    CL 102 10/30/2023    CO2 19 (L) 10/30/2023    BUN 11 10/30/2023    CREATININE 1.62 (H) 10/30/2023    GLUCOSE 376 (H) 10/30/2023    CALCIUM  8.9 10/30/2023    BILITOT 0.90 10/28/2023    ALKPHOS 75 10/28/2023    AST 15.0 10/28/2023    ALT <7 (L) 10/28/2023    LABGLOM 38 10/30/2023    GFRAA 46.0 10/30/2023         Lab Results   Component Value Date/Time    NA 130 10/30/2023 04:20 AM    K 5.0 10/30/2023 04:20 AM    CL 102 10/30/2023 04:20 AM    CO2 19 10/30/2023 04:20 AM    CO2 21.0 02/24/2018 09:48 PM    BUN 11 10/30/2023 04:20 AM    CREATININE 1.62 10/30/2023 04:20 AM    GLUCOSE 376 10/30/2023 04:20 AM    CALCIUM  8.9 10/30/2023 04:20 AM    LABGLOM 38 10/30/2023 04:20 AM      Lab Results   Component Value Date    CREATININE  1.62 (H) 10/30/2023    CREATININE 1.46 (H) 10/28/2023    CREATININE 1.1 11/28/2022     Lab Results   Component Value Date/Time    MG 1.9 10/30/2023 04:20 AM        CBC w/Diff    Recent Labs     10/28/23  2137 10/30/23  0420   WBC 11.9* 20.4*   RBC 4.25 3.97   HCT 35.6 34.6*   MCV 83.8 87.2   MCH 28.5 28.5   MCHC 34.0 32.7   RDW 40.7 44.0   MPV 13.4 13.4*    Recent Labs     10/28/23  2137 10/30/23  0420   MONOS 6.6  --    BASOS 0.6  --    RDW 40.7 44.0      @LASTHABA1C @  No results found for: CHOL  No results found for: TRIG  No results found for: HDL  No results found for: LDL  No results found for: VLDL  No results found for: [facility]      Cardiac Enzymes   Lab Results   Component Value Date/Time    TROPHS 114 10/29/2023 05:23 AM    TROPHS 101 10/29/2023 01:41 AM    TROPHS 122 10/28/2023 09:37 PM     Lab Results   Component Value Date    TROPHS 114 (HH) 10/29/2023     @30606 @  No results found for: BNP         Coagulation   INR/Prothrombin Time   No results found for: IRON, TIBC, FERRITIN  Lab Results   Component Value Date    TSH 1.405 08/15/2020    T4FREE 1.59 10/28/2023       No results found for: LVEF, LVEFMODE          Medications:  @CMEDSCH @    [name] QUILL, MD                        October 31, 2023  12:28 PM    @Marlos Carmen  [name] QUILL, MD, [facility]  [facility], [facility]  Phone:  [phone](office)  Cell: [phone]  Pager: [phone]@          "

## 2023-10-31 NOTE — Plan of Care (Signed)
"    Problem: Safety - Adult  Goal: Free from fall injury  10/31/2023 0512 by Roylene Berwyn SQUIBB, LPN  Outcome: Progressing  Flowsheets (Taken 10/31/2023 0512)  Free From Fall Injury: Instruct family/caregiver on patient safety  10/31/2023 0221 by Elaine Natter, RN  Outcome: Progressing  10/30/2023 1733 by Jenny Margit LABOR, RN  Outcome: Progressing     Problem: Chronic Conditions and Co-morbidities  Goal: Patient's chronic conditions and co-morbidity symptoms are monitored and maintained or improved  10/31/2023 0512 by Roylene Berwyn SQUIBB, LPN  Outcome: Progressing  Flowsheets (Taken 10/31/2023 0512)  Care Plan - Patient's Chronic Conditions and Co-Morbidity Symptoms are Monitored and Maintained or Improved:   Monitor and assess patient's chronic conditions and comorbid symptoms for stability, deterioration, or improvement   Collaborate with multidisciplinary team to address chronic and comorbid conditions and prevent exacerbation or deterioration   Update acute care plan with appropriate goals if chronic or comorbid symptoms are exacerbated and prevent overall improvement and discharge  10/31/2023 0221 by Elaine Natter, RN  Outcome: Progressing  10/30/2023 1733 by Jenny Margit LABOR, RN  Outcome: Progressing     Problem: Pain  Goal: Verbalizes/displays adequate comfort level or baseline comfort level  10/31/2023 0512 by Roylene Berwyn SQUIBB, LPN  Outcome: Progressing  Flowsheets (Taken 10/31/2023 0512)  Verbalizes/displays adequate comfort level or baseline comfort level:   Consider cultural and social influences on pain and pain management   Administer analgesics based on type and severity of pain and evaluate response   Encourage patient to monitor pain and request assistance   Assess pain using appropriate pain scale  10/31/2023 0221 by Elaine Natter, RN  Outcome: Progressing  10/30/2023 1733 by Jenny Margit LABOR, RN  Outcome: Progressing     "

## 2023-11-01 LAB — POCT GLUCOSE
POC Glucose: 271 mg/dL — ABNORMAL HIGH (ref 65–105)
POC Glucose: 226 mg/dL — ABNORMAL HIGH (ref 65–105)
POC Glucose: 51 mg/dL — ABNORMAL LOW (ref 65–105)
POC Glucose: 96 mg/dL (ref 65–105)

## 2023-11-01 LAB — BASIC METABOLIC PANEL
Anion Gap: 4 mmol/L — ABNORMAL LOW (ref 5–15)
BUN: 8 mg/dL — ABNORMAL LOW (ref 9–23)
CO2: 24 meq/L (ref 20–31)
Calcium: 8.3 mg/dL — ABNORMAL LOW (ref 8.7–10.4)
Chloride: 105 meq/L (ref 98–107)
Creatinine: 1.4 mg/dL — ABNORMAL HIGH (ref 0.55–1.02)
GFR African American: 54
GFR Non-African American: 45
Glucose: 215 mg/dL — ABNORMAL HIGH (ref 74–106)
Potassium: 3.9 meq/L (ref 3.5–5.1)
Sodium: 133 meq/L — ABNORMAL LOW (ref 136–145)

## 2023-11-01 MED ORDER — ATORVASTATIN CALCIUM 10 MG PO TABS
10 | Freq: Every evening | ORAL | Status: DC
Start: 2023-11-01 — End: 2023-11-01

## 2023-11-01 MED ORDER — ATORVASTATIN CALCIUM 10 MG PO TABS
10 | ORAL_TABLET | Freq: Every evening | ORAL | 3 refills | Status: AC
Start: 2023-11-01 — End: ?

## 2023-11-01 MED ORDER — CARVEDILOL 12.5 MG PO TABS
12.5 | ORAL_TABLET | Freq: Two times a day (BID) | ORAL | 3 refills | Status: AC
Start: 2023-11-01 — End: ?

## 2023-11-01 MED ORDER — TRAMADOL HCL 50 MG PO TABS
50 | ORAL_TABLET | Freq: Four times a day (QID) | ORAL | 0 refills | Status: AC | PRN
Start: 2023-11-01 — End: 2023-11-04

## 2023-11-01 MED ORDER — LISINOPRIL 5 MG PO TABS
5 | Freq: Every day | ORAL | Status: DC
Start: 2023-11-01 — End: 2023-11-01

## 2023-11-01 MED ORDER — AMLODIPINE BESYLATE 10 MG PO TABS
10 | ORAL_TABLET | Freq: Every day | ORAL | 3 refills | Status: AC
Start: 2023-11-01 — End: ?

## 2023-11-01 MED FILL — MORPHINE SULFATE 2 MG/ML IJ SOLN: 2 mg/mL | INTRAMUSCULAR | Qty: 1

## 2023-11-01 MED FILL — DIPHENHYDRAMINE HCL 50 MG/ML IJ SOLN: 50 mg/mL | INTRAMUSCULAR | Qty: 1

## 2023-11-01 MED FILL — CARVEDILOL 6.25 MG PO TABS: 6.25 mg | ORAL | Qty: 2

## 2023-11-01 MED FILL — SODIUM CHLORIDE FLUSH 0.9 % IV SOLN: 0.9 % | INTRAVENOUS | Qty: 10

## 2023-11-01 MED FILL — AMLODIPINE BESYLATE 5 MG PO TABS: 5 mg | ORAL | Qty: 2

## 2023-11-01 NOTE — Telephone Encounter (Addendum)
"  ----------  DocumentID: [id number] (AP)-------------------------------------------              [facility]                       Patient Education Report         Name: ENOLA, SIEBERS                  Date: 10/31/2023    MRN: [id number]                    Time: 8:02:42 AM         Patient ordered video: 'Patient Safety: Stay Safe While you are in the Hospital'    from [id number] via phone number: [phone] at 8:02:42 AM    Description: This program outlines some of the precautions patients can take to ensure a speedy recovery without extra complications. The video emphasizes the importance of communicating with the healthcare team.    ----------DocumentID: [id number] (AP)-------------------------------------------                       [facility]          Patient Education Report - Discharge Summary        Date: 11/01/2023   Time: 6:05:19 PM   Name: GETHSEMANE, FISCHLER   MRN: [id number]      Account Number: [id number]      Education History:        Patient ordered video: 'Patient Safety: Stay Safe While you are in the Hospital' from [id number] on 10/31/2023 08:02:42 AM  "

## 2023-11-01 NOTE — Discharge Summary (Signed)
 "  [facility] MEDICINE   Progress Note  Discharge Date: 11/01/2023      Patient: [name] [name]  Age: 38 y.o.     Sex: female    Date of Birth: 1985-05-09  Admit Date: 10/28/2023 PCP: Rosi Josette DEL, MD    MRN: [id number]  CSN: [id number]       HPI  38 year old female with a history of type 1 diabetes mellitus (diagnosed age 48), diabetic gastroparesis (with gastric pacemaker), hypertension, and chronic marijuana use presented on 10/28/2023 after a syncopal episode and fall down four steps. She reported a week of poorly controlled blood pressure (systolic in 190s), decreased oral intake due to abdominal pain, and persistent hyperglycemia. She denied chest pain, shortness of breath, or head trauma at presentation. Initial evaluation revealed hypertensive emergency, acute renal insufficiency, and elevated troponin (peaking at 122 ng/L, trending to 101 and 114 ng/L), consistent with type 2 myocardial infarction (MI) secondary to hypertension. She also had persistent hyperglycemia and hypokalemia on admission.    Hospital Course  During her hospitalization, the patient was managed for a type 2 myocardial infarction secondary to hypertensive emergency. Troponin levels peaked at 122 ng/L and trended down, with no evidence of acute coronary syndrome or new ischemic symptoms. Blood pressure was initially markedly elevated and required intravenous nicardipine , which was later discontinued as oral antihypertensives (amlodipine  and carvedilol ) achieved partial control. Hydralazine  was available PRN for breakthrough hypertension. Both HCTZ and lisinopril  were held due to worsening renal function. Acute renal insufficiency was monitored, with creatinine rising from a baseline of 1.1 mg/dL to a peak of 8.37 mg/dL, then improving to 8.59 mg/dL at discharge. The patients type 1 diabetes was complicated by persistent hyperglycemia, managed with Glucomander protocol and subcutaneous insulin , as her insulin  pump was  unavailable during admission. Potassium replacement was provided for hypokalemia, which subsequently stabilized. She remained hemodynamically stable, with no recurrence of syncope, chest pain, or shortness of breath, and no new symptoms of gastroparesis or cyclical vomiting. Imaging studies, including CT head, CTA chest, and chest x-ray, revealed no acute intracranial, cardiopulmonary, or osseous pathology. Pain was managed with PRN acetaminophen , acetaminophen /oxycodone  (Percocet), and morphine . The patient remained mobile and at low risk for DVT, so pharmacologic prophylaxis was not required. She was maintained on a regular diet with carbohydrate control and continued to be monitored for blood pressure, glycemic control, and renal function throughout her stay.    Diagnoses:  Type 2 myocardial infarction secondary to hypertension  Hypertensive emergency  Acute renal insufficiency  Hypokalemia (resolved)  Type 1 diabetes mellitus with persistent hyperglycemia  History of diabetic gastroparesis (no current symptoms)  Syncope (at presentation, no recurrence)  Chronic marijuana use  Cyclical vomiting (history, not active during admission)  Small chronic infarct, left caudate/gangliocapsular region (incidental finding on imaging)    Procedures:  * No surgery found *      Treatment Team:  Treatment Team:   Silver Raring, MD  Sean Gaither SQUIBB, MD  Hayes Costain, Itzel A        Patient Discharge Instructions, Meds & Follow-ups:  Current Discharge Medication List        START taking these medications    Details   amLODIPine  (NORVASC ) 10 MG tablet Take 1 tablet by mouth daily  Qty: 30 tablet, Refills: 3      atorvastatin  (LIPITOR) 10 MG tablet Take 1 tablet by mouth nightly  Qty: 30 tablet, Refills: 3      carvedilol  (COREG ) 12.5  MG tablet Take 1 tablet by mouth 2 times daily (with meals)  Qty: 60 tablet, Refills: 3      traMADol  (ULTRAM ) 50 MG tablet Take 1 tablet by mouth every 6 hours as needed for Pain for up to 3  days. Intended supply: 3 days. Take lowest dose possible to manage pain Max Daily Amount: 200 mg  Qty: 12 tablet, Refills: 0    Comments: Reduce doses taken as pain becomes manageable  Associated Diagnoses: Fall, initial encounter           Current Discharge Medication List        CONTINUE these medications which have NOT CHANGED    Details   Insulin  Disposable Pump (OMNIPOD 5 [id number] PODS GEN 5) MISC Apply every 2-3 Days.      Glucagon  (GVOKE HYPOPEN  2-PACK) 1 MG/0.2ML SOAJ       insulin  glulisine (APIDRA) 100 UNIT/ML injection pen Inject into the skin      Insulin  Aspart, w/Niacinamide, (FIASP PUMPCART SC) Inject into the skin      insulin  glargine (LANTUS  SOLOSTAR) 100 UNIT/ML injection pen Inject 28 Units into the skin every evening           Current Discharge Medication List        Current Discharge Medication List        STOP taking these medications       scopolamine (TRANSDERM-SCOP) transdermal patch Comments:   Reason for Stopping:         medroxyPROGESTERone (DEPO-PROVERA) 150 MG/ML injection Comments:   Reason for Stopping:               Followup Information:  Rosi Josette DEL, MD  [address]  [address]  [place]  [place] [address]  [phone]    Schedule an appointment as soon as possible for a visit       .disch    Hospital Diagnoses:  Patient Active Problem List    Diagnosis Date Noted    Elevated troponin 10/29/2023    Syncope 10/29/2023    Type 2 myocardial infarction due to hypertension (HCC) 10/29/2023    Slow transit constipation 08/08/2017    Marijuana use 08/08/2017    Intractable vomiting 08/08/2017    Gastritis 08/08/2017    Cyclical vomiting 07/09/2017    Leukocytosis 12/30/2016    Insulin  long-term use (HCC) 11/20/2016    Essential hypertension 03/27/2016    Moderate protein-calorie malnutrition 03/25/2016    DKA, type 1 (HCC) 11/05/2015    Diabetic ketoacidosis without coma associated with type 1 diabetes mellitus (HCC) 11/05/2015    Diabetic gastroparesis (HCC) 07/09/2014     Intractable nausea and vomiting 06/02/2014    Type 1 diabetes mellitus (HCC) 02/17/2009          Vitals at time of Discharge:  BP (!) 140/80   Pulse 80   Temp 97.9 F (36.6 C) (Temporal)   Resp 18   Ht 1.651 m (5' 5)   Wt 72.6 kg (160 lb)   SpO2 100%   BMI 26.63 kg/m     Labs:  CBC:   Lab Results   Component Value Date/Time    WBC 20.4 10/30/2023 04:20 AM    RBC 3.97 10/30/2023 04:20 AM    HGB 11.3 10/30/2023 04:20 AM    HCT 34.6 10/30/2023 04:20 AM    MCV 87.2 10/30/2023 04:20 AM    MCH 28.5 10/30/2023 04:20 AM    MCHC 32.7 10/30/2023 04:20 AM    RDW  44.0 10/30/2023 04:20 AM    PLT 285 10/30/2023 04:20 AM    MPV 13.4 10/30/2023 04:20 AM     CMP:    Lab Results   Component Value Date/Time    NA 133 11/01/2023 05:28 AM    K 3.9 11/01/2023 05:28 AM    CL 105 11/01/2023 05:28 AM    CO2 24 11/01/2023 05:28 AM    CO2 21.0 02/24/2018 09:48 PM    BUN 8 11/01/2023 05:28 AM    CREATININE 1.40 11/01/2023 05:28 AM    GFRAA 54.0 11/01/2023 05:28 AM    LABGLOM 45 11/01/2023 05:28 AM    GLUCOSE 215 11/01/2023 05:28 AM    CALCIUM  8.3 11/01/2023 05:28 AM    BILITOT 0.90 10/28/2023 09:37 PM    ALKPHOS 75 10/28/2023 09:37 PM    AST 15.0 10/28/2023 09:37 PM    ALT <7 10/28/2023 09:37 PM     Hepatic Function Panel:    Lab Results   Component Value Date/Time    ALKPHOS 75 10/28/2023 09:37 PM    ALT <7 10/28/2023 09:37 PM    AST 15.0 10/28/2023 09:37 PM    BILITOT 0.90 10/28/2023 09:37 PM     Albumin :  No results found for: LABALBU  Calcium :    Lab Results   Component Value Date/Time    CALCIUM  8.3 11/01/2023 05:28 AM     Ionized Calcium :  No components found for: IONCA  Magnesium :    Lab Results   Component Value Date/Time    MG 1.9 10/30/2023 04:20 AM     Phosphorus:    Lab Results   Component Value Date/Time    PHOS 3.4 10/30/2023 04:20 AM     LDH:  No results found for: LDH  Uric Acid:  No results found for: LABURIC, URICACID  PT/INR:  No results found for: PROTIME, INR  Warfarin PT/INR:  No components found  for: PTPATWAR, PTINRWAR  PTT:    Lab Results   Component Value Date/Time    APTT 23.0 10/30/2023 01:10 PM   [APTT}  Troponin:  No results found for: TROPONINI  Last 3 Troponin:  No results found for: TROPONINI  U/A:    Lab Results   Component Value Date/Time    COLORU YELLOW 08/15/2020 10:00 PM    PROTEINU 30 08/15/2020 10:00 PM    PHUR 7.0 08/15/2020 10:00 PM    WBCUA 5-9 12/24/2017 05:34 PM    RBCUA 1-4 12/24/2017 05:34 PM    BACTERIA 2+ 12/24/2017 05:34 PM    CLARITYU CLEAR 08/15/2020 10:00 PM    LEUKOCYTESUR NEGATIVE 08/15/2020 10:00 PM    BILIRUBINUR NEGATIVE 08/15/2020 10:00 PM    BLOODU NEGATIVE 08/15/2020 10:00 PM    GLUCOSEU 500 08/15/2020 10:00 PM     ABG:    Lab Results   Component Value Date/Time    PH 7.436 02/24/2018 09:48 PM    PCO2 29.4 02/24/2018 09:48 PM    PO2 116.0 02/24/2018 09:48 PM    HCO3 19.8 02/24/2018 09:48 PM    BE -4 02/24/2018 09:48 PM    O2SAT 99.0 02/24/2018 09:48 PM     VBG:  No results found for: PHVEN, PCO2VEN, BEVEN, O2SATVEN  HgBA1c:    Lab Results   Component Value Date/Time    LABA1C 9.2 02/25/2018 05:31 AM     Microalbumen/Creatinine ratio:  No components found for: RUCREAT  FLP:  No results found for: TRIG, HDL, LDLDIRECT  TSH:    Lab Results  Component Value Date/Time    TSH 1.405 08/15/2020 06:00 PM     VITAMIN B12: No components found for: B12  FOLATE:  No results found for: FOLATE  IRON:  No results found for: IRON  Iron Saturation:  No components found for: PERCENTFE  TIBC:  No results found for: TIBC  FERRITIN:  No results found for: FERRITIN  RPR:  No results found for: RPR  HIV:  No results found for: HIV  ANA:  No results found for: ANATITER, ANA  RF:  No results found for: RF  DSDNA:  No components found for: DNA  AMYLASE:  No results found for: AMYLASE  LIPASE:    Lab Results   Component Value Date/Time    LIPASE 84 03/22/2018 08:05 PM     Fibrinogen Level:  No components found for: FIB  Urine Toxicology:  No  components found for: IAMMENTA, IBARBIT, IBENZO, ICOCAINE, IMARTHC, IOPIATES, IPHENCYC  24 Hour Urine for Protein:  No components found for: RAWUPRO, UHRS3, UPRO24HR, UTV3  24 Hour Urine for Creatinine Clearance:  No components found for: CREAT4, UHRS10, UTV10  PSA: No results found for: PSA    Results       ** No results found for the last 336 hours. **             Imaging:  CTA Chest Pulmonary Embolism W Contrast  Result Date: 10/29/2023  Examination:  CTA Chest with contrast. INDICATION: Chest Pain COMPARISON: 11/01/2013. WORKSTATION ID: [id number] TECHNIQUE: CTA of the chest was performed following nonionic intravenous contrast administration. Multiplanar 3-dimensional maximum intensity projection reconstructions were performed and reviewed. DICOM format image data is available to non-affiliated external healthcare facilities or entities on a secure, media free, reciprocally searchable basis with patient authorization for 12 months following the date of the study All CT exams at this facility use one or more dose reduction techniques including automatic exposure control, mA/kV adjustment per patient's size, or iterative reconstruction technique. FINDINGS: No filling defects within the pulmonary arteries. No evidence of aneurysmal dilatation or dissection of the aorta. Heart normal size. No significant lymphadenopathy. Lungs clear. Bones intact.     IMPRESSION:. 1. No pulmonary emboli. 2. No acute abnormalities. Electronically signed by: Chevis Kid, MD 10/29/2023 12:09 AM EDT          Workstation ID: [id number]     CT Head W/O Contrast  Result Date: 10/29/2023  Examination: CT head without contrast. INDICATION: syncopal episode COMPARISON: 2019. TECHNIQUE: CT head without contrast. DICOM format image data is available to non-affiliated external healthcare facilities or entities on a secure, media free, reciprocally searchable basis with patient authorization for 12 months  following the date of the study. All CT exams at this facility use one or more dose reduction techniques including automatic exposure control, mA/kV adjustment per patient's size, or iterative reconstruction technique. WORKSTATION ID: [id number] FINDINGS: No focal mass, midline shift or acute intracranial hemorrhage. The ventricles, cisterns and other CSF-containing spaces are normal in size, shape and position for the patient's age. Mild scattered chronic small vessel ischemic change. Small chronic infarct involving the right caudate, extending to the right gangliocapsular region. Paranasal sinuses, mastoid air cells and middle ear cavities grossly unremarkable.     IMPRESSION: 1. No acute intracranial abnormalities. 2. Small chronic infarct involving the left caudate and adjacent gangliocapsular region. Electronically signed by: Chevis Kid, MD 10/29/2023 12:03 AM EDT          Workstation ID: [id number]     XR CHEST (  2 VW)  Result Date: 10/28/2023  INTERPRETATION:  XR CHEST (2 VW). INDICATION: rib pain. The cardiac silhouette is normal in size. The mediastinal contour is unremarkable. There is no parenchymal consolidation, pleural effusion or pneumothorax. Visualized osseous structures are unremarkable.     IMPRESSION:  No evidence for an acute cardiopulmonary process. Electronically signed by: Alm Smiles, MD 10/28/2023 6:57 PM EDT          Workstation ID: [id number]     XR LUMBAR SPINE (2-3 VIEWS)  Result Date: 10/28/2023  HISTORY: Fall pain 3 images No gross fracture or dislocation     IMPRESSION: No acute osseous injury Electronically signed by: Alm Smiles, MD 10/28/2023 6:55 PM EDT          Workstation ID: [id number]     CT ABD/PELVIS-IV ONLY  Result Date: 10/23/2023  EXAM: CT ABD/PELVIS-IV ONLY CLINICAL INDICATION/HISTORY: RLQ abdominal pain (Age >= 14y) COMPARISON: CT abdomen 09/01/2023. TECHNIQUE:  CT abdomen and pelvis with IV contrast. All CT scans at this facility are performed using dose  optimization technique as appropriate to the performed examination, to include automated exposure control, adjustment of the mA and/or kV according to patient's size (including appropriate matching for site-specific examinations), or use of an iterative reconstruction technique. FINDINGS: Lower chest: Negative. Liver: Hepatic steatosis. Relative hypoattenuation adjacent to the falciform ligament, favored to be focal fatty replacement. Biliary: Negative. Pancreas: Negative. Spleen: Negative. Adrenal glands: Negative. Kidneys: Stable likely extrarenal pelvises without evidence of hydronephrosis. No renal calculi. Symmetrically enhancing. Subcentimeter right renal hypodensities, favored to be benign and not require specific follow-up. Stomach, Small Bowel and Colon: Stomach is unremarkable. Mild colonic stool burden. No small bowel or colonic distention. No pericolonic stranding or bowel wall thickening. Normal appendix. Pelvic Organs: Negative Bladder: Negative. Lymph nodes: No lymphadenopathy. Vessels: Unremarkable for age. Peritoneal Spaces: Trace layering fluid in the anatomic pelvis. No free air. Body wall: Negative. Bones: No acute osseous abnormalities. 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    No acute abnormality identified. Dictated ByBETHA Von Rana on 10/23/2023 6:10 AM Signed By: Lonni DELENA Duster, MD on 10/23/2023 6:23 AM    US  PELVIC AND TRANSVAGINAL  Result Date: 10/23/2023  EXAM: Ultrasound of the Pelvis CLINICAL INDICATION/HISTORY: VAGINAL BLEEDING. TECHNIQUE: Ultrasound of the uterus and adnexa performed transabdominally and transvaginally. COMPARISON: None FINDINGS: The uterus measures 8 point 4 x 4 0.2 x 4.1 cm. The myometrium is unremarkable. The endometrial stripe is 0.6 cm. No complication identified. The cervix has nabothian cysts. Trace free fluid identified in the pelvis. No fluid collection appreciated in the pelvis. No mass lesion appreciated the pelvis. Right ovary: Right ovary is obscured by  bowel gas. No adnexal mass appreciated. Left ovary:  It measures 2.9 x 1.6 x 1.8 cm. Internal vascularity is present. Normal color and spectral Doppler identified with flow.A cyst is noted measuring 0.9 x 0.5 x 1 cm. No adnexal mass identified.    1. No acute pathology appreciated in the uterus or adnexa Signed By: Lamar JONETTA Scull, MD on 10/23/2023 3:18 AM      Condition at discharge: Stable for discharge   Disposition:  Home    Sonny Simpers, MD  [facility]  Phone [phone]  Pager [phone]  Fax [phone]    Please use secure chat for non-urgent matters from 7am-7pm  When possible please assign scheduled nurse to secure chat for MD/RN communications.  For URGENT MATTERS: Please page [phone]     11/01/2023, 3:07 PM    Billing:  Level  of Care/Complexity or Time spent in Chart Review, Patient Interview, Exam, Counseling, Chart Completion,Care Coordination:   Discharge 00760 - > 30 min         "

## 2023-11-01 NOTE — Progress Notes (Signed)
 "[facility] ([facility]   CARDIOLOGY PROGRESS NOTE  RECS:  TYPE 2 MI due to HTN             Troponin 122->101->114              Not ACS              BP goal < 130/80 preferably 120/60               Amlodipine  10              Carvedilol  12.5 BID              Lisinopril  restart at 2.5 for renal protection              ECHO without RWMA              Start atorvastatin  10         BLACKED OUT              No dysrhythmia              Likely due to uncontrolled BG         DM              Saline infusion running                         Insulin  therapy to A1C <7.0                  226- 51- 96                    OK from Cardiology to discharge. F/U with PCP for BP and BG control        ASSESSMENT:  [name] [name] is a 38 y.o. female  with passing out  Hospital Problems           Last Modified POA    * (Principal) Type 2 myocardial infarction due to hypertension (HCC) 10/29/2023 Yes    Overview Addendum 10/29/2023 12:21 PM by Irish Million, MD   10/28/23 ER ED complaining of syncope and fall. Patient states that she was going down the steps today and just blacked out causing her to trip and fall on her right side down 4 steps and I slid down. She denies any her head but states that she did hurt the right side of her back. Also is unsure why she blacked out. States that her blood pressure has been high all week in the 190s systolic. 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  EKG NSR LVH with strain vs IL ischemia  10/29/23 ECHO EF 69%, LV 20/32, S/PW 15/15, LVMI 93 gms/m2, LAVI 16, No MR/TR, No RWMA         Elevated troponin 10/29/2023 Yes    Overview Addendum 10/29/2023 10:40 AM by Junie Barnacle R, PA-C   09/28/2020 ECHO EF 61%.  Mod thickened septal wall and severely thickened posterior wall. No VHD.   10/29/2023 ECHO EF 69%. Mod cLVH. No WMA.          Syncope 10/29/2023 Yes  Overview Addendum 10/29/2023 10:45 AM by Junie Elida SAUNDERS, PA-C   10/29/2023 Patient presented to ER 10/28/2023 with syncope. She states she was going down the stairs when she blacked out. Fell down stairs. Denies head trauma. Endorses BP in 190's systolic all week. BP 220/102. Hs troponin 122 > 101 > 114,  EKG 10/28/2023: NSR with LVH and inferolateral ST/TW abnormalities and anterior mild ST elevation. HR 100 QTcB 461. Scr 1.46, CXR unremarkable. CT head 10/29/2023:  No acute intracranial abnormalities. Small chronic infarct involving the left caudate and adjacent gangliocapsular region         Marijuana use 10/29/2023 Yes    Cyclical vomiting 10/29/2023 Yes    Diabetic gastroparesis (HCC) 10/29/2023 Yes    Overview Signed 04/14/2020  7:14 PM by Tobie Passy, MD   S/P implantation of gastric pacemaker 06/11/15 in [place] . Was supposed   to follow-up with implanting gastroenterologist and [facility].           Type 1 diabetes mellitus (HCC) 10/29/2023 Yes    Overview Signed 04/15/2020  5:08 AM by Edi, Convprob1   Diagnosed at age 89. Had insulin  pump at one time, but developed keloids   at needle insertion sites. Was supposed to establish with [facility].              SUBJECTIVE:  No CP or SOB    VS:   Vitals:    11/01/23 0024 11/01/23 0507 11/01/23 0717 11/01/23 1121   BP: 127/76 136/85 121/76 (!) 140/80   Pulse: 80 80 78 80   Resp: 18 18 17 18    Temp: 98.9 F (37.2 C) 98.6 F (37 C) 97.5 F (36.4 C) 97.9 F (36.6 C)   TempSrc: Temporal Temporal Temporal Temporal   SpO2: 100% 100% 100% 100%   Weight:       Height:           Body mass index is 26.63 kg/m.     Intake/Output Summary (Last 24 hours) at 11/01/2023 1333  Last data filed at 11/01/2023 1121  Gross per 24 hour   Intake 350 ml   Output 0 ml   Net 350 ml     I/O last 3 completed shifts:  In: 340  [P.O.:340]  Out: 0   I/O this shift:  In: 250 [P.O.:250]  Out: 0       TELE personally reviewed by me:  sinus    ROS: Positives BOLDED  GEN: fever, chills sweats  CVS: Chest pain, palpitations, DOE, PND, orthopnea, claudication  RESP: epistaxis, SOB, wheezing, productive cough  GI: melena, hematochezia, hematemesis, N/V, diarrhea, constipation  GU: hematuria, dysuria    EXAM:  General:  Skin warm, perfusion adequate  Neck: JVD is absent, carotids without bruits  Lungs: clear, no rales, no rhonchi   Cardiac:  Regular Rhythm, No murmur, Gallops No Rubs  Abdomen: soft, nl bowel sounds   Ext: Edema is absent  Pulses: + 2 symmetrical  Neuro: Alert and oriented; Nonfocal defects    Labs:  Basic Metabolic Profile   Lab Results   Component Value Date    NA 133 (L) 11/01/2023    K 3.9 11/01/2023    CL 105 11/01/2023    CO2 24 11/01/2023    BUN 8 (L) 11/01/2023    CREATININE 1.40 (H) 11/01/2023    GLUCOSE 215 (H) 11/01/2023    CALCIUM  8.3 (L) 11/01/2023    BILITOT 0.90 10/28/2023    ALKPHOS 75 10/28/2023  AST 15.0 10/28/2023    ALT <7 (L) 10/28/2023    LABGLOM 45 11/01/2023    GFRAA 54.0 11/01/2023         Lab Results   Component Value Date/Time    NA 133 11/01/2023 05:28 AM    K 3.9 11/01/2023 05:28 AM    CL 105 11/01/2023 05:28 AM    CO2 24 11/01/2023 05:28 AM    CO2 21.0 02/24/2018 09:48 PM    BUN 8 11/01/2023 05:28 AM    CREATININE 1.40 11/01/2023 05:28 AM    GLUCOSE 215 11/01/2023 05:28 AM    CALCIUM  8.3 11/01/2023 05:28 AM    LABGLOM 45 11/01/2023 05:28 AM      Lab Results   Component Value Date    CREATININE 1.40 (H) 11/01/2023    CREATININE 1.62 (H) 10/30/2023    CREATININE 1.46 (H) 10/28/2023     Lab Results   Component Value Date/Time    MG 1.9 10/30/2023 04:20 AM        CBC w/Diff    Recent Labs     10/30/23  0420   WBC 20.4*   RBC 3.97   HCT 34.6*   MCV 87.2   MCH 28.5   MCHC 32.7   RDW 44.0   MPV 13.4*    Recent Labs     10/30/23  0420   RDW 44.0      @LASTHABA1C @  No results found for: CHOL  No results found  for: TRIG  No results found for: HDL  No results found for: LDL  No results found for: VLDL  No results found for: [facility]      Cardiac Enzymes   Lab Results   Component Value Date/Time    TROPHS 114 10/29/2023 05:23 AM    TROPHS 101 10/29/2023 01:41 AM    TROPHS 122 10/28/2023 09:37 PM     Lab Results   Component Value Date    TROPHS 114 (HH) 10/29/2023     @30606 @  No results found for: BNP         Coagulation   INR/Prothrombin Time   No results found for: IRON, TIBC, FERRITIN  Lab Results   Component Value Date    TSH 1.405 08/15/2020    T4FREE 1.59 10/28/2023       No results found for: LVEF, LVEFMODE          Medications:  @CMEDSCH @    ALVERNA QUILL, MD                        November 01, 2023                            1:33 PM    @Desaray Marschner  PHEBE QUILL, MD, [facility]  [facility]  Phone:  [phone](office)  Cell: [phone]  Pager: [phone]          "

## 2023-11-30 NOTE — Telephone Encounter (Signed)
"  [facility]: Medicare part B insulin  DME  "

## 2023-12-07 ENCOUNTER — Emergency Department: Admit: 2023-12-08 | Payer: MEDICARE | Primary: Family Medicine

## 2023-12-07 DIAGNOSIS — R55 Syncope and collapse: Principal | ICD-10-CM

## 2023-12-07 NOTE — ED Notes (Signed)
"  Medicated per [facility]     Sarver, Candyce LABOR, RN  12/08/23 0105    "

## 2023-12-07 NOTE — ED Triage Notes (Signed)
"  Pt ambulatory into triage with c/o My back has been hurting since Saturday. I have stage III kidney diseases  Today I passed out in my kitchen, and now my whole back hurts. I keep having pressure when I pee. My sugar has been really high since yesterday.     Pt denies head and neck pain and blood thinners.    Past Medical History:   Diagnosis Date    Cyclical vomiting 07/09/2017    Diabetic ketoacidosis without coma associated with type 1 diabetes mellitus (HCC) 11/05/2015    Drug-seeking behavior     Essential hypertension 03/27/2016    Amlodipine , carvedilol , lisinopril .    Gastroparesis     Insulin  long-term use (HCC) 11/20/2016    Slow transit constipation 08/08/2017    Type 1 diabetes mellitus (HCC) 02/17/2009    Diagnosed at age 75. Had insulin  pump at one time, but developed keloids at needle insertion sites. Was supposed to establish with [facility].       "

## 2023-12-07 NOTE — ED Provider Notes (Signed)
 "[facility]  Emergency Department Treatment Report        Patient: [name] [name] Age: 38 y.o. Sex: female    Date of Birth: 10-21-1985 Admit Date: 12/07/2023 PCP: Rosi Josette DEL, MD   MRN: [id number]  CSN: [id number]     Room: ER12/ER12 Time Dictated: 3:29 AM        Dragon medical dictation software was used for portions of this report.  Unintended transcription errors may occur.      Chief Complaint   Chief Complaint   Patient presents with    Loss of Consciousness    Back Pain       History of Present Illness   This is a 38 y.o. female with a history of poorly controlled diabetes, cyclic vomiting syndrome, hypertension, presents today for evaluation of back pain.  Patient describes as aching, has been ongoing since Saturday.  Hurts to move, hurts to flex, today while she was in the kitchen, she got lightheaded, and believes that she passed out.  Did not hit her head,.  Has had no nausea or vomiting.  She states that her blood sugars have been high since yesterday.  Denies dysuria, frequency, urgency, she has no flank pain.    Review of Systems   Review of Systems   Constitutional:  Negative for chills, fever and unexpected weight change.   Respiratory:  Negative for chest tightness, shortness of breath and wheezing.    Cardiovascular:  Negative for chest pain.   Gastrointestinal:  Negative for abdominal pain, nausea and vomiting.   Genitourinary:  Negative for dysuria and frequency.   Skin:  Negative for rash.   Neurological:  Positive for syncope. Negative for weakness and headaches.         Past Medical/Surgical History     Past Medical History:   Diagnosis Date    Cyclical vomiting 07/09/2017    Diabetic ketoacidosis without coma associated with type 1 diabetes mellitus (HCC) 11/05/2015    Drug-seeking behavior     Essential hypertension 03/27/2016    Amlodipine , carvedilol , lisinopril .    Gastroparesis     Insulin  long-term use (HCC) 11/20/2016    Slow transit constipation 08/08/2017    Type 1  diabetes mellitus (HCC) 02/17/2009    Diagnosed at age 71. Had insulin  pump at one time, but developed keloids at needle insertion sites. Was supposed to establish with [facility].     Past Surgical History:   Procedure Laterality Date    CESAREAN SECTION      PR UNLISTED PROCEDURE ABDOMEN PERITONEUM & OMENTUM  05/2015    Gastric stimulator pump remover in 2021    SALPINGECTOMY Bilateral 11/28/2022    LAPAROSCOPIC BILATERAL SALPINGECTOMY performed by Trudy Prentice LABOR, MD at [facility] MAIN OR       Social History     Social History     Socioeconomic History    Marital status: Single     Spouse name: Not on file    Number of children: Not on file    Years of education: Not on file    Highest education level: Not on file   Occupational History    Not on file   Tobacco Use    Smoking status: Never    Smokeless tobacco: Never   Vaping Use    Vaping status: Never Used   Substance and Sexual Activity    Alcohol use: Not Currently    Drug use: Not Currently     Types:  Marijuana Oda)    Sexual activity: Not on file   Other Topics Concern    Not on file   Social History Narrative    Not on file     Social Drivers of Health     Financial Resource Strain: Not on file   Food Insecurity: No Food Insecurity (10/29/2023)    Hunger Vital Sign     Worried About Running Out of Food in the Last Year: Never true     Ran Out of Food in the Last Year: Never true   Transportation Needs: No Transportation Needs (10/29/2023)    PRAPARE - Therapist, Art (Medical): No     Lack of Transportation (Non-Medical): No   Physical Activity: Not on file   Stress: Not on file   Social Connections: Not on file   Intimate Partner Violence: Not At Risk (01/09/2023)    Received from [facility]    Humiliation, Afraid, Rape, and Kick questionnaire     Within the last year, have you been afraid of your partner or ex-partner?: No     Within the last year, have you been humiliated or emotionally abused in other ways by your partner  or ex-partner?: No     Within the last year, have you been kicked, hit, slapped, or otherwise physically hurt by your partner or ex-partner?: No     Within the last year, have you been raped or forced to have any kind of sexual activity by your partner or ex-partner?: No   Housing Stability: Low Risk  (10/29/2023)    Housing Stability Vital Sign     Unable to Pay for Housing in the Last Year: No     Number of Times Moved in the Last Year: 0     Homeless in the Last Year: No       Family History     Family History   Problem Relation Age of Onset    Stroke Other         great grandmother    Diabetes Father        Current Medications     Current Facility-Administered Medications   Medication Dose Route Frequency Provider Last Rate Last Admin    dextrose  50 % IV solution  25 g IntraVENous NOW Anjolina Byrer, Dorn LABOR, MD         Current Outpatient Medications   Medication Sig Dispense Refill    amLODIPine  (NORVASC ) 10 MG tablet Take 1 tablet by mouth daily 30 tablet 3    atorvastatin  (LIPITOR) 10 MG tablet Take 1 tablet by mouth nightly 30 tablet 3    carvedilol  (COREG ) 12.5 MG tablet Take 1 tablet by mouth 2 times daily (with meals) 60 tablet 3    Insulin  Disposable Pump (OMNIPOD 5 DEXG7G6 PODS GEN 5) MISC Apply every 2-3 Days.      Glucagon  (GVOKE HYPOPEN  2-PACK) 1 MG/0.2ML SOAJ       insulin  glulisine (APIDRA) 100 UNIT/ML injection pen Inject into the skin      Insulin  Aspart, w/Niacinamide, (FIASP PUMPCART SC) Inject into the skin      insulin  glargine (LANTUS  SOLOSTAR) 100 UNIT/ML injection pen Inject 28 Units into the skin every evening         Allergies     Allergies   Allergen Reactions    Toradol  [Ketorolac  Tromethamine ] Swelling     Tongue swelling    Methocarbamol Other (See Comments)     Body shaking,  nausea    Azithromycin Hives    Penicillins Hives       Physical Exam   Patient Vitals for the past 24 hrs:   Temp Pulse Resp BP SpO2   12/08/23 0237 -- -- -- (!) 146/72 --   12/07/23 2219 98.4 F (36.9 C) 87 18 (!)  145/69 100 %   12/07/23 1949 98.1 F (36.7 C) 95 18 (!) 159/80 100 %     Physical Exam  Constitutional:       Appearance: Normal appearance.   HENT:      Head: Normocephalic and atraumatic.      Nose: No rhinorrhea.      Mouth/Throat:      Mouth: Mucous membranes are moist.   Eyes:      Conjunctiva/sclera: Conjunctivae normal.   Cardiovascular:      Rate and Rhythm: Normal rate and regular rhythm.      Pulses: Normal pulses.      Heart sounds: Normal heart sounds.   Pulmonary:      Effort: Pulmonary effort is normal.      Breath sounds: Normal breath sounds.   Abdominal:      General: Abdomen is flat.      Palpations: Abdomen is soft.      Tenderness: There is no abdominal tenderness.   Musculoskeletal:         General: Tenderness present. No swelling.      Comments: Tender on palpation to the paraspinal thoracic musculature, no midline C/T/L-spine pain.  No fine skin change, no rash.  Pain is completely reproducible on palpation.   Skin:     General: Skin is warm and dry.   Neurological:      General: No focal deficit present.      Mental Status: She is alert.   Psychiatric:         Mood and Affect: Mood normal.              Impression and Management Plan   38 year old female with history of chronic cyclic vomiting syndrome, gastroparesis, diabetes mellitus, here today for evaluation of back pain which on examination seems very superficial musculoskeletal in nature, I have no concern for central infection or CNS pathology, in regards to the syncope, differential include dysrhythmia, metabolic derangement, anemia, will check CBC, BMP, EKG, cardiac monitor.      Diagnostic Studies   Lab and imaging:     Results for orders placed or performed during the hospital encounter of 12/07/23   XR CHEST (2 VW)    Narrative    EXAM: XR CHEST (2 VW)    CLINICAL INDICATION/HISTORY: 38 years Female. Pain.  Additional History: None    TECHNIQUE: PA and Lateral view of the chest    COMPARISON:  10/28/2023.    FINDINGS:    LINES/TUBES/DEVICES: none.    HEART/MEDIASTINUM: Unchanged mediastinal silhouette.    LUNGS/PLEURA: There is no confluent consolidation. There is no evidence of  pleural effusion. No pneumothorax.    BONY STRUCTURES/SOFT TISSUES: There is no evidence of acute osseous abnormality.        Impression    IMPRESSION:    No radiographic evidence of acute cardiopulmonary process.    Electronically signed by: Dreama Marcus, MD 12/07/2023 8:39 PM EST            Workstation ID: [id number]     CBC with Auto Differential   Result Value Ref Range    WBC 9.8 4.0 - 11.0 1000/mm3  RBC 4.19 3.60 - 5.20 M/uL    Hemoglobin 12.1 11.0 - 16.0 gm/dl    Hematocrit 63.5 64.9 - 47.0 %    MCV 86.9 80.0 - 98.0 fL    MCH 28.9 25.4 - 34.6 pg    MCHC 33.2 30.0 - 36.0 gm/dl    Platelets 754 859 - 450 1000/mm3    MPV 12.1 (H) 6.0 - 10.0 fL    RDW 44.3 36.4 - 46.3      Nucleated RBCs 0 0 - 0      Neutrophils Segmented 78.0 (H) 34 - 64 %    Lymphocytes 17.0 (L) 28 - 48 %    Monocytes 3.0 1 - 13 %    Eosinophils 2.0 0 - 5 %    Platelet Appearance NORMAL     Basic Metabolic Panel   Result Value Ref Range    Potassium 4.2 3.5 - 5.1 mEq/L    Chloride 104 98 - 107 mEq/L    Sodium 134 (L) 136 - 145 mEq/L    CO2 24 20 - 31 mEq/L    Glucose 527 (HH) 74 - 106 mg/dl    BUN 9 9 - 23 mg/dl    Creatinine 8.44 (H) 0.55 - 1.02 mg/dl    GFR African American 48.0      GFR Non-African American 40      Calcium  9.2 8.7 - 10.4 mg/dl    Anion Gap 6 5 - 15 mmol/L   Troponin   Result Value Ref Range    Troponin, High Sensitivity 50 (H) 0 - 34 ng/L   Troponin   Result Value Ref Range    Troponin, High Sensitivity 44 (H) 0 - 34 ng/L   POCT Glucose   Result Value Ref Range    POC Glucose 528 (HH) 65 - 105 mg/dL   POCT Urinalysis no Micro   Result Value Ref Range    Glucose, Ur >=1000 (A) NEGATIVE,Negative mg/dl    Bilirubin, Urine Negative NEGATIVE,Negative      Ketones, Urine Negative NEGATIVE,Negative mg/dl    Specific Gravity, UA 1.010  1.005 - 1.030      Blood, Urine Trace-intact (A) NEGATIVE,Negative      pH, Urine 6.5 5 - 9      Protein, Urine Negative NEGATIVE,Negative mg/dl    Urobilinogen, Urine 0.2 0.0 - 1.0 EU/dl    Nitrite, Urine Negative NEGATIVE,Negative      Leukocyte Esterase, Urine Negative NEGATIVE,Negative      Color, UA Yellow      Clarity, UA Clear     POC Pregnancy Urine Qual   Result Value Ref Range    Pregnancy, Urine negative NEGATIVE,Negative,negative     POCT Glucose   Result Value Ref Range    POC Glucose 345 (H) 65 - 105 mg/dL   POCT Glucose   Result Value Ref Range    POC Glucose 51 (L) 65 - 105 mg/dL   POCT Glucose   Result Value Ref Range    POC Glucose 41 (LL) 65 - 105 mg/dL   POCT Glucose   Result Value Ref Range    POC Glucose 168 (H) 65 - 105 mg/dL   POCT Glucose   Result Value Ref Range    POC Glucose 133 (H) 65 - 105 mg/dL         EKG: Based upon my personal interpretation EKG shows a normal sinus rhythm with a ventricular rate of 80 bpm with no ST elevations, no depressions, there  is LVH with strain type pattern which is chronic and unchanged from baseline EKG from 28 October 2023.      Cardiac Monitor Interpretation: Based upon my personal interpretation the monitor shows a normal sinus rhythm with a rate of 87 bpm.    Imaging: Based on my personal interpretation, the chest x-ray shows no infiltrates and no pneumothorax    Other studies:  My interpretation of other studies is that they show, among other things, blood sugar is elevated at 527, creatinine is 1.5 which is around baseline.    ED Course     ED Course as of 12/08/23 0329   Tue Dec 08, 2023   0058 Troponin is 42 which is down from last admission, blood sugar trending downward to 345.   Plan for repeat troponin, if negative, she has had no dysrhythmia here, her EKG shows no ischemia, can be discharged to home if her repeat troponin is flat/stable. [JR]   0223 Patient had a drop in her blood sugar just prior to attempted discharge, she is awake, she is  mentating, had her drink some juice, was given amp of dextrose  and her blood sugar has improved.  Will continue to monitor. [JR]      ED Course User Index  [JR] Jacob Dorn LABOR, MD        RECORDS REVIEWED:  I reviewed the patient's previous records here at [facility] and available outside facilities and note that as above, patient with mild chronic kidney disease, chronically poorly controlled diabetes mellitus.      External results reviewed: Patient is known to multiple hospitals in this area, has a known history of chronic lumbar back pain, and history of narcotic seeking,     INDEPENDENT HISTORIAN:  History and/or plan development assisted by: Paramedics    Threat to body function without evaluation and management: Endocrine, MSK      Comorbidities impacting Evaluation and Management: Diabetes mellitus, chronic back pain      Medications   dextrose  50 % IV solution (25 g IntraVENous Not Given 12/08/23 0200)   acetaminophen  (TYLENOL ) tablet 1,000 mg (1,000 mg Oral Given 12/07/23 2239)   sodium chloride  0.9 % bolus 1,000 mL (0 mLs IntraVENous Stopped 12/08/23 0004)   insulin  regular (HumuLIN  R;NovoLIN  R) injection 15 Units (15 Units SubCUTAneous Given 12/07/23 2239)   dextrose  50 % IV solution (25 g IntraVENous Given 12/08/23 0159)       NARRATIVE:  Blood sugar has bounced back up, vital signs are stable,, discharged to home, recommended Tylenol  as needed for her lumbar back pain, primary care follow-up.  She is advised to healthy diabetic diet, and close closely monitoring.        Final Diagnosis       ICD-10-CM    1. Syncope and collapse  R55       2. Bilateral low back pain without sciatica, unspecified chronicity  M54.50       3. Hyperglycemia  R73.9           Disposition   Discharge to home      Dorn Jacob, MD [facility]  December 08, 2023    My signature above authenticates this document and my orders, the final    diagnosis (es), discharge prescription (s), and instructions in the Epic    record.  If you have  any questions please contact [phone].     Nursing notes have been reviewed by the physician/ advanced practice    Clinician.  Jacob Dorn LABOR, MD  12/08/23 [phone]    "

## 2023-12-07 NOTE — ED Notes (Signed)
"  Assumed care     Blaise Candyce LABOR, RN  12/08/23 0105    "

## 2023-12-08 ENCOUNTER — Inpatient Hospital Stay
Admit: 2023-12-08 | Discharge: 2023-12-08 | Disposition: A | Discharge status: Home / Self Care | Payer: MEDICARE | Arrived: VH | Attending: Emergency Medicine

## 2023-12-08 LAB — CBC WITH AUTO DIFFERENTIAL
Eosinophils: 2 % (ref 0–5)
Hematocrit: 36.4 % (ref 35.0–47.0)
Hemoglobin: 12.1 g/dL (ref 11.0–16.0)
Lymphocytes: 17 % — ABNORMAL LOW (ref 28–48)
MCH: 28.9 pg (ref 25.4–34.6)
MCHC: 33.2 g/dL (ref 30.0–36.0)
MCV: 86.9 fL (ref 80.0–98.0)
MPV: 12.1 fL — ABNORMAL HIGH (ref 6.0–10.0)
Monocytes: 3 % (ref 1–13)
Neutrophils Segmented: 78 % — ABNORMAL HIGH (ref 34–64)
Nucleated RBCs: 0 (ref 0–0)
Platelet Appearance: NORMAL
Platelets: 245 1000/mm3 (ref 140–450)
RBC: 4.19 M/uL (ref 3.60–5.20)
RDW: 44.3 (ref 36.4–46.3)
WBC: 9.8 1000/mm3 (ref 4.0–11.0)

## 2023-12-08 LAB — BASIC METABOLIC PANEL
Anion Gap: 6 mmol/L (ref 5–15)
BUN: 9 mg/dL (ref 9–23)
CO2: 24 meq/L (ref 20–31)
Calcium: 9.2 mg/dL (ref 8.7–10.4)
Chloride: 104 meq/L (ref 98–107)
Creatinine: 1.55 mg/dL — ABNORMAL HIGH (ref 0.55–1.02)
GFR African American: 48
GFR Non-African American: 40
Glucose: 527 mg/dL (ref 74–106)
Potassium: 4.2 meq/L (ref 3.5–5.1)
Sodium: 134 meq/L — ABNORMAL LOW (ref 136–145)

## 2023-12-08 LAB — EKG 12-LEAD
Atrial Rate: 83 {beats}/min
Calculated P Axis: 74 degrees
Calculated R Axis: 69 degrees
Calculated T Axis: 123 degrees
DIAGNOSIS, 93000: NORMAL
P-R Interval: 132 ms
Q-T Interval: 366 ms
QRS Duration: 86 ms
QTC Calculation (Bezet): 430 ms
Ventricular Rate: 83 {beats}/min

## 2023-12-08 LAB — POCT URINALYSIS DIPSTICK
Bilirubin, Urine: NEGATIVE
Glucose, Ur: >=1000 mg/dL — AB
Ketones, Urine: NEGATIVE mg/dL
Leukocyte Esterase, Urine: NEGATIVE
Nitrite, Urine: NEGATIVE
Protein, Urine: NEGATIVE mg/dL
Specific Gravity, UA: 1.01 (ref 1.005–1.030)
Urobilinogen, Urine: 0.2 EU/dl (ref 0.0–1.0)
pH, Urine: 6.5 (ref 5–9)

## 2023-12-08 LAB — TROPONIN
Troponin, High Sensitivity: 50 ng/L — ABNORMAL HIGH (ref 0–34)
Troponin, High Sensitivity: 44 ng/L — ABNORMAL HIGH (ref 0–34)

## 2023-12-08 LAB — POC PREGNANCY UR-QUAL: Pregnancy, Urine: NEGATIVE

## 2023-12-08 LAB — POCT GLUCOSE
POC Glucose: 528 mg/dL (ref 65–105)
POC Glucose: 345 mg/dL — ABNORMAL HIGH (ref 65–105)
POC Glucose: 168 mg/dL — ABNORMAL HIGH (ref 65–105)
POC Glucose: 41 mg/dL — CL (ref 65–105)
POC Glucose: 51 mg/dL — ABNORMAL LOW (ref 65–105)
POC Glucose: 133 mg/dL — ABNORMAL HIGH (ref 65–105)

## 2023-12-08 MED ORDER — SODIUM CHLORIDE 0.9 % IV BOLUS
0.9 | Freq: Once | INTRAVENOUS | Status: AC
Start: 2023-12-08 — End: 2023-12-08
  Administered 2023-12-08: 04:00:00 1000 mL via INTRAVENOUS

## 2023-12-08 MED ORDER — DEXTROSE 50 % IV SOLN
50 | INTRAVENOUS | Status: DC
Start: 2023-12-08 — End: 2023-12-08

## 2023-12-08 MED ORDER — DEXTROSE 50 % IV SOLN
50 | INTRAVENOUS | Status: AC
Start: 2023-12-08 — End: 2023-12-08
  Administered 2023-12-08: 07:00:00 25 via INTRAVENOUS

## 2023-12-08 MED ORDER — DEXTROSE 50 % IV SOLN
50 | INTRAVENOUS | Status: AC
Start: 2023-12-08 — End: 2023-12-08

## 2023-12-08 MED ORDER — INSULIN REGULAR HUMAN 100 UNIT/ML IJ SOLN
100 | INTRAMUSCULAR | Status: AC
Start: 2023-12-08 — End: 2023-12-07
  Administered 2023-12-08: 04:00:00 15 [IU] via SUBCUTANEOUS

## 2023-12-08 MED ORDER — ACETAMINOPHEN 500 MG PO TABS
500 | ORAL | Status: AC
Start: 2023-12-08 — End: 2023-12-07
  Administered 2023-12-08: 04:00:00 1000 mg via ORAL

## 2023-12-08 MED FILL — DEXTROSE 50 % IV SOLN: 50 % | INTRAVENOUS | Qty: 50

## 2023-12-08 MED FILL — HUMULIN R 100 UNIT/ML IJ SOLN: 100 [IU]/mL | INTRAMUSCULAR | Qty: 15 | Fill #0

## 2023-12-08 MED FILL — ACETAMINOPHEN EXTRA STRENGTH 500 MG PO TABS: 500 mg | ORAL | Qty: 2 | Fill #0

## 2023-12-08 NOTE — ED Notes (Signed)
"  Called lab regarding troponin  Tech stated they were behind and hadnt opened bag yet  States will start running now    Pt ambulatory to restroom x2  C/O back pain  Romash, MD notified  No new orders at this time      Blaise Candyce LABOR, RN  12/08/23 0107    "

## 2023-12-08 NOTE — ED Notes (Signed)
"  Report given to Karna OBIE Blaise Candyce DELENA, RN  12/08/23 [phone]    "

## 2023-12-08 NOTE — ED Notes (Signed)
"  3:31 AM  12/08/23    Discharge instructions given to [name] [name] with verbalization of understanding.  Pt accompanied by mother.  Pt discharged with the following prescriptions      Medication List        CONTINUE taking these medications      Omnipod 5 DexG7G6 Pods Gen 5 Misc            ASK your doctor about these medications      amLODIPine  10 MG tablet  Commonly known as: NORVASC   Take 1 tablet by mouth daily     atorvastatin  10 MG tablet  Commonly known as: LIPITOR  Take 1 tablet by mouth nightly     carvedilol  12.5 MG tablet  Commonly known as: COREG   Take 1 tablet by mouth 2 times daily (with meals)     FIASP PUMPCART SC     Gvoke HypoPen  2-Pack 1 MG/0.2ML Soaj  Generic drug: Glucagon      insulin  glulisine 100 UNIT/ML injection pen  Commonly known as: APIDRA     Lantus  SoloStar 100 UNIT/ML injection pen  Generic drug: insulin  glargine          .  Pt discharged to home.    Karna Falter, RN, BSN     [place], [place]  12/08/23 [phone]    "

## 2023-12-08 NOTE — ED Notes (Signed)
"  Pt c/o feeling like her blood sugar was low  POC glucose completed  51- given 4oz juice and graham crackers    20 mins later, recheck   41- d50 given along with breakfast box and oreo cookies    Recheck- 151    Will recheck in one hour     Blaise Candyce LABOR, RN  12/08/23 0221    "

## 2023-12-08 NOTE — Discharge Instructions (Signed)
 "Please follow up with your primary care doctor this week for re-examination. Please return to the ER for re-evaluation should you develop any new or concerning symptoms.     Results for orders placed or performed during the hospital encounter of 12/07/23   CBC with Auto Differential   Result Value Ref Range    WBC 9.8 4.0 - 11.0 1000/mm3    RBC 4.19 3.60 - 5.20 M/uL    Hemoglobin 12.1 11.0 - 16.0 gm/dl    Hematocrit 63.5 64.9 - 47.0 %    MCV 86.9 80.0 - 98.0 fL    MCH 28.9 25.4 - 34.6 pg    MCHC 33.2 30.0 - 36.0 gm/dl    Platelets 754 859 - 450 1000/mm3    MPV 12.1 (H) 6.0 - 10.0 fL    RDW 44.3 36.4 - 46.3      Nucleated RBCs 0 0 - 0      Neutrophils Segmented 78.0 (H) 34 - 64 %    Lymphocytes 17.0 (L) 28 - 48 %    Monocytes 3.0 1 - 13 %    Eosinophils 2.0 0 - 5 %    Platelet Appearance NORMAL     Basic Metabolic Panel   Result Value Ref Range    Potassium 4.2 3.5 - 5.1 mEq/L    Chloride 104 98 - 107 mEq/L    Sodium 134 (L) 136 - 145 mEq/L    CO2 24 20 - 31 mEq/L    Glucose 527 (HH) 74 - 106 mg/dl    BUN 9 9 - 23 mg/dl    Creatinine 8.44 (H) 0.55 - 1.02 mg/dl    GFR African American 48.0      GFR Non-African American 40      Calcium  9.2 8.7 - 10.4 mg/dl    Anion Gap 6 5 - 15 mmol/L   Troponin   Result Value Ref Range    Troponin, High Sensitivity 50 (H) 0 - 34 ng/L   Troponin   Result Value Ref Range    Troponin, High Sensitivity 44 (H) 0 - 34 ng/L   POCT Glucose   Result Value Ref Range    POC Glucose 528 (HH) 65 - 105 mg/dL   POCT Urinalysis no Micro   Result Value Ref Range    Glucose, Ur >=1000 (A) NEGATIVE,Negative mg/dl    Bilirubin, Urine Negative NEGATIVE,Negative      Ketones, Urine Negative NEGATIVE,Negative mg/dl    Specific Gravity, UA 1.010 1.005 - 1.030      Blood, Urine Trace-intact (A) NEGATIVE,Negative      pH, Urine 6.5 5 - 9      Protein, Urine Negative NEGATIVE,Negative mg/dl    Urobilinogen, Urine 0.2 0.0 - 1.0 EU/dl    Nitrite, Urine Negative NEGATIVE,Negative      Leukocyte Esterase, Urine  Negative NEGATIVE,Negative      Color, UA Yellow      Clarity, UA Clear     POC Pregnancy Urine Qual   Result Value Ref Range    Pregnancy, Urine negative NEGATIVE,Negative,negative     POCT Glucose   Result Value Ref Range    POC Glucose 345 (H) 65 - 105 mg/dL   POCT Glucose   Result Value Ref Range    POC Glucose 51 (L) 65 - 105 mg/dL     XR CHEST (2 VW)  Result Date: 12/07/2023  EXAM: XR CHEST (2 VW) CLINICAL INDICATION/HISTORY: 38 years Female. Pain. Additional History: None TECHNIQUE: PA and  Lateral view of the chest COMPARISON: 10/28/2023. FINDINGS: LINES/TUBES/DEVICES: none. HEART/MEDIASTINUM: Unchanged mediastinal silhouette. LUNGS/PLEURA: There is no confluent consolidation. There is no evidence of pleural effusion. No pneumothorax. BONY STRUCTURES/SOFT TISSUES: There is no evidence of acute osseous abnormality.     IMPRESSION: No radiographic evidence of acute cardiopulmonary process. Electronically signed by: Dreama Marcus, MD 12/07/2023 8:39 PM EST          Workstation ID: [id number]       "

## 2023-12-08 NOTE — ED Notes (Signed)
"  Sugar rechecked   Second trop sent     Blaise Candyce LABOR, RN  12/08/23 0106    "

## 2024-01-03 NOTE — ED Notes (Signed)
"  Assumed care to discharge.  Post op shoe to left foot.  Crutches given.  Teaching complete with rectum demonstration.    Pain assessment on discharge was 9.  Condition stable.  Patient discharged to home.  Patient education was completed:  yes  Education taught to:  patient  Teaching method used was discussion and handout.  Understanding of teaching was good.  Patient was discharged ambulatory.  Discharged with family.  Valuables were given to: patient.    New Prescriptions    No medications on file       "

## 2024-01-03 NOTE — ED Triage Notes (Signed)
"  PT states she slipped down a couple of stairs today at 1030 and is having L foot pain. Denies hitting head.  "

## 2024-01-03 NOTE — ED Provider Notes (Signed)
 "      [facility]    Time of Arrival:   01/03/24 1228        Final diagnoses:   [S93.602A] Sprain of left foot, initial encounter (Primary)       Medical Decision Making:      Differential/Questionable Diagnosis:   Fracture versus sprain                                                  Supplemental Historians Include; : patient     ED Course: History and PE (and data if applicable) most consistent with above diagnosis.  Patient with left lateral foot injury yesterday   radiographs unrevealing for any obvious displaced fracture    postop shoe crutches will follow-up with podiatry        Low suspicion for compartment syndrome, DVT, arterial occulusion/thrombus or infectious etiology. I have low suspicion for aneurysmal disease or pseudoaneurysm at this time.  Doubt osteo   Extremity is neurovascularly intact                                  Documentation/Prior Results Review : See ED Course above, Old medical records:   , Nursing notes    Rhythm interpretations from Monitor by me; : N/A    Image interpretations by me -  : X-Ray neg    FOOT COMPLETE LT   ED Interpretation   Negative for acute process  Juliene ONEIDA Favia, PA                      .     Social drivers : social factors reviewed, did not limit treatment     Discussion of Management with other Physicians, QHP or Appropriate Source; : None    .    Disposition:  Home    New Prescriptions    No medications on file     Chief Complaint   Patient presents with    FOOT INJURY       [name] [name] is a 38 y.o. female yesterday missed 2 steps and twisted her left foot.  Pain to the lateral left foot since.  No ankle pain no other complaints able to bear some weight                Review of Systems:  Constitutional: negative.    Respiratory: negative.     Cardiovascular: negative.    Gastrointestinal: negative.    Neurological:  Negative for numbness.       Physical Exam  Vitals and nursing note reviewed.   Constitutional:       General: She  is not in acute distress.     Appearance: Normal appearance. She is not ill-appearing, toxic-appearing or diaphoretic.   HENT:      Head: Normocephalic.   Eyes:      Conjunctiva/sclera: Conjunctivae normal.   Cardiovascular:      Rate and Rhythm: Normal rate.   Pulmonary:      Effort: Pulmonary effort is normal.   Musculoskeletal:      Cervical back: Normal range of motion.        Legs:       Comments: Some tenderness noted to the left lateral foot no swelling  no deformity left ankle joint calf and shin unremarkable good cap refill good pulses   Skin:     General: Skin is warm and dry.   Neurological:      General: No focal deficit present.      Mental Status: She is alert.   Psychiatric:         Mood and Affect: Mood normal.       Past Medical History:   Diagnosis Date    Acute kidney injury 12/09/2021    Anemia     Asthma     in high school    Back pain     Benign hypertension 07/03/2014    Cyclical vomiting     Delivery outcome of single stillborn infant     Diabetes mellitus (HCC)     on Insulin  pump w/Dexcom G6 sensor    DKA (diabetic ketoacidosis) (HCC) 08/2021    hospitalized    Exercise tolerance finding 11/25/2021    Denies CP or SOB up 2 flights of stairs    Gastroparesis     gastric stimulator implant at [facility] in 2017    Leg pain     Hip gets cortisone shots    Marijuana use     Pregnancy 09/21/2020    Premature birth     Tattoo      Past Surgical History:   Procedure Laterality Date    CARPAL TUNNEL RELEASE Right 06/12/2021    Procedure: NEUROPLASTY, NERVE, MEDIAN, AT CARPAL TUNNEL;  Surgeon: McGuigan, John James, MD    CESAREAN SECTION  2007    x1    DEQUERVAIN PROCEDURE Right 03/03/2012    Procedure: TENDON SHEATH INCISION EXTENSOR WRIST;  Surgeon: Davlin, Lance B, MD;  Location: [facility] OR LOC;  Service: Orthopedics;  Laterality: Right;  Right Wrist First Dorsal Compartment Release      EGD  06/07/2008         EGD N/A 04/30/2015    Procedure: ESOPHAGOGASTRODUODENOSCOPY FLEXIBLE TRANSORAL  DIAGNOSTIC;  Surgeon: Alverda Bring, MD    EXCISION TUMOR Right 11/27/2021    Procedure: EXCISION, MASS, HAND;  Surgeon: Vashti Norleen Agent, MD    OTHER  06/11/2015    gastric stimulator implant at [facility]      Family History   Problem Relation Age of Onset    Cancer Mother     Diabetes Father     Cancer Maternal Grandmother     Heart Failure Maternal Grandfather     Hypertension Maternal Grandfather     Diabetes Maternal Aunt      Social History     Occupational History    Occupation: UNEMPLOYED     Employer: [employer]UNEMPL[employer]ED   Tobacco Use    Smoking status: Never    Smokeless tobacco: Never   Vaping Use    Vaping status: Never Used   Substance and Sexual Activity    Alcohol use: No     Alcohol/week: 0.0 standard drinks of alcohol    Drug use: Not Currently     Types: Marijuana     Comment: marijuana in past    Sexual activity: Not on file     No outpatient medications have been marked as taking for the 01/03/24 encounter [facility] Encounter).     Allergies   Allergen Reactions    Reglan  [Metoclopramide ] anaphylaxis/angioedema    Robaxin [Methocarbamol] neurological reaction     Leg spasms    Zithromax [Azithromycin] hives    Morphine  other/intolerance     Itching,  minimal     Penicillins hives       Vital Signs:  Patient Vitals for the past 72 hrs:   Temp Heart Rate Resp BP BP Mean SpO2 Weight   01/03/24 1250 97.6 F (36.4 C) 85 16 135/79 98 MM HG 99 % 57.6 kg (127 lb)       Diagnostics:  Labs:  No results found for this visit on 01/03/24.  ECG:  No results found for this visit on 01/03/24.         Medications ordered/given in the ED  Medications - No data to display        "

## 2024-01-04 ENCOUNTER — Inpatient Hospital Stay
Admit: 2024-01-04 | Discharge: 2024-01-05 | Disposition: A | Discharge status: Home / Self Care | Payer: MEDICARE | Arrived: VH | Attending: Emergency Medicine

## 2024-01-04 DIAGNOSIS — R1013 Epigastric pain: Secondary | ICD-10-CM

## 2024-01-04 DIAGNOSIS — R112 Nausea with vomiting, unspecified: Principal | ICD-10-CM

## 2024-01-04 LAB — LIPASE: Lipase: 27 U/L (ref 12–53)

## 2024-01-04 LAB — COMPREHENSIVE METABOLIC PANEL
ALT: 11 U/L (ref 10–49)
AST: 33 U/L (ref 0.0–33.9)
Albumin: 4.4 g/dL (ref 3.4–5.0)
Alkaline Phosphatase: 92 U/L (ref 46–116)
Anion Gap: 14 mmol/L (ref 5–15)
BUN: 11 mg/dL (ref 9–23)
CO2: 19 meq/L — ABNORMAL LOW (ref 20–31)
Calcium: 9.8 mg/dL (ref 8.7–10.4)
Chloride: 104 meq/L (ref 98–107)
Creatinine: 1.33 mg/dL — ABNORMAL HIGH (ref 0.55–1.02)
GFR African American: 57
GFR Non-African American: 47
Glucose: 217 mg/dL — ABNORMAL HIGH (ref 74–106)
Potassium: 4 meq/L (ref 3.5–5.1)
Sodium: 137 meq/L (ref 136–145)
Total Bilirubin: 1 mg/dL (ref 0.30–1.20)
Total Protein: 8.3 g/dL — ABNORMAL HIGH (ref 5.7–8.2)

## 2024-01-04 MED ORDER — SODIUM CHLORIDE 0.9 % IV BOLUS
0.9 | Freq: Once | INTRAVENOUS | Status: AC
Start: 2024-01-04 — End: 2024-01-04
  Administered 2024-01-04: 23:00:00 1000 mL via INTRAVENOUS

## 2024-01-04 MED ORDER — SODIUM CHLORIDE (PF) 0.9 % IJ SOLN
0.9 | INTRAMUSCULAR | Status: AC
Start: 2024-01-04 — End: 2024-01-04
  Administered 2024-01-04: 23:00:00 20 mg via INTRAVENOUS

## 2024-01-04 MED ORDER — LABETALOL HCL 5 MG/ML IV SOLN
5 | INTRAVENOUS | Status: AC
Start: 2024-01-04 — End: 2024-01-04
  Administered 2024-01-05: 10 mg via INTRAVENOUS

## 2024-01-04 MED ORDER — ONDANSETRON HCL 4 MG/2ML IJ SOLN
4 | Freq: Once | INTRAMUSCULAR | Status: DC
Start: 2024-01-04 — End: 2024-01-04

## 2024-01-04 MED FILL — FAMOTIDINE (PF) 20 MG/2ML IV SOLN: 20 MG/2ML | INTRAVENOUS | Qty: 2

## 2024-01-04 MED FILL — LABETALOL HCL 5 MG/ML IV SOLN: 5 mg/mL | INTRAVENOUS | Qty: 4

## 2024-01-04 MED FILL — ONDANSETRON HCL 4 MG/2ML IJ SOLN: 4 MG/2ML | INTRAMUSCULAR | Qty: 2

## 2024-01-04 NOTE — ED Triage Notes (Addendum)
"  Pt ambulatory to ED with c/o epigastric pain and vomiting. States her symptoms began yesterday, pains are intermittent with vomiting and sharp. Not trying anything for symptoms      "

## 2024-01-04 NOTE — ED Notes (Signed)
"  CHANGE OF SHIFT REPORT:    Verbal report given to [facility], RN(name) on [name] [name]  for patient in ED observation.    Report consisted of patients Situation, Background, Assessment and   Recommendations(SBAR).     Pending and anticipated orders reviewed with receiving nurse.    Lines:   Peripheral IV 01/04/24 Right Wrist (Active)   Site Assessment Clean, dry & intact 01/04/24 1820   Line Status Blood return noted;Flushed 01/04/24 1820   Phlebitis Assessment No symptoms 01/04/24 1820   Infiltration Assessment 0 01/04/24 1820   Dressing Status New dressing applied;Clean, dry & intact 01/04/24 1820   Dressing Type Transparent 01/04/24 1820   Dressing Intervention New 01/04/24 1820        Opportunity for questions and clarification was provided.      Time in observation (hours/minutes):       Trudy Shelby BROCKS, RN  01/04/24 1924    "

## 2024-01-04 NOTE — ED Notes (Signed)
"  Report given to Aamir, RN     Rilla Francis HERO, RN  01/04/24 1850    "

## 2024-01-04 NOTE — ED Notes (Signed)
"                                                                   POST FALL MANAGEMENT    [name] [name]  MEDICAL RECORD NUMBER:  [id number]  AGE: 38 y.o.   GENDER: female  DOB: 02/03/1985  TODAYS DATE:  01/04/2024    Details     Fall Occurred: Yes    Was the Fall Witnessed:  No      Brief Review of Event: [facility] ED Tech escorted pt to restroom in triage via ED wheelchair. [facility] ED tech stepped out of restroom and heard a thump, found pt laying on bathroom floor. Per [facility], pt not responding to her voice. [facility] ED tech notified this RN. We both went into restroom, pt laying on floor with hands over head, eyes open, pt able to speak to this RN. Asked pt what happened, pt stated I was dizzy. Asked when pt became dizzy. Pt stated I've been dizzy. Pt had not discussed being dizzy with staff.         Who found the patient: [facility] ED tech      Where was the patient at the time of the fall: triage restroom      Patient Comments: reported feeling dizzy after the fall       Date Fall Occurred:  January 04, 2024 .       Time Fall Occurred: 5:40p.m.     Assessment     Post Fall Head to Toe Assessment Completed: No: pt moved to [facility]    Post Fall Predictive Analytic Score Reviewed: No:      Post Fall Vitals Completed: Yes- completed by [facility]    Post Fall Neuro Checks Completed: Yes- completed by [facility]    Injury Occurred(if yes, describe injury):  no           Did the Patient Experience:(Check Oneil all that apply)    []  Patient hit head  []  Loss of consciousness  []  Change in mental status following the fall  []  Patient is on an anticoagulant medication      CT Performed:  no    Follow-up     Persons Notified of Fall:  (Provide names of persons notified)   []  Physician:   [x]  APP:  []  Nursing Supervisior:  []  Manager:  []  Pharmacist:  []  Family:  [x]  Other: Hailey CN      Electronically signed by LIONEL CHRISTELLA SALISBURY, RN 01/04/2024 at 5:52 PM      Akasha Melena, Lionel CHRISTELLA, RN  01/04/24 1758    "

## 2024-01-04 NOTE — Consults (Signed)
"  Placed PIV successfully with ultrasound guidance.    Please see LDA flowsheet.    Thank you.  PICC Team   "

## 2024-01-04 NOTE — ED Notes (Signed)
"  This RN was notified by [facility] ED tech that pt fell in bathroom. This RN entered bathroom and found pt laying on floor with hands over head. Asked what happened, pt stated I got dizzy. Pt had not expressed dizziness to staff throughout interactions. Assisted pt up to chair, notified Hailey CN     Audery Lionel HERO, RN  01/04/24 1743    "

## 2024-01-04 NOTE — ED Notes (Addendum)
"  Report received from Aamir, patient in bed, vascular access team at bedside.      Chalmer Rogue, RN  01/04/24 1952       Chalmer Rogue, RN  01/04/24 2014    "

## 2024-01-04 NOTE — Discharge Instructions (Addendum)
"  Please follow-up with your primary care physician to discuss your visit to the emergency department today.    Return to the emergency department at any point with any worsening or concerning symptoms as we discussed.    Take Zofran  as needed for nausea/vomiting. You received a dose of this medication in the emergency department. Do not take another dose for at least 6-8 hours.     Results for orders placed or performed during the hospital encounter of 01/04/24   CMP   Result Value Ref Range    Potassium 4.0 3.5 - 5.1 mEq/L    Chloride 104 98 - 107 mEq/L    Sodium 137 136 - 145 mEq/L    CO2 19 (L) 20 - 31 mEq/L    Glucose 217 (H) 74 - 106 mg/dl    BUN 11 9 - 23 mg/dl    Creatinine 8.66 (H) 0.55 - 1.02 mg/dl    GFR African American 57.0      GFR Non-African American 47      Calcium  9.8 8.7 - 10.4 mg/dl    Anion Gap 14 5 - 15 mmol/L    AST 33.0 0.0 - 33.9 U/L    ALT 11 10 - 49 U/L    Alkaline Phosphatase 92 46 - 116 U/L    Total Bilirubin 1.00 0.30 - 1.20 mg/dl    Total Protein 8.3 (H) 5.7 - 8.2 gm/dl    Albumin  4.4 3.4 - 5.0 gm/dl   Lipase   Result Value Ref Range    Lipase 27 12 - 53 U/L   CBC with Auto Differential   Result Value Ref Range    WBC 14.4 (H) 4.0 - 11.0 1000/mm3    RBC 4.64 3.60 - 5.20 M/uL    Hemoglobin 13.3 11.0 - 16.0 gm/dl    Hematocrit 60.4 64.9 - 47.0 %    MCV 85.1 80.0 - 98.0 fL    MCH 28.7 25.4 - 34.6 pg    MCHC 33.7 30.0 - 36.0 gm/dl    Platelets 707 859 - 450 1000/mm3    MPV 12.2 (H) 6.0 - 10.0 fL    RDW 42.5 36.4 - 46.3      Nucleated RBCs 0 0 - 0      Immature Granulocytes % 0.3 0.0 - 3.0 %    Neutrophils Segmented 89.9 (H) 34 - 64 %    Lymphocytes 7.7 (L) 28 - 48 %    Monocytes 1.9 1 - 13 %    Eosinophils 0.0 0 - 5 %    Basophils 0.2 0 - 3 %   POCT Urinalysis no Micro   Result Value Ref Range    Glucose, Ur 500 (A) NEGATIVE,Negative mg/dl    Bilirubin, Urine Negative NEGATIVE,Negative      Ketones, Urine 80 (A) NEGATIVE,Negative mg/dl    Specific Gravity, UA 1.025 1.005 - 1.030      Blood,  Urine Small (A) NEGATIVE,Negative      pH, Urine 6.5 5 - 9      Protein, Urine >=300 (A) NEGATIVE,Negative mg/dl    Urobilinogen, Urine 0.2 0.0 - 1.0 EU/dl    Nitrite, Urine Negative NEGATIVE,Negative      Leukocyte Esterase, Urine Negative NEGATIVE,Negative      Color, UA Yellow      Clarity, UA Clear     POC Pregnancy Urine Qual   Result Value Ref Range    Pregnancy, Urine negative NEGATIVE,Negative,negative         "

## 2024-01-04 NOTE — ED Notes (Signed)
"  PA at bedside to assess pt post fall     Rilla Francis HERO, RN  01/04/24 1759    "

## 2024-01-04 NOTE — ED Provider Notes (Signed)
 " [facility]  Emergency Department Treatment Report    Patient: [name] [name] Age: 38 y.o. Sex: female    Date of Birth: 1985/11/23 Admit Date: 01/04/2024 PCP: Rosi Josette DEL, MD   MRN: [id number]  CSN: [id number]  ATTENDING: Maranda Evalene GORMAN, MD    Room: ER11/ER11 Time Dictated: 8:32 PM APP: Alonzo Lazar, PA-C     Chief Complaint   Chief Complaint   Patient presents with    Abdominal Pain    Vomiting       History of Present Illness   38 y.o. female with history of DM type I, gastroparesis, cyclic vomiting syndrome, hypertension, who presents to the emergency department for evaluation of nausea, and multiple episodes of non bloody vomiting that began this morning. Patient states she has been unable to tolerate any fluids, medications, or food due to her symptoms.  Reports being on carvedilol  12.5 mg and amlodipine  10 mg daily.  She reports epigastric abdominal pain that is a sharp/burning sensation and nonradiating.  Denies any recent fevers, chills, cough, shortness of breath, chest pain, diarrhea, constipation, headaches, dizziness, or irritative voiding symptoms.  Patient has continuous glucose monitor in place and reports blood sugars in the range of 190.  Endorses marijuana use, with most recent use last week, but denies any alcohol or tobacco use.  Was seen in outside emergency department yesterday for left foot sprain.  She reports continued left foot pain today but denies any associated numbness or tingling.    Review of Systems   See HPI  Review of Systems     Past Medical/Surgical History     Past Medical History:   Diagnosis Date    Cyclical vomiting 07/09/2017    Diabetic ketoacidosis without coma associated with type 1 diabetes mellitus (HCC) 11/05/2015    Drug-seeking behavior     Essential hypertension 03/27/2016    Amlodipine , carvedilol , lisinopril .    Gastroparesis     Insulin  long-term use (HCC) 11/20/2016    Slow transit constipation 08/08/2017    Type 1 diabetes mellitus (HCC)  02/17/2009    Diagnosed at age 6. Had insulin  pump at one time, but developed keloids at needle insertion sites. Was supposed to establish with [facility].     Past Surgical History:   Procedure Laterality Date    CESAREAN SECTION      PR UNLISTED PROCEDURE ABDOMEN PERITONEUM & OMENTUM  05/2015    Gastric stimulator pump remover in 2021    SALPINGECTOMY Bilateral 11/28/2022    LAPAROSCOPIC BILATERAL SALPINGECTOMY performed by Trudy Prentice LABOR, MD at [facility] MAIN OR     Social History     Social History     Socioeconomic History    Marital status: Single   Tobacco Use    Smoking status: Never    Smokeless tobacco: Never   Vaping Use    Vaping status: Never Used   Substance and Sexual Activity    Alcohol use: Not Currently    Drug use: Not Currently     Types: Marijuana Oda)     Social Drivers of Health     Food Insecurity: No Food Insecurity (10/29/2023)    Hunger Vital Sign     Worried About Running Out of Food in the Last Year: Never true     Ran Out of Food in the Last Year: Never true   Transportation Needs: No Transportation Needs (10/29/2023)    PRAPARE - Transportation     Lack of Transportation (  Medical): No     Lack of Transportation (Non-Medical): No   Intimate Partner Violence: Not At Risk (01/09/2023)    Received from [facility]    Humiliation, Afraid, Rape, and Kick questionnaire     Within the last year, have you been afraid of your partner or ex-partner?: No     Within the last year, have you been humiliated or emotionally abused in other ways by your partner or ex-partner?: No     Within the last year, have you been kicked, hit, slapped, or otherwise physically hurt by your partner or ex-partner?: No     Within the last year, have you been raped or forced to have any kind of sexual activity by your partner or ex-partner?: No   Housing Stability: Low Risk  (10/29/2023)    Housing Stability Vital Sign     Unable to Pay for Housing in the Last Year: No     Number of Times Moved in the Last Year: 0      Homeless in the Last Year: No     Family History     Family History   Problem Relation Age of Onset    Stroke Other         great grandmother    Diabetes Father      Current Medications     Previous Medications    AMLODIPINE  (NORVASC ) 10 MG TABLET    Take 1 tablet by mouth daily    ATORVASTATIN  (LIPITOR) 10 MG TABLET    Take 1 tablet by mouth nightly    CARVEDILOL  (COREG ) 12.5 MG TABLET    Take 1 tablet by mouth 2 times daily (with meals)    GLUCAGON  (GVOKE HYPOPEN  2-PACK) 1 MG/0.2ML SOAJ        INSULIN  ASPART, W/NIACINAMIDE, (FIASP PUMPCART SC)    Inject into the skin    INSULIN  DISPOSABLE PUMP (OMNIPOD 5 [id number] PODS GEN 5) MISC    Apply every 2-3 Days.    INSULIN  GLARGINE (LANTUS  SOLOSTAR) 100 UNIT/ML INJECTION PEN    Inject 28 Units into the skin every evening    INSULIN  GLULISINE (APIDRA) 100 UNIT/ML INJECTION PEN    Inject into the skin       Allergies   Toradol  [ketorolac  tromethamine ], Methocarbamol, Azithromycin, and Penicillins    Physical Exam     ED Triage Vitals   BP Girls Systolic BP Percentile Girls Diastolic BP Percentile Boys Systolic BP Percentile Boys Diastolic BP Percentile Temp Temp Source Pulse   01/04/24 1721 -- -- -- -- 01/04/24 1721 01/04/24 1721 01/04/24 1721   (!) 180/109     98.4 F (36.9 C) Oral (!) 110      Respirations SpO2 Height Weight - Scale       01/04/24 1721 01/04/24 1721 01/04/24 1720 01/04/24 1720       20 100 % 1.676 m (5' 6) 54.4 kg (120 lb)           Physical Exam  Vitals and nursing note reviewed.   Constitutional:       General: She is not in acute distress.     Appearance: She is not toxic-appearing.   HENT:      Head: Normocephalic and atraumatic.      Right Ear: No hemotympanum.      Left Ear: No hemotympanum.      Mouth/Throat:      Mouth: Mucous membranes are moist.   Eyes:      Extraocular Movements: Extraocular  movements intact.      Pupils: Pupils are equal, round, and reactive to light.   Cardiovascular:      Rate and Rhythm: Regular rhythm. Tachycardia  present.      Pulses: Normal pulses.      Heart sounds: Normal heart sounds.   Pulmonary:      Effort: Pulmonary effort is normal.      Breath sounds: Normal breath sounds. No wheezing, rhonchi or rales.   Abdominal:      General: There is no distension.      Palpations: Abdomen is soft.      Tenderness: There is abdominal tenderness in the epigastric area. There is no right CVA tenderness, left CVA tenderness, guarding or rebound.   Musculoskeletal:      Cervical back: Normal range of motion. No tenderness.      Right lower leg: No edema.      Left lower leg: No edema.      Comments: No calf tenderness bilaterally. Left foot in post op shoe   Skin:     General: Skin is warm and dry.      Capillary Refill: Capillary refill takes less than 2 seconds.   Neurological:      General: No focal deficit present.      Mental Status: She is alert and oriented to person, place, and time.        Impression and Management Plan   38 year old female with history of hypertension, DM type I, gastroparesis presents to the emergency department for evaluation of nausea and multiple episodes of vomiting since this morning.  On exam, patient appears uncomfortable with elevated blood pressure.  Patient denies any headaches, vision changes, chest pain at this time.  States she was unable to tolerate her blood pressure medications due to her nausea and vomiting.  Has epigastric tenderness to palpation without rebound or guarding.  CBC, CMP, lipase, UA in process prior to my evaluation.  Will treat symptomatically and give dose of labetalol  for the pressure.    DDx: Including but not limited to cyclic vomiting, gastroparesis, viral illness, gastritis, GERD, pancreatitis, cholelithiasis/cystitis dehydration, AKI, electrolyte abnormality    Diagnostic Studies     Results for orders placed or performed during the hospital encounter of 01/04/24   CMP   Result Value Ref Range    Potassium 4.0 3.5 - 5.1 mEq/L    Chloride 104 98 - 107 mEq/L     Sodium 137 136 - 145 mEq/L    CO2 19 (L) 20 - 31 mEq/L    Glucose 217 (H) 74 - 106 mg/dl    BUN 11 9 - 23 mg/dl    Creatinine 8.66 (H) 0.55 - 1.02 mg/dl    GFR African American 57.0      GFR Non-African American 47      Calcium  9.8 8.7 - 10.4 mg/dl    Anion Gap 14 5 - 15 mmol/L    AST 33.0 0.0 - 33.9 U/L    ALT 11 10 - 49 U/L    Alkaline Phosphatase 92 46 - 116 U/L    Total Bilirubin 1.00 0.30 - 1.20 mg/dl    Total Protein 8.3 (H) 5.7 - 8.2 gm/dl    Albumin  4.4 3.4 - 5.0 gm/dl   Lipase   Result Value Ref Range    Lipase 27 12 - 53 U/L   CBC with Auto Differential   Result Value Ref Range    WBC 14.4 (H) 4.0 - 11.0  1000/mm3    RBC 4.64 3.60 - 5.20 M/uL    Hemoglobin 13.3 11.0 - 16.0 gm/dl    Hematocrit 60.4 64.9 - 47.0 %    MCV 85.1 80.0 - 98.0 fL    MCH 28.7 25.4 - 34.6 pg    MCHC 33.7 30.0 - 36.0 gm/dl    Platelets 707 859 - 450 1000/mm3    MPV 12.2 (H) 6.0 - 10.0 fL    RDW 42.5 36.4 - 46.3      Nucleated RBCs 0 0 - 0      Immature Granulocytes % 0.3 0.0 - 3.0 %    Neutrophils Segmented 89.9 (H) 34 - 64 %    Lymphocytes 7.7 (L) 28 - 48 %    Monocytes 1.9 1 - 13 %    Eosinophils 0.0 0 - 5 %    Basophils 0.2 0 - 3 %    Narrative    This order is a replacement of the rejected order with accession number [id number].   IP Consult to Vascular Access Team    Narrative    Jama Elma CROME, RN     01/04/2024  8:16 PM  Placed PIV successfully with ultrasound guidance.    Please see LDA flowsheet.    Thank you.  PICC Team    POCT Urinalysis no Micro   Result Value Ref Range    Glucose, Ur 500 (A) NEGATIVE,Negative mg/dl    Bilirubin, Urine Negative NEGATIVE,Negative      Ketones, Urine 80 (A) NEGATIVE,Negative mg/dl    Specific Gravity, UA 1.025 1.005 - 1.030      Blood, Urine Small (A) NEGATIVE,Negative      pH, Urine 6.5 5 - 9      Protein, Urine >=300 (A) NEGATIVE,Negative mg/dl    Urobilinogen, Urine 0.2 0.0 - 1.0 EU/dl    Nitrite, Urine Negative NEGATIVE,Negative      Leukocyte Esterase, Urine Negative NEGATIVE,Negative       Color, UA Yellow      Clarity, UA Clear     POC Pregnancy Urine Qual   Result Value Ref Range    Pregnancy, Urine negative NEGATIVE,Negative,negative         ECG: Interpreted by myself and Maranda Evalene RAMAN, MD. My interpretation is sinus tachycardia at 103 bpm, PR interval 112, Qtc 495    ED Course/Medical Decision Making         RECORDS REVIEWED:  I reviewed the patient's previous records here at [facility] and available outside facilities and note that patient seen at outside ED yesterday for left foot injury.  X-ray revealed no acute fractures.  Patient advised to follow-up with podiatry.    EXTERNAL RESULTS REVIEWED:      CT Abdomen/Pelvis from 10/23/23  EXAM: CT ABD/PELVIS-IV ONLY     CLINICAL INDICATION/HISTORY: RLQ abdominal pain (Age >= 14y)     COMPARISON: CT abdomen 09/01/2023.     TECHNIQUE:  CT abdomen and pelvis with IV contrast.   All CT scans at this facility are performed using dose optimization technique as appropriate to the performed examination, to include automated exposure control, adjustment of the mA and/or kV according to patient's size (including appropriate matching for site-specific examinations), or use of an iterative reconstruction technique.     FINDINGS:   Lower chest: Negative.     Liver: Hepatic steatosis. Relative hypoattenuation adjacent to the falciform ligament, favored to be focal fatty replacement.     Biliary: Negative.     Pancreas: Negative.  Spleen: Negative.     Adrenal glands: Negative.     Kidneys: Stable likely extrarenal pelvises without evidence of hydronephrosis. No renal calculi. Symmetrically enhancing. Subcentimeter right renal hypodensities, favored to be benign and not require specific follow-up.     Stomach, Small Bowel and Colon: Stomach is unremarkable. Mild colonic stool burden. No small bowel or colonic distention. No pericolonic stranding or bowel wall thickening. Normal appendix.     Pelvic Organs: Negative     Bladder: Negative.     Lymph nodes: No  lymphadenopathy.     Vessels: Unremarkable for age.     Peritoneal Spaces: Trace layering fluid in the anatomic pelvis. No free air.     Body wall: Negative.     Bones: No acute osseous abnormalities. 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     INDEPENDENT HISTORIAN:  History and/or plan development assisted by: Patient    Severe exacerbation or progression of chronic illness: Gastroparesis, DM Type I     Threat to body function without evaluation and management:  GI/GU    SOCIAL DETERMINANTS  impacting Evaluation and Management: Provider availability, stress, health literacy    Comorbidities impacting Evaluation and Management: DM Type I, Gastroparesis, Hypertension     Critical Care Time (if necessary) N/A    Medications   ondansetron  (ZOFRAN ) injection 4 mg (0 mg IntraVENous Held 01/04/24 1907)   sodium chloride  0.9 % bolus 1,000 mL (0 mLs IntraVENous Stopped 01/04/24 2030)   famotidine  (PEPCID ) 20 MG/2ML 20 mg in sodium chloride  (PF) 0.9 % 10 mL injection (20 mg IntraVENous Given 01/04/24 1827)   labetalol  (NORMODYNE ;TRANDATE ) injection 10 mg (10 mg IntraVENous Given 01/04/24 1907)   morphine  (PF) injection 4 mg (4 mg IntraVENous Given 01/04/24 2012)   diphenhydrAMINE  (BENADRYL ) injection 12.5 mg (12.5 mg IntraVENous Given 01/04/24 2018)     1751: Notified by charge nurse that patient had fall in the bathroom while in the emergency department.  On my reassessment of patient, she states she was at the sink and had a sensation of lightheadedness/dizziness the next thing she remembers is being on the ground.  She denies any headache at this time, neck pain, or back pain.  On my reexamination, patient without any midline cervical, thoracic, or lumbar tenderness to palpation.  No focal deficits on exam. She denies any chest pain or shortness of breath. EKG ordered.     Considered workup for pulmonary embolism but patient low risk by Wells criteria.  She continues to deny any chest pain or shortness of breath.  Oxygen saturation  at 100%.    Well's Criteria for Pulmonary Embolism (PE)    Clinical signs and symptoms of deep vein thrombosis (DVT) [No +0 points/Yes +3 points]   PE is #1 diagnosis or equally likely [No +0 points/Yes +3 points]   Heart rate >100 bpm [No +0 points/Yes +1.5 points]   Immobilization at least 3 days OR surgery in the previous 4 weeks [No +0 points/Yes +1.5 points]   Previous, objectively diagnosed PE or DVT [No +0 pointsYes +1.5 points]   Hemoptysis [No +0 points/Yes +1 point]   Malignancy with treatment within 6 months or palliative [No +0 points/Yes +1 point]   TOTAL Score [1.5] points     Three-Tier Model   0-1 Low risk   2-6 Moderate risk   >6 High risk     Two-Tier Model   <=4 PE unlikely (with D-dimer)   >=5 PE likely (with CTA)     The original  Wells study was performed on cohorts where prevalence of PE was high: approximately 30%. Two further emergency department studies validated this tool with a 9.5%-12% PE prevalence.  The largest study demonstrated risk stratification with:  Low score of 0-1 having a 1.3% prevalence.  Moderate score of 2-6 having a 16.2% prevalence.  High score of >6 having a 37.5% prevalence.  The Christopher study divided the Wells scoring system into 2 categories:  A score of 4 or less was defined as PE unlikely and tested with a d-dimer.  A score of 5 or more was defined as PE likely and went straight to CTA  Overall Incidence of PE was 12.1% in the unlikely group vs. 37.1% in the likely group.  If dimer was negative no further testing was performed.  If dimer was positive the patient went to CTA.  20.4% of all patients who went to CTA had a diagnosis of PE.  In the PE unlikely group, those with a negative dimer and discharged to home had an incidence of missed PE on 3 month follow up of 0.5% .    CMP shows creatinine 1.33, CO2 of 19, glucose of 217.  No elevation of LFTs.  Lipase normal.  CBC with leukocytosis of 14.4 but no anemia.  UA with glucose, ketones, and small  amount of blood but no signs of infection.  Pregnancy negative.  Updated patient of these notes.  She reports significant improvement in her symptoms following fluids, Zofran , Pepcid , pain medication and is requesting discharge home at this time.  We discussed the importance of following up with her primary care physician to discuss her visit here today and her visits with the outside emergency department yesterday.  She states she was given podiatry follow-up at outside emergency department yesterday, attempted to call them today, but will attempt to call them tomorrow due to her left foot pain.  On repeat abdominal exam, patient without any significant abdominal tenderness.  Feel she is stable for discharge home with close outpatient follow-up.  Repeat blood pressure while discussing discharge was 156/84.  Provided patient with prescription for Zofran  and we discussed the importance of taking her antihypertensive medications as prescribed.  Return precautions discussed and patient understands she is to return to the emergency department for reevaluation if she develops any worsening or concerning symptoms. Patient agrees with current plan for discharge and all questions were answered.     Final Diagnosis     1. Nausea and vomiting, unspecified vomiting type    2. Epigastric pain    3. Syncope and collapse    4. Elevated blood pressure reading    5. Left foot pain      Disposition   Pt remained stable in the emergency department and was discharged home in stable condition with the instructions above.     Patient advised to return to ED with any concerning or worsening symptoms.       New Prescriptions    ONDANSETRON  (ZOFRAN -ODT) 4 MG DISINTEGRATING TABLET    Take 1 tablet by mouth 3 times daily as needed for Nausea or Vomiting     Alonzo Lazar, MPA, PA-C  January 04, 2024    The patient was personally evaluated by myself and I have spoken to Maranda Evalene RAMAN, MD regarding this patient's care and we discussed  patient's history, clinical presentation, physical exam, and my initial diagnosis and plan for care management.     My signature above authenticates this document and my orders,  the final diagnosis (es), discharge prescription (s), and instructions in the Epic record. If you have any questions please contact [phone].     Nursing notes have been reviewed by the physician/ advanced practice clinician.    Dragon medical dictation software was used for portions of this report. Unintended voice recognition errors may occur.       Reda Alonzo SAUNDERS, [place]  01/04/24 2054    "

## 2024-01-05 LAB — CBC WITH AUTO DIFFERENTIAL
Basophils: 0.2 % (ref 0–3)
Eosinophils: 0 % (ref 0–5)
Hematocrit: 39.5 % (ref 35.0–47.0)
Hemoglobin: 13.3 g/dL (ref 11.0–16.0)
Immature Granulocytes %: 0.3 % (ref 0.0–3.0)
Lymphocytes: 7.7 % — ABNORMAL LOW (ref 28–48)
MCH: 28.7 pg (ref 25.4–34.6)
MCHC: 33.7 g/dL (ref 30.0–36.0)
MCV: 85.1 fL (ref 80.0–98.0)
MPV: 12.2 fL — ABNORMAL HIGH (ref 6.0–10.0)
Monocytes: 1.9 % (ref 1–13)
Neutrophils Segmented: 89.9 % — ABNORMAL HIGH (ref 34–64)
Nucleated RBCs: 0 (ref 0–0)
Platelets: 292 1000/mm3 (ref 140–450)
RBC: 4.64 M/uL (ref 3.60–5.20)
RDW: 42.5 (ref 36.4–46.3)
WBC: 14.4 1000/mm3 — ABNORMAL HIGH (ref 4.0–11.0)

## 2024-01-05 LAB — EKG 12-LEAD
Atrial Rate: 103 {beats}/min
Calculated P Axis: 78 degrees
Calculated R Axis: 75 degrees
Calculated T Axis: 253 degrees
P-R Interval: 112 ms
Q-T Interval: 378 ms
QRS Duration: 84 ms
QTC Calculation (Bezet): 495 ms
Ventricular Rate: 103 {beats}/min

## 2024-01-05 LAB — POCT URINALYSIS DIPSTICK
Bilirubin, Urine: NEGATIVE
Glucose, Ur: 500 mg/dL — AB
Ketones, Urine: 80 mg/dL — AB
Leukocyte Esterase, Urine: NEGATIVE
Nitrite, Urine: NEGATIVE
Protein, Urine: >=300 mg/dL — AB
Specific Gravity, UA: 1.025 (ref 1.005–1.030)
Urobilinogen, Urine: 0.2 EU/dl (ref 0.0–1.0)
pH, Urine: 6.5 (ref 5–9)

## 2024-01-05 LAB — POC PREGNANCY UR-QUAL: Pregnancy, Urine: NEGATIVE

## 2024-01-05 MED ORDER — DIPHENHYDRAMINE HCL 50 MG/ML IJ SOLN
50 | INTRAMUSCULAR | Status: AC
Start: 2024-01-05 — End: 2024-01-04
  Administered 2024-01-05: 01:00:00 12.5 mg via INTRAVENOUS

## 2024-01-05 MED ORDER — ONDANSETRON 4 MG PO TBDP
4 | ORAL_TABLET | Freq: Three times a day (TID) | ORAL | 0 refills | 7.00000 days | Status: DC | PRN
Start: 2024-01-05 — End: 2024-01-24

## 2024-01-05 MED ORDER — MORPHINE SULFATE (PF) 10 MG/ML IJ SOLN
10 | INTRAMUSCULAR | Status: AC
Start: 2024-01-05 — End: 2024-01-04
  Administered 2024-01-05: 01:00:00 4 mg via INTRAVENOUS

## 2024-01-05 MED FILL — MORPHINE SULFATE 10 MG/ML IV SOLN: 10 mg/mL | INTRAVENOUS | Qty: 1

## 2024-01-05 MED FILL — DIPHENHYDRAMINE HCL 50 MG/ML IJ SOLN: 50 mg/mL | INTRAMUSCULAR | Qty: 1

## 2024-01-24 ENCOUNTER — Emergency Department: Admit: 2024-01-25 | Payer: MEDICARE | Primary: Family Medicine

## 2024-01-24 DIAGNOSIS — J209 Acute bronchitis, unspecified: Principal | ICD-10-CM

## 2024-01-24 NOTE — ED Notes (Signed)
"  Introduced self to Pt.   Pt stuck by several staff members for PIV placement.   Placed US  guided PIV right forearm.   Attempted x2.   Pt tolerated well.   Notified RN staff        Rosalina Joesph HERO  01/24/24 2153    "

## 2024-01-24 NOTE — Discharge Instructions (Addendum)
 "Based on our clinical examination in the emergency department today, we feel you are safe for discharge home with the follow up recommendations noted above. You should return to the emergency department if your condition worsens significantly, fails to improve as we discussed, or if you note any new or concerning symptoms.    The following labs were obtained during your emergency department evaluation, which you should bring to your next primary care provider appointment for follow up:    Results for orders placed or performed during the hospital encounter of 01/24/24   COVID-19, Flu A/B, and RSV Combo    Specimen: Nasopharyngeal Swab   Result Value Ref Range    SARS-CoV-2 NEGATIVE NEGATIVE      Influenza A PCR NEGATIVE NEGATIVE      Influenza B by PCR NEGATIVE NEGATIVE      RSV A/B PCR NEGATIVE NEGATIVE     Basic Metabolic Panel   Result Value Ref Range    Potassium 3.8 3.5 - 5.1 mEq/L    Chloride 104 98 - 107 mEq/L    Sodium 136 136 - 145 mEq/L    CO2 22 20 - 31 mEq/L    Glucose 323 (H) 74 - 106 mg/dl    BUN 13 9 - 23 mg/dl    Creatinine 8.42 (H) 0.55 - 1.02 mg/dl    GFR African American 47.0      GFR Non-African American 39      Calcium  9.3 8.7 - 10.4 mg/dl    Anion Gap 10 5 - 15 mmol/L   CBC with Auto Differential   Result Value Ref Range    WBC 11.3 (H) 4.0 - 11.0 1000/mm3    RBC 4.04 3.60 - 5.20 M/uL    Hemoglobin 11.6 11.0 - 16.0 gm/dl    Hematocrit 64.7 64.9 - 47.0 %    MCV 87.1 80.0 - 98.0 fL    MCH 28.7 25.4 - 34.6 pg    MCHC 33.0 30.0 - 36.0 gm/dl    Platelets 705 859 - 450 1000/mm3    MPV 11.6 (H) 6.0 - 10.0 fL    RDW 40.6 36.4 - 46.3      Nucleated RBCs 0 0 - 0      Immature Granulocytes % 0.3 0.0 - 3.0 %    Neutrophils Segmented 83.0 (H) 34 - 64 %    Lymphocytes 9.5 (L) 28 - 48 %    Monocytes 6.4 1 - 13 %    Eosinophils 0.4 0 - 5 %    Basophils 0.4 0 - 3 %   POCT Glucose   Result Value Ref Range    POC Glucose 318 (H) 65 - 105 mg/dL       The following imaging studies were obtained during your  emergency department evaluation, which you should bring to your next primary care provider appointment for follow up:    XR CHEST (2 VW)  Result Date: 01/24/2024  PA and lateral views of the chest, 2 exposures Clinical History: rib pain Comparison: 12/07/2023 Findings: The cardiomediastinal silhouette is unremarkable. The lungs are clear, without evidence of consolidating infiltrate, pleural effusion, or pneumothorax. The osseous structures are unremarkable.     IMPRESSION: No acute cardiopulmonary process. Electronically signed by: Venia Frizzle, MD 01/24/2024 8:08 PM EST          Workstation ID: [id number]     XR - FOOT COMPLETE LT  Result Date: 01/14/2024  EXAM: FOOT COMPLETE LT CLINICAL INDICATION/HISTORY: TRAUMA Additional: None COMPARISON:  Left foot x-ray 01/03/2024 TECHNIQUE: 3 views of the left foot FINDINGS: BONES: Unremarkable. Diffuse osteopenia. SOFT TISSUES: Unremarkable.    1. No acute osseous abnormality. 2. Diffuse osteopenia, atypical for patient's age. Dictated ByBETHA Renato Graham on 01/14/2024 7:03 PM As attending radiologist, I have assessed the study images, and dictated or reviewed/edited the final report as needed. Signed By: Wanda Grise, DO on 01/14/2024 7:21 PM    FOOT COMPLETE LT  Result Date: 01/03/2024  EXAM: LEFT FOOT RADIOGRAPHS CLINICAL INDICATION/HISTORY: Provided with order -   TRAUMA   > Additional: None COMPARISON: None TECHNIQUE: 3 views of the left foot FINDINGS: Decreased bone mineral density. No acute fracture or dislocation. Soft tissues unremarkable.     No acute fracture or dislocation. Bone mineralization appears diffusely decreased, atypical for patient age. Signed By: Marsa Burns, MD on 01/03/2024 1:55 PM      "

## 2024-01-24 NOTE — ED Notes (Signed)
"  Pt seen and treated by provider    IV removed   Discharge papers given and reviewed with patient.     Questions answered.     Pt had no questions for the nurse.    Ambulatory out of department.       Deward Chiquita PARAS, LPN  87/71/74 [id number]    "

## 2024-01-24 NOTE — ED Provider Notes (Signed)
 "[facility]  Emergency Department Treatment Report    Patient: [name] [name] Age: 38 y.o. Sex: female    Date of Birth: 07-20-1985 Admit Date: 01/24/2024 PCP: Rosi Josette DEL, MD   MRN: [id number]  CSN: [id number]     Room: H10/H10 Time Dictated: 8:57 PM      Payor: MEDICARE / Plan: MEDICARE PART A AND B / Product Type: *No Product type* /     Chief Complaint   Chief Complaint   Patient presents with    Fever    Cough       History of Present Illness   This is a 38 y.o. female with a history of cyclic vomiting and gastroparesis, diabetes mellitus type 1, hypertension, previous bouts of diabetic ketoacidosis, and previous concern for drug-seeking behavior who presents with complaint of bilateral rib pain in the setting of coughing.  She reports that she has been coughing throughout the day having awoken with the symptoms.  She denies any measured fever but has had associated subjective fever, fatigue and chills.  She reports that she has cough that has been productive of yellow mucus with pain in the bilateral ribs worsened with episodes of coughing.  She also reports that she has had episodes of posttussive emesis though she denies associated nausea.  She does have a history of gastroparesis and cyclic vomiting but states that she is only vomiting after fits of coughing.  She is complaining of associated back pain and bodyaches.  She has had some associated loss of appetite and generalized fatigue.  She reports that she has had associated headaches and upper back pain.  She denies any recent travel, surgery, or immobility.  She denies any prior history of pulmonary embolism or deep vein fibrosis.  She denies sick contacts at home.    Review of Systems   Review of Systems   Constitutional:  Positive for chills and fatigue. Negative for fever.   HENT:  Negative for congestion and sore throat.    Eyes:  Negative for visual disturbance.   Respiratory:  Positive for cough and shortness of breath.  Negative for wheezing.    Cardiovascular:  Negative for chest pain, palpitations and leg swelling.   Gastrointestinal:  Positive for vomiting. Negative for abdominal pain, blood in stool, constipation, diarrhea and nausea.   Genitourinary:  Negative for dysuria, flank pain, frequency and hematuria.   Musculoskeletal:  Positive for back pain and myalgias. Negative for neck pain.   Skin:  Negative for rash and wound.   Neurological:  Positive for headaches. Negative for dizziness, weakness, light-headedness and numbness.   Hematological:  Does not bruise/bleed easily.   Psychiatric/Behavioral:  Negative for confusion. The patient is nervous/anxious.        Past Medical/Surgical History     Past Medical History:   Diagnosis Date    Cyclical vomiting 07/09/2017    Diabetic ketoacidosis without coma associated with type 1 diabetes mellitus (HCC) 11/05/2015    Drug-seeking behavior     Essential hypertension 03/27/2016    Amlodipine , carvedilol , lisinopril .    Gastroparesis     Insulin  long-term use (HCC) 11/20/2016    Slow transit constipation 08/08/2017    Type 1 diabetes mellitus (HCC) 02/17/2009    Diagnosed at age 80. Had insulin  pump at one time, but developed keloids at needle insertion sites. Was supposed to establish with [facility].     Past Surgical History:   Procedure Laterality Date    CESAREAN SECTION  PR UNLISTED PROCEDURE ABDOMEN PERITONEUM & OMENTUM  05/2015    Gastric stimulator pump remover in 2021    SALPINGECTOMY Bilateral 11/28/2022    LAPAROSCOPIC BILATERAL SALPINGECTOMY performed by Trudy Prentice LABOR, MD at [facility] MAIN OR       Social History     Social History     Socioeconomic History    Marital status: Single   Tobacco Use    Smoking status: Never    Smokeless tobacco: Never   Vaping Use    Vaping status: Never Used   Substance and Sexual Activity    Alcohol use: Not Currently    Drug use: Not Currently     Types: Marijuana Oda)     Social Drivers of Health     Food Insecurity: No Food  Insecurity (10/29/2023)    Hunger Vital Sign     Worried About Running Out of Food in the Last Year: Never true     Ran Out of Food in the Last Year: Never true   Transportation Needs: No Transportation Needs (10/29/2023)    PRAPARE - Therapist, Art (Medical): No     Lack of Transportation (Non-Medical): No   Intimate Partner Violence: Not At Risk (01/09/2023)    Received from [facility]    Humiliation, Afraid, Rape, and Kick questionnaire     Within the last year, have you been afraid of your partner or ex-partner?: No     Within the last year, have you been humiliated or emotionally abused in other ways by your partner or ex-partner?: No     Within the last year, have you been kicked, hit, slapped, or otherwise physically hurt by your partner or ex-partner?: No     Within the last year, have you been raped or forced to have any kind of sexual activity by your partner or ex-partner?: No   Housing Stability: Low Risk  (10/29/2023)    Housing Stability Vital Sign     Unable to Pay for Housing in the Last Year: No     Number of Times Moved in the Last Year: 0     Homeless in the Last Year: No       Family History     Family History   Problem Relation Age of Onset    Stroke Other         great grandmother    Diabetes Father        Current Medications     Current Facility-Administered Medications   Medication Dose Route Frequency Provider Last Rate Last Admin    potassium chloride  20 MEQ/15ML (10%) oral solution 40 mEq  40 mEq Oral Once Joyice Vinie BIRCH, DO        sodium chloride  0.9 % bolus 1,000 mL  1,000 mL IntraVENous Once Joyice Vinie BIRCH, DO        ondansetron  (ZOFRAN ) injection 4 mg  4 mg IntraVENous Once Joyice Vinie D, DO        morphine  (PF) injection 4 mg  4 mg IntraVENous NOW Joyice Vinie BIRCH, DO        aspirin chewable tablet 324 mg  324 mg Oral Once Joyice Vinie BIRCH, DO         Current Outpatient Medications   Medication Sig Dispense Refill    ondansetron   (ZOFRAN -ODT) 4 MG disintegrating tablet Take 1 tablet by mouth 3 times daily as needed for Nausea or Vomiting 21 tablet 0    amLODIPine  (  NORVASC ) 10 MG tablet Take 1 tablet by mouth daily 30 tablet 3    atorvastatin  (LIPITOR) 10 MG tablet Take 1 tablet by mouth nightly 30 tablet 3    carvedilol  (COREG ) 12.5 MG tablet Take 1 tablet by mouth 2 times daily (with meals) 60 tablet 3    Insulin  Disposable Pump (OMNIPOD 5 [id number] PODS GEN 5) MISC Apply every 2-3 Days.      Glucagon  (GVOKE HYPOPEN  2-PACK) 1 MG/0.2ML SOAJ       insulin  glulisine (APIDRA) 100 UNIT/ML injection pen Inject into the skin      Insulin  Aspart, w/Niacinamide, (FIASP PUMPCART SC) Inject into the skin      insulin  glargine (LANTUS  SOLOSTAR) 100 UNIT/ML injection pen Inject 28 Units into the skin every evening         Allergies     Allergies   Allergen Reactions    Toradol  [Ketorolac  Tromethamine ] Swelling     Tongue swelling    Methocarbamol Other (See Comments)     Body shaking, nausea    Azithromycin Hives    Penicillins Hives       Physical Exam     ED Triage Vitals   BP Girls Systolic BP Percentile Girls Diastolic BP Percentile Boys Systolic BP Percentile Boys Diastolic BP Percentile Temp Temp Source Pulse   01/24/24 1950 -- -- -- -- 01/24/24 1950 01/24/24 1950 01/24/24 1950   (!) 181/95     100 F (37.8 C) Oral 97      Respirations SpO2 Height Weight - Scale       01/24/24 1950 01/24/24 1950 -- 01/24/24 2044       16 99 %  57.6 kg (127 lb)         Vitals:    01/24/24 1950 01/24/24 2044   BP: (!) 181/95    Pulse: 97    Resp: 16    Temp: 100 F (37.8 C)    TempSrc: Oral    SpO2: 99%    Weight:  57.6 kg (127 lb)     I have reviewed documented vital signs, which are within age-appropriate parameters.   I have reviewed nursing documentation.    Physical Exam  Vitals and nursing note reviewed.   Constitutional:       General: She is in acute distress (moderate painful distress).      Appearance: She is normal weight.   HENT:      Head: Normocephalic  and atraumatic.      Right Ear: External ear normal.      Left Ear: External ear normal.      Nose: Nose normal.      Mouth/Throat:      Mouth: Mucous membranes are dry.      Pharynx: No oropharyngeal exudate or posterior oropharyngeal erythema.   Eyes:      Conjunctiva/sclera: Conjunctivae normal.   Cardiovascular:      Rate and Rhythm: Normal rate and regular rhythm.      Heart sounds: No murmur heard.  Pulmonary:      Effort: Pulmonary effort is normal. No respiratory distress.      Breath sounds: Normal breath sounds.      Comments: She does have some splinting with deep inspiration in the setting of worsening pain in the bilateral ribs with deep inspiration.  Abdominal:      General: There is no distension.      Palpations: Abdomen is soft.      Tenderness: There is no  abdominal tenderness.   Musculoskeletal:         General: No deformity.      Cervical back: Neck supple. No tenderness.      Right lower leg: No edema.      Left lower leg: No edema.   Skin:     General: Skin is warm and dry.      Findings: No rash.   Neurological:      Mental Status: She is alert and oriented to person, place, and time.      Sensory: No sensory deficit.      Motor: No weakness.   Psychiatric:         Mood and Affect: Mood is anxious.         Behavior: Behavior normal.       Diagnostic Studies   Lab:   Results for orders placed or performed during the hospital encounter of 01/24/24   COVID-19, Flu A/B, and RSV Combo    Specimen: Nasopharyngeal Swab   Result Value Ref Range    SARS-CoV-2 NEGATIVE NEGATIVE      Influenza A PCR NEGATIVE NEGATIVE      Influenza B by PCR NEGATIVE NEGATIVE      RSV A/B PCR NEGATIVE NEGATIVE     Basic Metabolic Panel   Result Value Ref Range    Potassium 3.8 3.5 - 5.1 mEq/L    Chloride 104 98 - 107 mEq/L    Sodium 136 136 - 145 mEq/L    CO2 22 20 - 31 mEq/L    Glucose 323 (H) 74 - 106 mg/dl    BUN 13 9 - 23 mg/dl    Creatinine 8.42 (H) 0.55 - 1.02 mg/dl    GFR African American 47.0      GFR Non-African  American 39      Calcium  9.3 8.7 - 10.4 mg/dl    Anion Gap 10 5 - 15 mmol/L   CBC with Auto Differential   Result Value Ref Range    WBC 11.3 (H) 4.0 - 11.0 1000/mm3    RBC 4.04 3.60 - 5.20 M/uL    Hemoglobin 11.6 11.0 - 16.0 gm/dl    Hematocrit 64.7 64.9 - 47.0 %    MCV 87.1 80.0 - 98.0 fL    MCH 28.7 25.4 - 34.6 pg    MCHC 33.0 30.0 - 36.0 gm/dl    Platelets 705 859 - 450 1000/mm3    MPV 11.6 (H) 6.0 - 10.0 fL    RDW 40.6 36.4 - 46.3      Nucleated RBCs 0 0 - 0      Immature Granulocytes % 0.3 0.0 - 3.0 %    Neutrophils Segmented 83.0 (H) 34 - 64 %    Lymphocytes 9.5 (L) 28 - 48 %    Monocytes 6.4 1 - 13 %    Eosinophils 0.4 0 - 5 %    Basophils 0.4 0 - 3 %   POCT Glucose   Result Value Ref Range    POC Glucose 318 (H) 65 - 105 mg/dL     Based on my interpretation the CBC is notable for mild leukocytosis without other significant acute abnormalities.  Based on my interpretation the BMP shows hyperglycemia and elevation of the creatinine consistent with dehydration in a patient with known diabetes without other significant acute abnormalities.  Based on my interpretation the respiratory viral panel from today is noted to be negative for COVID-19, influenza and respiratory syncytial virus (RSV).  I have reviewed all ordered labs and used them in my medical decision making (MDM) as documented below.    Imaging:    XR CHEST (2 VW)  Result Date: 01/24/2024  PA and lateral views of the chest, 2 exposures Clinical History: rib pain Comparison: 12/07/2023 Findings: The cardiomediastinal silhouette is unremarkable. The lungs are clear, without evidence of consolidating infiltrate, pleural effusion, or pneumothorax. The osseous structures are unremarkable.     IMPRESSION: No acute cardiopulmonary process. Electronically signed by: Venia Frizzle, MD 01/24/2024 8:08 PM EST          Workstation ID: [id number]     Based on my interpretation the chest x-ray shows no acute consolidation suggestive of pneumonia or other evidence  of significant pulmonary congestion or obvious rib fractures.    I have reviewed all ordered imaging studies and used them in my medical decision making (MDM) as documented below.    ED Course     RECORDS REVIEWED: I reviewed the patient's previous records here at [facility] and available outside facilities and note that the patient does have a history of chronic nausea and vomiting from cyclic vomiting.  Patient does have some previous records stating that there is some concern for drug-seeking behavior.  The patient does not have any clear evidence of prior episodes of deep vein that versus a pulmonary embolism on review of records.     EXTERNAL RESULTS REVIEWED: I reviewed the Prescription Drug Monitoring Program (PDMP) to help guide appropriate use of opioid pain medications in the treatment of this patient.     Comorbidities impacting Evaluation and Management: History of diabetes mellitus and gastroparesis    Medical Decision Making   Chief Complaint: Fever, Cough     38 y.o. female with subjective fever, cough, and chest discomfort with history and exam consistent with chest wall pain likely secondary to rib spasm in the setting of coughing in setting of likely viral infection causing acute bronchitis.    Initial considerations in this patient included COVID-19, influenza, pneumonia, bronchitis, chest wall pain, acute coronary syndrome, pulmonary embolism, rib fractures, rib contusion, dehydration, acute chronic kidney injury, and other metabolic and infectious etiologies among others.    Patient presents with chest discomfort in the lateral chest with coughing.  She has had upper respiratory symptoms consistent with a viral infection.  She had a viral panel that was ultimately noted to be negative for COVID-19 and influenza.  She has had the symptoms only over the past 24 hours and we felt that she still had findings that would be consistent with possible underlying viral process.  The patient was not noted to have  any wheezing or significant shortness of breath in the ED.  She does describe symptoms of cough and posttussive emesis that is felt to be likely secondary to some degree of acute bronchitis.  Patient was noted to have a history of gastroparesis but did not have similar vomiting and only reported vomiting with episodes of coughing fits.  The patient was noted to have otherwise unremarkable labs including a viral panel that was negative for viruses tested.  Her chest x-ray showed no evidence of underlying pulmonary congestion or acute consolidations.  She was noted to have significant improvement with symptomatic treatment in the ED.    As part of the workup, I considered obtaining additional cardiac workup but felt the patient did not have significant risk factors to suggest cardiac etiology for the symptoms.  We do feel that there is some  possibility that the patient might still have a viral infection and even influenza with testing falsely negative in the setting of early onset of symptoms.  We discussed the appropriate follow-up but did not feel that the patient would have any indications for admission or clear risk factors to suggest need for antiviral medications for influenza at this time.    Prior to discharge we discussed return precautions, specifically for worsening chest pain or shortness of breath, treatment with pain medications, corticosteroids, and bronchodilators, and follow up with primary care doctor for further evaluation, and the patient demonstrated understanding and agreement with this plan.    Medications   potassium chloride  20 MEQ/15ML (10%) oral solution 40 mEq (has no administration in time range)   sodium chloride  0.9 % bolus 1,000 mL (has no administration in time range)   ondansetron  (ZOFRAN ) injection 4 mg (has no administration in time range)   morphine  (PF) injection 4 mg (has no administration in time range)   aspirin chewable tablet 324 mg (has no administration in time range)        Final Diagnosis     1. Acute bronchitis, unspecified organism    2. Chest wall pain    3. Post-tussive emesis    4. Dehydration        Disposition   Discharged home with recommendation for close outpatient follow-up.      Rosi Josette DEL, MD  [address]  [address]    [place] [address]  [phone]    In 1 week         Vinie Hill, DO  January 24, 2024    My signature above authenticates this document and my orders, the final    diagnosis (es), discharge prescription (s), and instructions in the Epic    record.    Please note that times documented above may not reflect the actual time interventions were performed.  In most cases, charting is completed as soon as possible after the intervention or update.  Every effort is made to back time the intervention, though this is not always possible, and does lead to the possibility that these times can be off slightly.  Furthermore, not all clocks in the department are set to the exact same time.  Additional efforts are made in the final medical decision making summary to identify key orders in the order in which they occurred.    If you have any questions please contact [phone].     Nursing notes have been reviewed by the physician/ advanced practice    Clinician.    Dragon medical dictation software was used for portions of this report. Unintended voice recognition grammatical errors may occur.        Hill Vinie BIRCH, DO  01/24/24 2252    "

## 2024-01-24 NOTE — ED Triage Notes (Addendum)
"  Pt arrives ambulatory to triage with c/o cough/fever X1 day. Feels like she coughed and her rib is fractured.       Past Medical History:   Diagnosis Date    Cyclical vomiting 07/09/2017    Diabetic ketoacidosis without coma associated with type 1 diabetes mellitus (HCC) 11/05/2015    Drug-seeking behavior     Essential hypertension 03/27/2016    Amlodipine , carvedilol , lisinopril .    Gastroparesis     Insulin  long-term use (HCC) 11/20/2016    Slow transit constipation 08/08/2017    Type 1 diabetes mellitus (HCC) 02/17/2009    Diagnosed at age 38. Had insulin  pump at one time, but developed keloids at needle insertion sites. Was supposed to establish with [facility].       "

## 2024-01-25 ENCOUNTER — Inpatient Hospital Stay
Admit: 2024-01-25 | Discharge: 2024-01-25 | Disposition: A | Discharge status: Home / Self Care | Payer: MEDICARE | Arrived: VH | Attending: Student in an Organized Health Care Education/Training Program

## 2024-01-25 LAB — CBC WITH AUTO DIFFERENTIAL
Basophils: 0.4 % (ref 0–3)
Eosinophils: 0.4 % (ref 0–5)
Hematocrit: 35.2 % (ref 35.0–47.0)
Hemoglobin: 11.6 g/dL (ref 11.0–16.0)
Immature Granulocytes %: 0.3 % (ref 0.0–3.0)
Lymphocytes: 9.5 % — ABNORMAL LOW (ref 28–48)
MCH: 28.7 pg (ref 25.4–34.6)
MCHC: 33 g/dL (ref 30.0–36.0)
MCV: 87.1 fL (ref 80.0–98.0)
MPV: 11.6 fL — ABNORMAL HIGH (ref 6.0–10.0)
Monocytes: 6.4 % (ref 1–13)
Neutrophils Segmented: 83 % — ABNORMAL HIGH (ref 34–64)
Nucleated RBCs: 0 (ref 0–0)
Platelets: 294 1000/mm3 (ref 140–450)
RBC: 4.04 M/uL (ref 3.60–5.20)
RDW: 40.6 (ref 36.4–46.3)
WBC: 11.3 1000/mm3 — ABNORMAL HIGH (ref 4.0–11.0)

## 2024-01-25 LAB — BASIC METABOLIC PANEL
Anion Gap: 10 mmol/L (ref 5–15)
BUN: 13 mg/dL (ref 9–23)
CO2: 22 meq/L (ref 20–31)
Calcium: 9.3 mg/dL (ref 8.7–10.4)
Chloride: 104 meq/L (ref 98–107)
Creatinine: 1.57 mg/dL — ABNORMAL HIGH (ref 0.55–1.02)
GFR African American: 47
GFR Non-African American: 39
Glucose: 323 mg/dL — ABNORMAL HIGH (ref 74–106)
Potassium: 3.8 meq/L (ref 3.5–5.1)
Sodium: 136 meq/L (ref 136–145)

## 2024-01-25 LAB — COVID-19, FLU A/B, AND RSV COMBO
Influenza A PCR: NEGATIVE
Influenza B by PCR: NEGATIVE
RSV A/B PCR: NEGATIVE
SARS-CoV-2: NEGATIVE

## 2024-01-25 LAB — POCT GLUCOSE: POC Glucose: 318 mg/dL — ABNORMAL HIGH (ref 65–105)

## 2024-01-25 MED ORDER — CYCLOBENZAPRINE HCL 5 MG PO TABS
5 | ORAL_TABLET | Freq: Two times a day (BID) | ORAL | 0 refills | 10.00000 days | Status: AC | PRN
Start: 2024-01-25 — End: 2024-02-03

## 2024-01-25 MED ORDER — BENZONATATE 100 MG PO CAPS
100 | ORAL_CAPSULE | Freq: Two times a day (BID) | ORAL | 0 refills | 10.00000 days | Status: AC | PRN
Start: 2024-01-25 — End: 2024-01-31

## 2024-01-25 MED ORDER — ONDANSETRON 4 MG PO TBDP
4 | ORAL_TABLET | Freq: Three times a day (TID) | ORAL | 0 refills | 7.00000 days | Status: AC | PRN
Start: 2024-01-25 — End: ?

## 2024-01-25 MED ORDER — PREDNISONE 50 MG PO TABS
50 | ORAL_TABLET | Freq: Every day | ORAL | 0 refills | 7.00000 days | Status: AC
Start: 2024-01-25 — End: 2024-01-29

## 2024-01-25 MED ORDER — HYDROCODONE-ACETAMINOPHEN 5-325 MG PO TABS
5-325 | ORAL_TABLET | Freq: Four times a day (QID) | ORAL | 0 refills | 4.50000 days | Status: AC | PRN
Start: 2024-01-25 — End: 2024-01-27

## 2024-01-25 MED ORDER — SODIUM CHLORIDE 0.9 % IV BOLUS
0.9 | Freq: Once | INTRAVENOUS | Status: AC
Start: 2024-01-25 — End: 2024-01-24
  Administered 2024-01-25: 03:00:00 1000 mL via INTRAVENOUS

## 2024-01-25 MED ORDER — HYDROCODONE-ACETAMINOPHEN 5-325 MG PO TABS
5-325 | ORAL | Status: DC
Start: 2024-01-25 — End: 2024-01-25

## 2024-01-25 MED ORDER — ASPIRIN 81 MG PO CHEW
81 | Freq: Once | ORAL | Status: DC
Start: 2024-01-25 — End: 2024-01-24

## 2024-01-25 MED ORDER — ONDANSETRON HCL 4 MG/2ML IJ SOLN
4 | Freq: Once | INTRAMUSCULAR | Status: DC
Start: 2024-01-25 — End: 2024-01-24

## 2024-01-25 MED ORDER — POTASSIUM CHLORIDE 20 MEQ/15ML (10%) PO SOLN
20 | Freq: Once | ORAL | Status: DC
Start: 2024-01-25 — End: 2024-01-24

## 2024-01-25 MED ORDER — MORPHINE SULFATE (PF) 10 MG/ML IJ SOLN
10 | INTRAMUSCULAR | Status: AC
Start: 2024-01-25 — End: 2024-01-24
  Administered 2024-01-25: 03:00:00 4 mg via INTRAVENOUS

## 2024-01-25 MED ORDER — ONDANSETRON HCL 4 MG/2ML IJ SOLN
4 | Freq: Once | INTRAMUSCULAR | Status: AC
Start: 2024-01-25 — End: 2024-01-24
  Administered 2024-01-25: 03:00:00 4 mg via INTRAVENOUS

## 2024-01-25 MED ORDER — DIPHENHYDRAMINE HCL 50 MG/ML IJ SOLN
50 | INTRAMUSCULAR | Status: AC
Start: 2024-01-25 — End: 2024-01-24
  Administered 2024-01-25: 03:00:00 50 mg via INTRAVENOUS

## 2024-01-25 MED ORDER — ALBUTEROL SULFATE HFA 108 (90 BASE) MCG/ACT IN AERS
108 | Freq: Four times a day (QID) | RESPIRATORY_TRACT | 3 refills | 25.00000 days | Status: AC | PRN
Start: 2024-01-25 — End: ?

## 2024-01-25 MED ORDER — CYCLOBENZAPRINE HCL 10 MG PO TABS
10 | ORAL | Status: AC
Start: 2024-01-25 — End: 2024-01-24
  Administered 2024-01-25: 03:00:00 10 mg via ORAL

## 2024-01-25 MED ORDER — SODIUM CHLORIDE 0.9 % IV BOLUS
0.9 | Freq: Once | INTRAVENOUS | Status: DC
Start: 2024-01-25 — End: 2024-01-24

## 2024-01-25 MED ORDER — MORPHINE SULFATE (PF) 10 MG/ML IJ SOLN
10 | INTRAMUSCULAR | Status: DC
Start: 2024-01-25 — End: 2024-01-24

## 2024-01-25 MED FILL — DIPHENHYDRAMINE HCL 50 MG/ML IJ SOLN: 50 mg/mL | INTRAMUSCULAR | Qty: 1 | Fill #0

## 2024-01-25 MED FILL — ONDANSETRON HCL 4 MG/2ML IJ SOLN: 4 MG/2ML | INTRAMUSCULAR | Qty: 2 | Fill #0

## 2024-01-25 MED FILL — CYCLOBENZAPRINE HCL 10 MG PO TABS: 10 mg | ORAL | Qty: 1 | Fill #0

## 2024-01-25 MED FILL — MORPHINE SULFATE 10 MG/ML IV SOLN: 10 mg/mL | INTRAVENOUS | Qty: 1 | Fill #0
